# Patient Record
Sex: Male | Born: 1940 | Race: White | Hispanic: No | Marital: Married | Smoking: Current every day smoker
Health system: Southern US, Community
[De-identification: ages and names within clinical notes are randomized; demographics above are authoritative.]

## PROBLEM LIST (undated history)

## (undated) DIAGNOSIS — I2510 Atherosclerotic heart disease of native coronary artery without angina pectoris: Secondary | ICD-10-CM

## (undated) DIAGNOSIS — G4733 Obstructive sleep apnea (adult) (pediatric): Secondary | ICD-10-CM

## (undated) DIAGNOSIS — R7303 Prediabetes: Secondary | ICD-10-CM

## (undated) DIAGNOSIS — I4729 Other ventricular tachycardia: Secondary | ICD-10-CM

## (undated) DIAGNOSIS — H269 Unspecified cataract: Secondary | ICD-10-CM

## (undated) DIAGNOSIS — E785 Hyperlipidemia, unspecified: Secondary | ICD-10-CM

## (undated) DIAGNOSIS — I10 Essential (primary) hypertension: Secondary | ICD-10-CM

## (undated) DIAGNOSIS — I255 Ischemic cardiomyopathy: Secondary | ICD-10-CM

## (undated) DIAGNOSIS — I4821 Permanent atrial fibrillation: Secondary | ICD-10-CM

## (undated) DIAGNOSIS — M1990 Unspecified osteoarthritis, unspecified site: Secondary | ICD-10-CM

## (undated) DIAGNOSIS — I714 Abdominal aortic aneurysm, without rupture, unspecified: Secondary | ICD-10-CM

## (undated) DIAGNOSIS — K449 Diaphragmatic hernia without obstruction or gangrene: Secondary | ICD-10-CM

## (undated) DIAGNOSIS — M109 Gout, unspecified: Secondary | ICD-10-CM

## (undated) DIAGNOSIS — K219 Gastro-esophageal reflux disease without esophagitis: Secondary | ICD-10-CM

## (undated) DIAGNOSIS — I739 Peripheral vascular disease, unspecified: Secondary | ICD-10-CM

## (undated) DIAGNOSIS — I472 Ventricular tachycardia, unspecified: Secondary | ICD-10-CM

## (undated) DIAGNOSIS — E039 Hypothyroidism, unspecified: Secondary | ICD-10-CM

## (undated) DIAGNOSIS — N183 Chronic kidney disease, stage 3 unspecified: Secondary | ICD-10-CM

## (undated) DIAGNOSIS — Z952 Presence of prosthetic heart valve: Secondary | ICD-10-CM

## (undated) DIAGNOSIS — J449 Chronic obstructive pulmonary disease, unspecified: Secondary | ICD-10-CM

## (undated) DIAGNOSIS — H9190 Unspecified hearing loss, unspecified ear: Secondary | ICD-10-CM

## (undated) DIAGNOSIS — N281 Cyst of kidney, acquired: Secondary | ICD-10-CM

## (undated) HISTORY — DX: Abdominal aortic aneurysm, without rupture, unspecified: I71.40

## (undated) HISTORY — DX: Abdominal aortic aneurysm, without rupture: I71.4

## (undated) HISTORY — DX: Hyperlipidemia, unspecified: E78.5

## (undated) HISTORY — DX: Ischemic cardiomyopathy: I25.5

## (undated) HISTORY — PX: CARDIOVERSION: SHX1299

## (undated) HISTORY — DX: Essential (primary) hypertension: I10

## (undated) HISTORY — DX: Gastro-esophageal reflux disease without esophagitis: K21.9

## (undated) HISTORY — DX: Diaphragmatic hernia without obstruction or gangrene: K44.9

## (undated) HISTORY — DX: Obstructive sleep apnea (adult) (pediatric): G47.33

## (undated) HISTORY — PX: OTHER SURGICAL HISTORY: SHX169

## (undated) HISTORY — PX: AORTIC VALVE REPLACEMENT: SHX41

## (undated) HISTORY — DX: Chronic obstructive pulmonary disease, unspecified: J44.9

## (undated) HISTORY — DX: Unspecified cataract: H26.9

## (undated) HISTORY — DX: Prediabetes: R73.03

## (undated) HISTORY — DX: Gout, unspecified: M10.9

## (undated) HISTORY — DX: Cyst of kidney, acquired: N28.1

## (undated) HISTORY — DX: Peripheral vascular disease, unspecified: I73.9

## (undated) HISTORY — PX: FEMORAL BYPASS: SHX50

---

## 1998-10-18 HISTORY — PX: CORONARY ARTERY BYPASS GRAFT: SHX141

## 1998-11-10 ENCOUNTER — Ambulatory Visit (HOSPITAL_COMMUNITY): Admission: RE | Admit: 1998-11-10 | Discharge: 1998-11-10 | Payer: Self-pay | Admitting: Family Medicine

## 1998-11-10 ENCOUNTER — Encounter: Payer: Self-pay | Admitting: Internal Medicine

## 1999-02-05 ENCOUNTER — Ambulatory Visit (HOSPITAL_COMMUNITY): Admission: RE | Admit: 1999-02-05 | Discharge: 1999-02-05 | Payer: Self-pay | Admitting: Otolaryngology

## 1999-02-05 ENCOUNTER — Encounter: Payer: Self-pay | Admitting: Otolaryngology

## 1999-06-02 ENCOUNTER — Ambulatory Visit (HOSPITAL_COMMUNITY): Admission: RE | Admit: 1999-06-02 | Discharge: 1999-06-02 | Payer: Self-pay | Admitting: Internal Medicine

## 1999-06-02 ENCOUNTER — Encounter: Payer: Self-pay | Admitting: Internal Medicine

## 1999-06-05 ENCOUNTER — Ambulatory Visit (HOSPITAL_COMMUNITY): Admission: RE | Admit: 1999-06-05 | Discharge: 1999-06-05 | Payer: Self-pay | Admitting: Cardiology

## 1999-06-10 ENCOUNTER — Encounter: Payer: Self-pay | Admitting: Cardiothoracic Surgery

## 1999-06-10 ENCOUNTER — Inpatient Hospital Stay (HOSPITAL_COMMUNITY): Admission: RE | Admit: 1999-06-10 | Discharge: 1999-06-17 | Payer: Self-pay | Admitting: Cardiothoracic Surgery

## 1999-06-11 ENCOUNTER — Encounter: Payer: Self-pay | Admitting: Cardiothoracic Surgery

## 1999-06-12 ENCOUNTER — Encounter: Payer: Self-pay | Admitting: Cardiothoracic Surgery

## 1999-06-15 ENCOUNTER — Encounter: Payer: Self-pay | Admitting: Cardiothoracic Surgery

## 1999-06-23 ENCOUNTER — Encounter: Payer: Self-pay | Admitting: Internal Medicine

## 1999-06-23 ENCOUNTER — Ambulatory Visit (HOSPITAL_COMMUNITY): Admission: RE | Admit: 1999-06-23 | Discharge: 1999-06-23 | Payer: Self-pay | Admitting: Internal Medicine

## 1999-07-14 ENCOUNTER — Encounter (HOSPITAL_COMMUNITY): Admission: RE | Admit: 1999-07-14 | Discharge: 1999-10-12 | Payer: Self-pay | Admitting: Cardiology

## 1999-08-11 ENCOUNTER — Ambulatory Visit (HOSPITAL_COMMUNITY): Admission: RE | Admit: 1999-08-11 | Discharge: 1999-08-11 | Payer: Self-pay | Admitting: Internal Medicine

## 1999-08-11 ENCOUNTER — Encounter: Payer: Self-pay | Admitting: Internal Medicine

## 1999-10-26 ENCOUNTER — Encounter (HOSPITAL_COMMUNITY): Admission: RE | Admit: 1999-10-26 | Discharge: 2000-01-24 | Payer: Self-pay | Admitting: Cardiology

## 2000-01-25 ENCOUNTER — Encounter (HOSPITAL_COMMUNITY): Admission: RE | Admit: 2000-01-25 | Discharge: 2000-04-24 | Payer: Self-pay | Admitting: Cardiology

## 2000-03-21 ENCOUNTER — Emergency Department (HOSPITAL_COMMUNITY): Admission: EM | Admit: 2000-03-21 | Discharge: 2000-03-21 | Payer: Self-pay | Admitting: Emergency Medicine

## 2000-03-21 ENCOUNTER — Encounter: Payer: Self-pay | Admitting: Emergency Medicine

## 2000-05-23 ENCOUNTER — Encounter: Payer: Self-pay | Admitting: Vascular Surgery

## 2000-05-25 ENCOUNTER — Ambulatory Visit: Admission: RE | Admit: 2000-05-25 | Discharge: 2000-05-25 | Payer: Self-pay | Admitting: Vascular Surgery

## 2000-05-27 ENCOUNTER — Inpatient Hospital Stay (HOSPITAL_COMMUNITY): Admission: RE | Admit: 2000-05-27 | Discharge: 2000-05-28 | Payer: Self-pay | Admitting: Vascular Surgery

## 2000-07-18 ENCOUNTER — Ambulatory Visit (HOSPITAL_COMMUNITY): Admission: RE | Admit: 2000-07-18 | Discharge: 2000-07-18 | Payer: Self-pay | Admitting: Vascular Surgery

## 2000-07-21 ENCOUNTER — Encounter: Payer: Self-pay | Admitting: Vascular Surgery

## 2000-07-21 ENCOUNTER — Inpatient Hospital Stay: Admission: RE | Admit: 2000-07-21 | Discharge: 2000-07-24 | Payer: Self-pay | Admitting: Vascular Surgery

## 2001-06-05 ENCOUNTER — Encounter: Payer: Self-pay | Admitting: Internal Medicine

## 2001-06-05 ENCOUNTER — Ambulatory Visit (HOSPITAL_COMMUNITY): Admission: RE | Admit: 2001-06-05 | Discharge: 2001-06-05 | Payer: Self-pay | Admitting: Internal Medicine

## 2001-11-24 ENCOUNTER — Encounter: Payer: Self-pay | Admitting: Internal Medicine

## 2001-11-24 ENCOUNTER — Ambulatory Visit (HOSPITAL_COMMUNITY): Admission: RE | Admit: 2001-11-24 | Discharge: 2001-11-24 | Payer: Self-pay | Admitting: Internal Medicine

## 2001-11-24 ENCOUNTER — Emergency Department (HOSPITAL_COMMUNITY): Admission: EM | Admit: 2001-11-24 | Discharge: 2001-11-24 | Payer: Self-pay | Admitting: Emergency Medicine

## 2002-02-16 ENCOUNTER — Encounter: Admission: RE | Admit: 2002-02-16 | Discharge: 2002-02-16 | Payer: Self-pay | Admitting: Cardiology

## 2002-02-16 ENCOUNTER — Encounter: Payer: Self-pay | Admitting: Cardiology

## 2002-02-19 ENCOUNTER — Ambulatory Visit (HOSPITAL_COMMUNITY): Admission: RE | Admit: 2002-02-19 | Discharge: 2002-02-20 | Payer: Self-pay | Admitting: Cardiology

## 2002-08-16 ENCOUNTER — Encounter: Payer: Self-pay | Admitting: Gastroenterology

## 2002-08-16 ENCOUNTER — Ambulatory Visit (HOSPITAL_COMMUNITY): Admission: RE | Admit: 2002-08-16 | Discharge: 2002-08-16 | Payer: Self-pay | Admitting: Gastroenterology

## 2002-08-17 ENCOUNTER — Ambulatory Visit (HOSPITAL_COMMUNITY): Admission: RE | Admit: 2002-08-17 | Discharge: 2002-08-17 | Payer: Self-pay | Admitting: Gastroenterology

## 2002-08-17 ENCOUNTER — Encounter: Payer: Self-pay | Admitting: Gastroenterology

## 2003-01-22 ENCOUNTER — Encounter: Payer: Self-pay | Admitting: Cardiology

## 2003-01-22 ENCOUNTER — Inpatient Hospital Stay (HOSPITAL_COMMUNITY): Admission: AD | Admit: 2003-01-22 | Discharge: 2003-01-30 | Payer: Self-pay | Admitting: Cardiology

## 2004-02-17 ENCOUNTER — Ambulatory Visit (HOSPITAL_COMMUNITY): Admission: RE | Admit: 2004-02-17 | Discharge: 2004-02-17 | Payer: Self-pay | Admitting: Internal Medicine

## 2004-05-20 ENCOUNTER — Encounter: Admission: RE | Admit: 2004-05-20 | Discharge: 2004-05-20 | Payer: Self-pay | Admitting: Cardiology

## 2004-05-27 ENCOUNTER — Inpatient Hospital Stay (HOSPITAL_COMMUNITY): Admission: AD | Admit: 2004-05-27 | Discharge: 2004-05-29 | Payer: Self-pay | Admitting: Cardiology

## 2005-08-03 ENCOUNTER — Inpatient Hospital Stay (HOSPITAL_COMMUNITY): Admission: EM | Admit: 2005-08-03 | Discharge: 2005-08-16 | Payer: Self-pay | Admitting: Emergency Medicine

## 2005-08-04 ENCOUNTER — Encounter: Payer: Self-pay | Admitting: Gastroenterology

## 2005-08-05 ENCOUNTER — Ambulatory Visit: Payer: Self-pay | Admitting: Gastroenterology

## 2006-09-26 ENCOUNTER — Emergency Department (HOSPITAL_COMMUNITY): Admission: EM | Admit: 2006-09-26 | Discharge: 2006-09-26 | Payer: Self-pay | Admitting: Emergency Medicine

## 2006-10-28 ENCOUNTER — Ambulatory Visit: Admission: RE | Admit: 2006-10-28 | Discharge: 2006-10-28 | Payer: Self-pay | Admitting: Internal Medicine

## 2007-04-18 ENCOUNTER — Observation Stay (HOSPITAL_COMMUNITY): Admission: EM | Admit: 2007-04-18 | Discharge: 2007-04-19 | Payer: Self-pay | Admitting: Emergency Medicine

## 2007-05-23 ENCOUNTER — Ambulatory Visit: Payer: Self-pay | Admitting: Vascular Surgery

## 2007-07-03 ENCOUNTER — Ambulatory Visit (HOSPITAL_COMMUNITY): Admission: RE | Admit: 2007-07-03 | Discharge: 2007-07-03 | Payer: Self-pay | Admitting: Internal Medicine

## 2007-07-26 ENCOUNTER — Ambulatory Visit: Payer: Self-pay | Admitting: Gastroenterology

## 2007-08-09 ENCOUNTER — Ambulatory Visit (HOSPITAL_COMMUNITY): Admission: RE | Admit: 2007-08-09 | Discharge: 2007-08-09 | Payer: Self-pay | Admitting: Gastroenterology

## 2007-08-09 ENCOUNTER — Encounter: Payer: Self-pay | Admitting: Gastroenterology

## 2007-08-21 ENCOUNTER — Ambulatory Visit: Payer: Self-pay | Admitting: Gastroenterology

## 2007-12-26 DIAGNOSIS — K222 Esophageal obstruction: Secondary | ICD-10-CM | POA: Insufficient documentation

## 2007-12-26 DIAGNOSIS — D126 Benign neoplasm of colon, unspecified: Secondary | ICD-10-CM

## 2007-12-26 DIAGNOSIS — I2510 Atherosclerotic heart disease of native coronary artery without angina pectoris: Secondary | ICD-10-CM

## 2007-12-26 DIAGNOSIS — E782 Mixed hyperlipidemia: Secondary | ICD-10-CM | POA: Insufficient documentation

## 2007-12-26 DIAGNOSIS — I10 Essential (primary) hypertension: Secondary | ICD-10-CM

## 2008-05-28 ENCOUNTER — Ambulatory Visit: Payer: Self-pay | Admitting: Vascular Surgery

## 2008-11-22 ENCOUNTER — Encounter: Admission: RE | Admit: 2008-11-22 | Discharge: 2008-11-22 | Payer: Self-pay | Admitting: Cardiology

## 2008-11-25 ENCOUNTER — Inpatient Hospital Stay (HOSPITAL_COMMUNITY): Admission: AD | Admit: 2008-11-25 | Discharge: 2008-11-26 | Payer: Self-pay | Admitting: Cardiology

## 2008-11-25 HISTORY — PX: CARDIAC CATHETERIZATION: SHX172

## 2011-02-02 LAB — BASIC METABOLIC PANEL
BUN: 23 mg/dL (ref 6–23)
CO2: 26 mEq/L (ref 19–32)
Calcium: 8.7 mg/dL (ref 8.4–10.5)
Chloride: 106 mEq/L (ref 96–112)
Creatinine, Ser: 1.5 mg/dL (ref 0.4–1.5)
Potassium: 4.3 mEq/L (ref 3.5–5.1)
Sodium: 138 mEq/L (ref 135–145)

## 2011-02-02 LAB — CBC
HCT: 37.1 % — ABNORMAL LOW (ref 39.0–52.0)
MCHC: 34.1 g/dL (ref 30.0–36.0)
MCV: 90.5 fL (ref 78.0–100.0)
RBC: 4.1 MIL/uL — ABNORMAL LOW (ref 4.22–5.81)

## 2011-02-02 LAB — PROTIME-INR: Prothrombin Time: 14.5 seconds (ref 11.6–15.2)

## 2011-03-02 NOTE — H&P (Signed)
NAME:  Robert Stanton, Robert Stanton NO.:  [id number]   MEDICAL RECORD NO.:  [id number]          PATIENT TYPE:  INP   LOCATION:  6702                         FACILITY:  [facility]   PHYSICIAN:  Hettie Holstein, D.O.    DATE OF BIRTH:  15-Sep-1941   DATE OF ADMISSION:  04/18/2007  DATE OF DISCHARGE:                              HISTORY & PHYSICAL   PRIMARY CARE PHYSICIAN:  Dr. Milinda Cave.   CARDIOLOGIST:  Dr. Caprice Kluver.   GASTROENTEROLOGIST:  Dr. Arlyce Dice.   CHIEF COMPLAINT:  Bright red blood per rectum.   HISTORY OF PRESENTING ILLNESS:  Robert Stanton is a 70 year old  male with a history of chronic anticoagulation due to a St. Jude's  prosthetic valve.  He has had a previous history of gastrointestinal  bleeding back in 2006, where he was discovered to have gastric ulcers.  His bleeding subsided.  His gastroenterologist stopped his antiplatelet  therapy.  He had been in his usual state of health.  He reports today  while at work he had a small amount of blood around 4 p.m., bright red  in discoloration noticed on his stool.  He was not having profuse  diarrhea and did not fill the commode with blood.  He had a reoccurrence  at 7:30 p.m.; again, this did not fill that commode; it was just bright  red blood on the toilet paper and stool.  He is on chronic Coumadin  therapy.  His last INR was 2.1 on the 14th.  Otherwise, he denies  nausea, vomiting or any abdominal pain.  Previous episode of bright red  blood per rectum was black and tarry, at which time he was discovered to  have gastric ulcers.  In the emergency department, he is hemodynamically  stable.  His INR is 3.1 and his hemoglobin  is 12.5.   PAST MEDICAL HISTORY:  As noted above, quite extensive, status post  aortic valve replacement, St. Jude, also a history of coronary artery  disease.  He has had a stent placed in his SVG to his RCA in May 2003.  Cardiac catheterization in 2005 by Dr. Caprice Kluver reveals loss of SVG.  He had a widely patent graft to his LAD and his circumflex appeared  without disease and there were some left-to-right collaterals the distal  right coronary artery; this was in August 3005.  Ejection fraction that  time was 54%.  He has a history of chronic claudication, followed by Dr.  Hart Rochester, history of hiatal hernia, history of abdominal aortic aneurysm,  COPD, gout, hyperlipidemia, chronic tobacco abuse.   MEDICATIONS:  He takes only:  1. Cartia XT 180 mg daily.  2. Buprenex only on a p.r.n. basis.  3. Klonopin on a p.r.n. basis only.  4. Coumadin 7.5 mg daily.  5. Crestor 20 mg daily.  6. A medication called Triglide.   ALLERGIES:  NO KNOWN DRUG ALLERGIES.   SOCIAL HISTORY:  He lives at home with his wife and 2 grandchildren.  He  denies alcohol.  He smokes about 1-1/2 packs of cigarettes per day.  He  walks his dog for  exercise.  He continues to work.   FAMILY HISTORY:  Noncontributory.   REVIEW OF SYSTEMS:  He had been his usual state of health without  reports of any GI symptomatology until recently.  Further review of  systems unremarkable.   PHYSICAL EXAMINATION:  VITAL SIGNS:  Blood pressure 159/78, heart rate  60, O2 saturation 98%, temperature is 98.0.  GENERAL:  The patient is alert, awake, in no acute distress.  HEENT:  Reveals his head to be normocephalic, atraumatic.  Extraocular  muscles are intact.  NECK:  Supple and nontender.  No palpable thyromegaly or mass.  CARDIOVASCULAR:  Exam reveals normal S1 and S2 with appreciable loud  prosthetic sounds.  ABDOMEN:  Soft without rebound or guarding.  LOWER EXTREMITIES:  Reveal with chronic stasis appearance, no edema.   ASSESSMENT:  1. Gastrointestinal bleed, suspect lower.  Currently, this has      remained without recurrence while in the emergency department.  2. Chronic Coumadin therapy.  3. Prosthetic aortic valve/St. Jude.  4. Coronary artery disease.  5. History of gastrointestinal bleed due to gastric  ulcers.  6. Tobacco dependence.  7. Chronic obstructive pulmonary disease.  8. Hypertension.  9. Claudication.  10.Hypercholesterolemia.   PLAN:  At this time, [name] will be admitted for observation.  Will follow his clinical course on telemetry, follows his hemoglobin and  hematocrits.  For now, we will hold his Coumadin and initiate heparin if  his INR is allowed to drift below 2.0.  Otherwise, he is therapeutic on  the regimen.  We will follow for reoccurrence and stability of his  hemoglobin and hematocrit; if these appear to reveal worsening or  ongoing bleeding, Gastroenterology will need to be involved      Hettie Holstein, D.O.  Electronically Signed     ESS/MEDQ  D:  04/18/2007  T:  04/18/2007  Job:  [id number]   cc:   Jeoffrey Massed, MD  Thereasa Solo. Little, M.D.  Barbette Hair. Arlyce Dice, MD,FACG

## 2011-03-02 NOTE — Discharge Summary (Signed)
NAME:  Robert Stanton, Robert Stanton NO.:  [id number]   MEDICAL RECORD NO.:  [id number]          PATIENT TYPE:  INP   LOCATION:  3729                         FACILITY:  [facility]   PHYSICIAN:  Thereasa Solo. Little, M.D. DATE OF BIRTH:  08/26/1941   DATE OF ADMISSION:  11/25/2008  DATE OF DISCHARGE:  11/26/2008                               DISCHARGE SUMMARY   DISCHARGE DIAGNOSES:  1. Chest pain consistent with angina, catheterization this admission,      plan is for continued medical therapy.  2. Coronary artery disease with coronary artery bypass grafting in      2000, saphenous vein grafts by catheterization in 2005 were      occluded with a patent left internal mammary artery at that time,      there is no change at catheterization this admission.  3. History of St. Jude aortic valve.  4. Chronic Coumadin therapy.  5. Chronic obstructive pulmonary disease.  6. Treated hypertension.  7. Treated hyperlipidemia.  8. Hard of hearing.   HOSPITAL COURSE:  The patient is a 70 year old male followed by Dr.  Milinda Cave and Dr. Clarene Duke.  He has had previous bypass surgery and St. Jude  aortic valve replacement.  In 2005, he had a catheterization showing  occlusion of the vein grafts with a patent LIMA to the LAD with  collaterals to the right and mild disease in the native circumflex.  He  was treated medically.  He was seen in the office on November 21, 2008.  He had been having increasing chest discomfort and shortness of breath  with exertion.  Symptoms were worrisome for unstable angina.  He was  taken off Coumadin and put on Lovenox as an outpatient and set up for  diagnostic catheterization.  This was done on November 25, 2008.  Catheterization revealed patent LIMA to the LAD, occluded SVG to the  RCA, occluded SVG to the OM with 40-50% native circ unchanged from the  previous catheterization, and a 40-50% left main.  Dr. Clarene Duke did not  feel that his anatomy explained these symptoms.   Myoview was done on  November 26, 2008, to evaluate for ischemia, this showed no evidence of  ischemia.  Plan is for continued medical therapy.  We have added low-  dose nitrate.  He will be discharged later on November 26, 2008.  He will  need Lovenox to Coumadin crossover as an outpatient.   LABORATORY DATA:  White count 6.4, hemoglobin 12.6, hematocrit 37.1, and  platelets 209.  INR is 1.1 at discharge.  Sodium 138, potassium 4.3, BUN  23, and creatinine 1.5.  Chest x-ray on the 5th shows no acute process.  Myoview shows no ischemia with evidence of prior inferior wall infarct  and an EF of 45%.   DISCHARGE MEDICATIONS:  1. Cartia XT 180 mg a day.  2. Pepcid AC 10 mg a day.  3. Lisinopril 20 mg a day.  4. Fenofibrate 134 mg a day.  5. Crestor 40 mg a day.  6. Clonazepam 0.5 mg nightly.  7. Coumadin as directed, his home dose is 5 mg 2  times a week and 7.5      mg other days, usually his protime is followed by Dr. Milinda Cave.   Other medications will include:  1. Lovenox 100 mg subcu b.i.d. until his INR is therapeutic.  2. Citalopram 40 mg a day.  3. Loratadine 10 mg a day.  4. Imdur 30 mg a day.  5. Nitroglycerin sublingual p.r.n.   DISPOSITION:  The patient is discharged in stable condition and will  follow up with Dr. Clarene Duke and Dr. Milinda Cave.  He will have a protime in  our office on Thursday and once his INR is therapeutic, he will be  turned back over to Dr. Samul Dada office.      Abelino Derrick, P.A.    ______________________________  Thereasa Solo. Little, M.D.    Lenard Lance  D:  11/26/2008  T:  11/27/2008  Job:  [id number]   cc:   Jeoffrey Massed, MD

## 2011-03-02 NOTE — Cardiovascular Report (Signed)
NAME:  Robert Stanton, Robert Stanton NO.:  [id number]   MEDICAL RECORD NO.:  [id number]          PATIENT TYPE:  INP   LOCATION:  3729                         FACILITY:  [facility]   PHYSICIAN:  Thereasa Solo. Little, M.D. DATE OF BIRTH:  1941-02-27   DATE OF PROCEDURE:  11/25/2008  DATE OF DISCHARGE:                            CARDIAC CATHETERIZATION   This 70 year old male has a history of aortic valve replacement and  bypass surgery in 2002.  He had a cath in 2003 and again in 2005.  In  2005, he had lost both of the saphenous vein grafts, but still had a  widely patent internal mammary artery to the LAD.  He presented as a  walk-in from Dr. [name] office because of recurrent chest pain that  was nitroglycerin responsive.  His EKG was unremarkable.  He was placed  on medical therapy and comes in for outpatient cardiac catheterization.   He has a mechanical aortic valve (St. Jude).  His Coumadin was held.  He  has been on Lovenox.  His dose of Lovenox was about 10 hours ago.   After obtaining informed consent, the patient was prepped and draped in  the usual sterile fashion exposing the right groin.  Following local  anesthetic with 1% Xylocaine, the Seldinger technique was employed and a  5-French introducer sheath was placed in the right femoral artery.  The  patient had a fem-fem bypass graft.  The sheath actually extended up the  femoral artery and into the fem-fem graft.  I was able with a Glidewire  to pull the sheath back, cannulate, and go up the native iliac.  There  was a stent in the iliac artery.  We were able to negotiate a catheter  through the stent and complete the procedure.  Long J-wire was used for  catheter exchange.  No ventriculogram was performed because of his  mechanical aortic valve.   COMPLICATIONS:  None.   TOTAL CONTRAST:  125 mL.   EQUIPMENT:  A 5-French Judkins configuration catheters and allografts  including the internal mammary artery  subselectively engaged with a  right coronary catheter.   RESULTS:  1. Central aortic pressure was 130/56.  2. The stent in the iliac was well visualized and the fem-fem graft      was visualized with a hand injection through the sheath, it      appeared to be widely patent.   Coronary arteriography:  1. Grafts.  The internal mammary artery to the LAD was widely patent.      The LAD was well visualized, had only minimal irregularities.      There was some collateral visualization of both the small OM and      the right coronary artery.  2. Saphenous vein graft to the RCA, 100% occluded at its ostium.  3. Saphenous vein graft to OM, 100% occluded at its ostium.  4. Native right coronary artery, 100% occluded proximally.  5. Left main.  There was terminal 40-50% narrowing of the left main      noted in the LAO cranial view only.  When compared to the  2005      cardiac cath, this has not significantly changed.  6. LAD.  The LAD system gives rise to a first diagonal branch that is      large and free of significant disease.  The LAD itself is 100%      occluded right after the diagonal, but is well visualized via the      internal mammary artery to the LAD.  There was a large left-to-      right collateral bed noted from the left circulation.  7. Circumflex.  There appeared to be some ostial narrowing of the      circumflex and it was mildly hyperlucent (40-50%).  This too is      unchanged from the 2005 cath.  OM #1 had proximal 30% and 40% areas      of narrowing and the distal OM bifurcated once free of disease.  OM      #2 was also a bifurcating vessel and free of disease.  There were      large left-to-right collaterals.   CONCLUSION:  Loss of 2/3 bypass grafts with patent internal mammary  artery to the LAD.  There is large collateral filling of the RCA via  both the native and the internal mammary artery to the LAD.   The only areas that could explain his pain would either be  related to  the left main or the ostial circ, but this does not appear to have  changed angiographically since 2005.   I plan to restart the Lovenox in about 3-4 hours after the sheath is  removed.  We will also give him a dose of 7-1/2 mg of Coumadin tonight  (because of his mechanical valve).   I have scheduled him for Persantine Cardiolite in the morning.  If it is  positive for either inferior or lateral ischemia, I would consider re-  evaluation by CVTS for repeat bypass surgery since the left main could  in fact cause ischemia in the distribution of the diagonal and the  circumflex and the right coronary artery collaterals are dependent on  these vessels.   We would need a 2D echocardiogram if surgery is entertained to evaluate  for LV function and the appearance of his mechanical valve.           ______________________________  Thereasa Solo. Little, M.D.     ABL/MEDQ  D:  11/25/2008  T:  11/26/2008  Job:  [id number]   cc:   Lucky Cowboy, M.D.

## 2011-03-02 NOTE — Assessment & Plan Note (Signed)
[facility]                         GASTROENTEROLOGY OFFICE NOTE   Kamel, Haven CHRYSTOPHER STANGL                   MRN:          [id number]  DATE:07/26/2007                            DOB:          1941-05-03    REASON FOR CONSULTATION:  Rectal bleeding.   Robert Stanton is a 69 year old white male referred through the courtesy  of Dr. Oneta Rack for evaluation.  He has noted bright red blood  surrounding his stools and on the toilet tissue.  He denies abdominal or  rectal pain.  In July 2008 he was hospitalized for an acute GI bleed.  He is on Coumadin because of a prosthetic heart valve.  No workup was  done at that time.  His last colonoscopy was in 2003.  This was an  incomplete exam due to severe spasm in the left colon.  He has  diverticulosis.  He is on no gastric irritants, including nonsteroidals.  He denies change in bowel habits.   PAST MEDICAL HISTORY:  Pertinent for:  1. St. Jude aortic valve.  2. He has coronary artery disease.  3. He is status post CABG with stent placement.  4. He has chronic claudication.  5. He has fatty liver.  6. Hypertension.  7. COPD.  8. He is status post MI.  9. He also has had vascular surgery with femoral bypass.  10.He has sleep apnea.   MEDICATIONS:  Include Cartia XT, Coumadin, Crestor, Triglide,  lisinopril.   HE HAS NO ALLERGIES.   He smokes a pack a day.  He does not drink.  He is married and retired.   REVIEW OF SYSTEMS:  Positive for some loss of hearing.   EXAMINATION:  Pulse 68.  Blood pressure 142/60.  Weight 228.  HEENT: EOMI.  PERRLA.  Sclerae are anicteric.  Conjunctivae are pink.  NECK:  Supple without thyromegaly, adenopathy or carotid bruits.  CHEST:  Clear to auscultation and percussion without adventitious  sounds.  CARDIAC:  Regular rhythm; normal S1 S2.  There are no murmurs, gallops  or rubs.  ABDOMEN:  Bowel sounds are normoactive.  Abdomen is soft, nontender and  nondistended.   There are no abdominal masses, tenderness, splenic  enlargement or hepatomegaly.  EXTREMITIES:  Full range of motion.  No cyanosis, clubbing or edema.  RECTAL:  Stool is brown, but Hemoccult positive.   IMPRESSION:  1. Persistent limited rectal bleeding.  Bleeding sources including      polyps, AVMs, hemorrhoids, and neoplasm ought to be ruled out.  2. On Coumadin therapy for prosthetic aortic valve.  3. Coronary artery disease.  4. Chronic obstructive pulmonary disease.  5. Hypertension.  6. Peripheral vascular disease.   RECOMMENDATIONS:  Colonoscopy.  If hemorrhoids only are seen, I will  consider band ligation.  He will be placed on a Lovenox window while  Coumadin is being held, and receive antibiotic prophylaxis for the  procedure.     Barbette Hair. Arlyce Dice, MD,FACG  Electronically Signed    RDK/MedQ  DD: 07/26/2007  DT: 07/26/2007  Job #: [id number]   cc:   Lucky Cowboy, M.D.

## 2011-06-15 ENCOUNTER — Ambulatory Visit (HOSPITAL_COMMUNITY)
Admission: RE | Admit: 2011-06-15 | Discharge: 2011-06-15 | Disposition: A | Discharge status: Home / Self Care | Payer: Medicare HMO | Source: Ambulatory Visit | Attending: Cardiology | Admitting: Cardiology

## 2011-06-15 DIAGNOSIS — Z951 Presence of aortocoronary bypass graft: Secondary | ICD-10-CM | POA: Insufficient documentation

## 2011-06-15 DIAGNOSIS — Z954 Presence of other heart-valve replacement: Secondary | ICD-10-CM | POA: Insufficient documentation

## 2011-06-15 DIAGNOSIS — I4891 Unspecified atrial fibrillation: Secondary | ICD-10-CM | POA: Insufficient documentation

## 2011-06-15 LAB — PROTIME-INR: Prothrombin Time: 31.7 seconds — ABNORMAL HIGH (ref 11.6–15.2)

## 2011-06-20 NOTE — Cardiovascular Report (Signed)
  NAME:  Robert Stanton, Robert Stanton NO.:  [id number]  MEDICAL RECORD NO.:  [id number]  LOCATION:  [facility]                         FACILITY:  [facility]  PHYSICIAN:  Thereasa Solo. Little, M.D. DATE OF BIRTH:  20-Oct-1940  DATE OF PROCEDURE:  06/15/2011 DATE OF DISCHARGE:  06/15/2011                           CARDIAC CATHETERIZATION   INDICATIONS:  This 70 year old male had both aortic valve replacements with St. Jude mechanical valve and coronary bypass surgery in 2002.  His most recent functional study showed an ejection fraction of approximately 45%, and an echocardiogram from May 13, 2011, shows left atrial dimension to be 4.8 cm.  He has been in atrial fibrillation for an unknown length of time.  He has a small drop in his exercise tolerance, but has no awareness of the arrhythmia itself.  His INR has been therapeutic for the last 4 weeks with today's INR being 3.0.  He has a history of obstructive sleep apnea.  PROCEDURE IN DETAIL:  After obtaining informed consent, the patient was given 100 mg __________; and after appropriate level of the anesthesia was obtained, he underwent cardioversion using anterior-posterior pads. A single shock of 120-watt seconds converted him from atrial fibrillation with a rate of 62 into a sinus rhythm with a rate of 68.  He will be discharged to home later this afternoon.  His medications will stay the same.  He has a followup appointment in our office later this week.          ______________________________ Thereasa Solo. Little, M.D.     ABL/MEDQ  D:  06/15/2011  T:  06/15/2011  Job:  [id number]  cc:   Lucky Cowboy, M.D.  Electronically Signed by Julieanne Manson M.D. on 06/20/2011 02:31:01 PM

## 2011-08-03 LAB — CBC
HCT: 37.6 — ABNORMAL LOW
Hemoglobin: 12.5 — ABNORMAL LOW
MCHC: 33.2
MCV: 87.8
Platelets: 270
RBC: 4.31
RDW: 15.4 — ABNORMAL HIGH

## 2011-08-03 LAB — I-STAT 8, (EC8 V) (CONVERTED LAB)
BUN: 25 — ABNORMAL HIGH
Glucose, Bld: 105 — ABNORMAL HIGH
HCT: 40
Hemoglobin: 13.6
Operator id: 151321
Potassium: 3.8
pCO2, Ven: 47.6
pH, Ven: 7.351 — ABNORMAL HIGH

## 2011-08-03 LAB — PROTIME-INR
INR: 2.3 — ABNORMAL HIGH
INR: 3.1 — ABNORMAL HIGH
Prothrombin Time: 26.5 — ABNORMAL HIGH
Prothrombin Time: 33.7 — ABNORMAL HIGH

## 2011-08-03 LAB — HEMOGLOBIN AND HEMATOCRIT, BLOOD
HCT: 36.3 — ABNORMAL LOW
Hemoglobin: 12.1 — ABNORMAL LOW
Hemoglobin: 12.2 — ABNORMAL LOW

## 2011-08-03 LAB — APTT: aPTT: 50 — ABNORMAL HIGH

## 2011-08-03 LAB — POCT I-STAT CREATININE: Operator id: 151321

## 2011-08-03 LAB — BASIC METABOLIC PANEL
Creatinine, Ser: 1.25
GFR calc Af Amer: >60
Sodium: 141

## 2012-03-03 ENCOUNTER — Other Ambulatory Visit: Payer: Self-pay | Admitting: Internal Medicine

## 2012-03-03 DIAGNOSIS — R197 Diarrhea, unspecified: Secondary | ICD-10-CM

## 2012-03-03 DIAGNOSIS — R52 Pain, unspecified: Secondary | ICD-10-CM

## 2012-03-08 ENCOUNTER — Ambulatory Visit
Admission: RE | Admit: 2012-03-08 | Discharge: 2012-03-08 | Disposition: A | Discharge status: Home / Self Care | Payer: Self-pay | Source: Ambulatory Visit | Attending: Internal Medicine | Admitting: Internal Medicine

## 2012-03-08 DIAGNOSIS — R197 Diarrhea, unspecified: Secondary | ICD-10-CM

## 2012-03-08 DIAGNOSIS — R52 Pain, unspecified: Secondary | ICD-10-CM

## 2012-09-12 ENCOUNTER — Telehealth: Payer: Self-pay | Admitting: Gastroenterology

## 2012-09-12 NOTE — Telephone Encounter (Signed)
Pts son states that pt had labs done at Dr. [name] office today and he had a low Hgb. States they were told to be seen by Dr. Arlyce Dice. Pt scheduled to see Dr. Arlyce Dice 09/21/12 @3 :15pm. Son aware of appt date and time. Called Dr. [name] office for labs and notes.

## 2012-09-21 ENCOUNTER — Ambulatory Visit (INDEPENDENT_AMBULATORY_CARE_PROVIDER_SITE_OTHER): Payer: Medicare Other | Admitting: Gastroenterology

## 2012-09-21 ENCOUNTER — Encounter: Payer: Self-pay | Admitting: Gastroenterology

## 2012-09-21 VITALS — BP 108/58 | HR 51 | Ht 67.5 in | Wt 227.2 lb

## 2012-09-21 DIAGNOSIS — D649 Anemia, unspecified: Secondary | ICD-10-CM

## 2012-09-21 NOTE — Patient Instructions (Addendum)
You will need to go to the basement today for your hemoccult kit Please return within 2 weeks  You have been scheduled for an endoscopy with propofol. Please follow written instructions given to you at your visit today. If you use inhalers (even only as needed) or a CPAP machine, please bring them with you on the day of your procedure.

## 2012-09-21 NOTE — Progress Notes (Signed)
History of Present Illness: 71 year old white male with history of prosthetic heart valve, on Coumadin, COPD, coronary artery disease and bleeding ulcers referred for evaluation of drop in hemoglobin. In October hemoglobin was 12.2. On routine check  one month later hemoglobin was 11.0. He has no GI complaints including abdominal pain, melena or hematochezia. He is on no gastric irritants including nonsteroidals. Upper endoscopy in 2006 was normal. In 2008 patient underwent colonoscopy where a sigmoid polyp was removed. Specimen was lost. Internal hemorrhoids were banded. CT scan in May, 2013 demonstrated a moderate sized hiatal hernia. Renal cysts were also seen.    Past Medical History  Diagnosis Date  . Hypertension   . History of artificial heart valve   . Kidney cysts   . Cataracts, bilateral   . Hyperlipidemia   . COPD (chronic obstructive pulmonary disease)    Past Surgical History  Procedure Date  . Stents in kidneys   . Triple bypass   . Shunts in femoral arteries    family history includes Cancer in his brother; Heart attack in his father and mother; Kidney cancer in his sister; Kidney disease in his sister; and Lung cancer in his sister. Current Outpatient Prescriptions  Medication Sig Dispense Refill  . allopurinol (ZYLOPRIM) 300 MG tablet Take 300 mg by mouth daily.       [name] azithromycin (ZITHROMAX) 250 MG tablet Take 250 mg by mouth daily.       . Cholecalciferol (VITAMIN D3) 10000 UNITS capsule Take 10,000 Units by mouth daily.      . citalopram (CELEXA) 40 MG tablet Take 40 mg by mouth daily.       . clonazePAM (KLONOPIN) 1 MG tablet Take 1 mg by mouth at bedtime as needed.       . CRESTOR 40 MG tablet Take 40 mg by mouth daily.       [name] diltiazem (CARDIZEM CD) 180 MG 24 hr capsule Take 180 mg by mouth daily.       [name] diltiazem (DILACOR XR) 180 MG 24 hr capsule Take 180 mg by mouth daily.      . fenofibrate micronized (LOFIBRA) 134 MG capsule Take 134 mg by mouth daily  before breakfast.       . ipratropium-albuterol (DUONEB) 0.5-2.5 (3) MG/3ML SOLN 3 mLs every 6 (six) hours as needed.       . isosorbide mononitrate (IMDUR) 30 MG 24 hr tablet       . lisinopril (PRINIVIL,ZESTRIL) 20 MG tablet Take 20 mg by mouth daily.       . Multiple Vitamins-Minerals (MULTIVITAMIN PO) Take by mouth.      [name] PACERONE 200 MG tablet       . warfarin (COUMADIN) 5 MG tablet Take 5 mg by mouth daily.        Allergies as of 09/21/2012 - Review Complete 09/21/2012  Allergen Reaction Noted  . Simvastatin  09/21/2012    reports that he has been smoking.  He has never used smokeless tobacco. He reports that he does not drink alcohol or use illicit drugs.     Review of Systems: Pertinent positive and negative review of systems were noted in the above HPI section. All other review of systems were otherwise negative.  Vital signs were reviewed in today's medical record Physical Exam: General: Well developed , well nourished, no acute distress Head: Normocephalic and atraumatic Eyes:  sclerae anicteric, EOMI Ears: Normal auditory acuity Mouth: No deformity or lesions Neck: Supple, no masses or  thyromegaly Lungs: Clear throughout to auscultation Heart: Regular rate and rhythm; no murmurs, rubs or bruits Abdomen: Soft, non tender and non distended. No masses, noted.  Liver is palpable 2 fingerbreadths below the right costal margin and percusses to 13 cm. Normal Bowel sounds Rectal:deferred Musculoskeletal: Symmetrical with no gross deformities  Skin: No lesions on visible extremities Pulses:  Normal pulses noted Extremities: No clubbing, cyanosis,  or deformities noted. There is trace pedal edema Neurological: Alert oriented x 4, grossly nonfocal Cervical Nodes:  No significant cervical adenopathy Inguinal Nodes: No significant inguinal adenopathy Psychological:  Alert and cooperative. Normal mood and affect

## 2012-09-21 NOTE — Assessment & Plan Note (Signed)
The patient has had a recent drop in hemoglobin without overt bleeding. He is on anticoagulation therapy (Coumadin). A large hiatal hernia was noted by CT scan. He has a remote history of bleeding peptic ulcers. If he is bleeding, Sheria Lang erosions are a possibility as well is active peptic disease. Occult bleeding from the colon is also a consideration.  Recommendations #1 upper endoscopy #2 Hemoccults #3 to consider colonoscopy if upper endoscopy is unrevealing and Hemoccults are positive

## 2012-10-06 ENCOUNTER — Other Ambulatory Visit (INDEPENDENT_AMBULATORY_CARE_PROVIDER_SITE_OTHER): Payer: Medicare Other

## 2012-10-06 DIAGNOSIS — D649 Anemia, unspecified: Secondary | ICD-10-CM

## 2012-10-06 LAB — FECAL OCCULT BLOOD, IMMUNOCHEMICAL: Fecal Occult Bld: POSITIVE

## 2012-11-09 ENCOUNTER — Encounter: Payer: Self-pay | Admitting: Gastroenterology

## 2012-11-09 ENCOUNTER — Ambulatory Visit (AMBULATORY_SURGERY_CENTER): Payer: Medicare Other | Admitting: Gastroenterology

## 2012-11-09 VITALS — BP 147/74 | HR 58 | Temp 97.9°F | Resp 24 | Ht 67.0 in | Wt 227.0 lb

## 2012-11-09 DIAGNOSIS — D649 Anemia, unspecified: Secondary | ICD-10-CM

## 2012-11-09 DIAGNOSIS — K2960 Other gastritis without bleeding: Secondary | ICD-10-CM

## 2012-11-09 DIAGNOSIS — K222 Esophageal obstruction: Secondary | ICD-10-CM

## 2012-11-09 DIAGNOSIS — K2990 Gastroduodenitis, unspecified, without bleeding: Secondary | ICD-10-CM

## 2012-11-09 DIAGNOSIS — K2970 Gastritis, unspecified, without bleeding: Secondary | ICD-10-CM

## 2012-11-09 MED ORDER — PANTOPRAZOLE SODIUM 40 MG PO TBEC: 40.0000 mg | DELAYED_RELEASE_TABLET | Freq: Every day | ORAL | Status: DC

## 2012-11-09 MED ORDER — SODIUM CHLORIDE 0.9 % IV SOLN: 500.0000 mL | INTRAVENOUS | Status: DC

## 2012-11-09 NOTE — Progress Notes (Signed)
VSS, A&O x3, Pleased with Anesthesia care. Report to [employer]. DRM

## 2012-11-09 NOTE — Progress Notes (Addendum)
Patient did not have preoperative order for IV antibiotic SSI prophylaxis. (G8918)  Patient did not experience any of the following events: a burn prior to discharge; a fall within the facility; wrong site/side/patient/procedure/implant event; or a hospital transfer or hospital admission upon discharge from the facility. (G8907)  

## 2012-11-09 NOTE — Patient Instructions (Addendum)
YOU HAD AN ENDOSCOPIC PROCEDURE TODAY AT THE [facility]: Refer to the procedure report that was given to you for any specific questions about what was found during the examination.  If the procedure report does not answer your questions, please call your gastroenterologist to clarify.  If you requested that your care partner not be given the details of your procedure findings, then the procedure report has been included in a sealed envelope for you to review at your convenience later.  YOU SHOULD EXPECT: Some feelings of bloating in the abdomen. Passage of more gas than usual.  Walking can help get rid of the air that was put into your GI tract during the procedure and reduce the bloating. If you had a lower endoscopy (such as a colonoscopy or flexible sigmoidoscopy) you may notice spotting of blood in your stool or on the toilet paper. If you underwent a bowel prep for your procedure, then you may not have a normal bowel movement for a few days.  DIET: Your first meal following the procedure should be a light meal and then it is ok to progress to your normal diet.  A half-sandwich or bowl of soup is an example of a good first meal.  Heavy or fried foods are harder to digest and may make you feel nauseous or bloated.  Likewise meals heavy in dairy and vegetables can cause extra gas to form and this can also increase the bloating.  Drink plenty of fluids but you should avoid alcoholic beverages for 24 hours.  ACTIVITY: Your care partner should take you home directly after the procedure.  You should plan to take it easy, moving slowly for the rest of the day.  You can resume normal activity the day after the procedure however you should NOT DRIVE or use heavy machinery for 24 hours (because of the sedation medicines used during the test).    SYMPTOMS TO REPORT IMMEDIATELY: A gastroenterologist can be reached at any hour.  During normal business hours, 8:30 AM to 5:00 PM Monday through Friday,  call [phone].  After hours and on weekends, please call the GI answering service at [phone] who will take a message and have the physician on call contact you.   'Following upper endoscopy (EGD)  Vomiting of blood or coffee ground material  New chest pain or pain under the shoulder blades  Painful or persistently difficult swallowing  New shortness of breath  Fever of 100F or higher  Black, tarry-looking stools  FOLLOW UP: If any biopsies were taken you will be contacted by phone or by letter within the next 1-3 weeks.  Call your gastroenterologist if you have not heard about the biopsies in 3 weeks.  Our staff will call the home number listed on your records the next business day following your procedure to check on you and address any questions or concerns that you may have at that time regarding the information given to you following your procedure. This is a courtesy call and so if there is no answer at the home number and we have not heard from you through the emergency physician on call, we will assume that you have returned to your regular daily activities without incident.  SIGNATURES/CONFIDENTIALITY: You and/or your care partner have signed paperwork which will be entered into your electronic medical record.  These signatures attest to the fact that that the information above on your After Visit Summary has been reviewed and is understood.  Full responsibility  of the confidentiality of this discharge information lies with you and/or your care-partner.   TAKE YOUR NEW PRESCRIPTION FOR PROTONIX EVERY MORNING 1/2 HR BEFORE BREAKFAST.  Take your hemoccult test in 1 month  FOLLOW THE DIET INSTRUCTIONS IN THE PACKET.  You need bloodwork in one month, and the 3rd floor staff will call you with the date.  It will probably be before your return visit in one month.  Thank-you for choosing [place] for your healthcare needs.

## 2012-11-09 NOTE — Op Note (Addendum)
[facility] [address] [place], [address]   ENDOSCOPY PROCEDURE REPORT  PATIENT: Robert, Stanton  MR#: [id number] BIRTHDATE: 09-11-1941 , 71  yrs. old GENDER: Male ENDOSCOPIST: Louis Meckel, MD REFERRED BY:  Lucky Cowboy, M.D. PROCEDURE DATE:  11/09/2012 PROCEDURE:  EGD w/ biopsy ASA CLASS:     Class III INDICATIONS:  Anemia. MEDICATIONS: MAC sedation, administered by CRNA, propofol (Diprivan) 100mg  IV, and Simethicone 0.6cc PO TOPICAL ANESTHETIC: Cetacaine Spray  DESCRIPTION OF PROCEDURE: After the risks benefits and alternatives of the procedure were thoroughly explained, informed consent was obtained.  The [facility] [id number]-H180 [id number] endoscope was introduced through the mouth and advanced to the third portion of the duodenum. Without limitations.  The instrument was slowly withdrawn as the mucosa was fully examined.      There was a stricture at the GE junction.  The 9.8 mm gastroscope easily traversed the stricture.  Superficial erosions were seen in the stricture.  A large sliding hiatal hernia was noted measuring 6-7 cm.  There were multiple erosions within the hiatal hernia.  In addition, there are multiple superficial erosions with minimal amounts of hemorrhagic mucosa throughout the body and antrum. Biopsies were taken.  The remainder of the exam including the duodenum was normal.  Retroflexed views revealed no abnormalities. The scope was then withdrawn from the patient and the procedure completed.  COMPLICATIONS: There were no complications. ENDOSCOPIC IMPRESSION: 1. esophageal stricture 2.  esophageal erosions 3.  erosive gastritis   RECOMMENDATIONS: 1.  Protonix 40 mg qd 2.   Hemoccults in one month 3.  CBC one month 4.  OV 1 month  REPEAT EXAM:  eSigned:  Louis Meckel, MD 11/09/2012 3:54 PM   CC:  PATIENT NAME:  Robert, Stanton MR#: [id number]

## 2012-11-09 NOTE — Progress Notes (Signed)
Called to room to assist during endoscopic procedure.  Patient ID and intended procedure confirmed with present staff. Received instructions for my participation in the procedure from the performing physician.  

## 2012-11-10 ENCOUNTER — Other Ambulatory Visit: Payer: Self-pay | Admitting: Gastroenterology

## 2012-11-10 ENCOUNTER — Telehealth: Payer: Self-pay | Admitting: *Deleted

## 2012-11-10 DIAGNOSIS — K625 Hemorrhage of anus and rectum: Secondary | ICD-10-CM

## 2012-11-10 NOTE — Telephone Encounter (Signed)
Pt's Protonix rx found on the printer this a.m.  I called the pt and LMOM that I was mailing RX to him  RX mailed

## 2012-11-10 NOTE — Telephone Encounter (Signed)
  Follow up Call-  Call back number 11/09/2012  Post procedure Call Back phone  # [phone]  Permission to leave phone message Yes    [facility]

## 2012-11-16 ENCOUNTER — Encounter: Payer: Self-pay | Admitting: Gastroenterology

## 2012-11-28 ENCOUNTER — Other Ambulatory Visit (INDEPENDENT_AMBULATORY_CARE_PROVIDER_SITE_OTHER): Payer: Medicare Other

## 2012-11-28 DIAGNOSIS — K625 Hemorrhage of anus and rectum: Secondary | ICD-10-CM

## 2012-11-28 LAB — HEMOCCULT SLIDES (X 3 CARDS)
OCCULT 1: NEGATIVE
OCCULT 4: NEGATIVE
OCCULT 5: NEGATIVE

## 2012-11-29 NOTE — Progress Notes (Signed)
Quick Note:  Please inform the patient that Hemoccults were negative. He needs a CBC. ______

## 2012-12-02 ENCOUNTER — Other Ambulatory Visit: Payer: Self-pay

## 2012-12-11 ENCOUNTER — Ambulatory Visit (INDEPENDENT_AMBULATORY_CARE_PROVIDER_SITE_OTHER): Payer: Medicare Other | Admitting: Gastroenterology

## 2012-12-11 ENCOUNTER — Other Ambulatory Visit (INDEPENDENT_AMBULATORY_CARE_PROVIDER_SITE_OTHER): Payer: Medicare Other

## 2012-12-11 ENCOUNTER — Encounter: Payer: Self-pay | Admitting: Gastroenterology

## 2012-12-11 VITALS — BP 110/70 | HR 68 | Ht 67.0 in | Wt 219.0 lb

## 2012-12-11 DIAGNOSIS — R634 Abnormal weight loss: Secondary | ICD-10-CM

## 2012-12-11 DIAGNOSIS — K625 Hemorrhage of anus and rectum: Secondary | ICD-10-CM

## 2012-12-11 DIAGNOSIS — D649 Anemia, unspecified: Secondary | ICD-10-CM

## 2012-12-11 LAB — CBC WITH DIFFERENTIAL/PLATELET
Basophils Absolute: 0 10*3/uL (ref 0.0–0.1)
Basophils Relative: 0.5 % (ref 0.0–3.0)
Eosinophils Absolute: 0.1 10*3/uL (ref 0.0–0.7)
Lymphocytes Relative: 32.3 % (ref 12.0–46.0)
MCHC: 33.1 g/dL (ref 30.0–36.0)
Neutrophils Relative %: 57.3 % (ref 43.0–77.0)
RBC: 4.24 Mil/uL (ref 4.22–5.81)

## 2012-12-11 NOTE — Patient Instructions (Addendum)
We will contact you with lab results  Call back if weight loss continues

## 2012-12-11 NOTE — Progress Notes (Signed)
History of Present Illness:  Robert Stanton has returned following upper endoscopy for workup of Hemoccult-positive stool and slight drop in hemoglobin. Cameron erosions were seen. He's been taking protonix and feels well. Followup Hemoccults were negative. A CBC was drawn today.  He's lost 8 pounds since his last visit in December. Appetite is good. He has a eliminated sodas and fast food.    Review of Systems: Pertinent positive and negative review of systems were noted in the above HPI section. All other review of systems were otherwise negative.    Current Medications, Allergies, Past Medical History, Past Surgical History, Family History and Social History were reviewed in [employer] electronic medical record  Vital signs were reviewed in today's medical record. Physical Exam: General: Well developed , well nourished, no acute distress Skin: anicteric

## 2012-12-11 NOTE — Assessment & Plan Note (Signed)
Weight loss may be diet-related. I instructed the patient to contact me if he continues to lose weight.

## 2012-12-11 NOTE — Assessment & Plan Note (Signed)
Anemia may have been related to [facility] erosions. Recently he was Hemoccult negative. Provided that CBC is stable we'll not pursue GI workup any further.

## 2012-12-13 NOTE — Progress Notes (Signed)
Quick Note:  Please inform the patient that Hg is stable and to continue current plan of action. No further GI w/u ______

## 2013-02-23 ENCOUNTER — Other Ambulatory Visit (HOSPITAL_COMMUNITY): Payer: Self-pay | Admitting: Internal Medicine

## 2013-02-23 ENCOUNTER — Ambulatory Visit (HOSPITAL_COMMUNITY)
Admission: RE | Admit: 2013-02-23 | Discharge: 2013-02-23 | Disposition: A | Discharge status: Home / Self Care | Payer: Medicare Other | Source: Ambulatory Visit | Attending: Internal Medicine | Admitting: Internal Medicine

## 2013-02-23 DIAGNOSIS — J449 Chronic obstructive pulmonary disease, unspecified: Secondary | ICD-10-CM | POA: Insufficient documentation

## 2013-02-23 DIAGNOSIS — R062 Wheezing: Secondary | ICD-10-CM | POA: Insufficient documentation

## 2013-02-23 DIAGNOSIS — J4489 Other specified chronic obstructive pulmonary disease: Secondary | ICD-10-CM | POA: Insufficient documentation

## 2013-02-23 DIAGNOSIS — J209 Acute bronchitis, unspecified: Secondary | ICD-10-CM

## 2013-02-23 DIAGNOSIS — R0602 Shortness of breath: Secondary | ICD-10-CM | POA: Insufficient documentation

## 2013-02-23 DIAGNOSIS — F17200 Nicotine dependence, unspecified, uncomplicated: Secondary | ICD-10-CM | POA: Insufficient documentation

## 2013-02-23 DIAGNOSIS — R091 Pleurisy: Secondary | ICD-10-CM | POA: Insufficient documentation

## 2013-02-27 ENCOUNTER — Other Ambulatory Visit: Payer: Self-pay | Admitting: Physician Assistant

## 2013-02-27 ENCOUNTER — Other Ambulatory Visit: Payer: Self-pay | Admitting: Internal Medicine

## 2013-02-27 DIAGNOSIS — J929 Pleural plaque without asbestos: Secondary | ICD-10-CM

## 2013-03-02 ENCOUNTER — Ambulatory Visit
Admission: RE | Admit: 2013-03-02 | Discharge: 2013-03-02 | Disposition: A | Discharge status: Home / Self Care | Payer: Medicare Other | Source: Ambulatory Visit | Attending: Internal Medicine | Admitting: Internal Medicine

## 2013-03-02 DIAGNOSIS — J929 Pleural plaque without asbestos: Secondary | ICD-10-CM

## 2013-03-27 ENCOUNTER — Telehealth: Payer: Self-pay | Admitting: Gastroenterology

## 2013-03-27 NOTE — Telephone Encounter (Signed)
Pt scheduled to see Willette Cluster NP 03/29/13@1 :30pmDarel Hong to notify pt of appt date and time.

## 2013-03-28 ENCOUNTER — Ambulatory Visit (INDEPENDENT_AMBULATORY_CARE_PROVIDER_SITE_OTHER): Payer: Medicare Other | Admitting: Physician Assistant

## 2013-03-28 ENCOUNTER — Encounter: Payer: Self-pay | Admitting: Physician Assistant

## 2013-03-28 ENCOUNTER — Other Ambulatory Visit (INDEPENDENT_AMBULATORY_CARE_PROVIDER_SITE_OTHER): Payer: Medicare Other

## 2013-03-28 ENCOUNTER — Ambulatory Visit (INDEPENDENT_AMBULATORY_CARE_PROVIDER_SITE_OTHER): Payer: Medicare Other | Admitting: Nurse Practitioner

## 2013-03-28 ENCOUNTER — Encounter: Payer: Self-pay | Admitting: Nurse Practitioner

## 2013-03-28 VITALS — BP 128/68 | HR 66 | Ht 69.0 in | Wt 232.0 lb

## 2013-03-28 VITALS — BP 136/68 | HR 55 | Ht 69.0 in | Wt 230.0 lb

## 2013-03-28 DIAGNOSIS — D649 Anemia, unspecified: Secondary | ICD-10-CM

## 2013-03-28 DIAGNOSIS — I739 Peripheral vascular disease, unspecified: Secondary | ICD-10-CM

## 2013-03-28 LAB — CBC WITH DIFFERENTIAL/PLATELET
Eosinophils Absolute: 0.1 10*3/uL (ref 0.0–0.7)
Eosinophils Relative: 2.2 % (ref 0.0–5.0)
MCV: 89.6 fl (ref 78.0–100.0)
Monocytes Absolute: 0.8 10*3/uL (ref 0.1–1.0)
Neutrophils Relative %: 62 % (ref 43.0–77.0)
Platelets: 224 10*3/uL (ref 150.0–400.0)
WBC: 6.8 10*3/uL (ref 4.5–10.5)

## 2013-03-28 NOTE — Progress Notes (Signed)
Date:  03/28/2013   ID:  Robert Stanton, DOB 13-Mar-1941, MRN [id number]  PCP:  [name], MD  Primary Cardiologist:  [name]     History of Present Illness: Robert Stanton is a 72 y.o. male with a history of continued tobacco abuse coronary artery bypass grafting in 2000 with a LIMA to the LAD.  All vein grafts were noted to be occluded. Also has a St. Jude AVR.  His last 2-D echocardiogram showed an ejection fraction of 40-45%. History also includes obstructive sleep apnea, COPD, recurrent atrial fibrillation/flutter, right to left fem-fem bypass grafting in the late 90s.  His last set of lower extremity arterial Dopplers were February 2013 revealed right ABI of 0.75 and left 1.1 with patent fem-fem bypass graft. 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 Dr. [name] started him on amiodarone with plan to try at cardioversion again, however, patient declined. He has remained on amiodarone. He he's had recent problems with bradycardia so his amiodarone was decreased to 200 mg daily Cardizem was decreased 120 mg.  Patient presents today after being seen in Dr. [name] office.  While there he was noted to have a pulsatile mass in his right groin. This erythema moderately painful. The patient also reports claudication particularly in the right leg after 100-200 feet of ambulation.  Then takes approximately 10-15 minutes for the pain to subside. The right leg is worse than the left. + LEE  The patient currently denies nausea, vomiting, fever, chest pain, shortness of breath, orthopnea, dizziness, PND, cough, congestion, abdominal pain, hematochezia, melena.     Wt Readings from Last 3 Encounters:  03/28/13 232 lb (105.235 kg)  03/28/13 230 lb (104.327 kg)  12/11/12 219 lb (99.338 kg)     Past  Medical History  Diagnosis Date  . Hypertension   . History of artificial heart valve   . Kidney cysts   . Cataracts, bilateral   . Hyperlipidemia   . COPD (chronic obstructive pulmonary disease)     Current Outpatient Prescriptions  Medication Sig Dispense Refill  . bumetanide (BUMEX) 2 MG tablet Take 2 mg by mouth daily.      . cetirizine (ZYRTEC) 10 MG tablet Take 10 mg by mouth daily.      . Cholecalciferol (VITAMIN D3) 10000 UNITS capsule Take 10,000 Units by mouth daily.      . citalopram (CELEXA) 40 MG tablet Take 40 mg by mouth daily.       . clonazePAM (KLONOPIN) 1 MG tablet Take 1 mg by mouth at bedtime as needed.       . Cyanocobalamin (VITAMIN B 12 PO) Take 1,500 mg by mouth.      . diltiazem (TIAZAC) 120 MG 24 hr capsule       . fenofibrate micronized (LOFIBRA) 134 MG capsule Take 134 mg by mouth daily before breakfast.       . ipratropium-albuterol (DUONEB) 0.5-2.5 (3) MG/3ML SOLN 3 mLs every 6 (six) hours as needed.       . isosorbide mononitrate (IMDUR) 30 MG 24 hr tablet       . levothyroxine (SYNTHROID, LEVOTHROID) 50 MCG tablet Take 50 mcg by mouth daily before breakfast.      . losartan (COZAAR) 100 MG tablet Take 100 mg by mouth daily.      . Multiple Vitamins-Minerals (MULTIVITAMIN  PO) Take by mouth.      [name] PACERONE 200 MG tablet       . pantoprazole (PROTONIX) 40 MG tablet Take 1 tablet (40 mg total) by mouth daily.  90 tablet  3  . pravastatin (PRAVACHOL) 40 MG tablet Take 40 mg by mouth daily.      [name] warfarin (COUMADIN) 5 MG tablet Take 5 mg by mouth daily.       [name] allopurinol (ZYLOPRIM) 300 MG tablet Take 300 mg by mouth daily.        No current facility-administered medications for this visit.    Allergies:    Allergies  Allergen Reactions  . Simvastatin     Pt said he had a "bleed out" after taking it    Social History:  The patient  reports that he has been smoking Cigarettes.  He has been smoking about 1.00 pack per day. He has never used  smokeless tobacco. He reports that he does not drink alcohol or use illicit drugs.   ROS:  Please see the history of present illness.  All other systems reviewed and negative.   PHYSICAL EXAM: VS:  BP 128/68  Pulse 66  Ht 5\' 9"  (1.753 m)  Wt 232 lb (105.235 kg)  BMI 34.24 kg/m2 obese, well developed, in no acute distress HEENT: Pupils are equal round react to light accommodation extraocular movements are intact.  Neck: no JVDNo cervical lymphadenopathy. Cardiac: Regular rate and rhythm without murmurs rubs or gallops. Lungs:  Bilateral rhonchi. Abd: soft, nontender, positive bowel sounds all quadrants, no hepatosplenomegaly Ext: Trace lower extremity edema.  2+ radial 1+ left dorsalis pedis and posterior tibialis pulses. No palpable right PT or DP pulses. 1+ right popliteal.  Approximate 3 cm pulsatile mass/bounding femoral pulse in the right groin with audible bruit.  2+ left femoral pulse. Skin: warm and dry Neuro:  Grossly normal      ASSESSMENT AND PLAN:  Problem List Items Addressed This Visit   CLAUDICATION - Primary     We'll repeat your lower extremity arterial Dopplers secondary to worsening claudication symptoms in the right leg as well as pulsatile mass in the right groin with audible bruit.

## 2013-03-28 NOTE — Patient Instructions (Addendum)
Please go to the basement level to have your labs drawn.  We will call you with the results. 

## 2013-03-28 NOTE — Patient Instructions (Addendum)
Call our office if the pain in your right groin becomes more severe or he develop numbness and/or cold right foot. Your physician has requested that you have a lower extremity arterial duplex. This test is an ultrasound of the arteries in the legs . It looks at arterial blood flow in the legs. Allow one hour for Lower Arterial scans. There are no restrictions or special instructions. Your physician recommends that you schedule a follow-up appointment after doppler

## 2013-03-28 NOTE — Assessment & Plan Note (Signed)
We'll repeat your lower extremity arterial Dopplers secondary to worsening claudication symptoms in the right leg as well as pulsatile mass in the right groin with audible bruit.

## 2013-03-28 NOTE — Assessment & Plan Note (Signed)
Blood pressure controlled at this time °

## 2013-03-28 NOTE — Progress Notes (Signed)
  History of Present Illness:  Patient is a 72 year old male with multiple medical problems including, but not limited to CAD / CABG, AVR on chronic coumadin, OSA, COPD, atrial fib / flutter, and PVD. He is on multiple medications.  Patient had a colonoscopy in 2008 for evaluation of hematochezia on Coumadin. Findings included diverticulosis, hemorrhoids and what was thought to be a polyp but pathology report actually identified this is fecal material .  Patient was evaluated last December by Dr. Arlyce Dice for a drop in hemoglobin. Hemoglobin had declined from 12.2 to 11 without overt GI bleeding . A CT scan showed a large hiatal hernia. Colonoscopy was not repeated but he did undergo upper endoscopy with findings of an esophageal stricture, cameron erosions and erosive esophagitis.  Last hemoglobin in our system dated 12/11/12 was 12.1.    Patient has been referred for recurrent anemia. Labs from PCP reviewed. Hgb 10.7 on 03/21/13 which is down from 12.8 according to PCP notes. His INR was slightly above target range at 2.99 around the same time and coumadin was held for a couple of days.  Interestingly, repeat hgb at same lab on 03/27/13 was 12.0. It should also be noted that patient's creatinine increased from 1.9 in February to 2.4.  He is taking a daily PPI. No overt bleeding. No abdominal pain. No nausea.   Current Medications, Allergies, Past Medical History, Past Surgical History, Family History and Social History were reviewed in [employer] record.  Physical Exam: General: pleasant obese white male in no acute distress Head: Normocephalic and atraumatic Eyes:  sclerae anicteric, conjunctiva pink  Ears: Normal auditory acuity Lungs: Clear throughout to auscultation Heart: Regular rate and rhythm Abdomen: Soft, non distended, non-tender. No masses, no hepatomegaly. Normal bowel sounds Rectal: Yellowish Hemoccult-negative stool in vault Musculoskeletal: Symmetrical with no  gross deformities  Extremities: 1 + bilateral lower extremity edema  Neurological: Alert oriented x 4, grossly nonfocal Psychological:  Alert and cooperative. Normal mood and affect  Assessment and Recommendations: 1. Anemia, drop in hgb on recent labs from 12.8 to 10.7. Repeat hgb 6 days later on 03/27/13 was 12.0. Not sure why such variation but patient has no overt GI bleeding and is heme negative on exam today. Will repeat CBC today to compare with other values. No evidence for GI bleeding at present. Unless hgb trending down or has overt GI bleeding, no indications for GI workup at present. Will call patient with results.   2. History of erosive disease. Patient is on chronic PPI.  3. Diverticulosis. Up to date on colonoscopy (2008)  4. Multiple medical problems. On chronic coumadin.

## 2013-03-29 ENCOUNTER — Encounter: Payer: Self-pay | Admitting: Nurse Practitioner

## 2013-03-29 ENCOUNTER — Ambulatory Visit: Payer: Medicare Other | Admitting: Nurse Practitioner

## 2013-04-02 NOTE — Progress Notes (Signed)
Reviewed and agree with management. Robert D. Kaplan, M.D., FACG  

## 2013-04-03 ENCOUNTER — Ambulatory Visit: Payer: Medicare Other | Admitting: Internal Medicine

## 2013-04-05 ENCOUNTER — Ambulatory Visit (HOSPITAL_COMMUNITY)
Admission: RE | Admit: 2013-04-05 | Discharge: 2013-04-05 | Disposition: A | Discharge status: Home / Self Care | Payer: Medicare Other | Source: Ambulatory Visit | Attending: Cardiovascular Disease | Admitting: Cardiovascular Disease

## 2013-04-05 DIAGNOSIS — I70219 Atherosclerosis of native arteries of extremities with intermittent claudication, unspecified extremity: Secondary | ICD-10-CM

## 2013-04-05 DIAGNOSIS — I739 Peripheral vascular disease, unspecified: Secondary | ICD-10-CM

## 2013-04-05 NOTE — Progress Notes (Signed)
Arterial Duplex Lower Ext. Completed. Corianne Buccellato, RDMS, RVT  

## 2013-04-06 ENCOUNTER — Encounter: Payer: Self-pay | Admitting: Internal Medicine

## 2013-04-06 ENCOUNTER — Ambulatory Visit (INDEPENDENT_AMBULATORY_CARE_PROVIDER_SITE_OTHER): Payer: Medicare Other | Admitting: Internal Medicine

## 2013-04-06 ENCOUNTER — Ambulatory Visit: Payer: Medicare Other | Admitting: Internal Medicine

## 2013-04-06 VITALS — BP 124/78 | HR 53 | Ht 69.0 in | Wt 234.2 lb

## 2013-04-06 DIAGNOSIS — E7800 Pure hypercholesterolemia, unspecified: Secondary | ICD-10-CM

## 2013-04-06 DIAGNOSIS — I10 Essential (primary) hypertension: Secondary | ICD-10-CM

## 2013-04-06 DIAGNOSIS — Z79899 Other long term (current) drug therapy: Secondary | ICD-10-CM

## 2013-04-06 DIAGNOSIS — I739 Peripheral vascular disease, unspecified: Secondary | ICD-10-CM

## 2013-04-06 DIAGNOSIS — I2510 Atherosclerotic heart disease of native coronary artery without angina pectoris: Secondary | ICD-10-CM

## 2013-04-06 DIAGNOSIS — I252 Old myocardial infarction: Secondary | ICD-10-CM

## 2013-04-06 DIAGNOSIS — Z951 Presence of aortocoronary bypass graft: Secondary | ICD-10-CM

## 2013-04-06 NOTE — Progress Notes (Signed)
OFFICE NOTE  Chief Complaint:  Weight gain, routine follow-up  Primary Care Physician: Robert Corwin, MD  HPI:  Robert Stanton is a 72 year old gentleman with a history of CABG in 2000 with a LIMA to the LAD. All vein grafts were noted to be occluded, and he had a St. Jude AVR which is notably functioning. EF is about 40-45% in 2012. He also has sleep apnea, COPD, and recurrent atrial fibrillation/flutter. He had cardioversion in 2012 but is back in atrial fibrillation, and I started him on amiodarone with the plan of cardioversion. He at his last visit actually declined cardioversion, however, we have kept him on amiodarone because it has seemed to benefit him as far as reducing multiple PVCs which were previously noted. Today he continues to have sinus bradycardia despite decreasing his Cardizem at the last visit from 180 mg to 120 mg daily. The heart rate remains around 48 with a significant intraventricular conduction delay suggesting underlying conduction disease. He reports a recent gout attack, and was given the lower. He's also had significant weight gain and lower extremity fluid retention. This could be due to worsening heart failure. He continues to be bradycardic, with interventricular conduction delay. I've been decreasing his amiodarone intake is about time that we discontinue it.  PMHx:  Past Medical History  Diagnosis Date  . Hypertension   . History of artificial heart valve   . Kidney cysts   . Cataracts, bilateral   . Hyperlipidemia   . COPD (chronic obstructive pulmonary disease)     Past Surgical History  Procedure Laterality Date  . Stents in kidneys    . Triple bypass    . Shunts in femoral arteries      FAMHx:  Family History  Problem Relation Age of Onset  . Heart attack Father   . Heart attack Mother   . Lung cancer Sister     x 2  . Kidney cancer Sister   . Kidney disease Sister   . Cancer Brother     ?    SOCHx:   reports that he  has been smoking Cigarettes.  He has been smoking about 1.00 pack per day. He has never used smokeless tobacco. He reports that he does not drink alcohol or use illicit drugs.  ALLERGIES:  Allergies  Allergen Reactions  . Simvastatin     Pt said he had a "bleed out" after taking it    ROS: A comprehensive review of systems was negative except for: Respiratory: positive for dyspnea on exertion Cardiovascular: positive for fatigue and lower extremity edema  HOME MEDS: Current Outpatient Prescriptions  Medication Sig Dispense Refill  . bumetanide (BUMEX) 2 MG tablet Take 2 mg by mouth daily.      . cetirizine (ZYRTEC) 10 MG tablet Take 10 mg by mouth daily.      . citalopram (CELEXA) 40 MG tablet Take 40 mg by mouth daily.       . clonazePAM (KLONOPIN) 1 MG tablet Take 1 mg by mouth at bedtime as needed.       . Cyanocobalamin (VITAMIN B 12 PO) Take 1,500 mg by mouth.      . diltiazem (TIAZAC) 120 MG 24 hr capsule Take 120 mg by mouth daily.       . fenofibrate micronized (LOFIBRA) 134 MG capsule Take 134 mg by mouth daily before breakfast.       . ipratropium-albuterol (DUONEB) 0.5-2.5 (3) MG/3ML SOLN 3 mLs every 6 (six) hours as needed.       [name]  isosorbide mononitrate (IMDUR) 30 MG 24 hr tablet       . levothyroxine (SYNTHROID, LEVOTHROID) 50 MCG tablet Take 50 mcg by mouth daily before breakfast.      . losartan (COZAAR) 100 MG tablet Take 100 mg by mouth daily.      [name] PACERONE 200 MG tablet Take 200 mg by mouth daily.       . pantoprazole (PROTONIX) 40 MG tablet Take 1 tablet (40 mg total) by mouth daily.  90 tablet  3  . pravastatin (PRAVACHOL) 40 MG tablet Take 40 mg by mouth daily.      [name] warfarin (COUMADIN) 5 MG tablet Take 5 mg by mouth daily.       [name] allopurinol (ZYLOPRIM) 300 MG tablet Take 300 mg by mouth daily.       . Cholecalciferol (VITAMIN D3) 10000 UNITS capsule Take 10,000 Units by mouth daily.       No current facility-administered medications for this visit.     LABS/IMAGING: No results found for this or any previous visit (from the past 48 hour(s)). No results found.  VITALS: BP 124/78  Pulse 53  Ht 5\' 9"  (1.753 m)  Wt 234 lb 3.2 oz (106.232 kg)  BMI 34.57 kg/m2  EXAM: General appearance: alert and no distress Neck: no adenopathy, no carotid bruit, no JVD, supple, symmetrical, trachea midline and thyroid not enlarged, symmetric, no tenderness/mass/nodules Lungs: clear to auscultation bilaterally Heart: irregularly irregular rhythm and marked bradycardia Abdomen: morbidly obese Extremities: edema 2+ bilateral Pulses: 2+ and symmetric Skin: Skin color, texture, turgor normal. No rashes or lesions Neurologic: Grossly normal  EKG: Atrial fibrillation at 53  ASSESSMENT: 1. Atrial fibrillation with slow ventricular response 2. Weight gain and lower shimmied the 3. Ischemic cardiomyopathy EF 40-45% 4. Aortic stenosis status post St. Jude aortic valve replacement 5. 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 Large bilateral femoropopliteal grafts, without any sign of aneurysm, and preserved ABIs bilaterally 8. 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   Mr. Robert Stanton in his having weight gain and lower shimmied swelling. This is likely what caused his gout flare. He's also had worsening renal function, with creatinine now up to 2.3.  His worsening renal function could explain why the diuretics ineffective.  He could also have worsening cardiomyopathy. However recommend a recheck echocardiogram. In addition will increase his Bumex to 3 mg daily. I've also recommended discontinuing his amiodarone. We'll recheck a CMP in about a week to see if there's been any change in renal function and potassium. Plan to see him back in one month. He is asked to follow daily weights closely.  Robert Nose, MD, [facility] Attending Cardiologist [facility]  Robert Stanton 04/06/2013, 6:08 PM

## 2013-04-06 NOTE — Patient Instructions (Addendum)
Your physician has requested that you have an echocardiogram. Echocardiography is a painless test that uses sound waves to create images of your heart. It provides your doctor with information about the size and shape of your heart and how well your heart's chambers and valves are working. This procedure takes approximately one hour. There are no restrictions for this procedure.  Increase Bumex to 3mg   Daily (3 tablets of the 1mg  tablets).  Stop Pacerone (Amiodarone).  Complete metabolic panel in 1 week.  Your physician recommends that you schedule a follow-up appointment in: one month

## 2013-04-09 ENCOUNTER — Encounter: Payer: Self-pay | Admitting: Internal Medicine

## 2013-04-09 ENCOUNTER — Encounter: Payer: Self-pay | Admitting: Gastroenterology

## 2013-04-13 ENCOUNTER — Telehealth: Payer: Self-pay | Admitting: *Deleted

## 2013-04-13 DIAGNOSIS — I255 Ischemic cardiomyopathy: Secondary | ICD-10-CM

## 2013-04-13 NOTE — Telephone Encounter (Signed)
Order placed for echocardiogram ordered by Dr. [name]

## 2013-04-14 LAB — COMPLETE METABOLIC PANEL WITH GFR
ALT: 23 U/L (ref 0–53)
Albumin: 4.2 g/dL (ref 3.5–5.2)
CO2: 31 mEq/L (ref 19–32)
Calcium: 8.7 mg/dL (ref 8.4–10.5)
Chloride: 104 mEq/L (ref 96–112)
GFR, Est African American: 27 mL/min — ABNORMAL LOW
GFR, Est Non African American: 23 mL/min — ABNORMAL LOW
Glucose, Bld: 103 mg/dL — ABNORMAL HIGH (ref 70–99)
Potassium: 4.7 mEq/L (ref 3.5–5.3)
Sodium: 141 mEq/L (ref 135–145)
Total Protein: 6.9 g/dL (ref 6.0–8.3)

## 2013-04-16 ENCOUNTER — Telehealth: Payer: Self-pay | Admitting: *Deleted

## 2013-04-16 ENCOUNTER — Telehealth: Payer: Self-pay | Admitting: Internal Medicine

## 2013-04-16 NOTE — Telephone Encounter (Signed)
Message copied by Chauncey Reading on Mon Apr 16, 2013  8:09 AM ------      Message from: Chrystie Nose      Created: Sat Apr 14, 2013  7:55 AM       Renal function looks worse than it has been in the past - I don't see recent numbers to compare to. I will send on to Dr. Oneta Rack to investigate. Please notify the patient his kidney function looks worse - he may need to see a kidney doctor.            Dr. [name] ------

## 2013-04-16 NOTE — Telephone Encounter (Signed)
Returning your cal. °

## 2013-04-16 NOTE — Telephone Encounter (Signed)
Paper chart requested.  No ROI in Epic for son.  Will check paper chart.

## 2013-04-16 NOTE — Telephone Encounter (Signed)
Returned call and spoke w/ Fayrene Fearing, pt's son.  Informed no ROI on file to talk w/ him.  Asked if Robert Stanton is available and stated she is in the bed.  Asked if he would have pt or Mrs. Mcnab call back today when message received.  Agreed to inform them.

## 2013-04-16 NOTE — Telephone Encounter (Signed)
Call to pt and pt gave permission to talk to son, Fayrene Fearing. Results given per MD. Results also released in MyChart for pt to view w/ comment from Dr. [name]. Son verbalized understanding. Stated the urologist has referred pt to a nephrologist and they are awaiting an appt. Informed Dr. [name] will be notified.

## 2013-04-16 NOTE — Telephone Encounter (Signed)
Mr.Seelman Son returned your call

## 2013-04-16 NOTE — Telephone Encounter (Signed)
Call to pt.  Left message to call back before 4pm.

## 2013-04-17 ENCOUNTER — Ambulatory Visit (HOSPITAL_COMMUNITY)
Admission: RE | Admit: 2013-04-17 | Discharge: 2013-04-17 | Disposition: A | Discharge status: Home / Self Care | Payer: Medicare Other | Source: Ambulatory Visit | Attending: Cardiology | Admitting: Cardiology

## 2013-04-17 DIAGNOSIS — I4891 Unspecified atrial fibrillation: Secondary | ICD-10-CM

## 2013-04-17 DIAGNOSIS — I2589 Other forms of chronic ischemic heart disease: Secondary | ICD-10-CM | POA: Insufficient documentation

## 2013-04-17 DIAGNOSIS — I255 Ischemic cardiomyopathy: Secondary | ICD-10-CM

## 2013-04-17 DIAGNOSIS — I739 Peripheral vascular disease, unspecified: Secondary | ICD-10-CM | POA: Insufficient documentation

## 2013-04-17 NOTE — Progress Notes (Signed)
2D Echo Performed 04/17/2013    Tranquilino Fischler, RCS  

## 2013-04-24 ENCOUNTER — Other Ambulatory Visit: Payer: Self-pay | Admitting: Internal Medicine

## 2013-04-24 DIAGNOSIS — N289 Disorder of kidney and ureter, unspecified: Secondary | ICD-10-CM

## 2013-04-26 ENCOUNTER — Ambulatory Visit (HOSPITAL_COMMUNITY)
Admission: RE | Admit: 2013-04-26 | Discharge: 2013-04-26 | Disposition: A | Discharge status: Home / Self Care | Payer: Medicare Other | Source: Ambulatory Visit | Attending: Cardiovascular Disease | Admitting: Cardiovascular Disease

## 2013-04-26 ENCOUNTER — Encounter: Payer: Self-pay | Admitting: *Deleted

## 2013-04-26 DIAGNOSIS — N289 Disorder of kidney and ureter, unspecified: Secondary | ICD-10-CM

## 2013-04-26 DIAGNOSIS — R944 Abnormal results of kidney function studies: Secondary | ICD-10-CM | POA: Insufficient documentation

## 2013-04-26 NOTE — Progress Notes (Signed)
Renal Artery Duplex Completed. °Robert Stanton ° °

## 2013-05-01 ENCOUNTER — Ambulatory Visit (INDEPENDENT_AMBULATORY_CARE_PROVIDER_SITE_OTHER): Payer: Medicare Other | Admitting: Internal Medicine

## 2013-05-01 ENCOUNTER — Encounter: Payer: Self-pay | Admitting: Internal Medicine

## 2013-05-01 VITALS — BP 128/64 | HR 52 | Ht 68.0 in | Wt 229.7 lb

## 2013-05-01 DIAGNOSIS — I252 Old myocardial infarction: Secondary | ICD-10-CM

## 2013-05-01 DIAGNOSIS — I739 Peripheral vascular disease, unspecified: Secondary | ICD-10-CM

## 2013-05-01 DIAGNOSIS — E7800 Pure hypercholesterolemia, unspecified: Secondary | ICD-10-CM

## 2013-05-01 DIAGNOSIS — I2510 Atherosclerotic heart disease of native coronary artery without angina pectoris: Secondary | ICD-10-CM

## 2013-05-01 DIAGNOSIS — I359 Nonrheumatic aortic valve disorder, unspecified: Secondary | ICD-10-CM

## 2013-05-01 DIAGNOSIS — I10 Essential (primary) hypertension: Secondary | ICD-10-CM

## 2013-05-01 DIAGNOSIS — I701 Atherosclerosis of renal artery: Secondary | ICD-10-CM

## 2013-05-01 DIAGNOSIS — G4730 Sleep apnea, unspecified: Secondary | ICD-10-CM

## 2013-05-01 DIAGNOSIS — Z951 Presence of aortocoronary bypass graft: Secondary | ICD-10-CM

## 2013-05-01 NOTE — Patient Instructions (Addendum)
Your physician wants you to follow-up in:  6 months. You will receive a reminder letter in the mail two months in advance. If you don't receive a letter, please call our office to schedule the follow-up appointment.   

## 2013-05-01 NOTE — Progress Notes (Signed)
OFFICE NOTE  Chief Complaint:  Weight gain, routine follow-up  Primary Care Physician: [name], MD  HPI:  Robert Stanton is a 72 year old gentleman with a history of CABG in 2000 with a LIMA to the LAD. All vein grafts were noted to be occluded, and he had a St. Jude AVR which is notably functioning. EF is about 40-45% in 2012. He also has sleep apnea, COPD, and recurrent atrial fibrillation/flutter. He had cardioversion in 2012 but is back in atrial fibrillation, and I started him on amiodarone with the plan of cardioversion. He at his last visit actually declined cardioversion, however, we have kept him on amiodarone because it has seemed to benefit him as far as reducing multiple PVCs which were previously noted. Today he continues to have sinus bradycardia despite decreasing his Cardizem at the last visit from 180 mg to 120 mg daily. The heart rate remains around 48 with a significant intraventricular conduction delay suggesting underlying conduction disease. He reports a recent gout attack, and was given the lower. He's also had significant weight gain and lower extremity fluid retention. This could be due to worsening heart failure. He continues to be bradycardic, with interventricular conduction delay. I've been decreasing his amiodarone intake is about time that we discontinue it.  At his last office visit, I discontinued his amiodarone. We also increased his Bumex slightly and obtain an echocardiogram. The echocardiogram showed a stable EF of 40-45% with some mildly elevated filling pressures. Overall, there was no significant change from the prior study. 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 The renal cortex and medulla appeared bilaterally without hydronephrosis.  He does report some improvement in his swelling with the increased dose of Bumex. However, his shortness of breath is fairly stable without  improvement.  PMHx:  Past Medical History  Diagnosis Date  . Hypertension   . History of artificial heart valve   . Kidney cysts   . Cataracts, bilateral   . Hyperlipidemia   . COPD (chronic obstructive pulmonary disease)   . OSA (obstructive sleep apnea)   . Atrial fibrillation and flutter   . CKD (chronic kidney disease)   . Hiatal hernia   . Ischemic cardiomyopathy   . History of echocardiogram 04/17/2013    EF 40-45%; LV hypertrophy; LV mild-mod dilated; AV regurg.; LA severely diated, RA mildly dilated  . History of Doppler ultrasound 04/05/2013    LEAs; small distal abd. aoritc aneuysm is stable; fem-pop graft to R leg has 50-69% blockage  . History of nuclear stress test 05/14/2011    Dipyridamole; moderate perfusion defect in Basal Infrioer & Mid Inferior region - consistent w/infarct or scar; global LV systolic function mildly reduced; EKG negative for ischemia; low risk scan    Past Surgical History  Procedure Laterality Date  . Stents in kidneys    . Coronary artery bypass graft  2000    3 vessel; LIMA to LAD; RCA, OM  . Shunts in femoral arteries    . Cardioversion  8/282012  . Aortic valve replacement      St. Jude  . Femoral bypass    . Cardiac catheterization  11/25/2008    loss of 2/3 (RCA, OM) bypass grafts with patent internal mammary artery to LAD; large collateral filling of RCA ; osteal narrowing of circumflex of ~50%    FAMHx:  Family History  Problem Relation Age of Onset  . Heart attack Father   . Heart attack  Mother   . Lung cancer Sister     x 2  . Kidney cancer Sister   . Kidney disease Sister   . Cancer Brother     ?  . Stroke Brother     SOCHx:   reports that he has been smoking Cigarettes.  He has a 20 pack-year smoking history. He has never used smokeless tobacco. He reports that he does not drink alcohol or use illicit drugs.  ALLERGIES:  Allergies  Allergen Reactions  . Nsaids   . Simvastatin     Pt said he had a "bleed out" after  taking it    ROS: A comprehensive review of systems was negative except for: Respiratory: positive for dyspnea on exertion Cardiovascular: positive for fatigue and lower extremity edema  HOME MEDS: Current Outpatient Prescriptions  Medication Sig Dispense Refill  . allopurinol (ZYLOPRIM) 300 MG tablet Take 300 mg by mouth daily.       . bumetanide (BUMEX) 2 MG tablet Take 2 mg by mouth daily.      . cetirizine (ZYRTEC) 10 MG tablet Take 10 mg by mouth daily.      . Cholecalciferol (VITAMIN D3) 10000 UNITS capsule Take 10,000 Units by mouth daily.      . citalopram (CELEXA) 40 MG tablet Take 40 mg by mouth daily.       . clonazePAM (KLONOPIN) 1 MG tablet Take 1 mg by mouth at bedtime as needed.       . Cyanocobalamin (VITAMIN B 12 PO) Take 1,500 mg by mouth.      . diltiazem (TIAZAC) 120 MG 24 hr capsule Take 120 mg by mouth daily.       . fenofibrate micronized (LOFIBRA) 134 MG capsule Take 134 mg by mouth daily before breakfast.       . ipratropium-albuterol (DUONEB) 0.5-2.5 (3) MG/3ML SOLN 3 mLs every 6 (six) hours as needed.       . isosorbide mononitrate (IMDUR) 30 MG 24 hr tablet Take 30 mg by mouth daily.       [name] levothyroxine (SYNTHROID, LEVOTHROID) 50 MCG tablet Take 50 mcg by mouth daily before breakfast.      . losartan (COZAAR) 100 MG tablet Take 100 mg by mouth daily.      . nitroGLYCERIN (NITROSTAT) 0.4 MG SL tablet Place 0.4 mg under the tongue every 5 (five) minutes as needed for chest pain.      [name] PACERONE 200 MG tablet Take 200 mg by mouth daily.       . pantoprazole (PROTONIX) 40 MG tablet Take 1 tablet (40 mg total) by mouth daily.  90 tablet  3  . pravastatin (PRAVACHOL) 40 MG tablet Take 40 mg by mouth daily.      [name] warfarin (COUMADIN) 5 MG tablet Take 5 mg by mouth daily.        No current facility-administered medications for this visit.    LABS/IMAGING: No results found for this or any previous visit (from the past 48 hour(s)). No results  found.  VITALS: BP 128/64  Pulse 52  Ht 5\' 8"  (1.727 m)  Wt 229 lb 11.2 oz (104.191 kg)  BMI 34.93 kg/m2  EXAM: deferred  EKG: deferred  ASSESSMENT: 1. Atrial fibrillation with slow ventricular response 2. Weight gain and lower shimmied the 3. Ischemic cardiomyopathy EF 40-45% 4. Aortic stenosis status post St. Jude aortic valve replacement 5. 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 Large bilateral femoropopliteal grafts, without any sign of aneurysm, and preserved ABIs bilaterally 8. 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   [name]' weight is down to 229 from 234 with some increase in his Bumex. He is now adjusting the Bumex based on his swelling and weight. Alternating between 2 and 3 mg daily. Unfortunately his creatinine has not improved significantly. His creatinine remains about 2.7. His echocardiogram does not show any significant difference in systolic function, but signs of higher filling pressure. I suspect this is due to worsening renal failure and fluid accumulation. Although his renal parenchyma looks normal, I suspect he has intrinsic renal failure. I agree with evaluation by nephrology to try to preserve renal function and delay dialysis. Of note, his renal Dopplers did show higher velocities in the right renal artery, but not the left. There is no evidence for uncontrolled hypertension and I suspect that the renal artery finding is not contributing to his renal failure due to renal ischemia.  I would not recommend intervention based on this.  We will see him back in 6 months.  Chrystie Nose, MD, [facility] Attending Cardiologist [facility]  HILTY,Kenneth C 05/01/2013, 10:06 AM

## 2013-05-02 ENCOUNTER — Other Ambulatory Visit (HOSPITAL_COMMUNITY): Payer: Self-pay | Admitting: Internal Medicine

## 2013-05-02 ENCOUNTER — Other Ambulatory Visit: Payer: Self-pay | Admitting: Internal Medicine

## 2013-05-02 DIAGNOSIS — I10 Essential (primary) hypertension: Secondary | ICD-10-CM

## 2013-05-09 ENCOUNTER — Ambulatory Visit (HOSPITAL_COMMUNITY)
Admission: RE | Admit: 2013-05-09 | Discharge: 2013-05-09 | Disposition: A | Discharge status: Home / Self Care | Payer: Medicare Other | Source: Ambulatory Visit | Attending: Internal Medicine | Admitting: Internal Medicine

## 2013-05-09 ENCOUNTER — Inpatient Hospital Stay: Admission: RE | Admit: 2013-05-09 | Payer: Medicare Other | Source: Ambulatory Visit

## 2013-05-09 DIAGNOSIS — I10 Essential (primary) hypertension: Secondary | ICD-10-CM | POA: Insufficient documentation

## 2013-05-09 DIAGNOSIS — Q618 Other cystic kidney diseases: Secondary | ICD-10-CM | POA: Insufficient documentation

## 2013-05-09 DIAGNOSIS — K449 Diaphragmatic hernia without obstruction or gangrene: Secondary | ICD-10-CM | POA: Insufficient documentation

## 2013-05-09 DIAGNOSIS — I77811 Abdominal aortic ectasia: Secondary | ICD-10-CM | POA: Insufficient documentation

## 2013-05-23 ENCOUNTER — Other Ambulatory Visit: Payer: Self-pay

## 2013-06-28 ENCOUNTER — Other Ambulatory Visit: Payer: Self-pay | Admitting: Internal Medicine

## 2013-06-28 NOTE — Telephone Encounter (Signed)
Rx was sent to pharmacy electronically. 

## 2013-08-23 ENCOUNTER — Other Ambulatory Visit: Payer: Self-pay

## 2013-09-08 ENCOUNTER — Encounter: Payer: Self-pay | Admitting: Internal Medicine

## 2013-09-08 DIAGNOSIS — M109 Gout, unspecified: Secondary | ICD-10-CM | POA: Insufficient documentation

## 2013-09-08 DIAGNOSIS — I714 Abdominal aortic aneurysm, without rupture: Secondary | ICD-10-CM | POA: Insufficient documentation

## 2013-09-08 DIAGNOSIS — G4733 Obstructive sleep apnea (adult) (pediatric): Secondary | ICD-10-CM | POA: Insufficient documentation

## 2013-09-08 DIAGNOSIS — K219 Gastro-esophageal reflux disease without esophagitis: Secondary | ICD-10-CM | POA: Insufficient documentation

## 2013-09-08 DIAGNOSIS — N281 Cyst of kidney, acquired: Secondary | ICD-10-CM | POA: Insufficient documentation

## 2013-09-08 DIAGNOSIS — E539 Vitamin B deficiency, unspecified: Secondary | ICD-10-CM | POA: Insufficient documentation

## 2013-09-08 DIAGNOSIS — E785 Hyperlipidemia, unspecified: Secondary | ICD-10-CM | POA: Insufficient documentation

## 2013-09-10 ENCOUNTER — Ambulatory Visit: Payer: Medicare Other | Admitting: Physician Assistant

## 2013-09-10 ENCOUNTER — Encounter: Payer: Self-pay | Admitting: Physician Assistant

## 2013-09-10 VITALS — HR 56 | Temp 97.9°F | Resp 16 | Wt 229.0 lb

## 2013-09-10 DIAGNOSIS — I4891 Unspecified atrial fibrillation: Secondary | ICD-10-CM

## 2013-09-10 LAB — PROTIME-INR: Prothrombin Time: 19.1 seconds — ABNORMAL HIGH (ref 11.6–15.2)

## 2013-09-10 MED ORDER — CLONAZEPAM 1 MG PO TABS: 1.0000 mg | ORAL_TABLET | Freq: Every evening | ORAL | Status: DC | PRN

## 2013-09-10 MED ORDER — PREGABALIN 75 MG PO CAPS: 75.0000 mg | ORAL_CAPSULE | Freq: Three times a day (TID) | ORAL | Status: DC

## 2013-09-10 NOTE — Patient Instructions (Addendum)
Try lyrica 1-2 pills at night, if that helps we will give you a rx at your next office visit. If it does not get better please call. Also please follow up with your cardio and vascular doctor.  Neuropathic Pain We often think that pain has a physical cause. If we get rid of the cause, the pain should go away. Nerves themselves can also cause pain. It is called neuropathic pain, which means nerve abnormality. It may be difficult for the patients who have it and for the treating caregivers. Pain is usually described as acute (short-lived) or chronic (long-lasting). Acute pain is related to the physical sensations caused by an injury. It can last from a few seconds to many weeks, but it usually goes away when normal healing occurs. Chronic pain lasts beyond the typical healing time. With neuropathic pain, the nerve fibers themselves may be damaged or injured. They then send incorrect signals to other pain centers. The pain you feel is real, but the cause is not easy to find.  CAUSES  Chronic pain can result from diseases, such as diabetes and shingles (an infection related to chickenpox), or from trauma, surgery, or amputation. It can also happen without any known injury or disease. The nerves are sending pain messages, even though there is no identifiable cause for such messages.   Other common causes of neuropathy include diabetes, phantom limb pain, or Regional Pain Syndrome (RPS).  As with all forms of chronic back pain, if neuropathy is not correctly treated, there can be a number of associated problems that lead to a downward cycle for the patient. These include depression, sleeplessness, feelings of fear and anxiety, limited social interaction and inability to do normal daily activities or work.  The most dramatic and mysterious example of neuropathic pain is called "phantom limb syndrome." This occurs when an arm or a leg has been removed because of illness or injury. The brain still gets pain  messages from the nerves that originally carried impulses from the missing limb. These nerves now seem to misfire and cause troubling pain.  Neuropathic pain often seems to have no cause. It responds poorly to standard pain treatment. Neuropathic pain can occur after:  Shingles (herpes zoster virus infection).  A lasting burning sensation of the skin, caused usually by injury to a peripheral nerve.  Peripheral neuropathy which is widespread nerve damage, often caused by diabetes or alcoholism.  Phantom limb pain following an amputation.  Facial nerve problems (trigeminal neuralgia).  Multiple sclerosis.  Reflex sympathetic dystrophy.  Pain which comes with cancer and cancer chemotherapy.  Entrapment neuropathy such as when pressure is put on a nerve such as in carpal tunnel syndrome.  Back, leg, and hip problems (sciatica).  Spine or back surgery.  HIV Infection or AIDS where nerves are infected by viruses. Your caregiver can explain items in the above list which may apply to you. SYMPTOMS  Characteristics of neuropathic pain are:  Severe, sharp, electric shock-like, shooting, lightening-like, knife-like.  Pins and needles sensation.  Deep burning, deep cold, or deep ache.  Persistent numbness, tingling, or weakness.  Pain resulting from light touch or other stimulus that would not usually cause pain.  Increased sensitivity to something that would normally cause pain, such as a pinprick. Pain may persist for months or years following the healing of damaged tissues. When this happens, pain signals no longer sound an alarm about current injuries or injuries about to happen. Instead, the alarm system itself is not working correctly.  Neuropathic pain may get worse instead of better over time. For some people, it can lead to serious disability. It is important to be aware that severe injury in a limb can occur without a proper, protective pain response.Burns, cuts, and other  injuries may go unnoticed. Without proper treatment, these injuries can become infected or lead to further disability. Take any injury seriously, and consult your caregiver for treatment. DIAGNOSIS  When you have a pain with no known cause, your caregiver will probably ask some specific questions:   Do you have any other conditions, such as diabetes, shingles, multiple sclerosis, or HIV infection?  How would you describe your pain? (Neuropathic pain is often described as shooting, stabbing, burning, or searing.)  Is your pain worse at any time of the day? (Neuropathic pain is usually worse at night.)  Does the pain seem to follow a certain physical pathway?  Does the pain come from an area that has missing or injured nerves? (An example would be phantom limb pain.)  Is the pain triggered by minor things such as rubbing against the sheets at night? These questions often help define the type of pain involved. Once your caregiver knows what is happening, treatment can begin. Anticonvulsant, antidepressant drugs, and various pain relievers seem to work in some cases. If another condition, such as diabetes is involved, better management of that disorder may relieve the neuropathic pain.  TREATMENT  Neuropathic pain is frequently long-lasting and tends not to respond to treatment with narcotic type pain medication. It may respond well to other drugs such as antiseizure and antidepressant medications. Usually, neuropathic problems do not completely go away, but partial improvement is often possible with proper treatment. Your caregivers have large numbers of medications available to treat you. Do not be discouraged if you do not get immediate relief. Sometimes different medications or a combination of medications will be tried before you receive the results you are hoping for. See your caregiver if you have pain that seems to be coming from nowhere and does not go away. Help is available.  SEEK IMMEDIATE  MEDICAL CARE IF:   There is a sudden change in the quality of your pain, especially if the change is on only one side of the body.  You notice changes of the skin, such as redness, black or purple discoloration, swelling, or an ulcer.  You cannot move the affected limbs. Document Released: 07/01/2004 Document Revised: 12/27/2011 Document Reviewed: 07/01/2004 [facility] Patient Information 2014 [place], [place].

## 2013-09-10 NOTE — Progress Notes (Signed)
Coumadin follow up  Patient is on Coumadin for afib . Patient's last INR is 2.87. Coumadin 5 mg 4 days a week and 1/2 (2.5mg ) 3 days a week.  Patient denies SOB, CP, dizziness, nose bleeds, easy bleeding, and blood in stool/urine.  Patient states that he has not been swelling like he normally does and kidney function has been good.  Past Medical History  Diagnosis Date  . History of artificial heart valve   . Cataracts, bilateral   . OSA (obstructive sleep apnea)   . Atrial fibrillation and flutter   . Ischemic cardiomyopathy   . History of echocardiogram 04/17/2013    EF 40-45%; LV hypertrophy; LV mild-mod dilated; AV regurg.; LA severely diated, RA mildly dilated  . History of Doppler ultrasound 04/05/2013    LEAs; small distal abd. aoritc aneuysm is stable; fem-pop graft to R leg has 50-69% blockage  . History of nuclear stress test 05/14/2011    Dipyridamole; moderate perfusion defect in Basal Infrioer & Mid Inferior region - consistent w/infarct or scar; global LV systolic function mildly reduced; EKG negative for ischemia; low risk scan  . COPD (chronic obstructive pulmonary disease)   . Hyperlipidemia   . Hypertension   . Kidney cysts   . CKD (chronic kidney disease)   . Hiatal hernia   . GERD (gastroesophageal reflux disease)   . Prediabetes   . Arteriosclerotic heart disease (ASHD)   . Gout   . OSA (obstructive sleep apnea)   . AAA (abdominal aortic aneurysm)   . Vitamin B deficiency     Current Outpatient Prescriptions on File Prior to Visit  Medication Sig Dispense Refill  . bumetanide (BUMEX) 2 MG tablet Take 2 mg by mouth daily.      . cetirizine (ZYRTEC) 10 MG tablet Take 10 mg by mouth daily.      . Cholecalciferol (VITAMIN D3) 10000 UNITS capsule Take 10,000 Units by mouth daily.      . citalopram (CELEXA) 40 MG tablet Take 40 mg by mouth daily.       . clonazePAM (KLONOPIN) 1 MG tablet Take 1 mg by mouth at bedtime as needed.       . Cyanocobalamin (VITAMIN B 12  PO) Take 1,500 mg by mouth.      . diltiazem (TIAZAC) 120 MG 24 hr capsule TAKE 1 TABLET BY MOUTH EVERY DAY  30 capsule  11  . fenofibrate micronized (LOFIBRA) 134 MG capsule Take 134 mg by mouth daily before breakfast.       . ipratropium-albuterol (DUONEB) 0.5-2.5 (3) MG/3ML SOLN 3 mLs every 6 (six) hours as needed.       . isosorbide mononitrate (IMDUR) 30 MG 24 hr tablet Take 30 mg by mouth daily.       [name] levothyroxine (SYNTHROID, LEVOTHROID) 50 MCG tablet Take 50 mcg by mouth daily before breakfast.      . losartan (COZAAR) 100 MG tablet Take 100 mg by mouth daily.      . nitroGLYCERIN (NITROSTAT) 0.4 MG SL tablet Place 0.4 mg under the tongue every 5 (five) minutes as needed for chest pain.      . pravastatin (PRAVACHOL) 40 MG tablet Take 40 mg by mouth daily.      [name] warfarin (COUMADIN) 5 MG tablet Take 5 mg by mouth daily.        No current facility-administered medications on file prior to visit.   Allergies  Allergen Reactions  . Chantix [Varenicline] Nausea And Vomiting  .  Nsaids   . Simvastatin     Pt said he had a "bleed out" after taking it  . Ziac [Bisoprolol-Hydrochlorothiazide]     fatigue    ROS Constitutional: Denies fever, chills, headaches, fatigue. Cardio: Denies chest pain, palpitations, irregular heartbeat, syncope, dyspnea, diaphoresis, orthopnea, PND, claudication, edema Respiratory: denies cough, dyspnea, DOE, pleurisy, hoarseness, laryngitis, wheezing.  Gastrointestinal: Denies dysphagia, heartburn, reflux, pain, cramps, nausea, diarrhea, constipation, hematemesis, melena, hematochezia Genitourinary: Denies dysuria, frequency, hematuria, flank pain Musculoskeletal: Denies arthralgia, myalgia, stiffness, Jt. Swelling, pain, limp, strain/sprain. Skin: Denies rash, ecchymosis, petechial. Neuro: Weakness, tremor, incoordination, spasms, paresthesia, pain Heme/Lymph: Excessive bleeding, bruising, enlarged lymph nodes  Physical: Filed Vitals:   09/10/13 1447   Pulse: 56  Temp: 97.9 F (36.6 C)  Resp: 16   Filed Weights   09/10/13 1447  Weight: 229 lb (103.874 kg)    General Appearance: Well nourished, in no apparent distress. ENT/Mouth: Nares clear with no erythema, swelling, mucus on turbinates. No ulcers, cracking, on lips. No erythema, swelling, or exudate on post pharynx.  Neck: Supple, thyroid normal.  Respiratory: CTAB   Cardio: Irregular, irregular rhythm, with mechanical click, systolic murmur without rubs or gallops. 1+ pulses on left foot at TP and DP. Cold with decreased hair.  Abdomen: Soft, with bowl sounds. Non tender, no guarding, rebound, hernias, masses, or organomegaly.  Skin: Warm, dry without rashes, lesions, ecchymosis.  Neuro: Unremarkable  Assessment and plan: Chronic anticoagulation- check INR and will adjust medication according to labs.  Discussed if patient falls to immediately contact office or go to ER. Discussed foods that can increase or decrease Coumadin levels. Patient understands to call the office before starting a new medication. Follow up in one month.   Right foot pain- Follow up with cardio for claudicaiton Lyrica samples given for possible neuropathy

## 2013-09-24 ENCOUNTER — Encounter: Payer: Self-pay | Admitting: Internal Medicine

## 2013-10-02 ENCOUNTER — Encounter: Payer: Self-pay | Admitting: Internal Medicine

## 2013-10-02 ENCOUNTER — Ambulatory Visit (INDEPENDENT_AMBULATORY_CARE_PROVIDER_SITE_OTHER): Payer: Medicare Other | Admitting: Internal Medicine

## 2013-10-02 VITALS — BP 138/72 | HR 59 | Ht 68.0 in | Wt 229.2 lb

## 2013-10-02 DIAGNOSIS — L97909 Non-pressure chronic ulcer of unspecified part of unspecified lower leg with unspecified severity: Secondary | ICD-10-CM

## 2013-10-02 DIAGNOSIS — E7800 Pure hypercholesterolemia, unspecified: Secondary | ICD-10-CM

## 2013-10-02 DIAGNOSIS — I10 Essential (primary) hypertension: Secondary | ICD-10-CM

## 2013-10-02 DIAGNOSIS — I359 Nonrheumatic aortic valve disorder, unspecified: Secondary | ICD-10-CM

## 2013-10-02 DIAGNOSIS — I739 Peripheral vascular disease, unspecified: Secondary | ICD-10-CM

## 2013-10-02 DIAGNOSIS — I2510 Atherosclerotic heart disease of native coronary artery without angina pectoris: Secondary | ICD-10-CM

## 2013-10-02 DIAGNOSIS — I999 Unspecified disorder of circulatory system: Secondary | ICD-10-CM

## 2013-10-02 DIAGNOSIS — I714 Abdominal aortic aneurysm, without rupture: Secondary | ICD-10-CM

## 2013-10-02 DIAGNOSIS — Z951 Presence of aortocoronary bypass graft: Secondary | ICD-10-CM

## 2013-10-02 NOTE — Patient Instructions (Signed)
Your physician has requested that you have a lower extremity arterial duplex. This test is an ultrasound of the arteries in the legs. It looks at arterial blood flow in the legs. Allow one hour for Lower Arterial scans. There are no restrictions or special instructions  Your physician wants you to follow-up in: 6 months. You will receive a reminder letter in the mail two months in advance. If you don't receive a letter, please call our office to schedule the follow-up appointment.   

## 2013-10-03 ENCOUNTER — Encounter: Payer: Self-pay | Admitting: Internal Medicine

## 2013-10-03 DIAGNOSIS — L97909 Non-pressure chronic ulcer of unspecified part of unspecified lower leg with unspecified severity: Secondary | ICD-10-CM | POA: Insufficient documentation

## 2013-10-03 NOTE — Progress Notes (Signed)
OFFICE NOTE  Chief Complaint:  Weight gain, routine follow-up  Primary Care Physician: [name], MD  HPI:  Robert Stanton is a 72 year old gentleman with a history of CABG in 2000 with a LIMA to the LAD. All vein grafts were noted to be occluded, and he had a St. Jude AVR which is notably functioning. EF is about 40-45% in 2012. He also has sleep apnea, COPD, and recurrent atrial fibrillation/flutter. He had cardioversion in 2012 but is back in atrial fibrillation, and I started him on amiodarone with the plan of cardioversion. He at his last visit actually declined cardioversion, however, we have kept him on amiodarone because it has seemed to benefit him as far as reducing multiple PVCs which were previously noted. Today he continues to have sinus bradycardia despite decreasing his Cardizem at the last visit from 180 mg to 120 mg daily. The heart rate remains around 48 with a significant intraventricular conduction delay suggesting underlying conduction disease. He reports a recent gout attack, and was given the lower. He's also had significant weight gain and lower extremity fluid retention. This could be due to worsening heart failure. He continues to be bradycardic, with interventricular conduction delay. I've been decreasing his amiodarone intake is about time that we discontinue it.  At his last office visit, I discontinued his amiodarone. We also increased his Bumex slightly and obtain an echocardiogram. The echocardiogram showed a stable EF of 40-45% with some mildly elevated filling pressures. Overall, there was no significant change from the prior study. 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 The renal cortex and medulla appeared bilaterally without hydronephrosis.  He does report some improvement in his swelling with the increased dose of Bumex. However, his shortness of breath is fairly stable without  improvement.  He has noted a sore that developed between the right fourth and fifth digits on the right foot. This appears to be an arterial ulcer. He did have arterial Dopplers performed in February of 2013. This showed a reduced ABI of 0.75 on the right and 1.1 on the left. The right SFA is occluded and the left SFA is occluded. There is a patent right to left fem-fem bypass graft. Right runoff was noted to be poor.  PMHx:  Past Medical History  Diagnosis Date  . History of artificial heart valve   . Cataracts, bilateral   . OSA (obstructive sleep apnea)   . Atrial fibrillation and flutter   . Ischemic cardiomyopathy   . History of echocardiogram 04/17/2013    EF 40-45%; LV hypertrophy; LV mild-mod dilated; AV regurg.; LA severely diated, RA mildly dilated  . History of Doppler ultrasound 04/05/2013    LEAs; small distal abd. aoritc aneuysm is stable; fem-pop graft to R leg has 50-69% blockage  . History of nuclear stress test 05/14/2011    Dipyridamole; moderate perfusion defect in Basal Infrioer & Mid Inferior region - consistent w/infarct or scar; global LV systolic function mildly reduced; EKG negative for ischemia; low risk scan  . COPD (chronic obstructive pulmonary disease)   . Hyperlipidemia   . Hypertension   . Kidney cysts   . CKD (chronic kidney disease)   . Hiatal hernia   . GERD (gastroesophageal reflux disease)   . Prediabetes   . Arteriosclerotic heart disease (ASHD)   . Gout   . OSA (obstructive sleep apnea)   . AAA (abdominal aortic aneurysm)   . Vitamin B deficiency  Past Surgical History  Procedure Laterality Date  . Stents in kidneys    . Coronary artery bypass graft  2000    3 vessel; LIMA to LAD; RCA, OM  . Shunts in femoral arteries    . Cardioversion  8/282012  . Aortic valve replacement      St. Jude  . Femoral bypass    . Cardiac catheterization  11/25/2008    loss of 2/3 (RCA, OM) bypass grafts with patent internal mammary artery to LAD; large  collateral filling of RCA ; osteal narrowing of circumflex of ~50%    FAMHx:  Family History  Problem Relation Age of Onset  . Heart attack Father   . Heart attack Mother   . Lung cancer Sister     x 2  . Kidney cancer Sister   . Kidney disease Sister   . Cancer Brother     ?  . Stroke Brother     SOCHx:   reports that he has been smoking Cigarettes.  He has a 20 pack-year smoking history. He has never used smokeless tobacco. He reports that he does not drink alcohol or use illicit drugs.  ALLERGIES:  Allergies  Allergen Reactions  . Chantix [Varenicline] Nausea And Vomiting  . Lyrica [Pregabalin]     Swelling, couldn't breathe  . Nsaids   . Simvastatin     Pt said he had a "bleed out" after taking it  . Ziac [Bisoprolol-Hydrochlorothiazide]     fatigue    ROS: Integument/breast: ulcer  HOME MEDS: Current Outpatient Prescriptions  Medication Sig Dispense Refill  . albuterol (PROVENTIL) (2.5 MG/3ML) 0.083% nebulizer solution Take 2.5 mg by nebulization every 6 (six) hours as needed for wheezing or shortness of breath.      . bumetanide (BUMEX) 2 MG tablet Take 2 mg by mouth daily.      . cetirizine (ZYRTEC) 10 MG tablet Take 10 mg by mouth daily.      . Cholecalciferol (VITAMIN D3) 10000 UNITS capsule Take 10,000 Units by mouth daily.      . citalopram (CELEXA) 40 MG tablet Take 40 mg by mouth daily.       . clonazePAM (KLONOPIN) 1 MG tablet Take 1 mg by mouth 3 (three) times daily as needed.      . Cyanocobalamin (VITAMIN B 12 PO) Take 1,500 mg by mouth.      . diltiazem (TIAZAC) 120 MG 24 hr capsule TAKE 1 TABLET BY MOUTH EVERY DAY  30 capsule  11  . febuxostat (ULORIC) 40 MG tablet Take 40 mg by mouth daily.      . fenofibrate micronized (LOFIBRA) 134 MG capsule Take 134 mg by mouth daily before breakfast.       . Ferrous Sulfate Dried (FERROUS SULFATE CR PO) Take by mouth daily.      [name] ipratropium-albuterol (DUONEB) 0.5-2.5 (3) MG/3ML SOLN 3 mLs every 6 (six)  hours as needed.       . isosorbide mononitrate (IMDUR) 30 MG 24 hr tablet Take 30 mg by mouth daily.       [name] levothyroxine (SYNTHROID, LEVOTHROID) 50 MCG tablet Take 50 mcg by mouth daily before breakfast.      . nitroGLYCERIN (NITROSTAT) 0.4 MG SL tablet Place 0.4 mg under the tongue every 5 (five) minutes as needed for chest pain.      . pravastatin (PRAVACHOL) 40 MG tablet Take 40 mg by mouth daily.      [name] tiotropium (SPIRIVA) 18 MCG inhalation  capsule Place 18 mcg into inhaler and inhale daily.      [name] warfarin (COUMADIN) 5 MG tablet Take 5 mg by mouth daily.        No current facility-administered medications for this visit.    LABS/IMAGING: No results found for this or any previous visit (from the past 48 hour(s)). No results found.  VITALS: BP 138/72  Pulse 59  Ht 5\' 8"  (1.727 m)  Wt 229 lb 3.2 oz (103.964 kg)  BMI 34.86 kg/m2  EXAM: Atrial fibrillation with slow ventricular response at 59  EKG: deferred  ASSESSMENT: 1. Atrial fibrillation with slow ventricular response 2. Weight gain and lower extremity edema 3. Ischemic cardiomyopathy EF 40-45% 4. Aortic stenosis status post St. Jude aortic valve replacement 5. 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 Large bilateral femoropopliteal grafts, without any sign of aneurysm, and preserved ABIs bilaterally 8. 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   Robert Stanton now has had the development of an ulcer between the right fourth and fifth toes. This is concerning for worsening lower extremity circulation. I would like to repeat his peripheral arterial Dopplers to see if there are possibly any interventional options. In addition I'll refer him to Dr. Allyson Sabal for evaluation.  Chrystie Nose, MD, [facility] Attending Cardiologist [facility]  Jalaysha Skilton C 10/03/2013, 3:58  PM

## 2013-10-04 ENCOUNTER — Encounter: Payer: Self-pay | Admitting: Internal Medicine

## 2013-10-05 ENCOUNTER — Ambulatory Visit (HOSPITAL_COMMUNITY)
Admission: RE | Admit: 2013-10-05 | Discharge: 2013-10-05 | Disposition: A | Discharge status: Home / Self Care | Payer: Medicare Other | Source: Ambulatory Visit | Attending: Cardiovascular Disease | Admitting: Cardiovascular Disease

## 2013-10-05 DIAGNOSIS — I739 Peripheral vascular disease, unspecified: Secondary | ICD-10-CM | POA: Insufficient documentation

## 2013-10-05 DIAGNOSIS — L97909 Non-pressure chronic ulcer of unspecified part of unspecified lower leg with unspecified severity: Secondary | ICD-10-CM

## 2013-10-05 NOTE — Progress Notes (Signed)
Lower Extremity Arterial Duplex Completed. °Brianna L Mazza,RVT °

## 2013-10-08 ENCOUNTER — Ambulatory Visit (INDEPENDENT_AMBULATORY_CARE_PROVIDER_SITE_OTHER): Payer: Medicare Other | Admitting: Emergency Medicine

## 2013-10-08 ENCOUNTER — Encounter: Payer: Self-pay | Admitting: Emergency Medicine

## 2013-10-08 VITALS — BP 114/70 | HR 58 | Temp 98.2°F | Resp 18 | Ht 68.0 in | Wt 230.0 lb

## 2013-10-08 DIAGNOSIS — R799 Abnormal finding of blood chemistry, unspecified: Secondary | ICD-10-CM

## 2013-10-08 DIAGNOSIS — J309 Allergic rhinitis, unspecified: Secondary | ICD-10-CM

## 2013-10-08 DIAGNOSIS — Z7901 Long term (current) use of anticoagulants: Secondary | ICD-10-CM

## 2013-10-08 DIAGNOSIS — R7989 Other specified abnormal findings of blood chemistry: Secondary | ICD-10-CM

## 2013-10-08 DIAGNOSIS — I2510 Atherosclerotic heart disease of native coronary artery without angina pectoris: Secondary | ICD-10-CM

## 2013-10-08 DIAGNOSIS — R05 Cough: Secondary | ICD-10-CM

## 2013-10-08 LAB — BASIC METABOLIC PANEL WITH GFR
BUN: 27 mg/dL — ABNORMAL HIGH (ref 6–23)
CO2: 28 mEq/L (ref 19–32)
GFR, Est African American: 43 mL/min — ABNORMAL LOW
GFR, Est Non African American: 37 mL/min — ABNORMAL LOW
Glucose, Bld: 95 mg/dL (ref 70–99)
Potassium: 4.3 mEq/L (ref 3.5–5.3)

## 2013-10-08 LAB — CBC WITH DIFFERENTIAL/PLATELET
Basophils Absolute: 0 10*3/uL (ref 0.0–0.1)
Basophils Relative: 0 % (ref 0–1)
Hemoglobin: 12.9 g/dL — ABNORMAL LOW (ref 13.0–17.0)
MCHC: 33.1 g/dL (ref 30.0–36.0)
MCV: 92.6 fL (ref 78.0–100.0)
Monocytes Relative: 11 % (ref 3–12)
Neutro Abs: 4.1 10*3/uL (ref 1.7–7.7)
Neutrophils Relative %: 72 % (ref 43–77)
Platelets: 209 10*3/uL (ref 150–400)
RDW: 14.4 % (ref 11.5–15.5)

## 2013-10-08 MED ORDER — WARFARIN SODIUM 5 MG PO TABS: 5.0000 mg | ORAL_TABLET | Freq: Every day | ORAL | Status: DC

## 2013-10-08 MED ORDER — BENZONATATE 100 MG PO CAPS: 100.0000 mg | ORAL_CAPSULE | Freq: Three times a day (TID) | ORAL | Status: DC | PRN

## 2013-10-08 MED ORDER — AZITHROMYCIN 250 MG PO TABS: ORAL_TABLET | ORAL | Status: AC

## 2013-10-08 NOTE — Patient Instructions (Addendum)
Stasis Ulcer  A stasis ulcer is a sore on the skin. It occurs in the legs when your blood flow is damaged.  HOME CARE  Do not stand or sit in one position for a long time. Do not sit with your legs crossed. Raise (elevate) your legs.  Wear elastic stockings (compression stockings). Do not wear tight clothing around the legs or waist area. Use and apply bandages (dressings) as told.  Walk as much as possible. If you take long car or plane rides, take a break to walk around every 2 hours.  Only take medicine as told by your doctor.  Raise the end of your bed with 2-inch blocks only if your doctor says it is okay.  If you cut the skin on your leg, lie down and raise your leg. Gently clean the area with a clean cloth. Then, put pressure on the cut until the bleeding stops. Put a bandage on.  Keep all doctor visits. GET HELP RIGHT AWAY IF:  The ulcer area starts to break down.  You have pain, redness, or tenderness in or around the ulcer.  You have yellowish-white fluid (pus) or hard puffiness (swelling) in or around the ulcer.  Your pain gets worse.  You get a fever.  You have chest pain or shortness of breath. MAKE SURE YOU:  Understand these instructions.  Will watch your condition.  Will get help right away if you are not doing well or get worse. Document Released: 11/11/2004 Document Revised: 01/29/2013 Document Reviewed: 02/02/2011 [facility] Patient Information 2014 [place], [place]. Allergic Rhinitis Allergic rhinitis is when the mucous membranes in the nose respond to allergens. Allergens are particles in the air that cause your body to have an allergic reaction. This causes you to release allergic antibodies. Through a chain of events, these eventually cause you to release histamine into the blood stream (hence the use of antihistamines). Although meant to be protective to the body, it is this release that causes your discomfort, such as frequent sneezing, congestion and an  itchy runny nose.  CAUSES  The pollen allergens may come from grasses, trees, and weeds. This is seasonal allergic rhinitis, or "hay fever." Other allergens cause year-round allergic rhinitis (perennial allergic rhinitis) such as house dust mite allergen, pet dander and mold spores.  SYMPTOMS  Nasal stuffiness (congestion). Runny, itchy nose with sneezing and tearing of the eyes. There is often an itching of the mouth, eyes and ears. It cannot be cured, but it can be controlled with medications. DIAGNOSIS  If you are unable to determine the offending allergen, skin or blood testing may find it. TREATMENT  Avoid the allergen. Medications and allergy shots (immunotherapy) can help. Hay fever may often be treated with antihistamines in pill or nasal spray forms. Antihistamines block the effects of histamine. There are over-the-counter medicines that may help with nasal congestion and swelling around the eyes. Check with your caregiver before taking or giving this medicine. If the treatment above does not work, there are many new medications your caregiver can prescribe. Stronger medications may be used if initial measures are ineffective. Desensitizing injections can be used if medications and avoidance fails. Desensitization is when a patient is given ongoing shots until the body becomes less sensitive to the allergen. Make sure you follow up with your caregiver if problems continue. SEEK MEDICAL CARE IF:  You develop fever (more than 100.5 F (38.1 C). You develop a cough that does not stop easily (persistent). You have shortness of breath.  You start wheezing. Symptoms interfere with normal daily activities. Document Released: 06/29/2001 Document Revised: 12/27/2011 Document Reviewed: 01/08/2009 [facility] Patient Information 2014 [place], [place].

## 2013-10-09 ENCOUNTER — Other Ambulatory Visit: Payer: Medicare Other

## 2013-10-09 DIAGNOSIS — Z79899 Other long term (current) drug therapy: Secondary | ICD-10-CM

## 2013-10-09 NOTE — Progress Notes (Signed)
Subjective:    Patient ID: Robert Stanton, male    DOB: 01/25/1941, 72 y.o.   MRN: [id number]  HPI Comments: 72 yo male presents  for ASHD prophylaxis with Coumadin. He denies bruising/ bleeding or changes with dosing. He has been seeing CV wit small ulcer between right 4-5th toes. He notes improvement with soaks and elevation. He has f/u scheduled for re-eval.   He has been trying to increase h2o with elevated BMP and has been slowly improving with labs.   He has noticed more allergy drainage and raspy voice increase over last week or so. He denies production with cough or nasal of any color.    Current Outpatient Prescriptions on File Prior to Visit  Medication Sig Dispense Refill  . albuterol (PROVENTIL) (2.5 MG/3ML) 0.083% nebulizer solution Take 2.5 mg by nebulization every 6 (six) hours as needed for wheezing or shortness of breath.      . bumetanide (BUMEX) 2 MG tablet Take 2 mg by mouth daily.      . cetirizine (ZYRTEC) 10 MG tablet Take 10 mg by mouth daily.      . Cholecalciferol (VITAMIN D3) 10000 UNITS capsule Take 10,000 Units by mouth daily.      . citalopram (CELEXA) 40 MG tablet Take 40 mg by mouth daily.       . clonazePAM (KLONOPIN) 1 MG tablet Take 1 mg by mouth 3 (three) times daily as needed.      . Cyanocobalamin (VITAMIN B 12 PO) Take 1,500 mg by mouth.      . diltiazem (TIAZAC) 120 MG 24 hr capsule TAKE 1 TABLET BY MOUTH EVERY DAY  30 capsule  11  . febuxostat (ULORIC) 40 MG tablet Take 40 mg by mouth daily.      . fenofibrate micronized (LOFIBRA) 134 MG capsule Take 134 mg by mouth daily before breakfast.       . Ferrous Sulfate Dried (FERROUS SULFATE CR PO) Take by mouth daily.      [name] ipratropium-albuterol (DUONEB) 0.5-2.5 (3) MG/3ML SOLN 3 mLs every 6 (six) hours as needed.       . isosorbide mononitrate (IMDUR) 30 MG 24 hr tablet Take 30 mg by mouth daily.       [name] levothyroxine (SYNTHROID, LEVOTHROID) 50 MCG tablet Take 50 mcg by mouth daily before  breakfast.      . nitroGLYCERIN (NITROSTAT) 0.4 MG SL tablet Place 0.4 mg under the tongue every 5 (five) minutes as needed for chest pain.      . pravastatin (PRAVACHOL) 40 MG tablet Take 40 mg by mouth daily.      [name] tiotropium (SPIRIVA) 18 MCG inhalation capsule Place 18 mcg into inhaler and inhale daily.       No current facility-administered medications on file prior to visit.   ALLERGIES Chantix; Lyrica; Nsaids; Simvastatin; and Ziac  Past Medical History  Diagnosis Date  . History of artificial heart valve   . Cataracts, bilateral   . OSA (obstructive sleep apnea)   . Atrial fibrillation and flutter   . Ischemic cardiomyopathy   . History of echocardiogram 04/17/2013    EF 40-45%; LV hypertrophy; LV mild-mod dilated; AV regurg.; LA severely diated, RA mildly dilated  . History of Doppler ultrasound 04/05/2013    LEAs; small distal abd. aoritc aneuysm is stable; fem-pop graft to R leg has 50-69% blockage  . History of nuclear stress test 05/14/2011    Dipyridamole; moderate perfusion defect in Basal Infrioer & Mid  Inferior region - consistent w/infarct or scar; global LV systolic function mildly reduced; EKG negative for ischemia; low risk scan  . COPD (chronic obstructive pulmonary disease)   . Hyperlipidemia   . Hypertension   . Kidney cysts   . CKD (chronic kidney disease)   . Hiatal hernia   . GERD (gastroesophageal reflux disease)   . Prediabetes   . Arteriosclerotic heart disease (ASHD)   . Gout   . OSA (obstructive sleep apnea)   . AAA (abdominal aortic aneurysm)   . Vitamin B deficiency      Review of Systems  HENT: Positive for postnasal drip.   Respiratory: Positive for cough.   All other systems reviewed and are negative.   BP 114/70  Pulse 58  Temp(Src) 98.2 F (36.8 C) (Temporal)  Resp 18  Ht 5\' 8"  (1.727 m)  Wt 230 lb (104.327 kg)  BMI 34.98 kg/m2     Objective:   Physical Exam  Nursing note and vitals reviewed. Constitutional: He is oriented  to person, place, and time. He appears well-developed and well-nourished.  HENT:  Head: Normocephalic and atraumatic.  Right Ear: External ear normal.  Left Ear: External ear normal.  Nose: Nose normal.  Mouth/Throat: Oropharynx is clear and moist. No oropharyngeal exudate.  TMs Cloudy, raspy voice  Eyes: Conjunctivae and EOM are normal.  Neck: Normal range of motion. Neck supple. No JVD present. No thyromegaly present.  Cardiovascular: Normal rate, regular rhythm, normal heart sounds and intact distal pulses.   Pulmonary/Chest: Effort normal and breath sounds normal.  Abdominal: Soft. Bowel sounds are normal. He exhibits no distension and no mass. There is no tenderness. There is no rebound and no guarding.  Musculoskeletal: Normal range of motion. He exhibits no edema and no tenderness.  Lymphadenopathy:    He has no cervical adenopathy.  Neurological: He is alert and oriented to person, place, and time. He has normal reflexes. No cranial nerve deficit. Coordination normal.  Skin: Skin is warm and dry.  Psychiatric: He has a normal mood and affect. His behavior is normal. Judgment and thought content normal.          Assessment & Plan:  1. ASHD prophylaxis- Check labs, Call office with any unusual bleeding/ bruising/ weakness.  2. Right foot Ulcer- continue treatment and f/u AD with increase with activity and elevation. 3. Abn BMP- recheck increase h2o 4. Cough/ Allergic rhinitis- Allegra OTC, increase H2o, allergy hygiene explained. Tessalon perles AD and NEbs AD, with Tobacco hx Zpak if SX increase AD. Advised needs d/c tobacco

## 2013-10-10 LAB — PROTIME-INR
INR: 1.74 — ABNORMAL HIGH (ref <1.50)
Prothrombin Time: 20 seconds — ABNORMAL HIGH (ref 11.6–15.2)

## 2013-11-02 ENCOUNTER — Encounter: Payer: Self-pay | Admitting: Cardiovascular Disease

## 2013-11-02 ENCOUNTER — Ambulatory Visit (INDEPENDENT_AMBULATORY_CARE_PROVIDER_SITE_OTHER): Payer: Medicare Other | Admitting: Cardiovascular Disease

## 2013-11-02 VITALS — BP 159/72 | HR 54 | Ht 68.0 in | Wt 232.0 lb

## 2013-11-02 DIAGNOSIS — R5381 Other malaise: Secondary | ICD-10-CM

## 2013-11-02 DIAGNOSIS — D689 Coagulation defect, unspecified: Secondary | ICD-10-CM

## 2013-11-02 DIAGNOSIS — Z79899 Other long term (current) drug therapy: Secondary | ICD-10-CM

## 2013-11-02 DIAGNOSIS — R5383 Other fatigue: Secondary | ICD-10-CM

## 2013-11-02 DIAGNOSIS — I739 Peripheral vascular disease, unspecified: Secondary | ICD-10-CM | POA: Diagnosis not present

## 2013-11-02 NOTE — Assessment & Plan Note (Signed)
Patient has a history of right to left fem-fem bypass grafting and bilateral femoropopliteal bypass grafts. He also has what sounds like 8 abdominal aortic aneurysm. Recent lower extremity arterial Doppler studies performed 10/05/13 revealed a right ABI of Lasix for a left of 1.1. It does appear that the fem-fem crossover graft is patent as are the femoropopliteal bypass graft who has a high-frequency signal in his right external iliac artery jeopardizing both lower extremities. He does have claudication. His other problems include chronic atrial fibrillation and St. Jude aVR on Coumadin and the coagulation as well as chronic renal insufficiency with a creatinine in the 1.2 range. He Lovenox bridging for his peripheral arteriogram as well as admission the day before for hydration.Robert Stanton

## 2013-11-02 NOTE — Progress Notes (Signed)
11/02/2013 Robert Stanton   1941/03/07  [id number]  Primary Physician Robert DAVID, MD Primary Cardiologist: Robert Harp MD [place], [place]   HPI:  Robert Stanton is a 73 year old gentleman with a history of CABG in 2000 with a LIMA to the LAD. All vein grafts were noted to be occluded, and he had a St. Jude AVR which is notably functioning. EF is about 40-45% in 2012. He also has sleep apnea, COPD, and recurrent atrial fibrillation/flutter. He had cardioversion in 2012 but is back in atrial fibrillation, and I started him on amiodarone with the plan of cardioversion. He at his last visit actually declined cardioversion, however, we have kept him on amiodarone because it has seemed to benefit him as far as reducing multiple PVCs which were previously noted. Today he continues to have sinus bradycardia despite decreasing his Cardizem at the last visit from 180 mg to 120 mg daily. The heart rate remains around 48 with a significant intraventricular conduction delay suggesting underlying conduction disease. He reports a recent gout attack, and was given the lower. He's also had significant weight gain and lower extremity fluid retention. This could be due to worsening heart failure. He continues to be bradycardic, with interventricular conduction delay. I've been decreasing his amiodarone intake is about time that we discontinue it. He saw Dr. Darene Stanton. 10/03/13 referred him to me for peripheral vascular evaluation. He does have a small abdominal aortic aneurysm as well as severe peripheral vascular occlusive disease. He has occluded left iliac system status post right-to-left fem-fem crossover graft as well as bilateral femoropopliteal bypass grafts. He has a right ABI 0.64 with what appears to be a significant lesion in his right external iliac artery jeopardizing both lower extremities. He does have lifestyle limiting claudication. A small ulcer on his right foot which has  subsequently healed.    Current Outpatient Prescriptions  Medication Sig Dispense Refill  . albuterol (PROVENTIL) (2.5 MG/3ML) 0.083% nebulizer solution Take 2.5 mg by nebulization every 6 (six) hours as needed for wheezing or shortness of breath.      . benzonatate (TESSALON PERLES) 100 MG capsule Take 1 capsule (100 mg total) by mouth 3 (three) times daily as needed for cough.  30 capsule  1  . bumetanide (BUMEX) 2 MG tablet Take 2 mg by mouth daily.      . cetirizine (ZYRTEC) 10 MG tablet Take 10 mg by mouth daily.      . Cholecalciferol (VITAMIN D3) 10000 UNITS capsule Take 10,000 Units by mouth daily.      . citalopram (CELEXA) 40 MG tablet Take 40 mg by mouth daily.       . clonazePAM (KLONOPIN) 1 MG tablet Take 1 mg by mouth 3 (three) times daily as needed.      . Cyanocobalamin (VITAMIN B 12 PO) Take 1,500 mg by mouth.      . diltiazem (TIAZAC) 120 MG 24 hr capsule TAKE 1 TABLET BY MOUTH EVERY DAY  30 capsule  11  . febuxostat (ULORIC) 40 MG tablet Take 40 mg by mouth daily.      . fenofibrate micronized (LOFIBRA) 134 MG capsule Take 134 mg by mouth daily before breakfast.       . Ferrous Sulfate Dried (FERROUS SULFATE CR PO) Take by mouth daily.      [name] ipratropium-albuterol (DUONEB) 0.5-2.5 (3) MG/3ML SOLN 3 mLs every 6 (six) hours as needed.       . isosorbide mononitrate (IMDUR) 30  MG 24 hr tablet Take 30 mg by mouth daily.       [name] levothyroxine (SYNTHROID, LEVOTHROID) 50 MCG tablet Take 50 mcg by mouth daily before breakfast.      . nitroGLYCERIN (NITROSTAT) 0.4 MG SL tablet Place 0.4 mg under the tongue every 5 (five) minutes as needed for chest pain.      . pravastatin (PRAVACHOL) 40 MG tablet Take 40 mg by mouth daily.      [name] tiotropium (SPIRIVA) 18 MCG inhalation capsule Place 18 mcg into inhaler and inhale daily.      [name] warfarin (COUMADIN) 5 MG tablet Take 1 tablet (5 mg total) by mouth daily.  90 tablet  2   No current facility-administered medications for this visit.     Allergies  Allergen Reactions  . Chantix [Varenicline] Nausea And Vomiting  . Lyrica [Pregabalin]     Swelling, couldn't breathe  . Nsaids   . Simvastatin     Pt said he had a "bleed out" after taking it  . Ziac [Bisoprolol-Hydrochlorothiazide]     fatigue    History   Social History  . Marital Status: Married    Spouse Name: N/A    Number of Children: 3  . Years of Education: N/A   Occupational History  . Not on file.   Social History Main Topics  . Smoking status: Current Every Day Smoker -- 0.50 packs/day for 40 years    Types: Cigarettes  . Smokeless tobacco: Never Used  . Alcohol Use: No  . Drug Use: No  . Sexual Activity: Not on file   Other Topics Concern  . Not on file   Social History Narrative  . No narrative on file     Review of Systems: General: negative for chills, fever, night sweats or weight changes.  Cardiovascular: negative for chest pain, dyspnea on exertion, edema, orthopnea, palpitations, paroxysmal nocturnal dyspnea or shortness of breath Dermatological: negative for rash Respiratory: negative for cough or wheezing Urologic: negative for hematuria Abdominal: negative for nausea, vomiting, diarrhea, bright red blood per rectum, melena, or hematemesis Neurologic: negative for visual changes, syncope, or dizziness All other systems reviewed and are otherwise negative except as noted above.    Blood pressure 159/72, pulse 54, height 5\' 8"  (1.727 m), weight 232 lb (105.235 kg).  General appearance: alert and no distress Neck: no adenopathy, no carotid bruit, no JVD, supple, symmetrical, trachea midline and thyroid not enlarged, symmetric, no tenderness/mass/nodules Lungs: clear to auscultation bilaterally Heart: irregularly irregular rhythm Extremities: extremities normal, atraumatic, no cyanosis or edema  EKG not performed today  ASSESSMENT AND PLAN:   PAD (peripheral artery disease) Patient has a history of right to left fem-fem  bypass grafting and bilateral femoropopliteal bypass grafts. He also has what sounds like 8 abdominal aortic aneurysm. Recent lower extremity arterial Doppler studies performed 10/05/13 revealed a right ABI of Lasix for a left of 1.1. It does appear that the fem-fem crossover graft is patent as are the femoropopliteal bypass graft who has a high-frequency signal in his right external iliac artery jeopardizing both lower extremities. He does have claudication. His other problems include chronic atrial fibrillation and St. Jude aVR on Coumadin and the coagulation as well as chronic renal insufficiency with a creatinine in the 1.2 range. He Lovenox bridging for his peripheral arteriogram as well as admission the day before for hydration.Robert Harp MD FACP,FACC,FAHA, [facility] 11/02/2013 4:10 PM

## 2013-11-02 NOTE — Patient Instructions (Signed)
You will receive a phone call from [facility] the day before the procedure.  When they call you, please report to the hospital so that you can begin receiving IV fluids to prepare your kidneys for the upcoming catheterization.  If you have not received a phone call from the hospital by 2pm, please call our office and let us know.       Dr. Gwenlyn Found has ordered a peripheral angiogram to be done at [facility].  This procedure is going to look at the bloodflow in your lower extremities.  If Dr. Gwenlyn Found is able to open up the arteries, you will have to spend one night in the hospital.  If he is not able to open the arteries, you will be able to go home that same day.    After the procedure, you will not be allowed to drive for 3 days or push, pull, or lift anything greater than 10 lbs for one week.    You will be required to have bloodwork prior to your procedure.  Our scheduler will advise you on when this item needs to be done.      You will need lovenox bridging prior to the procedure.  Dr Louanne Belton will advise.

## 2013-11-05 ENCOUNTER — Telehealth: Payer: Self-pay | Admitting: *Deleted

## 2013-11-05 NOTE — Telephone Encounter (Signed)
I spoke with Wells Guiles from Dr Idell Pickles office about lovenox bridging for Mr Spang prior to his lower ext angio.  Wells Guiles communicated that she wanted [place] to bridge the patient and her office will follow up with INRs after the procedure.  I will advise patient of this and our pharmacist, Erasmo Downer.    I spoke with patient and he understands that we will bridge him before the procedure and after the procedure, but he will follow up with Dr Idell Pickles office after the procedure for the INR checks.

## 2013-11-05 NOTE — Telephone Encounter (Signed)
Robert Stanton at [facility] called in regards to patient and up coming procedure of angiogram.  Robert Stanton advised patient will need to be bridged with Lovanox Rx but will provide patient with instructions and Rx.  Patient will be advised to follow up here in office for PT/INR recheck after procedure.

## 2013-11-06 NOTE — Telephone Encounter (Signed)
Spoke with wife, pt current dose of warfarin 5mg  daily.  Wife will be giving enxoaparin injections.  Advised will mail her a schedule of when to give, if she has questions she can call me for more information.  Butch Penny voiced understanding.

## 2013-11-07 ENCOUNTER — Other Ambulatory Visit: Payer: Self-pay | Admitting: Pharmacist Clinician (PhC)/ Clinical Pharmacy Specialist

## 2013-11-07 MED ORDER — ENOXAPARIN SODIUM 100 MG/ML ~~LOC~~ SOLN: 100.0000 mg | Freq: Two times a day (BID) | SUBCUTANEOUS | Status: DC

## 2013-11-07 NOTE — Telephone Encounter (Signed)
Lovenox rx for upcoming cath 12/02/13.  Directions mailed to pt home.

## 2013-11-14 ENCOUNTER — Telehealth: Payer: Self-pay | Admitting: Pharmacist Clinician (PhC)/ Clinical Pharmacy Specialist

## 2013-11-14 NOTE — Telephone Encounter (Signed)
Pt called, states having angiogram on Feb 16 by Dr. Gwenlyn Found.  Wants to know if he needs to take prophylactic antibiotic beforehand

## 2013-11-15 ENCOUNTER — Encounter: Payer: Self-pay | Admitting: Internal Medicine

## 2013-11-15 ENCOUNTER — Ambulatory Visit (INDEPENDENT_AMBULATORY_CARE_PROVIDER_SITE_OTHER): Payer: Medicare Other | Admitting: Internal Medicine

## 2013-11-15 VITALS — BP 124/68 | HR 80 | Temp 99.0°F | Resp 16 | Wt 232.6 lb

## 2013-11-15 DIAGNOSIS — Z7901 Long term (current) use of anticoagulants: Secondary | ICD-10-CM

## 2013-11-15 DIAGNOSIS — E785 Hyperlipidemia, unspecified: Secondary | ICD-10-CM

## 2013-11-15 DIAGNOSIS — E559 Vitamin D deficiency, unspecified: Secondary | ICD-10-CM

## 2013-11-15 DIAGNOSIS — I10 Essential (primary) hypertension: Secondary | ICD-10-CM

## 2013-11-15 DIAGNOSIS — Z79899 Other long term (current) drug therapy: Secondary | ICD-10-CM

## 2013-11-15 DIAGNOSIS — R7303 Prediabetes: Secondary | ICD-10-CM

## 2013-11-15 DIAGNOSIS — R7309 Other abnormal glucose: Secondary | ICD-10-CM

## 2013-11-15 DIAGNOSIS — E782 Mixed hyperlipidemia: Secondary | ICD-10-CM

## 2013-11-15 LAB — CBC WITH DIFFERENTIAL/PLATELET
BASOS PCT: 0 % (ref 0–1)
Basophils Absolute: 0 10*3/uL (ref 0.0–0.1)
Eosinophils Absolute: 0.2 10*3/uL (ref 0.0–0.7)
Eosinophils Relative: 3 % (ref 0–5)
HEMATOCRIT: 37.2 % — AB (ref 39.0–52.0)
HEMOGLOBIN: 12.2 g/dL — AB (ref 13.0–17.0)
LYMPHS ABS: 1.7 10*3/uL (ref 0.7–4.0)
LYMPHS PCT: 25 % (ref 12–46)
MCH: 30.5 pg (ref 26.0–34.0)
MCHC: 32.8 g/dL (ref 30.0–36.0)
MCV: 93 fL (ref 78.0–100.0)
MONOS PCT: 12 % (ref 3–12)
Monocytes Absolute: 0.8 10*3/uL (ref 0.1–1.0)
NEUTROS ABS: 3.9 10*3/uL (ref 1.7–7.7)
NEUTROS PCT: 60 % (ref 43–77)
Platelets: 192 10*3/uL (ref 150–400)
RBC: 4 MIL/uL — AB (ref 4.22–5.81)
RDW: 14.8 % (ref 11.5–15.5)
WBC: 6.5 10*3/uL (ref 4.0–10.5)

## 2013-11-15 LAB — BASIC METABOLIC PANEL WITH GFR
BUN: 23 mg/dL (ref 6–23)
CHLORIDE: 104 meq/L (ref 96–112)
CO2: 30 mEq/L (ref 19–32)
Calcium: 8.7 mg/dL (ref 8.4–10.5)
Creat: 1.51 mg/dL — ABNORMAL HIGH (ref 0.50–1.35)
GFR, EST AFRICAN AMERICAN: 53 mL/min — AB
GFR, Est Non African American: 46 mL/min — ABNORMAL LOW
GLUCOSE: 118 mg/dL — AB (ref 70–99)
POTASSIUM: 3.7 meq/L (ref 3.5–5.3)
SODIUM: 139 meq/L (ref 135–145)

## 2013-11-15 LAB — HEPATIC FUNCTION PANEL
ALK PHOS: 57 U/L (ref 39–117)
ALT: 13 U/L (ref 0–53)
AST: 17 U/L (ref 0–37)
Albumin: 3.9 g/dL (ref 3.5–5.2)
BILIRUBIN DIRECT: 0.1 mg/dL (ref 0.0–0.3)
BILIRUBIN INDIRECT: 0.4 mg/dL (ref 0.2–1.2)
Total Bilirubin: 0.5 mg/dL (ref 0.2–1.2)
Total Protein: 6.7 g/dL (ref 6.0–8.3)

## 2013-11-15 LAB — PROTIME-INR
INR: 2.1 — AB (ref <1.50)
PROTHROMBIN TIME: 23.1 s — AB (ref 11.6–15.2)

## 2013-11-15 LAB — LIPID PANEL
CHOL/HDL RATIO: 4.6 ratio
CHOLESTEROL: 162 mg/dL (ref 0–200)
HDL: 35 mg/dL — ABNORMAL LOW (ref >39)
LDL Cholesterol: 93 mg/dL (ref 0–99)
TRIGLYCERIDES: 171 mg/dL — AB (ref <150)
VLDL: 34 mg/dL (ref 0–40)

## 2013-11-15 LAB — HEMOGLOBIN A1C
HEMOGLOBIN A1C: 6 % — AB (ref <5.7)
Mean Plasma Glucose: 126 mg/dL — ABNORMAL HIGH (ref <117)

## 2013-11-15 LAB — MAGNESIUM: Magnesium: 1.5 mg/dL (ref 1.5–2.5)

## 2013-11-15 LAB — TSH: TSH: 2.818 u[IU]/mL (ref 0.350–4.500)

## 2013-11-15 MED ORDER — FEBUXOSTAT 40 MG PO TABS: 40.0000 mg | ORAL_TABLET | Freq: Every day | ORAL | Status: DC

## 2013-11-15 NOTE — Telephone Encounter (Signed)
I spoke with Kerin Ransom [facility] to verify that we do not give abx prior to a cath.    Robert Stanton for patient.

## 2013-11-15 NOTE — Patient Instructions (Signed)
Warfarin: What You Need to Know Warfarin is an anticoagulant. Anticoagulants help prevent the formation of blood clots. They also help stop the growth of blood clots. Warfarin is sometimes referred to as a "blood thinner."  Normally, when body tissues are cut or damaged, the blood clots in order to prevent blood loss. Sometimes clots form inside your blood vessels and obstruct the flow of blood through your circulatory system (thrombosis). These clots may travel through your bloodstream and become lodged in smaller blood vessels in your brain, which can cause a stroke, or your lungs (pulmonary embolism). WHO SHOULD USE WARFARIN? Warfarin is prescribed for people at risk of developing harmful blood clots:  People with surgically implanted mechanical heart valves, irregular heart rhythms called atrial fibrillation, and certain clotting disorders.  People who have developed harmful blood clotting in the past, including those who have had a stroke or a pulmonary embolism, or thrombosis in their legs (deep vein thrombosis [DVT]).  People with an existing blood clot such as a pulmonary embolism. WARFARIN DOSING Warfarin tablets come in different strengths. Each tablet strength is a different color, with the amount of warfarin (in milligrams) clearly printed on the tablet. If the color of your tablet is different than usual when you receive a new prescription, report it immediately to your pharmacist or health care provider. WARFARIN MONITORING The goal of warfarin therapy is to lessen the clotting tendency of blood but not to prevent clotting completely. Your health care provider will monitor the anticoagulation effect of warfarin closely and adjust your dose as needed. For your safety, blood tests called prothrombin time (PT) or international normalized ratio (INR) are used to measure the effects of warfarin. Both of these tests can be done with a finger stick or a blood draw. The longer it takes the blood  to clot, the higher the PT or INR. Your health care provider will inform you of your "target" PT or INR range. If, at any time, your PT or INR is above the target range, there is a risk of bleeding. If your PT or INR is below the target range, there is a risk of clotting. Whether you are started on warfarin while you are in the hospital, or in your health care provider's office, you will need to have your PT or INR checked within one week of starting the medicine. Initially, some people are asked to have their PT or INR checked as much as twice a week. Once you are on a stable maintenance dose, the PT or INR is checked less often, usually once every 2 to 4 weeks. The warfarin dose may be adjusted if the PT or INR is not within the target range. It is important to keep all laboratory and health care provider follow-up appointments.  WHAT ARE THE SIDE EFFECTS OF WARFARIN?  Too much warfarin can cause bleeding (hemorrhage) from any part of the body. This may include bleeding from the gums, blood in the urine, bloody or dark stools, a nosebleed that is not easily stopped, coughing up blood, or vomiting blood.  Too little warfarin can increase the risk of blood clots.  Too little or too much warfarin can also increase the risk of a stroke.  Warfarin use may cause a skin rash or irritation, an unusual fever, continual nausea or stomach upset, or severe pain in your joints or back. SPECIAL PRECAUTIONS WHILE TAKING WARFARIN Warfarin should be taken exactly as directed:  Take your medicine at the same time every day.   If you forget to take your dose, you can take it if it is within 6 hours of when it was due.  Do not change the dose of warfarin on your own to make up for missed or extra doses.  If you miss more than 2 doses in a row, you should contact your health care provider for advice. Avoid situations that cause bleeding. You may have a tendency to bleed more easily than usual while taking warfarin.  The following actions can limit bleeding:  Using a softer toothbrush.  Flossing with waxed floss rather than unwaxed floss.  Shaving with an electric razor rather than a blade.  Limiting the use of sharp objects.  Avoiding potentially harmful activities such as contact sports. Warfarin and Pregnancy or Breastfeeding  Warfarin is not advised during the first trimester of pregnancy due to an increased risk of birth defects. In certain situations, a woman may take warfarin after her first trimester of pregnancy. A woman who becomes pregnant or plans to become pregnant while taking warfarin should notify her health care provider immediately.  Although warfarin does not pass into breast milk, a woman who wishes to breastfeed while taking warfarin should also consult with her health care provider. Alcohol, Smoking, and Illicit Drug Use  Alcohol affects how warfarin works in the body. It is best to avoid alcoholic drinks or consume very small amounts while taking warfarin. In general, alcohol intake should be limited to 1 oz (30 mL) of liquor, 6 oz (180 mL) of wine, or 12 oz (360 mL) of beer each day. Notify your health care provider if you change your alcohol intake.  Smoking affects how warfarin works. It is best to avoid smoking while taking warfarin. Notify your health care provider if you change your smoking habits.  It is best to avoid all illicit drugs while taking warfarin since there are few studies that show how warfarin interacts with these drugs. Other Medicines and Dietary Supplements Many prescription and over-the-counter medicines can interfere with warfarin. Be sure all of your health care providers know you are taking warfarin. Notify your health care provider who prescribed warfarin for you before starting or stopping any new medicines, including over-the-counter vitamins, dietary supplements, and pain medicines. Your warfarin dose may need to be adjusted. Some common  over-the-counter medicines that may increase the risk of bleeding while taking warfarin include:   Acetaminophen.  Aspirin.  Nonsteroidal anti-inflammatory medicines such as ibuprofen or naproxen.  Vitamin E. Dietary Considerations  Foods that have moderate or high amounts of vitamin K can interfere with warfarin. Avoid major changes in your diet or notify your health care provider before changing your diet. Eat a consistent amount of foods that have moderate or high amounts of vitamin K.Eating less foods containing vitamin K can increase the risk of bleeding. Eating more foods containing vitamin K can increase the risk of blood clots. Additional questions about dietary considerations can be discussed with a dietitian. The serving size for foods containing moderate or high amounts of vitamin K are  cup cooked (120 mL or noted gram weight) or 1 cup raw (240 mL or noted gram weight), unless otherwise noted. These foods include: Proteins  Beef liver, 3.5 oz (100 g).  Pork liver, 3.5 oz (100 g). Legumes  Soybean oil.  Soybeans.  Garbanzo beans.  Green peas.  Black-eyed peas. Leafy green vegetables  Kale.  Spinach.  Nettle greens.  Swiss chard.  Watercress.  Endive.  Parsley, 1 tbsp (4 g).    Turnip greens.  Collard greens.  Seaweed, limit 2 sheets.  Beet greens.  Dandelion greens.  Mustard greens.  Green Lead and Romaine lettuce. Cruciferous vegetables  Broccoli.  Cabbage (green or Chinese).  Brussels sprouts.  Cauliflower.  Asparagus. Miscellaneous  Onions, green onions, or spring onions.  Green tea made with  oz (14 g) or more of dried tea.  Herbal teas containing coumarin.  Spinach noodles.  Okra.  Prunes.  Pickles. CALL YOUR CLINIC OR HEALTH CARE PROVIDER IF YOU:  Plan to have any surgery or procedure.  Feel sick, especially if you have diarrhea or vomiting.  Experience or anticipate any major changes in your diet.  Start or  stop a prescription or over-the-counter medicine.  Become, plan to become, or think you may be pregnant.  Are having heavier than usual menstrual periods.  Have had a fall, accident, or any symptoms of bleeding or unusual bruising.  An unusual fever. CALL 911 IN THE [place] OR GO TO THE EMERGENCY DEPARTMENT IF YOU:   Think you may be having an allergic reaction to warfarin. The signs of an allergic reaction could include itching, rash, hives, swelling, chest tightness, or trouble breathing.  See signs of blood in your urine. The signs could include reddish, pinkish, or tea-colored urine.  See signs of blood in your stools. The signs could include bright red or black stools.  Vomit or cough up blood. In these instances, the blood could have either a bright red or a "coffee-grounds" appearance.  Have bleeding that will not stop after applying pressure for 30 minutes such as cuts, nosebleeds, other injuries.  Have severe pain in your joints or back.  Have a new and severe headache.  Have sudden weakness or numbness of your face, arm, or leg, especially on one side of your body.  Have sudden confusion or trouble understanding.  Have sudden trouble seeing in one or both eyes.  Have sudden trouble walking, dizziness, loss of balance, or coordination.  Have aphasia. Document Released: 10/04/2005 Document Revised: 06/28/2012 Document Reviewed: 03/30/2013 [employer] Patient Information 2014 [employer]   Hypertension As your heart beats, it forces blood through your arteries. This force is your blood pressure. If the pressure is too high, it is called hypertension (HTN) or high blood pressure. HTN is dangerous because you may have it and not know it. High blood pressure may mean that your heart has to work harder to pump blood. Your arteries may be narrow or stiff. The extra work puts you at risk for heart disease, stroke, and other problems.  Blood pressure consists of two numbers, a  higher number over a lower, 110/72, for example. It is stated as "110 over 72." The ideal is below 120 for the top number (systolic) and under 80 for the bottom (diastolic). Write down your blood pressure today. You should pay close attention to your blood pressure if you have certain conditions such as:  Heart failure.  Prior heart attack.  Diabetes  Chronic kidney disease.  Prior stroke.  Multiple risk factors for heart disease. To see if you have HTN, your blood pressure should be measured while you are seated with your arm held at the level of the heart. It should be measured at least twice. A one-time elevated blood pressure reading (especially in the Emergency Department) does not mean that you need treatment. There may be conditions in which the blood pressure is different between your right and left arms. It is important to see your   caregiver soon for a recheck. Most people have essential hypertension which means that there is not a specific cause. This type of high blood pressure may be lowered by changing lifestyle factors such as:  Stress.  Smoking.  Lack of exercise.  Excessive weight.  Drug/tobacco/alcohol use.  Eating less salt. Most people do not have symptoms from high blood pressure until it has caused damage to the body. Effective treatment can often prevent, delay or reduce that damage. TREATMENT  When a cause has been identified, treatment for high blood pressure is directed at the cause. There are a large number of medications to treat HTN. These fall into several categories, and your caregiver will help you select the medicines that are best for you. Medications may have side effects. You should review side effects with your caregiver. If your blood pressure stays high after you have made lifestyle changes or started on medicines,   Your medication(s) may need to be changed.  Other problems may need to be addressed.  Be certain you understand your  prescriptions, and know how and when to take your medicine.  Be sure to follow up with your caregiver within the time frame advised (usually within two weeks) to have your blood pressure rechecked and to review your medications.  If you are taking more than one medicine to lower your blood pressure, make sure you know how and at what times they should be taken. Taking two medicines at the same time can result in blood pressure that is too low. SEEK IMMEDIATE MEDICAL CARE IF:  You develop a severe headache, blurred or changing vision, or confusion.  You have unusual weakness or numbness, or a faint feeling.  You have severe chest or abdominal pain, vomiting, or breathing problems. MAKE SURE YOU:   Understand these instructions.  Will watch your condition.  Will get help right away if you are not doing well or get worse.   Diabetes and Exercise Exercising regularly is important. It is not just about losing weight. It has many health benefits, such as:  Improving your overall fitness, flexibility, and endurance.  Increasing your bone density.  Helping with weight control.  Decreasing your body fat.  Increasing your muscle strength.  Reducing stress and tension.  Improving your overall health. People with diabetes who exercise gain additional benefits because exercise:  Reduces appetite.  Improves the body's use of blood sugar (glucose).  Helps lower or control blood glucose.  Decreases blood pressure.  Helps control blood lipids (such as cholesterol and triglycerides).  Improves the body's use of the hormone insulin by:  Increasing the body's insulin sensitivity.  Reducing the body's insulin needs.  Decreases the risk for heart disease because exercising:  Lowers cholesterol and triglycerides levels.  Increases the levels of good cholesterol (such as high-density lipoproteins [HDL]) in the body.  Lowers blood glucose levels. YOUR ACTIVITY PLAN  Choose an  activity that you enjoy and set realistic goals. Your health care provider or diabetes educator can help you make an activity plan that works for you. You can break activities into 2 or 3 sessions throughout the day. Doing so is as good as one long session. Exercise ideas include:  Taking the dog for a walk.  Taking the stairs instead of the elevator.  Dancing to your favorite song.  Doing your favorite exercise with a friend. RECOMMENDATIONS FOR EXERCISING WITH TYPE 1 OR TYPE 2 DIABETES   Check your blood glucose before exercising. If blood glucose levels are   greater than 240 mg/dL, check for urine ketones. Do not exercise if ketones are present.  Avoid injecting insulin into areas of the body that are going to be exercised. For example, avoid injecting insulin into:  The arms when playing tennis.  The legs when jogging.  Keep a record of:  Food intake before and after you exercise.  Expected peak times of insulin action.  Blood glucose levels before and after you exercise.  The type and amount of exercise you have done.  Review your records with your health care provider. Your health care provider will help you to develop guidelines for adjusting food intake and insulin amounts before and after exercising.  If you take insulin or oral hypoglycemic agents, watch for signs and symptoms of hypoglycemia. They include:  Dizziness.  Shaking.  Sweating.  Chills.  Confusion.  Drink plenty of water while you exercise to prevent dehydration or heat stroke. Body water is lost during exercise and must be replaced.  Talk to your health care provider before starting an exercise program to make sure it is safe for you. Remember, almost any type of activity is better than none.    Cholesterol Cholesterol is a white, waxy, fat-like protein needed by your body in small amounts. The liver makes all the cholesterol you need. It is carried from the liver by the blood through the blood  vessels. Deposits (plaque) may build up on blood vessel walls. This makes the arteries narrower and stiffer. Plaque increases the risk for heart attack and stroke. You cannot feel your cholesterol level even if it is very high. The only way to know is by a blood test to check your lipid (fats) levels. Once you know your cholesterol levels, you should keep a record of the test results. Work with your caregiver to to keep your levels in the desired range. WHAT THE RESULTS MEAN:  Total cholesterol is a rough measure of all the cholesterol in your blood.  LDL is the so-called bad cholesterol. This is the type that deposits cholesterol in the walls of the arteries. You want this level to be low.  HDL is the good cholesterol because it cleans the arteries and carries the LDL away. You want this level to be high.  Triglycerides are fat that the body can either burn for energy or store. High levels are closely linked to heart disease. DESIRED LEVELS:  Total cholesterol below 200.  LDL below 100 for people at risk, below 70 for very high risk.  HDL above 50 is good, above 60 is best.  Triglycerides below 150. HOW TO LOWER YOUR CHOLESTEROL:  Diet.  Choose fish or white meat chicken and turkey, roasted or baked. Limit fatty cuts of red meat, fried foods, and processed meats, such as sausage and lunch meat.  Eat lots of fresh fruits and vegetables. Choose whole grains, beans, pasta, potatoes and cereals.  Use only small amounts of olive, corn or canola oils. Avoid butter, mayonnaise, shortening or palm kernel oils. Avoid foods with trans-fats.  Use skim/nonfat milk and low-fat/nonfat yogurt and cheeses. Avoid whole milk, cream, ice cream, egg yolks and cheeses. Healthy desserts include angel food cake, ginger snaps, animal crackers, hard candy, popsicles, and low-fat/nonfat frozen yogurt. Avoid pastries, cakes, pies and cookies.  Exercise.  A regular program helps decrease LDL and raises  HDL.  Helps with weight control.  Do things that increase your activity level like gardening, walking, or taking the stairs.  Medication.  May be prescribed   by your caregiver to help lowering cholesterol and the risk for heart disease.  You may need medicine even if your levels are normal if you have several risk factors. HOME CARE INSTRUCTIONS   Follow your diet and exercise programs as suggested by your caregiver.  Take medications as directed.  Have blood work done when your caregiver feels it is necessary. MAKE SURE YOU:   Understand these instructions.  Will watch your condition.  Will get help right away if you are not doing well or get worse.      Vitamin D Deficiency Vitamin D is an important vitamin that your body needs. Having too little of it in your body is called a deficiency. A very bad deficiency can make your bones soft and can cause a condition called rickets.  Vitamin D is important to your body for different reasons, such as:   It helps your body absorb 2 minerals called calcium and phosphorus.  It helps make your bones healthy.  It may prevent some diseases, such as diabetes and multiple sclerosis.  It helps your muscles and heart. You can get vitamin D in several ways. It is a natural part of some foods. The vitamin is also added to some dairy products and cereals. Some people take vitamin D supplements. Also, your body makes vitamin D when you are in the sun. It changes the sun's rays into a form of the vitamin that your body can use. CAUSES   Not eating enough foods that contain vitamin D.  Not getting enough sunlight.  Having certain digestive system diseases that make it hard to absorb vitamin D. These diseases include Crohn's disease, chronic pancreatitis, and cystic fibrosis.  Having a surgery in which part of the stomach or small intestine is removed.  Being obese. Fat cells pull vitamin D out of your blood. That means that obese people  may not have enough vitamin D left in their blood and in other body tissues.  Having chronic kidney or liver disease. RISK FACTORS Risk factors are things that make you more likely to develop a vitamin D deficiency. They include:  Being older.  Not being able to get outside very much.  Living in a nursing home.  Having had broken bones.  Having weak or thin bones (osteoporosis).  Having a disease or condition that changes how your body absorbs vitamin D.  Having dark skin.  Some medicines such as seizure medicines or steroids.  Being overweight or obese. SYMPTOMS Mild cases of vitamin D deficiency may not have any symptoms. If you have a very bad case, symptoms may include:  Bone pain.  Muscle pain.  Falling often.  Broken bones caused by a minor injury, due to osteoporosis. DIAGNOSIS A blood test is the best way to tell if you have a vitamin D deficiency. TREATMENT Vitamin D deficiency can be treated in different ways. Treatment for vitamin D deficiency depends on what is causing it. Options include:  Taking vitamin D supplements.  Taking a calcium supplement. Your caregiver will suggest what dose is best for you. HOME CARE INSTRUCTIONS  Take any supplements that your caregiver prescribes. Follow the directions carefully. Take only the suggested amount.  Have your blood tested 2 months after you start taking supplements.  Eat foods that contain vitamin D. Healthy choices include:  Fortified dairy products, cereals, or juices. Fortified means vitamin D has been added to the food. Check the label on the package to be sure.  Fatty fish like   salmon or trout.  Eggs.  Oysters.  Do not use a tanning bed.  Keep your weight at a healthy level. Lose weight if you need to.  Keep all follow-up appointments. Your caregiver will need to perform blood tests to make sure your vitamin D deficiency is going away. SEEK MEDICAL CARE IF:  You have any questions about your  treatment.  You continue to have symptoms of vitamin D deficiency.  You have nausea or vomiting.  You are constipated.  You feel confused.  You have severe abdominal or back pain. MAKE SURE YOU:  Understand these instructions.  Will watch your condition.  Will get help right away if you are not doing well or get worse.   

## 2013-11-15 NOTE — Progress Notes (Signed)
Patient ID: Robert Stanton, male   DOB: Apr 07, 1941, 73 y.o.   MRN: [id number]   This very nice 73 y.o. MWM presents for 3 month follow up with Hypertension, ASHD/CABG/AoVR,COPD, Hyperlipidemia, Pre-Diabetes and Vitamin D Deficiency.    HTN predates since many years. BP has been controlled at home. Today's BP: 124/68 mmHg. In 2000 he underwent CABG and [place] and has been followed in this office on coumadin since. Patient denies any cardiac type chest pain, palpitations, dyspnea/orthopnea/PND, dizziness, or dependent edema. He is scheduled for lower Extremity Angiograms by Dr Alvester Chou to evaluate Rt sided claudication in the near future and has been instructed in Lovenox bridge.   Hyperlipidemia is controlled with diet & meds. Last Cholesterol was 144, Triglycerides were 115, HDL 39and LDL 82 in Oct 2014 - all at goal. Patient denies myalgias or other med SE's.    Also, the patient has history of PreDiabetes with A1c 6.3% in 10/2008 andwith last A1c of 5.8% in Oct 2014. Patient denies any symptoms of reactive hypoglycemia, diabetic polys, paresthesias or visual blurring. 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   Further, Patient has history of Vitamin D Deficiency of 20 in 2009 with last vitamin D of   . Patient supplements vitamin D without any suspected side-effects.    Medication List         albuterol (2.5 MG/3ML) 0.083% nebulizer solution  Commonly known as:  PROVENTIL  Take 2.5 mg by nebulization every 6 (six) hours as needed for wheezing or shortness of breath.     benzonatate 100 MG capsule  Commonly known as:  TESSALON PERLES  Take 1 capsule (100 mg total) by mouth 3 (three) times daily as needed for cough.     bumetanide 2 MG tablet  Commonly known as:  BUMEX  Take 2 mg by mouth daily.     cetirizine 10 MG tablet  Commonly known as:  ZYRTEC  Take  10 mg by mouth daily.     citalopram 40 MG tablet  Commonly known as:  CELEXA  Take 40 mg by mouth daily.     clonazePAM 1 MG tablet  Commonly known as:  KLONOPIN  Take 1 mg by mouth 3 (three) times daily as needed.     diltiazem 120 MG 24 hr capsule  Commonly known as:  TIAZAC  TAKE 1 TABLET BY MOUTH EVERY DAY     enoxaparin 100 MG/ML injection  Commonly known as:  LOVENOX  Inject 1 mL (100 mg total) into the skin every 12 (twelve) hours.     febuxostat 40 MG tablet  Commonly known as:  ULORIC  Take 1 tablet (40 mg total) by mouth daily. For Gout     fenofibrate micronized 134 MG capsule  Commonly known as:  LOFIBRA  Take 134 mg by mouth daily before breakfast.     FERROUS SULFATE CR PO  Take by mouth daily.     ipratropium-albuterol 0.5-2.5 (3) MG/3ML Soln  Commonly known as:  DUONEB  3 mLs every 6 (six) hours as needed.     isosorbide mononitrate 30 MG 24 hr tablet  Commonly known as:  IMDUR  Take 30 mg by mouth daily.     levothyroxine 50 MCG tablet  Commonly known as:  SYNTHROID, LEVOTHROID  Take 50 mcg by mouth daily before breakfast.     nitroGLYCERIN  0.4 MG SL tablet  Commonly known as:  NITROSTAT  Place 0.4 mg under the tongue every 5 (five) minutes as needed for chest pain.     pravastatin 40 MG tablet  Commonly known as:  PRAVACHOL  Take 40 mg by mouth daily.     tiotropium 18 MCG inhalation capsule  Commonly known as:  SPIRIVA  Place 18 mcg into inhaler and inhale daily.     VITAMIN B 12 PO  Take 1,500 mg by mouth.     VITAMIN D PO  Take 5,000 Units by mouth 2 (two) times daily.     warfarin 5 MG tablet  Commonly known as:  COUMADIN  Take 1 tablet (5 mg total) by mouth daily.         Allergies  Allergen Reactions  . Chantix [Varenicline] Nausea And Vomiting  . Lyrica [Pregabalin]     Swelling, couldn't breathe  . Nsaids   . Simvastatin     Pt said he had a "bleed out" after taking it  . Ziac [Bisoprolol-Hydrochlorothiazide]      fatigue    PMHx:   Past Medical History  Diagnosis Date  . History of artificial heart valve   . Cataracts, bilateral   . OSA (obstructive sleep apnea)   . Atrial fibrillation and flutter   . Ischemic cardiomyopathy   . History of echocardiogram 04/17/2013    EF 40-45%; LV hypertrophy; LV mild-mod dilated; AV regurg.; LA severely diated, RA mildly dilated  . History of Doppler ultrasound 04/05/2013    LEAs; small distal abd. aoritc aneuysm is stable; fem-pop graft to R leg has 50-69% blockage  . History of nuclear stress test 05/14/2011    Dipyridamole; moderate perfusion defect in Basal Infrioer & Mid Inferior region - consistent w/infarct or scar; global LV systolic function mildly reduced; EKG negative for ischemia; low risk scan  . COPD (chronic obstructive pulmonary disease)   . Hyperlipidemia   . Hypertension   . Kidney cysts   . CKD (chronic kidney disease)   . Hiatal hernia   . GERD (gastroesophageal reflux disease)   . Prediabetes   . Arteriosclerotic heart disease (ASHD)   . Gout   . OSA (obstructive sleep apnea)   . AAA (abdominal aortic aneurysm)   . Vitamin B deficiency   . Peripheral arterial disease     FHx:    Reviewed / unchanged  SHx:    Reviewed / unchanged  Systems Review: Constitutional: Denies fever, chills, wt changes, headaches, insomnia, fatigue, night sweats, change in appetite. Eyes: Denies redness, blurred vision, diplopia, discharge, itchy, watery eyes.  ENT: Denies discharge, congestion, post nasal drip, epistaxis, sore throat, earache, hearing loss, dental pain, tinnitus, vertigo, sinus pain, snoring.  CV: Denies chest pain, palpitations, irregular heartbeat, syncope, dyspnea, diaphoresis, orthopnea, PND, claudication, edema. Respiratory: denies cough, dyspnea, DOE, pleurisy, hoarseness, laryngitis, wheezing.  Gastrointestinal: Denies dysphagia, odynophagia, heartburn, reflux, water brash, abdominal pain or cramps, nausea, vomiting, bloating,  diarrhea, constipation, hematemesis, melena, hematochezia,  or hemorrhoids. Genitourinary: Denies dysuria, frequency, urgency, nocturia, hesitancy, discharge, hematuria, flank pain. Musculoskeletal: Denies arthralgias, myalgias, stiffness, jt. swelling, pain, limp, strain/sprain.  Skin: Denies pruritus, rash, hives, warts, acne, eczema, change in skin lesion(s). Neuro: No weakness, tremor, incoordination, spasms, paresthesia, or pain. Psychiatric: Denies confusion, memory loss, or sensory loss. Endo: Denies change in weight, skin, hair change.  Heme/Lymph: No excessive bleeding, bruising, orenlarged lymph nodes.  BP: 124/68  Pulse: 80  Temp: 99 F (37.2 C)  Resp: 16  Estimated body mass index is 35.38 kg/(m^2) as calculated from the following:   Height as of 11/02/13: 5\' 8"  (1.727 m).   Weight as of this encounter: 232 lb 9.6 oz (105.507 kg).  On Exam: Appears well nourished - in no distress. Eyes: PERRLA, EOMs, conjunctiva no swelling or erythema. Sinuses: No frontal/maxillary tenderness ENT/Mouth: EAC's clear, TM's nl w/o erythema, bulging. Nares clear w/o erythema, swelling, exudates. Oropharynx clear without erythema or exudates. Oral hygiene is good. Tongue normal, non obstructing. Hearing intact.  Neck: Supple. Thyroid nl. Car 2+/2+ without bruits, nodes or JVD. Chest: Respirations nl with BS clear & equal w/o rales, rhonchi, wheezing or stridor.  Cor: Prosthetic heart sounds with irregular rate and rhythm without sig. murmurs, gallops, clicks, or rubs. Pedal pulses not palpitated with trace pedal/ankle edema. Abdomen: Soft & bowel sounds normal. Non-tender w/o guarding, rebound, hernias, masses, or organomegaly.  Lymphatics: Unremarkable.  Musculoskeletal: Full ROM all peripheral extremities, joint stability, 5/5 strength, and normal gait.  Skin: Warm, dry without exposed rashes, lesions, ecchymosis apparent.  Neuro: Cranial nerves intact, reflexes equal bilaterally.  Sensory-motor testing grossly intact. Tendon reflexes grossly intact.  Pysch: Alert & oriented x 3. Insight and judgement nl & appropriate. No ideations.  Assessment and Plan:  1. Hypertension/ASHD/CABG/AoVR-St Jude/ASPVD - Continue monitor blood pressure at home. Continue diet/meds same.  2. Hyperlipidemia - Continue diet/meds, exercise,& lifestyle modifications. Continue monitor periodic cholesterol/liver & renal functions   3. Pre-diabetes/Insulin Resistance - Continue diet, exercise, lifestyle modifications. Monitor appropriate labs.  3. Diabetes - continue recommend prudent low glycemic diet, weight control, regular exercise, diabetic monitoring and periodic eye exams.  4. Vitamin D Deficiency - Continue supplementation.  Recommended regular exercise, BP monitoring, weight control, and discussed med and SE's. Recommended labs to assess and monitor clinical status. Further disposition pending results of labs.

## 2013-11-16 LAB — INSULIN, FASTING: Insulin fasting, serum: 27 u[IU]/mL (ref 3–28)

## 2013-11-16 LAB — VITAMIN D 25 HYDROXY (VIT D DEFICIENCY, FRACTURES): Vit D, 25-Hydroxy: 57 ng/mL (ref 30–89)

## 2013-11-19 NOTE — Telephone Encounter (Signed)
Phone busy.

## 2013-11-21 ENCOUNTER — Encounter (HOSPITAL_COMMUNITY): Payer: Self-pay | Admitting: Pharmacy Technician

## 2013-11-21 NOTE — Telephone Encounter (Signed)
Patient's wife aware that an abx is not needed.

## 2013-12-02 ENCOUNTER — Inpatient Hospital Stay (HOSPITAL_COMMUNITY)
Admission: RE | Admit: 2013-12-02 | Discharge: 2013-12-04 | DRG: 300 | Disposition: A | Discharge status: Home / Self Care | Payer: Medicare Other | Source: Ambulatory Visit | Attending: Cardiovascular Disease | Admitting: Cardiovascular Disease

## 2013-12-02 ENCOUNTER — Encounter (HOSPITAL_COMMUNITY): Payer: Self-pay | Admitting: *Deleted

## 2013-12-02 DIAGNOSIS — G4733 Obstructive sleep apnea (adult) (pediatric): Secondary | ICD-10-CM | POA: Diagnosis present

## 2013-12-02 DIAGNOSIS — J449 Chronic obstructive pulmonary disease, unspecified: Secondary | ICD-10-CM | POA: Diagnosis present

## 2013-12-02 DIAGNOSIS — I4589 Other specified conduction disorders: Secondary | ICD-10-CM | POA: Diagnosis present

## 2013-12-02 DIAGNOSIS — Z801 Family history of malignant neoplasm of trachea, bronchus and lung: Secondary | ICD-10-CM

## 2013-12-02 DIAGNOSIS — I129 Hypertensive chronic kidney disease with stage 1 through stage 4 chronic kidney disease, or unspecified chronic kidney disease: Secondary | ICD-10-CM | POA: Diagnosis present

## 2013-12-02 DIAGNOSIS — K449 Diaphragmatic hernia without obstruction or gangrene: Secondary | ICD-10-CM | POA: Diagnosis present

## 2013-12-02 DIAGNOSIS — Z8051 Family history of malignant neoplasm of kidney: Secondary | ICD-10-CM

## 2013-12-02 DIAGNOSIS — Z8249 Family history of ischemic heart disease and other diseases of the circulatory system: Secondary | ICD-10-CM

## 2013-12-02 DIAGNOSIS — L97909 Non-pressure chronic ulcer of unspecified part of unspecified lower leg with unspecified severity: Secondary | ICD-10-CM | POA: Diagnosis present

## 2013-12-02 DIAGNOSIS — N183 Chronic kidney disease, stage 3 unspecified: Secondary | ICD-10-CM | POA: Diagnosis present

## 2013-12-02 DIAGNOSIS — I4821 Permanent atrial fibrillation: Secondary | ICD-10-CM | POA: Diagnosis present

## 2013-12-02 DIAGNOSIS — I739 Peripheral vascular disease, unspecified: Secondary | ICD-10-CM | POA: Diagnosis present

## 2013-12-02 DIAGNOSIS — I708 Atherosclerosis of other arteries: Secondary | ICD-10-CM | POA: Diagnosis present

## 2013-12-02 DIAGNOSIS — F17200 Nicotine dependence, unspecified, uncomplicated: Secondary | ICD-10-CM | POA: Diagnosis present

## 2013-12-02 DIAGNOSIS — I714 Abdominal aortic aneurysm, without rupture, unspecified: Secondary | ICD-10-CM | POA: Diagnosis present

## 2013-12-02 DIAGNOSIS — J4489 Other specified chronic obstructive pulmonary disease: Secondary | ICD-10-CM | POA: Diagnosis present

## 2013-12-02 DIAGNOSIS — I359 Nonrheumatic aortic valve disorder, unspecified: Secondary | ICD-10-CM

## 2013-12-02 DIAGNOSIS — E1122 Type 2 diabetes mellitus with diabetic chronic kidney disease: Secondary | ICD-10-CM | POA: Diagnosis present

## 2013-12-02 DIAGNOSIS — I4949 Other premature depolarization: Secondary | ICD-10-CM | POA: Diagnosis present

## 2013-12-02 DIAGNOSIS — I4891 Unspecified atrial fibrillation: Secondary | ICD-10-CM | POA: Diagnosis present

## 2013-12-02 DIAGNOSIS — M109 Gout, unspecified: Secondary | ICD-10-CM | POA: Diagnosis present

## 2013-12-02 DIAGNOSIS — Z823 Family history of stroke: Secondary | ICD-10-CM

## 2013-12-02 DIAGNOSIS — Z954 Presence of other heart-valve replacement: Secondary | ICD-10-CM

## 2013-12-02 DIAGNOSIS — Z889 Allergy status to unspecified drugs, medicaments and biological substances status: Secondary | ICD-10-CM

## 2013-12-02 DIAGNOSIS — I70219 Atherosclerosis of native arteries of extremities with intermittent claudication, unspecified extremity: Principal | ICD-10-CM | POA: Diagnosis present

## 2013-12-02 DIAGNOSIS — I10 Essential (primary) hypertension: Secondary | ICD-10-CM | POA: Diagnosis present

## 2013-12-02 DIAGNOSIS — I4892 Unspecified atrial flutter: Secondary | ICD-10-CM | POA: Diagnosis present

## 2013-12-02 DIAGNOSIS — N189 Chronic kidney disease, unspecified: Secondary | ICD-10-CM

## 2013-12-02 DIAGNOSIS — I2510 Atherosclerotic heart disease of native coronary artery without angina pectoris: Secondary | ICD-10-CM | POA: Diagnosis present

## 2013-12-02 DIAGNOSIS — I2589 Other forms of chronic ischemic heart disease: Secondary | ICD-10-CM | POA: Diagnosis present

## 2013-12-02 DIAGNOSIS — E785 Hyperlipidemia, unspecified: Secondary | ICD-10-CM | POA: Diagnosis present

## 2013-12-02 DIAGNOSIS — N184 Chronic kidney disease, stage 4 (severe): Secondary | ICD-10-CM

## 2013-12-02 DIAGNOSIS — K219 Gastro-esophageal reflux disease without esophagitis: Secondary | ICD-10-CM | POA: Diagnosis present

## 2013-12-02 DIAGNOSIS — I498 Other specified cardiac arrhythmias: Secondary | ICD-10-CM | POA: Diagnosis present

## 2013-12-02 DIAGNOSIS — I25810 Atherosclerosis of coronary artery bypass graft(s) without angina pectoris: Secondary | ICD-10-CM | POA: Diagnosis present

## 2013-12-02 DIAGNOSIS — I252 Old myocardial infarction: Secondary | ICD-10-CM

## 2013-12-02 HISTORY — DX: Permanent atrial fibrillation: I48.21

## 2013-12-02 HISTORY — DX: Atherosclerotic heart disease of native coronary artery without angina pectoris: I25.10

## 2013-12-02 MED ORDER — ENOXAPARIN SODIUM 100 MG/ML ~~LOC~~ SOLN
100.0000 mg | Freq: Two times a day (BID) | SUBCUTANEOUS | Status: DC
  Filled 2013-12-02 (×3): qty 1

## 2013-12-02 MED ORDER — SODIUM CHLORIDE 0.9 % IJ SOLN: 3.0000 mL | Freq: Two times a day (BID) | INTRAMUSCULAR | Status: DC

## 2013-12-02 MED ORDER — BENZONATATE 100 MG PO CAPS
100.0000 mg | ORAL_CAPSULE | Freq: Three times a day (TID) | ORAL | Status: DC | PRN
  Filled 2013-12-02: qty 1

## 2013-12-02 MED ORDER — CITALOPRAM HYDROBROMIDE 40 MG PO TABS
40.0000 mg | ORAL_TABLET | Freq: Every day | ORAL | Status: DC
  Filled 2013-12-02 (×2): qty 1

## 2013-12-02 MED ORDER — ALBUTEROL SULFATE (2.5 MG/3ML) 0.083% IN NEBU: 2.5000 mg | INHALATION_SOLUTION | Freq: Four times a day (QID) | RESPIRATORY_TRACT | Status: DC | PRN

## 2013-12-02 MED ORDER — ASPIRIN 81 MG PO CHEW
81.0000 mg | CHEWABLE_TABLET | ORAL | Status: AC
  Administered 2013-12-03: 81 mg via ORAL
  Filled 2013-12-02: qty 1

## 2013-12-02 MED ORDER — SODIUM CHLORIDE 0.9 % IV SOLN: 250.0000 mL | INTRAVENOUS | Status: DC | PRN

## 2013-12-02 MED ORDER — ISOSORBIDE MONONITRATE ER 30 MG PO TB24
30.0000 mg | ORAL_TABLET | Freq: Every day | ORAL | Status: DC
  Filled 2013-12-02 (×2): qty 1

## 2013-12-02 MED ORDER — CLONAZEPAM 1 MG PO TABS: 1.0000 mg | ORAL_TABLET | Freq: Three times a day (TID) | ORAL | Status: DC | PRN

## 2013-12-02 MED ORDER — SODIUM CHLORIDE 0.9 % IV SOLN
1.0000 mL/kg/h | INTRAVENOUS | Status: DC
  Administered 2013-12-02 – 2013-12-03 (×2): 1 mL/kg/h via INTRAVENOUS

## 2013-12-02 MED ORDER — TIOTROPIUM BROMIDE MONOHYDRATE 18 MCG IN CAPS
18.0000 ug | ORAL_CAPSULE | Freq: Every day | RESPIRATORY_TRACT | Status: DC
  Filled 2013-12-02: qty 5

## 2013-12-02 MED ORDER — LEVOTHYROXINE SODIUM 50 MCG PO TABS
50.0000 ug | ORAL_TABLET | Freq: Every day | ORAL | Status: DC
  Administered 2013-12-03: 50 ug via ORAL
  Filled 2013-12-02 (×2): qty 1

## 2013-12-02 MED ORDER — FEBUXOSTAT 40 MG PO TABS
40.0000 mg | ORAL_TABLET | Freq: Every day | ORAL | Status: DC
  Filled 2013-12-02 (×2): qty 1

## 2013-12-02 MED ORDER — IPRATROPIUM-ALBUTEROL 0.5-2.5 (3) MG/3ML IN SOLN: 3.0000 mL | Freq: Four times a day (QID) | RESPIRATORY_TRACT | Status: DC | PRN

## 2013-12-02 MED ORDER — SODIUM CHLORIDE 0.9 % IJ SOLN: 3.0000 mL | INTRAMUSCULAR | Status: DC | PRN

## 2013-12-02 NOTE — H&P (Signed)
Primary Care Physician: Alesia Richards, MD Primary Cardiologist:  Dr Darlina Rumpf Robert Stanton is a 73 y.o. male with a h/o ischemic CM (EF 40-45%), CABG in 2000 with a LIMA to the LAD.  Subsequently he has been found to have all vein grafts occluded.  He has had a St. Jude AVR replacement.  He also has sleep apnea, COPD, and persistent  (probably permanent) atrial fibrillation/flutter.  He is nevertheless on chronic amiodarone therapy per Dr Gwenlyn Found.  Dr Kennon Holter last note suggests that he is considering stopping amiodarone due to bradycardia.   In addition, he has difficulties with PVD.  He continues to smoke and is not ready to quit.  He has a small abdominal aortic aneurysm as well as severe peripheral vascular occlusive disease. He has occluded left iliac system status post right-to-left fem-fem crossover graft as well as bilateral femoropopliteal bypass grafts. He has a right ABI 0.64 with what appears to be a significant lesion in his right external iliac artery jeopardizing both lower extremities. He does have lifestyle limiting claudication and a small ulcer on his right foot which has subsequently healed.  He reports that frequently he is limited to the bed because of his severe leg pain.  Today, he denies symptoms of palpitations, chest pain, shortness of breath, orthopnea, PND, lower extremity edema, dizziness, presyncope, syncope, or neurologic sequela. The patient is tolerating medications without difficulties and is otherwise without complaint today.   Past Medical History  Diagnosis Date  . History of artificial heart valve   . Cataracts, bilateral   . OSA (obstructive sleep apnea)   . Permanent atrial fibrillation   . Ischemic cardiomyopathy   . History of echocardiogram 04/17/2013    EF 40-45%; LV hypertrophy; LV mild-mod dilated; AV regurg.; LA severely diated, RA mildly dilated  . History of Doppler ultrasound 04/05/2013    LEAs; small distal abd. aoritc aneuysm is stable;  fem-pop graft to R leg has 50-69% blockage  . History of nuclear stress test 05/14/2011    Dipyridamole; moderate perfusion defect in Basal Infrioer & Mid Inferior region - consistent w/infarct or scar; global LV systolic function mildly reduced; EKG negative for ischemia; low risk scan  . COPD (chronic obstructive pulmonary disease)   . Hyperlipidemia   . Hypertension   . Kidney cysts   . CKD (chronic kidney disease)   . Hiatal hernia   . GERD (gastroesophageal reflux disease)   . Prediabetes   . Arteriosclerotic heart disease (ASHD)   . Gout   . AAA (abdominal aortic aneurysm)   . Vitamin B deficiency   . Peripheral arterial disease   . CAD (coronary artery disease)    Past Surgical History  Procedure Laterality Date  . Stents in kidneys    . Coronary artery bypass graft  2000    3 vessel; LIMA to LAD; RCA, OM  . Shunts in femoral arteries    . Cardioversion  8/282012  . Aortic valve replacement      St. Jude  . Femoral bypass    . Cardiac catheterization  11/25/2008    loss of 2/3 (RCA, OM) bypass grafts with patent internal mammary artery to LAD; large collateral filling of RCA ; osteal narrowing of circumflex of ~50%   Medicines are reviewed  Allergies  Allergen Reactions  . Chantix [Varenicline] Nausea And Vomiting  . Lyrica [Pregabalin]     Swelling, couldn't breathe  . Nsaids   . Simvastatin     Pt said  he had a "bleed out" after taking it  . Ziac [Bisoprolol-Hydrochlorothiazide]     fatigue    History   Social History  . Marital Status: Married    Spouse Name: N/A    Number of Children: 3  . Years of Education: N/A   Occupational History  . Not on file.   Social History Main Topics  . Smoking status: Current Every Day Smoker -- 0.50 packs/day for 40 years    Types: Cigarettes  . Smokeless tobacco: Never Used  . Alcohol Use: No  . Drug Use: No  . Sexual Activity: Not on file   Other Topics Concern  . Not on file   Social History Narrative    Lives in [place] with disabled wife, three sons, and grandson   Retired    Family History  Problem Relation Age of Onset  . Heart attack Father   . Heart attack Mother   . Lung cancer Sister     x 2  . Kidney cancer Sister   . Kidney disease Sister   . Cancer Brother     ?  . Stroke Brother     ROS- All systems are reviewed and negative except as per the HPI above  Physical Exam: Filed Vitals:   12/02/13 1256 12/02/13 1258  BP:  151/69  Pulse:  108  Temp:  98.1 F (36.7 C)  TempSrc:  Oral  Resp:  16  Height: 5\' 9"  (1.753 m)   Weight: 228 lb (103.42 kg)   SpO2:  99%    GEN- The patient is chronically ill appearing, alert and oriented x 3 today.   Head- normocephalic, atraumatic Eyes-  Sclera clear, conjunctiva pink Ears- hearing intact Oropharynx- clear Neck- supple,  No bruit Lungs- Clear to ausculation bilaterally with a prolonged expiratory phase, normal work of breathing Heart- irregular rate and rhythm, no murmurs, rubs or gallops, PMI not laterally displaced  GI- soft, NT, ND, + BS Extremities- no clubbing, cyanosis, or edema,  2+ patellar pulses, diminished DP/PT pulses bilaterally MS- no significant deformity or atrophy Skin- no rash or lesion Psych- euthymic mood, full affect Neuro- strength and sensation are intact  EKG 10/02/14- afib, V rate 59, IVCD Dr Kennon Holter note is reviewed  Assessment and Plan:  1. PVD Pt admitted for IV hydration prior to angiogram tomorrow Risks, benefits, and alternatives to this procedure were discussed with the patient who wishes to proceed. Will hydrate overnight and repeat bmet in am  2. CRI As above  3. Permanent afib Would consider stopping amiodarone Coumadin is on hold and he is receiving a lovenox bridge  4. CAD No ischemic symptoms Continue current medicine regimen  5. HTn Stable No change required today  6. Ischemic CM (EF 45%) We will be cautious with IVF  7. Tobacco Cessation strongly  advised

## 2013-12-03 ENCOUNTER — Encounter (HOSPITAL_COMMUNITY)
Admission: RE | Disposition: A | Discharge status: Home / Self Care | Payer: Self-pay | Source: Ambulatory Visit | Attending: Cardiovascular Disease

## 2013-12-03 DIAGNOSIS — I714 Abdominal aortic aneurysm, without rupture, unspecified: Secondary | ICD-10-CM

## 2013-12-03 DIAGNOSIS — I739 Peripheral vascular disease, unspecified: Secondary | ICD-10-CM

## 2013-12-03 HISTORY — PX: LOWER EXTREMITY ANGIOGRAM: SHX5508

## 2013-12-03 LAB — BASIC METABOLIC PANEL
BUN: 20 mg/dL (ref 6–23)
CHLORIDE: 103 meq/L (ref 96–112)
CO2: 23 mEq/L (ref 19–32)
CREATININE: 1.56 mg/dL — AB (ref 0.50–1.35)
Calcium: 8.9 mg/dL (ref 8.4–10.5)
GFR calc Af Amer: 50 mL/min — ABNORMAL LOW (ref >90)
GFR calc non Af Amer: 43 mL/min — ABNORMAL LOW (ref >90)
Glucose, Bld: 103 mg/dL — ABNORMAL HIGH (ref 70–99)
Potassium: 4.2 mEq/L (ref 3.7–5.3)
Sodium: 140 mEq/L (ref 137–147)

## 2013-12-03 LAB — CBC
HEMATOCRIT: 34.1 % — AB (ref 39.0–52.0)
Hemoglobin: 11.4 g/dL — ABNORMAL LOW (ref 13.0–17.0)
MCH: 31.5 pg (ref 26.0–34.0)
MCHC: 33.4 g/dL (ref 30.0–36.0)
MCV: 94.2 fL (ref 78.0–100.0)
PLATELETS: 172 10*3/uL (ref 150–400)
RBC: 3.62 MIL/uL — ABNORMAL LOW (ref 4.22–5.81)
RDW: 15 % (ref 11.5–15.5)
WBC: 7.8 10*3/uL (ref 4.0–10.5)

## 2013-12-03 LAB — PROTIME-INR
INR: 1.23 (ref 0.00–1.49)
Prothrombin Time: 15.2 seconds (ref 11.6–15.2)

## 2013-12-03 SURGERY — ANGIOGRAM, LOWER EXTREMITY
Anesthesia: LOCAL

## 2013-12-03 MED ORDER — LIDOCAINE HCL (PF) 1 % IJ SOLN
INTRAMUSCULAR | Status: AC
  Filled 2013-12-03: qty 30

## 2013-12-03 MED ORDER — SODIUM CHLORIDE 0.9 % IV SOLN
INTRAVENOUS | Status: AC
  Administered 2013-12-03: 11:00:00 via INTRAVENOUS

## 2013-12-03 MED ORDER — NITROGLYCERIN 0.2 MG/ML ON CALL CATH LAB
INTRAVENOUS | Status: AC
  Filled 2013-12-03: qty 1

## 2013-12-03 MED ORDER — WARFARIN - PHARMACIST DOSING INPATIENT
Freq: Every day | Status: DC
  Administered 2013-12-03: 19:00:00

## 2013-12-03 MED ORDER — WARFARIN SODIUM 5 MG PO TABS
5.0000 mg | ORAL_TABLET | Freq: Every day | ORAL | Status: DC
  Administered 2013-12-03: 16:00:00 5 mg via ORAL
  Filled 2013-12-03 (×2): qty 1

## 2013-12-03 MED ORDER — DILTIAZEM HCL ER COATED BEADS 120 MG PO CP24
120.0000 mg | ORAL_CAPSULE | ORAL | Status: DC
  Administered 2013-12-03: 120 mg via ORAL
  Filled 2013-12-03: qty 1

## 2013-12-03 MED ORDER — HEPARIN (PORCINE) IN NACL 2-0.9 UNIT/ML-% IJ SOLN
INTRAMUSCULAR | Status: AC
  Filled 2013-12-03: qty 1000

## 2013-12-03 MED ORDER — DILTIAZEM HCL ER BEADS 120 MG PO CP24: 120.0000 mg | ORAL_CAPSULE | ORAL | Status: DC

## 2013-12-03 MED ORDER — WARFARIN SODIUM 5 MG PO TABS: 5.0000 mg | ORAL_TABLET | Freq: Every day | ORAL | Status: DC

## 2013-12-03 MED ORDER — NITROGLYCERIN 0.4 MG SL SUBL: 0.4000 mg | SUBLINGUAL_TABLET | SUBLINGUAL | Status: DC | PRN

## 2013-12-03 MED ORDER — HYDRALAZINE HCL 20 MG/ML IJ SOLN: 10.0000 mg | INTRAMUSCULAR | Status: DC

## 2013-12-03 MED ORDER — MORPHINE SULFATE 2 MG/ML IJ SOLN: 1.0000 mg | INTRAMUSCULAR | Status: DC | PRN

## 2013-12-03 MED ORDER — FERROUS SULFATE 325 (65 FE) MG PO TABS
325.0000 mg | ORAL_TABLET | Freq: Every day | ORAL | Status: DC
  Administered 2013-12-04: 325 mg via ORAL
  Filled 2013-12-03 (×2): qty 1

## 2013-12-03 MED ORDER — HYDRALAZINE HCL 20 MG/ML IJ SOLN: 10.0000 mg | INTRAMUSCULAR | Status: DC | PRN

## 2013-12-03 MED ORDER — ACETAMINOPHEN 325 MG PO TABS
650.0000 mg | ORAL_TABLET | Freq: Four times a day (QID) | ORAL | Status: DC | PRN
  Administered 2013-12-03: 650 mg via ORAL
  Filled 2013-12-03: qty 2

## 2013-12-03 MED ORDER — LABETALOL HCL 5 MG/ML IV SOLN
5.0000 mg | INTRAVENOUS | Status: DC | PRN
  Administered 2013-12-03 – 2013-12-04 (×2): 5 mg via INTRAVENOUS

## 2013-12-03 MED ORDER — ASPIRIN EC 325 MG PO TBEC
325.0000 mg | DELAYED_RELEASE_TABLET | Freq: Every day | ORAL | Status: DC
  Administered 2013-12-04: 325 mg via ORAL
  Filled 2013-12-03: qty 1

## 2013-12-03 NOTE — Interval H&P Note (Signed)
History and Physical Interval Note:  12/03/2013 7:51 AM  [name]  has presented today for surgery, with the diagnosis of Claudication  The various methods of treatment have been discussed with the patient and family. After consideration of risks, benefits and other options for treatment, the patient has consented to  Procedure(s): LOWER EXTREMITY ANGIOGRAM (N/A) as a surgical intervention .  The patient's history has been reviewed, patient examined, no change in status, stable for surgery.  I have reviewed the patient's chart and labs.  Questions were answered to the patient's satisfaction.     Lorretta Harp

## 2013-12-03 NOTE — CV Procedure (Signed)
Robert Stanton is a 73 y.o. Robert    [id number] LOCATION:  FACILITY: [facility]  PHYSICIAN: Quay Burow, M.D. 11/24/1940   DATE OF PROCEDURE:  12/03/2013  DATE OF DISCHARGE:     PV Angiogram/Intervention    History obtained from chart review.Robert Stanton is a 73 year old gentleman with a history of CABG in 2000 with a LIMA to the LAD. All vein grafts were noted to be occluded, and he had a St. Jude AVR which is notably functioning. EF is about 40-45% in 2012. He also has sleep apnea, COPD, and recurrent atrial fibrillation/flutter. He had cardioversion in 2012 but is back in atrial fibrillation, and I started him on amiodarone with the plan of cardioversion. He at his last visit actually declined cardioversion, however, we have kept him on amiodarone because it has seemed to benefit him as far as reducing multiple PVCs which were previously noted. Today he continues to have sinus bradycardia despite decreasing his Cardizem at the last visit from 180 mg to 120 mg daily. The heart rate remains around 48 with a significant intraventricular conduction delay suggesting underlying conduction disease. He reports a recent gout attack, and was given the lower. He's also had significant weight gain and lower extremity fluid retention. This could be due to worsening heart failure. He continues to be bradycardic, with interventricular conduction delay. I've been decreasing his amiodarone intake is about time that we discontinue it.  He saw Dr. Darene Lamer. 10/03/13 referred him to me for peripheral vascular evaluation. He does have a small abdominal aortic aneurysm as well as severe peripheral vascular occlusive disease. He has occluded left iliac system status post right-to-left fem-fem crossover graft as well as bilateral femoropopliteal bypass grafts. He has a right ABI 0.64 with what appears to be a significant lesion in his right external iliac artery jeopardizing both lower extremities. He does have  lifestyle limiting claudication. A small ulcer on his right foot which has subsequently healed.    PROCEDURE DESCRIPTION:   The patient was brought to the second floor [facility] cath lab in the postabsorptive state. He was not premedicated with . His right groinwas prepped and shaved in usual sterile fashion. Xylocaine 1% was used  for local anesthesia. A 5 French sheath was inserted into the right common femoral artery using standard Seldinger technique. A 5 French pigtail catheter was placed in the distal abdominal aorta. Bilateral iliac angiography and bifemoral runoff was performed. There was some difficulty entering the native iliac which was above the fem-fem anastomoses. This required a KMP catheter and Glidewire. A pullback gradient was performed across the entire iliac system with a 5 French endhole catheter after administration of 200 mcg of intra-arterial nitroglycerin through the SideArm sheath. Visipaque was used for the entirety of the case. Retrograde aortic pressure was monitored during the case. A total of 163 cc of contrast was administered to the patient.   HEMODYNAMICS:    AO SYSTOLIC/AO DIASTOLIC: 409/81   Angiographic Data:   1: Distal abdominal aorta-there was a small distal abdominal aortic aneurysm noted.  2: Left lower extremity-the left external iliac artery was subtotally occluded with a 95% segmental calcified stenosis. The anastomosis of the fem-fem crossover graft was widely patent. The SFA was occluded. The femoropopliteal bypass graft with a patent proximal and distally and there was three-vessel runoff.  3: Right lower extremity-the stent in the right common iliac artery was widely patent. There was a small decrease in pressure throughout the entire iliac system  as I pullback the anvil catheter but no specific lesion was identified. He was a 50% hypodense lesion in the right common femoral artery just above the anastomosis of the fem-fem crossover graft.  The SFA was occluded. The femoropopliteal was patent down to the distal anastomosis. There was an 80% stenosis there are corresponding to duplex evidence of stenosis. There is three-vessel runoff  IMPRESSION:patent fem-fem crossover graft and bilateral femoropopliteal bypass graft with a high-grade lesion just above the distal anastomosis on the right corresponding to the duplex evidence of this. I cannot demonstrate a specific lesion in the external iliac artery which the duplex suggested. The sheath will be removed and pressure will be gently held on the groin to achieve hemostasis. The patient left the Cath Lab in stable condition. He'll be hydrated overnight and discharged home in the morning on Coumadin. Given his recent procedure I do not feel that he should have Lovenox bridging at discharge. I will see him back in the office in 2-3 weeks to discuss potential percutaneous transposition of his right distal femoropopliteal bypass graft.   Lorretta Harp MD, [facility] 12/03/2013 8:58 AM

## 2013-12-03 NOTE — Progress Notes (Signed)
Placed patient on CPAP for the night via auto-mode with minimum pressure set at 4cm and maximum pressures set at 20cm. Oxygen set at 2lpm with Sp02 level 93% at this time.

## 2013-12-03 NOTE — Progress Notes (Signed)
ANTICOAGULATION CONSULT NOTE - Initial Consult  Pharmacy Consult for Coumadin Indication: St. Jude AVR   Allergies  Allergen Reactions  . Chantix [Varenicline] Nausea And Vomiting  . Lyrica [Pregabalin]     Swelling, couldn't breathe  . Nsaids   . Simvastatin     Pt said he had a "bleed out" after taking it  . Ziac [Bisoprolol-Hydrochlorothiazide]     fatigue    Patient Measurements: Height: 5\' 9"  (175.3 cm) Weight: 226 lb 3.1 oz (102.6 kg) IBW/kg (Calculated) : 70.7  Vital Signs: Temp: 100 F (37.8 C) (02/16 1004) Temp src: Oral (02/16 1004) BP: 172/72 mmHg (02/16 1030) Pulse Rate: 72 (02/16 1030)  Labs:  Recent Labs  12/03/13 0425  HGB 11.4*  HCT 34.1*  PLT 172  LABPROT 15.2  INR 1.23  CREATININE 1.56*    Estimated Creatinine Clearance: 52 ml/min (by C-G formula based on Cr of 1.56).   Medical History: Past Medical History  Diagnosis Date  . History of artificial heart valve   . Cataracts, bilateral   . OSA (obstructive sleep apnea)   . Permanent atrial fibrillation   . Ischemic cardiomyopathy   . History of echocardiogram 04/17/2013    EF 40-45%; LV hypertrophy; LV mild-mod dilated; AV regurg.; LA severely diated, RA mildly dilated  . History of Doppler ultrasound 04/05/2013    LEAs; small distal abd. aoritc aneuysm is stable; fem-pop graft to R leg has 50-69% blockage  . History of nuclear stress test 05/14/2011    Dipyridamole; moderate perfusion defect in Basal Infrioer & Mid Inferior region - consistent w/infarct or scar; global LV systolic function mildly reduced; EKG negative for ischemia; low risk scan  . COPD (chronic obstructive pulmonary disease)   . Hyperlipidemia   . Hypertension   . Kidney cysts   . CKD (chronic kidney disease)   . Hiatal hernia   . GERD (gastroesophageal reflux disease)   . Prediabetes   . Arteriosclerotic heart disease (ASHD)   . Gout   . AAA (abdominal aortic aneurysm)   . Vitamin B deficiency   . Peripheral  arterial disease   . CAD (coronary artery disease)     Assessment: 73 year old male to resume home Coumadin for AVR following procedure today. INR = 1.23 Home dose = 5 mg daily  Goal of Therapy:  INR 2-3 Monitor platelets by anticoagulation protocol: Yes   Plan:  1) Coumadin 5 mg po daily at 1800 pm 2) Daily INR  Thank you. Anette Guarneri, PharmD [phone]  12/03/2013,11:14 AM

## 2013-12-04 ENCOUNTER — Inpatient Hospital Stay (HOSPITAL_COMMUNITY): Payer: Medicare Other

## 2013-12-04 ENCOUNTER — Other Ambulatory Visit: Payer: Self-pay | Admitting: Cardiology

## 2013-12-04 ENCOUNTER — Encounter (HOSPITAL_COMMUNITY): Payer: Self-pay | Admitting: Cardiology

## 2013-12-04 DIAGNOSIS — J90 Pleural effusion, not elsewhere classified: Secondary | ICD-10-CM

## 2013-12-04 DIAGNOSIS — E1122 Type 2 diabetes mellitus with diabetic chronic kidney disease: Secondary | ICD-10-CM | POA: Diagnosis present

## 2013-12-04 DIAGNOSIS — R0602 Shortness of breath: Secondary | ICD-10-CM

## 2013-12-04 DIAGNOSIS — I4821 Permanent atrial fibrillation: Secondary | ICD-10-CM | POA: Diagnosis present

## 2013-12-04 DIAGNOSIS — I129 Hypertensive chronic kidney disease with stage 1 through stage 4 chronic kidney disease, or unspecified chronic kidney disease: Secondary | ICD-10-CM

## 2013-12-04 DIAGNOSIS — E876 Hypokalemia: Secondary | ICD-10-CM

## 2013-12-04 DIAGNOSIS — N184 Chronic kidney disease, stage 4 (severe): Secondary | ICD-10-CM

## 2013-12-04 DIAGNOSIS — N183 Chronic kidney disease, stage 3 unspecified: Secondary | ICD-10-CM

## 2013-12-04 LAB — BASIC METABOLIC PANEL
BUN: 21 mg/dL (ref 6–23)
CO2: 23 mEq/L (ref 19–32)
CREATININE: 1.56 mg/dL — AB (ref 0.50–1.35)
Calcium: 8.7 mg/dL (ref 8.4–10.5)
Chloride: 105 mEq/L (ref 96–112)
GFR, EST AFRICAN AMERICAN: 50 mL/min — AB (ref >90)
GFR, EST NON AFRICAN AMERICAN: 43 mL/min — AB (ref >90)
GLUCOSE: 125 mg/dL — AB (ref 70–99)
Potassium: 4.1 mEq/L (ref 3.7–5.3)
Sodium: 142 mEq/L (ref 137–147)

## 2013-12-04 LAB — CBC
HCT: 34.2 % — ABNORMAL LOW (ref 39.0–52.0)
Hemoglobin: 11.4 g/dL — ABNORMAL LOW (ref 13.0–17.0)
MCH: 31.2 pg (ref 26.0–34.0)
MCHC: 33.3 g/dL (ref 30.0–36.0)
MCV: 93.7 fL (ref 78.0–100.0)
Platelets: 158 10*3/uL (ref 150–400)
RBC: 3.65 MIL/uL — ABNORMAL LOW (ref 4.22–5.81)
RDW: 15.2 % (ref 11.5–15.5)
WBC: 8.6 10*3/uL (ref 4.0–10.5)

## 2013-12-04 LAB — PROTIME-INR
INR: 1.22 (ref 0.00–1.49)
PROTHROMBIN TIME: 15.1 s (ref 11.6–15.2)

## 2013-12-04 MED ORDER — POTASSIUM CHLORIDE CRYS ER 20 MEQ PO TBCR: 20.0000 meq | EXTENDED_RELEASE_TABLET | Freq: Once | ORAL | Status: DC

## 2013-12-04 MED ORDER — ALBUTEROL SULFATE (2.5 MG/3ML) 0.083% IN NEBU
2.5000 mg | INHALATION_SOLUTION | Freq: Once | RESPIRATORY_TRACT | Status: AC
  Administered 2013-12-04: 10:00:00 2.5 mg via RESPIRATORY_TRACT
  Filled 2013-12-04: qty 3

## 2013-12-04 MED ORDER — FUROSEMIDE 40 MG PO TABS
40.0000 mg | ORAL_TABLET | Freq: Every day | ORAL | Status: DC
  Filled 2013-12-04: qty 1

## 2013-12-04 MED ORDER — ASPIRIN 325 MG PO TBEC: 325.0000 mg | DELAYED_RELEASE_TABLET | Freq: Every day | ORAL | Status: DC

## 2013-12-04 NOTE — Progress Notes (Signed)
Coumadin per pharmacy  73 year old male to resume home Coumadin for AVR following procedure 12/03/13 INR = 1.22 stable Home dose = 5 mg daily  INR goal 2-3  Hem/Onc: Hgb 11.4 and Plt 158 K on 02/17  Plan:  1) Continue coumadin 5 mg po daily at 1800 pm.  If go home today, rec to recheck INR on Thursday at outpatient to reassess dosing 2) Daily INR

## 2013-12-04 NOTE — Progress Notes (Addendum)
Subjective: Complains of SOB he has had for 2 weeks.   Objective: Vital signs in last 24 hours: Temp:  [98 F (36.7 C)-100 F (37.8 C)] 98.6 F (37 C) (02/17 0611) Pulse Rate:  [50-84] 73 (02/17 0808) Resp:  [16-20] 20 (02/17 0808) BP: (125-235)/(42-85) 173/65 mmHg (02/17 0808) SpO2:  [92 %-96 %] 94 % (02/17 0808) Weight:  [227 lb 4.7 oz (103.1 kg)] 227 lb 4.7 oz (103.1 kg) (02/17 0008) Weight change: -11.3 oz (-0.32 kg) Last BM Date: 12/01/13 Intake/Output from previous day: -790 02/16 0701 - 02/17 0700 In: 760 [P.O.:300; I.V.:460] Out: 1350 [Urine:1350] Intake/Output this shift:    PE: General:Pleasant affect, NAD Skin:Warm and dry, brisk capillary refill HEENT:normocephalic, sclera clear, mucus membranes moist Heart:slow and irreg irreg without murmur, gallup, rub or click Lungs:diminished without rales, rhonchi, + wheezes LOV:FIEP, non tender, + BS, do not palpate liver spleen or masses Ext:no lower ext edema, rt groin cath site without hematoma. Neuro:alert and oriented, MAE, follows commands, + facial symmetry   Lab Results:  Recent Labs  12/03/13 0425 12/04/13 0538  WBC 7.8 8.6  HGB 11.4* 11.4*  HCT 34.1* 34.2*  PLT 172 158   BMET  Recent Labs  12/03/13 0425 12/04/13 0538  NA 140 142  K 4.2 4.1  CL 103 105  CO2 23 23  GLUCOSE 103* 125*  BUN 20 21  CREATININE 1.56* 1.56*  CALCIUM 8.9 8.7   No results found for this basename: TROPONINI, CK, MB,  in the last 72 hours  Lab Results  Component Value Date   CHOL 162 11/15/2013   HDL 35* 11/15/2013   LDLCALC 93 11/15/2013   TRIG 171* 11/15/2013   CHOLHDL 4.6 11/15/2013   Lab Results  Component Value Date   HGBA1C 6.0* 11/15/2013     Lab Results  Component Value Date   TSH 2.818 11/15/2013       Studies/Results: PV angio: patent fem-fem crossover graft and bilateral femoropopliteal bypass graft with a high-grade lesion just above the distal anastomosis on the right corresponding  to the duplex evidence of this. I cannot demonstrate a specific lesion in the external iliac artery which the duplex suggested. The sheath will be removed and pressure will be gently held on the groin to achieve hemostasis. The patient left the Cath Lab in stable condition. He'll be hydrated overnight and discharged home in the morning on Coumadin. Given his recent procedure I do not feel that he should have Lovenox bridging at discharge. I will see him back in the office in 2-3 weeks to discuss potential percutaneous transposition of his right distal femoropopliteal bypass graft.     Medications: I have reviewed the patient's current medications. Scheduled Meds: . aspirin EC  325 mg Oral Daily  . diltiazem  120 mg Oral QODAY  . ferrous sulfate  325 mg Oral Q breakfast  . warfarin  5 mg Oral q1800  . Warfarin - Pharmacist Dosing Inpatient   Does not apply q1800   Continuous Infusions:  PRN Meds:.acetaminophen, labetalol, morphine injection, nitroGLYCERIN  Assessment/Plan: Principal Problem:   CLAUDICATION Active Problems:   HYPERTENSION   CORONARY ARTERY DISEASE   AORTIC VALVE DISORDERS, hx Mechanical AVR St Jude   OSA (obstructive sleep apnea)   Arterial leg ulcer, rt foot   Peripheral arterial disease   Permanent atrial fibrillation  PLAN: discharge home,  Coumadin was resumed last pm.  INR 1.22 today. Recheck INR on Thursday in  office.  Continue cpap. Follow up with Dr. Gwenlyn Found in 2-3 weeks. Complains of SOB  Will add neb this am uses at home.  LOS: 2 days   Time spent with pt. :15 minutes. [facility] R  Nurse Practitioner Certified Pager [phone] or after 5pm and on weekends call [phone] 12/04/2013, 8:39 AM   Patient seen, examined. Available data reviewed. Agree with findings, assessment, and plan as outlined by Cecilie Kicks, NP. Exam reveals stable right groin site without hematoma or ecchymoses. Lungs are clear with prolonged expiratory phase and irregular heart rhythm.  Note he is more dyspneic over the past few weeks. No obvious signs of CHF, but he does have mild LV dysfunction by echo 2014. Pt on bumex at home.  Dr Gwenlyn Found in 2 weeks. Will check a CXR this am.  Sherren Mocha, M.D. 12/04/2013 9:07 AM

## 2013-12-04 NOTE — Discharge Summary (Signed)
Physician Discharge Summary       Patient ID: Robert Stanton MRN: [id number] DOB/AGE: Jan 02, 1941 73 y.o.  Admit date: 12/02/2013 Discharge date: 12/04/2013  Discharge Diagnoses:  Principal Problem:   CLAUDICATION Active Problems:   HYPERTENSION   CORONARY ARTERY DISEASE   AORTIC VALVE DISORDERS, hx Mechanical AVR St Jude   OSA (obstructive sleep apnea)   Arterial leg ulcer, rt foot   Peripheral arterial disease   Permanent atrial fibrillation   CKD (chronic kidney disease) stage 3, GFR 30-59 ml/min   Discharged Condition: good  Procedures: PV angiogram by Dr. Serena Croissant Course: 73 year old gentleman with a history of CABG in 2000 with a LIMA to the LAD. All vein grafts were noted to be occluded, and he had a St. Jude AVR which is notably functioning. EF is about 40-45% in 2012. He also has sleep apnea, COPD, and recurrent atrial fibrillation/flutter. He had cardioversion in 2012 but is back in atrial fibrillation, and I started him on amiodarone with the plan of cardioversion. He at his last visit actually declined cardioversion, however, we have kept him on amiodarone because it has seemed to benefit him as far as reducing multiple PVCs which were previously noted. He continues to have sinus bradycardia despite decreasing his Cardizem at the last visit from 180 mg to 120 mg daily. The heart rate remains around 48 with a significant intraventricular conduction delay suggesting underlying conduction disease. He reports a recent gout attack, and was given the lower. He's also had significant weight gain and lower extremity fluid retention. This could be due to worsening heart failure. He continues to be bradycardic, with interventricular conduction delay. Dr. [name] been decreasing his amiodarone and then stopped.   He saw Dr. [name] who 10/03/13 referred him to Dr. Gwenlyn Found for peripheral vascular evaluation. He does have a small abdominal aortic aneurysm as well as severe  peripheral vascular occlusive disease. He has occluded left iliac system status post right-to-left fem-fem crossover graft as well as bilateral femoropopliteal bypass grafts. He has a right ABI 0.64 with what appears to be a significant lesion in his right external iliac artery jeopardizing both lower extremities. He does have lifestyle limiting claudication. A small ulcer on his right foot which has subsequently healed.  Dr. Gwenlyn Found scheduled for elective PV angiogram.    He underwent procedure without complication.   PV angio:  patent fem-fem crossover graft and bilateral femoropopliteal bypass graft with a high-grade lesion just above the distal anastomosis on the right corresponding to the duplex evidence of this. I cannot demonstrate a specific lesion in the external iliac artery which the duplex suggested. The sheath will be removed and pressure will be gently held on the groin to achieve hemostasis. The patient left the Cath Lab in stable condition. He was hydrated overnight and discharged home today after eval by Dr. Burt Knack on Coumadin. Given his recent procedure we did not feel that he should have Lovenox bridging at discharge. Dr. Gwenlyn Found will see him back in the office in 2-3 weeks to discuss potential percutaneous transposition of his right distal femoropopliteal bypass graft.   Pt did complain of SOB over last 2 weeks, we ordered albuterol neb here which he uses at home.  Will check CXR as well.  Check Bmet as outpt.  Discharged back on bumex.   XRAY:  IMPRESSION: 1. Small to moderate size right pleural effusion with some loculation. If clinically feasible, a chest CT with contrast would be helpful to exclude  an underlying pleural-based mass. 2. Interval mild changes of congestive heart failure superimposed on COPD. 3. Stable cardiomegaly. 4. Stable hiatal hernia.  He did have mild HF though placing back on Bumex should take care of that.  ( he had rec'd fluids pre procedure and diuretic  held in the hospital)  But he also has loculated area of fluid though they question underlying pleural mass.  I discussed with Dr. Burt Knack, will plan for outpt. CT scan of chest as recommended.  I called pt and discussed by phone with the pt as well.    Consults: None  Significant Diagnostic Studies:  BMET    Component Value Date/Time   NA 142 12/04/2013 0538   K 4.1 12/04/2013 0538   CL 105 12/04/2013 0538   CO2 23 12/04/2013 0538   GLUCOSE 125* 12/04/2013 0538   BUN 21 12/04/2013 0538   CREATININE 1.56* 12/04/2013 0538   CREATININE 1.51* 11/15/2013 1207   CALCIUM 8.7 12/04/2013 0538   GFRNONAA 43* 12/04/2013 0538   GFRAA 50* 12/04/2013 0538    CBC    Component Value Date/Time   WBC 8.6 12/04/2013 0538   RBC 3.65* 12/04/2013 0538   HGB 11.4* 12/04/2013 0538   HCT 34.2* 12/04/2013 0538   PLT 158 12/04/2013 0538   MCV 93.7 12/04/2013 0538   MCH 31.2 12/04/2013 0538   MCHC 33.3 12/04/2013 0538   RDW 15.2 12/04/2013 0538   LYMPHSABS 1.7 11/15/2013 1207   MONOABS 0.8 11/15/2013 1207   EOSABS 0.2 11/15/2013 1207   BASOSABS 0.0 11/15/2013 1207       Discharge Exam: Blood pressure 173/65, pulse 73, temperature 98.6 F (37 C), temperature source Axillary, resp. rate 20, height 5\' 9"  (1.753 m), weight 227 lb 4.7 oz (103.1 kg), SpO2 94.00%.   Disposition: 01-Home or Self Care       Future Appointments Provider Department Dept Phone   12/18/2013 9:30 AM Vicie Mutters, PA-C [facility] [facility] [phone]   01/21/2014 9:30 AM Ardis Hughs, PA-C [place] [phone]   02/21/2014 9:45 AM Unk Pinto, MD [facility] [phone]   04/25/2014 3:45 PM Unk Pinto, MD [facility] [phone]       Medication List    STOP taking these medications       enoxaparin 100 MG/ML injection  Commonly known as:  LOVENOX      TAKE these medications        albuterol (2.5 MG/3ML) 0.083% nebulizer solution  Commonly known as:  PROVENTIL  Take 2.5 mg by nebulization every 6 (six) hours as needed for wheezing or shortness of breath.     aspirin 325 MG EC tablet  Take 1 tablet (325 mg total) by mouth daily.     benzonatate 100 MG capsule  Commonly known as:  TESSALON  Take 100 mg by mouth 3 (three) times daily as needed for cough.     bumetanide 2 MG tablet  Commonly known as:  BUMEX  Take 2 mg by mouth daily.     cetirizine 10 MG tablet  Commonly known as:  ZYRTEC  Take 10 mg by mouth daily.     citalopram 40 MG tablet  Commonly known as:  CELEXA  Take 40 mg by mouth daily.     clonazePAM 1 MG tablet  Commonly known as:  KLONOPIN  Take 1 mg by mouth 3 (three) times daily as needed for anxiety.  diltiazem 120 MG 24 hr capsule  Commonly known as:  TIAZAC  Take 120 mg by mouth every other day.     febuxostat 40 MG tablet  Commonly known as:  ULORIC  Take 40 mg by mouth daily. For Gout     fenofibrate micronized 134 MG capsule  Commonly known as:  LOFIBRA  Take 134 mg by mouth daily before breakfast.     FERROUS SULFATE CR PO  Take 1 tablet by mouth daily.     ipratropium-albuterol 0.5-2.5 (3) MG/3ML Soln  Commonly known as:  DUONEB  3 mLs every 6 (six) hours as needed.     isosorbide mononitrate 30 MG 24 hr tablet  Commonly known as:  IMDUR  Take 30 mg by mouth daily.     levothyroxine 50 MCG tablet  Commonly known as:  SYNTHROID, LEVOTHROID  Take 50 mcg by mouth daily before breakfast.     nitroGLYCERIN 0.4 MG SL tablet  Commonly known as:  NITROSTAT  Place 0.4 mg under the tongue every 5 (five) minutes as needed for chest pain.     pravastatin 40 MG tablet  Commonly known as:  PRAVACHOL  Take 40 mg by mouth daily.     tiotropium 18 MCG inhalation capsule  Commonly known as:  SPIRIVA  Place 18 mcg into inhaler and inhale daily.     VITAMIN B 12 PO  Take 1,500 mg by mouth daily.     VITAMIN D PO  Take  5,000 Units by mouth daily.     warfarin 5 MG tablet  Commonly known as:  COUMADIN  Take 5 mg by mouth daily.       Follow-up Information   Follow up with Lorretta Harp, MD. (the office will call with date and time)    Specialty:  Cardiology   Contact information:   [address] [place] [address] [phone]        Discharge Instructions: Call [place] at [phone] if any bleeding, swelling or drainage at cath site.  May shower, no tub baths for 48 hours for groin sticks.  No lifting for 3 days over 8 pounds, no driving for 3 days.  Heart Healthy diet.   Call if no improvement in SOB.    Have lab work done on Thursday.  Bmet on first floor of Dr. Lysbeth Penner office  Signed: Isaiah Serge Nurse Practitioner-Certified [place]: [facility] 12/04/2013, 9:38 AM  Time spent on discharge : >30 minutes.    Agree as outlined. See my progress note this same date. thx  Sherren Mocha 12/05/2013 10:55 AM

## 2013-12-04 NOTE — Discharge Instructions (Signed)
Call [facility] at [phone] if any bleeding, swelling or drainage at cath site.  May shower, no tub baths for 48 hours for groin sticks.  No lifting for 3 days over 8 pounds, no driving for 3 days.  Heart Healthy diet.   Call if no improvement in SOB.    Have lab work done on Thursday.  Bmet on first floor of Dr. Lysbeth Penner office.

## 2013-12-05 ENCOUNTER — Telehealth: Payer: Self-pay | Admitting: Cardiovascular Disease

## 2013-12-05 ENCOUNTER — Other Ambulatory Visit: Payer: Self-pay | Admitting: Emergency Medicine

## 2013-12-05 DIAGNOSIS — Z7901 Long term (current) use of anticoagulants: Secondary | ICD-10-CM

## 2013-12-05 MED ORDER — BUMETANIDE 2 MG PO TABS: 2.0000 mg | ORAL_TABLET | Freq: Two times a day (BID) | ORAL | Status: DC

## 2013-12-05 NOTE — Telephone Encounter (Signed)
Robert Stanton, PharmD notified and advised order PT/INR w/ labs to be drawn on tomorrow.  She will f/u with pt once results received.  Lab ordered.  Returned call.  Left message to call back before 4pm and ask for triage nurse.  PT NEEDS TO BE NOTIFIED THAT LAB ORDERED AND HE CAN HAVE DRAWN IN ADDITION TO OTHER LABS HE IS SUPPOSED TO HAVE DRAWN TOMORROW AT THE [facility] IN OUR [facility].

## 2013-12-05 NOTE — Telephone Encounter (Signed)
Per answering service today:Following up on discharge from the hospital,he has questions about his pro-time.Please call him.

## 2013-12-06 ENCOUNTER — Other Ambulatory Visit: Payer: Self-pay | Admitting: Cardiology

## 2013-12-06 ENCOUNTER — Ambulatory Visit: Payer: Medicare Other | Admitting: Pharmacist Clinician (PhC)/ Clinical Pharmacy Specialist

## 2013-12-06 LAB — BASIC METABOLIC PANEL WITH GFR
BUN: 23 mg/dL (ref 6–23)
CALCIUM: 9.1 mg/dL (ref 8.4–10.5)
CO2: 30 mEq/L (ref 19–32)
Chloride: 102 mEq/L (ref 96–112)
Creat: 1.53 mg/dL — ABNORMAL HIGH (ref 0.50–1.35)
GFR, EST AFRICAN AMERICAN: 52 mL/min — AB
GFR, Est Non African American: 45 mL/min — ABNORMAL LOW
GLUCOSE: 95 mg/dL (ref 70–99)
POTASSIUM: 4.5 meq/L (ref 3.5–5.3)
SODIUM: 140 meq/L (ref 135–145)

## 2013-12-06 LAB — PROTIME-INR
INR: 1.18 (ref <1.50)
Prothrombin Time: 14.9 seconds (ref 11.6–15.2)

## 2013-12-07 ENCOUNTER — Telehealth: Payer: Self-pay | Admitting: Pharmacist Clinician (PhC)/ Clinical Pharmacy Specialist

## 2013-12-07 NOTE — Telephone Encounter (Signed)
Spoke with patient, normally followed by Dr. Melford Aase, we did a lovenox bridge, however pt stated that when he left hospital he was to just resume 1 tablet daily, no added doses.  INR yesterday was 1.18.  Advised pt (AVR) to take 2 tablets today then 1.5 tablets x 4 days then resume 5mg  daily, and he already has appt with Dr. Melford Aase for next week.  Pt voiced understanding.

## 2013-12-07 NOTE — Telephone Encounter (Signed)
Spoke with patient, INR normally followed by Dr. Melford Aase

## 2013-12-07 NOTE — Telephone Encounter (Signed)
Calling for the results of his INR check Please Call    Thanks

## 2013-12-13 ENCOUNTER — Ambulatory Visit: Payer: Self-pay | Admitting: Emergency Medicine

## 2013-12-18 ENCOUNTER — Ambulatory Visit (INDEPENDENT_AMBULATORY_CARE_PROVIDER_SITE_OTHER): Payer: Medicare Other | Admitting: Physician Assistant

## 2013-12-18 ENCOUNTER — Encounter: Payer: Self-pay | Admitting: Physician Assistant

## 2013-12-18 VITALS — BP 128/64 | HR 64 | Temp 98.1°F | Resp 16 | Ht 68.0 in | Wt 229.0 lb

## 2013-12-18 DIAGNOSIS — I10 Essential (primary) hypertension: Secondary | ICD-10-CM

## 2013-12-18 DIAGNOSIS — Z7901 Long term (current) use of anticoagulants: Secondary | ICD-10-CM

## 2013-12-18 DIAGNOSIS — I2510 Atherosclerotic heart disease of native coronary artery without angina pectoris: Secondary | ICD-10-CM

## 2013-12-18 DIAGNOSIS — M7989 Other specified soft tissue disorders: Secondary | ICD-10-CM

## 2013-12-18 DIAGNOSIS — I4891 Unspecified atrial fibrillation: Secondary | ICD-10-CM

## 2013-12-18 LAB — CBC WITH DIFFERENTIAL/PLATELET
BASOS PCT: 1 % (ref 0–1)
Basophils Absolute: 0.1 10*3/uL (ref 0.0–0.1)
EOS ABS: 0.2 10*3/uL (ref 0.0–0.7)
EOS PCT: 2 % (ref 0–5)
HCT: 37.3 % — ABNORMAL LOW (ref 39.0–52.0)
HEMOGLOBIN: 12.4 g/dL — AB (ref 13.0–17.0)
LYMPHS ABS: 1.8 10*3/uL (ref 0.7–4.0)
Lymphocytes Relative: 23 % (ref 12–46)
MCH: 30.3 pg (ref 26.0–34.0)
MCHC: 33.2 g/dL (ref 30.0–36.0)
MCV: 91.2 fL (ref 78.0–100.0)
MONO ABS: 0.9 10*3/uL (ref 0.1–1.0)
MONOS PCT: 11 % (ref 3–12)
Neutro Abs: 5 10*3/uL (ref 1.7–7.7)
Neutrophils Relative %: 63 % (ref 43–77)
Platelets: 315 10*3/uL (ref 150–400)
RBC: 4.09 MIL/uL — AB (ref 4.22–5.81)
RDW: 14.6 % (ref 11.5–15.5)
WBC: 8 10*3/uL (ref 4.0–10.5)

## 2013-12-18 LAB — BASIC METABOLIC PANEL WITH GFR
BUN: 28 mg/dL — AB (ref 6–23)
CALCIUM: 9.1 mg/dL (ref 8.4–10.5)
CO2: 31 meq/L (ref 19–32)
CREATININE: 1.9 mg/dL — AB (ref 0.50–1.35)
Chloride: 101 mEq/L (ref 96–112)
GFR, EST AFRICAN AMERICAN: 40 mL/min — AB
GFR, Est Non African American: 35 mL/min — ABNORMAL LOW
GLUCOSE: 97 mg/dL (ref 70–99)
Potassium: 4.9 mEq/L (ref 3.5–5.3)
Sodium: 139 mEq/L (ref 135–145)

## 2013-12-18 LAB — URIC ACID: URIC ACID, SERUM: 7.8 mg/dL (ref 4.0–7.8)

## 2013-12-18 LAB — HEPATIC FUNCTION PANEL
ALBUMIN: 4.2 g/dL (ref 3.5–5.2)
ALT: 15 U/L (ref 0–53)
AST: 19 U/L (ref 0–37)
Alkaline Phosphatase: 67 U/L (ref 39–117)
Bilirubin, Direct: 0.1 mg/dL (ref 0.0–0.3)
Indirect Bilirubin: 0.4 mg/dL (ref 0.2–1.2)
TOTAL PROTEIN: 7 g/dL (ref 6.0–8.3)
Total Bilirubin: 0.5 mg/dL (ref 0.2–1.2)

## 2013-12-18 LAB — PROTIME-INR
INR: 3.76 — ABNORMAL HIGH (ref <1.50)
PROTHROMBIN TIME: 36 s — AB (ref 11.6–15.2)

## 2013-12-18 NOTE — Progress Notes (Signed)
HPI 73 y.o. male with compliacted cardiovascular and lung history that continues to smoke presents for a coumadin check and follow up from the hospital. He was in the hospital from 02/15-02/17. He had SOB and CXR showed loculated right sided effusion with possible mass, he is suppose to be scheduled for CT of chest as well as CTA of carotids out patient. He also underwent angio PVD and found to have severe disease, he states he is to follow up with Dr. Gwenlyn Found for possible surgery.  Complains of left leg swelling and achilles tendon pain for several weeks.  Coumadin was found to be low in the hospital so on discharge his coumadin was increased to 7.5mg  daily. He normally takes 5mg  daily, complains of increased bruising but no gross blood. With his valve our goal is 2.5-3.5.   Lab Results  Component Value Date   INR 1.18 12/05/2013   INR 1.22 12/04/2013   INR 1.23 12/03/2013    Past Medical History  Diagnosis Date  . History of artificial heart valve   . Cataracts, bilateral   . OSA (obstructive sleep apnea)   . Permanent atrial fibrillation   . Ischemic cardiomyopathy   . History of echocardiogram 04/17/2013    EF 40-45%; LV hypertrophy; LV mild-mod dilated; AV regurg.; LA severely diated, RA mildly dilated  . History of Doppler ultrasound 04/05/2013    LEAs; small distal abd. aoritc aneuysm is stable; fem-pop graft to R leg has 50-69% blockage  . History of nuclear stress test 05/14/2011    Dipyridamole; moderate perfusion defect in Basal Infrioer & Mid Inferior region - consistent w/infarct or scar; global LV systolic function mildly reduced; EKG negative for ischemia; low risk scan  . COPD (chronic obstructive pulmonary disease)   . Hyperlipidemia   . Hypertension   . Kidney cysts   . CKD (chronic kidney disease)   . Hiatal hernia   . GERD (gastroesophageal reflux disease)   . Prediabetes   . Arteriosclerotic heart disease (ASHD)   . Gout   . AAA (abdominal aortic aneurysm)   . Vitamin  B deficiency   . Peripheral arterial disease   . CAD (coronary artery disease)   . CKD (chronic kidney disease) stage 3, GFR 30-59 ml/min 12/04/2013     Allergies  Allergen Reactions  . Chantix [Varenicline] Nausea And Vomiting  . Lyrica [Pregabalin]     Swelling, couldn't breathe  . Nsaids   . Simvastatin     Pt said he had a "bleed out" after taking it  . Ziac [Bisoprolol-Hydrochlorothiazide]     fatigue      Current Outpatient Prescriptions on File Prior to Visit  Medication Sig Dispense Refill  . albuterol (PROVENTIL) (2.5 MG/3ML) 0.083% nebulizer solution Take 2.5 mg by nebulization every 6 (six) hours as needed for wheezing or shortness of breath.      [name] aspirin EC 325 MG EC tablet Take 1 tablet (325 mg total) by mouth daily.  30 tablet  0  . benzonatate (TESSALON) 100 MG capsule Take 100 mg by mouth 3 (three) times daily as needed for cough.      . bumetanide (BUMEX) 2 MG tablet Take 1 tablet (2 mg total) by mouth 2 (two) times daily.  180 tablet  1  . cetirizine (ZYRTEC) 10 MG tablet Take 10 mg by mouth daily.      . Cholecalciferol (VITAMIN D PO) Take 5,000 Units by mouth daily.       . citalopram (CELEXA) 40  MG tablet Take 40 mg by mouth daily.       . clonazePAM (KLONOPIN) 1 MG tablet Take 1 mg by mouth 3 (three) times daily as needed for anxiety.       . Cyanocobalamin (VITAMIN B 12 PO) Take 1,500 mg by mouth daily.       [name] diltiazem (TIAZAC) 120 MG 24 hr capsule Take 120 mg by mouth every other day.      . febuxostat (ULORIC) 40 MG tablet Take 40 mg by mouth daily. For Gout      . fenofibrate micronized (LOFIBRA) 134 MG capsule Take 134 mg by mouth daily before breakfast.       . Ferrous Sulfate Dried (FERROUS SULFATE CR PO) Take 1 tablet by mouth daily.       [name] ipratropium-albuterol (DUONEB) 0.5-2.5 (3) MG/3ML SOLN 3 mLs every 6 (six) hours as needed.       . isosorbide mononitrate (IMDUR) 30 MG 24 hr tablet Take 30 mg by mouth daily.       [name] levothyroxine (SYNTHROID,  LEVOTHROID) 50 MCG tablet Take 50 mcg by mouth daily before breakfast.      . nitroGLYCERIN (NITROSTAT) 0.4 MG SL tablet Place 0.4 mg under the tongue every 5 (five) minutes as needed for chest pain.      . pravastatin (PRAVACHOL) 40 MG tablet Take 40 mg by mouth daily.      [name] tiotropium (SPIRIVA) 18 MCG inhalation capsule Place 18 mcg into inhaler and inhale daily.      [name] warfarin (COUMADIN) 5 MG tablet Take 5 mg by mouth daily.       No current facility-administered medications on file prior to visit.    ROS: all negative expect above.   Physical: Filed Vitals:   12/18/13 0943  BP: 128/64  Pulse: 64  Temp: 98.1 F (36.7 C)  Resp: 16   Wt Readings from Last 3 Encounters:  12/18/13 229 lb (103.874 kg)  12/04/13 227 lb 4.7 oz (103.1 kg)  12/04/13 227 lb 4.7 oz (103.1 kg)   General Appearance: In no apparent distress. Eyes: PERRLA, EOMs. Sinuses: No Frontal/maxillary tenderness ENT/Mouth: Ext aud canals clear, normal light reflex with TMs without erythema, bulging. Post pharynx without erythema, swelling, exudate.  Respiratory: decreased breath sounds bilaterally, no wheezing/rhonchi.  Cardio: Irreg, irreg with distinct mechanical murmur and holosystolic murmur. Peripheral pulses decreased bilaterally but palpable, decreased hair, no ulcer. Left leg with 2-3 + swelling, achilles tendon tenderness, no erythema or warmth.  Abdomen: Soft, with bowl sounds. Nontender, no guarding, rebound. Lymphatics: Non tender without lymphadenopathy.  Musculoskeletal: Full ROM all peripheral extremities. Skin: Warm, dry without rashes, lesions, ecchymosis.  Neuro: Cranial nerves intact, reflexes equal bilaterally. Normal muscle tone, no cerebellar symptom Pysch: Awake and oriented X 3, normal affect, Poor Insight and Judgment   Assessment and Plan: 1) afib- check coumadin, goal 2.5-3.5, discussed risk of stroke 2) right lower extremity swelling/tender- check CBC, uric acid,and make sure coumadin  at goal, elevate.  3) CHF- weight up 2-3 lbs., continue bumex 2mg  and follow up with heart doctor 4) CRF- check BMP, especially since on Bumex 2 mg 5) COPD with possible lung mass- discussed smoking cessation and how his smoking is a primary cause of all of his comorbidites and that they will continue to deteriorate.   Go to the ER if any CP, SOB, acute leg pain.

## 2013-12-18 NOTE — Patient Instructions (Signed)
Your PT/INR is the test we use to check your coumadin level.  Your goal for your INR is between 2-3.  If you number is below 2 than your blood is thick and you need more coumadin. You are at risk for stroke or clotting during this time period.  If you number is above 3 than your blood is too thin and you need less coumadin or can eat more greens at this time. You are at risk for stomach or head bleeds and need to be careful.   Edema Edema is an abnormal build-up of fluids in tissues. Because this is partly dependent on gravity (water flows to the lowest place), it is more common in the legs and thighs (lower extremities). It is also common in the looser tissues, like around the eyes. Painless swelling of the feet and ankles is common and increases as a person ages. It may affect both legs and may include the calves or even thighs. When squeezed, the fluid may move out of the affected area and may leave a dent for a few moments. CAUSES   Prolonged standing or sitting in one place for extended periods of time. Movement helps pump tissue fluid into the veins, and absence of movement prevents this, resulting in edema.  Varicose veins. The valves in the veins do not work as well as they should. This causes fluid to leak into the tissues.  Fluid and salt overload.  Injury, burn, or surgery to the leg, ankle, or foot, may damage veins and allow fluid to leak out.  Sunburn damages vessels. Leaky vessels allow fluid to go out into the sunburned tissues.  Allergies (from insect bites or stings, medications or chemicals) cause swelling by allowing vessels to become leaky.  Protein in the blood helps keep fluid in your vessels. Low protein, as in malnutrition, allows fluid to leak out.  Hormonal changes, including pregnancy and menstruation, cause fluid retention. This fluid may leak out of vessels and cause edema.  Medications that cause fluid retention. Examples are sex hormones, blood pressure  medications, steroid treatment, or anti-depressants.  Some illnesses cause edema, especially heart failure, kidney disease, or liver disease.  Surgery that cuts veins or lymph nodes, such as surgery done for the heart or for breast cancer, may result in edema. DIAGNOSIS  Your caregiver is usually easily able to determine what is causing your swelling (edema) by simply asking what is wrong (getting a history) and examining you (doing a physical). Sometimes x-rays, EKG (electrocardiogram or heart tracing), and blood work may be done to evaluate for underlying medical illness. TREATMENT  General treatment includes:  Leg elevation (or elevation of the affected body part).  Restriction of fluid intake.  Prevention of fluid overload.  Compression of the affected body part. Compression with elastic bandages or support stockings squeezes the tissues, preventing fluid from entering and forcing it back into the blood vessels.  Diuretics (also called water pills or fluid pills) pull fluid out of your body in the form of increased urination. These are effective in reducing the swelling, but can have side effects and must be used only under your caregiver's supervision. Diuretics are appropriate only for some types of edema. The specific treatment can be directed at any underlying causes discovered. Heart, liver, or kidney disease should be treated appropriately. HOME CARE INSTRUCTIONS   Elevate the legs (or affected body part) above the level of the heart, while lying down.  Avoid sitting or standing still for prolonged periods  of time.  Avoid putting anything directly under the knees when lying down, and do not wear constricting clothing or garters on the upper legs.  Exercising the legs causes the fluid to work back into the veins and lymphatic channels. This may help the swelling go down.  The pressure applied by elastic bandages or support stockings can help reduce ankle swelling.  A low-salt  diet may help reduce fluid retention and decrease the ankle swelling.  Take any medications exactly as prescribed. SEEK MEDICAL CARE IF:  Your edema is not responding to recommended treatments. SEEK IMMEDIATE MEDICAL CARE IF:   You develop shortness of breath or chest pain.  You cannot breathe when you lay down; or if, while lying down, you have to get up and go to the window to get your breath.  You are having increasing swelling without relief from treatment.  You develop a fever over 102 F (38.9 C).  You develop pain or redness in the areas that are swollen.  Tell your caregiver right away if you have gained 03 lb/1.4 kg in 1 day or 05 lb/2.3 kg in a week. MAKE SURE YOU:   Understand these instructions.  Will watch your condition.  Will get help right away if you are not doing well or get worse. Document Released: 10/04/2005 Document Revised: 04/04/2012 Document Reviewed: 05/22/2008 [facility] Patient Information 2014 [place].

## 2013-12-19 ENCOUNTER — Other Ambulatory Visit: Payer: Medicare Other

## 2013-12-19 ENCOUNTER — Telehealth: Payer: Self-pay | Admitting: Cardiology

## 2013-12-19 DIAGNOSIS — J90 Pleural effusion, not elsewhere classified: Secondary | ICD-10-CM

## 2013-12-19 DIAGNOSIS — R0602 Shortness of breath: Secondary | ICD-10-CM

## 2013-12-19 NOTE — Telephone Encounter (Signed)
Called and spoke to Robert Stanton-per Dr Alberteen Spindle to have CT without contrast.  Per Wells Guiles place a new order -CT CHEST without contrast. RN asked Wells Guiles if she would cancel previous order. She states yes.  Order done ,and prior auth(KIM)  Is made aware.

## 2013-12-19 NOTE — Telephone Encounter (Signed)
I spoke with Dr Gwenlyn Found.  Okay to proceed with a CT without contrast due to renal insufficiency.

## 2013-12-19 NOTE — Telephone Encounter (Signed)
Wells Guiles states the Radiologist does not need contrast because the patient creatinine level has increased. Per Wells Guiles , Radiologist states they should be able to see the mass. TEST IS ORDERED FOR TOMORROW  Wells Guiles states a new order has to be placed in [place]. RN will contact the ordering provider Cecilie Kicks NP ),and call [facility]

## 2013-12-19 NOTE — Telephone Encounter (Signed)
Forward to Dr Benay Pike  Spoke to Mickel Baas- she states she is not on the schedule today - refer to Dr Gwenlyn Found Order written - @2 /17/2015

## 2013-12-20 ENCOUNTER — Ambulatory Visit
Admission: RE | Admit: 2013-12-20 | Discharge: 2013-12-20 | Disposition: A | Discharge status: Home / Self Care | Payer: Medicare Other | Source: Ambulatory Visit | Attending: Cardiology | Admitting: Cardiology

## 2013-12-20 DIAGNOSIS — J90 Pleural effusion, not elsewhere classified: Secondary | ICD-10-CM

## 2013-12-20 DIAGNOSIS — R0602 Shortness of breath: Secondary | ICD-10-CM

## 2013-12-21 NOTE — Progress Notes (Signed)
Pt call and informed of his lab results

## 2013-12-26 ENCOUNTER — Ambulatory Visit (INDEPENDENT_AMBULATORY_CARE_PROVIDER_SITE_OTHER): Payer: Medicare Other | Admitting: Cardiology

## 2013-12-26 ENCOUNTER — Encounter: Payer: Self-pay | Admitting: Cardiology

## 2013-12-26 VITALS — BP 120/72 | Ht 69.0 in | Wt 227.0 lb

## 2013-12-26 DIAGNOSIS — J4489 Other specified chronic obstructive pulmonary disease: Secondary | ICD-10-CM

## 2013-12-26 DIAGNOSIS — I10 Essential (primary) hypertension: Secondary | ICD-10-CM

## 2013-12-26 DIAGNOSIS — I4891 Unspecified atrial fibrillation: Secondary | ICD-10-CM

## 2013-12-26 DIAGNOSIS — I4821 Permanent atrial fibrillation: Secondary | ICD-10-CM

## 2013-12-26 DIAGNOSIS — G4733 Obstructive sleep apnea (adult) (pediatric): Secondary | ICD-10-CM

## 2013-12-26 DIAGNOSIS — I739 Peripheral vascular disease, unspecified: Secondary | ICD-10-CM

## 2013-12-26 DIAGNOSIS — N183 Chronic kidney disease, stage 3 unspecified: Secondary | ICD-10-CM

## 2013-12-26 DIAGNOSIS — J449 Chronic obstructive pulmonary disease, unspecified: Secondary | ICD-10-CM

## 2013-12-26 DIAGNOSIS — I359 Nonrheumatic aortic valve disorder, unspecified: Secondary | ICD-10-CM

## 2013-12-26 DIAGNOSIS — I2510 Atherosclerotic heart disease of native coronary artery without angina pectoris: Secondary | ICD-10-CM

## 2013-12-26 DIAGNOSIS — E785 Hyperlipidemia, unspecified: Secondary | ICD-10-CM

## 2013-12-26 NOTE — Assessment & Plan Note (Signed)
CT 12/20/13 ok

## 2013-12-26 NOTE — Patient Instructions (Signed)
Office will call you about PV angiogram

## 2013-12-26 NOTE — Assessment & Plan Note (Signed)
CVR 

## 2013-12-26 NOTE — Assessment & Plan Note (Signed)
S/P PVA  12/02/13. Here to discuss options

## 2013-12-26 NOTE — Assessment & Plan Note (Signed)
Rt calf

## 2013-12-26 NOTE — Assessment & Plan Note (Signed)
No angina 

## 2013-12-26 NOTE — Progress Notes (Signed)
12/26/2013 Robert Stanton   Oct 22, 1940  [id number]  Primary Physicia MCKEOWN,WILLIAM DAVID, MD Primary Cardiologist: Morton Amy  HPI:  Robert Stanton is a 73 year old gentleman with a history of CABG in 2000 with a LIMA to the LAD. All vein grafts were noted to be occluded at cath in 2010. He had a St. Jude AVR in '00 which is functioning. His EF is about 40-45% in 2012. He also has sleep apnea, COPD, and recurrent atrial fibrillation/flutter. He had cardioversion in 2012 but is now in chronic atrial fibrillation and asymptomatic            He was referred to Dr Gwenlyn Found for peripheral vascular evaluation for RLE claudication. He has an occluded left iliac system status post right-to-left fem-fem crossover graft as well as bilateral femoropopliteal bypass grafts. He has a right ABI 0.64. He had a PVA 12/03/13 by Dr Gwenlyn Found and this revealed a distal Rt Fem-Fem graft stenosis. He is in the office today for follow up. He continues to have claudication.   Current Outpatient Prescriptions  Medication Sig Dispense Refill  . albuterol (PROVENTIL) (2.5 MG/3ML) 0.083% nebulizer solution Take 2.5 mg by nebulization every 6 (six) hours as needed for wheezing or shortness of breath.      [name] aspirin EC 325 MG EC tablet Take 1 tablet (325 mg total) by mouth daily.  30 tablet  0  . benzonatate (TESSALON) 100 MG capsule Take 100 mg by mouth 3 (three) times daily as needed for cough.      . bumetanide (BUMEX) 2 MG tablet Take 1 tablet (2 mg total) by mouth 2 (two) times daily.  180 tablet  1  . cetirizine (ZYRTEC) 10 MG tablet Take 10 mg by mouth daily.      . Cholecalciferol (VITAMIN D PO) Take 5,000 Units by mouth daily.       . citalopram (CELEXA) 40 MG tablet Take 40 mg by mouth daily.       . clonazePAM (KLONOPIN) 1 MG tablet Take 1 mg by mouth 3 (three) times daily as needed for anxiety.       . Cyanocobalamin (VITAMIN B 12 PO) Take 1,500 mg by mouth daily.       [name] diltiazem (TIAZAC) 120 MG 24 hr  capsule Take 120 mg by mouth every other day.      . febuxostat (ULORIC) 40 MG tablet Take 40 mg by mouth daily. For Gout      . fenofibrate micronized (LOFIBRA) 134 MG capsule Take 134 mg by mouth daily before breakfast.       . Ferrous Sulfate Dried (FERROUS SULFATE CR PO) Take 1 tablet by mouth daily.       [name] ipratropium-albuterol (DUONEB) 0.5-2.5 (3) MG/3ML SOLN 3 mLs every 6 (six) hours as needed.       . isosorbide mononitrate (IMDUR) 30 MG 24 hr tablet Take 30 mg by mouth daily.       [name] levothyroxine (SYNTHROID, LEVOTHROID) 50 MCG tablet Take 50 mcg by mouth daily before breakfast.      . nitroGLYCERIN (NITROSTAT) 0.4 MG SL tablet Place 0.4 mg under the tongue every 5 (five) minutes as needed for chest pain.      . pravastatin (PRAVACHOL) 40 MG tablet Take 40 mg by mouth daily.      [name] tiotropium (SPIRIVA) 18 MCG inhalation capsule Place 18 mcg into inhaler and inhale daily.      [name] warfarin (COUMADIN) 5 MG tablet Take 5  mg by mouth daily.       No current facility-administered medications for this visit.    Allergies  Allergen Reactions  . Chantix [Varenicline] Nausea And Vomiting  . Lyrica [Pregabalin]     Swelling, couldn't breathe  . Nsaids   . Simvastatin     Pt said he had a "bleed out" after taking it  . Ziac [Bisoprolol-Hydrochlorothiazide]     fatigue    History   Social History  . Marital Status: Married    Spouse Name: N/A    Number of Children: 3  . Years of Education: N/A   Occupational History  . Not on file.   Social History Main Topics  . Smoking status: Current Every Day Smoker -- 0.50 packs/day for 40 years    Types: Cigarettes  . Smokeless tobacco: Never Used  . Alcohol Use: No  . Drug Use: No  . Sexual Activity: Not on file   Other Topics Concern  . Not on file   Social History Narrative   Lives in [place] with disabled wife, three sons, and grandson   Retired   [employer]- remarkable for CAD   Review of Systems: General: negative for  chills, fever, night sweats or weight changes.  Cardiovascular: negative for chest pain, dyspnea on exertion, edema, orthopnea, palpitations, paroxysmal nocturnal dyspnea or shortness of breath Dermatological: negative for rash Respiratory: negative for cough or wheezing Urologic: negative for hematuria Abdominal: negative for nausea, vomiting, diarrhea, bright red blood per rectum, melena, or hematemesis Neurologic: negative for visual changes, syncope, or dizziness All other systems reviewed and are otherwise negative except as noted above.    Blood pressure 120/72, height 5\' 9"  (1.753 m), weight 227 lb (102.967 kg).  General appearance: alert, cooperative, no distress and morbidly obese Neck: no carotid bruit and no JVD Lungs: clear to auscultation bilaterally Heart: regular rate and rhythm and positive valve sounds Abdomen: obese Extremities: no edema or significant ulcers Pulses: 2+ and symmetric diminnished Skin: cool and dry Neurologic: Grossly normal  EKG AF IVCD  ASSESSMENT AND PLAN:   CLAUDICATION Rt calf  PAD (peripheral artery disease),s/p aorto bifem BG, now with need for percutaneous transposition of R distal fem pop S/P PVA  12/02/13. Here to discuss options  Permanent atrial fibrillation CVR  CKD (chronic kidney disease) stage 3, GFR 30-59 ml/min Last SCr 1.9  COPD  CT done 12/20/13 ok CT 12/20/13 ok  HYPERTENSION .  AORTIC VALVE DISORDERS, hx Mechanical AVR St Jude 2000  OSA (obstructive sleep apnea) .  Hyperlipidemia .  CAD- CABG '00, all SVGs occluded 2010, low risk Nuc 2012 No angina    PLAN  Pt seen by Dr Gwenlyn Found and myself. He will need pre authorization for elective admission for PVA and PTA. He will need hydration for renal insufficiency and Lovenox to Coumadin crossover pre procedure.   [facility] 12/26/2013 12:14 PM

## 2013-12-26 NOTE — Assessment & Plan Note (Signed)
2000

## 2013-12-26 NOTE — Assessment & Plan Note (Signed)
Last SCr 1.9 

## 2013-12-28 ENCOUNTER — Telehealth: Payer: Self-pay | Admitting: *Deleted

## 2013-12-28 NOTE — Telephone Encounter (Signed)
When does this need to be done. Robert Stanton said try to do it on a Thursday and not a Monday due to the admitting not being able to get in touch with the patients on Sunday.

## 2013-12-28 NOTE — Telephone Encounter (Signed)
Now that Robert Stanton is back, it shouldn't be a problem to do it any day.

## 2013-12-28 NOTE — Telephone Encounter (Signed)
  Per Dr Gwenlyn Found:  Schedule for a lower extremity angiogram   Hold Bumex for 2 days prior to hospitalization   Admit day before procedure for hydration and will need lovenox bridging.     Patient notified and agreeable

## 2013-12-31 ENCOUNTER — Encounter (HOSPITAL_COMMUNITY): Payer: Self-pay | Admitting: Pharmacy Technician

## 2013-12-31 ENCOUNTER — Encounter: Payer: Self-pay | Admitting: Pharmacist Clinician (PhC)/ Clinical Pharmacy Specialist

## 2013-12-31 ENCOUNTER — Encounter: Payer: Self-pay | Admitting: Cardiovascular Disease

## 2013-12-31 ENCOUNTER — Telehealth: Payer: Self-pay | Admitting: Pharmacist Clinician (PhC)/ Clinical Pharmacy Specialist

## 2013-12-31 MED ORDER — ENOXAPARIN SODIUM 100 MG/ML ~~LOC~~ SOLN: 100.0000 mg | Freq: Two times a day (BID) | SUBCUTANEOUS | Status: DC

## 2013-12-31 NOTE — Telephone Encounter (Signed)
Reviewed lovenox procedure with patient - he did this in January and is familiar with the routine.  Will mail letter to pt with dosing information.  Pt states still has 10 syringes from past.  Will call rx to [facility] in [place] for remaining 6 syringes.

## 2013-12-31 NOTE — Telephone Encounter (Signed)
This has been scheduled

## 2014-01-02 ENCOUNTER — Ambulatory Visit: Payer: Self-pay

## 2014-01-06 ENCOUNTER — Encounter (HOSPITAL_COMMUNITY): Payer: Self-pay | Admitting: Physician Assistant

## 2014-01-06 ENCOUNTER — Observation Stay (HOSPITAL_COMMUNITY): Payer: Medicare Other

## 2014-01-06 ENCOUNTER — Inpatient Hospital Stay (HOSPITAL_COMMUNITY)
Admission: RE | Admit: 2014-01-06 | Discharge: 2014-01-11 | DRG: 254 | Disposition: A | Discharge status: Home / Self Care | Payer: Medicare Other | Source: Ambulatory Visit | Attending: Cardiovascular Disease | Admitting: Cardiovascular Disease

## 2014-01-06 DIAGNOSIS — Z954 Presence of other heart-valve replacement: Secondary | ICD-10-CM

## 2014-01-06 DIAGNOSIS — N183 Chronic kidney disease, stage 3 unspecified: Secondary | ICD-10-CM | POA: Diagnosis present

## 2014-01-06 DIAGNOSIS — Z79899 Other long term (current) drug therapy: Secondary | ICD-10-CM

## 2014-01-06 DIAGNOSIS — Z801 Family history of malignant neoplasm of trachea, bronchus and lung: Secondary | ICD-10-CM

## 2014-01-06 DIAGNOSIS — I739 Peripheral vascular disease, unspecified: Secondary | ICD-10-CM

## 2014-01-06 DIAGNOSIS — Z888 Allergy status to other drugs, medicaments and biological substances status: Secondary | ICD-10-CM

## 2014-01-06 DIAGNOSIS — I714 Abdominal aortic aneurysm, without rupture, unspecified: Secondary | ICD-10-CM | POA: Diagnosis present

## 2014-01-06 DIAGNOSIS — Z7901 Long term (current) use of anticoagulants: Secondary | ICD-10-CM

## 2014-01-06 DIAGNOSIS — F17200 Nicotine dependence, unspecified, uncomplicated: Secondary | ICD-10-CM | POA: Diagnosis present

## 2014-01-06 DIAGNOSIS — K219 Gastro-esophageal reflux disease without esophagitis: Secondary | ICD-10-CM | POA: Diagnosis present

## 2014-01-06 DIAGNOSIS — M109 Gout, unspecified: Secondary | ICD-10-CM | POA: Diagnosis present

## 2014-01-06 DIAGNOSIS — Z886 Allergy status to analgesic agent status: Secondary | ICD-10-CM

## 2014-01-06 DIAGNOSIS — Z841 Family history of disorders of kidney and ureter: Secondary | ICD-10-CM

## 2014-01-06 DIAGNOSIS — Z951 Presence of aortocoronary bypass graft: Secondary | ICD-10-CM

## 2014-01-06 DIAGNOSIS — Z8249 Family history of ischemic heart disease and other diseases of the circulatory system: Secondary | ICD-10-CM

## 2014-01-06 DIAGNOSIS — I4821 Permanent atrial fibrillation: Secondary | ICD-10-CM

## 2014-01-06 DIAGNOSIS — I2510 Atherosclerotic heart disease of native coronary artery without angina pectoris: Secondary | ICD-10-CM | POA: Diagnosis present

## 2014-01-06 DIAGNOSIS — J449 Chronic obstructive pulmonary disease, unspecified: Secondary | ICD-10-CM | POA: Diagnosis present

## 2014-01-06 DIAGNOSIS — Z823 Family history of stroke: Secondary | ICD-10-CM

## 2014-01-06 DIAGNOSIS — Z7982 Long term (current) use of aspirin: Secondary | ICD-10-CM

## 2014-01-06 DIAGNOSIS — I70219 Atherosclerosis of native arteries of extremities with intermittent claudication, unspecified extremity: Secondary | ICD-10-CM | POA: Diagnosis present

## 2014-01-06 DIAGNOSIS — I129 Hypertensive chronic kidney disease with stage 1 through stage 4 chronic kidney disease, or unspecified chronic kidney disease: Secondary | ICD-10-CM | POA: Diagnosis present

## 2014-01-06 DIAGNOSIS — I4891 Unspecified atrial fibrillation: Secondary | ICD-10-CM | POA: Diagnosis present

## 2014-01-06 DIAGNOSIS — I10 Essential (primary) hypertension: Secondary | ICD-10-CM | POA: Diagnosis present

## 2014-01-06 DIAGNOSIS — N184 Chronic kidney disease, stage 4 (severe): Secondary | ICD-10-CM

## 2014-01-06 DIAGNOSIS — E785 Hyperlipidemia, unspecified: Secondary | ICD-10-CM | POA: Diagnosis present

## 2014-01-06 DIAGNOSIS — Z8051 Family history of malignant neoplasm of kidney: Secondary | ICD-10-CM

## 2014-01-06 DIAGNOSIS — J4489 Other specified chronic obstructive pulmonary disease: Secondary | ICD-10-CM | POA: Diagnosis present

## 2014-01-06 DIAGNOSIS — I359 Nonrheumatic aortic valve disorder, unspecified: Secondary | ICD-10-CM

## 2014-01-06 DIAGNOSIS — I70409 Unspecified atherosclerosis of autologous vein bypass graft(s) of the extremities, unspecified extremity: Principal | ICD-10-CM | POA: Diagnosis present

## 2014-01-06 DIAGNOSIS — E1122 Type 2 diabetes mellitus with diabetic chronic kidney disease: Secondary | ICD-10-CM | POA: Diagnosis present

## 2014-01-06 DIAGNOSIS — G4733 Obstructive sleep apnea (adult) (pediatric): Secondary | ICD-10-CM | POA: Diagnosis present

## 2014-01-06 DIAGNOSIS — I2589 Other forms of chronic ischemic heart disease: Secondary | ICD-10-CM | POA: Diagnosis present

## 2014-01-06 HISTORY — DX: Chronic kidney disease, stage 3 (moderate): N18.3

## 2014-01-06 HISTORY — DX: Presence of prosthetic heart valve: Z95.2

## 2014-01-06 HISTORY — DX: Chronic kidney disease, stage 3 unspecified: N18.30

## 2014-01-06 LAB — BASIC METABOLIC PANEL
BUN: 21 mg/dL (ref 6–23)
CO2: 28 mEq/L (ref 19–32)
CREATININE: 1.71 mg/dL — AB (ref 0.50–1.35)
Calcium: 9.2 mg/dL (ref 8.4–10.5)
Chloride: 99 mEq/L (ref 96–112)
GFR calc Af Amer: 45 mL/min — ABNORMAL LOW (ref >90)
GFR, EST NON AFRICAN AMERICAN: 38 mL/min — AB (ref >90)
Glucose, Bld: 135 mg/dL — ABNORMAL HIGH (ref 70–99)
Potassium: 4.7 mEq/L (ref 3.7–5.3)
Sodium: 139 mEq/L (ref 137–147)

## 2014-01-06 LAB — PROTIME-INR
INR: 1.19 (ref 0.00–1.49)
Prothrombin Time: 14.8 seconds (ref 11.6–15.2)

## 2014-01-06 LAB — CBC
HCT: 36.4 % — ABNORMAL LOW (ref 39.0–52.0)
Hemoglobin: 12.4 g/dL — ABNORMAL LOW (ref 13.0–17.0)
MCH: 31.4 pg (ref 26.0–34.0)
MCHC: 34.1 g/dL (ref 30.0–36.0)
MCV: 92.2 fL (ref 78.0–100.0)
Platelets: 218 10*3/uL (ref 150–400)
RBC: 3.95 MIL/uL — ABNORMAL LOW (ref 4.22–5.81)
RDW: 14.5 % (ref 11.5–15.5)
WBC: 6.6 10*3/uL (ref 4.0–10.5)

## 2014-01-06 MED ORDER — ACETAMINOPHEN 325 MG PO TABS
650.0000 mg | ORAL_TABLET | ORAL | Status: DC | PRN
  Filled 2014-01-06: qty 2

## 2014-01-06 MED ORDER — SODIUM CHLORIDE 0.9 % IV SOLN: 250.0000 mL | INTRAVENOUS | Status: DC | PRN

## 2014-01-06 MED ORDER — ALBUTEROL SULFATE (2.5 MG/3ML) 0.083% IN NEBU: 2.5000 mg | INHALATION_SOLUTION | Freq: Four times a day (QID) | RESPIRATORY_TRACT | Status: DC | PRN

## 2014-01-06 MED ORDER — HEPARIN (PORCINE) IN NACL 100-0.45 UNIT/ML-% IJ SOLN
1200.0000 [IU]/h | INTRAMUSCULAR | Status: DC
  Administered 2014-01-06: 1350 [IU]/h via INTRAVENOUS
  Filled 2014-01-06 (×2): qty 250

## 2014-01-06 MED ORDER — FENOFIBRATE 160 MG PO TABS
160.0000 mg | ORAL_TABLET | Freq: Every day | ORAL | Status: DC
  Administered 2014-01-07 – 2014-01-11 (×5): 160 mg via ORAL
  Filled 2014-01-06 (×5): qty 1

## 2014-01-06 MED ORDER — DILTIAZEM HCL ER BEADS 120 MG PO CP24
120.0000 mg | ORAL_CAPSULE | Freq: Every day | ORAL | Status: DC
  Administered 2014-01-07 – 2014-01-11 (×5): 120 mg via ORAL
  Filled 2014-01-06 (×5): qty 1

## 2014-01-06 MED ORDER — LEVOTHYROXINE SODIUM 50 MCG PO TABS
50.0000 ug | ORAL_TABLET | Freq: Every day | ORAL | Status: DC
  Administered 2014-01-07 – 2014-01-11 (×5): 50 ug via ORAL
  Filled 2014-01-06 (×6): qty 1

## 2014-01-06 MED ORDER — VITAMIN D3 25 MCG (1000 UNIT) PO TABS
5000.0000 [IU] | ORAL_TABLET | Freq: Every day | ORAL | Status: DC
  Filled 2014-01-06: qty 5

## 2014-01-06 MED ORDER — VITAMIN B-12 1000 MCG PO TABS
1500.0000 ug | ORAL_TABLET | Freq: Every day | ORAL | Status: DC
  Administered 2014-01-07 – 2014-01-11 (×5): 1500 ug via ORAL
  Filled 2014-01-06 (×6): qty 1

## 2014-01-06 MED ORDER — PRAVASTATIN SODIUM 40 MG PO TABS
40.0000 mg | ORAL_TABLET | Freq: Every day | ORAL | Status: DC
  Administered 2014-01-07 – 2014-01-10 (×4): 40 mg via ORAL
  Filled 2014-01-06 (×5): qty 1

## 2014-01-06 MED ORDER — SODIUM CHLORIDE 0.9 % IV SOLN
INTRAVENOUS | Status: DC
  Administered 2014-01-06: 17:00:00 via INTRAVENOUS

## 2014-01-06 MED ORDER — FEBUXOSTAT 40 MG PO TABS
40.0000 mg | ORAL_TABLET | Freq: Every day | ORAL | Status: DC
  Administered 2014-01-07 – 2014-01-11 (×5): 40 mg via ORAL
  Filled 2014-01-06 (×5): qty 1

## 2014-01-06 MED ORDER — IPRATROPIUM-ALBUTEROL 0.5-2.5 (3) MG/3ML IN SOLN: 3.0000 mL | Freq: Four times a day (QID) | RESPIRATORY_TRACT | Status: DC | PRN

## 2014-01-06 MED ORDER — ASPIRIN EC 325 MG PO TBEC
325.0000 mg | DELAYED_RELEASE_TABLET | Freq: Every day | ORAL | Status: DC
  Filled 2014-01-06 (×2): qty 1

## 2014-01-06 MED ORDER — CLONAZEPAM 1 MG PO TABS: 1.0000 mg | ORAL_TABLET | Freq: Three times a day (TID) | ORAL | Status: DC | PRN

## 2014-01-06 MED ORDER — SODIUM CHLORIDE 0.9 % IJ SOLN: 3.0000 mL | Freq: Two times a day (BID) | INTRAMUSCULAR | Status: DC

## 2014-01-06 MED ORDER — CITALOPRAM HYDROBROMIDE 40 MG PO TABS
40.0000 mg | ORAL_TABLET | Freq: Every day | ORAL | Status: DC
  Administered 2014-01-07 – 2014-01-11 (×5): 40 mg via ORAL
  Filled 2014-01-06 (×5): qty 1

## 2014-01-06 MED ORDER — SODIUM CHLORIDE 0.9 % IJ SOLN: 3.0000 mL | INTRAMUSCULAR | Status: DC | PRN

## 2014-01-06 MED ORDER — BENZONATATE 100 MG PO CAPS
100.0000 mg | ORAL_CAPSULE | Freq: Three times a day (TID) | ORAL | Status: DC | PRN
  Filled 2014-01-06: qty 1

## 2014-01-06 MED ORDER — ISOSORBIDE MONONITRATE ER 30 MG PO TB24
30.0000 mg | ORAL_TABLET | Freq: Every day | ORAL | Status: DC
  Administered 2014-01-07 – 2014-01-11 (×5): 30 mg via ORAL
  Filled 2014-01-06 (×5): qty 1

## 2014-01-06 MED ORDER — FERROUS SULFATE 325 (65 FE) MG PO TABS
325.0000 mg | ORAL_TABLET | Freq: Every day | ORAL | Status: DC
  Administered 2014-01-07 – 2014-01-11 (×5): 325 mg via ORAL
  Filled 2014-01-06 (×6): qty 1

## 2014-01-06 MED ORDER — NON FORMULARY: 40.0000 mg | Freq: Every day | Status: DC

## 2014-01-06 MED ORDER — TIOTROPIUM BROMIDE MONOHYDRATE 18 MCG IN CAPS
18.0000 ug | ORAL_CAPSULE | Freq: Every day | RESPIRATORY_TRACT | Status: DC
  Administered 2014-01-08 – 2014-01-11 (×4): 18 ug via RESPIRATORY_TRACT
  Filled 2014-01-06 (×2): qty 5

## 2014-01-06 MED ORDER — LORATADINE 10 MG PO TABS
10.0000 mg | ORAL_TABLET | Freq: Every day | ORAL | Status: DC
  Administered 2014-01-07 – 2014-01-11 (×5): 10 mg via ORAL
  Filled 2014-01-06 (×5): qty 1

## 2014-01-06 MED ORDER — NITROGLYCERIN 0.4 MG SL SUBL: 0.4000 mg | SUBLINGUAL_TABLET | SUBLINGUAL | Status: DC | PRN

## 2014-01-06 MED ORDER — ASPIRIN EC 325 MG PO TBEC: 325.0000 mg | DELAYED_RELEASE_TABLET | Freq: Every day | ORAL | Status: DC

## 2014-01-06 NOTE — H&P (Signed)
History and Physical  Patient ID: Robert Stanton MRN: [id number], DOB: Mar 30, 1941 Date of Encounter: 01/06/2014, 2:19 PM Primary Physician: Alesia Richards, MD Primary Cardiologist: [name] / [place] = Robert Stanton  Chief Complaint: leg pain Reason for Admission: direct admit for PV angio and intervention tomorrow  HPI: [name] is a 73 year old gentleman with a history of CKD stage III, PAD, CAD (s/p CABG in 2000, all VG occluded by cath 2010, low risk nuc 2012), s/p mechanical St. Jude AVR in 2000, LV dysfunction EF 40-45% in 2014, OSA, COPD, permanent AF and h/o flutter (s/p DCCV in 2012 but now chronic AF). He was recently referred to Dr Robert Stanton for peripheral vascular evaluation for RLE claudication. He has R leg pain/calf tightening upon ambulation that has been limiting his lifestyle. No current rest pain but relays a h/o of problems with foot ulcers. He has an occluded left iliac system status post right-to-left fem-fem crossover graft as well as bilateral femoropopliteal bypass grafts. He has a right ABI 0.64. He had a PVA 12/03/13 by Dr Robert Stanton and this revealed a distal Rt Fem-Fem graft stenosis. He was seen back in the office and arrangements were made for Sunday direct admission with hydration, Lovenox-Coumadin crossover pre-procedure, and hydration. He was setup with Lovenox as outpatient prior to admission. He is scheduled to undergo PV angio and PTA tomorrow. Otherwise denies any other symptoms.  Past Medical History  Diagnosis Date  . Mechanical heart valve present     a. Hx mechanical St Jude AVR 2000.  . Cataracts, bilateral   . OSA (obstructive sleep apnea)   . Permanent atrial fibrillation   . Ischemic cardiomyopathy   . History of echocardiogram 04/17/2013    EF 40-45%; LV hypertrophy; LV mild-mod dilated; AV regurg.; LA severely diated, RA mildly dilated  . History of Doppler ultrasound 04/05/2013    LEAs; small distal abd. aoritc aneuysm is stable; fem-pop graft to R leg has  50-69% blockage  . History of nuclear stress test 05/14/2011    Dipyridamole; moderate perfusion defect in Basal Infrioer & Mid Inferior region - consistent w/infarct or scar; global LV systolic function mildly reduced; EKG negative for ischemia; low risk scan  . COPD (chronic obstructive pulmonary disease)   . Hyperlipidemia   . Hypertension   . Kidney cysts   . CKD (chronic kidney disease), stage III   . Hiatal hernia   . GERD (gastroesophageal reflux disease)   . Prediabetes   . Gout   . AAA (abdominal aortic aneurysm)   . Vitamin B deficiency   . Peripheral arterial disease     a. s/p aorto bifem BG.  . CAD (coronary artery disease)     a. CABG '00. b. all SVGs occluded 2010. c. low risk Nuc 2012      Most Recent Cardiac Studies: 2D Echo 04/17/13 - Left ventricle: The cavity size was mildly to moderately dilated. There was hypertrophy, with an appearance of severe eccentric hypertrophy. Systolic function was mildly to moderately reduced. The estimated ejection fraction was in the range of 40% to 45%. Mild diffuse hypokinesis. The study is not technically sufficient to allow evaluation of LV diastolic function. - Regional wall motion abnormality: Moderate hypokinesis of the mid inferior and mid inferolateral myocardium. - Ventricular septum: Septal motion showed "bounce". - Aortic valve: Mildly calcified annulus. Moderately calcified leaflets. A bileaflet prosthesis was present. Mild to moderate regurgitation. Valve area: 1.13cm^2(VTI). Valve area: 1.12cm^2 (Vmax). - Mitral valve: Calcified annulus. Valve area by pressure  half-time: 2.06cm^2. Valve area by continuity equation (using LVOT flow): 1.81cm^2. - Left atrium: The atrium was severely dilated. - Right atrium: The atrium was mildly dilated. - Pulmonary arteries: PA peak pressure: 46mm Hg (S).     Surgical History:  Past Surgical History  Procedure Laterality Date  . Stents in kidneys    . Coronary artery bypass  graft  2000    3 vessel; LIMA to LAD; RCA, OM  . Shunts in femoral arteries    . Cardioversion  8/282012  . Aortic valve replacement      St. Jude  . Femoral bypass    . Cardiac catheterization  11/25/2008    loss of 2/3 (RCA, OM) bypass grafts with patent internal mammary artery to LAD; large collateral filling of RCA ; osteal narrowing of circumflex of ~50%     Home Meds: Prior to Admission medications   Medication Sig Start Date End Date Taking? Authorizing Provider  albuterol (PROVENTIL) (2.5 MG/3ML) 0.083% nebulizer solution Take 2.5 mg by nebulization every 6 (six) hours as needed for wheezing or shortness of breath.   Yes Historical Provider, MD  aspirin EC 325 MG EC tablet Take 1 tablet (325 mg total) by mouth daily. 12/04/13  Yes Cecilie Kicks, NP  benzonatate (TESSALON) 100 MG capsule Take 100 mg by mouth 3 (three) times daily as needed for cough. 10/08/13 10/08/14 Yes Melissa R Smith, PA-C  bumetanide (BUMEX) 2 MG tablet Take 1 tablet (2 mg total) by mouth 2 (two) times daily. 12/05/13  Yes Melissa R Smith, PA-C  cetirizine (ZYRTEC) 10 MG tablet Take 10 mg by mouth daily.   Yes Historical Provider, MD  Cholecalciferol (VITAMIN D PO) Take 5,000 Units by mouth daily.    Yes Historical Provider, MD  citalopram (CELEXA) 40 MG tablet Take 40 mg by mouth daily.  09/17/12  Yes Historical Provider, MD  clonazePAM (KLONOPIN) 1 MG tablet Take 1 mg by mouth 3 (three) times daily as needed for anxiety.  09/10/13  Yes Vicie Mutters, PA-C  Cyanocobalamin (VITAMIN B 12 PO) Take 1,500 mg by mouth daily.    Yes Historical Provider, MD  diltiazem (TIAZAC) 120 MG 24 hr capsule Take 120 mg by mouth every other day.   Yes Historical Provider, MD  enoxaparin (LOVENOX) 100 MG/ML injection Inject 1 mL (100 mg total) into the skin every 12 (twelve) hours. 12/31/13  Yes Lorretta Harp, MD  febuxostat (ULORIC) 40 MG tablet Take 40 mg by mouth daily. For Gout 11/15/13  Yes Unk Pinto, MD  fenofibrate  micronized (LOFIBRA) 134 MG capsule Take 134 mg by mouth daily before breakfast.  09/17/12  Yes Historical Provider, MD  Ferrous Sulfate Dried (FERROUS SULFATE CR PO) Take 1 tablet by mouth daily.    Yes Historical Provider, MD  ipratropium-albuterol (DUONEB) 0.5-2.5 (3) MG/3ML SOLN Inhale 3 mLs into the lungs every 6 (six) hours as needed.  06/21/12  Yes Historical Provider, MD  isosorbide mononitrate (IMDUR) 30 MG 24 hr tablet Take 30 mg by mouth daily.  09/17/12  Yes Historical Provider, MD  levothyroxine (SYNTHROID, LEVOTHROID) 50 MCG tablet Take 50 mcg by mouth daily before breakfast.   Yes Historical Provider, MD  nitroGLYCERIN (NITROSTAT) 0.4 MG SL tablet Place 0.4 mg under the tongue every 5 (five) minutes as needed for chest pain.   Yes Historical Provider, MD  pravastatin (PRAVACHOL) 40 MG tablet Take 40 mg by mouth daily.   Yes Historical Provider, MD  tiotropium (SPIRIVA) 18 MCG inhalation  capsule Place 18 mcg into inhaler and inhale daily.   Yes Historical Provider, MD  warfarin (COUMADIN) 5 MG tablet Take 5-7.5 mg by mouth daily. 7.5 mg Monday Wednesday Friday and 5 mg Tuesday Thursday Saturday and Sunday 10/08/13  Yes Ardis Hughs, PA-C  Lovenox injections as outpatient - stopped Coumadin Tuesday & started Lovenox 100mg  BID on Thursday (pharmacy will be fixing home med)  Allergies:  Allergies  Allergen Reactions  . Chantix [Varenicline] Nausea And Vomiting  . Lyrica [Pregabalin]     Swelling, couldn't breathe  . Nsaids Other (See Comments)    Unknown   . Simvastatin     Pt said he had a "bleed out" after taking it  . Ziac [Bisoprolol-Hydrochlorothiazide]     fatigue    History   Social History  . Marital Status: Married    Spouse Name: N/A    Number of Children: 3  . Years of Education: N/A   Occupational History  . Not on file.   Social History Main Topics  . Smoking status: Current Every Day Smoker -- 0.50 packs/day for 40 years    Types: Cigarettes  . Smokeless  tobacco: Never Used  . Alcohol Use: No  . Drug Use: No  . Sexual Activity: Not on file   Other Topics Concern  . Not on file   Social History Narrative   Lives in [place] with disabled wife, three sons, and grandson   Retired     Family History  Problem Relation Age of Onset  . Heart attack Father   . Heart attack Mother   . Lung cancer Sister     x 2  . Kidney cancer Sister   . Kidney disease Sister   . Cancer Brother     ?  . Stroke Brother     Review of Systems: All other systems reviewed and are otherwise negative except as noted above.  Labs: this admission   Lab Results  Component Value Date   WBC 8.0 12/18/2013   HGB 12.4* 12/18/2013   HCT 37.3* 12/18/2013   MCV 91.2 12/18/2013   PLT 315 12/18/2013   Lab Results  Component Value Date   CHOL 162 11/15/2013   HDL 35* 11/15/2013   LDLCALC 93 11/15/2013   TRIG 171* 11/15/2013   Radiology/Studies:  Ct Chest Wo Contrast 12/20/2013   CLINICAL DATA:  Right pleural effusion.  Possible mass.  EXAM: CT CHEST WITHOUT CONTRAST  TECHNIQUE: Multidetector CT imaging of the chest was performed following the standard protocol without IV contrast.  COMPARISON:  DG CHEST 2 VIEW dated 12/04/2013; CT CHEST W/O CM dated 03/02/2013; MR MRA ABD W/O CM dated 05/09/2013  FINDINGS: Coronary atherosclerosis. Aortic valve prosthesis. Small hiatal hernia.  Stable right pleural calcifications. No definite left pleural calcification.  Prior CABG.  The blunting of the right posterior costophrenic angle shown on the prior chest radiograph is no longer present. However, there is fluid density in the posterior portion of the right major fissure compatible with a small residual amount of pleural fluid. This pleural fluid is slightly increased compared to 03/02/13 but dramatically reduced from 12/04/2013.  Thoracic spondylosis is present. Bilateral renal cysts noted. Stable cardiomegaly.  IMPRESSION: 1. Pleural fluid and edema shown on the 12/04/2013 exam has  almost completely resolved. There is only a trace amount of residual pleural fluid in the right posterior major fissure. 2. Chronic pleural calcification on the right, probably from remote hemothorax or empyema, less likely due  to remote asbestos exposure. 3. Bilateral renal cysts. 4. Small hiatal hernia. 5. Stable cardiomegaly.   Electronically Signed   By: Sherryl Barters M.D.   On: 12/20/2013 17:07     EKG: atrial fib 58bpm nonspecific T wave changes NSIVCD  Physical Exam: Blood pressure 124/59, pulse 69, temperature 97.8 F (36.6 C), temperature source Oral, resp. rate 18, SpO2 96.00%. General: Well developed, well nourished WM in no acute distress. EXTREMELY HARD OF HEARING - HEARS BEST ON R EAR SIDE Head: Normocephalic, atraumatic, sclera non-icteric, no xanthomas, nares are without discharge.  Neck: Negative for carotid bruits. JVD not elevated. Lungs: Clear bilaterally to auscultation without wheezes, rales, or rhonchi. Breathing is unlabored. Heart: Controlled rate, slightly irregular with S1 S2. No murmurs, rubs, or gallops appreciated. Abdomen: Soft, non-tender, non-distended with normoactive bowel sounds. No hepatomegaly. No rebound/guarding. No obvious abdominal masses. Msk:  Strength and tone appear normal for age. Extremities: No clubbing or cyanosis. No edema.  Distal pedal pulses somewhat diminished bilaterally. Neuro: Alert and oriented X 3. No focal deficit. No facial asymmetry. Moves all extremities spontaneously. Psych:  Responds to questions appropriately with a normal affect.    ASSESSMENT AND PLAN:   1. PAD with lifestyle limiting claudication 2. CKD stage III 3. S/p Mechanical AVR 4. CAD s/p remote CABG, no recent angina 5. OSA  Admit, plan PV angio tomorrow - appears to be scheduled for 10am. Check basic labs including Cr, PT/INR. Will transition to heparin per pharmacy for ease of turning off tomorrow for angio. Start gentle hydration this evening. Hold Bumex.  CPAP qhs. Baseline CXR since >1 mo since this was done.  Pt agreeable to plan. Of note pt says he has been taking Dilt 120mg  daily rather than every other day as listed on med rec. HR, BP stable. Will continue home meds otherwise (took his usual ones at home already today).   If he stays through tomorrow night (which he probably will) please upgrade status to inpt.  Signed, Melina Copa PA-C 01/06/2014, 2:19 PM  Patient seen and examined with Melina Copa, PA-C. We discussed all aspects of the encounter. I agree with the assessment and plan as stated above.   Will start IV hydration and heparin for AVR. Plan PV angio tomorrow with Dr. Gwenlyn Stanton. Orders written.  Tanav Orsak,MD 3:18 PM

## 2014-01-06 NOTE — Progress Notes (Signed)
Pt ghas home cpap and places slef on/off.

## 2014-01-06 NOTE — Progress Notes (Signed)
ANTICOAGULATION CONSULT NOTE - Initial Consult  Pharmacy Consult for Heparin Indication: St. Jude AVR, AFib  Allergies  Allergen Reactions  . Chantix [Varenicline] Nausea And Vomiting  . Lyrica [Pregabalin]     Swelling, couldn't breathe  . Nsaids Other (See Comments)    Unknown   . Simvastatin     Pt said he had a "bleed out" after taking it  . Ziac [Bisoprolol-Hydrochlorothiazide]     fatigue    Patient Measurements: Height: 5\' 9"  (175.3 cm) IBW/kg (Calculated) : 70.7 Heparin Dosing Weight:   Vital Signs: Temp: 97.8 F (36.6 C) (03/22 1300) Temp src: Oral (03/22 1300) BP: 124/59 mmHg (03/22 1300) Pulse Rate: 69 (03/22 1300)  Labs: No results found for this basename: HGB, HCT, PLT, APTT, LABPROT, INR, HEPARINUNFRC, CREATININE, CKTOTAL, CKMB, TROPONINI,  in the last 72 hours  The CrCl is unknown because both a height and weight (above a minimum accepted value) are required for this calculation.   Medical History: Past Medical History  Diagnosis Date  . Mechanical heart valve present     a. Hx mechanical St Jude AVR 2000.  . Cataracts, bilateral   . OSA (obstructive sleep apnea)   . Permanent atrial fibrillation   . Ischemic cardiomyopathy   . History of echocardiogram 04/17/2013    EF 40-45%; LV hypertrophy; LV mild-mod dilated; AV regurg.; LA severely diated, RA mildly dilated  . History of Doppler ultrasound 04/05/2013    LEAs; small distal abd. aoritc aneuysm is stable; fem-pop graft to R leg has 50-69% blockage  . History of nuclear stress test 05/14/2011    Dipyridamole; moderate perfusion defect in Basal Infrioer & Mid Inferior region - consistent w/infarct or scar; global LV systolic function mildly reduced; EKG negative for ischemia; low risk scan  . COPD (chronic obstructive pulmonary disease)   . Hyperlipidemia   . Hypertension   . Kidney cysts   . CKD (chronic kidney disease), stage III   . Hiatal hernia   . GERD (gastroesophageal reflux disease)   .  Prediabetes   . Gout   . AAA (abdominal aortic aneurysm)   . Vitamin B deficiency   . Peripheral arterial disease     a. s/p aorto bifem BG.  . CAD (coronary artery disease)     a. CABG '00. b. all SVGs occluded 2010. c. low risk Nuc 2012     Medications:  Scheduled:  . [START ON 01/07/2014] aspirin EC  325 mg Oral Daily  . [START ON 01/07/2014] cholecalciferol  5,000 Units Oral Daily  . [START ON 01/07/2014] citalopram  40 mg Oral Daily  . [START ON 01/07/2014] vitamin B-12  1,500 mcg Oral Daily  . [START ON 01/07/2014] diltiazem  120 mg Oral Daily  . [START ON 01/07/2014] febuxostat  40 mg Oral Daily  . [START ON 01/07/2014] fenofibrate  160 mg Oral Daily  . [START ON 01/07/2014] ferrous sulfate  325 mg Oral Q breakfast  . [START ON 01/07/2014] isosorbide mononitrate  30 mg Oral Daily  . [START ON 01/07/2014] levothyroxine  50 mcg Oral QAC breakfast  . [START ON 01/07/2014] loratadine  10 mg Oral Daily  . [START ON 01/07/2014] pravastatin  40 mg Oral q1800  . sodium chloride  3 mL Intravenous Q12H  . [START ON 01/07/2014] tiotropium  18 mcg Inhalation Daily    Assessment: 73yo with PAD admitted for PV angio and intervention in AM.  Pt is on Coumadin at home, his last dose being 3/17 and has  since been bridged with Lovenox 100mg  SQ q12.  His last dose of Lovenox was at 9AM.    Goal of Therapy:  Heparin level 0.3-0.7 units/ml Monitor platelets by anticoagulation protocol: Yes   Plan:  1-  F/U admission labs 2-  Heparin will not be started until ~6PM  Gracy Bruins, PharmD Clinical Pharmacist [facility]

## 2014-01-07 ENCOUNTER — Encounter (HOSPITAL_COMMUNITY)
Admission: RE | Disposition: A | Discharge status: Home / Self Care | Payer: Self-pay | Source: Ambulatory Visit | Attending: Cardiovascular Disease

## 2014-01-07 DIAGNOSIS — I70219 Atherosclerosis of native arteries of extremities with intermittent claudication, unspecified extremity: Secondary | ICD-10-CM

## 2014-01-07 HISTORY — PX: LOWER EXTREMITY ANGIOGRAM: SHX5508

## 2014-01-07 LAB — BASIC METABOLIC PANEL
BUN: 23 mg/dL (ref 6–23)
CALCIUM: 8.8 mg/dL (ref 8.4–10.5)
CO2: 25 mEq/L (ref 19–32)
Chloride: 103 mEq/L (ref 96–112)
Creatinine, Ser: 1.8 mg/dL — ABNORMAL HIGH (ref 0.50–1.35)
GFR, EST AFRICAN AMERICAN: 42 mL/min — AB (ref >90)
GFR, EST NON AFRICAN AMERICAN: 36 mL/min — AB (ref >90)
GLUCOSE: 111 mg/dL — AB (ref 70–99)
POTASSIUM: 4.5 meq/L (ref 3.7–5.3)
Sodium: 139 mEq/L (ref 137–147)

## 2014-01-07 LAB — CBC
HEMATOCRIT: 33.6 % — AB (ref 39.0–52.0)
HEMOGLOBIN: 11.1 g/dL — AB (ref 13.0–17.0)
MCH: 30.5 pg (ref 26.0–34.0)
MCHC: 33 g/dL (ref 30.0–36.0)
MCV: 92.3 fL (ref 78.0–100.0)
Platelets: 190 10*3/uL (ref 150–400)
RBC: 3.64 MIL/uL — ABNORMAL LOW (ref 4.22–5.81)
RDW: 14.7 % (ref 11.5–15.5)
WBC: 7.1 10*3/uL (ref 4.0–10.5)

## 2014-01-07 LAB — PROTIME-INR
INR: 1.24 (ref 0.00–1.49)
Prothrombin Time: 15.3 seconds — ABNORMAL HIGH (ref 11.6–15.2)

## 2014-01-07 LAB — POCT ACTIVATED CLOTTING TIME
ACTIVATED CLOTTING TIME: 210 s
Activated Clotting Time: 193 seconds
Activated Clotting Time: 171 seconds

## 2014-01-07 LAB — HEPARIN LEVEL (UNFRACTIONATED): HEPARIN UNFRACTIONATED: 0.81 [IU]/mL — AB (ref 0.30–0.70)

## 2014-01-07 SURGERY — ANGIOGRAM, LOWER EXTREMITY
Anesthesia: LOCAL

## 2014-01-07 MED ORDER — HEPARIN (PORCINE) IN NACL 100-0.45 UNIT/ML-% IJ SOLN
1800.0000 [IU]/h | INTRAMUSCULAR | Status: DC
  Administered 2014-01-07: 19:00:00 1150 [IU]/h via INTRAVENOUS
  Administered 2014-01-08: 15:00:00 1550 [IU]/h via INTRAVENOUS
  Administered 2014-01-08: 1400 [IU]/h via INTRAVENOUS
  Administered 2014-01-09 – 2014-01-10 (×3): 1550 [IU]/h via INTRAVENOUS
  Administered 2014-01-11: 06:00:00 1800 [IU]/h via INTRAVENOUS
  Administered 2014-01-11: 02:00:00 1550 [IU]/h via INTRAVENOUS
  Filled 2014-01-07 (×7): qty 250

## 2014-01-07 MED ORDER — HEPARIN SODIUM (PORCINE) 1000 UNIT/ML IJ SOLN
INTRAMUSCULAR | Status: AC
  Filled 2014-01-07: qty 1

## 2014-01-07 MED ORDER — MORPHINE SULFATE 2 MG/ML IJ SOLN: 2.0000 mg | INTRAMUSCULAR | Status: DC | PRN

## 2014-01-07 MED ORDER — LIDOCAINE HCL (PF) 1 % IJ SOLN
INTRAMUSCULAR | Status: AC
  Filled 2014-01-07: qty 30

## 2014-01-07 MED ORDER — HEPARIN (PORCINE) IN NACL 2-0.9 UNIT/ML-% IJ SOLN
INTRAMUSCULAR | Status: AC
  Filled 2014-01-07: qty 1000

## 2014-01-07 MED ORDER — WARFARIN SODIUM 7.5 MG PO TABS
7.5000 mg | ORAL_TABLET | Freq: Once | ORAL | Status: AC
  Administered 2014-01-07: 18:00:00 7.5 mg via ORAL
  Filled 2014-01-07: qty 1

## 2014-01-07 MED ORDER — WARFARIN - PHARMACIST DOSING INPATIENT
Freq: Every day | Status: DC
  Administered 2014-01-07 – 2014-01-10 (×3)

## 2014-01-07 MED ORDER — HEPARIN (PORCINE) IN NACL 2-0.9 UNIT/ML-% IJ SOLN
INTRAMUSCULAR | Status: AC
  Filled 2014-01-07: qty 500

## 2014-01-07 MED ORDER — SODIUM CHLORIDE 0.9 % IV SOLN
INTRAVENOUS | Status: AC
  Administered 2014-01-07: 75 mL/h via INTRAVENOUS

## 2014-01-07 MED ORDER — ASPIRIN EC 325 MG PO TBEC
325.0000 mg | DELAYED_RELEASE_TABLET | Freq: Every day | ORAL | Status: DC
  Administered 2014-01-07 – 2014-01-11 (×5): 325 mg via ORAL
  Filled 2014-01-07 (×4): qty 1

## 2014-01-07 NOTE — Progress Notes (Signed)
ANTICOAGULATION CONSULT NOTE - Follow Up Consult  Pharmacy Consult for Heparin  Indication: St Jude AVR, Afib  Allergies  Allergen Reactions  . Chantix [Varenicline] Nausea And Vomiting  . Lyrica [Pregabalin]     Swelling, couldn't breathe  . Nsaids Other (See Comments)    Unknown   . Simvastatin     Pt said he had a "bleed out" after taking it  . Ziac [Bisoprolol-Hydrochlorothiazide]     fatigue    Patient Measurements: Height: 5\' 9"  (175.3 cm) Weight: 229 lb 11.5 oz (104.2 kg) IBW/kg (Calculated) : 70.7  Vital Signs: Temp: 98.8 F (37.1 C) (03/22 2100) Temp src: Oral (03/22 2100) BP: 139/62 mmHg (03/22 2100) Pulse Rate: 52 (03/22 2100)  Labs:  Recent Labs  01/06/14 1530 01/07/14 0354  HGB 12.4* 11.1*  HCT 36.4* 33.6*  PLT 218 190  LABPROT 14.8 15.3*  INR 1.19 1.24  HEPARINUNFRC  --  0.81*  CREATININE 1.71*  --     Estimated Creatinine Clearance: 47.1 ml/min (by C-G formula based on Cr of 1.71).   Medications:  Heparin 1350 units/hr  Assessment: 73 y/o M on heparin while awaiting PV angio with intervention this AM. HL is 0.81. Other labs as above.   Goal of Therapy:  Heparin level 0.3-0.7 units/ml Monitor platelets by anticoagulation protocol: Yes   Plan:  -Decrease heparin to 1200 units/hr -PV Angio scheduled for 1000, no scheduled HL for now -F/U after procedure for [facility] plans  Narda Bonds 01/07/2014,4:32 AM

## 2014-01-07 NOTE — Progress Notes (Signed)
Patient Profile: 73 y/o male with h/o CAD, s/p CABG, stage III CKD and PAD with decreased ABIs and lifestyle limiting claudication, who has been admitted for pre cath hydration, prior to undergoing a PV Angiogram.   Subjective: No complaints. Denies resting leg pain.   Objective: Vital signs in last 24 hours: Temp:  [97.6 F (36.4 C)-98.8 F (37.1 C)] 97.6 F (36.4 C) (03/23 0500) Pulse Rate:  [52-69] 67 (03/23 0500) Resp:  [16-18] 16 (03/23 0500) BP: (124-139)/(59-62) 127/62 mmHg (03/23 0500) SpO2:  [96 %-100 %] 98 % (03/23 0500) Weight:  [229 lb 11.5 oz (104.2 kg)-232 lb 12.9 oz (105.6 kg)] 232 lb 12.9 oz (105.6 kg) (03/23 0500) Last BM Date: 01/05/14  Intake/Output from previous day: 03/22 0701 - 03/23 0700 In: 81.3 [I.V.:81.3] Out: -  Intake/Output this shift:    Medications Current Facility-Administered Medications  Medication Dose Route Frequency Provider Last Rate Last Dose  . 0.9 %  sodium chloride infusion  250 mL Intravenous PRN Dayna N Dunn, PA-C      . 0.9 %  sodium chloride infusion   Intravenous Continuous Dayna N Dunn, PA-C 75 mL/hr at 01/06/14 1800    . acetaminophen (TYLENOL) tablet 650 mg  650 mg Oral Q4H PRN Dayna N Dunn, PA-C      . albuterol (PROVENTIL) (2.5 MG/3ML) 0.083% nebulizer solution 2.5 mg  2.5 mg Nebulization Q6H PRN Dayna N Dunn, PA-C      . aspirin EC tablet 325 mg  325 mg Oral Daily Dayna N Dunn, PA-C      . benzonatate (TESSALON) capsule 100 mg  100 mg Oral TID PRN Dayna N Dunn, PA-C      . cholecalciferol (VITAMIN D) tablet 5,000 Units  5,000 Units Oral Daily Dayna N Dunn, PA-C      . citalopram (CELEXA) tablet 40 mg  40 mg Oral Daily Dayna N Dunn, PA-C      . clonazePAM (KLONOPIN) tablet 1 mg  1 mg Oral TID PRN Dayna N Dunn, PA-C      . cyanocobalamin tablet 1,500 mcg  1,500 mcg Oral Daily Dayna N Dunn, PA-C      . diltiazem (TIAZAC) 24 hr capsule 120 mg  120 mg Oral Daily Dayna N Dunn, PA-C      . febuxostat (ULORIC) tablet 40 mg  40 mg  Oral Daily Dayna N Dunn, PA-C      . fenofibrate tablet 160 mg  160 mg Oral Daily Dayna N Dunn, PA-C      . ferrous sulfate tablet 325 mg  325 mg Oral Q breakfast Dayna N Dunn, PA-C   325 mg at 01/07/14 0644  . heparin ADULT infusion 100 units/mL (25000 units/250 mL)  1,200 Units/hr Intravenous Continuous Narda Bonds, RPH 12 mL/hr at 01/07/14 0440 1,200 Units/hr at 01/07/14 0440  . ipratropium-albuterol (DUONEB) 0.5-2.5 (3) MG/3ML nebulizer solution 3 mL  3 mL Inhalation Q6H PRN Dayna N Dunn, PA-C      . isosorbide mononitrate (IMDUR) 24 hr tablet 30 mg  30 mg Oral Daily Dayna N Dunn, PA-C      . levothyroxine (SYNTHROID, LEVOTHROID) tablet 50 mcg  50 mcg Oral QAC breakfast Dayna N Dunn, PA-C   50 mcg at 01/07/14 [phone]  . loratadine (CLARITIN) tablet 10 mg  10 mg Oral Daily Dayna N Dunn, PA-C      . nitroGLYCERIN (NITROSTAT) SL tablet 0.4 mg  0.4 mg Sublingual Q5 min PRN Dayna N Dunn, PA-C      .  pravastatin (PRAVACHOL) tablet 40 mg  40 mg Oral q1800 Lorretta Harp, MD      . sodium chloride 0.9 % injection 3 mL  3 mL Intravenous Q12H Dayna N Dunn, PA-C      . sodium chloride 0.9 % injection 3 mL  3 mL Intravenous PRN Dayna N Dunn, PA-C      . tiotropium (SPIRIVA) inhalation capsule 18 mcg  18 mcg Inhalation Daily Dayna N Dunn, PA-C        PE: General appearance: alert, cooperative and no distress Lungs: clear to auscultation bilaterally Heart: regular rate and rhythm, S1, S2 normal, no murmur, click, rub or gallop Extremities: no LEE Pulses: diminished bilateral DPs Skin: warm and dry Neurologic: Grossly normal  Lab Results:   Recent Labs  01/06/14 1530 01/07/14 0354  WBC 6.6 7.1  HGB 12.4* 11.1*  HCT 36.4* 33.6*  PLT 218 190   BMET  Recent Labs  01/06/14 1530 01/07/14 0354  NA 139 139  K 4.7 4.5  CL 99 103  CO2 28 25  GLUCOSE 135* 111*  BUN 21 23  CREATININE 1.71* 1.80*  CALCIUM 9.2 8.8   PT/INR  Recent Labs  01/06/14 1530 01/07/14 0354  LABPROT 14.8 15.3*   INR 1.19 1.24    Assessment/Plan  Active Problems:   PAD (peripheral artery disease),s/p aorto bifem BG, now with need for percutaneous transposition of R distal fem pop  Plan:  1. PAD w/ Claudication: Plan for PV angio +/-  Intervention with Dr. Gwenlyn Found today. INR is normal.   2. CKD: Pt was started on continuous IVFs last PM. Scr however has increased from 1.71 to 1.80, overnight. He will need further hydration post cath and will need to be monitored overnight. Will need to monitor fluid status as EF is 40-45%.   LOS: 1 day    [name] 01/07/2014 9:16 AM  Agree with note by Ellen Henri PAC. The patient underwent peripheral angiography on 12/04/03 revealing a high grade lesion in the distal aspect of his right femoropopliteal bypass graft. He is symptomatic. He is on Coumadin because of a St. Jude aVR. He has moderate renal insufficiency. The patient was brought in prior to the procedure for hydration. Serum creatinine is 1.8 with a creatinine clearance of 55 cc per minute. The plan will be to access the fem-fem crossover graft antegrade and then recanalized the distal right femoropopliteal bypass graft.  Lorretta Harp, M.D., [place], [facility], [place] [address] [place], [place]  [id number]  [phone] 01/07/2014 9:58 AM

## 2014-01-07 NOTE — H&P (View-Only) (Signed)
Patient Profile: 73 y/o male with h/o CAD, s/p CABG, stage III CKD and PAD with decreased ABIs and lifestyle limiting claudication, who has been admitted for pre cath hydration, prior to undergoing a PV Angiogram.   Subjective: No complaints. Denies resting leg pain.   Objective: Vital signs in last 24 hours: Temp:  [97.6 F (36.4 C)-98.8 F (37.1 C)] 97.6 F (36.4 C) (03/23 0500) Pulse Rate:  [52-69] 67 (03/23 0500) Resp:  [16-18] 16 (03/23 0500) BP: (124-139)/(59-62) 127/62 mmHg (03/23 0500) SpO2:  [96 %-100 %] 98 % (03/23 0500) Weight:  [229 lb 11.5 oz (104.2 kg)-232 lb 12.9 oz (105.6 kg)] 232 lb 12.9 oz (105.6 kg) (03/23 0500) Last BM Date: 01/05/14  Intake/Output from previous day: 03/22 0701 - 03/23 0700 In: 81.3 [I.V.:81.3] Out: -  Intake/Output this shift:    Medications Current Facility-Administered Medications  Medication Dose Route Frequency Provider Last Rate Last Dose  . 0.9 %  sodium chloride infusion  250 mL Intravenous PRN Dayna N Dunn, PA-C      . 0.9 %  sodium chloride infusion   Intravenous Continuous Dayna N Dunn, PA-C 75 mL/hr at 01/06/14 1800    . acetaminophen (TYLENOL) tablet 650 mg  650 mg Oral Q4H PRN Dayna N Dunn, PA-C      . albuterol (PROVENTIL) (2.5 MG/3ML) 0.083% nebulizer solution 2.5 mg  2.5 mg Nebulization Q6H PRN Dayna N Dunn, PA-C      . aspirin EC tablet 325 mg  325 mg Oral Daily Dayna N Dunn, PA-C      . benzonatate (TESSALON) capsule 100 mg  100 mg Oral TID PRN Dayna N Dunn, PA-C      . cholecalciferol (VITAMIN D) tablet 5,000 Units  5,000 Units Oral Daily Dayna N Dunn, PA-C      . citalopram (CELEXA) tablet 40 mg  40 mg Oral Daily Dayna N Dunn, PA-C      . clonazePAM (KLONOPIN) tablet 1 mg  1 mg Oral TID PRN Dayna N Dunn, PA-C      . cyanocobalamin tablet 1,500 mcg  1,500 mcg Oral Daily Dayna N Dunn, PA-C      . diltiazem (TIAZAC) 24 hr capsule 120 mg  120 mg Oral Daily Dayna N Dunn, PA-C      . febuxostat (ULORIC) tablet 40 mg  40 mg  Oral Daily Dayna N Dunn, PA-C      . fenofibrate tablet 160 mg  160 mg Oral Daily Dayna N Dunn, PA-C      . ferrous sulfate tablet 325 mg  325 mg Oral Q breakfast Dayna N Dunn, PA-C   325 mg at 01/07/14 0644  . heparin ADULT infusion 100 units/mL (25000 units/250 mL)  1,200 Units/hr Intravenous Continuous Narda Bonds, RPH 12 mL/hr at 01/07/14 0440 1,200 Units/hr at 01/07/14 0440  . ipratropium-albuterol (DUONEB) 0.5-2.5 (3) MG/3ML nebulizer solution 3 mL  3 mL Inhalation Q6H PRN Dayna N Dunn, PA-C      . isosorbide mononitrate (IMDUR) 24 hr tablet 30 mg  30 mg Oral Daily Dayna N Dunn, PA-C      . levothyroxine (SYNTHROID, LEVOTHROID) tablet 50 mcg  50 mcg Oral QAC breakfast Dayna N Dunn, PA-C   50 mcg at 01/07/14 [phone]  . loratadine (CLARITIN) tablet 10 mg  10 mg Oral Daily Dayna N Dunn, PA-C      . nitroGLYCERIN (NITROSTAT) SL tablet 0.4 mg  0.4 mg Sublingual Q5 min PRN Dayna N Dunn, PA-C      .  pravastatin (PRAVACHOL) tablet 40 mg  40 mg Oral q1800 Lorretta Harp, MD      . sodium chloride 0.9 % injection 3 mL  3 mL Intravenous Q12H Dayna N Dunn, PA-C      . sodium chloride 0.9 % injection 3 mL  3 mL Intravenous PRN Dayna N Dunn, PA-C      . tiotropium (SPIRIVA) inhalation capsule 18 mcg  18 mcg Inhalation Daily Dayna N Dunn, PA-C        PE: General appearance: alert, cooperative and no distress Lungs: clear to auscultation bilaterally Heart: regular rate and rhythm, S1, S2 normal, no murmur, click, rub or gallop Extremities: no LEE Pulses: diminished bilateral DPs Skin: warm and dry Neurologic: Grossly normal  Lab Results:   Recent Labs  01/06/14 1530 01/07/14 0354  WBC 6.6 7.1  HGB 12.4* 11.1*  HCT 36.4* 33.6*  PLT 218 190   BMET  Recent Labs  01/06/14 1530 01/07/14 0354  NA 139 139  K 4.7 4.5  CL 99 103  CO2 28 25  GLUCOSE 135* 111*  BUN 21 23  CREATININE 1.71* 1.80*  CALCIUM 9.2 8.8   PT/INR  Recent Labs  01/06/14 1530 01/07/14 0354  LABPROT 14.8 15.3*   INR 1.19 1.24    Assessment/Plan  Active Problems:   PAD (peripheral artery disease),s/p aorto bifem BG, now with need for percutaneous transposition of R distal fem pop  Plan:  1. PAD w/ Claudication: Plan for PV angio +/-  Intervention with Dr. Gwenlyn Found today. INR is normal.   2. CKD: Pt was started on continuous IVFs last PM. Scr however has increased from 1.71 to 1.80, overnight. He will need further hydration post cath and will need to be monitored overnight. Will need to monitor fluid status as EF is 40-45%.   LOS: 1 day    [name] 01/07/2014 9:16 AM  Agree with note by Ellen Henri PAC. The patient underwent peripheral angiography on 12/04/03 revealing a high grade lesion in the distal aspect of his right femoropopliteal bypass graft. He is symptomatic. He is on Coumadin because of a St. Jude aVR. He has moderate renal insufficiency. The patient was brought in prior to the procedure for hydration. Serum creatinine is 1.8 with a creatinine clearance of 55 cc per minute. The plan will be to access the fem-fem crossover graft antegrade and then recanalized the distal right femoropopliteal bypass graft.  Lorretta Harp, M.D., [place], [facility], [place] [address] [place], [place]  [id number]  [phone] 01/07/2014 9:58 AM

## 2014-01-07 NOTE — CV Procedure (Signed)
Robert Stanton is a 73 y.o. male    [id number] LOCATION:  FACILITY: [facility]  PHYSICIAN: Quay Burow, M.D. 1941/04/09   DATE OF PROCEDURE:  01/07/2014  DATE OF DISCHARGE:     PV Angiogram/Intervention    History obtained from chart review.Robert Stanton is a 73 year old gentleman with a history of CABG in 2000 with a LIMA to the LAD. All vein grafts were noted to be occluded at cath in 2010. He had a St. Jude AVR in '00 which is functioning. His EF is about 40-45% in 2012. He also has sleep apnea, COPD, and recurrent atrial fibrillation/flutter. He had cardioversion in 2012 but is now in chronic atrial fibrillation and asymptomatic  He was referred to Dr Gwenlyn Found for peripheral vascular evaluation for RLE claudication. He has an occluded left iliac system status post right-to-left fem-fem crossover graft as well as bilateral femoropopliteal bypass grafts. He has a right ABI 0.64. He had a PVA 12/03/13 by Dr Gwenlyn Found and this revealed a distal Rt Fem-Fem graft stenosis. He is in the office today for follow up. He continues to have claudication. He presents today for antegrade access of the fem-fem crossover graft, PTA of the distal aspect of the right femoropopliteal bypass graft for lifestyle limiting claudication.    PROCEDURE DESCRIPTION:   The patient was brought to the second floor [facility] cath lab in the postabsorptive state. He was premedicated with Valium 5 mg by mouth. His right groinwas prepped and shaved in usual sterile fashion. Xylocaine 1% was used  for local anesthesia. A 6 French sheath was inserted into the right side of the fem-fem crossover graft using standard Seldinger technique. Visipaque dye was used for the entirety of the case. Retrograde aortic pressure was monitored during the case.  HEMODYNAMICS:    AO SYSTOLIC/AO DIASTOLIC: 546/27   Angiographic Data:   I was able to cross the lesion in the distal aspect of the right femoropopliteal bypass graft  with a 0.14/300 cm long [place] core wire. After a therapeutic ACT was documented the lesion was predilated with a 5 mm x 4 cm chocolate balloon at nominal pressures. This resulted in a suboptimal and graphic result. The balloon was clearly smaller than the size of the vessel. I then upsized to a 7 mm x 4 cm 0.35 balloon and inflated at 12 atmospheres resulting reduction of a 90% lesion to less than 30% residual without dissection and with excellent flow. The patient tolerated the procedure well. The long 40 cm abraded sheath was exchanged for a short 6 [place] sheath over a 0.35 Rosen wire. The patient left the lab in stable condition. A total of 40 cc Visipaque dye was administered to the patient. He received 8000 units  of heparin with an ACT of 210.  IMPRESSION:1 successful PTCA of high-grade distal right femoropopliteal bypass graft stenosis just above the distal anastomosis using a 7 mm balloon with reduction of a 90% stenosis to less than 30%. Because the patient has a mechanical aortic valve I'm going to restart the heparin in 6 hours without bolus and will restart Coumadin tonight as well. With the patient's INR is therapeutic he'll be discharged home I will obtain followup lower extremity arterial Doppler studies in her [place] office.    Lorretta Harp MD, [facility] 01/07/2014 11:06 AM

## 2014-01-07 NOTE — Progress Notes (Signed)
ANTICOAGULATION CONSULT NOTE - Follow Up Consult  Pharmacy Consult for Heparin  Indication: St Jude AVR, Afib  Allergies  Allergen Reactions  . Chantix [Varenicline] Nausea And Vomiting  . Lyrica [Pregabalin]     Swelling, couldn't breathe  . Nsaids Other (See Comments)    Unknown   . Simvastatin     Pt said he had a "bleed out" after taking it  . Ziac [Bisoprolol-Hydrochlorothiazide]     fatigue    Patient Measurements: Height: 5\' 9"  (175.3 cm) Weight: 232 lb 12.9 oz (105.6 kg) IBW/kg (Calculated) : 70.7  Vital Signs: Temp: 98.4 F (36.9 C) (03/23 1330) Temp src: Oral (03/23 1330) BP: 159/64 mmHg (03/23 1330) Pulse Rate: 58 (03/23 1330)  Labs:  Recent Labs  01/06/14 1530 01/07/14 0354  HGB 12.4* 11.1*  HCT 36.4* 33.6*  PLT 218 190  LABPROT 14.8 15.3*  INR 1.19 1.24  HEPARINUNFRC  --  0.81*  CREATININE 1.71* 1.80*    Estimated Creatinine Clearance: 45.1 ml/min (by C-G formula based on Cr of 1.8).   Assessment: 73 y/o M s/p successful PTCA today. Since pt. Has mechanical aortic valve, pharmacy is consulted to resume heparin with no bolus 6 hrs after sheath removal (~ 1300), and coumadin. Heparin level was supratherapeutic on 1350 units/hr. INR 1.24. hgb 11.1, plt 190.   PTA Coumadin dose: 7.5 mg Monday Wednesday Friday and 5 mg Tuesday Thursday Saturday and Sunday, last dose was on 3/17  Goal of Therapy:  Heparin level 0.3-0.7 units/ml Monitor platelets by anticoagulation protocol: Yes   Plan:  - Resume Heparin infusion 1150 units/hr at 1900 - Coumadin 7.5mg  po x1  - Daily Heparin level, cbc, and PT/INR  Maryanna Shape, PharmD, BCPS  Clinical Pharmacist  Pager: [phone]   01/07/2014,2:12 PM

## 2014-01-07 NOTE — Interval H&P Note (Signed)
History and Physical Interval Note:  01/07/2014 9:59 AM  Robert Stanton  has presented today for surgery, with the diagnosis of claudication  The various methods of treatment have been discussed with the patient and family. After consideration of risks, benefits and other options for treatment, the patient has consented to  Procedure(s): LOWER EXTREMITY ANGIOGRAM (N/A) as a surgical intervention .  The patient's history has been reviewed, patient examined, no change in status, stable for surgery.  I have reviewed the patient's chart and labs.  Questions were answered to the patient's satisfaction.     Lorretta Harp

## 2014-01-08 DIAGNOSIS — Z7901 Long term (current) use of anticoagulants: Secondary | ICD-10-CM

## 2014-01-08 LAB — BASIC METABOLIC PANEL
BUN: 24 mg/dL — ABNORMAL HIGH (ref 6–23)
CO2: 24 mEq/L (ref 19–32)
Calcium: 8.8 mg/dL (ref 8.4–10.5)
Chloride: 103 mEq/L (ref 96–112)
Creatinine, Ser: 1.71 mg/dL — ABNORMAL HIGH (ref 0.50–1.35)
GFR calc Af Amer: 45 mL/min — ABNORMAL LOW (ref >90)
GFR calc non Af Amer: 38 mL/min — ABNORMAL LOW (ref >90)
Glucose, Bld: 118 mg/dL — ABNORMAL HIGH (ref 70–99)
Potassium: 4.3 mEq/L (ref 3.7–5.3)
Sodium: 140 mEq/L (ref 137–147)

## 2014-01-08 LAB — CBC
HCT: 32.6 % — ABNORMAL LOW (ref 39.0–52.0)
HEMOGLOBIN: 10.9 g/dL — AB (ref 13.0–17.0)
MCH: 31.1 pg (ref 26.0–34.0)
MCHC: 33.4 g/dL (ref 30.0–36.0)
MCV: 92.9 fL (ref 78.0–100.0)
Platelets: 198 10*3/uL (ref 150–400)
RBC: 3.51 MIL/uL — ABNORMAL LOW (ref 4.22–5.81)
RDW: 15.1 % (ref 11.5–15.5)
WBC: 5.9 10*3/uL (ref 4.0–10.5)

## 2014-01-08 LAB — POCT ACTIVATED CLOTTING TIME: Activated Clotting Time: 182 seconds

## 2014-01-08 LAB — HEPARIN LEVEL (UNFRACTIONATED)
Heparin Unfractionated: 0.23 IU/mL — ABNORMAL LOW (ref 0.30–0.70)
Heparin Unfractionated: 0.23 IU/mL — ABNORMAL LOW (ref 0.30–0.70)
Heparin Unfractionated: 0.36 IU/mL (ref 0.30–0.70)

## 2014-01-08 LAB — PROTIME-INR
INR: 1.14 (ref 0.00–1.49)
PROTHROMBIN TIME: 14.4 s (ref 11.6–15.2)

## 2014-01-08 MED ORDER — WARFARIN SODIUM 10 MG PO TABS
10.0000 mg | ORAL_TABLET | Freq: Once | ORAL | Status: AC
  Administered 2014-01-08: 18:00:00 10 mg via ORAL
  Filled 2014-01-08: qty 1

## 2014-01-08 MED ORDER — SODIUM CHLORIDE 0.9 % IV SOLN
INTRAVENOUS | Status: DC
  Administered 2014-01-09: 21:00:00 via INTRAVENOUS

## 2014-01-08 NOTE — Progress Notes (Signed)
UR completed. Patient changed to inpatient- requiring IV heparin gtt  

## 2014-01-08 NOTE — Progress Notes (Signed)
[place] for Heparin/Coumadin Indication: St Jude AVR, Afib  Allergies  Allergen Reactions  . Chantix [Varenicline] Nausea And Vomiting  . Lyrica [Pregabalin]     Swelling, couldn't breathe  . Nsaids Other (See Comments)    Unknown   . Simvastatin     Pt said he had a "bleed out" after taking it  . Ziac [Bisoprolol-Hydrochlorothiazide]     fatigue    Patient Measurements: Height: 5\' 9"  (175.3 cm) Weight: 233 lb 11 oz (106 kg) IBW/kg (Calculated) : 70.7  Vital Signs: Temp: 97.7 F (36.5 C) (03/24 1300) Temp src: Oral (03/24 1300) BP: 126/61 mmHg (03/24 1300) Pulse Rate: 59 (03/24 1300)  Labs:  Recent Labs  01/06/14 1530 01/07/14 0354 01/08/14 0355 01/08/14 1345  HGB 12.4* 11.1* 10.9*  --   HCT 36.4* 33.6* 32.6*  --   PLT 218 190 198  --   LABPROT 14.8 15.3* 14.4  --   INR 1.19 1.24 1.14  --   HEPARINUNFRC  --  0.81* 0.23* 0.23*  CREATININE 1.71* 1.80* 1.71*  --     Estimated Creatinine Clearance: 47.5 ml/min (by C-G formula based on Cr of 1.71).   Assessment: 73 yo male s/p fem-pop bypass graft PTCA,  h/o Afib/AVR, on IV heparin bridge to coumadin. INR 1.14 remains subtherapeutic. hepairn level 0.23, no improvement since rate increase this morning, per RN, no issue or interruption with infusion.   Goal of Therapy:  INR 2.5-3.5 Heparin level 0.3-0.7 units/ml Monitor platelets by anticoagulation protocol: Yes   Plan:  Increase Heparin 1550 units/hr Check heparin level in 8 hours. Coumadin 10 mg tonight as ordered.  Maryanna Shape, PharmD, BCPS  Clinical Pharmacist  Pager: [phone]  01/08/2014,2:52 PM

## 2014-01-08 NOTE — Progress Notes (Signed)
Pt using home CPAP. Does not need any assistance from RT.

## 2014-01-08 NOTE — Progress Notes (Signed)
Patient placed himself on home CPAP unit for the night. Sp02 level 96%

## 2014-01-08 NOTE — Progress Notes (Signed)
[place] for Heparin/Coumadin Indication: St Jude AVR, Afib  Allergies  Allergen Reactions  . Chantix [Varenicline] Nausea And Vomiting  . Lyrica [Pregabalin]     Swelling, couldn't breathe  . Nsaids Other (See Comments)    Unknown   . Simvastatin     Pt said he had a "bleed out" after taking it  . Ziac [Bisoprolol-Hydrochlorothiazide]     fatigue    Patient Measurements: Height: 5\' 9"  (175.3 cm) Weight: 233 lb 11 oz (106 kg) IBW/kg (Calculated) : 70.7  Vital Signs: Temp: 97.2 F (36.2 C) (03/24 0412) Temp src: Oral (03/24 0412) BP: 136/47 mmHg (03/24 0412) Pulse Rate: 55 (03/24 0412)  Labs:  Recent Labs  01/06/14 1530 01/07/14 0354 01/08/14 0355  HGB 12.4* 11.1* 10.9*  HCT 36.4* 33.6* 32.6*  PLT 218 190 198  LABPROT 14.8 15.3* 14.4  INR 1.19 1.24 1.14  HEPARINUNFRC  --  0.81* 0.23*  CREATININE 1.71* 1.80* 1.71*    Estimated Creatinine Clearance: 47.5 ml/min (by C-G formula based on Cr of 1.71).   Assessment: 73 yo male s/p fem-pop bypass graft PTCA,  h/o Afib/AVR, for anticoagulation  Goal of Therapy:  INR 2.5-3.5 Heparin level 0.3-0.7 units/ml Monitor platelets by anticoagulation protocol: Yes   Plan:  Increase Heparin 1400 units/hr Check heparin level in 8 hours. Coumadin 10 mg tonight  Phillis Knack, PharmD, BCPS  01/08/2014,5:34 AM

## 2014-01-08 NOTE — Progress Notes (Signed)
[place] for Heparin Indication: St Jude AVR, Afib  Allergies  Allergen Reactions  . Chantix [Varenicline] Nausea And Vomiting  . Lyrica [Pregabalin]     Swelling, couldn't breathe  . Nsaids Other (See Comments)    Unknown   . Simvastatin     Pt said he had a "bleed out" after taking it  . Ziac [Bisoprolol-Hydrochlorothiazide]     fatigue    Patient Measurements: Height: 5\' 9"  (175.3 cm) Weight: 233 lb 11 oz (106 kg) IBW/kg (Calculated) : 70.7  Vital Signs: Temp: 97.4 F (36.3 C) (03/24 1937) Temp src: Oral (03/24 1937) BP: 168/51 mmHg (03/24 1937) Pulse Rate: 58 (03/24 1937)  Labs:  Recent Labs  01/06/14 1530  01/07/14 0354 01/08/14 0355 01/08/14 1345 01/08/14 2249  HGB 12.4*  --  11.1* 10.9*  --   --   HCT 36.4*  --  33.6* 32.6*  --   --   PLT 218  --  190 198  --   --   LABPROT 14.8  --  15.3* 14.4  --   --   INR 1.19  --  1.24 1.14  --   --   HEPARINUNFRC  --   < > 0.81* 0.23* 0.23* 0.36  CREATININE 1.71*  --  1.80* 1.71*  --   --   < > = values in this interval not displayed.  Estimated Creatinine Clearance: 47.5 ml/min (by C-G formula based on Cr of 1.71).   Assessment: 73 yo male s/p fem-pop bypass graft PTCA,  h/o Afib/AVR, for heparin  Goal of Therapy:  Heparin level 0.3-0.7 units/ml Monitor platelets by anticoagulation protocol: Yes   Plan:  Continue Heparin at current rate  Phillis Knack, PharmD, BCPS  01/08/2014,11:19 PM

## 2014-01-08 NOTE — Progress Notes (Addendum)
Subjective:  No chest pain. Leg feels much better s/p PCI  Objective:   Vital Signs in the last 24 hours: Temp:  [97.2 F (36.2 C)-98.4 F (36.9 C)] 97.7 F (36.5 C) (03/24 0803) Pulse Rate:  [52-58] 52 (03/24 0803) Resp:  [15-18] 16 (03/24 0803) BP: (135-159)/(43-80) 135/43 mmHg (03/24 0803) SpO2:  [94 %-97 %] 95 % (03/24 0803) Weight:  [233 lb 11 oz (106 kg)] 233 lb 11 oz (106 kg) (03/24 0026)  Intake/Output from previous day: 03/23 0701 - 03/24 0700 In: 875 [P.O.:480; I.V.:395] Out: 1000 [Urine:1000]  Medications: . aspirin EC  325 mg Oral Daily  . citalopram  40 mg Oral Daily  . vitamin B-12  1,500 mcg Oral Daily  . diltiazem  120 mg Oral Daily  . febuxostat  40 mg Oral Daily  . fenofibrate  160 mg Oral Daily  . ferrous sulfate  325 mg Oral Q breakfast  . isosorbide mononitrate  30 mg Oral Daily  . levothyroxine  50 mcg Oral QAC breakfast  . loratadine  10 mg Oral Daily  . pravastatin  40 mg Oral q1800  . tiotropium  18 mcg Inhalation Daily  . warfarin  10 mg Oral ONCE-1800  . Warfarin - Pharmacist Dosing Inpatient   Does not apply q1800    . heparin 1,400 Units/hr (01/08/14 0551)    Physical Exam:   General appearance: alert, cooperative and no distress Neck: no adenopathy, no JVD, supple, symmetrical, trachea midline and thyroid not enlarged, symmetric, no tenderness/mass/nodules Lungs: clear to auscultation bilaterally Heart: RRR 2/6 SEM crisp valve clicks Abdomen: soft, non-tender; bowel sounds normal; no masses,  no organomegaly Extremities: extremities normal, atraumatic, no cyanosis or edema Skin: Skin color, texture, turgor normal. No rashes or lesions RFA cath site stable, r leg normal temperature, no edema   Rate: 70  Rhythm:  Atrial fibrillation  Lab Results:    Recent Labs  01/07/14 0354 01/08/14 0355  NA 139 140  K 4.5 4.3  CL 103 103  CO2 25 24  GLUCOSE 111* 118*  BUN 23 24*  CREATININE 1.80* 1.71*   No results found for this  basename: TROPONINI, CK, MB,  in the last 72 hours Hepatic Function Panel No results found for this basename: PROT, ALBUMIN, AST, ALT, ALKPHOS, BILITOT, BILIDIR, IBILI,  in the last 72 hours  Recent Labs  01/08/14 0355  INR 1.14   BNP (last 3 results) No results found for this basename: PROBNP,  in the last 8760 hours  Lipid Panel     Component Value Date/Time   CHOL 162 11/15/2013 1207   TRIG 171* 11/15/2013 1207   HDL 35* 11/15/2013 1207   CHOLHDL 4.6 11/15/2013 1207   VLDL 34 11/15/2013 1207   [place] 93 11/15/2013 1207      Imaging:  X-ray Chest Pa And Lateral  01/07/2014   CLINICAL DATA:  Baseline for PV  angio  EXAM: CHEST  2 VIEW  COMPARISON:  12/04/2013  FINDINGS: Prior CABG. Mild cardiac enlargement. Resolution of bilateral pleural effusions and vascular congestion. No edema or effusion is seen on the current study. Aortic valve replacement.  IMPRESSION: Resolution of heart failure and bilateral effusions. Lungs are clear   Electronically Signed   By: Franchot Gallo M.D.   On: 01/07/2014 07:51      Assessment/Plan:   Active Problems:   PAD (peripheral artery disease),s/p aorto bifem BG, now with need for percutaneous transposition of R distal fem pop  Doing  well; s/p peripheral intervention to right fem pop bypass graft. On heparin, coumadin restarted last night with St Jude AVR. INR 1.14 today. Cr 1.71.    Troy Sine, MD, [facility] 01/08/2014, 10:18 AM

## 2014-01-09 LAB — PROTIME-INR
INR: 1.13 (ref 0.00–1.49)
Prothrombin Time: 14.3 seconds (ref 11.6–15.2)

## 2014-01-09 LAB — CBC
HEMATOCRIT: 32.8 % — AB (ref 39.0–52.0)
HEMOGLOBIN: 10.9 g/dL — AB (ref 13.0–17.0)
MCH: 30.8 pg (ref 26.0–34.0)
MCHC: 33.2 g/dL (ref 30.0–36.0)
MCV: 92.7 fL (ref 78.0–100.0)
Platelets: 196 10*3/uL (ref 150–400)
RBC: 3.54 MIL/uL — ABNORMAL LOW (ref 4.22–5.81)
RDW: 14.9 % (ref 11.5–15.5)
WBC: 6.6 10*3/uL (ref 4.0–10.5)

## 2014-01-09 LAB — HEPARIN LEVEL (UNFRACTIONATED): HEPARIN UNFRACTIONATED: 0.39 [IU]/mL (ref 0.30–0.70)

## 2014-01-09 MED ORDER — WARFARIN SODIUM 2.5 MG PO TABS
12.5000 mg | ORAL_TABLET | Freq: Once | ORAL | Status: AC
  Administered 2014-01-09: 19:00:00 12.5 mg via ORAL
  Filled 2014-01-09: qty 1

## 2014-01-09 NOTE — Progress Notes (Signed)
[place] for Heparin/Coumadin Indication: St Jude AVR, Afib  Allergies  Allergen Reactions  . Chantix [Varenicline] Nausea And Vomiting  . Lyrica [Pregabalin]     Swelling, couldn't breathe  . Nsaids Other (See Comments)    Unknown   . Simvastatin     Pt said he had a "bleed out" after taking it  . Ziac [Bisoprolol-Hydrochlorothiazide]     fatigue    Patient Measurements: Height: 5\' 9"  (175.3 cm) Weight: 235 lb 3.7 oz (106.7 kg) IBW/kg (Calculated) : 70.7  Vital Signs: Temp: 97.7 F (36.5 C) (03/25 0800) Temp src: Oral (03/25 0800) BP: 145/62 mmHg (03/25 0800) Pulse Rate: 53 (03/25 0800)  Labs:  Recent Labs  01/06/14 1530  01/07/14 0354 01/08/14 0355 01/08/14 1345 01/08/14 2249 01/09/14 0313  HGB 12.4*  --  11.1* 10.9*  --   --  10.9*  HCT 36.4*  --  33.6* 32.6*  --   --  32.8*  PLT 218  --  190 198  --   --  196  LABPROT 14.8  --  15.3* 14.4  --   --  14.3  INR 1.19  --  1.24 1.14  --   --  1.13  HEPARINUNFRC  --   < > 0.81* 0.23* 0.23* 0.36 0.39  CREATININE 1.71*  --  1.80* 1.71*  --   --   --   < > = values in this interval not displayed.  Estimated Creatinine Clearance: 47.7 ml/min (by C-G formula based on Cr of 1.71).   Assessment: 73 yo male s/p fem-pop bypass graft PTCA,  h/o Afib/AVR, on IV heparin bridge to coumadin. INR 1.13 remains subtherapeutic, hasn't improved despite dose increase. hepairn level 0.39, at goal. CBC stable, no bleeding noted per chart.  PTA Coumadin dose: 7.5 mg Monday Wednesday Friday and 5 mg Tuesday Thursday Saturday and Sunday, last dose was on 3/17  Goal of Therapy:  INR 2.5-3.5 Heparin level 0.3-0.7 units/ml Monitor platelets by anticoagulation protocol: Yes   Plan:  Continue Heparin 1550 units/hr Coumadin 12.5 mg po x 1. F/u daily heparin level, CBC and INR  Maryanna Shape, PharmD, BCPS  Clinical Pharmacist  Pager: [phone]  01/09/2014,8:28 AM

## 2014-01-09 NOTE — Progress Notes (Signed)
     SUBJECTIVE: No complaints this am.   BP 145/62  Pulse 53  Temp(Src) 97.7 F (36.5 C) (Oral)  Resp 18  Ht 5\' 9"  (1.753 m)  Wt 235 lb 3.7 oz (106.7 kg)  BMI 34.72 kg/m2  SpO2 98%  Intake/Output Summary (Last 24 hours) at 01/09/14 0847 Last data filed at 01/09/14 6301  Gross per 24 hour  Intake 1681.21 ml  Output   2100 ml  Net -418.79 ml    PHYSICAL EXAM General: Well developed, well nourished, in no acute distress. Alert and oriented x 3.  Psych:  Good affect, responds appropriately Neck: No JVD. No masses noted.  Lungs: Clear bilaterally with no wheezes or rhonci noted.  Heart: RRR with no murmurs noted. Abdomen: Bowel sounds are present. Soft, non-tender.  Extremities: No lower extremity edema.   LABS: Basic Metabolic Panel:  Recent Labs  01/07/14 0354 01/08/14 0355  NA 139 140  K 4.5 4.3  CL 103 103  CO2 25 24  GLUCOSE 111* 118*  BUN 23 24*  CREATININE 1.80* 1.71*  CALCIUM 8.8 8.8   CBC:  Recent Labs  01/08/14 0355 01/09/14 0313  WBC 5.9 6.6  HGB 10.9* 10.9*  HCT 32.6* 32.8*  MCV 92.9 92.7  PLT 198 196   Current Meds: . aspirin EC  325 mg Oral Daily  . citalopram  40 mg Oral Daily  . vitamin B-12  1,500 mcg Oral Daily  . diltiazem  120 mg Oral Daily  . febuxostat  40 mg Oral Daily  . fenofibrate  160 mg Oral Daily  . ferrous sulfate  325 mg Oral Q breakfast  . isosorbide mononitrate  30 mg Oral Daily  . levothyroxine  50 mcg Oral QAC breakfast  . loratadine  10 mg Oral Daily  . pravastatin  40 mg Oral q1800  . tiotropium  18 mcg Inhalation Daily  . warfarin  12.5 mg Oral ONCE-1800  . Warfarin - Pharmacist Dosing Inpatient   Does not apply q1800     ASSESSMENT AND PLAN: 73 y/o male with h/o CAD, s/p CABG, stage III CKD and PAD with decreased ABIs and lifestyle limiting claudication, who was admitted 01/06/14 for pre cath hydration, prior to undergoing a PV Angiogram. PV procedure 01/07/14 per Dr. Gwenlyn Found with balloon angioplasty of  high-grade distal right femoropopliteal bypass graft stenosis just above the distal anastomosis using a 7 mm balloon with reduction of a 90% stenosis to less than 30%. He has done well.   1. PAD: stable. On ASA and coumadin  2. St. Jude mechanical AVR: He is on heparin and has been restarted on coumadin. Per Dr. Gwenlyn Found, plans to d/c home when INR is over 2.0. His INR this am is 1.1. He will be here for the next few days.     Robert Stanton  3/25/20158:47 AM

## 2014-01-10 ENCOUNTER — Encounter (HOSPITAL_COMMUNITY): Payer: Self-pay | Admitting: Physician Assistant

## 2014-01-10 LAB — BASIC METABOLIC PANEL
BUN: 23 mg/dL (ref 6–23)
CO2: 25 mEq/L (ref 19–32)
Calcium: 8.9 mg/dL (ref 8.4–10.5)
Chloride: 101 mEq/L (ref 96–112)
Creatinine, Ser: 1.81 mg/dL — ABNORMAL HIGH (ref 0.50–1.35)
GFR, EST AFRICAN AMERICAN: 42 mL/min — AB (ref >90)
GFR, EST NON AFRICAN AMERICAN: 36 mL/min — AB (ref >90)
Glucose, Bld: 114 mg/dL — ABNORMAL HIGH (ref 70–99)
POTASSIUM: 4.7 meq/L (ref 3.7–5.3)
SODIUM: 137 meq/L (ref 137–147)

## 2014-01-10 LAB — CBC
HCT: 32.7 % — ABNORMAL LOW (ref 39.0–52.0)
HEMOGLOBIN: 11 g/dL — AB (ref 13.0–17.0)
MCH: 31.1 pg (ref 26.0–34.0)
MCHC: 33.6 g/dL (ref 30.0–36.0)
MCV: 92.4 fL (ref 78.0–100.0)
Platelets: 192 10*3/uL (ref 150–400)
RBC: 3.54 MIL/uL — AB (ref 4.22–5.81)
RDW: 15.1 % (ref 11.5–15.5)
WBC: 8.1 10*3/uL (ref 4.0–10.5)

## 2014-01-10 LAB — HEPARIN LEVEL (UNFRACTIONATED): Heparin Unfractionated: 0.36 IU/mL (ref 0.30–0.70)

## 2014-01-10 LAB — PROTIME-INR
INR: 1.59 — AB (ref 0.00–1.49)
PROTHROMBIN TIME: 18.5 s — AB (ref 11.6–15.2)

## 2014-01-10 MED ORDER — WARFARIN SODIUM 7.5 MG PO TABS
7.5000 mg | ORAL_TABLET | Freq: Once | ORAL | Status: AC
  Administered 2014-01-10: 7.5 mg via ORAL
  Filled 2014-01-10: qty 1

## 2014-01-10 NOTE — Progress Notes (Signed)
Home cpap off upon arrival to room, no c/o, RT to monitor.

## 2014-01-10 NOTE — Discharge Instructions (Addendum)
Information on my medicine - Coumadin   (Warfarin)  This medication education was reviewed with me or my healthcare representative as part of my discharge preparation.  The pharmacist that spoke with me during my hospital stay was:   Why was Coumadin prescribed for you? Coumadin was prescribed for you because you have a blood clot or a medical condition that can cause an increased risk of forming blood clots. Blood clots can cause serious health problems by blocking the flow of blood to the heart, lung, or brain. Coumadin can prevent harmful blood clots from forming. As a reminder your indication for Coumadin is:   Blood Clot Prevention After Heart Valve Surgery  What test will check on my response to Coumadin? While on Coumadin (warfarin) you will need to have an INR test regularly to ensure that your dose is keeping you in the desired range. The INR (international normalized ratio) number is calculated from the result of the laboratory test called prothrombin time (PT).  If an INR APPOINTMENT HAS NOT ALREADY BEEN MADE FOR YOU please schedule an appointment to have this lab work done by your health care provider within 7 days. Your INR goal is usually a number between:  2.5 to 3.5 or your provider may give you a lower range like 2-3.  Ask your health care provider during an office visit what your goal INR is.  What  do you need to  know  About  COUMADIN? Take Coumadin (warfarin) exactly as prescribed by your healthcare provider about the same time each day.  DO NOT stop taking without talking to the doctor who prescribed the medication.  Stopping without other blood clot prevention medication to take the place of Coumadin may increase your risk of developing a new clot or stroke.  Get refills before you run out.  What do you do if you miss a dose? If you miss a dose, take it as soon as you remember on the same day then continue your regularly scheduled regimen the next day.  Do not take two doses  of Coumadin at the same time.  Important Safety Information A possible side effect of Coumadin (Warfarin) is an increased risk of bleeding. You should call your healthcare provider right away if you experience any of the following:   Bleeding from an injury or your nose that does not stop.   Unusual colored urine (red or dark brown) or unusual colored stools (red or black).   Unusual bruising for unknown reasons.   A serious fall or if you hit your head (even if there is no bleeding).  Some foods or medicines interact with Coumadin (warfarin) and might alter your response to warfarin. To help avoid this:   Eat a balanced diet, maintaining a consistent amount of Vitamin K.   Notify your provider about major diet changes you plan to make.   Avoid alcohol or limit your intake to 1 drink for women and 2 drinks for men per day. (1 drink is 5 oz. wine, 12 oz. beer, or 1.5 oz. liquor.)  Make sure that ANY health care provider who prescribes medication for you knows that you are taking Coumadin (warfarin).  Also make sure the healthcare provider who is monitoring your Coumadin knows when you have started a new medication including herbals and non-prescription products.  Coumadin (Warfarin)  Major Drug Interactions  Increased Warfarin Effect Decreased Warfarin Effect  Alcohol (large quantities) Antibiotics (esp. Septra/Bactrim, Flagyl, Cipro) Amiodarone (Cordarone) Aspirin (ASA) Cimetidine (Tagamet) Megestrol (  Propafenone (Rythmol SR) °Propranolol (Inderal) °Isoniazid (INH) °Posaconazole (Noxafil) Barbiturates (Phenobarbital) °Carbamazepine (Tegretol) °Chlordiazepoxide (Librium) °Cholestyramine (Questran) °Griseofulvin °Oral Contraceptives °Rifampin °Sucralfate (Carafate) °Vitamin K  ° °Coumadin® (Warfarin) Major Herbal Interactions  °Increased Warfarin Effect Decreased Warfarin Effect  °Garlic °Ginseng °Ginkgo biloba Coenzyme  Q10 °Green tea °St. John’s wort   ° °Coumadin® (Warfarin) FOOD Interactions  °Eat a consistent number of servings per week of foods HIGH in Vitamin K °(1 serving = ½ cup)  °Collards (cooked, or boiled & drained) °Kale (cooked, or boiled & drained) °Mustard greens (cooked, or boiled & drained) °Parsley *serving size only = ¼ cup °Spinach (cooked, or boiled & drained) °Swiss chard (cooked, or boiled & drained) °Turnip greens (cooked, or boiled & drained)  °Eat a consistent number of servings per week of foods MEDIUM-HIGH in Vitamin K °(1 serving = 1 cup)  °Asparagus (cooked, or boiled & drained) °Broccoli (cooked, boiled & drained, or raw & chopped) °Brussel sprouts (cooked, or boiled & drained) *serving size only = ½ cup °Lettuce, raw (green leaf, endive, romaine) °Spinach, raw °Turnip greens, raw & chopped  ° °These websites have more information on Coumadin (warfarin):  www.coumadin.com; °www.ahrq.gov/consumer/coumadin.htm; ° °. °

## 2014-01-10 NOTE — Care Management Note (Signed)
    Page 1 of 1   01/10/2014     10:25:21 AM   CARE MANAGEMENT NOTE 01/10/2014  Patient:  Robert Stanton, Robert Stanton   Account Number:  [id number]  Date Initiated:  01/10/2014  Documentation initiated by:  [facility]  Subjective/Objective Assessment:   73 y/o male with h/o CAD, s/p CABG, stage III CKD and PAD with decreased ABIs and lifestyle limiting claudication, who has been admitted for pre cath hydration, prior to undergoing a PV Angiogram. //Home with spouse.     Action/Plan:   PTCA of high-grade distal right femoropopliteal bypass graft stenosis just above the distal anastomosis using a 7 mm balloon with reduction of a 90% stenosis to less than 30%.//Access for Home Health needs   Anticipated DC Date:  01/11/2014   Anticipated DC Plan:  HOME/SELF CARE         Choice offered to / List presented to:             Status of service:  In process, will continue to follow Medicare Important Message given?   (If response is "NO", the following Medicare IM given date fields will be blank) Date Medicare IM given:   Date Additional Medicare IM given:    Discharge Disposition:    Per UR Regulation:    If discussed at Long Length of Stay Meetings, dates discussed:   01/10/2014    Comments:  01/10/14 Fuller Mandril, RN, BSN, NCM St. Jude mechanical AVR: He is on heparin and has been restarted on coumadin. Per Dr. Gwenlyn Found, plans to d/c home when INR is over 2.0. His INR this am is 1.59. Probable d/c home in am.

## 2014-01-10 NOTE — Progress Notes (Signed)
[place] for Heparin/Coumadin Indication: St Jude AVR, Afib  Allergies  Allergen Reactions  . Chantix [Varenicline] Nausea And Vomiting  . Lyrica [Pregabalin]     Swelling, couldn't breathe  . Nsaids Other (See Comments)    Unknown   . Simvastatin     Pt said he had a "bleed out" after taking it  . Ziac [Bisoprolol-Hydrochlorothiazide]     fatigue    Patient Measurements: Height: 5\' 9"  (175.3 cm) Weight: 236 lb 15.9 oz (107.5 kg) IBW/kg (Calculated) : 70.7  Vital Signs: Temp: 97.4 F (36.3 C) (03/26 0726) Temp src: Oral (03/26 0726) BP: 135/59 mmHg (03/26 0726) Pulse Rate: 60 (03/26 0726)  Labs:  Recent Labs  01/08/14 0355  01/08/14 2249 01/09/14 0313 01/10/14 0435  HGB 10.9*  --   --  10.9* 11.0*  HCT 32.6*  --   --  32.8* 32.7*  PLT 198  --   --  196 192  LABPROT 14.4  --   --  14.3 18.5*  INR 1.14  --   --  1.13 1.59*  HEPARINUNFRC 0.23*  < > 0.36 0.39 0.36  CREATININE 1.71*  --   --   --  1.81*  < > = values in this interval not displayed.  Estimated Creatinine Clearance: 45.2 ml/min (by C-G formula based on Cr of 1.81).   Assessment: 73 yo male s/p fem-pop bypass graft PTCA,  h/o Afib/AVR, on IV heparin bridge to coumadin. INR 1.13 > 1.59, remains subtherapeuticm but trending up nicely, received higher than home dose the past 2 days. hepairn level 0.36, at goal. CBC stable, no bleeding noted per chart. Plan for discharge after INR > 2  PTA Coumadin dose: 7.5 mg Monday Wednesday Friday and 5 mg Tuesday Thursday Saturday and Sunday, last dose was on 3/17  Goal of Therapy:  INR 2.5-3.5 Heparin level 0.3-0.7 units/ml Monitor platelets by anticoagulation protocol: Yes   Plan:  Continue Heparin 1550 units/hr Coumadin 7.5 mg po x 1. F/u daily heparin level, CBC and INR  Maryanna Shape, PharmD, BCPS  Clinical Pharmacist  Pager: [phone]  01/10/2014,12:43 PM

## 2014-01-10 NOTE — Progress Notes (Addendum)
Patient Name: Robert Stanton Date of Encounter: 01/10/2014  SUBJECTIVE:  No chest pain or SOB.   OBJECTIVE Filed Vitals:   01/09/14 2358 01/10/14 0540 01/10/14 0726 01/10/14 0846  BP: 137/60 141/60 135/59   Pulse: 49 53 60   Temp: 97.7 F (36.5 C) 97.5 F (36.4 C) 97.4 F (36.3 C)   TempSrc: Axillary Axillary Oral   Resp: 18 20 18    Height:      Weight: 236 lb 15.9 oz (107.5 kg)     SpO2: 97% 98% 98% 92%    Intake/Output Summary (Last 24 hours) at 01/10/14 0914 Last data filed at 01/10/14 5462  Gross per 24 hour  Intake   1332 ml  Output   2050 ml  Net   -718 ml   Filed Weights   01/08/14 0026 01/09/14 0005 01/09/14 2358  Weight: 233 lb 11 oz (106 kg) 235 lb 3.7 oz (106.7 kg) 236 lb 15.9 oz (107.5 kg)    PHYSICAL EXAM General: Well developed, well nourished, male in no acute distress. Head: Normocephalic, atraumatic.  Neck: Supple without bruits, JVD. Lungs:  Resp regular and unlabored, CTA. Heart: RRR, S1, S2, no S3, S4, or murmur; no rub. Abdomen: Soft, non-tender, non-distended, BS + x 4.  Extremities: No clubbing, cyanosis, edema.  Neuro: Alert and oriented X 3. Moves all extremities spontaneously. Psych: Normal affect.  LABS: CBC:  Recent Labs  01/09/14 0313 01/10/14 0435  WBC 6.6 8.1  HGB 10.9* 11.0*  HCT 32.8* 32.7*  MCV 92.7 92.4  PLT 196 192   INR:  Recent Labs  01/10/14 0435  INR 7.03*   Basic Metabolic Panel:  Recent Labs  01/08/14 0355 01/10/14 0435  NA 140 137  K 4.3 4.7  CL 103 101  CO2 24 25  GLUCOSE 118* 114*  BUN 24* 23  CREATININE 1.71* 1.81*  CALCIUM 8.8 8.9   INR: 1.59  TELE:     Current Medications:  . aspirin EC  325 mg Oral Daily  . citalopram  40 mg Oral Daily  . vitamin B-12  1,500 mcg Oral Daily  . diltiazem  120 mg Oral Daily  . febuxostat  40 mg Oral Daily  . fenofibrate  160 mg Oral Daily  . ferrous sulfate  325 mg Oral Q breakfast  . isosorbide mononitrate  30 mg Oral Daily  .  levothyroxine  50 mcg Oral QAC breakfast  . loratadine  10 mg Oral Daily  . pravastatin  40 mg Oral q1800  . tiotropium  18 mcg Inhalation Daily  . Warfarin - Pharmacist Dosing Inpatient   Does not apply q1800   . sodium chloride 10 mL/hr at 01/10/14 0600  . heparin 1,550 Units/hr (01/10/14 0600)    ASSESSMENT AND PLAN: 73 y/o male with h/o CAD, s/p CABG, stage III CKD and PAD with decreased ABIs and lifestyle limiting claudication, who was admitted 01/06/14 for pre cath hydration, prior to undergoing a PV Angiogram. PV procedure 01/07/14 per Dr. Gwenlyn Found with balloon angioplasty of high-grade distal right femoropopliteal bypass graft stenosis just above the distal anastomosis using a 7 mm balloon with reduction of a 90% stenosis to less than 30%. He has done well.   1. PAD: stable. On ASA and coumadin   2. St. Jude mechanical AVR: He is on heparin and has been restarted on coumadin. Per Dr. Gwenlyn Found, plans to d/c home when INR is over 2.0. His INR this am is 1.59. Probable d/c home  in am.   3. PAF: He is being anticoagulated. Rate controlled.

## 2014-01-10 NOTE — Progress Notes (Signed)
Pt called staff claimed bleeding on rt groin site. Assisted back to bed ,moderate amount of blood noted , with small hematoma noted. 10 min. Pressure hold done, hematoma resolved. DSD applied. Kept pt monitored.

## 2014-01-10 NOTE — Progress Notes (Signed)
Patient placed himself on home CPAP unit for the night. Sp02 level at this time is 96%,will continue to monitor patient

## 2014-01-11 ENCOUNTER — Encounter (HOSPITAL_COMMUNITY): Payer: Self-pay | Admitting: Nurse Practitioner

## 2014-01-11 ENCOUNTER — Other Ambulatory Visit: Payer: Self-pay | Admitting: Nurse Practitioner

## 2014-01-11 ENCOUNTER — Telehealth (HOSPITAL_COMMUNITY): Payer: Self-pay | Admitting: *Deleted

## 2014-01-11 DIAGNOSIS — I739 Peripheral vascular disease, unspecified: Secondary | ICD-10-CM

## 2014-01-11 LAB — CBC
HCT: 32.9 % — ABNORMAL LOW (ref 39.0–52.0)
HEMOGLOBIN: 11 g/dL — AB (ref 13.0–17.0)
MCH: 30.9 pg (ref 26.0–34.0)
MCHC: 33.4 g/dL (ref 30.0–36.0)
MCV: 92.4 fL (ref 78.0–100.0)
Platelets: 194 10*3/uL (ref 150–400)
RBC: 3.56 MIL/uL — ABNORMAL LOW (ref 4.22–5.81)
RDW: 15.2 % (ref 11.5–15.5)
WBC: 6.6 10*3/uL (ref 4.0–10.5)

## 2014-01-11 LAB — PROTIME-INR
INR: 2.03 — AB (ref 0.00–1.49)
PROTHROMBIN TIME: 22.3 s — AB (ref 11.6–15.2)

## 2014-01-11 LAB — HEPARIN LEVEL (UNFRACTIONATED): Heparin Unfractionated: 0.17 IU/mL — ABNORMAL LOW (ref 0.30–0.70)

## 2014-01-11 NOTE — Progress Notes (Signed)
     SUBJECTIVE: No chest pain or SOB. He did have a small hematoma yesterday in right groin. No bleeding or hematoma this am  BP 133/58  Pulse 49  Temp(Src) 97.7 F (36.5 C) (Axillary)  Resp 17  Ht 5\' 9"  (1.753 m)  Wt 237 lb 14 oz (107.9 kg)  BMI 35.11 kg/m2  SpO2 97%  Intake/Output Summary (Last 24 hours) at 01/11/14 4315 Last data filed at 01/11/14 0700  Gross per 24 hour  Intake   1360 ml  Output   1600 ml  Net   -240 ml    PHYSICAL EXAM General: Well developed, well nourished, in no acute distress. Alert and oriented x 3.  Psych:  Good affect, responds appropriately Neck: No JVD. No masses noted.  Lungs: Clear bilaterally with no wheezes or rhonci noted.  Heart: RRR with no murmurs noted. Abdomen: Bowel sounds are present. Soft, non-tender.  Extremities: No lower extremity edema. Right groin stable.   LABS: Basic Metabolic Panel:  Recent Labs  01/10/14 0435  NA 137  K 4.7  CL 101  CO2 25  GLUCOSE 114*  BUN 23  CREATININE 1.81*  CALCIUM 8.9   CBC:  Recent Labs  01/10/14 0435 01/11/14 0406  WBC 8.1 6.6  HGB 11.0* 11.0*  HCT 32.7* 32.9*  MCV 92.4 92.4  PLT 192 194   Current Meds: . aspirin EC  325 mg Oral Daily  . citalopram  40 mg Oral Daily  . vitamin B-12  1,500 mcg Oral Daily  . diltiazem  120 mg Oral Daily  . febuxostat  40 mg Oral Daily  . fenofibrate  160 mg Oral Daily  . ferrous sulfate  325 mg Oral Q breakfast  . isosorbide mononitrate  30 mg Oral Daily  . levothyroxine  50 mcg Oral QAC breakfast  . loratadine  10 mg Oral Daily  . pravastatin  40 mg Oral q1800  . tiotropium  18 mcg Inhalation Daily  . Warfarin - Pharmacist Dosing Inpatient   Does not apply q1800     ASSESSMENT AND PLAN: 73 y/o male with h/o CAD, s/p CABG, stage III CKD and PAD with decreased ABIs and lifestyle limiting claudication, who was admitted 01/06/14 for pre cath hydration, prior to undergoing a PV Angiogram. PV procedure 01/07/14 per Dr. Gwenlyn Found with balloon  angioplasty of high-grade distal right femoropopliteal bypass graft stenosis just above the distal anastomosis using a 7 mm balloon with reduction of a 90% stenosis to less than 30%. He has done well.   1. PAD: stable. On ASA and coumadin   2. St. Jude mechanical AVR: INR is therapeutic this am.    3. PAF: He is being anticoagulated. Rate controlled.   Discharge home. Follow up 2-3 weeks Dr. Gwenlyn Found. INR check next week.    MCALHANY,CHRISTOPHER  3/27/20157:27 AM

## 2014-01-11 NOTE — Progress Notes (Signed)
ANTICOAGULATION CONSULT NOTE - Follow Up Consult  Pharmacy Consult for heparin Indication: Afib/AVR  Labs:  Recent Labs  01/09/14 0313 01/10/14 0435 01/11/14 0406  HGB 10.9* 11.0* 11.0*  HCT 32.8* 32.7* 32.9*  PLT 196 192 194  LABPROT 14.3 18.5* 22.3*  INR 1.13 1.59* 2.03*  HEPARINUNFRC 0.39 0.36 0.17*  CREATININE  --  1.81*  --     Assessment: 73yo male now subtherapeutic on heparin after a few levels at low end of goal; no gtt issues/interruptions per RN.  Goal of Therapy:  Heparin level 0.3-0.7 units/ml   Plan:  Will increase heparin gtt by 2-3 units/kg/hr to 1800 units/hr and check level in [place], PharmD, BCPS  01/11/2014,5:49 AM

## 2014-01-11 NOTE — Discharge Summary (Signed)
See full note this am. cdm 

## 2014-01-11 NOTE — Discharge Summary (Signed)
Discharge Summary   Patient ID: Robert Stanton,  MRN: [id number], DOB/AGE: 1941-09-28 73 y.o.  Admit date: 01/06/2014 Discharge date: 01/11/2014  Primary Care Provider: MCKEOWN,WILLIAM DAVID Primary Cardiologist: Adora Fridge, MD   Discharge Diagnoses Principal Problem:   PAD (peripheral artery disease),s/p aorto bifem BG, now with need for percutaneous transposition of R distal fem pop Active Problems:   HYPERTENSION   AORTIC VALVE DISORDERS, hx Mechanical AVR St Jude   Permanent atrial fibrillation   CKD (chronic kidney disease) stage 3, GFR 30-59 ml/min   Chronic anticoagulation  Allergies Allergies  Allergen Reactions  . Chantix [Varenicline] Nausea And Vomiting  . Lyrica [Pregabalin]     Swelling, couldn't breathe  . Nsaids Other (See Comments)    Unknown   . Simvastatin     Pt said he had a "bleed out" after taking it  . Ziac [Bisoprolol-Hydrochlorothiazide]     fatigue   Procedures  Peripheral Angiography with Percutaneous Transluminal Angioplasty 3.23.2015  HEMODYNAMICS:     AO SYSTOLIC/AO DIASTOLIC: 308/65    IMPRESSION:1 successful PTCA of high-grade distal right femoropopliteal bypass graft stenosis just above the distal anastomosis using a 7 mm balloon with reduction of a 90% stenosis to less than 30%. Because the patient has a mechanical aortic valve I'm going to restart the heparin in 6 hours without bolus and will restart Coumadin tonight as well. With the patient's INR is therapeutic he'll be discharged home I will obtain followup lower extremity arterial Doppler studies in her [facility] office. _____________   History of Present Illness  73 year old male with a prior history of coronary artery disease status post coronary artery bypass grafting, mechanical aortic valve replacement on chronic Coumadin, peripheral arterial disease status post right to left femoral to femoral crossover grafting as well as bilateral femoral-popliteal grafting. He recently  underwent arterial brachial indices testing showing an ABI of 0.64 on the right. Peripheral angiography was performed on 12/03/2013 revealing distal right femoral to femoral graft stenosis. Patient was seen back in the office arrangements were made for percutaneous intervention upon this graft stenosis. He was placed on Lovenox and Coumadin therapy was held.  Hospital Course  Patient presented to [facility] on March 22, for IV hydration in preparation for peripheral angiography. On March 23, patient was taken to the peripheral angiography laboratory and underwent successful percutaneous transluminal angioplasty of the distal stenosis in the femoral to femoral bypass graft with reduction in stenosis from 90% to less than 30%. Patient tolerated procedure well and post procedure was placed back on heparin and Coumadin therapy. Creatinine remained stable post angiography and he was kept in the hospital on heparin bridging until INR is therapeutic. Patient has been ambulating without difficulty. INR this morning is 2.03. He will be discharged home today in good condition. We have arranged for Coumadin clinic and office follow week and we will also contact patient to arrange for followup right lower extremity arterial duplex.  Discharge Vitals Blood pressure 133/58, pulse 49, temperature 97.7 F (36.5 C), temperature source Axillary, resp. rate 17, height 5\' 9"  (1.753 m), weight 237 lb 14 oz (107.9 kg), SpO2 97.00%.  Filed Weights   01/09/14 0005 01/09/14 2358 01/11/14 0023  Weight: 235 lb 3.7 oz (106.7 kg) 236 lb 15.9 oz (107.5 kg) 237 lb 14 oz (107.9 kg)   Labs  CBC  Recent Labs  01/10/14 0435 01/11/14 0406  WBC 8.1 6.6  HGB 11.0* 11.0*  HCT 32.7* 32.9*  MCV 92.4 92.4  PLT 192  409   Basic Metabolic Panel  Recent Labs  01/10/14 0435  NA 137  K 4.7  CL 101  CO2 25  GLUCOSE 114*  BUN 23  CREATININE 1.81*  CALCIUM 8.9   Lab Results  Component Value Date   INR 2.03* 01/11/2014   INR  1.59* 01/10/2014   INR 1.13 01/09/2014    Disposition  Pt is being discharged home today in good condition.  Follow-up Plans & Appointments  Follow-up Information   Follow up with Erlene Quan, PA-C On 01/16/2014. (11:20 AM)    Specialty:  Cardiology   Contact information:   [address] [place] [address] [phone]       Follow up with Tommy Medal, [place] On 01/16/2014. (12:10 PM - coumadin clinic)    Contact information:   [address] [place] [address] [phone]      Discharge Medications    Medication List    STOP taking these medications       enoxaparin 100 MG/ML injection  Commonly known as:  LOVENOX      TAKE these medications       albuterol (2.5 MG/3ML) 0.083% nebulizer solution  Commonly known as:  PROVENTIL  Take 2.5 mg by nebulization every 6 (six) hours as needed for wheezing or shortness of breath.     aspirin EC 325 MG tablet  Take 325 mg by mouth daily.     bumetanide 2 MG tablet  Commonly known as:  BUMEX  Take 2 mg by mouth 2 (two) times daily.     cetirizine 10 MG tablet  Commonly known as:  ZYRTEC  Take 10 mg by mouth daily.     citalopram 40 MG tablet  Commonly known as:  CELEXA  Take 40 mg by mouth daily.     clonazePAM 1 MG tablet  Commonly known as:  KLONOPIN  Take 1 mg by mouth 3 (three) times daily as needed for anxiety.     diltiazem 120 MG 24 hr capsule  Commonly known as:  TIAZAC  Take 120 mg by mouth daily.     febuxostat 40 MG tablet  Commonly known as:  ULORIC  Take 40 mg by mouth daily. For Gout     fenofibrate micronized 134 MG capsule  Commonly known as:  LOFIBRA  Take 134 mg by mouth daily before breakfast.     ferrous sulfate 325 (65 FE) MG tablet  Take 325 mg by mouth daily.     ipratropium-albuterol 0.5-2.5 (3) MG/3ML Soln  Commonly known as:  DUONEB  Inhale 3 mLs into the lungs every 6 (six) hours as needed.     isosorbide mononitrate 30 MG 24 hr tablet   Commonly known as:  IMDUR  Take 30 mg by mouth daily.     levothyroxine 50 MCG tablet  Commonly known as:  SYNTHROID, LEVOTHROID  Take 50 mcg by mouth daily before breakfast.     nitroGLYCERIN 0.4 MG/SPRAY spray  Commonly known as:  NITROLINGUAL  Place 1 spray under the tongue every 5 (five) minutes x 3 doses as needed for chest pain.     pravastatin 40 MG tablet  Commonly known as:  PRAVACHOL  Take 40 mg by mouth every morning.     tiotropium 18 MCG inhalation capsule  Commonly known as:  SPIRIVA  Place 18 mcg into inhaler and inhale daily as needed (for shortness of breath).     VITAMIN B 12 PO  Take 1,500  mcg by mouth daily.     VITAMIN D PO  Take 5,000 Units by mouth daily.     warfarin 5 MG tablet  Commonly known as:  COUMADIN  Take 5-7.5 mg by mouth daily. 7.5 mg Monday Wednesday Friday and 5 mg Tuesday Thursday Saturday and Sunday       Outstanding Labs/Studies  Follow-up INR on 4/1. Follow-up right lower extremity arterial duplex - to be arranged.  Duration of Discharge Encounter   Greater than 30 minutes including physician time.  Signed, Murray Hodgkins NP 01/11/2014, 8:59 AM

## 2014-01-16 ENCOUNTER — Ambulatory Visit (INDEPENDENT_AMBULATORY_CARE_PROVIDER_SITE_OTHER): Payer: Medicare Other | Admitting: Pharmacist Clinician (PhC)/ Clinical Pharmacy Specialist

## 2014-01-16 ENCOUNTER — Encounter: Payer: Self-pay | Admitting: Cardiology

## 2014-01-16 ENCOUNTER — Ambulatory Visit (INDEPENDENT_AMBULATORY_CARE_PROVIDER_SITE_OTHER): Payer: Medicare Other | Admitting: Cardiology

## 2014-01-16 VITALS — BP 140/60 | HR 60 | Ht 69.0 in | Wt 229.0 lb

## 2014-01-16 DIAGNOSIS — I739 Peripheral vascular disease, unspecified: Secondary | ICD-10-CM

## 2014-01-16 DIAGNOSIS — Z7901 Long term (current) use of anticoagulants: Secondary | ICD-10-CM

## 2014-01-16 DIAGNOSIS — I359 Nonrheumatic aortic valve disorder, unspecified: Secondary | ICD-10-CM

## 2014-01-16 DIAGNOSIS — I4891 Unspecified atrial fibrillation: Secondary | ICD-10-CM

## 2014-01-16 DIAGNOSIS — Z954 Presence of other heart-valve replacement: Secondary | ICD-10-CM

## 2014-01-16 DIAGNOSIS — N183 Chronic kidney disease, stage 3 unspecified: Secondary | ICD-10-CM

## 2014-01-16 DIAGNOSIS — I2510 Atherosclerotic heart disease of native coronary artery without angina pectoris: Secondary | ICD-10-CM

## 2014-01-16 DIAGNOSIS — I4821 Permanent atrial fibrillation: Secondary | ICD-10-CM

## 2014-01-16 DIAGNOSIS — Z952 Presence of prosthetic heart valve: Secondary | ICD-10-CM

## 2014-01-16 LAB — POCT INR: INR: 3.5

## 2014-01-16 NOTE — Patient Instructions (Signed)
Your physician recommends that you schedule a follow-up appointment in: 3 months with Dr Berry 

## 2014-01-16 NOTE — Progress Notes (Signed)
01/16/2014 Robert Stanton   1941-03-06  [id number]  Primary Physicia MCKEOWN,WILLIAM DAVID, MD Primary Cardiologist: Dr Gwenlyn Found  HPI:  73 year old male with a prior history of coronary artery disease status post coronary artery bypass grafting, mechanical aortic valve replacement on chronic Coumadin, peripheral arterial disease status post right to left femoral to femoral crossover grafting as well as bilateral femoral-popliteal grafting. He recently underwent arterial brachial indices testing showing an ABI of 0.64 on the right. Peripheral angiography was performed on 12/03/2013 revealing distal right femoral to femoral graft stenosis.   Patient presented to [facility] on March 22, for IV hydration in preparation for peripheral angiography. On March 23, patient was taken to the peripheral angiography laboratory and underwent successful percutaneous transluminal angioplasty of the distal stenosis in the femoral to femoral bypass graft with reduction in stenosis from 90% to less than 30%. Patient tolerated procedure well and post procedure was placed back on heparin and Coumadin therapy. Creatinine remained stable post angiography and he was kept in the hospital on heparin bridging until INR is therapeutic. He is seen in the office today in follow up. He is doing well. Dopplers are scheduled for Monday. He will see Dr Gwenlyn Found if follow up.     Current Outpatient Prescriptions  Medication Sig Dispense Refill  . albuterol (PROVENTIL) (2.5 MG/3ML) 0.083% nebulizer solution Take 2.5 mg by nebulization every 6 (six) hours as needed for wheezing or shortness of breath.      [name] aspirin EC 325 MG tablet Take 325 mg by mouth daily.      . bumetanide (BUMEX) 2 MG tablet Take 2 mg by mouth 2 (two) times daily.      . cetirizine (ZYRTEC) 10 MG tablet Take 10 mg by mouth daily.      . Cholecalciferol (VITAMIN D PO) Take 5,000 Units by mouth daily.       . citalopram (CELEXA) 40 MG tablet Take 40 mg by mouth  daily.       . clonazePAM (KLONOPIN) 1 MG tablet Take 1 mg by mouth 3 (three) times daily as needed for anxiety.       . Cyanocobalamin (VITAMIN B 12 PO) Take 1,500 mcg by mouth daily.       [name] diltiazem (TIAZAC) 120 MG 24 hr capsule Take 120 mg by mouth daily.       . febuxostat (ULORIC) 40 MG tablet Take 40 mg by mouth daily. For Gout      . fenofibrate micronized (LOFIBRA) 134 MG capsule Take 134 mg by mouth daily before breakfast.       . ferrous sulfate 325 (65 FE) MG tablet Take 325 mg by mouth daily.      [name] ipratropium-albuterol (DUONEB) 0.5-2.5 (3) MG/3ML SOLN Inhale 3 mLs into the lungs every 6 (six) hours as needed.       . isosorbide mononitrate (IMDUR) 30 MG 24 hr tablet Take 30 mg by mouth daily.       [name] levothyroxine (SYNTHROID, LEVOTHROID) 50 MCG tablet Take 50 mcg by mouth daily before breakfast.      . nitroGLYCERIN (NITROLINGUAL) 0.4 MG/SPRAY spray Place 1 spray under the tongue every 5 (five) minutes x 3 doses as needed for chest pain.      . pravastatin (PRAVACHOL) 40 MG tablet Take 40 mg by mouth every morning.       . tiotropium (SPIRIVA) 18 MCG inhalation capsule Place 18 mcg into inhaler and inhale daily as needed (for shortness  of breath).       . warfarin (COUMADIN) 5 MG tablet Take 5-7.5 mg by mouth daily. 7.5 mg Monday Wednesday Friday and 5 mg Tuesday Thursday Saturday and Sunday       No current facility-administered medications for this visit.    Allergies  Allergen Reactions  . Chantix [Varenicline] Nausea And Vomiting  . Lyrica [Pregabalin]     Swelling, couldn't breathe  . Nsaids Other (See Comments)    Unknown   . Simvastatin     Pt said he had a "bleed out" after taking it  . Ziac [Bisoprolol-Hydrochlorothiazide]     fatigue    History   Social History  . Marital Status: Married    Spouse Name: N/A    Number of Children: 3  . Years of Education: N/A   Occupational History  . Not on file.   Social History Main Topics  . Smoking status:  Current Every Day Smoker -- 0.50 packs/day for 40 years    Types: Cigarettes  . Smokeless tobacco: Never Used  . Alcohol Use: No  . Drug Use: No  . Sexual Activity: Not on file   Other Topics Concern  . Not on file   Social History Narrative   Lives in [place] with disabled wife, three sons, and grandson   Retired     Review of Systems: General: negative for chills, fever, night sweats or weight changes.  Cardiovascular: negative for chest pain, dyspnea on exertion, edema, orthopnea, palpitations, paroxysmal nocturnal dyspnea or shortness of breath Dermatological: negative for rash Respiratory: negative for cough or wheezing Urologic: negative for hematuria Abdominal: negative for nausea, vomiting, diarrhea, bright red blood per rectum, melena, or hematemesis Neurologic: negative for visual changes, syncope, or dizziness All other systems reviewed and are otherwise negative except as noted above.    Blood pressure 140/60, pulse 60, height 5\' 9"  (1.753 m), weight 229 lb (103.874 kg).  General appearance: alert, cooperative, no distress and moderately obese Lungs: decreased breath sounds Heart: regular rate and rhythm Extremities: Rt fa site without hematoma    ASSESSMENT AND PLAN:   PAD,s/p aorto bifem BG, s/p Rt Fem PTA 3/15 S/P Rt PTA  Permanent atrial fibrillation .  CKD (chronic kidney disease) stage 3, GFR 30-59 ml/min .  CAD- CABG '00, all SVGs occluded 2010, low risk Nuc 2012 .  AORTIC VALVE DISORDERS, hx Mechanical AVR [employer] .  Chronic anticoagulation .    PLAN  Dopplers Monday, check INR today- f/u with primary care after this.   [facility] KPA-C 01/16/2014 5:51 PM

## 2014-01-16 NOTE — Assessment & Plan Note (Signed)
S/P Rt PTA

## 2014-01-21 ENCOUNTER — Ambulatory Visit (HOSPITAL_COMMUNITY)
Admission: RE | Admit: 2014-01-21 | Discharge: 2014-01-21 | Disposition: A | Discharge status: Home / Self Care | Payer: Medicare Other | Source: Ambulatory Visit | Attending: Cardiology | Admitting: Cardiology

## 2014-01-21 ENCOUNTER — Ambulatory Visit: Payer: Self-pay | Admitting: Emergency Medicine

## 2014-01-21 DIAGNOSIS — I739 Peripheral vascular disease, unspecified: Secondary | ICD-10-CM | POA: Insufficient documentation

## 2014-01-21 NOTE — Progress Notes (Signed)
Right Lower Arterial Duplex Competed. Oda Cogan, BS, RDMS, RVT

## 2014-01-22 ENCOUNTER — Ambulatory Visit (INDEPENDENT_AMBULATORY_CARE_PROVIDER_SITE_OTHER): Payer: Medicare Other | Admitting: Emergency Medicine

## 2014-01-22 ENCOUNTER — Encounter: Payer: Self-pay | Admitting: Emergency Medicine

## 2014-01-22 VITALS — BP 142/76 | HR 58 | Temp 98.4°F | Resp 18 | Ht 69.0 in | Wt 230.0 lb

## 2014-01-22 DIAGNOSIS — Z7901 Long term (current) use of anticoagulants: Secondary | ICD-10-CM

## 2014-01-22 DIAGNOSIS — R6889 Other general symptoms and signs: Secondary | ICD-10-CM

## 2014-01-22 DIAGNOSIS — I10 Essential (primary) hypertension: Secondary | ICD-10-CM

## 2014-01-22 LAB — CBC WITH DIFFERENTIAL/PLATELET
BASOS PCT: 1 % (ref 0–1)
Basophils Absolute: 0.1 10*3/uL (ref 0.0–0.1)
EOS ABS: 0.3 10*3/uL (ref 0.0–0.7)
Eosinophils Relative: 3 % (ref 0–5)
HCT: 36.1 % — ABNORMAL LOW (ref 39.0–52.0)
Hemoglobin: 12.8 g/dL — ABNORMAL LOW (ref 13.0–17.0)
LYMPHS ABS: 2.1 10*3/uL (ref 0.7–4.0)
Lymphocytes Relative: 25 % (ref 12–46)
MCH: 30.3 pg (ref 26.0–34.0)
MCHC: 35.5 g/dL (ref 30.0–36.0)
MCV: 85.5 fL (ref 78.0–100.0)
MONOS PCT: 9 % (ref 3–12)
Monocytes Absolute: 0.8 10*3/uL (ref 0.1–1.0)
NEUTROS ABS: 5.2 10*3/uL (ref 1.7–7.7)
NEUTROS PCT: 62 % (ref 43–77)
PLATELETS: 296 10*3/uL (ref 150–400)
RBC: 4.22 MIL/uL (ref 4.22–5.81)
RDW: 14.9 % (ref 11.5–15.5)
WBC: 8.4 10*3/uL (ref 4.0–10.5)

## 2014-01-22 LAB — BASIC METABOLIC PANEL WITH GFR
BUN: 30 mg/dL — ABNORMAL HIGH (ref 6–23)
CALCIUM: 9.4 mg/dL (ref 8.4–10.5)
CO2: 32 mEq/L (ref 19–32)
Chloride: 96 mEq/L (ref 96–112)
Creat: 2.08 mg/dL — ABNORMAL HIGH (ref 0.50–1.35)
GFR, EST AFRICAN AMERICAN: 36 mL/min — AB
GFR, Est Non African American: 31 mL/min — ABNORMAL LOW
Glucose, Bld: 137 mg/dL — ABNORMAL HIGH (ref 70–99)
Potassium: 5 mEq/L (ref 3.5–5.3)
Sodium: 134 mEq/L — ABNORMAL LOW (ref 135–145)

## 2014-01-22 NOTE — Patient Instructions (Signed)
Smoking Cessation, Tips for Success If you are ready to quit smoking, congratulations! You have chosen to help yourself be healthier. Cigarettes bring nicotine, tar, carbon monoxide, and other irritants into your body. Your lungs, heart, and blood vessels will be able to work better without these poisons. There are many different ways to quit smoking. Nicotine gum, nicotine patches, a nicotine inhaler, or nicotine nasal spray can help with physical craving. Hypnosis, support groups, and medicines help break the habit of smoking. WHAT THINGS CAN I DO TO MAKE QUITTING EASIER?  Here are some tips to help you quit for good:  Pick a date when you will quit smoking completely. Tell all of your friends and family about your plan to quit on that date.  Do not try to slowly cut down on the number of cigarettes you are smoking. Pick a quit date and quit smoking completely starting on that day.  Throw away all cigarettes.   Clean and remove all ashtrays from your home, work, and car.   On a card, write down your reasons for quitting. Carry the card with you and read it when you get the urge to smoke.   Cleanse your body of nicotine. Drink enough water and fluids to keep your urine clear or pale yellow. Do this after quitting to flush the nicotine from your body.   Learn to predict your moods. Do not let a bad situation be your excuse to have a cigarette. Some situations in your life might tempt you into wanting a cigarette.   Never have "just one" cigarette. It leads to wanting another and another. Remind yourself of your decision to quit.   Change habits associated with smoking. If you smoked while driving or when feeling stressed, try other activities to replace smoking. Stand up when drinking your coffee. Brush your teeth after eating. Sit in a different chair when you read the paper. Avoid alcohol while trying to quit, and try to drink fewer caffeinated beverages. Alcohol and caffeine may urge  you to smoke.   Avoid foods and drinks that can trigger a desire to smoke, such as sugary or spicy foods and alcohol.   Ask people who smoke not to smoke around you.   Have something planned to do right after eating or having a cup of coffee. For example, plan to take a walk or exercise.   Try a relaxation exercise to calm you down and decrease your stress. Remember, you may be tense and nervous for the first 2 weeks after you quit, but this will pass.   Find new activities to keep your hands busy. Play with a pen, coin, or rubber band. Doodle or draw things on paper.   Brush your teeth right after eating. This will help cut down on the craving for the taste of tobacco after meals. You can also try mouthwash.   Use oral substitutes in place of cigarettes. Try using lemon drops, carrots, cinnamon sticks, or chewing gum. Keep them handy so they are available when you have the urge to smoke.   When you have the urge to smoke, try deep breathing.   Designate your home as a nonsmoking area.   If you are a heavy smoker, ask your health care provider about a prescription for nicotine chewing gum. It can ease your withdrawal from nicotine.   Reward yourself. Set aside the cigarette money you save and buy yourself something nice.   Look for support from others. Join a support group or   smoking cessation program. Ask someone at home or at work to help you with your plan to quit smoking.   Always ask yourself, "Do I need this cigarette or is this just a reflex?" Tell yourself, "Today, I choose not to smoke," or "I do not want to smoke." You are reminding yourself of your decision to quit.  Do not replace cigarette smoking with electronic cigarettes (commonly called e-cigarettes). The safety of e-cigarettes is unknown, and some may contain harmful chemicals.  If you relapse, do not give up! Plan ahead and think about what you will do the next time you get the urge to smoke.  HOW WILL  I FEEL WHEN I QUIT SMOKING? You may have symptoms of withdrawal because your body is used to nicotine (the addictive substance in cigarettes). You may crave cigarettes, be irritable, feel very hungry, cough often, get headaches, or have difficulty concentrating. The withdrawal symptoms are only temporary. They are strongest when you first quit but will go away within 10 14 days. When withdrawal symptoms occur, stay in control. Think about your reasons for quitting. Remind yourself that these are signs that your body is healing and getting used to being without cigarettes. Remember that withdrawal symptoms are easier to treat than the major diseases that smoking can cause.  Even after the withdrawal is over, expect periodic urges to smoke. However, these cravings are generally short lived and will go away whether you smoke or not. Do not smoke!  WHAT RESOURCES ARE AVAILABLE TO HELP ME QUIT SMOKING? Your health care provider can direct you to community resources or hospitals for support, which may include:  Group support.  Education.  Hypnosis.  Therapy. Document Released: 07/02/2004 Document Revised: 07/25/2013 Document Reviewed: 03/22/2013 [employer] Patient Information 2014 [employer].  

## 2014-01-22 NOTE — Progress Notes (Signed)
Subjective:    Patient ID: Robert Stanton, male    DOB: 12-Jun-1941, 73 y.o.   MRN: [id number]  HPI Comments: 73 yo male with recent hospitalization for stent repair of right leg. He released 01/11/14 from hospital with PT 2.03, he notes he has had improved edema and pulse in right LE since surgery. He is still trying to decrease tobacco. He is not exercising yet due to recent surgery. He notes he is still fatigued. He needs coumadin rechecked and denies any bruising/ bleeding. He is also concerned about mildly elevated BMP and wants recheck. He is trying to increase h2o. He denies any other urine related symptoms.   Hyperlipidemia      Medication List       This list is accurate as of: 01/22/14 10:40 AM.  Always use your most recent med list.               albuterol (2.5 MG/3ML) 0.083% nebulizer solution  Commonly known as:  PROVENTIL  Take 2.5 mg by nebulization every 6 (six) hours as needed for wheezing or shortness of breath.     bumetanide 2 MG tablet  Commonly known as:  BUMEX  Take 2 mg by mouth 2 (two) times daily.     cetirizine 10 MG tablet  Commonly known as:  ZYRTEC  Take 10 mg by mouth daily.     citalopram 40 MG tablet  Commonly known as:  CELEXA  Take 40 mg by mouth daily.     clonazePAM 1 MG tablet  Commonly known as:  KLONOPIN  Take 1 mg by mouth 3 (three) times daily as needed for anxiety.     diltiazem 120 MG 24 hr capsule  Commonly known as:  TIAZAC  Take 120 mg by mouth daily.     febuxostat 40 MG tablet  Commonly known as:  ULORIC  Take 40 mg by mouth daily. For Gout     fenofibrate micronized 134 MG capsule  Commonly known as:  LOFIBRA  Take 134 mg by mouth daily before breakfast.     ferrous sulfate 325 (65 FE) MG tablet  Take 325 mg by mouth daily.     ipratropium-albuterol 0.5-2.5 (3) MG/3ML Soln  Commonly known as:  DUONEB  Inhale 3 mLs into the lungs every 6 (six) hours as needed.     isosorbide mononitrate 30 MG 24 hr tablet   Commonly known as:  IMDUR  Take 30 mg by mouth daily.     levothyroxine 50 MCG tablet  Commonly known as:  SYNTHROID, LEVOTHROID  Take 50 mcg by mouth daily before breakfast.     nitroGLYCERIN 0.4 MG/SPRAY spray  Commonly known as:  NITROLINGUAL  Place 1 spray under the tongue every 5 (five) minutes x 3 doses as needed for chest pain.     pravastatin 40 MG tablet  Commonly known as:  PRAVACHOL  Take 40 mg by mouth every morning.     tiotropium 18 MCG inhalation capsule  Commonly known as:  SPIRIVA  Place 18 mcg into inhaler and inhale daily as needed (for shortness of breath).     VITAMIN B 12 PO  Take 1,500 mcg by mouth daily.     VITAMIN D PO  Take 5,000 Units by mouth daily.     warfarin 5 MG tablet  Commonly known as:  COUMADIN  Take 5-7.5 mg by mouth daily. 7.5 mg Monday Wednesday Friday and 5 mg Tuesday Thursday Saturday and Sunday  Review of Systems  Constitutional: Positive for fatigue.  All other systems reviewed and are negative.   BP 142/76  Pulse 58  Temp(Src) 98.4 F (36.9 C) (Temporal)  Resp 18  Ht 5\' 9"  (1.753 m)  Wt 230 lb (104.327 kg)  BMI 33.95 kg/m2  SpO2 98%     Objective:   Physical Exam  Nursing note and vitals reviewed. Constitutional: He is oriented to person, place, and time. He appears well-developed and well-nourished.  HENT:  Head: Normocephalic and atraumatic.  Right Ear: External ear normal.  Left Ear: External ear normal.  Nose: Nose normal.  Mouth/Throat: Oropharynx is clear and moist. No oropharyngeal exudate.  Eyes: Conjunctivae are normal.  Neck: Normal range of motion.  Cardiovascular: Normal rate, regular rhythm, normal heart sounds and intact distal pulses.   Left LE 1 + edema  Pulmonary/Chest: Effort normal and breath sounds normal.  Abdominal: Soft. Bowel sounds are normal.  Musculoskeletal: Normal range of motion.  Lymphadenopathy:    He has no cervical adenopathy.  Neurological: He is alert and  oriented to person, place, and time.  Skin: Skin is warm and dry.  Psychiatric: He has a normal mood and affect. His behavior is normal. Judgment and thought content normal.          Assessment & Plan:  1. HTN- Check BP call if >130/80, increase cardio, slowly increase activity and monitor Left LE edema, elevate legs TID x 10 minutes each   2. Coumadin prophylaxis with recent hospitalization for stent repair right leg- Recheck level to ensure stability  3. Abnormal BMP- recheck   4. Tobacco dep- Advised cessation and patient aware of increased risks with usage

## 2014-01-23 LAB — PROTIME-INR
INR: 3.58 — AB (ref <1.50)
PROTHROMBIN TIME: 34.7 s — AB (ref 11.6–15.2)

## 2014-01-28 ENCOUNTER — Telehealth: Payer: Self-pay | Admitting: *Deleted

## 2014-01-28 DIAGNOSIS — I739 Peripheral vascular disease, unspecified: Secondary | ICD-10-CM

## 2014-01-28 NOTE — Telephone Encounter (Signed)
Message copied by Chauncy Lean on Mon Jan 28, 2014  2:23 PM ------      Message from: Lorretta Harp      Created: Sat Jan 26, 2014  9:36 AM       ABI signif improved on right after PTA RFPBG. Repeat in 6 months ------

## 2014-01-28 NOTE — Telephone Encounter (Signed)
Order placed for repeat lower extremity arterial doppler in 6 months  

## 2014-02-04 ENCOUNTER — Ambulatory Visit (INDEPENDENT_AMBULATORY_CARE_PROVIDER_SITE_OTHER): Payer: Medicare Other

## 2014-02-04 DIAGNOSIS — Z79899 Other long term (current) drug therapy: Secondary | ICD-10-CM

## 2014-02-04 DIAGNOSIS — R6889 Other general symptoms and signs: Secondary | ICD-10-CM

## 2014-02-04 LAB — BASIC METABOLIC PANEL WITH GFR
BUN: 21 mg/dL (ref 6–23)
CHLORIDE: 101 meq/L (ref 96–112)
CO2: 28 mEq/L (ref 19–32)
Calcium: 8.5 mg/dL (ref 8.4–10.5)
Creat: 1.66 mg/dL — ABNORMAL HIGH (ref 0.50–1.35)
GFR, EST NON AFRICAN AMERICAN: 41 mL/min — AB
GFR, Est African American: 47 mL/min — ABNORMAL LOW
Glucose, Bld: 168 mg/dL — ABNORMAL HIGH (ref 70–99)
Potassium: 4.5 mEq/L (ref 3.5–5.3)
Sodium: 136 mEq/L (ref 135–145)

## 2014-02-04 LAB — PROTIME-INR
INR: 2.52 — ABNORMAL HIGH (ref <1.50)
PROTHROMBIN TIME: 26.5 s — AB (ref 11.6–15.2)

## 2014-02-04 NOTE — Progress Notes (Signed)
Patient ID: Robert Stanton, male   DOB: 07/07/1941, 73 y.o.   MRN: [id number] Patient here today to recheck BMET and PT/INR, patient verifies that he is currently taking Coumadin 5 mg daily.

## 2014-02-21 ENCOUNTER — Encounter: Payer: Self-pay | Admitting: Internal Medicine

## 2014-02-21 ENCOUNTER — Ambulatory Visit (INDEPENDENT_AMBULATORY_CARE_PROVIDER_SITE_OTHER): Payer: Medicare Other | Admitting: Internal Medicine

## 2014-02-21 VITALS — BP 124/76 | HR 68 | Temp 97.2°F | Resp 16 | Ht 68.0 in | Wt 229.2 lb

## 2014-02-21 DIAGNOSIS — I10 Essential (primary) hypertension: Secondary | ICD-10-CM

## 2014-02-21 DIAGNOSIS — K222 Esophageal obstruction: Secondary | ICD-10-CM

## 2014-02-21 DIAGNOSIS — G4730 Sleep apnea, unspecified: Secondary | ICD-10-CM

## 2014-02-21 DIAGNOSIS — Z79899 Other long term (current) drug therapy: Secondary | ICD-10-CM

## 2014-02-21 DIAGNOSIS — I739 Peripheral vascular disease, unspecified: Secondary | ICD-10-CM

## 2014-02-21 DIAGNOSIS — E7800 Pure hypercholesterolemia, unspecified: Secondary | ICD-10-CM

## 2014-02-21 DIAGNOSIS — R7303 Prediabetes: Secondary | ICD-10-CM

## 2014-02-21 DIAGNOSIS — Z7901 Long term (current) use of anticoagulants: Secondary | ICD-10-CM

## 2014-02-21 DIAGNOSIS — R7309 Other abnormal glucose: Secondary | ICD-10-CM

## 2014-02-21 DIAGNOSIS — E559 Vitamin D deficiency, unspecified: Secondary | ICD-10-CM

## 2014-02-21 DIAGNOSIS — D126 Benign neoplasm of colon, unspecified: Secondary | ICD-10-CM

## 2014-02-21 LAB — CBC WITH DIFFERENTIAL/PLATELET
Basophils Absolute: 0.1 10*3/uL (ref 0.0–0.1)
Basophils Relative: 1 % (ref 0–1)
Eosinophils Absolute: 0.1 10*3/uL (ref 0.0–0.7)
Eosinophils Relative: 2 % (ref 0–5)
HCT: 36.7 % — ABNORMAL LOW (ref 39.0–52.0)
Hemoglobin: 12.5 g/dL — ABNORMAL LOW (ref 13.0–17.0)
Lymphocytes Relative: 34 % (ref 12–46)
Lymphs Abs: 2.1 10*3/uL (ref 0.7–4.0)
MCH: 30.6 pg (ref 26.0–34.0)
MCHC: 34.1 g/dL (ref 30.0–36.0)
MCV: 89.7 fL (ref 78.0–100.0)
Monocytes Absolute: 0.6 10*3/uL (ref 0.1–1.0)
Monocytes Relative: 9 % (ref 3–12)
Neutro Abs: 3.4 10*3/uL (ref 1.7–7.7)
Neutrophils Relative %: 54 % (ref 43–77)
Platelets: 242 10*3/uL (ref 150–400)
RBC: 4.09 MIL/uL — ABNORMAL LOW (ref 4.22–5.81)
RDW: 14.8 % (ref 11.5–15.5)
WBC: 6.3 10*3/uL (ref 4.0–10.5)

## 2014-02-21 LAB — PROTIME-INR
INR: 2.13 — ABNORMAL HIGH (ref <1.50)
Prothrombin Time: 23.3 seconds — ABNORMAL HIGH (ref 11.6–15.2)

## 2014-02-21 LAB — HEMOGLOBIN A1C
Hgb A1c MFr Bld: 6.6 % — ABNORMAL HIGH (ref <5.7)
Mean Plasma Glucose: 143 mg/dL — ABNORMAL HIGH (ref <117)

## 2014-02-21 NOTE — Progress Notes (Signed)
Patient ID: Robert Stanton, male   DOB: 11/10/1940, 73 y.o.   MRN: [id number]    This very nice 73 y.o. MWM presents for 3 month follow up with Hypertension, Hyperlipidemia, Pre-Diabetes and Vitamin D Deficiency.    HTN predates many years. BP has been controlled at home. In 2000, he underwent a CABG and prosthetic AoVR. Today's BP: 124/76 mmHg (124/76-left arm and 138/80-right arm). Patient denies any cardiac type chest pain, palpitations, dyspnea/orthopnea/PND, dizziness, claudication, or dependent edema.   Hyperlipidemia is controlled with diet & meds. Last Lipids in Jan 2015 were at goal as below.Patient denies myalgias or other med SE's.  Lab Results  Component Value Date   CHOL 162 11/15/2013   HDL 35* 11/15/2013   LDLCALC 93 11/15/2013   TRIG 171* 11/15/2013   CHOLHDL 4.6 11/15/2013    Also, the patient has history of PreDiabetes since 2010 with last A1c of 5.8% in Oct 2014. Patient denies any symptoms of reactive hypoglycemia, diabetic polys, paresthesias or visual blurring.   Further, Patient has history of Vitamin D Deficiency with last vitamin D of 53 in Oct 2014. Patient supplements vitamin D without any suspected side-effects.    Medication List     albuterol (2.5 MG/3ML) 0.083% nebulizer solution  Commonly known as:  PROVENTIL  Take 2.5 mg by nebulization every 6 (six) hours as needed for wheezing or shortness of breath.     aspirin 81 MG tablet  Take 81 mg by mouth daily.     bumetanide 2 MG tablet  Commonly known as:  BUMEX  Take 2 mg by mouth 2 (two) times daily.     citalopram 40 MG tablet  Commonly known as:  CELEXA  Take 40 mg by mouth daily.     clonazePAM 1 MG tablet  Commonly known as:  KLONOPIN  Take 1 mg by mouth 3 (three) times daily as needed for anxiety.     diltiazem 120 MG 24 hr capsule  Commonly known as:  TIAZAC  Take 120 mg by mouth daily.     febuxostat 40 MG tablet  Commonly known as:  ULORIC  Take 40 mg by mouth daily. For Gout     fenofibrate micronized 134 MG capsule  Commonly known as:  LOFIBRA  Take 134 mg by mouth daily before breakfast.     ferrous sulfate 325 (65 FE) MG tablet  Take 325 mg by mouth daily.     ipratropium-albuterol 0.5-2.5 (3) MG/3ML Soln  Commonly known as:  DUONEB  Inhale 3 mLs into the lungs every 6 (six) hours as needed.     isosorbide mononitrate 30 MG 24 hr tablet  Commonly known as:  IMDUR  Take 30 mg by mouth daily.     levothyroxine 50 MCG tablet  Commonly known as:  SYNTHROID, LEVOTHROID  Take 50 mcg by mouth daily before breakfast.     loratadine 10 MG tablet  Commonly known as:  CLARITIN  Take 10 mg by mouth daily.     nitroGLYCERIN 0.4 MG/SPRAY spray  Commonly known as:  NITROLINGUAL  Place 1 spray under the tongue every 5 (five) minutes x 3 doses as needed for chest pain.     pravastatin 40 MG tablet  Commonly known as:  PRAVACHOL  Take 40 mg by mouth every morning.     tiotropium 18 MCG inhalation capsule  Commonly known as:  SPIRIVA  Place 18 mcg into inhaler and inhale daily as needed (for shortness of breath).  VITAMIN B 12 PO  Take 1,500 mcg by mouth daily.     VITAMIN D PO  Take 5,000 Units by mouth daily.     warfarin 5 MG tablet  Commonly known as:  COUMADIN  Take 5 mg by mouth daily. Take as directed       Allergies  Allergen Reactions  . Chantix [Varenicline] Nausea And Vomiting  . Lyrica [Pregabalin]     Swelling, couldn't breathe  . Nsaids Other (See Comments)    Unknown   . Simvastatin     Pt said he had a "bleed out" after taking it  . Ziac [Bisoprolol-Hydrochlorothiazide]     fatigue   PMHx:   Past Medical History  Diagnosis Date  . Mechanical heart valve present     a. Hx mechanical [employer] AVR 2000.  . Cataracts, bilateral   . OSA (obstructive sleep apnea)   . Permanent atrial fibrillation   . Ischemic cardiomyopathy   . History of echocardiogram 04/17/2013    EF 40-45%; LV hypertrophy; LV mild-mod dilated; AV regurg.; LA  severely diated, RA mildly dilated  . History of Doppler ultrasound 04/05/2013    LEAs; small distal abd. aoritc aneuysm is stable; fem-pop graft to R leg has 50-69% blockage  . History of nuclear stress test 05/14/2011    Dipyridamole; moderate perfusion defect in Basal Infrioer & Mid Inferior region - consistent w/infarct or scar; global LV systolic function mildly reduced; EKG negative for ischemia; low risk scan  . COPD (chronic obstructive pulmonary disease)   . Hyperlipidemia   . Hypertension   . Kidney cysts   . CKD (chronic kidney disease), stage III   . Hiatal hernia   . GERD (gastroesophageal reflux disease)   . Prediabetes   . Gout   . AAA (abdominal aortic aneurysm)   . Vitamin B deficiency   . Peripheral arterial disease     a. s/p aorto bifem BG;  b. 11/2013 PVA distal R Fem-fem stenosis;  c. 12/2013 PTA of dist R femoral bypass graft.  [name] CAD (coronary artery disease)     a. CABG '00. b. all SVGs occluded 2010. c. low risk Nuc 2012   . Chronic anticoagulation    FHx:    Reviewed / unchanged  SHx:    Reviewed / unchanged   Systems Review: Constitutional: Denies fever, chills, wt changes, headaches, insomnia, fatigue, night sweats, change in appetite. Eyes: Denies redness, blurred vision, diplopia, discharge, itchy, watery eyes.  ENT: Denies discharge, congestion, post nasal drip, epistaxis, sore throat, earache, hearing loss, dental pain, tinnitus, vertigo, sinus pain, snoring.  CV: Denies chest pain, palpitations, irregular heartbeat, syncope, dyspnea, diaphoresis, orthopnea, PND, claudication or edema. Respiratory: denies cough, dyspnea, DOE, pleurisy, hoarseness, laryngitis, wheezing.  Gastrointestinal: Denies dysphagia, odynophagia, heartburn, reflux, water brash, abdominal pain or cramps, nausea, vomiting, bloating, diarrhea, constipation, hematemesis, melena, hematochezia  or hemorrhoids. Genitourinary: Denies dysuria, frequency, urgency, nocturia, hesitancy,  discharge, hematuria or flank pain. Musculoskeletal: Denies arthralgias, myalgias, stiffness, jt. swelling, pain, limping or strain/sprain.  Skin: Denies pruritus, rash, hives, warts, acne, eczema or change in skin lesion(s). Neuro: No weakness, tremor, incoordination, spasms, paresthesia or pain. Psychiatric: Denies confusion, memory loss or sensory loss. Endo: Denies change in weight, skin or hair change.  Heme/Lymph: No excessive bleeding, bruising or enlarged lymph nodes.   Exam:  BP 124/76  Pulse 68  Temp 97.2 F   Resp 16  Ht 5\' 8"    Wt 229 lb 3.2 oz   BMI  34.86 kg/m2  Appears over nourished - in no distress. Strong tobacco stench. Eyes: PERRLA, EOMs, conjunctiva no swelling or erythema. Sinuses: No frontal/maxillary tenderness ENT/Mouth: EAC's clear, TM's nl w/o erythema, bulging. Nares clear w/o erythema, swelling, exudates. Oropharynx clear without erythema or exudates. Oral hygiene is good. Tongue normal, non obstructing. Hearing intact.  Neck: Supple. Thyroid nl. Car 2+/2+ without bruits, nodes or JVD. Chest: Respirations nl with BS distant, clear & equal w/scattered  Rales,but no  rhonchi, wheezing or stridor.  Cor: Prosthetic heart sounds normal w/ regular rate and rhythm without sig. murmurs, gallops, clicks, or rubs. Peripheral pulses normal and equal  without edema.  Abdomen: Soft & bowel sounds normal. Non-tender w/o guarding, rebound, hernias, masses, or organomegaly.  Lymphatics: Unremarkable.  Musculoskeletal: Full ROM all peripheral extremities, joint stability, 5/5 strength, and normal gait.  Skin: Warm, dry without exposed rashes, lesions or ecchymosis apparent.  Neuro: Cranial nerves intact, reflexes equal bilaterally. Sensory-motor testing grossly intact. Tendon reflexes grossly intact.  Pysch: Alert & oriented x 3. Insight and judgement nl & appropriate. No ideations.  Assessment and Plan:  1. Hypertension - Continue monitor blood pressure at home.  Continue diet/meds same.  2. Hyperlipidemia - Continue diet/meds, exercise,& lifestyle modifications. Continue monitor periodic cholesterol/liver & renal functions   3. Pre-diabetes - Continue diet, exercise, lifestyle modifications. Monitor appropriate labs.  4. Vitamin D Deficiency - Continue supplementation.  5. ASCAD S/P CABG and Prosth AoVR & Ch Afib - continue coumadin prophylaxis.  6. COPD - discouraged smoking  Recommended regular exercise, BP monitoring, weight control, and discussed med and SE's. Recommended labs to assess and monitor clinical status. Further disposition pending results of labs.

## 2014-02-21 NOTE — Patient Instructions (Signed)

## 2014-02-22 LAB — BASIC METABOLIC PANEL WITH GFR
BUN: 24 mg/dL — ABNORMAL HIGH (ref 6–23)
CO2: 26 meq/L (ref 19–32)
CREATININE: 1.77 mg/dL — AB (ref 0.50–1.35)
Calcium: 8.6 mg/dL (ref 8.4–10.5)
Chloride: 104 mEq/L (ref 96–112)
GFR, EST NON AFRICAN AMERICAN: 38 mL/min — AB
GFR, Est African American: 44 mL/min — ABNORMAL LOW
Glucose, Bld: 124 mg/dL — ABNORMAL HIGH (ref 70–99)
Potassium: 4.2 mEq/L (ref 3.5–5.3)
Sodium: 138 mEq/L (ref 135–145)

## 2014-02-22 LAB — HEPATIC FUNCTION PANEL
ALBUMIN: 3.9 g/dL (ref 3.5–5.2)
ALT: 13 U/L (ref 0–53)
AST: 19 U/L (ref 0–37)
Alkaline Phosphatase: 61 U/L (ref 39–117)
Bilirubin, Direct: 0.1 mg/dL (ref 0.0–0.3)
Indirect Bilirubin: 0.3 mg/dL (ref 0.2–1.2)
Total Bilirubin: 0.4 mg/dL (ref 0.2–1.2)
Total Protein: 6.8 g/dL (ref 6.0–8.3)

## 2014-02-22 LAB — LIPID PANEL
Cholesterol: 217 mg/dL — ABNORMAL HIGH (ref 0–200)
HDL: 30 mg/dL — AB (ref >39)
LDL Cholesterol: 142 mg/dL — ABNORMAL HIGH (ref 0–99)
Total CHOL/HDL Ratio: 7.2 Ratio
Triglycerides: 223 mg/dL — ABNORMAL HIGH (ref <150)
VLDL: 45 mg/dL — ABNORMAL HIGH (ref 0–40)

## 2014-02-22 LAB — INSULIN, FASTING: INSULIN FASTING, SERUM: 41 u[IU]/mL — AB (ref 3–28)

## 2014-02-22 LAB — TSH: TSH: 2.496 u[IU]/mL (ref 0.350–4.500)

## 2014-02-22 LAB — MAGNESIUM: MAGNESIUM: 1.8 mg/dL (ref 1.5–2.5)

## 2014-02-22 LAB — VITAMIN D 25 HYDROXY (VIT D DEFICIENCY, FRACTURES): VIT D 25 HYDROXY: 48 ng/mL (ref 30–89)

## 2014-03-25 ENCOUNTER — Ambulatory Visit (INDEPENDENT_AMBULATORY_CARE_PROVIDER_SITE_OTHER): Payer: Medicare Other | Admitting: Physician Assistant

## 2014-03-25 ENCOUNTER — Encounter: Payer: Self-pay | Admitting: Physician Assistant

## 2014-03-25 VITALS — BP 120/62 | HR 60 | Temp 97.9°F | Resp 16 | Ht 68.0 in | Wt 231.0 lb

## 2014-03-25 DIAGNOSIS — I4821 Permanent atrial fibrillation: Secondary | ICD-10-CM

## 2014-03-25 DIAGNOSIS — Z9181 History of falling: Secondary | ICD-10-CM

## 2014-03-25 DIAGNOSIS — Z0000 Encounter for general adult medical examination without abnormal findings: Secondary | ICD-10-CM

## 2014-03-25 DIAGNOSIS — I4891 Unspecified atrial fibrillation: Secondary | ICD-10-CM

## 2014-03-25 DIAGNOSIS — Z1331 Encounter for screening for depression: Secondary | ICD-10-CM

## 2014-03-25 DIAGNOSIS — Z23 Encounter for immunization: Secondary | ICD-10-CM

## 2014-03-25 DIAGNOSIS — N183 Chronic kidney disease, stage 3 unspecified: Secondary | ICD-10-CM

## 2014-03-25 LAB — CBC WITH DIFFERENTIAL/PLATELET
BASOS ABS: 0 10*3/uL (ref 0.0–0.1)
Basophils Relative: 0 % (ref 0–1)
EOS ABS: 0.2 10*3/uL (ref 0.0–0.7)
Eosinophils Relative: 3 % (ref 0–5)
HCT: 36.1 % — ABNORMAL LOW (ref 39.0–52.0)
Hemoglobin: 12 g/dL — ABNORMAL LOW (ref 13.0–17.0)
Lymphocytes Relative: 20 % (ref 12–46)
Lymphs Abs: 1.6 10*3/uL (ref 0.7–4.0)
MCH: 29.9 pg (ref 26.0–34.0)
MCHC: 33.2 g/dL (ref 30.0–36.0)
MCV: 89.8 fL (ref 78.0–100.0)
Monocytes Absolute: 0.8 10*3/uL (ref 0.1–1.0)
Monocytes Relative: 10 % (ref 3–12)
NEUTROS PCT: 67 % (ref 43–77)
Neutro Abs: 5.4 10*3/uL (ref 1.7–7.7)
PLATELETS: 246 10*3/uL (ref 150–400)
RBC: 4.02 MIL/uL — ABNORMAL LOW (ref 4.22–5.81)
RDW: 14.8 % (ref 11.5–15.5)
WBC: 8 10*3/uL (ref 4.0–10.5)

## 2014-03-25 LAB — BASIC METABOLIC PANEL WITH GFR
BUN: 24 mg/dL — ABNORMAL HIGH (ref 6–23)
CALCIUM: 9.1 mg/dL (ref 8.4–10.5)
CO2: 29 mEq/L (ref 19–32)
Chloride: 99 mEq/L (ref 96–112)
Creat: 1.83 mg/dL — ABNORMAL HIGH (ref 0.50–1.35)
GFR, EST AFRICAN AMERICAN: 42 mL/min — AB
GFR, EST NON AFRICAN AMERICAN: 36 mL/min — AB
Glucose, Bld: 161 mg/dL — ABNORMAL HIGH (ref 70–99)
Potassium: 4.3 mEq/L (ref 3.5–5.3)
Sodium: 135 mEq/L (ref 135–145)

## 2014-03-25 MED ORDER — BENZONATATE 100 MG PO CAPS: 100.0000 mg | ORAL_CAPSULE | Freq: Four times a day (QID) | ORAL | Status: DC | PRN

## 2014-03-25 NOTE — Patient Instructions (Addendum)
Chronic Obstructive Pulmonary Disease Chronic obstructive pulmonary disease (COPD) is a common lung condition in which airflow from the lungs is limited. COPD is a general term that can be used to describe many different lung problems that limit airflow, including both chronic bronchitis and emphysema. If you have COPD, your lung function will probably never return to normal, but there are measures you can take to improve lung function and make yourself feel better.  CAUSES   Smoking (common).   Exposure to secondhand smoke.   Genetic problems.  Chronic inflammatory lung diseases or recurrent infections. SYMPTOMS   Shortness of breath, especially with physical activity.   Deep, persistent (chronic) cough with a large amount of thick mucus.   Wheezing.   Rapid breaths (tachypnea).   Gray or bluish discoloration (cyanosis) of the skin, especially in fingers, toes, or lips.   Fatigue.   Weight loss.   Frequent infections or episodes when breathing symptoms become much worse (exacerbations).   Chest tightness. DIAGNOSIS  Your healthcare provider will take a medical history and perform a physical examination to make the initial diagnosis. Additional tests for COPD may include:   Lung (pulmonary) function tests.  Chest X-ray.  CT scan.  Blood tests. TREATMENT  Treatment available to help you feel better when you have COPD include:   Inhaler and nebulizer medicines. These help manage the symptoms of COPD and make your breathing more comfortable  Supplemental oxygen. Supplemental oxygen is only helpful if you have a low oxygen level in your blood.   Exercise and physical activity. These are beneficial for nearly all people with COPD. Some people may also benefit from a pulmonary rehabilitation program. HOME CARE INSTRUCTIONS   Take all medicines (inhaled or pills) as directed by your health care provider.  Only take over-the-counter or prescription medicines  for pain, fever, or discomfort as directed by your health care provider.   Avoid over-the-counter medicines or cough syrups that dry up your airway (such as antihistamines) and slow down the elimination of secretions unless instructed otherwise by your healthcare provider.   If you are a smoker, the most important thing that you can do is stop smoking. Continuing to smoke will cause further lung damage and breathing trouble. Ask your health care provider for help with quitting smoking. He or she can direct you to community resources or hospitals that provide support.  Avoid exposure to irritants such as smoke, chemicals, and fumes that aggravate your breathing.  Use oxygen therapy and pulmonary rehabilitation if directed by your health care provider. If you require home oxygen therapy, ask your healthcare provider whether you should purchase a pulse oximeter to measure your oxygen level at home.   Avoid contact with individuals who have a contagious illness.  Avoid extreme temperature and humidity changes.  Eat healthy foods. Eating smaller, more frequent meals and resting before meals may help you maintain your strength.  Stay active, but balance activity with periods of rest. Exercise and physical activity will help you maintain your ability to do things you want to do.  Preventing infection and hospitalization is very important when you have COPD. Make sure to receive all the vaccines your health care provider recommends, especially the pneumococcal and influenza vaccines. Ask your healthcare provider whether you need a pneumonia vaccine.  Learn and use relaxation techniques to manage stress.  Learn and use controlled breathing techniques as directed by your health care provider. Controlled breathing techniques include:   Pursed lip breathing. Start by breathing   in (inhaling) through your nose for 1 second. Then, purse your lips as if you were going to whistle and breathe out (exhale)  through the pursed lips for 2 seconds.   Diaphragmatic breathing. Start by putting one hand on your abdomen just above your waist. Inhale slowly through your nose. The hand on your abdomen should move out. Then purse your lips and exhale slowly. You should be able to feel the hand on your abdomen moving in as you exhale.   Learn and use controlled coughing to clear mucus from your lungs. Controlled coughing is a series of short, progressive coughs. The steps of controlled coughing are:  1. Lean your head slightly forward.  2. Breathe in deeply using diaphragmatic breathing.  3. Try to hold your breath for 3 seconds.  4. Keep your mouth slightly open while coughing twice.  5. Spit any mucus out into a tissue.  6. Rest and repeat the steps once or twice as needed. SEEK MEDICAL CARE IF:   You are coughing up more mucus than usual.   There is a change in the color or thickness of your mucus.   Your breathing is more labored than usual.   Your breathing is faster than usual.  SEEK IMMEDIATE MEDICAL CARE IF:   You have shortness of breath while you are resting.   You have shortness of breath that prevents you from:  Being able to talk.   Performing your usual physical activities.   You have chest pain lasting longer than 5 minutes.   Your skin color is more cyanotic than usual.  You measure low oxygen saturations for longer than 5 minutes with a pulse oximeter. MAKE SURE YOU:   Understand these instructions.  Will watch your condition.  Will get help right away if you are not doing well or get worse. Document Released: 07/14/2005 Document Revised: 07/25/2013 Document Reviewed: 05/31/2013 [facility] Patient Information 2014 [place], [place].  Preventative Care for Adults, Male       REGULAR HEALTH EXAMS:  A routine yearly physical is a good way to check in with your primary care provider about your health and preventive screening. It is also an opportunity to  share updates about your health and any concerns you have, and receive a thorough all-over exam.   Most health insurance companies pay for at least some preventative services.  Check with your health plan for specific coverages.  WHAT PREVENTATIVE SERVICES DO MEN NEED?  Adult men should have their weight and blood pressure checked regularly.   Men age 24 and older should have their cholesterol levels checked regularly.  Beginning at age 54 and continuing to age 37, men should be screened for colorectal cancer.  Certain people should may need continued testing until age 47.  Other cancer screening may include exams for testicular and prostate cancer.  Updating vaccinations is part of preventative care.  Vaccinations help protect against diseases such as the flu.  Lab tests are generally done as part of preventative care to screen for anemia and blood disorders, to screen for problems with the kidneys and liver, to screen for bladder problems, to check blood sugar, and to check your cholesterol level.  Preventative services generally include counseling about diet, exercise, avoiding tobacco, drugs, excessive alcohol consumption, and sexually transmitted infections.    GENERAL RECOMMENDATIONS FOR GOOD HEALTH:  Healthy diet:  Eat a variety of foods, including fruit, vegetables, animal or vegetable protein, such as meat, fish, chicken, and eggs, or beans, lentils,  tofu, and grains, such as rice.  Drink plenty of water daily.  Decrease saturated fat in the diet, avoid lots of red meat, processed foods, sweets, fast foods, and fried foods.  Exercise:  Aerobic exercise helps maintain good heart health. At least 30-40 minutes of moderate-intensity exercise is recommended. For example, a brisk walk that increases your heart rate and breathing. This should be done on most days of the week.   Find a type of exercise or a variety of exercises that you enjoy so that it becomes a part of your  daily life.  Examples are running, walking, swimming, water aerobics, and biking.  For motivation and support, explore group exercise such as aerobic class, spin class, Zumba, Yoga,or  martial arts, etc.    Set exercise goals for yourself, such as a certain weight goal, walk or run in a race such as a 5k walk/run.  Speak to your primary care provider about exercise goals.  Disease prevention:  If you smoke or chew tobacco, find out from your caregiver how to quit. It can literally save your life, no matter how long you have been a tobacco user. If you do not use tobacco, never begin.   Maintain a healthy diet and normal weight. Increased weight leads to problems with blood pressure and diabetes.   The Body Mass Index or BMI is a way of measuring how much of your body is fat. Having a BMI above 27 increases the risk of heart disease, diabetes, hypertension, stroke and other problems related to obesity. Your caregiver can help determine your BMI and based on it develop an exercise and dietary program to help you achieve or maintain this important measurement at a healthful level.  High blood pressure causes heart and blood vessel problems.  Persistent high blood pressure should be treated with medicine if weight loss and exercise do not work.   Fat and cholesterol leaves deposits in your arteries that can block them. This causes heart disease and vessel disease elsewhere in your body.  If your cholesterol is found to be high, or if you have heart disease or certain other medical conditions, then you may need to have your cholesterol monitored frequently and be treated with medication.   Ask if you should have a stress test if your history suggests this. A stress test is a test done on a treadmill that looks for heart disease. This test can find disease prior to there being a problem.  Avoid drinking alcohol in excess (more than two drinks per day).  Avoid use of street drugs. Do not share needles  with anyone. Ask for professional help if you need assistance or instructions on stopping the use of alcohol, cigarettes, and/or drugs.  Brush your teeth twice a day with fluoride toothpaste, and floss once a day. Good oral hygiene prevents tooth decay and gum disease. The problems can be painful, unattractive, and can cause other health problems. Visit your dentist for a routine oral and dental check up and preventive care every 6-12 months.   Look at your skin regularly.  Use a mirror to look at your back. Notify your caregivers of changes in moles, especially if there are changes in shapes, colors, a size larger than a pencil eraser, an irregular border, or development of new moles.  Safety:  Use seatbelts 100% of the time, whether driving or as a passenger.  Use safety devices such as hearing protection if you work in environments with loud noise or significant  background noise.  Use safety glasses when doing any work that could send debris in to the eyes.  Use a helmet if you ride a bike or motorcycle.  Use appropriate safety gear for contact sports.  Talk to your caregiver about gun safety.  Use sunscreen with a SPF (or skin protection factor) of 15 or greater.  Lighter skinned people are at a greater risk of skin cancer. Don't forget to also wear sunglasses in order to protect your eyes from too much damaging sunlight. Damaging sunlight can accelerate cataract formation.   Practice safe sex. Use condoms. Condoms are used for birth control and to help reduce the spread of sexually transmitted infections (or STIs).  Some of the STIs are gonorrhea (the clap), chlamydia, syphilis, trichomonas, herpes, HPV (human papilloma virus) and HIV (human immunodeficiency virus) which causes AIDS. The herpes, HIV and HPV are viral illnesses that have no cure. These can result in disability, cancer and death.   Keep carbon monoxide and smoke detectors in your home functioning at all times. Change the batteries  every 6 months or use a model that plugs into the wall.   Vaccinations:  Stay up to date with your tetanus shots and other required immunizations. You should have a booster for tetanus every 10 years. Be sure to get your flu shot every year, since 5%-20% of the [place] population comes down with the flu. The flu vaccine changes each year, so being vaccinated once is not enough. Get your shot in the fall, before the flu season peaks.   Other vaccines to consider:  Pneumococcal vaccine to protect against certain types of pneumonia.  This is normally recommended for adults age 64 or older.  However, adults younger than 71 years old with certain underlying conditions such as diabetes, heart or lung disease should also receive the vaccine.  Shingles vaccine to protect against Varicella Zoster if you are older than age 61, or younger than 71 years old with certain underlying illness.  Hepatitis A vaccine to protect against a form of infection of the liver by a virus acquired from food.  Hepatitis B vaccine to protect against a form of infection of the liver by a virus acquired from blood or body fluids, particularly if you work in health care.  If you plan to travel internationally, check with your local health department for specific vaccination recommendations.  Cancer Screening:  Most routine colon cancer screening begins at the age of 73. On a yearly basis, doctors may provide special easy to use take-home tests to check for hidden blood in the stool. Sigmoidoscopy or colonoscopy can detect the earliest forms of colon cancer and is life saving. These tests use a small camera at the end of a tube to directly examine the colon. Speak to your caregiver about this at age 12, when routine screening begins (and is repeated every 5 years unless early forms of pre-cancerous polyps or small growths are found).   At the age of 49 men usually start screening for prostate cancer every year. Screening may begin  at a younger age for those with higher risk. Those at higher risk include African-Americans or having a family history of prostate cancer. There are two types of tests for prostate cancer:   Prostate-specific antigen (PSA) testing. Recent studies raise questions about prostate cancer using PSA and you should discuss this with your caregiver.   Digital rectal exam (in which your doctor's lubricated and gloved finger feels for enlargement of the prostate  through the anus).   Screening for testicular cancer.  Do a monthly exam of your testicles. Gently roll each testicle between your thumb and fingers, feeling for any abnormal lumps. The best time to do this is after a hot shower or bath when the tissues are looser. Notify your caregivers of any lumps, tenderness or changes in size or shape immediately.

## 2014-03-25 NOTE — Progress Notes (Signed)
MEDICARE ANNUAL WELLNESS VISIT AND FOLLOW UP Assessment:   COPD exacerbation- discussed smoking cessation and how his smoking is a primary cause of all of his comorbidites and that they will continue to deteriorate. Afib with valve- check coumadin, goal 2.5-3.5, discussed risk of stroke   CRF- check BMP, especially since on Bumex 2 mg Prevnar 13 today   Plan:   During the course of the visit the patient was educated and counseled about appropriate screening and preventive services including:    Pneumococcal vaccine   Influenza vaccine  Hepatitis B vaccine  Td vaccine  Screening electrocardiogram  Colorectal cancer screening  Diabetes screening  Glaucoma screening  Nutrition counseling   Smoking cessation counseling  Advanced directives: given information  Screening recommendations, referrals: Vaccinations: Tdap vaccine not indicated Influenza vaccine not indicated Pneumococcal vaccine not indicated Shingles vaccine not indicated Hep B vaccine not indicated  Nutrition assessed and recommended  Colonoscopy due 2018 Recommended yearly ophthalmology/optometry visit for glaucoma screening and checkup Recommended yearly dental visit for hygiene and checkup Advanced directives - requested  Conditions/risks identified: BMI: Discussed weight loss, diet, and increase physical activity.  Increase physical activity: AHA recommends 150 minutes of physical activity a week.  Medications reviewed Diabetes is not at goal, ACE/ARB therapy: Yes. Urinary Incontinence is not an issue: discussed non pharmacology and pharmacology options.  Fall risk: HIGH- discussed PT, home fall assessment, medications.    Subjective:  Robert Stanton is a 73 y.o. male who presents for Medicare Annual Wellness Visit and coumadin follow up.  Date of last medicare wellness visit was is unknown.  His blood pressure has been controlled at home, today their BP is BP: 120/62 mmHg He does not  workout. He denies chest pain, shortness of breath, dizziness.  Patient is on Coumadin for AFIB and Valve.  Patient's last INR is  Lab Results  Component Value Date   INR 2.13* 02/21/2014   INR 2.52* 02/04/2014   INR 3.58* 01/22/2014   Patient denies  CP, dizziness, nose bleeds, easy bleeding, and blood in stool/urine. His coumadin dose was not changed last visit He states that his breathing got worse Thursday with increased thick white mucus and coughing. Weight is stable, no PND, orthopnea, fever, chills.  Wt Readings from Last 3 Encounters:  03/25/14 231 lb (104.781 kg)  02/21/14 229 lb 3.2 oz (103.964 kg)  01/22/14 230 lb (104.327 kg)     Names of Other Physician/Practitioners you currently use: 1. [facility] here for primary care 2. [facility], eye doctor, last visit 1 year ago Patient Care Team: Unk Pinto, MD as PCP - General (Internal Medicine) Pixie Casino, MD as Consulting Physician (Cardiology) Zack Seal, MD as Referring Physician (Urology) Inda Castle, MD as Consulting Physician (Gastroenterology) Milana Na, MD (General Surgery)  Medication Review: Current Outpatient Prescriptions on File Prior to Visit  Medication Sig Dispense Refill  . albuterol (PROVENTIL) (2.5 MG/3ML) 0.083% nebulizer solution Take 2.5 mg by nebulization every 6 (six) hours as needed for wheezing or shortness of breath.      [name] aspirin 81 MG tablet Take 81 mg by mouth daily.      . bumetanide (BUMEX) 2 MG tablet Take 2 mg by mouth 2 (two) times daily.      . Cholecalciferol (VITAMIN D PO) Take 5,000 Units by mouth daily.       . citalopram (CELEXA) 40 MG tablet Take 40 mg by mouth daily.       [name]  clonazePAM (KLONOPIN) 1 MG tablet Take 1 mg by mouth 3 (three) times daily as needed for anxiety.       . Cyanocobalamin (VITAMIN B 12 PO) Take 1,500 mcg by mouth daily.       [name] diltiazem (TIAZAC) 120 MG 24 hr capsule Take 120 mg by  mouth daily.       . febuxostat (ULORIC) 40 MG tablet Take 40 mg by mouth daily. For Gout      . fenofibrate micronized (LOFIBRA) 134 MG capsule Take 134 mg by mouth daily before breakfast.       . ferrous sulfate 325 (65 FE) MG tablet Take 325 mg by mouth daily.      [name] ipratropium-albuterol (DUONEB) 0.5-2.5 (3) MG/3ML SOLN Inhale 3 mLs into the lungs every 6 (six) hours as needed.       . isosorbide mononitrate (IMDUR) 30 MG 24 hr tablet Take 30 mg by mouth daily.       [name] levothyroxine (SYNTHROID, LEVOTHROID) 50 MCG tablet Take 50 mcg by mouth daily before breakfast.      . loratadine (CLARITIN) 10 MG tablet Take 10 mg by mouth daily.      . nitroGLYCERIN (NITROLINGUAL) 0.4 MG/SPRAY spray Place 1 spray under the tongue every 5 (five) minutes x 3 doses as needed for chest pain.      . pravastatin (PRAVACHOL) 40 MG tablet Take 40 mg by mouth every morning.       . tiotropium (SPIRIVA) 18 MCG inhalation capsule Place 18 mcg into inhaler and inhale daily as needed (for shortness of breath).       . warfarin (COUMADIN) 5 MG tablet Take 5 mg by mouth daily. Take as directed       No current facility-administered medications on file prior to visit.    Current Problems (verified) Patient Active Problem List   Diagnosis Date Noted  . Encounter for long-term (current) use of other medications 02/21/2014  . Vitamin D Deficiency 02/21/2014  . Long term (current) use of anticoagulants 01/16/2014  . Chronic anticoagulation   . Permanent atrial fibrillation 12/04/2013  . CKD (chronic kidney disease) stage 3, GFR 30-59 ml/min 12/04/2013  . Peripheral arterial disease 12/02/2013  . Arterial leg ulcer, rt foot 10/03/2013  . COPD  CT done 12/20/13 ok   . Kidney cysts   . Hiatal hernia   . GERD (gastroesophageal reflux disease)   . Prediabetes   . Gout   . OSA (obstructive sleep apnea)   . AAA (abdominal aortic aneurysm)   . Vitamin B deficiency   . Renal artery stenosis 05/01/2013  . PAD,s/p aorto  bifem BG, s/p Rt Fem PTA 3/15 04/06/2013  . Anemia 09/21/2012  . COLONIC POLYPS 12/26/2007  . Hyperlipidemia 12/26/2007  . HYPERTENSION 12/26/2007  . CAD- CABG '00, all SVGs occluded 2010, low risk Nuc 2012 12/26/2007  . CLAUDICATION 12/26/2007  . ESOPHAGEAL STRICTURE 12/26/2007  . SLEEP APNEA 12/26/2007    Screening Tests Health Maintenance  Topic Date Due  . Tetanus/tdap  12/25/1961  . Influenza Vaccine  05/18/2014  . Colonoscopy  08/08/2017  . Pneumococcal Polysaccharide Vaccine Age 38 And Over  Completed  . Zostavax  Completed    Immunization History  Administered Date(s) Administered  . DT 01/25/2012  . Influenza Split 06/28/2013  . Pneumococcal Polysaccharide-23 08/11/2012  . Zoster 10/30/2010    Preventative care: Last colonoscopy: 2008 due 2018  Prior vaccinations: TD or Tdap: 2013  Influenza: 2014 Pneumococcal: 2013 Shingles/Zostavax: 2012  Prevnar: NEEDS  History reviewed: allergies, current medications, past family history, past medical history, past social history, past surgical history and problem list   Risk Factors: Tobacco History  Substance Use Topics  . Smoking status: Current Every Day Smoker -- 0.50 packs/day for 40 years    Types: Cigarettes  . Smokeless tobacco: Never Used  . Alcohol Use: No   He does smoke.   Are there smokers in your home (other than you)?  Yes  Alcohol Current alcohol use: none  Caffeine Current caffeine use: coffee 3 /day  Exercise Current exercise: none  Nutrition/Diet Current diet: in general, an "unhealthy" diet  Cardiac risk factors: advanced age (older than 26 for men, 59 for women), diabetes mellitus, dyslipidemia, family history of premature cardiovascular disease, hypertension, male gender, obesity (BMI >= 30 kg/m2), sedentary lifestyle and smoking/ tobacco exposure.  Depression Screen (Note: if answer to either of the following is "Yes", a more complete depression screening is indicated)   Q1:  Over the past two weeks, have you felt down, depressed or hopeless? No  Q2: Over the past two weeks, have you felt little interest or pleasure in doing things? No  Have you lost interest or pleasure in daily life? No  Do you often feel hopeless? No  Do you cry easily over simple problems? No  Activities of Daily Living In your present state of health, do you have any difficulty performing the following activities?:  Driving? No Managing money?  No Feeding yourself? No Getting from bed to chair? No Climbing a flight of stairs? Yes Preparing food and eating?: No Bathing or showering? No Getting dressed: No Getting to the toilet? No Using the toilet:No Moving around from place to place: No In the past year have you fallen or had a near fall?:Yes   Are you sexually active?  No  Do you have more than one partner?  No  Vision Difficulties: Yes  Hearing Difficulties: Yes Do you often ask people to speak up or repeat themselves? Yes Do you experience ringing or noises in your ears? No Do you have difficulty understanding soft or whispered voices? Yes  Cognition  Do you feel that you have a problem with memory?Yes  Do you often misplace items? No  Do you feel safe at home?  Yes  Advanced directives Does patient have a [place]? Yes Does patient have a Living Will? Yes   Objective:   Blood pressure 120/62, pulse 60, temperature 97.9 F (36.6 C), resp. rate 16, height 5\' 8"  (1.727 m), weight 231 lb (104.781 kg). Body mass index is 35.13 kg/(m^2).  General appearance: alert, no distress, WD/WN, male Cognitive Testing  Alert? Yes  Normal Appearance?Yes  Oriented to person? Yes  Place? Yes   Time? Yes  Recall of three objects?  Yes  Can perform simple calculations? Yes  Displays appropriate judgment?Yes  Can read the correct time from a watch face?Yes  HEENT: normocephalic, sclerae anicteric, TMs pearly, nares patent, no discharge or erythema, pharynx  normal Oral cavity: MMM, no lesions Neck: supple, no lymphadenopathy, no thyromegaly, no masses Heart: Irreg irreg, with systolic click Lungs: decreased breath sounds with mild diffuse wheezing, without rhonchi, or rales Abdomen: +bs, soft, non tender, non distended, no masses, no hepatomegaly, no splenomegaly Musculoskeletal: nontender, no swelling, no obvious deformity Extremities: 1+ edema, no cyanosis, no clubbing Pulses: 1+ symmetric, upper and lower extremities Neurological: alert, oriented x 3, CN2-12 intact, strength normal upper extremities and lower extremities,  sensation decreased throughout, DTRs 2+ throughout, no cerebellar signs, gait normal Psychiatric: normal affect, behavior normal, pleasant   Medicare Attestation I have personally reviewed: The patient's medical and social history Their use of alcohol, tobacco or illicit drugs Their current medications and supplements The patient's functional ability including ADLs,fall risks, home safety risks, cognitive, and hearing and visual impairment Diet and physical activities Evidence for depression or mood disorders  The patient's weight, height, BMI, and visual acuity have been recorded in the chart.  I have made referrals, counseling, and provided education to the patient based on review of the above and I have provided the patient with a written personalized care plan for preventive services.     Vicie Mutters, PA-C   03/25/2014

## 2014-03-26 ENCOUNTER — Encounter: Payer: Self-pay | Admitting: Internal Medicine

## 2014-03-26 LAB — PROTIME-INR
INR: 3.31 — ABNORMAL HIGH (ref <1.50)
PROTHROMBIN TIME: 32.7 s — AB (ref 11.6–15.2)

## 2014-04-25 ENCOUNTER — Ambulatory Visit (INDEPENDENT_AMBULATORY_CARE_PROVIDER_SITE_OTHER): Payer: Medicare Other | Admitting: Internal Medicine

## 2014-04-25 ENCOUNTER — Encounter: Payer: Self-pay | Admitting: Internal Medicine

## 2014-04-25 VITALS — BP 126/62 | HR 64 | Temp 98.6°F | Resp 18 | Ht 68.0 in | Wt 232.6 lb

## 2014-04-25 DIAGNOSIS — R7303 Prediabetes: Secondary | ICD-10-CM

## 2014-04-25 DIAGNOSIS — Z1331 Encounter for screening for depression: Secondary | ICD-10-CM

## 2014-04-25 DIAGNOSIS — Z0000 Encounter for general adult medical examination without abnormal findings: Secondary | ICD-10-CM

## 2014-04-25 DIAGNOSIS — E559 Vitamin D deficiency, unspecified: Secondary | ICD-10-CM

## 2014-04-25 DIAGNOSIS — Z79899 Other long term (current) drug therapy: Secondary | ICD-10-CM

## 2014-04-25 DIAGNOSIS — E782 Mixed hyperlipidemia: Secondary | ICD-10-CM

## 2014-04-25 DIAGNOSIS — I10 Essential (primary) hypertension: Secondary | ICD-10-CM

## 2014-04-25 DIAGNOSIS — Z1212 Encounter for screening for malignant neoplasm of rectum: Secondary | ICD-10-CM

## 2014-04-25 DIAGNOSIS — E538 Deficiency of other specified B group vitamins: Secondary | ICD-10-CM

## 2014-04-25 DIAGNOSIS — Z125 Encounter for screening for malignant neoplasm of prostate: Secondary | ICD-10-CM

## 2014-04-25 DIAGNOSIS — Z789 Other specified health status: Secondary | ICD-10-CM

## 2014-04-25 DIAGNOSIS — Z7901 Long term (current) use of anticoagulants: Secondary | ICD-10-CM

## 2014-04-25 LAB — CBC WITH DIFFERENTIAL/PLATELET
Basophils Absolute: 0.1 10*3/uL (ref 0.0–0.1)
Basophils Relative: 1 % (ref 0–1)
Eosinophils Absolute: 0.3 10*3/uL (ref 0.0–0.7)
Eosinophils Relative: 4 % (ref 0–5)
HEMATOCRIT: 36.4 % — AB (ref 39.0–52.0)
Hemoglobin: 12.5 g/dL — ABNORMAL LOW (ref 13.0–17.0)
LYMPHS PCT: 29 % (ref 12–46)
Lymphs Abs: 1.9 10*3/uL (ref 0.7–4.0)
MCH: 30 pg (ref 26.0–34.0)
MCHC: 34.3 g/dL (ref 30.0–36.0)
MCV: 87.3 fL (ref 78.0–100.0)
MONOS PCT: 9 % (ref 3–12)
Monocytes Absolute: 0.6 10*3/uL (ref 0.1–1.0)
NEUTROS ABS: 3.8 10*3/uL (ref 1.7–7.7)
Neutrophils Relative %: 57 % (ref 43–77)
Platelets: 244 10*3/uL (ref 150–400)
RBC: 4.17 MIL/uL — ABNORMAL LOW (ref 4.22–5.81)
RDW: 14.7 % (ref 11.5–15.5)
WBC: 6.7 10*3/uL (ref 4.0–10.5)

## 2014-04-25 NOTE — Progress Notes (Signed)
Patient ID: Robert Stanton, male   DOB: October 11, 1941, 73 y.o.   MRN: [id number]  Annual Screening Comprehensive Examination  This very nice 73 y.o.MWM presents for complete physical.  Patient has been followed for HTN, COPD,   Prediabetes, Hyperlipidemia, and Vitamin D Deficiency.   HTN predates many years. Patient's BP has been controlled at home.Today's BP: 126/62 mmHg. Patient denies any cardiac symptoms as chest pain, palpitations, shortness of breath, dizziness or ankle swelling.   Patient's hyperlipidemia is not controlled with diet. Last cholesterol was not at goal with patient vowing intolerance to statins and having por dietary compliance. Lab Results  Component Value Date   CHOL 217* 02/21/2014   HDL 30* 02/21/2014   LDLCALC 142* 02/21/2014   TRIG 223* 02/21/2014   CHOLHDL 7.2 02/21/2014     Patient has prediabetes since 10/2008 with A1c 6.3%  and last A1c was  6.6% in May 2015.   Patient denies reactive hypoglycemic symptoms, visual blurring, diabetic polys or paresthesias.    Finally, patient has history of Vitamin D Deficiency of 22 in 2009 and last vitamin D was 48 in may 2015   Medication Sig  . albuterol (PROVENTIL) (2.5 MG/3ML) 0.083% nebulizer solution Take 2.5 mg by nebulization every 6 (six) hours as needed for wheezing or shortness of breath.  [name] aspirin 81 MG tablet Take 81 mg by mouth daily.  . benzonatate (TESSALON PERLES) 100 MG capsule Take 1 capsule (100 mg total) by mouth every 6 (six) hours as needed for cough.  . bumetanide (BUMEX) 2 MG tablet Take 2 mg by mouth 2 (two) times daily.  . Cholecalciferol (VITAMIN D PO) Take 5,000 Units by mouth daily.   . citalopram ( 40 MG tablet Take 40 mg  daily.   . clonazePAM (KLONOPIN) 1 MG  Take 1 mg  3 times daily as needed.   [name] VITAMIN B 12  Take 1,500 mcg by mouth daily.   [name] diltiazem (TIAZAC) 120 MG 24 hr  Take 120 mg daily.   . febuxostat (ULORIC) 40 MG tablet Take 40 mg  daily. For Gout  . fenofibrate  134 MG capsule Take  134 mg  daily before breakfast.   . ferrous sulfate 325 (65 FE) MG t Take 325 mg  daily.  . DUONEB) 0.5-2.5 (3) MG/3ML  Inhale into the lungs every 6 hours   . IMDUR 30 MG 24 hr tablet Take 30 mg  daily.   [name] levothyroxine  50 MCG tablet Take 50 mcg  daily before breakfast.  . loratadine  10 MG tablet Take 10 mg by  daily.  [name] NITROLINGUAL 0.4 MG/SPRAY  Place 1 spray under the tongue every 5 (five) minutes x 3 doses as needed fo  . pravastatin40 MG tablet Take 40 mg by mouth every morning.   . tiotropium (SPIRIVA)  capsule Place 18 mcg into inhaler and inhale daily as needed (for shortness of breath).   . warfarin  5 MG tablet Take 5 mg by mouth daily. Take as directed    Allergies  Allergen Reactions  . Chantix [Varenicline] Nausea And Vomiting  . Lyrica [Pregabalin]     Swelling, couldn't breathe  . Nsaids Other (See Comments)    Unknown   . Simvastatin     Pt said he had a "bleed out" after taking it  . Ziac [Bisoprolol-Hydrochlorothiazide]     fatigue   Past Medical History  Diagnosis Date  . Mechanical heart valve present     a.  Hx mechanical St Jude AVR 2000.  . Cataracts, bilateral   . OSA (obstructive sleep apnea)   . Permanent atrial fibrillation   . Ischemic cardiomyopathy   . History of echocardiogram 04/17/2013    EF 40-45%; LV hypertrophy; LV mild-mod dilated; AV regurg.; LA severely diated, RA mildly dilated  . History of Doppler ultrasound 04/05/2013    LEAs; small distal abd. aoritc aneuysm is stable; fem-pop graft to R leg has 50-69% blockage  . History of nuclear stress test 05/14/2011    Dipyridamole; moderate perfusion defect in Basal Infrioer & Mid Inferior region - consistent w/infarct or scar; global LV systolic function mildly reduced; EKG negative for ischemia; low risk scan  . COPD (chronic obstructive pulmonary disease)   . Hyperlipidemia   . Hypertension   . Kidney cysts   . CKD (chronic kidney disease), stage III   . Hiatal hernia   . GERD  (gastroesophageal reflux disease)   . Prediabetes   . Gout   . AAA (abdominal aortic aneurysm)   . Vitamin B deficiency   . Peripheral arterial disease     a. s/p aorto bifem BG;  b. 11/2013 PVA distal R Fem-fem stenosis;  c. 12/2013 PTA of dist R femoral bypass graft.  [name] CAD (coronary artery disease)     a. CABG '00. b. all SVGs occluded 2010. c. low risk Nuc 2012   . Chronic anticoagulation    Past Surgical History  Procedure Laterality Date  . Stents in kidneys    . Coronary artery bypass graft  2000    3 vessel; LIMA to LAD; RCA, OM  . Shunts in femoral arteries    . Cardioversion  8/282012  . Aortic valve replacement      St. Jude  . Femoral bypass    . Cardiac catheterization  11/25/2008    loss of 2/3 (RCA, OM) bypass grafts with patent internal mammary artery to LAD; large collateral filling of RCA ; osteal narrowing of circumflex of ~50%   Family History  Problem Relation Age of Onset  . Heart attack Father   . Heart attack Mother   . Lung cancer Sister     x 2  . Kidney cancer Sister   . Kidney disease Sister   . Cancer Brother     ?  . Stroke Brother    History   Social History  . Marital Status: Married    Spouse Name: N/A    Number of Children: 3  . Years of Education: N/A   Occupational History  . Not on file.   Social History Main Topics  . Smoking status: Current Every Day Smoker -- 1.00 packs/day for 40 years    Types: Cigarettes  . Smokeless tobacco: Never Used  . Alcohol Use: No  . Drug Use: No  . Sexual Activity: Not on file   Other Topics Concern  . Not on file   Social History Narrative   Lives in [place] with disabled wife, three sons, and grandson   Retired    ROS Constitutional: Denies fever, chills, weight loss/gain, headaches, insomnia, fatigue, night sweats or change in appetite. Eyes: Denies redness, blurred vision, diplopia, discharge, itchy or watery eyes.  ENT: Denies discharge, congestion, post nasal drip,  epistaxis, sore throat, earache, hearing loss, dental pain, Tinnitus, Vertigo, Sinus pain or snoring.  Cardio: Denies chest pain, palpitations, irregular heartbeat, syncope, dyspnea, diaphoresis, orthopnea, PND, claudication or edema Respiratory: denies cough, dyspnea, DOE, pleurisy, hoarseness, laryngitis or  wheezing.  Gastrointestinal: Denies dysphagia, heartburn, reflux, water brash, pain, cramps, nausea, vomiting, bloating, diarrhea, constipation, hematemesis, melena, hematochezia, jaundice or hemorrhoids Genitourinary: Denies dysuria, frequency, urgency, nocturia, hesitancy, discharge, hematuria or flank pain Musculoskeletal: Denies arthralgia, myalgia, stiffness, Jt. Swelling, pain, limp or strain/sprain. Skin: Denies puritis, rash, hives, warts, acne, eczema or change in skin lesion Neuro: No weakness, tremor, incoordination, spasms, paresthesia or pain Psychiatric: Denies confusion, memory loss or sensory loss Endocrine: Denies change in weight, skin, hair change, nocturia, and paresthesia, diabetic polys, visual blurring or hyper / hypo glycemic episodes.  Heme/Lymph: No excessive bleeding, bruising or enlarged lymph nodes.  Physical Exam  BP 126/62  P 64  T 98.6 F   R 18  Ht 5\' 8"    Wt 232 lb 9.6 oz   BMI 35.38 kg/m2  General Appearance: Over nourished &  in no apparent distress. Strong stench of tobacco. Eyes: PERRLA, EOMs, conjunctiva no swelling or erythema, normal fundi and vessels. Sinuses: No frontal/maxillary tenderness ENT/Mouth: EACs patent / TMs  nl. Nares clear without erythema, swelling, mucoid exudates. Oral hygiene is good. No erythema, swelling, or exudate. Tongue normal, non-obstructing. Tonsils not swollen or erythematous. Hearing normal.  Neck: Supple, thyroid normal. No bruits, nodes or JVD. Respiratory: Respiratory effort normal.  BS equal and clear bilateral without rales, rhonci, wheezing or stridor. Cardio: Prosthetic heart sounds  Sl irregular rate and  slow rhythm and no murmurs, rubs or gallops. Peripheral pulses are normal and equal bilaterally without edema. No aortic or femoral bruits. Chest: symmetric with normal excursions and percussion.  Abdomen: Flat, soft, with bowel sounds. Nontender, no guarding, rebound, hernias, masses, or organomegaly.  Lymphatics: Non tender without lymphadenopathy.  Genitourinary: No hernias.Testes nl. DRE - prostate nl for age - smooth & firm w/o nodules. Musculoskeletal: Full ROM all peripheral extremities, joint stability, 5/5 strength, and normal gait. Skin: Warm and dry without rashes, lesions, cyanosis, clubbing or  ecchymosis.  Neuro: Cranial nerves intact, reflexes equal bilaterally. Normal muscle tone, no cerebellar symptoms. Sensation intact.  Pysch: Awake and oriented X 3with normal affect, insight and judgment appropriate.   Assessment and Plan  1. Annual Screening Examination 2. Hypertension  3. Hyperlipidemia 4. Pre Diabetes 5. Vitamin D Deficiency 6. Stage 3 CKD   -Continue prudent diet as discussed, weight control, BP monitoring, regular exercise, and medications as discussed.    -Discussed med effects and SE's. Routine screening labs and tests as requested with regular follow-up as recommended.  Long discussion encouraging tobacco cessation.

## 2014-04-25 NOTE — Patient Instructions (Addendum)
Recommend the book "The END of DIETING" by Dr Baker Janus   and the book "The END of DIABETES " by Dr Excell Seltzer  At [email or web address] - get book & Audio CD's      Being diabetic has a  300% increased risk for heart attack, stroke, cancer, and alzheimer- type vascular dementia. It is very important that you work harder with diet by avoiding all foods that are white except chicken & fish. Avoid white rice (brown & wild rice is OK), white potatoes (sweetpotatoes in moderation is OK), White bread or wheat bread or anything made out of white flour like bagels, donuts, rolls, buns, biscuits, cakes, pastries, cookies, pizza crust, and pasta (made from white flour & egg whites) - vegetarian pasta or spinach or wheat pasta is OK. Multigrain breads like Arnold's or [employer], or multigrain sandwich thins or flatbreads.  Diet, exercise and weight loss can reverse and cure diabetes in the early stages.  Diet, exercise and weight loss is very important in the control and prevention of complications of diabetes which affects every system in your body, ie. Brain - dementia/stroke, eyes - glaucoma/blindness, heart - heart attack/heart failure, kidneys - dialysis, stomach - gastric paralysis, intestines - malabsorption, nerves - severe painful neuritis, circulation - gangrene & loss of a leg(s), and finally cancer and Alzheimers.    I recommend avoid fried & greasy foods,  sweets/candy, white rice (brown or wild rice or Quinoa is OK), white potatoes (sweet potatoes are OK) - anything made from white flour - bagels, doughnuts, rolls, buns, biscuits,white and wheat breads, pizza crust and traditional pasta made of white flour & egg white(vegetarian pasta or spinach or wheat pasta is OK).  Multi-grain bread is OK - like multi-grain flat bread or sandwich thins. Avoid alcohol in excess. Exercise is also important.    Eat all the vegetables you want - avoid meat, especially red meat and dairy - especially cheese.  Cheese  is the most concentrated form of trans-fats which is the worst thing to clog up our arteries. Veggie cheese is OK which can be found in the fresh produce section at [employer] or [employer] or [employer] Maintenance A healthy lifestyle and preventative care can promote health and wellness.  Maintain regular health, dental, and eye exams.  Eat a healthy diet. Foods like vegetables, fruits, whole grains, low-fat dairy products, and lean protein foods contain the nutrients you need and are low in calories. Decrease your intake of foods high in solid fats, added sugars, and salt. Get information about a proper diet from your health care provider, if necessary.  Regular physical exercise is one of the most important things you can do for your health. Most adults should get at least 150 minutes of moderate-intensity exercise (any activity that increases your heart rate and causes you to sweat) each week. In addition, most adults need muscle-strengthening exercises on 2 or more days a week.   Maintain a healthy weight. The body mass index (BMI) is a screening tool to identify possible weight problems. It provides an estimate of body fat based on height and weight. Your health care provider can find your BMI and can help you achieve or maintain a healthy weight. For males 20 years and older:  A BMI below 18.5 is considered underweight.  A BMI of 18.5 to 24.9 is normal.  A BMI of 25 to 29.9 is considered overweight.  A BMI of 30 and above is considered obese.  Maintain normal blood lipids and cholesterol by exercising and minimizing your intake of saturated fat. Eat a balanced diet with plenty of fruits and vegetables. Blood tests for lipids and cholesterol should begin at age 39 and be repeated every 5 years. If your lipid or cholesterol levels are high, you are over age 23, or you are at high risk for heart disease, you may need your cholesterol levels checked more frequently.Ongoing high  lipid and cholesterol levels should be treated with medicines if diet and exercise are not working.  If you smoke, find out from your health care provider how to quit. If you do not use tobacco, do not start.  Lung cancer screening is recommended for adults aged 63-80 years who are at high risk for developing lung cancer because of a history of smoking. A yearly low-dose CT scan of the lungs is recommended for people who have at least a 30-pack-year history of smoking and are current smokers or have quit within the past 15 years. A pack year of smoking is smoking an average of 1 pack of cigarettes a day for 1 year (for example, a 30-pack-year history of smoking could mean smoking 1 pack a day for 30 years or 2 packs a day for 15 years). Yearly screening should continue until the smoker has stopped smoking for at least 15 years. Yearly screening should be stopped for people who develop a health problem that would prevent them from having lung cancer treatment.  If you choose to drink alcohol, do not have more than 2 drinks per day. One drink is considered to be 12 oz (360 mL) of beer, 5 oz (150 mL) of wine, or 1.5 oz (45 mL) of liquor.  Avoid the use of street drugs. Do not share needles with anyone. Ask for help if you need support or instructions about stopping the use of drugs.  High blood pressure causes heart disease and increases the risk of stroke. Blood pressure should be checked at least every 1-2 years. Ongoing high blood pressure should be treated with medicines if weight loss and exercise are not effective.  If you are 8-15 years old, ask your health care provider if you should take aspirin to prevent heart disease.  Diabetes screening involves taking a blood sample to check your fasting blood sugar level. This should be done once every 3 years after age 44 if you are at a normal weight and without risk factors for diabetes. Testing should be considered at a younger age or be carried out  more frequently if you are overweight and have at least 1 risk factor for diabetes.  Colorectal cancer can be detected and often prevented. Most routine colorectal cancer screening begins at the age of 16 and continues through age 3. However, your health care provider may recommend screening at an earlier age if you have risk factors for colon cancer. On a yearly basis, your health care provider may provide home test kits to check for hidden blood in the stool. A small camera at the end of a tube may be used to directly examine the colon (sigmoidoscopy or colonoscopy) to detect the earliest forms of colorectal cancer. Talk to your health care provider about this at age 74 when routine screening begins. A direct exam of the colon should be repeated every 5-10 years through age 48, unless early forms of precancerous polyps or small growths are found.  People who are at an increased risk for hepatitis B should be screened for  this virus. You are considered at high risk for hepatitis B if:  You were born in a country where hepatitis B occurs often. Talk with your health care provider about which countries are considered high risk.  Your parents were born in a high-risk country and you have not received a shot to protect against hepatitis B (hepatitis B vaccine).  You have HIV or AIDS.  You use needles to inject street drugs.  You live with, or have sex with, someone who has hepatitis B.  You are a man who has sex with other men (MSM).  You get hemodialysis treatment.  You take certain medicines for conditions like cancer, organ transplantation, and autoimmune conditions.  Hepatitis C blood testing is recommended for all people born from 1945 through 1965 and any individual with known risk factors for hepatitis C.  Healthy men should no longer receive prostate-specific antigen (PSA) blood tests as part of routine cancer screening. Talk to your health care provider about prostate cancer  screening.  Testicular cancer screening is not recommended for adolescents or adult males who have no symptoms. Screening includes self-exam, a health care provider exam, and other screening tests. Consult with your health care provider about any symptoms you have or any concerns you have about testicular cancer.  Practice safe sex. Use condoms and avoid high-risk sexual practices to reduce the spread of sexually transmitted infections (STIs).  You should be screened for STIs, including gonorrhea and chlamydia if:  You are sexually active and are younger than 24 years.  You are older than 24 years, and your health care provider tells you that you are at risk for this type of infection.  Your sexual activity has changed since you were last screened, and you are at an increased risk for chlamydia or gonorrhea. Ask your health care provider if you are at risk.  If you are at risk of being infected with HIV, it is recommended that you take a prescription medicine daily to prevent HIV infection. This is called pre-exposure prophylaxis (PrEP). You are considered at risk if:  You are a man who has sex with other men (MSM).  You are a heterosexual man who is sexually active with multiple partners.  You take drugs by injection.  You are sexually active with a partner who has HIV.  Talk with your health care provider about whether you are at high risk of being infected with HIV. If you choose to begin PrEP, you should first be tested for HIV. You should then be tested every 3 months for as long as you are taking PrEP.  Use sunscreen. Apply sunscreen liberally and repeatedly throughout the day. You should seek shade when your shadow is shorter than you. Protect yourself by wearing long sleeves, pants, a wide-brimmed hat, and sunglasses year round whenever you are outdoors.  Tell your health care provider of new moles or changes in moles, especially if there is a change in shape or color. Also, tell  your health care provider if a mole is larger than the size of a pencil eraser.  A one-time screening for abdominal aortic aneurysm (AAA) and surgical repair of large AAAs by ultrasound is recommended for men aged 65-75 years who are current or former smokers.  Stay current with your vaccines (immunizations). Document Released: 04/01/2008 Document Revised: 10/09/2013 Document Reviewed: 03/01/2011 Exit[employer] Patient Information 2015 [employer]. This information is not intended to replace advice given to you by your health care provider. Make sure you discuss   any questions you have with your health care provider.  

## 2014-04-26 ENCOUNTER — Other Ambulatory Visit: Payer: Self-pay | Admitting: Internal Medicine

## 2014-04-26 LAB — BASIC METABOLIC PANEL WITH GFR
BUN: 15 mg/dL (ref 6–23)
CHLORIDE: 100 meq/L (ref 96–112)
CO2: 28 meq/L (ref 19–32)
CREATININE: 1.61 mg/dL — AB (ref 0.50–1.35)
Calcium: 9 mg/dL (ref 8.4–10.5)
GFR, EST NON AFRICAN AMERICAN: 42 mL/min — AB
GFR, Est African American: 49 mL/min — ABNORMAL LOW
GLUCOSE: 84 mg/dL (ref 70–99)
POTASSIUM: 4 meq/L (ref 3.5–5.3)
Sodium: 136 mEq/L (ref 135–145)

## 2014-04-26 LAB — HEPATIC FUNCTION PANEL
ALBUMIN: 4.1 g/dL (ref 3.5–5.2)
ALK PHOS: 69 U/L (ref 39–117)
ALT: 12 U/L (ref 0–53)
AST: 19 U/L (ref 0–37)
BILIRUBIN INDIRECT: 0.5 mg/dL (ref 0.2–1.2)
Bilirubin, Direct: 0.1 mg/dL (ref 0.0–0.3)
TOTAL PROTEIN: 7 g/dL (ref 6.0–8.3)
Total Bilirubin: 0.6 mg/dL (ref 0.2–1.2)

## 2014-04-26 LAB — URINALYSIS, MICROSCOPIC ONLY
BACTERIA UA: NONE SEEN
Casts: NONE SEEN
Crystals: NONE SEEN
Squamous Epithelial / LPF: NONE SEEN

## 2014-04-26 LAB — MICROALBUMIN / CREATININE URINE RATIO
CREATININE, URINE: 54.6 mg/dL
MICROALB UR: 8.86 mg/dL — AB (ref 0.00–1.89)
MICROALB/CREAT RATIO: 162.3 mg/g — AB (ref 0.0–30.0)

## 2014-04-26 LAB — URIC ACID: Uric Acid, Serum: 9.5 mg/dL — ABNORMAL HIGH (ref 4.0–7.8)

## 2014-04-26 LAB — TSH: TSH: 0.757 u[IU]/mL (ref 0.350–4.500)

## 2014-04-26 LAB — VITAMIN B12: VITAMIN B 12: 737 pg/mL (ref 211–911)

## 2014-04-26 LAB — MAGNESIUM: Magnesium: 1.7 mg/dL (ref 1.5–2.5)

## 2014-04-26 LAB — PROTIME-INR
INR: 2.65 — ABNORMAL HIGH (ref <1.50)
Prothrombin Time: 28.3 seconds — ABNORMAL HIGH (ref 11.6–15.2)

## 2014-04-26 LAB — INSULIN, FASTING: INSULIN FASTING, SERUM: 39 u[IU]/mL — AB (ref 3–28)

## 2014-04-26 MED ORDER — ALLOPURINOL 300 MG PO TABS: 300.0000 mg | ORAL_TABLET | Freq: Every day | ORAL | Status: DC

## 2014-04-27 ENCOUNTER — Other Ambulatory Visit: Payer: Self-pay | Admitting: Physician Assistant

## 2014-05-02 ENCOUNTER — Ambulatory Visit (INDEPENDENT_AMBULATORY_CARE_PROVIDER_SITE_OTHER): Payer: Medicare Other | Admitting: *Deleted

## 2014-05-02 DIAGNOSIS — Z7901 Long term (current) use of anticoagulants: Secondary | ICD-10-CM

## 2014-05-02 NOTE — Progress Notes (Signed)
Patient ID: Robert Stanton, male   DOB: 1941/06/15, 73 y.o.   MRN: [id number] Patient presents today for recheck of PT/INR.  Patient states he is currently taking 5 mg Coumadin times 5 days and 2.5 mg times two days.

## 2014-05-03 LAB — PROTIME-INR
INR: 2.08 — ABNORMAL HIGH (ref <1.50)
Prothrombin Time: 23.4 seconds — ABNORMAL HIGH (ref 11.6–15.2)

## 2014-05-08 ENCOUNTER — Other Ambulatory Visit: Payer: Self-pay | Admitting: Internal Medicine

## 2014-05-16 ENCOUNTER — Other Ambulatory Visit: Payer: Self-pay | Admitting: Emergency Medicine

## 2014-05-27 ENCOUNTER — Encounter: Payer: Self-pay | Admitting: Physician Assistant

## 2014-05-27 ENCOUNTER — Ambulatory Visit (INDEPENDENT_AMBULATORY_CARE_PROVIDER_SITE_OTHER): Payer: Medicare Other | Admitting: Physician Assistant

## 2014-05-27 VITALS — BP 132/70 | HR 64 | Temp 98.2°F | Resp 16 | Wt 235.0 lb

## 2014-05-27 DIAGNOSIS — Z7901 Long term (current) use of anticoagulants: Secondary | ICD-10-CM

## 2014-05-27 DIAGNOSIS — Z79899 Other long term (current) drug therapy: Secondary | ICD-10-CM

## 2014-05-27 LAB — CBC WITH DIFFERENTIAL/PLATELET
Basophils Absolute: 0.1 10*3/uL (ref 0.0–0.1)
Basophils Relative: 1 % (ref 0–1)
EOS ABS: 0.2 10*3/uL (ref 0.0–0.7)
EOS PCT: 3 % (ref 0–5)
HCT: 36.8 % — ABNORMAL LOW (ref 39.0–52.0)
Hemoglobin: 12.1 g/dL — ABNORMAL LOW (ref 13.0–17.0)
LYMPHS ABS: 1.9 10*3/uL (ref 0.7–4.0)
Lymphocytes Relative: 29 % (ref 12–46)
MCH: 29.7 pg (ref 26.0–34.0)
MCHC: 32.9 g/dL (ref 30.0–36.0)
MCV: 90.4 fL (ref 78.0–100.0)
Monocytes Absolute: 0.7 10*3/uL (ref 0.1–1.0)
Monocytes Relative: 11 % (ref 3–12)
Neutro Abs: 3.6 10*3/uL (ref 1.7–7.7)
Neutrophils Relative %: 56 % (ref 43–77)
PLATELETS: 249 10*3/uL (ref 150–400)
RBC: 4.07 MIL/uL — AB (ref 4.22–5.81)
RDW: 14.8 % (ref 11.5–15.5)
WBC: 6.5 10*3/uL (ref 4.0–10.5)

## 2014-05-27 NOTE — Progress Notes (Signed)
Coumadin follow up  Patient is on Coumadin for Primary Diagnosis: Mechanical heart valve/PAF Patient's last INR is  Lab Results  Component Value Date   INR 2.08* 05/02/2014   INR 2.65* 04/25/2014   INR 3.31* 03/25/2014    Patient denies SOB, CP, dizziness, nose bleeds, easy bleeding, and blood in stool/urine. His coumadin dose was not changed last visit, but goal is 2.5-3.5. 5mg  5 days a week and 2 days a week 2.5mg . Has not missed any doses.  He complains of his weight being increase, edema, and increasing DOE, denies orthopnea, PND. He has been taking 1 bumex daily.  Wt Readings from Last 3 Encounters:  05/27/14 235 lb (106.595 kg)  04/25/14 232 lb 9.6 oz (105.507 kg)  03/25/14 231 lb (104.781 kg)      Current Outpatient Prescriptions on File Prior to Visit  Medication Sig Dispense Refill  . albuterol (PROVENTIL) (2.5 MG/3ML) 0.083% nebulizer solution Take 2.5 mg by nebulization every 6 (six) hours as needed for wheezing or shortness of breath.      . allopurinol (ZYLOPRIM) 300 MG tablet Take 1 tablet (300 mg total) by mouth daily. To prevent gout  90 tablet  99  . aspirin 81 MG tablet Take 81 mg by mouth daily.      . benzonatate (TESSALON PERLES) 100 MG capsule Take 1 capsule (100 mg total) by mouth every 6 (six) hours as needed for cough.  42 capsule  5  . bumetanide (BUMEX) 2 MG tablet Take 2 mg by mouth 2 (two) times daily.      . Cholecalciferol (VITAMIN D PO) Take 5,000 Units by mouth daily.       . citalopram (CELEXA) 40 MG tablet TAKE 1 TABLET BY MOUTH EVERY DAY  90 tablet  1  . clonazePAM (KLONOPIN) 1 MG tablet TAKE 1 TABLET BY MOUTH AT BEDTIME AS NEEDED  90 tablet  0  . Cyanocobalamin (VITAMIN B 12 PO) Take 1,500 mcg by mouth daily.       [name] diltiazem (TIAZAC) 120 MG 24 hr capsule Take 120 mg by mouth daily.       . fenofibrate micronized (LOFIBRA) 134 MG capsule Take 134 mg by mouth daily before breakfast.       . ferrous sulfate 325 (65 FE) MG tablet Take 325 mg by mouth  daily.      [name] ipratropium-albuterol (DUONEB) 0.5-2.5 (3) MG/3ML SOLN Inhale 3 mLs into the lungs every 6 (six) hours as needed.       . isosorbide mononitrate (IMDUR) 30 MG 24 hr tablet Take 30 mg by mouth daily.       [name] levothyroxine (SYNTHROID, LEVOTHROID) 50 MCG tablet TAKE ONE TABLET BY MOUTH ONCE DAILY FOR THYROID  90 tablet  2  . loratadine (CLARITIN) 10 MG tablet Take 10 mg by mouth daily.      . nitroGLYCERIN (NITROLINGUAL) 0.4 MG/SPRAY spray Place 1 spray under the tongue every 5 (five) minutes x 3 doses as needed for chest pain.      . pravastatin (PRAVACHOL) 40 MG tablet Take 40 mg by mouth every morning.       . tiotropium (SPIRIVA) 18 MCG inhalation capsule Place 18 mcg into inhaler and inhale daily as needed (for shortness of breath).       . warfarin (COUMADIN) 5 MG tablet Take 5 mg by mouth daily. Take as directed       No current facility-administered medications on file prior to visit.   Past  Medical History  Diagnosis Date  . Mechanical heart valve present     a. Hx mechanical [employer] Jude AVR 2000.  . Cataracts, bilateral   . OSA (obstructive sleep apnea)   . Permanent atrial fibrillation   . Ischemic cardiomyopathy   . History of echocardiogram 04/17/2013    EF 40-45%; LV hypertrophy; LV mild-mod dilated; AV regurg.; LA severely diated, RA mildly dilated  . History of Doppler ultrasound 04/05/2013    LEAs; small distal abd. aoritc aneuysm is stable; fem-pop graft to R leg has 50-69% blockage  . History of nuclear stress test 05/14/2011    Dipyridamole; moderate perfusion defect in Basal Infrioer & Mid Inferior region - consistent w/infarct or scar; global LV systolic function mildly reduced; EKG negative for ischemia; low risk scan  . COPD (chronic obstructive pulmonary disease)   . Hyperlipidemia   . Hypertension   . Kidney cysts   . CKD (chronic kidney disease), stage III   . Hiatal hernia   . GERD (gastroesophageal reflux disease)   . Prediabetes   . Gout   . AAA  (abdominal aortic aneurysm)   . Vitamin B deficiency   . Peripheral arterial disease     a. s/p aorto bifem BG;  b. 11/2013 PVA distal R Fem-fem stenosis;  c. 12/2013 PTA of dist R femoral bypass graft.  [name] CAD (coronary artery disease)     a. CABG '00. b. all SVGs occluded 2010. c. low risk Nuc 2012   . Chronic anticoagulation    Allergies  Allergen Reactions  . Chantix [Varenicline] Nausea And Vomiting  . Lyrica [Pregabalin]     Swelling, couldn't breathe  . Nsaids Other (See Comments)    Unknown   . Simvastatin     Pt said he had a "bleed out" after taking it  . Ziac [Bisoprolol-Hydrochlorothiazide]     fatigue    ROS Constitutional: Denies fever, chills, headaches, fatigue. Cardio: + edema Denies chest pain, palpitations, irregular heartbeat, syncope, dyspnea, diaphoresis, orthopnea, PND, claudication Respiratory: + DOE denies cough, dyspnea, pleurisy, hoarseness, laryngitis, wheezing.  Gastrointestinal: Denies dysphagia, heartburn, reflux, pain, cramps, nausea, diarrhea, constipation, hematemesis, melena, hematochezia Genitourinary: Denies dysuria, frequency, hematuria, flank pain Musculoskeletal: Denies arthralgia, myalgia, stiffness, Jt. Swelling, pain, limp, strain/sprain. Skin: Denies rash, ecchymosis, petechial. Neuro: Denies Weakness, tremor, incoordination, spasms, paresthesia, pain Heme/Lymph: Denies Excessive bleeding, bruising, enlarged lymph nodes  Physical: Blood pressure 132/70, pulse 64, temperature 98.2 F (36.8 C), resp. rate 16, weight 235 lb (106.595 kg). Filed Weights   05/27/14 1038  Weight: 235 lb (106.595 kg)    General Appearance: Well nourished, in no apparent distress. ENT/Mouth: Nares clear with no erythema, swelling, mucus on turbinates. No ulcers, cracking, on lips. No erythema, swelling, or exudate on post pharynx.  Neck: Supple, thyroid normal.  Respiratory: CTAB, 98% RA, no rales/rhonchi  Cardio: Irregular, irregular rhythm, with mechanical  click, without rubs or gallops. 2-3+ edema Abdomen: Soft, with bowl sounds. Non tender, no guarding, rebound, hernias, masses, or organomegaly.  Skin: Warm, dry without rashes, lesions, ecchymosis.  Neuro: Unremarkable  Assessment and plan: Chronic anticoagulation- check INR and will adjust medication according to labs.  Discussed if patient falls to immediately contact office or go to ER. Discussed foods that can increase or decrease Coumadin levels. Patient understands to call the office before starting a new medication. Follow up in one month.   CHF- Continue to take the bumex daily. If any increasing shortness of breath, swelling, or chest pressure go to  ER immediately. Decrease your sodium intake to less than 2000 mg daily, decrease your fluid intake to less than 2 L daily, elevate legs, weight yourself daily and please remember to always increase your potassium intake with any increase of your fluid pill. Follow up in 1 month

## 2014-05-27 NOTE — Patient Instructions (Signed)
Do the following things EVERYDAY: 1) Weigh yourself in the morning before breakfast. Write it down and keep it in a log. 2) Take your medicines as prescribed 3) Eat low salt foods-Limit salt (sodium) to 2000 mg per day.  4) Stay as active as you can everyday 5) Limit all fluids for the day to less than 2 liters  Heart Failure Heart failure means your heart has trouble pumping blood. This makes it hard for your body to work well. Heart failure is usually a long-term (chronic) condition. You must take good care of yourself and follow your doctor's treatment plan. HOME CARE  Take your heart medicine as told by your doctor.  Do not stop taking medicine unless your doctor tells you to.  Do not skip any dose of medicine.  Refill your medicines before they run out.  Take other medicines only as told by your doctor or pharmacist.  Stay active if told by your doctor. The elderly and people with severe heart failure should talk with a doctor about physical activity.  Eat heart-healthy foods. Choose foods that are without trans fat and are low in saturated fat, cholesterol, and salt (sodium). This includes fresh or frozen fruits and vegetables, fish, lean meats, fat-free or low-fat dairy foods, whole grains, and high-fiber foods. Lentils and dried peas and beans (legumes) are also good choices.  Limit salt if told by your doctor.  Cook in a healthy way. Roast, grill, broil, bake, poach, steam, or stir-fry foods.  Limit fluids as told by your doctor.  Weigh yourself every morning. Do this after you pee (urinate) and before you eat breakfast. Write down your weight to give to your doctor.  Take your blood pressure and write it down if your doctor tells you to.  Ask your doctor how to check your pulse. Check your pulse as told.  Lose weight if told by your doctor.  Stop smoking or chewing tobacco. Do not use gum or patches that help you quit without your doctor's approval.  Schedule and go  to doctor visits as told.  Nonpregnant women should have no more than 1 drink a day. Men should have no more than 2 drinks a day. Talk to your doctor about drinking alcohol.  Stop illegal drug use.  Stay current with shots (immunizations).  Manage your health conditions as told by your doctor.  Learn to manage your stress.  Rest when you are tired.  If it is really hot outside:  Avoid intense activities.  Use air conditioning or fans, or get in a cooler place.  Avoid caffeine and alcohol.  Wear loose-fitting, lightweight, and light-colored clothing.  If it is really cold outside:  Avoid intense activities.  Layer your clothing.  Wear mittens or gloves, a hat, and a scarf when going outside.  Avoid alcohol.  Learn about heart failure and get support as needed.  Get help to maintain or improve your quality of life and your ability to care for yourself as needed. GET HELP IF:   You gain 03 lb/1.4 kg or more in 1 day or 05 lb/2.3 kg in a week.  You are more short of breath than usual.  You cannot do your normal activities.  You tire easily.  You cough more than normal, especially with activity.  You have any or more puffiness (swelling) in areas such as your hands, feet, ankles, or belly (abdomen).  You cannot sleep because it is hard to breathe.  You feel like your heart   is beating fast (palpitations).  You get dizzy or light-headed when you stand up. GET HELP RIGHT AWAY IF:   You have trouble breathing.  There is a change in mental status, such as becoming less alert or not being able to focus.  You have chest pain or discomfort.  You faint. MAKE SURE YOU:   Understand these instructions.  Will watch your condition.  Will get help right away if you are not doing well or get worse. Document Released: 07/13/2008 Document Revised: 02/18/2014 Document Reviewed: 11/20/2012 [employer] Patient Information 2015 [employer]. This information is not  intended to replace advice given to you by your health care provider. Make sure you discuss any questions you have with your health care provider.  

## 2014-05-28 LAB — BASIC METABOLIC PANEL WITH GFR
BUN: 24 mg/dL — ABNORMAL HIGH (ref 6–23)
CALCIUM: 9 mg/dL (ref 8.4–10.5)
CO2: 31 meq/L (ref 19–32)
CREATININE: 1.85 mg/dL — AB (ref 0.50–1.35)
Chloride: 102 mEq/L (ref 96–112)
GFR, EST AFRICAN AMERICAN: 41 mL/min — AB
GFR, Est Non African American: 36 mL/min — ABNORMAL LOW
Glucose, Bld: 94 mg/dL (ref 70–99)
Potassium: 4.4 mEq/L (ref 3.5–5.3)
SODIUM: 139 meq/L (ref 135–145)

## 2014-05-28 LAB — PROTIME-INR
INR: 2.5 — ABNORMAL HIGH (ref <1.50)
Prothrombin Time: 27 seconds — ABNORMAL HIGH (ref 11.6–15.2)

## 2014-05-28 LAB — MAGNESIUM: MAGNESIUM: 1.9 mg/dL (ref 1.5–2.5)

## 2014-06-02 ENCOUNTER — Other Ambulatory Visit: Payer: Self-pay | Admitting: Emergency Medicine

## 2014-06-17 ENCOUNTER — Other Ambulatory Visit: Payer: Self-pay | Admitting: Internal Medicine

## 2014-07-01 ENCOUNTER — Ambulatory Visit (INDEPENDENT_AMBULATORY_CARE_PROVIDER_SITE_OTHER): Payer: Medicare Other | Admitting: Internal Medicine

## 2014-07-01 ENCOUNTER — Encounter: Payer: Self-pay | Admitting: Internal Medicine

## 2014-07-01 VITALS — BP 122/66 | HR 60 | Temp 98.1°F | Resp 16 | Ht 68.0 in | Wt 230.8 lb

## 2014-07-01 DIAGNOSIS — Z79899 Other long term (current) drug therapy: Secondary | ICD-10-CM

## 2014-07-01 DIAGNOSIS — E782 Mixed hyperlipidemia: Secondary | ICD-10-CM

## 2014-07-01 DIAGNOSIS — Z7901 Long term (current) use of anticoagulants: Secondary | ICD-10-CM

## 2014-07-01 DIAGNOSIS — E1129 Type 2 diabetes mellitus with other diabetic kidney complication: Secondary | ICD-10-CM

## 2014-07-01 DIAGNOSIS — E539 Vitamin B deficiency, unspecified: Secondary | ICD-10-CM

## 2014-07-01 DIAGNOSIS — E559 Vitamin D deficiency, unspecified: Secondary | ICD-10-CM

## 2014-07-01 DIAGNOSIS — I10 Essential (primary) hypertension: Secondary | ICD-10-CM

## 2014-07-01 LAB — CBC WITH DIFFERENTIAL/PLATELET
BASOS PCT: 1 % (ref 0–1)
Basophils Absolute: 0.1 10*3/uL (ref 0.0–0.1)
EOS ABS: 0.3 10*3/uL (ref 0.0–0.7)
EOS PCT: 4 % (ref 0–5)
HCT: 36.8 % — ABNORMAL LOW (ref 39.0–52.0)
Hemoglobin: 12.4 g/dL — ABNORMAL LOW (ref 13.0–17.0)
Lymphocytes Relative: 29 % (ref 12–46)
Lymphs Abs: 1.9 10*3/uL (ref 0.7–4.0)
MCH: 29.7 pg (ref 26.0–34.0)
MCHC: 33.7 g/dL (ref 30.0–36.0)
MCV: 88.2 fL (ref 78.0–100.0)
MONOS PCT: 10 % (ref 3–12)
Monocytes Absolute: 0.7 10*3/uL (ref 0.1–1.0)
Neutro Abs: 3.6 10*3/uL (ref 1.7–7.7)
Neutrophils Relative %: 56 % (ref 43–77)
PLATELETS: 247 10*3/uL (ref 150–400)
RBC: 4.17 MIL/uL — AB (ref 4.22–5.81)
RDW: 15 % (ref 11.5–15.5)
WBC: 6.5 10*3/uL (ref 4.0–10.5)

## 2014-07-01 LAB — PROTIME-INR
INR: 2.87 — ABNORMAL HIGH (ref <1.50)
Prothrombin Time: 30.1 seconds — ABNORMAL HIGH (ref 11.6–15.2)

## 2014-07-01 NOTE — Progress Notes (Signed)
Patient ID: Robert Stanton, male   DOB: 11/21/1940, 73 y.o.   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    Patient is treated for HTN & BP has been controlled at home. Today's BP: 122/66 mmHg. Patient has ASCAD and is s/p CABG & Mech AoVR and he has been on coumadin for A.fib w/o SE's or complications.  Patient denies any recent cardiac type chest pain, palpitations, dyspnea/orthopnea/PND, dizziness, claudication, or dependent edema.   Hyperlipidemia is not controlled with diet & meds. Patient denies myalgias or other med SE's. Last Lipids were not at goal -  Cholesterol, Total 217*; HDL Cholesterol by NMR 30*; LDL (calc) 142*; Triglycerides 223 on 02/21/2014.  Also, the patient has history of T2_NIDDM & Stage 3 CKD (GFR 36 ml/min) and patient denies any symptoms of reactive hypoglycemia, diabetic polys, paresthesias or visual blurring.  He has attempted management with diet in lieu of starting more meds. Last A1c was  6.6% on 02/21/2014.    Further, Patient has history of Vitamin D Deficiency and patient supplements vitamin D without any suspected side-effects. Last vitamin D was  48 on 02/21/2014.   Medication List   albuterol (2.5 MG/3ML) 0.083% nebulizer solution  Commonly known as:  PROVENTIL  Take 2.5 mg by nebulization every 6 (six) hours as needed for wheezing or shortness of breath.     allopurinol 300 MG tablet  Commonly known as:  ZYLOPRIM  Take 1 tablet (300 mg total) by mouth daily. To prevent gout     aspirin 81 MG tablet  Take 81 mg by mouth daily.     benzonatate 100 MG capsule  Commonly known as:  TESSALON PERLES  Take 1 capsule (100 mg total) by mouth every 6 (six) hours as needed for cough.     bumetanide 2 MG tablet  Commonly known as:  BUMEX  Take 2 mg by mouth 2 (two) times daily.     citalopram 40 MG tablet  Commonly known as:   CELEXA  TAKE 1 TABLET BY MOUTH EVERY DAY     clonazePAM 1 MG tablet  Commonly known as:  KLONOPIN  TAKE 1 TABLET BY MOUTH AT BEDTIME AS NEEDED     diltiazem 120 MG 24 hr capsule  Commonly known as:  TIAZAC  Take 120 mg by mouth daily.     fenofibrate micronized 134 MG capsule  Commonly known as:  LOFIBRA  TAKE 1 CAPSULE EVERY DAY     ferrous sulfate 325 (65 FE) MG tablet  Take 325 mg by mouth daily.     ipratropium-albuterol 0.5-2.5 (3) MG/3ML Soln  Commonly known as:  DUONEB  Inhale 3 mLs into the lungs every 6 (six) hours as needed.     isosorbide mononitrate 30 MG 24 hr tablet  Commonly known as:  IMDUR  TAKE 1 TABLET BY MOUTH EVERY DAY     levothyroxine 50 MCG tablet  Commonly known as:  SYNTHROID, LEVOTHROID  TAKE ONE TABLET BY MOUTH ONCE DAILY FOR THYROID     loratadine 10 MG tablet  Commonly known as:  CLARITIN  Take 10 mg by mouth daily.     nitroGLYCERIN 0.4 MG/SPRAY spray  Commonly known as:  NITROLINGUAL  Place 1 spray under the tongue every 5 (five) minutes x 3 doses as needed for chest pain.     pravastatin 40 MG tablet  Commonly known as:  PRAVACHOL  Take 40 mg by mouth every morning.     tiotropium 18 MCG inhalation capsule  Commonly known as:  SPIRIVA  Place 18 mcg into inhaler and inhale daily as needed (for shortness of breath).     VITAMIN B 12 PO  Take 1,500 mcg by mouth daily.     VITAMIN D PO  Take 5,000 Units by mouth daily.     warfarin 5 MG tablet  Commonly known as:  COUMADIN  Take 5 mg by mouth daily. Take as directed     Allergies  Allergen Reactions  . Chantix [Varenicline] Nausea And Vomiting  . Lyrica [Pregabalin]     Swelling, couldn't breathe  . Nsaids Other (See Comments)    Unknown   . Simvastatin     Pt said he had a "bleed out" after taking it  . Ziac [Bisoprolol-Hydrochlorothiazide]     fatigue   PMHx:   Past Medical History  Diagnosis Date  . Mechanical heart valve present     a. Hx mechanical St Jude AVR  2000.  . Cataracts, bilateral   . OSA (obstructive sleep apnea)   . Permanent atrial fibrillation   . Ischemic cardiomyopathy   . History of echocardiogram 04/17/2013    EF 40-45%; LV hypertrophy; LV mild-mod dilated; AV regurg.; LA severely diated, RA mildly dilated  . History of Doppler ultrasound 04/05/2013    LEAs; small distal abd. aoritc aneuysm is stable; fem-pop graft to R leg has 50-69% blockage  . History of nuclear stress test 05/14/2011    Dipyridamole; moderate perfusion defect in Basal Infrioer & Mid Inferior region - consistent w/infarct or scar; global LV systolic function mildly reduced; EKG negative for ischemia; low risk scan  . COPD (chronic obstructive pulmonary disease)   . Hyperlipidemia   . Hypertension   . Kidney cysts   . CKD (chronic kidney disease), stage III   . Hiatal hernia   . GERD (gastroesophageal reflux disease)   . Prediabetes   . Gout   . AAA (abdominal aortic aneurysm)   . Vitamin B deficiency   . Peripheral arterial disease     a. s/p aorto bifem BG;  b. 11/2013 PVA distal R Fem-fem stenosis;  c. 12/2013 PTA of dist R femoral bypass graft.  [name] CAD (coronary artery disease)     a. CABG '00. b. all SVGs occluded 2010. c. low risk Nuc 2012   . Chronic anticoagulation    FHx:    Reviewed / unchanged SHx:    Reviewed / unchanged  Systems Review:  Constitutional: Denies fever, chills, wt changes, headaches, insomnia, fatigue, night sweats, change in appetite. Eyes: Denies redness, blurred vision, diplopia, discharge, itchy, watery eyes.  ENT: Denies discharge, congestion, post nasal drip, epistaxis, sore throat, earache, hearing loss, dental pain, tinnitus, vertigo, sinus pain, snoring.  CV: Denies chest pain, palpitations, irregular heartbeat, syncope, dyspnea, diaphoresis, orthopnea, PND, claudication or edema. Respiratory: denies cough, dyspnea, DOE, pleurisy, hoarseness, laryngitis, wheezing.  Gastrointestinal: Denies dysphagia, odynophagia,  heartburn, reflux, water brash, abdominal pain or cramps, nausea, vomiting, bloating, diarrhea, constipation, hematemesis, melena, hematochezia  or hemorrhoids. Genitourinary: Denies dysuria, frequency, urgency, nocturia, hesitancy, discharge, hematuria or flank pain. Musculoskeletal: Denies arthralgias, myalgias, stiffness, jt. swelling, pain, limping or strain/sprain.  Skin: Denies pruritus, rash, hives, warts, acne, eczema or change in skin lesion(s). Neuro: No weakness, tremor, incoordination, spasms, paresthesia or pain. Psychiatric: Denies confusion, memory loss or sensory loss. Endo: Denies change in weight, skin  or hair change.  Heme/Lymph: No excessive bleeding, bruising or enlarged lymph nodes.  Exam:  BP 122/66  Pulse 60  Temp(Src) 98.1 F (36.7 C) (Temporal)  Resp 16  Ht 5\' 8"  (1.727 m)  Wt 230 lb 12.8 oz (104.69 kg)  BMI 35.10 kg/m2  Appears well nourished and in no distress. Eyes: PERRLA, EOMs, conjunctiva no swelling or erythema. Sinuses: No frontal/maxillary tenderness ENT/Mouth: EAC's clear, TM's nl w/o erythema, bulging. Nares clear w/o erythema, swelling, exudates. Oropharynx clear without erythema or exudates. Oral hygiene is good. Tongue normal, non obstructing. Hearing intact.  Neck: Supple. Thyroid nl. Car 2+/2+ without bruits, nodes or JVD. Chest: Respirations nl with BS clear & equal w/o rales, rhonchi, wheezing or stridor.  Cor: Heart sounds normal w/ regular rate and rhythm without sig. murmurs, gallops, clicks, or rubs. Peripheral pulses normal and equal  without edema.  Abdomen: Soft & bowel sounds normal. Non-tender w/o guarding, rebound, hernias, masses, or organomegaly.  Lymphatics: Unremarkable.  Musculoskeletal: Full ROM all peripheral extremities, joint stability, 5/5 strength, and normal gait.  Skin: Warm, dry without exposed rashes, lesions or ecchymosis apparent.  Neuro: Cranial nerves intact, reflexes equal bilaterally. Sensory-motor testing  grossly intact. Tendon reflexes grossly intact.  Pysch: Alert & oriented x 3.  Insight and judgement nl & appropriate. No ideations.  Assessment and Plan:  1. Hypertension - Continue monitor blood pressure at home. Continue diet/meds same.  2. Hyperlipidemia - Continue diet/meds, exercise,& lifestyle modifications. Continue monitor periodic cholesterol/liver & renal functions   3.  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 Monitor appropriate labs.  4. Vitamin D Deficiency - Continue supplementation.  5. ASCAD, s/p AoVR, ASPVD, chAfib -   6. OSA/CPAP -   Recommended regular exercise, BP monitoring, weight control, and discussed med and SE's. Recommended labs to assess and monitor clinical status. Further disposition pending results of labs.

## 2014-07-01 NOTE — Patient Instructions (Signed)

## 2014-07-02 LAB — HEPATIC FUNCTION PANEL
ALBUMIN: 4 g/dL (ref 3.5–5.2)
ALT: 15 U/L (ref 0–53)
AST: 21 U/L (ref 0–37)
Alkaline Phosphatase: 88 U/L (ref 39–117)
BILIRUBIN DIRECT: 0.1 mg/dL (ref 0.0–0.3)
BILIRUBIN TOTAL: 0.5 mg/dL (ref 0.2–1.2)
Indirect Bilirubin: 0.4 mg/dL (ref 0.2–1.2)
Total Protein: 6.9 g/dL (ref 6.0–8.3)

## 2014-07-02 LAB — MAGNESIUM: Magnesium: 1.8 mg/dL (ref 1.5–2.5)

## 2014-07-02 LAB — BASIC METABOLIC PANEL WITH GFR
BUN: 24 mg/dL — AB (ref 6–23)
CALCIUM: 8.9 mg/dL (ref 8.4–10.5)
CO2: 27 mEq/L (ref 19–32)
CREATININE: 1.7 mg/dL — AB (ref 0.50–1.35)
Chloride: 98 mEq/L (ref 96–112)
GFR, Est African American: 46 mL/min — ABNORMAL LOW
GFR, Est Non African American: 40 mL/min — ABNORMAL LOW
GLUCOSE: 85 mg/dL (ref 70–99)
Potassium: 3.9 mEq/L (ref 3.5–5.3)
Sodium: 136 mEq/L (ref 135–145)

## 2014-07-02 LAB — INSULIN, FASTING: INSULIN FASTING, SERUM: 16.8 u[IU]/mL (ref 2.0–19.6)

## 2014-07-02 LAB — LIPID PANEL
Cholesterol: 197 mg/dL (ref 0–200)
HDL: 34 mg/dL — ABNORMAL LOW (ref >39)
LDL Cholesterol: 125 mg/dL — ABNORMAL HIGH (ref 0–99)
Total CHOL/HDL Ratio: 5.8 Ratio
Triglycerides: 188 mg/dL — ABNORMAL HIGH (ref <150)
VLDL: 38 mg/dL (ref 0–40)

## 2014-07-02 LAB — TSH: TSH: 0.682 u[IU]/mL (ref 0.350–4.500)

## 2014-07-02 LAB — VITAMIN B12: VITAMIN B 12: 656 pg/mL (ref 211–911)

## 2014-07-02 LAB — HEMOGLOBIN A1C
HEMOGLOBIN A1C: 6.5 % — AB (ref <5.7)
Mean Plasma Glucose: 140 mg/dL — ABNORMAL HIGH (ref <117)

## 2014-07-02 LAB — VITAMIN D 25 HYDROXY (VIT D DEFICIENCY, FRACTURES): Vit D, 25-Hydroxy: 70 ng/mL (ref 30–89)

## 2014-07-12 ENCOUNTER — Telehealth (HOSPITAL_COMMUNITY): Payer: Self-pay | Admitting: *Deleted

## 2014-07-16 ENCOUNTER — Encounter (HOSPITAL_COMMUNITY): Payer: Self-pay | Admitting: *Deleted

## 2014-07-17 ENCOUNTER — Encounter (HOSPITAL_COMMUNITY): Payer: Self-pay | Admitting: *Deleted

## 2014-07-29 ENCOUNTER — Other Ambulatory Visit: Payer: Self-pay | Admitting: Emergency Medicine

## 2014-08-02 ENCOUNTER — Encounter: Payer: Self-pay | Admitting: Physician Assistant

## 2014-08-02 ENCOUNTER — Ambulatory Visit (INDEPENDENT_AMBULATORY_CARE_PROVIDER_SITE_OTHER): Payer: Medicare Other | Admitting: Physician Assistant

## 2014-08-02 VITALS — BP 128/80 | HR 64 | Temp 97.7°F | Resp 16 | Ht 68.0 in | Wt 232.0 lb

## 2014-08-02 DIAGNOSIS — G5792 Unspecified mononeuropathy of left lower limb: Secondary | ICD-10-CM

## 2014-08-02 DIAGNOSIS — Z23 Encounter for immunization: Secondary | ICD-10-CM

## 2014-08-02 DIAGNOSIS — I482 Chronic atrial fibrillation: Secondary | ICD-10-CM

## 2014-08-02 DIAGNOSIS — Z7901 Long term (current) use of anticoagulants: Secondary | ICD-10-CM

## 2014-08-02 DIAGNOSIS — I4821 Permanent atrial fibrillation: Secondary | ICD-10-CM

## 2014-08-02 LAB — PROTIME-INR
INR: 2.71 — ABNORMAL HIGH (ref <1.50)
Prothrombin Time: 28.8 seconds — ABNORMAL HIGH (ref 11.6–15.2)

## 2014-08-02 MED ORDER — PRAVASTATIN SODIUM 40 MG PO TABS: 40.0000 mg | ORAL_TABLET | ORAL | Status: DC

## 2014-08-02 NOTE — Progress Notes (Signed)
Coumadin follow up  Patient is on Coumadin for Primary Diagnosis: Permanent atrial fibrillation [I48.2] with mechanical valve Patient's last INR is  Lab Results  Component Value Date   INR 2.87* 07/01/2014   INR 2.50* 05/27/2014   INR 2.08* 05/02/2014    Patient denies SOB, CP, dizziness, nose bleeds, easy bleeding, and blood in stool/urine. His coumadin dose was not changed last visit. Denies missed doses/ABX.  Recently saw Dr. Jennette Banker, nephrology, and started him on Spirolactone, has only taken one dose.  He has been having right hip/thigh pain, burning sensation, better at night, worse with walking. Denies weakness in his legs.    Current Outpatient Prescriptions on File Prior to Visit  Medication Sig Dispense Refill  . albuterol (PROVENTIL) (2.5 MG/3ML) 0.083% nebulizer solution Take 2.5 mg by nebulization every 6 (six) hours as needed for wheezing or shortness of breath.      . allopurinol (ZYLOPRIM) 300 MG tablet Take 1 tablet (300 mg total) by mouth daily. To prevent gout  90 tablet  99  . aspirin 81 MG tablet Take 81 mg by mouth daily.      . bumetanide (BUMEX) 2 MG tablet Take 2 mg by mouth 2 (two) times daily.      . Cholecalciferol (VITAMIN D PO) Take 5,000 Units by mouth daily.       . citalopram (CELEXA) 40 MG tablet TAKE 1 TABLET BY MOUTH EVERY DAY  90 tablet  1  . clonazePAM (KLONOPIN) 1 MG tablet TAKE 1 TABLET BY MOUTH AT BEDTIME AS NEEDED  90 tablet  0  . Cyanocobalamin (VITAMIN B 12 PO) Take 1,500 mcg by mouth daily.       [name] diltiazem (TIAZAC) 120 MG 24 hr capsule Take 120 mg by mouth daily.       . fenofibrate micronized (LOFIBRA) 134 MG capsule TAKE 1 CAPSULE EVERY DAY  90 capsule  99  . ferrous sulfate 325 (65 FE) MG tablet Take 325 mg by mouth daily.      [name] ipratropium-albuterol (DUONEB) 0.5-2.5 (3) MG/3ML SOLN Inhale 3 mLs into the lungs every 6 (six) hours as needed.       . isosorbide mononitrate (IMDUR) 30 MG 24 hr tablet TAKE 1 TABLET BY MOUTH EVERY DAY  90  tablet  99  . levothyroxine (SYNTHROID, LEVOTHROID) 50 MCG tablet TAKE ONE TABLET BY MOUTH ONCE DAILY FOR THYROID  90 tablet  2  . loratadine (CLARITIN) 10 MG tablet Take 10 mg by mouth daily.      . nitroGLYCERIN (NITROLINGUAL) 0.4 MG/SPRAY spray Place 1 spray under the tongue every 5 (five) minutes x 3 doses as needed for chest pain.      . pravastatin (PRAVACHOL) 40 MG tablet Take 40 mg by mouth every morning.       . tiotropium (SPIRIVA) 18 MCG inhalation capsule Place 18 mcg into inhaler and inhale daily as needed (for shortness of breath).       . warfarin (COUMADIN) 5 MG tablet Take 5 mg by mouth daily. Take as directed       No current facility-administered medications on file prior to visit.   Past Medical History  Diagnosis Date  . Mechanical heart valve present     a. Hx mechanical St Jude AVR 2000.  . Cataracts, bilateral   . OSA (obstructive sleep apnea)   . Permanent atrial fibrillation   . Ischemic cardiomyopathy   . History of echocardiogram 04/17/2013    EF 40-45%; LV hypertrophy;  LV mild-mod dilated; AV regurg.; LA severely diated, RA mildly dilated  . History of Doppler ultrasound 04/05/2013    LEAs; small distal abd. aoritc aneuysm is stable; fem-pop graft to R leg has 50-69% blockage  . History of nuclear stress test 05/14/2011    Dipyridamole; moderate perfusion defect in Basal Infrioer & Mid Inferior region - consistent w/infarct or scar; global LV systolic function mildly reduced; EKG negative for ischemia; low risk scan  . COPD (chronic obstructive pulmonary disease)   . Hyperlipidemia   . Hypertension   . Kidney cysts   . CKD (chronic kidney disease), stage III   . Hiatal hernia   . GERD (gastroesophageal reflux disease)   . Prediabetes   . Gout   . AAA (abdominal aortic aneurysm)   . Vitamin B deficiency   . Peripheral arterial disease     a. s/p aorto bifem BG;  b. 11/2013 PVA distal R Fem-fem stenosis;  c. 12/2013 PTA of dist R femoral bypass graft.  [name] CAD  (coronary artery disease)     a. CABG '00. b. all SVGs occluded 2010. c. low risk Nuc 2012   . Chronic anticoagulation    Allergies  Allergen Reactions  . Chantix [Varenicline] Nausea And Vomiting  . Lyrica [Pregabalin]     Swelling, couldn't breathe  . Nsaids Other (See Comments)    Unknown   . Simvastatin     Pt said he had a "bleed out" after taking it  . Ziac [Bisoprolol-Hydrochlorothiazide]     fatigue    ROS Constitutional: Denies fever, chills, headaches, fatigue. Cardio: Denies chest pain, palpitations, irregular heartbeat, syncope, dyspnea, diaphoresis, orthopnea, PND, claudication, edema Respiratory: denies cough, dyspnea, DOE, pleurisy, hoarseness, laryngitis, wheezing.  Gastrointestinal: Denies dysphagia, heartburn, reflux, pain, cramps, nausea, diarrhea, constipation, hematemesis, melena, hematochezia Genitourinary: Denies dysuria, frequency, hematuria, flank pain Musculoskeletal: Denies arthralgia, myalgia, stiffness, Jt. Swelling, pain, limp, strain/sprain. Skin: Denies rash, ecchymosis, petechial. Neuro: Denies Weakness, tremor, incoordination, spasms, paresthesia, pain Heme/Lymph: Denies Excessive bleeding, bruising, enlarged lymph nodes  Physical: Blood pressure 128/80, pulse 64, temperature 97.7 F (36.5 C), resp. rate 16, height 5\' 8"  (1.727 m), weight 232 lb (105.235 kg). Filed Weights   08/02/14 1014  Weight: 232 lb (105.235 kg)    General Appearance: Well nourished, in no apparent distress. ENT/Mouth: Nares clear with no erythema, swelling, mucus on turbinates. No ulcers, cracking, on lips. No erythema, swelling, or exudate on post pharynx.  Neck: Supple, thyroid normal.  Respiratory: CTAB   Cardio: Irregular, irregular rhythm, mechanical click, rubs or gallops. No edema  Abdomen: Soft, with bowl sounds. Non tender, no guarding, rebound, hernias, masses, or organomegaly.  Skin: Warm, dry without rashes, lesions, ecchymosis visible  Neuro: normal  sensory exam, no SI pain, discomfort at lateral left hip.     Assessment and plan: Chronic anticoagulation- check INR and will adjust medication according to labs.  Goal 2.5-3.5 Discussed if patient falls to immediately contact office or go to ER. Discussed foods that can increase or decrease Coumadin levels. Patient understands to call the office before starting a new medication. Follow up in one month.   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

## 2014-08-02 NOTE — Patient Instructions (Signed)

## 2014-08-16 ENCOUNTER — Ambulatory Visit (HOSPITAL_COMMUNITY)
Admission: RE | Admit: 2014-08-16 | Discharge: 2014-08-16 | Disposition: A | Discharge status: Home / Self Care | Payer: Medicare Other | Source: Ambulatory Visit | Attending: Cardiovascular Disease | Admitting: Cardiovascular Disease

## 2014-08-16 DIAGNOSIS — I771 Stricture of artery: Secondary | ICD-10-CM | POA: Diagnosis not present

## 2014-08-16 DIAGNOSIS — I739 Peripheral vascular disease, unspecified: Secondary | ICD-10-CM | POA: Diagnosis not present

## 2014-08-16 DIAGNOSIS — I6523 Occlusion and stenosis of bilateral carotid arteries: Secondary | ICD-10-CM | POA: Diagnosis not present

## 2014-08-16 DIAGNOSIS — I77811 Abdominal aortic ectasia: Secondary | ICD-10-CM | POA: Diagnosis not present

## 2014-08-16 NOTE — Progress Notes (Signed)
Lower Extremity Arterial Duplex Completed. °Brianna L Mazza,RVT °

## 2014-09-02 ENCOUNTER — Encounter: Payer: Self-pay | Admitting: Internal Medicine

## 2014-09-02 ENCOUNTER — Ambulatory Visit (INDEPENDENT_AMBULATORY_CARE_PROVIDER_SITE_OTHER): Payer: Medicare Other | Admitting: Physician Assistant

## 2014-09-02 VITALS — BP 130/80 | HR 68 | Temp 98.1°F | Resp 16 | Ht 68.0 in | Wt 227.0 lb

## 2014-09-02 DIAGNOSIS — Z952 Presence of prosthetic heart valve: Secondary | ICD-10-CM

## 2014-09-02 DIAGNOSIS — Z7901 Long term (current) use of anticoagulants: Secondary | ICD-10-CM

## 2014-09-02 DIAGNOSIS — I10 Essential (primary) hypertension: Secondary | ICD-10-CM

## 2014-09-02 NOTE — Progress Notes (Addendum)
Patient ID: Robert Stanton, male   DOB: 10-23-1940, 73 y.o.   MRN: [id number]  Coumadin follow up  Patient is on Coumadin for Permanent Atrial Fibrillation.  Patient's last INR is  Lab Results  Component Value Date   INR 2.71* 08/02/2014   INR 2.87* 07/01/2014   INR 2.50* 05/27/2014    Patient denies SOB, CP, dizziness, nose bleeds, easy bleeding, and blood in stool/urine. His coumadin dose was not changed last visit. He has not taken ABX, has not missed any doses and denies a fall.    Current Coumadin Dosing: Taking 2.5mg - 1/2 tablet on Tuesdays and Thursdays.  Taking 5mg - 1 tablet daily for 5 days of week.  Total 30mg  per week.   Medication Sig  . albuterol (PROVENTIL) (2.5 MG/3ML) 0.083% nebulizer solution Take 2.5 mg by nebulization every 6 (six) hours as needed for wheezing or shortness of breath.  . allopurinol (ZYLOPRIM) 300 MG tablet Take 1 tablet (300 mg total) by mouth daily. To prevent gout  . aspirin 81 MG tablet Take 81 mg by mouth daily.  . bumetanide (BUMEX) 2 MG tablet Take 2 mg by mouth 2 (two) times daily.  . Cholecalciferol (VITAMIN D PO) Take 5,000 Units by mouth daily.   . citalopram (CELEXA) 40 MG tablet TAKE 1 TABLET BY MOUTH EVERY DAY  . clonazePAM (KLONOPIN) 1 MG tablet TAKE 1 TABLET BY MOUTH AT BEDTIME AS NEEDED  . Cyanocobalamin (VITAMIN B 12 PO) Take 1,500 mcg by mouth daily.   [name] diltiazem (TIAZAC) 120 MG 24 hr capsule Take 120 mg by mouth daily.   . fenofibrate micronized (LOFIBRA) 134 MG capsule TAKE 1 CAPSULE EVERY DAY  . ferrous sulfate 325 (65 FE) MG tablet Take 325 mg by mouth daily.  [name] ipratropium-albuterol (DUONEB) 0.5-2.5 (3) MG/3ML SOLN Inhale 3 mLs into the lungs every 6 (six) hours as needed.   . isosorbide mononitrate (IMDUR) 30 MG 24 hr tablet TAKE 1 TABLET BY MOUTH EVERY DAY  . levothyroxine (SYNTHROID, LEVOTHROID) 50 MCG tablet TAKE ONE TABLET BY MOUTH ONCE DAILY FOR THYROID  . loratadine (CLARITIN) 10 MG tablet Take 10 mg by mouth daily.   . nitroGLYCERIN (NITROLINGUAL) 0.4 MG/SPRAY spray Place 1 spray under the tongue every 5 (five) minutes x 3 doses as needed for chest pain.  . pravastatin (PRAVACHOL) 40 MG tablet Take 1 tablet (40 mg total) by mouth every morning.  . tiotropium (SPIRIVA) 18 MCG inhalation capsule Place 18 mcg into inhaler and inhale daily as needed (for shortness of breath).   . warfarin (COUMADIN) 5 MG tablet Take 5 mg by mouth daily. Take as directed   Past Medical History  Diagnosis Date  . Mechanical heart valve present     a. Hx mechanical St Jude AVR 2000.  . Cataracts, bilateral   . OSA (obstructive sleep apnea)   . Permanent atrial fibrillation   . Ischemic cardiomyopathy   . History of echocardiogram 04/17/2013    EF 40-45%; LV hypertrophy; LV mild-mod dilated; AV regurg.; LA severely diated, RA mildly dilated  . History of Doppler ultrasound 04/05/2013    LEAs; small distal abd. aoritc aneuysm is stable; fem-pop graft to R leg has 50-69% blockage  . History of nuclear stress test 05/14/2011    Dipyridamole; moderate perfusion defect in Basal Infrioer & Mid Inferior region - consistent w/infarct or scar; global LV systolic function mildly reduced; EKG negative for ischemia; low risk scan  . COPD (chronic obstructive pulmonary disease)   .  Hyperlipidemia   . Hypertension   . Kidney cysts   . CKD (chronic kidney disease), stage III   . Hiatal hernia   . GERD (gastroesophageal reflux disease)   . Prediabetes   . Gout   . AAA (abdominal aortic aneurysm)   . Vitamin B deficiency   . Peripheral arterial disease     a. s/p aorto bifem BG;  b. 11/2013 PVA distal R Fem-fem stenosis;  c. 12/2013 PTA of dist R femoral bypass graft.  [name] CAD (coronary artery disease)     a. CABG '00. b. all SVGs occluded 2010. c. low risk Nuc 2012   . Chronic anticoagulation    Allergies  Allergen Reactions  . Chantix [Varenicline] Nausea And Vomiting  . Lyrica [Pregabalin]     Swelling, couldn't breathe  . Nsaids  Other (See Comments)    Unknown   . Simvastatin     Pt said he had a "bleed out" after taking it  . Ziac [Bisoprolol-Hydrochlorothiazide]     fatigue   ROS Constitutional: Denies fever, chills, headaches, fatigue. Cardio: Denies chest pain, palpitations, irregular heartbeat, syncope, dyspnea, diaphoresis, orthopnea, PND, claudication, edema Respiratory: denies cough, dyspnea, DOE, pleurisy, hoarseness, laryngitis, wheezing.  Gastrointestinal: Denies dysphagia, heartburn, reflux, pain, cramps, nausea, diarrhea, constipation, hematemesis, melena, hematochezia Genitourinary: Denies dysuria, frequency, hematuria, flank pain Musculoskeletal: Denies arthralgia, myalgia, stiffness, Jt. Swelling, pain, limp, strain/sprain. Skin: Denies rash, ecchymosis, petechial. Neuro: Denies Weakness, tremor, incoordination, spasms, paresthesia, pain Heme/Lymph: Denies Excessive bleeding, bruising, enlarged lymph nodes  Physical: BP 130/80 mmHg  Pulse 68  Temp(Src) 98.1 F (36.7 C)  Resp 16  Ht 5\' 8"  (1.727 m)  Wt 227 lb (102.967 kg)  BMI 34.52 kg/m2 Wt Readings from Last 3 Encounters:  09/02/14 227 lb (102.967 kg)  08/02/14 232 lb (105.235 kg)  07/01/14 230 lb 12.8 oz (104.69 kg)  Vitals Reviewed. General Appearance: Well nourished, in no apparent distress. Obese. ENT/Mouth: Nares clear with no erythema, swelling, mucus on turbinates. No ulcers, cracking, on lips. No erythema, swelling, or exudate on post pharynx.  Neck: Supple, thyroid normal.  Respiratory: CTAB   Cardio: Irregularly irregular with normal rate, Mechanical Valve sounds with no murmurs, rubs or gallops. No edema  Abdomen: Soft, with bowl sounds. Non tender, no guarding, rebound, hernias, masses, or organomegaly.  Skin: Warm, dry without rashes, lesions, ecchymosis.  Neuro: Unremarkable  Assessment and plan:  1) St Jude Mechanical Aortic Valve  2) HTN Continue medications as prescribed.    3) Chronic anticoagulation- check  INR and will adjust medication according to labs.  Discussed if patient falls to immediately contact office or go to ER. Discussed foods that can increase or decrease Coumadin levels. Patient understands to call the office before starting a new medication. Follow up in one month- appt on 10/02/14 with Vicie Mutters, PA-C  Discussed medication effects and SE's.  Pt agreed to treatment plan.  Couillard, Stephani Police, PA-C 12:38 PM [facility]

## 2014-09-03 LAB — PROTIME-INR
INR: 3.27 — ABNORMAL HIGH (ref <1.50)
PROTHROMBIN TIME: 33.3 s — AB (ref 11.6–15.2)

## 2014-09-16 ENCOUNTER — Other Ambulatory Visit: Payer: Self-pay | Admitting: *Deleted

## 2014-09-16 MED ORDER — WARFARIN SODIUM 5 MG PO TABS: 5.0000 mg | ORAL_TABLET | Freq: Every day | ORAL | Status: DC

## 2014-09-26 ENCOUNTER — Encounter (HOSPITAL_COMMUNITY): Payer: Self-pay | Admitting: Cardiovascular Disease

## 2014-09-30 ENCOUNTER — Other Ambulatory Visit: Payer: Self-pay

## 2014-09-30 MED ORDER — DILTIAZEM HCL ER COATED BEADS 120 MG PO CP24: 120.0000 mg | ORAL_CAPSULE | Freq: Every day | ORAL | Status: DC

## 2014-10-02 ENCOUNTER — Ambulatory Visit: Payer: Self-pay | Admitting: Physician Assistant

## 2014-10-02 ENCOUNTER — Ambulatory Visit (INDEPENDENT_AMBULATORY_CARE_PROVIDER_SITE_OTHER): Payer: Medicare Other | Admitting: Physician Assistant

## 2014-10-02 ENCOUNTER — Other Ambulatory Visit: Payer: Self-pay | Admitting: Physician Assistant

## 2014-10-02 ENCOUNTER — Encounter: Payer: Self-pay | Admitting: Physician Assistant

## 2014-10-02 VITALS — BP 140/100 | HR 58 | Temp 98.4°F | Resp 18 | Ht 68.0 in | Wt 230.0 lb

## 2014-10-02 DIAGNOSIS — I10 Essential (primary) hypertension: Secondary | ICD-10-CM

## 2014-10-02 DIAGNOSIS — I482 Chronic atrial fibrillation: Secondary | ICD-10-CM

## 2014-10-02 DIAGNOSIS — Z7901 Long term (current) use of anticoagulants: Secondary | ICD-10-CM

## 2014-10-02 DIAGNOSIS — E539 Vitamin B deficiency, unspecified: Secondary | ICD-10-CM

## 2014-10-02 DIAGNOSIS — E782 Mixed hyperlipidemia: Secondary | ICD-10-CM

## 2014-10-02 DIAGNOSIS — E1122 Type 2 diabetes mellitus with diabetic chronic kidney disease: Secondary | ICD-10-CM

## 2014-10-02 DIAGNOSIS — E039 Hypothyroidism, unspecified: Secondary | ICD-10-CM

## 2014-10-02 DIAGNOSIS — I4821 Permanent atrial fibrillation: Secondary | ICD-10-CM

## 2014-10-02 DIAGNOSIS — E559 Vitamin D deficiency, unspecified: Secondary | ICD-10-CM

## 2014-10-02 DIAGNOSIS — Z79899 Other long term (current) drug therapy: Secondary | ICD-10-CM

## 2014-10-02 DIAGNOSIS — F411 Generalized anxiety disorder: Secondary | ICD-10-CM

## 2014-10-02 LAB — PROTIME-INR
INR: 1.66 — AB (ref <1.50)
Prothrombin Time: 19.6 seconds — ABNORMAL HIGH (ref 11.6–15.2)

## 2014-10-02 MED ORDER — LORAZEPAM 1 MG PO TABS: 1.0000 mg | ORAL_TABLET | Freq: Once | ORAL | Status: DC

## 2014-10-02 NOTE — Patient Instructions (Addendum)
-Continue medications as prescribed. I will call you with lab results.  Please follow up in one month with Dr. Melford Aase.   Depression Depression refers to feeling sad, low, down in the dumps, blue, gloomy, or empty. In general, there are two kinds of depression: 1. Normal sadness or normal grief. This kind of depression is one that we all feel from time to time after upsetting life experiences, such as the loss of a job or the ending of a relationship. This kind of depression is considered normal, is short lived, and resolves within a few days to 2 weeks. Depression experienced after the loss of a loved one (bereavement) often lasts longer than 2 weeks but normally gets better with time. 2. Clinical depression. This kind of depression lasts longer than normal sadness or normal grief or interferes with your ability to function at home, at work, and in school. It also interferes with your personal relationships. It affects almost every aspect of your life. Clinical depression is an illness. Symptoms of depression can also be caused by conditions other than those mentioned above, such as:  Physical illness. Some physical illnesses, including underactive thyroid gland (hypothyroidism), severe anemia, specific types of cancer, diabetes, uncontrolled seizures, heart and lung problems, strokes, and chronic pain are commonly associated with symptoms of depression.  Side effects of some prescription medicine. In some people, certain types of medicine can cause symptoms of depression.  Substance abuse. Abuse of alcohol and illicit drugs can cause symptoms of depression. SYMPTOMS Symptoms of normal sadness and normal grief include the following:  Feeling sad or crying for short periods of time.  Not caring about anything (apathy).  Difficulty sleeping or sleeping too much.  No longer able to enjoy the things you used to enjoy.  Desire to be by oneself all the time (social isolation).  Lack of  energy or motivation.  Difficulty concentrating or remembering.  Change in appetite or weight.  Restlessness or agitation. Symptoms of clinical depression include the same symptoms of normal sadness or normal grief and also the following symptoms:  Feeling sad or crying all the time.  Feelings of guilt or worthlessness.  Feelings of hopelessness or helplessness.  Thoughts of suicide or the desire to harm yourself (suicidal ideation).  Loss of touch with reality (psychotic symptoms). Seeing or hearing things that are not real (hallucinations) or having false beliefs about your life or the people around you (delusions and paranoia). DIAGNOSIS  The diagnosis of clinical depression is usually based on how bad the symptoms are and how long they have lasted. Your health care provider will also ask you questions about your medical history and substance use to find out if physical illness, use of prescription medicine, or substance abuse is causing your depression. Your health care provider may also order blood tests. TREATMENT  Often, normal sadness and normal grief do not require treatment. However, sometimes antidepressant medicine is given for bereavement to ease the depressive symptoms until they resolve. The treatment for clinical depression depends on how bad the symptoms are but often includes antidepressant medicine, counseling with a mental health professional, or both. Your health care provider will help to determine what treatment is best for you. Depression caused by physical illness usually goes away with appropriate medical treatment of the illness. If prescription medicine is causing depression, talk with your health care provider about stopping the medicine, decreasing the dose, or changing to another medicine. Depression caused by the abuse of alcohol or illicit drugs goes  away when you stop using these substances. Some adults need professional help in order to stop drinking or  using drugs. SEEK IMMEDIATE MEDICAL CARE IF:  You have thoughts about hurting yourself or others.  You lose touch with reality (have psychotic symptoms).  You are taking medicine for depression and have a serious side effect. FOR MORE INFORMATION  [employer]ness: www.nami.[employer]: https://carter.com/ Document Released: 10/01/2000 Document Revised: 02/18/2014 Document Reviewed: 01/03/2012 [facility] Patient Information 2015 [place], [place]. This information is not intended to replace advice given to you by your health care provider. Make sure you discuss any questions you have with your health care provider.    Generalized Anxiety Disorder Generalized anxiety disorder (GAD) is a mental disorder. It interferes with life functions, including relationships, work, and school. GAD is different from normal anxiety, which everyone experiences at some point in their lives in response to specific life events and activities. Normal anxiety actually helps [place] prepare for and get through these life events and activities. Normal anxiety goes away after the event or activity is over.  GAD causes anxiety that is not necessarily related to specific events or activities. It also causes excess anxiety in proportion to specific events or activities. The anxiety associated with GAD is also difficult to control. GAD can vary from mild to severe. People with severe GAD can have intense waves of anxiety with physical symptoms (panic attacks).  SYMPTOMS The anxiety and worry associated with GAD are difficult to control. This anxiety and worry are related to many life events and activities and also occur more days than not for 6 months or longer. People with GAD also have three or more of the following symptoms (one or more in children): 3. Restlessness.  4. Fatigue. 5. Difficulty concentrating.  6. Irritability. 7. Muscle tension. 8. Difficulty sleeping or  unsatisfying sleep. DIAGNOSIS GAD is diagnosed through an assessment by your health care provider. Your health care provider will ask you questions aboutyour mood,physical symptoms, and events in your life. Your health care provider may ask you about your medical history and use of alcohol or drugs, including prescription medicines. Your health care provider may also do a physical exam and blood tests. Certain medical conditions and the use of certain substances can cause symptoms similar to those associated with GAD. Your health care provider may refer you to a mental health specialist for further evaluation. TREATMENT The following therapies are usually used to treat GAD:   Medication. Antidepressant medication usually is prescribed for long-term daily control. Antianxiety medicines may be added in severe cases, especially when panic attacks occur.   Talk therapy (psychotherapy). Certain types of talk therapy can be helpful in treating GAD by providing support, education, and guidance. A form of talk therapy called cognitive behavioral therapy can teach you healthy ways to think about and react to daily life events and activities.  Stress managementtechniques. These include yoga, meditation, and exercise and can be very helpful when they are practiced regularly. A mental health specialist can help determine which treatment is best for you. Some people see improvement with one therapy. However, other people require a combination of therapies. Document Released: 01/29/2013 Document Revised: 02/18/2014 Document Reviewed: 01/29/2013 [facility] Patient Information 2015 [place], [place]. This information is not intended to replace advice given to you by your health care provider. Make sure you discuss any questions you have with your health care provider.

## 2014-10-02 NOTE — Progress Notes (Addendum)
Assessment and Plan:  1. Essential hypertension Continue medications, monitor blood pressure at home.  Reminder to go to the ER if any CP, SOB, nausea, dizziness, severe HA, changes vision/speech, left arm numbness and tingling and jaw pain. -CBC w/Diff -BMP with GFR -Hepatic Function Panel  2. Permanent atrial fibrillation Continue medications as prescribed.  Check PT/INR.  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  Check A1C and Insulin levels.  4. Hyperlipidemia Continue Lofibra and Pravastatin as prescribed.  Please follow recommended diet and exercise.  Check Cholesterol. -Lipid Panel  5. Long term current use of anticoagulant therapy Chronic anticoagulation- check INR and will adjust medication according to labs.  Discussed if patient falls to immediately contact office or go to ER. Discussed foods that can increase or decrease Coumadin levels. Patient understands to call the office before starting a new medication. -PT/INR  6. Encounter for long-term (current) use of medications Will monitor kidney and liver function.  Please keep follow up appts with Kidney doctor. -CBC w/Diff -BMP with GFR -Hepatic Function Panel  7. Vitamin D deficiency Continue 5,000 IU daily.  Check vitamin D level. -Vitamin D hydroxy  8. Vitamin B deficiency Continue B12 as prescribed.  9. Generalized Anxiety Disorder -Continue medications as prescribed.   -Patient was able to drive home without complications. -Patient would like to discuss at next visit in one month with Dr. Melford Aase.  Continue diet and meds as discussed. Further disposition pending results of labs.  Discussed medication effects and SE's.  Pt agreed to treatment plan. Please follow up in one month with Dr. Melford Aase.   HPI A Caucasian 73 y.o. male  presents for 3 month follow up with hypertension, hyperlipidemia, diabetes and vitamin D.  Patient is also here for  regular coumadin follow up due to Atrial Fibrillation.    His blood pressure has been controlled at home, today their BP is BP: (!) 140/100 mmHg. He does not workout, but does yard work outside. He denies chest pain, shortness of breath, dizziness.   He is on cholesterol medication (Lofibra and Pravastatin) and denies myalgias. His cholesterol is not at goal. The cholesterol last visit was:   Lab Results  Component Value Date   CHOL 197 07/01/2014   HDL 34* 07/01/2014   LDLCALC 125* 07/01/2014   TRIG 188* 07/01/2014   CHOLHDL 5.8 07/01/2014   He has been working on diet, but not exercise for Diabetes, and denies increased appetite, polydipsia and polyuria. Patient has had diabetes for several years.  Last A1C in the office was:  Lab Results  Component Value Date   HGBA1C 6.5* 07/01/2014  Currently manages diabetes with?  Diet and exercise      Number of meals per day?  2 meals  B- cheese toast, oatmeal , egg   D- vegetable, piece of chicken   Snacks? Pack of crackers  Beverages? Coffee (Patient states he drinks coffee all day 8-10 cups per day), water, no soda Up to date Eye Exam? 6 months ago.  Dr. Marcelline Mates.   Does pt check their feet every day? No numbness or tingling, no ulcer or sores  Currently Smoking- Usually smokes 1/2 pack daily, but has increased smoking to 3/4 pack daily.  Patient is on Vitamin D supplement. 5,000 IU daily.  Does take it, but ran out recently Lab Results  Component Value Date   VD25OH 79 07/01/2014     Anxiety- Patient states he has stopped  taking Celexa due to headaches and other side effects.  Patient does states that he gets stress and anxiety from wife and how her health status is.  Patient would not go into details with me and would rather talk to Dr. Melford Aase.  Patient stated he wants me to talk with Dr. Melford Aase before next appointment next month so that Dr. Melford Aase can know what happened at this visit.  Patient broke down and started to cry  when talking about his anxiety and his wife.  Patient was so anxious and emotional, that he stayed in the office until he calmed down and was OK to drive.  Patient is on Coumadin for Permanent Atrial Fibrillation.  Current Coumadin Dose- Please take 1 (5mg ) tablet Monday, Wednesday and Friday. Please take 1/2 (5mg ) tablet for the other 4 days of the week. For a total of 25mg  per week. Patient's last INR is  Lab Results  Component Value Date   INR 3.27* 09/02/2014   INR 2.71* 08/02/2014   INR 2.87* 07/01/2014    Patient denies SOB, CP, dizziness, nose bleeds, easy bleeding, and blood in stool/urine. His coumadin dose was changed last visit. He has not taken ABX, has not missed any doses and denies a fall.    Current Medications:  Current Outpatient Prescriptions on File Prior to Visit  Medication Sig Dispense Refill  . albuterol (PROVENTIL) (2.5 MG/3ML) 0.083% nebulizer solution Take 2.5 mg by nebulization every 6 (six) hours as needed for wheezing or shortness of breath.    . allopurinol (ZYLOPRIM) 300 MG tablet Take 1 tablet (300 mg total) by mouth daily. To prevent gout 90 tablet 99  . aspirin 81 MG tablet Take 81 mg by mouth daily.    . bumetanide (BUMEX) 2 MG tablet Take 2 mg by mouth 2 (two) times daily.    . Cholecalciferol (VITAMIN D PO) Take 5,000 Units by mouth daily.     . clonazePAM (KLONOPIN) 1 MG tablet TAKE 1 TABLET BY MOUTH AT BEDTIME AS NEEDED 90 tablet 0  . Cyanocobalamin (VITAMIN B 12 PO) Take 1,500 mcg by mouth daily.     [name] diltiazem (CARDIZEM CD) 120 MG 24 hr capsule Take 1 capsule (120 mg total) by mouth daily. 90 capsule 4  . diltiazem (TIAZAC) 120 MG 24 hr capsule Take 120 mg by mouth daily.     . fenofibrate micronized (LOFIBRA) 134 MG capsule TAKE 1 CAPSULE EVERY DAY 90 capsule 99  . ferrous sulfate 325 (65 FE) MG tablet Take 325 mg by mouth daily.    [name] ipratropium-albuterol (DUONEB) 0.5-2.5 (3) MG/3ML SOLN Inhale 3 mLs into the lungs every 6 (six) hours as  needed.     . isosorbide mononitrate (IMDUR) 30 MG 24 hr tablet TAKE 1 TABLET BY MOUTH EVERY DAY 90 tablet 99  . levothyroxine (SYNTHROID, LEVOTHROID) 50 MCG tablet TAKE ONE TABLET BY MOUTH ONCE DAILY FOR THYROID 90 tablet 2  . loratadine (CLARITIN) 10 MG tablet Take 10 mg by mouth daily.    . nitroGLYCERIN (NITROLINGUAL) 0.4 MG/SPRAY spray Place 1 spray under the tongue every 5 (five) minutes x 3 doses as needed for chest pain.    . pravastatin (PRAVACHOL) 40 MG tablet Take 1 tablet (40 mg total) by mouth every morning. 30 tablet 5  . spironolactone (ALDACTONE) 25 MG tablet   3  . tiotropium (SPIRIVA) 18 MCG inhalation capsule Place 18 mcg into inhaler and inhale daily as needed (for shortness of breath).     [name]  warfarin (COUMADIN) 5 MG tablet Take 1 tablet (5 mg total) by mouth daily. Take as directed 90 tablet 3   No current facility-administered medications on file prior to visit.   Medical History:  Past Medical History  Diagnosis Date  . Mechanical heart valve present     a. Hx mechanical [employer] Jude AVR 2000.  . Cataracts, bilateral   . OSA (obstructive sleep apnea)   . Permanent atrial fibrillation   . Ischemic cardiomyopathy   . History of echocardiogram 04/17/2013    EF 40-45%; LV hypertrophy; LV mild-mod dilated; AV regurg.; LA severely diated, RA mildly dilated  . History of Doppler ultrasound 04/05/2013    LEAs; small distal abd. aoritc aneuysm is stable; fem-pop graft to R leg has 50-69% blockage  . History of nuclear stress test 05/14/2011    Dipyridamole; moderate perfusion defect in Basal Infrioer & Mid Inferior region - consistent w/infarct or scar; global LV systolic function mildly reduced; EKG negative for ischemia; low risk scan  . COPD (chronic obstructive pulmonary disease)   . Hyperlipidemia   . Hypertension   . Kidney cysts   . CKD (chronic kidney disease), stage III   . Hiatal hernia   . GERD (gastroesophageal reflux disease)   . Prediabetes   . Gout   . AAA  (abdominal aortic aneurysm)   . Vitamin B deficiency   . Peripheral arterial disease     a. s/p aorto bifem BG;  b. 11/2013 PVA distal R Fem-fem stenosis;  c. 12/2013 PTA of dist R femoral bypass graft.  [name] CAD (coronary artery disease)     a. CABG '00. b. all SVGs occluded 2010. c. low risk Nuc 2012   . Chronic anticoagulation    Allergies:  Allergies  Allergen Reactions  . Chantix [Varenicline] Nausea And Vomiting  . Lyrica [Pregabalin]     Swelling, couldn't breathe  . Nsaids Other (See Comments)    Unknown   . Simvastatin     Pt said he had a "bleed out" after taking it  . Ziac [Bisoprolol-Hydrochlorothiazide]     fatigue    ROS- Review of Systems  Constitutional: Positive for chills. Negative for weight loss, malaise/fatigue and diaphoresis.  HENT: Negative.   Eyes: Negative.   Respiratory: Negative.   Cardiovascular: Negative.   Skin: Negative.  Negative for rash.  Neurological: Negative.   Psychiatric/Behavioral: Positive for depression. The patient is nervous/anxious.    Family history- Review and unchanged Social history- Review and unchanged  Physical Exam: BP 140/100 mmHg  Pulse 58  Temp(Src) 98.4 F (36.9 C) (Temporal)  Resp 18  Ht 5\' 8"  (1.727 m)  Wt 230 lb (104.327 kg)  BMI 34.98 kg/m2  SpO2 98% Wt Readings from Last 3 Encounters:  10/02/14 230 lb (104.327 kg)  09/02/14 227 lb (102.967 kg)  08/02/14 232 lb (105.235 kg)  Vitals Reviewed. General Appearance: Well nourished, in no apparent distress.  Obese. Eyes: PERRLA, EOMs, conjunctiva no swelling or erythema.  No scleral icterus. Sinuses: No Frontal/maxillary tenderness ENT/Mouth: Ext aud canals clear, TMs without erythema, edema or bulging. No erythema, swelling, or exudate on posterior pharynx.  Tonsils not swollen or erythematous. Hard of hearing and forgot hearing aids for visit today. Neck: Supple, thyroid normal.  Respiratory: Respiratory effort normal, CTAB.  No w/r/r or stridor.  Cardio:  Irregularly irregular with normal rate, Mechanical Valve sounds with no M/R/Gs. Brisk peripheral pulses with +1 pitting edema.  Abdomen: Soft, + normal BS.  Non tender, no  guarding, rebound, hernias, masses. Lymphatics: Non tender without lymphadenopathy.  Musculoskeletal: Full ROM, 5/5 strength, normal gait.  Skin: Warm, dry intact without rashes, lesions, ecchymosis.  Neuro: Cranial nerves intact. No cerebellar symptoms. Sensation intact.  Psych: Awake and oriented X 3, normal affect, Insight and Judgment appropriate.   Diabetic Foot Exam- normal DP and PT pulses, no trophic changes or ulcerative lesions and normal sensory exam.   Robert Stanton, Stephani Police, PA-C 4:48 PM [facility]

## 2014-10-03 DIAGNOSIS — F411 Generalized anxiety disorder: Secondary | ICD-10-CM | POA: Insufficient documentation

## 2014-10-03 LAB — CBC WITH DIFFERENTIAL/PLATELET
Basophils Absolute: 0.1 10*3/uL (ref 0.0–0.1)
Basophils Relative: 1 % (ref 0–1)
Eosinophils Absolute: 0.2 10*3/uL (ref 0.0–0.7)
Eosinophils Relative: 3 % (ref 0–5)
HEMATOCRIT: 37.7 % — AB (ref 39.0–52.0)
Hemoglobin: 12.5 g/dL — ABNORMAL LOW (ref 13.0–17.0)
Lymphocytes Relative: 34 % (ref 12–46)
Lymphs Abs: 2.5 10*3/uL (ref 0.7–4.0)
MCH: 29.8 pg (ref 26.0–34.0)
MCHC: 33.2 g/dL (ref 30.0–36.0)
MCV: 90 fL (ref 78.0–100.0)
MPV: 10.2 fL (ref 9.4–12.4)
Monocytes Absolute: 0.8 10*3/uL (ref 0.1–1.0)
Monocytes Relative: 11 % (ref 3–12)
NEUTROS PCT: 51 % (ref 43–77)
Neutro Abs: 3.7 10*3/uL (ref 1.7–7.7)
Platelets: 273 10*3/uL (ref 150–400)
RBC: 4.19 MIL/uL — AB (ref 4.22–5.81)
RDW: 15.4 % (ref 11.5–15.5)
WBC: 7.3 10*3/uL (ref 4.0–10.5)

## 2014-10-03 LAB — HEPATIC FUNCTION PANEL
ALK PHOS: 75 U/L (ref 39–117)
ALT: 15 U/L (ref 0–53)
AST: 21 U/L (ref 0–37)
Albumin: 4.1 g/dL (ref 3.5–5.2)
Bilirubin, Direct: 0.1 mg/dL (ref 0.0–0.3)
Indirect Bilirubin: 0.5 mg/dL (ref 0.2–1.2)
TOTAL PROTEIN: 7.5 g/dL (ref 6.0–8.3)
Total Bilirubin: 0.6 mg/dL (ref 0.2–1.2)

## 2014-10-03 LAB — VITAMIN B12: VITAMIN B 12: 1040 pg/mL — AB (ref 211–911)

## 2014-10-03 LAB — LIPID PANEL
CHOLESTEROL: 203 mg/dL — AB (ref 0–200)
HDL: 35 mg/dL — AB (ref >39)
LDL CALC: 128 mg/dL — AB (ref 0–99)
TRIGLYCERIDES: 201 mg/dL — AB (ref <150)
Total CHOL/HDL Ratio: 5.8 Ratio
VLDL: 40 mg/dL (ref 0–40)

## 2014-10-03 LAB — BASIC METABOLIC PANEL WITH GFR
BUN: 26 mg/dL — ABNORMAL HIGH (ref 6–23)
CHLORIDE: 98 meq/L (ref 96–112)
CO2: 29 mEq/L (ref 19–32)
Calcium: 9.2 mg/dL (ref 8.4–10.5)
Creat: 1.93 mg/dL — ABNORMAL HIGH (ref 0.50–1.35)
GFR, EST AFRICAN AMERICAN: 39 mL/min — AB
GFR, Est Non African American: 34 mL/min — ABNORMAL LOW
Glucose, Bld: 84 mg/dL (ref 70–99)
Potassium: 4.1 mEq/L (ref 3.5–5.3)
Sodium: 136 mEq/L (ref 135–145)

## 2014-10-03 LAB — HEMOGLOBIN A1C
Hgb A1c MFr Bld: 6.3 % — ABNORMAL HIGH (ref <5.7)
Mean Plasma Glucose: 134 mg/dL — ABNORMAL HIGH (ref <117)

## 2014-10-03 LAB — INSULIN, FASTING: Insulin fasting, serum: 16.2 u[IU]/mL (ref 2.0–19.6)

## 2014-10-03 LAB — VITAMIN D 25 HYDROXY (VIT D DEFICIENCY, FRACTURES): Vit D, 25-Hydroxy: 42 ng/mL (ref 30–100)

## 2014-10-07 ENCOUNTER — Encounter: Payer: Self-pay | Admitting: Physician Assistant

## 2014-10-07 DIAGNOSIS — E039 Hypothyroidism, unspecified: Secondary | ICD-10-CM | POA: Insufficient documentation

## 2014-10-07 LAB — TSH: TSH: 1.262 u[IU]/mL (ref 0.350–4.500)

## 2014-10-31 ENCOUNTER — Telehealth: Payer: Self-pay

## 2014-10-31 NOTE — Telephone Encounter (Signed)
Return call to Maudie Mercury at [facility] [phone] ext [phone], she states that she did not get any diagnosis for recent referral. Explained to Maudie Mercury that patient has changed insurance companies and that he has to re-establish with [facility] per new insurance. His previous Urologist was Dr Delfino Lovett Puchinsky in [place], [place], per Dr Puchinsky's office, his history include, history of Kidney stones, Hematuria, enlarged prostate, obstructive symptoms and renal colic.

## 2014-11-01 NOTE — Addendum Note (Signed)
Addended by: Charolette Forward on: 11/01/2014 10:08 PM   Modules accepted: Orders, Medications

## 2014-11-04 ENCOUNTER — Encounter: Payer: Self-pay | Admitting: Physician Assistant

## 2014-11-04 ENCOUNTER — Ambulatory Visit (INDEPENDENT_AMBULATORY_CARE_PROVIDER_SITE_OTHER): Payer: PPO | Admitting: Physician Assistant

## 2014-11-04 VITALS — BP 138/80 | HR 68 | Temp 97.7°F | Resp 16 | Ht 68.0 in | Wt 231.0 lb

## 2014-11-04 DIAGNOSIS — N183 Chronic kidney disease, stage 3 unspecified: Secondary | ICD-10-CM

## 2014-11-04 DIAGNOSIS — I4821 Permanent atrial fibrillation: Secondary | ICD-10-CM

## 2014-11-04 DIAGNOSIS — I482 Chronic atrial fibrillation: Secondary | ICD-10-CM

## 2014-11-04 DIAGNOSIS — Z7901 Long term (current) use of anticoagulants: Secondary | ICD-10-CM

## 2014-11-04 LAB — CBC WITH DIFFERENTIAL/PLATELET
BASOS PCT: 1 % (ref 0–1)
Basophils Absolute: 0.1 10*3/uL (ref 0.0–0.1)
EOS ABS: 0.2 10*3/uL (ref 0.0–0.7)
Eosinophils Relative: 3 % (ref 0–5)
HCT: 36.1 % — ABNORMAL LOW (ref 39.0–52.0)
Hemoglobin: 12.1 g/dL — ABNORMAL LOW (ref 13.0–17.0)
LYMPHS ABS: 2.1 10*3/uL (ref 0.7–4.0)
Lymphocytes Relative: 28 % (ref 12–46)
MCH: 30.5 pg (ref 26.0–34.0)
MCHC: 33.5 g/dL (ref 30.0–36.0)
MCV: 90.9 fL (ref 78.0–100.0)
MPV: 10.1 fL (ref 8.6–12.4)
Monocytes Absolute: 0.8 10*3/uL (ref 0.1–1.0)
Monocytes Relative: 11 % (ref 3–12)
Neutro Abs: 4.2 10*3/uL (ref 1.7–7.7)
Neutrophils Relative %: 57 % (ref 43–77)
Platelets: 243 10*3/uL (ref 150–400)
RBC: 3.97 MIL/uL — AB (ref 4.22–5.81)
RDW: 15.6 % — ABNORMAL HIGH (ref 11.5–15.5)
WBC: 7.4 10*3/uL (ref 4.0–10.5)

## 2014-11-04 LAB — BASIC METABOLIC PANEL WITH GFR
BUN: 30 mg/dL — ABNORMAL HIGH (ref 6–23)
CALCIUM: 9.2 mg/dL (ref 8.4–10.5)
CO2: 26 mEq/L (ref 19–32)
Chloride: 99 mEq/L (ref 96–112)
Creat: 1.88 mg/dL — ABNORMAL HIGH (ref 0.50–1.35)
GFR, EST NON AFRICAN AMERICAN: 35 mL/min — AB
GFR, Est African American: 41 mL/min — ABNORMAL LOW
Glucose, Bld: 66 mg/dL — ABNORMAL LOW (ref 70–99)
POTASSIUM: 4.1 meq/L (ref 3.5–5.3)
Sodium: 134 mEq/L — ABNORMAL LOW (ref 135–145)

## 2014-11-04 MED ORDER — NITROGLYCERIN 0.4 MG SL SUBL: 0.4000 mg | SUBLINGUAL_TABLET | SUBLINGUAL | Status: DC | PRN

## 2014-11-04 NOTE — Patient Instructions (Addendum)
[facility] [phone] Call this number to ask about cost of spiriva  Spiriva is ONCE A DAY PREVENTATIVE medication   Chronic Obstructive Pulmonary Disease Chronic obstructive pulmonary disease (COPD) is a common lung condition in which airflow from the lungs is limited. COPD is a general term that can be used to describe many different lung problems that limit airflow, including both chronic bronchitis and emphysema. If you have COPD, your lung function will probably never return to normal, but there are measures you can take to improve lung function and make yourself feel better.  CAUSES   Smoking (common).   Exposure to secondhand smoke.   Genetic problems.  Chronic inflammatory lung diseases or recurrent infections. SYMPTOMS   Shortness of breath, especially with physical activity.   Deep, persistent (chronic) cough with a large amount of thick mucus.   Wheezing.   Rapid breaths (tachypnea).   Gray or bluish discoloration (cyanosis) of the skin, especially in fingers, toes, or lips.   Fatigue.   Weight loss.   Frequent infections or episodes when breathing symptoms become much worse (exacerbations).   Chest tightness. DIAGNOSIS  Your health care provider will take a medical history and perform a physical examination to make the initial diagnosis. Additional tests for COPD may include:   Lung (pulmonary) function tests.  Chest X-ray.  CT scan.  Blood tests. TREATMENT  Treatment available to help you feel better when you have COPD includes:   Inhaler and nebulizer medicines. These help manage the symptoms of COPD and make your breathing more comfortable.  Supplemental oxygen. Supplemental oxygen is only helpful if you have a low oxygen level in your blood.   Exercise and physical activity. These are beneficial for nearly all people with COPD. Some people may also benefit from a pulmonary rehabilitation program. HOME CARE INSTRUCTIONS    Take all medicines (inhaled or pills) as directed by your health care provider.  Avoid over-the-counter medicines or cough syrups that dry up your airway (such as antihistamines) and slow down the elimination of secretions unless instructed otherwise by your health care provider.   If you are a smoker, the most important thing that you can do is stop smoking. Continuing to smoke will cause further lung damage and breathing trouble. Ask your health care provider for help with quitting smoking. He or she can direct you to community resources or hospitals that provide support.  Avoid exposure to irritants such as smoke, chemicals, and fumes that aggravate your breathing.  Use oxygen therapy and pulmonary rehabilitation if directed by your health care provider. If you require home oxygen therapy, ask your health care provider whether you should purchase a pulse oximeter to measure your oxygen level at home.   Avoid contact with individuals who have a contagious illness.  Avoid extreme temperature and humidity changes.  Eat healthy foods. Eating smaller, more frequent meals and resting before meals may help you maintain your strength.  Stay active, but balance activity with periods of rest. Exercise and physical activity will help you maintain your ability to do things you want to do.  Preventing infection and hospitalization is very important when you have COPD. Make sure to receive all the vaccines your health care provider recommends, especially the pneumococcal and influenza vaccines. Ask your health care provider whether you need a pneumonia vaccine.  Learn and use relaxation techniques to manage stress.  Learn and use controlled breathing techniques as directed by your health care provider. Controlled breathing techniques include:  Pursed lip breathing. Start by breathing in (inhaling) through your nose for 1 second. Then, purse your lips as if you were going to whistle and breathe  out (exhale) through the pursed lips for 2 seconds.   Diaphragmatic breathing. Start by putting one hand on your abdomen just above your waist. Inhale slowly through your nose. The hand on your abdomen should move out. Then purse your lips and exhale slowly. You should be able to feel the hand on your abdomen moving in as you exhale.   Learn and use controlled coughing to clear mucus from your lungs. Controlled coughing is a series of short, progressive coughs. The steps of controlled coughing are:  1. Lean your head slightly forward.  2. Breathe in deeply using diaphragmatic breathing.  3. Try to hold your breath for 3 seconds.  4. Keep your mouth slightly open while coughing twice.  5. Spit any mucus out into a tissue.  6. Rest and repeat the steps once or twice as needed. SEEK MEDICAL CARE IF:   You are coughing up more mucus than usual.   There is a change in the color or thickness of your mucus.   Your breathing is more labored than usual.   Your breathing is faster than usual.  SEEK IMMEDIATE MEDICAL CARE IF:   You have shortness of breath while you are resting.   You have shortness of breath that prevents you from:  Being able to talk.   Performing your usual physical activities.   You have chest pain lasting longer than 5 minutes.   Your skin color is more cyanotic than usual.  You measure low oxygen saturations for longer than 5 minutes with a pulse oximeter. MAKE SURE YOU:   Understand these instructions.  Will watch your condition.  Will get help right away if you are not doing well or get worse. Document Released: 07/14/2005 Document Revised: 02/18/2014 Document Reviewed: 05/31/2013 [facility] Patient Information 2015 [place], [place]. This information is not intended to replace advice given to you by your health care provider. Make sure you discuss any questions you have with your health care provider.

## 2014-11-04 NOTE — Progress Notes (Signed)
Assessment and plan: Chronic anticoagulation- check INR and will adjust medication according to labs.  Discussed if patient falls to immediately contact office or go to ER. Discussed foods that can increase or decrease Coumadin levels. Patient understands to call the office before starting a new medication. Follow up in one month.  Stable Angina- Will fill NTG pill and if unable to tolerate will try to get spray COPD- suggest taking the Spiriva daily- will try to do patient assistance but also given number to [facility].   Coumadin follow up  Patient is on Coumadin for Primary Diagnosis: Permanent atrial fibrillation [I48.2] Patient's last INR is  Lab Results  Component Value Date   INR 1.66* 10/02/2014   INR 3.27* 09/02/2014   INR 2.71* 08/02/2014    Patient denies, dizziness, nose bleeds, easy bleeding, and blood in stool/urine. Has stable angina, uses NTG spray occ for this, has not tried the pills will try them.  His coumadin dose was not changed last visit, he is on 1 tablet daily of the coumadin. He has not taken ABX, has not missed any doses and denies a fall.   He complains that he can not sleep at night time. He will start yawning but as soon as he lays down he can not sleep. He will finally fall asleep at 4 or 5 and then sleep until 1 AM. Declines medications to help him.  States he is house bound due to the weather, he can not go out in the cold due to his breathing. He has been coughing up white/gray chunks since christmas. Only uses spirvia as needed   Current Outpatient Prescriptions on File Prior to Visit  Medication Sig Dispense Refill  . albuterol (PROVENTIL) (2.5 MG/3ML) 0.083% nebulizer solution Take 2.5 mg by nebulization every 6 (six) hours as needed for wheezing or shortness of breath.    . allopurinol (ZYLOPRIM) 300 MG tablet Take 1 tablet (300 mg total) by mouth daily. To prevent gout 90 tablet 99  . aspirin 81 MG tablet Take 81 mg by mouth daily.    . bumetanide  (BUMEX) 2 MG tablet Take 2 mg by mouth 2 (two) times daily.    . Cholecalciferol (VITAMIN D PO) Take 5,000 Units by mouth daily.     . clonazePAM (KLONOPIN) 1 MG tablet TAKE 1 TABLET BY MOUTH AT BEDTIME AS NEEDED 90 tablet 0  . Cyanocobalamin (VITAMIN B 12 PO) Take 1,500 mcg by mouth daily.     [name] diltiazem (CARDIZEM CD) 120 MG 24 hr capsule Take 1 capsule (120 mg total) by mouth daily. 90 capsule 4  . diltiazem (TIAZAC) 120 MG 24 hr capsule Take 120 mg by mouth daily.     . fenofibrate micronized (LOFIBRA) 134 MG capsule TAKE 1 CAPSULE EVERY DAY 90 capsule 99  . ferrous sulfate 325 (65 FE) MG tablet Take 325 mg by mouth daily.    [name] ipratropium-albuterol (DUONEB) 0.5-2.5 (3) MG/3ML SOLN Inhale 3 mLs into the lungs every 6 (six) hours as needed.     . isosorbide mononitrate (IMDUR) 30 MG 24 hr tablet TAKE 1 TABLET BY MOUTH EVERY DAY 90 tablet 99  . levothyroxine (SYNTHROID, LEVOTHROID) 50 MCG tablet TAKE ONE TABLET BY MOUTH ONCE DAILY FOR THYROID 90 tablet 2  . loratadine (CLARITIN) 10 MG tablet Take 10 mg by mouth daily.    . nitroGLYCERIN (NITROLINGUAL) 0.4 MG/SPRAY spray Place 1 spray under the tongue every 5 (five) minutes x 3 doses as needed for chest  pain.    . pravastatin (PRAVACHOL) 40 MG tablet Take 1 tablet (40 mg total) by mouth every morning. 30 tablet 5  . spironolactone (ALDACTONE) 25 MG tablet   3  . tiotropium (SPIRIVA) 18 MCG inhalation capsule Place 18 mcg into inhaler and inhale daily as needed (for shortness of breath).     . warfarin (COUMADIN) 5 MG tablet Take 1 tablet (5 mg total) by mouth daily. Take as directed 90 tablet 3   No current facility-administered medications on file prior to visit.   Past Medical History  Diagnosis Date  . Mechanical heart valve present     a. Hx mechanical St Jude AVR 2000.  . Cataracts, bilateral   . OSA (obstructive sleep apnea)   . Permanent atrial fibrillation   . Ischemic cardiomyopathy   . History of echocardiogram 04/17/2013    EF  40-45%; LV hypertrophy; LV mild-mod dilated; AV regurg.; LA severely diated, RA mildly dilated  . History of Doppler ultrasound 04/05/2013    LEAs; small distal abd. aoritc aneuysm is stable; fem-pop graft to R leg has 50-69% blockage  . History of nuclear stress test 05/14/2011    Dipyridamole; moderate perfusion defect in Basal Infrioer & Mid Inferior region - consistent w/infarct or scar; global LV systolic function mildly reduced; EKG negative for ischemia; low risk scan  . COPD (chronic obstructive pulmonary disease)   . Hyperlipidemia   . Hypertension   . Kidney cysts   . CKD (chronic kidney disease), stage III   . Hiatal hernia   . GERD (gastroesophageal reflux disease)   . Prediabetes   . Gout   . AAA (abdominal aortic aneurysm)   . Vitamin B deficiency   . Peripheral arterial disease     a. s/p aorto bifem BG;  b. 11/2013 PVA distal R Fem-fem stenosis;  c. 12/2013 PTA of dist R femoral bypass graft.  [name] CAD (coronary artery disease)     a. CABG '00. b. all SVGs occluded 2010. c. low risk Nuc 2012   . Chronic anticoagulation    Allergies  Allergen Reactions  . Chantix [Varenicline] Nausea And Vomiting  . Lyrica [Pregabalin]     Swelling, couldn't breathe  . Nsaids Other (See Comments)    Unknown   . Simvastatin     Pt said he had a "bleed out" after taking it  . Ziac [Bisoprolol-Hydrochlorothiazide]     fatigue    ROS Constitutional: Denies fever, chills, headaches, fatigue. Cardio: + CP Denies palpitations, irregular heartbeat, syncope, dyspnea, diaphoresis, orthopnea, PND, claudication, edema Respiratory: + DOE  denies cough, dyspnea, pleurisy, hoarseness, laryngitis, wheezing.  Gastrointestinal: Denies dysphagia, heartburn, reflux, pain, cramps, nausea, diarrhea, constipation, hematemesis, melena, hematochezia Genitourinary: Denies dysuria, frequency, hematuria, flank pain Musculoskeletal: Denies arthralgia, myalgia, stiffness, Jt. Swelling, pain, limp,  strain/sprain. Skin: Denies rash, ecchymosis, petechial. Neuro: Denies Weakness, tremor, incoordination, spasms, paresthesia, pain Heme/Lymph: Denies Excessive bleeding, bruising, enlarged lymph nodes  Physical: Blood pressure 138/80, pulse 68, temperature 97.7 F (36.5 C), resp. rate 16, height 5\' 8"  (1.727 m), weight 231 lb (104.781 kg). Filed Weights   11/04/14 1114  Weight: 231 lb (104.781 kg)    General Appearance: Well nourished, in no apparent distress. ENT/Mouth: Nares clear with no erythema, swelling, mucus on turbinates. No ulcers, cracking, on lips. No erythema, swelling, or exudate on post pharynx.  Neck: Supple, thyroid normal.  Respiratory: CTAB   Cardio: Irregular, irregular rhythm, no murmurs, rubs or gallops. No edema  Abdomen: Soft, with bowl  sounds. Non tender, no guarding, rebound, hernias, masses, or organomegaly.  Skin: Warm, dry without rashes, lesions, ecchymosis.  Neuro: Unremarkable

## 2014-11-05 LAB — PROTIME-INR
INR: 3.77 — AB (ref <1.50)
Prothrombin Time: 37.2 seconds — ABNORMAL HIGH (ref 11.6–15.2)

## 2014-11-27 ENCOUNTER — Ambulatory Visit (INDEPENDENT_AMBULATORY_CARE_PROVIDER_SITE_OTHER): Payer: PPO | Admitting: *Deleted

## 2014-11-27 DIAGNOSIS — Z7901 Long term (current) use of anticoagulants: Secondary | ICD-10-CM

## 2014-11-27 LAB — PROTIME-INR
INR: 1.81 — ABNORMAL HIGH (ref <1.50)
Prothrombin Time: 21 seconds — ABNORMAL HIGH (ref 11.6–15.2)

## 2014-11-27 NOTE — Progress Notes (Signed)
Patient ID: Robert Stanton, male   DOB: 1941/03/10, 74 y.o.   MRN: [id number] Patient presents for recheck PT/INR. Currently taking Coumadin 2.5 mg M,W,F and 5 mg times 4 days.

## 2014-12-05 ENCOUNTER — Telehealth: Payer: Self-pay | Admitting: *Deleted

## 2014-12-05 ENCOUNTER — Ambulatory Visit (INDEPENDENT_AMBULATORY_CARE_PROVIDER_SITE_OTHER): Payer: PPO | Admitting: Internal Medicine

## 2014-12-05 VITALS — BP 138/66 | HR 72 | Temp 98.1°F | Resp 16 | Ht 68.0 in | Wt 228.0 lb

## 2014-12-05 DIAGNOSIS — I482 Chronic atrial fibrillation: Secondary | ICD-10-CM

## 2014-12-05 DIAGNOSIS — E1122 Type 2 diabetes mellitus with diabetic chronic kidney disease: Secondary | ICD-10-CM

## 2014-12-05 DIAGNOSIS — I10 Essential (primary) hypertension: Secondary | ICD-10-CM

## 2014-12-05 DIAGNOSIS — Z7901 Long term (current) use of anticoagulants: Secondary | ICD-10-CM

## 2014-12-05 DIAGNOSIS — I4821 Permanent atrial fibrillation: Secondary | ICD-10-CM

## 2014-12-05 DIAGNOSIS — E782 Mixed hyperlipidemia: Secondary | ICD-10-CM

## 2014-12-05 NOTE — Telephone Encounter (Signed)
Referral submitted and approved for patient to see Dr.Grapey at [facility] and Dr.Patel at [facility]. Left message for patient's son, Roselyn Reef, to return my call.

## 2014-12-05 NOTE — Telephone Encounter (Signed)
WAITING ON RECORDS & REFERRAL TO DR Risa Grill FOR NP ENLARGED PROSTATE, BLOOD IN URINE & POSSIBLE KIDNEY STONES. NPI = [id number] FAX [phone] APPT NEXT [facility]

## 2014-12-06 LAB — PROTIME-INR
INR: 2.48 — ABNORMAL HIGH (ref <1.50)
PROTHROMBIN TIME: 26.8 s — AB (ref 11.6–15.2)

## 2014-12-07 ENCOUNTER — Encounter: Payer: Self-pay | Admitting: Internal Medicine

## 2014-12-07 ENCOUNTER — Encounter: Payer: Self-pay | Admitting: *Deleted

## 2014-12-07 MED ORDER — FENOFIBRATE 145 MG PO TABS: ORAL_TABLET | ORAL | Status: DC

## 2014-12-07 MED ORDER — DILTIAZEM HCL ER 120 MG PO CP12: ORAL_CAPSULE | ORAL | Status: DC

## 2014-12-07 NOTE — Progress Notes (Signed)
Patient ID: Robert Stanton, male   DOB: 03-18-1941, 74 y.o.   MRN: [id number]  Coumadin follow up  Patient is on Coumadin for Primary Diagnosis: Permanent atrial fibrillation [I48.2] Patient's last INR is  Lab Results  Component Value Date   INR 2.48* 12/05/2014   INR 1.81* 11/27/2014   INR 3.77* 11/04/2014    Patient denies SOB, CP, dizziness, nose bleeds, easy bleeding, and blood in stool/urine. His coumadin dose was not changed last visit. He has not taken ABX, has not missed any doses and denies a fall.     Medication Sig  . albuterol  (2.5 MG/3ML) 0.083% neb soln Take 2.5 mg by nebulization every 6 (six) hours as needed   . allopurinol  300 MG tablet Take 1 tab daily. To prevent gout  . aspirin 81 MG tablet Take 81 mg  daily.  . bumetanide  2 MG tablet Take 2 mg  2  times daily.  [name] VITAMIN D Take 5,000 Units by mouth daily.   . clonazePAM  1 MG tablet TAKE 1 TAB AT BEDTIME AS NEEDED  . VITAMIN B 12 Take 1,500 mcg  daily.   [name] diltiazem (CARDIZEM CD) 120 MG  cap Take 1 cap daily.  . fenofibrate micronized  134 MG cap TAKE 1 CAPSULE EVERY DAY  . ferrous sulfate 325 (65 FE) MG tablet Take 325 mg  daily.  [name] ipratro-albut (DUONEB) 0.5-2.5 (3) MG/3ML SOLN Inhale 3 mLs  every 6  hrs as needed.   . IMDUR 30 MG 24 hr tab TAKE 1 TAB EVERY DAY  . levothyroxine  50 MCG tablet TAKE ONE TAB ONCE DAILY   . loratadine (CLARITIN) 10 MG tablet Take 10 mg by mouth daily.  . nitroGLYCERIN  0.4 MG SL tablet Place 1 tab SL every 5 min as needed   . pravastatin40 MG tablet Take 1 tablet (40 mg total) by mouth every morning.  [name] spironolactone  25 MG tablet   . tiotropium 18 MCG inhalation cap Place 18 mcg into inhaler and inhale daily as needed  . warfarin  5 MG tablet Take 1 tablet (5 mg total) by mouth daily. Take as directed   Past Medical History  Diagnosis Date  . Mechanical heart valve present     a. Hx mechanical St Jude AVR 2000.  . Cataracts, bilateral   . OSA (obstructive sleep apnea)    . Permanent atrial fibrillation   . Ischemic cardiomyopathy   . History of echocardiogram 04/17/2013    EF 40-45%; LV hypertrophy; LV mild-mod dilated; AV regurg.; LA severely diated, RA mildly dilated  . History of Doppler ultrasound 04/05/2013    LEAs; small distal abd. aoritc aneuysm is stable; fem-pop graft to R leg has 50-69% blockage  . History of nuclear stress test 05/14/2011    Dipyridamole; moderate perfusion defect in Basal Infrioer & Mid Inferior region - consistent w/infarct or scar; global LV systolic function mildly reduced; EKG negative for ischemia; low risk scan  . COPD (chronic obstructive pulmonary disease)   . Hyperlipidemia   . Hypertension   . Kidney cysts   . CKD (chronic kidney disease), stage III   . Hiatal hernia   . GERD (gastroesophageal reflux disease)   . Prediabetes   . Gout   . AAA (abdominal aortic aneurysm)   . Vitamin B deficiency   . Peripheral arterial disease     a. s/p aorto bifem BG;  b. 11/2013 PVA distal R Fem-fem stenosis;  c.  12/2013 PTA of dist R femoral bypass graft.  [name] CAD (coronary artery disease)     a. CABG '00. b. all SVGs occluded 2010. c. low risk Nuc 2012   . Chronic anticoagulation    Allergies  Allergen Reactions  . Chantix [Varenicline] Nausea And Vomiting  . Lyrica [Pregabalin]     Swelling, couldn't breathe  . Nsaids Other (See Comments)    Unknown   . Simvastatin     Pt said he had a "bleed out" after taking it  . Ziac [Bisoprolol-Hydrochlorothiazide]     fatigue    ROS Constitutional: Denies fever, chills, headaches, fatigue. Cardio: Denies chest pain, palpitations, irregular heartbeat, syncope, dyspnea, diaphoresis, orthopnea, PND, claudication, edema Respiratory: denies cough, dyspnea, DOE, pleurisy, hoarseness, laryngitis, wheezing.  Gastrointestinal: Denies dysphagia, heartburn, reflux, pain, cramps, nausea, diarrhea, constipation, hematemesis, melena, hematochezia Genitourinary: Denies dysuria, frequency,  hematuria, flank pain Musculoskeletal: Denies arthralgia, myalgia, stiffness, Jt. Swelling, pain, limp, strain/sprain. Skin: Denies rash, ecchymosis, petechial. Neuro: Denies Weakness, tremor, incoordination, spasms, paresthesia, pain Heme/Lymph: Denies Excessive bleeding, bruising, enlarged lymph nodes  Physical:  BP 138/66   P 72  T 98.1 F   R 16  Ht 5\' 8"    Wt 228 lb     BMI 34.68    General Appearance: Well nourished, in no apparent distress. ENT/Mouth: Nares clear with no erythema, swelling, mucus on turbinates. No ulcers, cracking, on lips. No erythema, swelling, or exudate on post pharynx.  Neck: Supple, thyroid normal.  Respiratory: CTAB   Cardio: Decreased HS.  Irregular, irregular rhythm, no murmurs, rubs or gallops. No edema  Abdomen: Soft, with bowl sounds. Non tender, no guarding, rebound, hernias, masses, or organomegaly.  Skin: Warm, dry without rashes, lesions, ecchymosis.  Neuro: Unremarkable  Assessment and plan:  1. Permanent atrial fibrillation  - diltiazem (CARDIZEM SR) 120 MG 12 hr capsule; Take 1 cap 2 x day for BP & heart  Dispense: 180 capsule; Refill: 99  2. Essential hypertension   3. Type 2 diabetes mellitus with chronic kidney disease   4. Long term current use of anticoagulant therapy  - Protime-INR  5. Mixed hyperlipidemia  - fenofibrate (TRICOR) 145 MG tablet; Take 1 tablet daily for Blood Fats  Dispense: 90 tablet; Refill: 99  Chronic anticoagulation- check INR and will adjust medication according to labs.  Discussed if patient falls to immediately contact office or go to ER. Discussed foods that can increase or decrease Coumadin levels. Patient understands to call the office before starting a new medication. Follow up in one month.

## 2014-12-18 ENCOUNTER — Other Ambulatory Visit: Payer: Self-pay | Admitting: *Deleted

## 2014-12-18 ENCOUNTER — Encounter: Payer: Self-pay | Admitting: Internal Medicine

## 2014-12-18 MED ORDER — DILTIAZEM HCL ER 60 MG PO CP12: 60.0000 mg | ORAL_CAPSULE | Freq: Two times a day (BID) | ORAL | Status: DC

## 2014-12-19 ENCOUNTER — Other Ambulatory Visit: Payer: Self-pay | Admitting: Physician Assistant

## 2014-12-19 MED ORDER — VERAPAMIL HCL 80 MG PO TABS: 80.0000 mg | ORAL_TABLET | Freq: Two times a day (BID) | ORAL | Status: DC

## 2014-12-24 ENCOUNTER — Telehealth: Payer: Self-pay | Admitting: Internal Medicine

## 2014-12-24 NOTE — Telephone Encounter (Signed)
Patient not sure which medicine to take.  Should patient DC taking the Dltiazem or ER? shoud he also take Verapamil?   Thank you, Katrina Judeth Horn [facility] [phone] Fax [phone]

## 2015-01-05 ENCOUNTER — Encounter: Payer: Self-pay | Admitting: *Deleted

## 2015-01-06 ENCOUNTER — Ambulatory Visit: Payer: Self-pay | Admitting: Physician Assistant

## 2015-01-08 ENCOUNTER — Ambulatory Visit (INDEPENDENT_AMBULATORY_CARE_PROVIDER_SITE_OTHER): Payer: PPO | Admitting: Physician Assistant

## 2015-01-08 VITALS — BP 122/78 | HR 56 | Temp 97.9°F | Resp 16 | Ht 68.0 in | Wt 228.0 lb

## 2015-01-08 DIAGNOSIS — I482 Chronic atrial fibrillation: Secondary | ICD-10-CM

## 2015-01-08 DIAGNOSIS — I4821 Permanent atrial fibrillation: Secondary | ICD-10-CM

## 2015-01-08 DIAGNOSIS — Z7901 Long term (current) use of anticoagulants: Secondary | ICD-10-CM

## 2015-01-08 MED ORDER — RANITIDINE HCL 300 MG PO TABS: 300.0000 mg | ORAL_TABLET | Freq: Every day | ORAL | Status: DC

## 2015-01-08 NOTE — Progress Notes (Signed)
Coumadin follow up  Patient is on Coumadin for Primary Diagnosis: Permanent atrial fibrillation [I48.2] and history of mechanical heart valve.  Patient's last INR is  Lab Results  Component Value Date   INR 2.48* 12/05/2014   INR 1.81* 11/27/2014   INR 3.77* 11/04/2014    Patient denies SOB, CP, dizziness, nose bleeds, easy bleeding, and blood in stool/urine. He has had a nonproductive cough for 1 month, no orthpnea, weight is down, no PND, no edema, he states the cough is worse after eating, and he does get full quickly and has cut back on his food.  His coumadin dose was not changed last visit, he is on 5 mg 5 days a week and 2.5mg  2 days a week. He has not taken ABX, has not missed any doses and denies a fall.     Current Outpatient Prescriptions on File Prior to Visit  Medication Sig Dispense Refill  . albuterol (PROVENTIL) (2.5 MG/3ML) 0.083% nebulizer solution Take 2.5 mg by nebulization every 6 (six) hours as needed for wheezing or shortness of breath.    . allopurinol (ZYLOPRIM) 300 MG tablet Take 1 tablet (300 mg total) by mouth daily. To prevent gout 90 tablet 99  . aspirin 81 MG tablet Take 81 mg by mouth daily.    . bumetanide (BUMEX) 2 MG tablet Take 2 mg by mouth 2 (two) times daily.    . Cholecalciferol (VITAMIN D PO) Take 5,000 Units by mouth daily.     . citalopram (CELEXA) 40 MG tablet Take 40 mg by mouth daily.  1  . Cyanocobalamin (VITAMIN B 12 PO) Take 1,500 mcg by mouth daily.     . fenofibrate (TRICOR) 145 MG tablet Take 1 tablet daily for Blood Fats 90 tablet 99  . ferrous sulfate 325 (65 FE) MG tablet Take 325 mg by mouth daily.    [name] ipratropium-albuterol (DUONEB) 0.5-2.5 (3) MG/3ML SOLN Inhale 3 mLs into the lungs every 6 (six) hours as needed.     . isosorbide mononitrate (IMDUR) 30 MG 24 hr tablet TAKE 1 TABLET BY MOUTH EVERY DAY 90 tablet 99  . levothyroxine (SYNTHROID, LEVOTHROID) 50 MCG tablet TAKE ONE TABLET BY MOUTH ONCE DAILY FOR THYROID 90 tablet 2  .  loratadine (CLARITIN) 10 MG tablet Take 10 mg by mouth daily.    . nitroGLYCERIN (NITROSTAT) 0.4 MG SL tablet Place 1 tablet (0.4 mg total) under the tongue every 5 (five) minutes as needed for chest pain. 25 tablet 5  . pravastatin (PRAVACHOL) 40 MG tablet Take 1 tablet (40 mg total) by mouth every morning. 30 tablet 5  . spironolactone (ALDACTONE) 25 MG tablet   3  . tiotropium (SPIRIVA) 18 MCG inhalation capsule Place 18 mcg into inhaler and inhale daily as needed (for shortness of breath).     . verapamil (CALAN) 80 MG tablet Take 1 tablet (80 mg total) by mouth 2 (two) times daily. 180 tablet 1  . warfarin (COUMADIN) 5 MG tablet Take 1 tablet (5 mg total) by mouth daily. Take as directed 90 tablet 3   No current facility-administered medications on file prior to visit.   Past Medical History  Diagnosis Date  . Mechanical heart valve present     a. Hx mechanical St Jude AVR 2000.  . Cataracts, bilateral   . OSA (obstructive sleep apnea)   . Permanent atrial fibrillation   . Ischemic cardiomyopathy   . History of echocardiogram 04/17/2013    EF 40-45%; LV hypertrophy; LV  mild-mod dilated; AV regurg.; LA severely diated, RA mildly dilated  . History of Doppler ultrasound 04/05/2013    LEAs; small distal abd. aoritc aneuysm is stable; fem-pop graft to R leg has 50-69% blockage  . History of nuclear stress test 05/14/2011    Dipyridamole; moderate perfusion defect in Basal Infrioer & Mid Inferior region - consistent w/infarct or scar; global LV systolic function mildly reduced; EKG negative for ischemia; low risk scan  . COPD (chronic obstructive pulmonary disease)   . Hyperlipidemia   . Hypertension   . Kidney cysts   . CKD (chronic kidney disease), stage III   . Hiatal hernia   . GERD (gastroesophageal reflux disease)   . Prediabetes   . Gout   . AAA (abdominal aortic aneurysm)   . Vitamin B deficiency   . Peripheral arterial disease     a. s/p aorto bifem BG;  b. 11/2013 PVA distal  R Fem-fem stenosis;  c. 12/2013 PTA of dist R femoral bypass graft.  [name] CAD (coronary artery disease)     a. CABG '00. b. all SVGs occluded 2010. c. low risk Nuc 2012   . Chronic anticoagulation    Allergies  Allergen Reactions  . Chantix [Varenicline] Nausea And Vomiting  . Lyrica [Pregabalin]     Swelling, couldn't breathe  . Nsaids Other (See Comments)    Unknown   . Simvastatin     Pt said he had a "bleed out" after taking it  . Ziac [Bisoprolol-Hydrochlorothiazide]     fatigue   ROS  Physical: Blood pressure 122/78, pulse 56, temperature 97.9 F (36.6 C), resp. rate 16, height 5\' 8"  (1.727 m), weight 228 lb (103.42 kg). Filed Weights   01/08/15 1519  Weight: 228 lb (103.42 kg)   General Appearance: In no apparent distress. ENT/Mouth: Nares clear with no erythema, swelling, mucus on turbinates. No ulcers, cracking, on lips. No erythema, swelling, or exudate on post pharynx.  Neck: Supple, thyroid normal.  Respiratory: Decreased breath sounds but no wheezing/rhochi/rales.    Cardio: Irregular, irregular rhythm, with systolic mechanical click without rubs or gallops. No edema  Abdomen: Soft, with bowl sounds. nontender, no guarding, rebound, hernias, masses, or organomegaly.  Skin: Warm, dry without rashes, lesions, ecchymosis.  Neuro: Unremarkable  Assessment and plan: Chronic anticoagulation- check INR and will adjust medication according to labs.  Discussed if patient falls to immediately contact office or go to ER. Discussed foods that can increase or decrease Coumadin levels. Patient understands to call the office before starting a new medication. Follow up in one month.   Cough-  O2 99% RA, lungs CTAB, worse after food, likely GERD related, no blood in stool/dark black stool- will start on zantac 300mg  QHS- if this does not help may be gastroparesis.   States he has not felt so well in a long time.

## 2015-01-08 NOTE — Patient Instructions (Signed)
Silent reflux/GERD can cause a cough WITHOUT heart burn because the esophagus that goes to the stomach and trachea that goes to the lungs are very close and when you lay down the acid can irritate your throat and lungs. This can cause hoarseness, cough, and wheezing. Please stop any alcohol or anti-inflammatories like aleve/advil/ibuprofen and start an over the counter Take the zantac at night for at least 1-2 weeks.    If you ever have a cough that does not go away after trying these things please make a follow up visit for further evaluation or we can refer you to a specialist. Or if you ever have shortness of breath or chest pain go to the ER.    Gastroparesis  Gastroparesis is also called slowed stomach emptying (delayed gastric emptying). It is a condition in which the stomach takes too long to empty its contents. It often happens in people with diabetes.  CAUSES  Gastroparesis happens when nerves to the stomach are damaged or stop working. When the nerves are damaged, the muscles of the stomach and intestines do not work normally. The movement of food is slowed or stopped. High blood glucose (sugar) causes changes in nerves and can damage the blood vessels that carry oxygen and nutrients to the nerves. RISK FACTORS  Diabetes.  Post-viral syndromes.  Eating disorders (anorexia, bulimia).  Surgery on the stomach or vagus nerve.  Gastroesophageal reflux disease (rarely).  Smooth muscle disorders (amyloidosis, scleroderma).  Metabolic disorders, including hypothyroidism.  Parkinson disease. SYMPTOMS   Heartburn.  Feeling sick to your stomach (nausea).  Vomiting of undigested food.  An early feeling of fullness when eating.  Weight loss.  Abdominal bloating.  Erratic blood glucose levels.  Lack of appetite.  Gastroesophageal reflux.  Spasms of the stomach wall. Complications can include:  Bacterial overgrowth in stomach. Food stays in the stomach and can ferment and  cause bacteria to grow.  Weight loss due to difficulty digesting and absorbing nutrients.  Vomiting.  Obstruction in the stomach. Undigested food can harden and cause nausea and vomiting.  Blood glucose fluctuations caused by inconsistent food absorption. DIAGNOSIS  The diagnosis of gastroparesis is confirmed through one or more of the following tests:  Barium X-rays and scans. These tests look at how long it takes for food to move through the stomach.  Gastric manometry. This test measures electrical and muscular activity in the stomach. A thin tube is passed down the throat into the stomach. The tube contains a wire that takes measurements of the stomach's electrical and muscular activity as it digests liquids and solid food.  Endoscopy. This procedure is done with a long, thin tube called an endoscope. It is passed through the mouth and gently down the esophagus into the stomach. This tube helps the caregiver look at the lining of the stomach to check for any abnormalities.  Ultrasonography. This can rule out gallbladder disease or pancreatitis. This test will outline and define the shape of the gallbladder and pancreas. TREATMENT   Treatments may include:  Exercise.  Medicines to control nausea and vomiting.  Medicines to stimulate stomach muscles.  Changes in what and when you eat.  Having smaller meals more often.  Eating low-fiber forms of high-fiber foods, such as eating cooked vegetables instead of raw vegetables.  Eating low-fat foods.  Consuming liquids, which are easier to digest.  In severe cases, feeding tubes and intravenous (IV) feeding may be needed. It is important to note that in most cases, treatment does  not cure gastroparesis. It is usually a lasting (chronic) condition. Treatment helps you manage the underlying condition so that you can be as healthy and comfortable as possible. Other treatments  A gastric neurostimulator has been developed to assist  people with gastroparesis. The battery-operated device is surgically implanted. It emits mild electrical pulses to help improve stomach emptying and to control nausea and vomiting.  The use of botulinum toxin has been shown to improve stomach emptying by decreasing the prolonged contractions of the muscle between the stomach and the small intestine (pyloric sphincter). The benefits are temporary. SEEK MEDICAL CARE IF:   You have diabetes and you are having problems keeping your blood glucose in goal range.  You are having nausea, vomiting, bloating, or early feelings of fullness with eating.  Your symptoms do not change with a change in diet. Document Released: 10/04/2005 Document Revised: 01/29/2013 Document Reviewed: 03/13/2009 [facility] Patient Information 2015 [place], [place]. This information is not intended to replace advice given to you by your health care provider. Make sure you discuss any questions you have with your health care provider.

## 2015-01-09 LAB — PROTIME-INR
INR: 2.37 — ABNORMAL HIGH (ref <1.50)
Prothrombin Time: 25.9 seconds — ABNORMAL HIGH (ref 11.6–15.2)

## 2015-01-14 ENCOUNTER — Encounter: Payer: Self-pay | Admitting: Physician Assistant

## 2015-01-26 ENCOUNTER — Other Ambulatory Visit: Payer: Self-pay | Admitting: Emergency Medicine

## 2015-02-10 ENCOUNTER — Encounter: Payer: Self-pay | Admitting: Internal Medicine

## 2015-02-10 ENCOUNTER — Ambulatory Visit (INDEPENDENT_AMBULATORY_CARE_PROVIDER_SITE_OTHER): Payer: PPO | Admitting: Internal Medicine

## 2015-02-10 VITALS — BP 134/82 | HR 60 | Temp 97.7°F | Resp 16 | Ht 68.0 in | Wt 225.2 lb

## 2015-02-10 DIAGNOSIS — Z79899 Other long term (current) drug therapy: Secondary | ICD-10-CM

## 2015-02-10 DIAGNOSIS — Z9119 Patient's noncompliance with other medical treatment and regimen: Secondary | ICD-10-CM | POA: Insufficient documentation

## 2015-02-10 DIAGNOSIS — M109 Gout, unspecified: Secondary | ICD-10-CM

## 2015-02-10 DIAGNOSIS — Z7901 Long term (current) use of anticoagulants: Secondary | ICD-10-CM

## 2015-02-10 DIAGNOSIS — I10 Essential (primary) hypertension: Secondary | ICD-10-CM

## 2015-02-10 DIAGNOSIS — E559 Vitamin D deficiency, unspecified: Secondary | ICD-10-CM

## 2015-02-10 DIAGNOSIS — Z91199 Patient's noncompliance with other medical treatment and regimen due to unspecified reason: Secondary | ICD-10-CM | POA: Insufficient documentation

## 2015-02-10 DIAGNOSIS — E1122 Type 2 diabetes mellitus with diabetic chronic kidney disease: Secondary | ICD-10-CM

## 2015-02-10 DIAGNOSIS — I4821 Permanent atrial fibrillation: Secondary | ICD-10-CM

## 2015-02-10 DIAGNOSIS — E782 Mixed hyperlipidemia: Secondary | ICD-10-CM

## 2015-02-10 NOTE — Progress Notes (Signed)
Patient ID: Robert Stanton, male   DOB: [birth year], 74 y.o.   MRN: [id number]   This very nice 74 y.o. MWM  presents for 3 month follow up with Hypertension, Hyperlipidemia, Pre-Diabetes and Vitamin D Deficiency.    Patient is treated for HTN for years & BP has been controlled at home. Patient ASCAD and ch Afib and had CABG & AoVR (st Jude) in 05/1999 and has been on coumadin since. Today's BP: 134/82 mmHg. Patient has had no complaints of any cardiac type chest pain, palpitations, dyspnea/orthopnea/PND, dizziness, claudication, or dependent edema. 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 Patient denies myalgias or other med SE's. Last Lipids were not at goal - Total Chol 203; HDL 35; elevated LDL  128; Triglycerides 201 on 10/02/2014.   Also, the patient has history of Morbid Obesity (BMI 34+) due to poor dietary choices and consequent PreDiabetes and has had no symptoms of reactive hypoglycemia, diabetic polys, paresthesias or visual blurring.  Last A1c was  6.3% on 10/02/2014.    Further, the patient also has history of Vitamin D Deficiency of 22 in 2009 and supplements vitamin D without any suspected side-effects. Last vitamin D was 42 on 10/02/2014.  Medication Sig  . albuterol (PROVENTIL) (2.5 MG/3ML)  neb soln Take 2.5 mg by nebulization every 6 (six) hours as needed for wheezing or shortness of breath.  . allopurinol (ZYLOPRIM) 300 MG tablet Take 1 tablet (300 mg total) by mouth daily. To prevent gout  . aspirin 81 MG tablet Take 81 mg by mouth daily.  . bumetanide (BUMEX) 2 MG tablet Take 2 mg by mouth 2 (two) times daily.  [name] VITAMIN D Take 5,000 Units by mouth daily.   . citalopram 40 MG tablet Take 40 mg by mouth daily.  [name] VITAMIN B 12  Take 1,500 mcg by mouth daily.   . fenofibrate  145 MG tablet Take 1 tablet daily for Blood Fats  . ferrous sulfate 325 (65 FE) MG tablet Take 325 mg by mouth daily.  [name]  ipratropium-albuterol (DUONEB) 0.5-2.5 /3ML Inhale 3 mLs into the lungs every 6 (six) hours as needed.   . isosorbide mononitrate  30 MG 24 hr tab TAKE 1 TABLET BY MOUTH EVERY DAY  . levothyroxine  50 MCG tablet TAKE ONE TABLET BY MOUTH ONCE DAILY FOR THYROID  . loratadine  10 MG tablet Take 10 mg by mouth daily.  [name] NITROSTAT 0.4 MG SL tablet Place 1 tablet (0.4 mg total) under the tongue every 5 (five) minutes as needed for chest pain.  . pravastatin  40 MG tablet Take 1 tablet (40 mg total) by mouth every morning.  . ranitidine  300 MG tablet Take 1 tablet (300 mg total) by mouth at bedtime.  [name] spironolactone 25 MG tablet   . tiotropium 18 MCG inhalation capsule Place 18 mcg into inhaler and inhale daily as needed (for shortness of breath).   . verapamil 80 MG tablet Take 1 tablet (80 mg total) by mouth 2 (two) times daily.  [name] warfarin  5 MG tablet Take 1 tablet (5 mg total) by mouth daily. Take as directed   Allergies  Allergen Reactions  . Chantix [Varenicline] Nausea And Vomiting  . Lyrica [Pregabalin]     Swelling, couldn't breathe  . Nsaids Other (See Comments)    Unknown   . Simvastatin     Pt said he had a "  bleed out" after taking it  . Ziac [Bisoprolol-Hydrochlorothiazide]     fatigue   PMHx:   Past Medical History  Diagnosis Date  . Mechanical heart valve present     a. Hx mechanical St Jude AVR 2000.  . Cataracts, bilateral   . OSA (obstructive sleep apnea)   . Permanent atrial fibrillation   . Ischemic cardiomyopathy   . History of echocardiogram 04/17/2013    EF 40-45%; LV hypertrophy; LV mild-mod dilated; AV regurg.; LA severely diated, RA mildly dilated  . History of Doppler ultrasound 04/05/2013    LEAs; small distal abd. aoritc aneuysm is stable; fem-pop graft to R leg has 50-69% blockage  . History of nuclear stress test 05/14/2011    Dipyridamole; moderate perfusion defect in Basal Infrioer & Mid Inferior region - consistent w/infarct or scar; global LV systolic  function mildly reduced; EKG negative for ischemia; low risk scan  . COPD (chronic obstructive pulmonary disease)   . Hyperlipidemia   . Hypertension   . Kidney cysts   . CKD (chronic kidney disease), stage III   . Hiatal hernia   . GERD (gastroesophageal reflux disease)   . Prediabetes   . Gout   . AAA (abdominal aortic aneurysm)   . Vitamin B deficiency   . Peripheral arterial disease     a. s/p aorto bifem BG;  b. 11/2013 PVA distal R Fem-fem stenosis;  c. 12/2013 PTA of dist R femoral bypass graft.  [name] CAD (coronary artery disease)     a. CABG '00. b. all SVGs occluded 2010. c. low risk Nuc 2012   . Chronic anticoagulation    Immunization History  Administered Date(s) Administered  . DT 01/25/2012  . Influenza Split 06/28/2013  . Influenza, High Dose Seasonal PF 08/02/2014  . Pneumococcal Conjugate-13 03/25/2014  . Pneumococcal Polysaccharide-23 08/11/2012  . Zoster 10/30/2010   Past Surgical History  Procedure Laterality Date  . Stents in kidneys    . Coronary artery bypass graft  2000    3 vessel; LIMA to LAD; RCA, OM  . Shunts in femoral arteries    . Cardioversion  8/282012  . Aortic valve replacement      St. Jude  . Femoral bypass    . Cardiac catheterization  11/25/2008    loss of 2/3 (RCA, OM) bypass grafts with patent internal mammary artery to LAD; large collateral filling of RCA ; osteal narrowing of circumflex of ~50%  . Lower extremity angiogram N/A 12/03/2013    Procedure: LOWER EXTREMITY ANGIOGRAM;  Surgeon: Lorretta Harp, MD;  Location: [facility] C[facility] LAB;  Service: Cardiovascular;  Laterality: N/A;  . Lower extremity angiogram N/A 01/07/2014    Procedure: LOWER EXTREMITY ANGIOGRAM;  Surgeon: Lorretta Harp, MD;  Location: [facility] CATH LAB;  Service: Cardiovascular;  Laterality: N/A;   FHx:    Reviewed / unchanged  SHx:    Reviewed / unchanged  Systems Review:  Constitutional: Denies fever, chills, wt changes, headaches, insomnia, fatigue, night sweats, change  in appetite. Eyes: Denies redness, blurred vision, diplopia, discharge, itchy, watery eyes.  ENT: Denies discharge, congestion, post nasal drip, epistaxis, sore throat, earache, hearing loss, dental pain, tinnitus, vertigo, sinus pain, snoring.  CV: Denies chest pain, palpitations, irregular heartbeat, syncope, dyspnea, diaphoresis, orthopnea, PND, claudication or edema. Respiratory: denies cough, dyspnea, DOE, pleurisy, hoarseness, laryngitis, wheezing.  Gastrointestinal: Denies dysphagia, odynophagia, heartburn, reflux, water brash, abdominal pain or cramps, nausea, vomiting, bloating, diarrhea, constipation, hematemesis, melena, hematochezia  or hemorrhoids. Genitourinary: Denies dysuria,  frequency, urgency, nocturia, hesitancy, discharge, hematuria or flank pain. Musculoskeletal: Denies arthralgias, myalgias, stiffness, jt. swelling, pain, limping or strain/sprain.  Skin: Denies pruritus, rash, hives, warts, acne, eczema or change in skin lesion(s). Neuro: No weakness, tremor, incoordination, spasms, paresthesia or pain. Psychiatric: Denies confusion, memory loss or sensory loss. Endo: Denies change in weight, skin or hair change.  Heme/Lymph: No excessive bleeding, bruising or enlarged lymph nodes.  Physical Exam  BP 134/82   Pulse 60  Temp 97.7 F   Resp 16  Ht 5\' 8"    Wt 225 lb 3.2 oz     BMI 34.25  Appears Over  nourished and in no distress. Pungent stale Tobacco fetor & BO.  Eyes: PERRLA, EOMs, conjunctiva no swelling or erythema. Sinuses: No frontal/maxillary tenderness ENT/Mouth: EAC's clear, TM's nl w/o erythema, bulging. Nares clear w/o erythema, swelling, exudates. Oropharynx clear without erythema or exudates. Oral hygiene is good. Tongue normal, non obstructing. Hearing intact.  Neck: Supple. Thyroid nl. Car 2+/2+ without bruits, nodes or JVD. Chest: Respirations nl with BS clear & equal w/o rales, rhonchi, wheezing or stridor.  Cor: Heart sounds normal w/ irregular  rate and rhythm with prosthetic HS and soft flow Murmer. Peripheral pulses normal and equal  With 1 (+)  edema.  Abdomen: Soft & bowel sounds normal. Non-tender w/o guarding, rebound, hernias, masses, or organomegaly.  Lymphatics: Unremarkable.  Musculoskeletal: Full ROM all peripheral extremities, joint stability, 5/5 strength, and normal gait.  Skin: Warm, dry without exposed rashes, lesions or ecchymosis apparent.  Neuro: Cranial nerves intact, reflexes equal bilaterally. Sensory-motor testing grossly intact. Tendon reflexes grossly intact.  Pysch: Alert & oriented x 3.  Insight and judgement nl & appropriate. No ideations.  Assessment and Plan:  1. Essential hypertension  - BASIC METABOLIC PANEL WITH GFR - TSH  2. Hyperlipidemia  - Lipid panel  3. Type 2 diabetes mellitus with chronic kidney disease  - Hemoglobin A1c - Insulin, random  4. Vitamin D deficiency  - Vit D  25 hydroxy   5. Permanent atrial fibrillation  - Protime-INR  6. Gout  - Uric acid  7. Long term current use of anticoagulant therapy  - Protime-INR  8. Encounter for long-term (current) use of medications  - BASIC METABOLIC PANEL WITH GFR - Hepatic function panel - Magnesium   Recommended regular exercise, BP monitoring, weight control, and discussed med and SE's. Recommended labs to assess and monitor clinical status. Further disposition pending results of labs. Over 30 minutes of exam, counseling, chart review was performed

## 2015-02-10 NOTE — Patient Instructions (Addendum)
+++++++++++++++++++++++++++++++++++++++++++++++++++++++++++++++++    Recommend Low dose or baby Aspirin 81 mg daily   To reduce risk of Colon Cancer 20 %, Skin Cancer 26 % ,   Melanoma 46% and Pancreatic cancer 60%   +++++++++++++++++++++++++++++++++++++++  Vitamin D goal is between 70-100.   Please make sure that you are taking your Vitamin D as directed.   It is very important as a natural antiinflammatory   helping hair, skin, and nails, as well as   reducing stroke and heart attack risk.   It helps your bones and helps with mood.  It also decreases numerous cancer risks   so please take it as directed.   Low Vit D Is associated with a 200-300% higher risk for CANCER   and 200-300% higher risk for HEART ATTACK & STROKE.    .................................................................................................[name]  It is also associated with higher death rate at younger ages,   autoimmune diseases like Rheumatoid arthritis, Lupus, Multiple Sclerosis.     Also many other serious conditions, like depression, Alzheimer's  Dementia, infertility, muscle aches, fatigue, fibromyalgia - just to name a few.  +++++++++++++++++++++++++++++++++++++++++++++  Recommend the book "The END of DIETING" by Dr Excell Seltzer   & the book "The END of DIABETES " by Dr Excell Seltzer  At [email or web address] - get book & Audio CD's     Being diabetic has a  300% increased risk for heart attack, stroke, cancer, and alzheimer- type vascular dementia. It is very important that you work harder with diet by avoiding all foods that are white. Avoid white rice (brown & wild rice is OK), white potatoes (sweetpotatoes in moderation is OK), White bread or wheat bread or anything made out of white flour like bagels, donuts, rolls, buns, biscuits, cakes, pastries, cookies, pizza crust, and pasta (made from white flour & egg whites) - vegetarian pasta or spinach or wheat pasta is OK. Multigrain breads like  Arnold's or [employer], or multigrain sandwich thins or flatbreads.  Diet, exercise and weight loss can reverse and cure diabetes in the early stages.  Diet, exercise and weight loss is very important in the control and prevention of complications of diabetes which affects every system in your body, ie. Brain - dementia/stroke, eyes - glaucoma/blindness, heart - heart attack/heart failure, kidneys - dialysis, stomach - gastric paralysis, intestines - malabsorption, nerves - severe painful neuritis, circulation - gangrene & loss of a leg(s), and finally cancer and Alzheimers.    I recommend avoid fried & greasy foods,  sweets/candy, white rice (brown or wild rice or Quinoa is OK), white potatoes (sweet potatoes are OK) - anything made from white flour - bagels, doughnuts, rolls, buns, biscuits,white and wheat breads, pizza crust and traditional pasta made of white flour & egg white(vegetarian pasta or spinach or wheat pasta is OK).  Multi-grain bread is OK - like multi-grain flat bread or sandwich thins. Avoid alcohol in excess. Exercise is also important.    Eat all the vegetables you want - avoid meat, especially red meat and dairy - especially cheese.  Cheese is the most concentrated form of trans-fats which is the worst thing to clog up our arteries. Veggie cheese is OK which can be found in the fresh produce section at [facility] or [employer] or [employer]  ++++++++++++++++++++++++++++++++++++++++++

## 2015-02-11 LAB — HEPATIC FUNCTION PANEL
ALT: 8 U/L (ref 0–53)
AST: 17 U/L (ref 0–37)
Albumin: 4.1 g/dL (ref 3.5–5.2)
Alkaline Phosphatase: 58 U/L (ref 39–117)
BILIRUBIN INDIRECT: 0.4 mg/dL (ref 0.2–1.2)
BILIRUBIN TOTAL: 0.5 mg/dL (ref 0.2–1.2)
Bilirubin, Direct: 0.1 mg/dL (ref 0.0–0.3)
TOTAL PROTEIN: 7.2 g/dL (ref 6.0–8.3)

## 2015-02-11 LAB — LIPID PANEL
CHOLESTEROL: 177 mg/dL (ref 0–200)
HDL: 35 mg/dL — AB (ref >=40)
LDL Cholesterol: 111 mg/dL — ABNORMAL HIGH (ref 0–99)
TRIGLYCERIDES: 155 mg/dL — AB (ref <150)
Total CHOL/HDL Ratio: 5.1 Ratio
VLDL: 31 mg/dL (ref 0–40)

## 2015-02-11 LAB — BASIC METABOLIC PANEL WITH GFR
BUN: 20 mg/dL (ref 6–23)
CO2: 28 meq/L (ref 19–32)
Calcium: 9.1 mg/dL (ref 8.4–10.5)
Chloride: 97 mEq/L (ref 96–112)
Creat: 1.91 mg/dL — ABNORMAL HIGH (ref 0.50–1.35)
GFR, Est African American: 40 mL/min — ABNORMAL LOW
GFR, Est Non African American: 34 mL/min — ABNORMAL LOW
Glucose, Bld: 112 mg/dL — ABNORMAL HIGH (ref 70–99)
POTASSIUM: 4.5 meq/L (ref 3.5–5.3)
Sodium: 136 mEq/L (ref 135–145)

## 2015-02-11 LAB — VITAMIN D 25 HYDROXY (VIT D DEFICIENCY, FRACTURES): Vit D, 25-Hydroxy: 68 ng/mL (ref 30–100)

## 2015-02-11 LAB — MAGNESIUM: MAGNESIUM: 1.7 mg/dL (ref 1.5–2.5)

## 2015-02-11 LAB — INSULIN, RANDOM: Insulin: 47.4 u[IU]/mL — ABNORMAL HIGH (ref 2.0–19.6)

## 2015-02-11 LAB — HEMOGLOBIN A1C
Hgb A1c MFr Bld: 6.2 % — ABNORMAL HIGH (ref <5.7)
Mean Plasma Glucose: 131 mg/dL — ABNORMAL HIGH (ref <117)

## 2015-02-11 LAB — PROTIME-INR
INR: 2.9 — AB (ref <1.50)
PROTHROMBIN TIME: 30.3 s — AB (ref 11.6–15.2)

## 2015-02-11 LAB — TSH: TSH: 2.611 u[IU]/mL (ref 0.350–4.500)

## 2015-02-11 LAB — URIC ACID: Uric Acid, Serum: 6.4 mg/dL (ref 4.0–7.8)

## 2015-02-25 ENCOUNTER — Other Ambulatory Visit: Payer: Self-pay | Admitting: Internal Medicine

## 2015-03-12 ENCOUNTER — Ambulatory Visit (INDEPENDENT_AMBULATORY_CARE_PROVIDER_SITE_OTHER): Payer: PPO | Admitting: Internal Medicine

## 2015-03-12 ENCOUNTER — Encounter: Payer: Self-pay | Admitting: Internal Medicine

## 2015-03-12 VITALS — BP 120/64 | HR 68 | Temp 97.7°F | Resp 16 | Ht 68.0 in | Wt 226.0 lb

## 2015-03-12 DIAGNOSIS — I4821 Permanent atrial fibrillation: Secondary | ICD-10-CM

## 2015-03-12 DIAGNOSIS — I482 Chronic atrial fibrillation: Secondary | ICD-10-CM

## 2015-03-12 DIAGNOSIS — Z91199 Patient's noncompliance with other medical treatment and regimen due to unspecified reason: Secondary | ICD-10-CM

## 2015-03-12 DIAGNOSIS — Z79899 Other long term (current) drug therapy: Secondary | ICD-10-CM

## 2015-03-12 DIAGNOSIS — I739 Peripheral vascular disease, unspecified: Secondary | ICD-10-CM

## 2015-03-12 DIAGNOSIS — B351 Tinea unguium: Secondary | ICD-10-CM

## 2015-03-12 DIAGNOSIS — Z9119 Patient's noncompliance with other medical treatment and regimen: Secondary | ICD-10-CM

## 2015-03-12 NOTE — Progress Notes (Signed)
Patient ID: Robert Stanton, male   DOB: January 12, 1941, 74 y.o.   MRN: [id number]  Assessment and Plan:   1. Permanent atrial fibrillation -cont coumadin - Protime-INR  2. Peripheral arterial disease -cont coumadin -stop smoking -walk - Protime-INR  3. Encounter for long-term (current) use of medications -cont coumadin - Protime-INR  4. Poor compliance -quit smoking  5. Onychomycosis - Ambulatory referral to Podiatry     HPI 74 y.o.male presents for 1 month follow up of coumadin and long term anticoagulant usage. Patient reports that they have been doing well. He has not missed any doses.  He has no bleeding.  He reports no nose bleeds, hematuria, or melena or hematochezia.  male is taking their medication.  They are not having difficulty with their medications.  They report no adverse reactions.  He does report eating a lot of veggies.   He reports that his toes on his right foot are bothering him.  He does report very thick nails and feels like there are some blisters on the skin.    Past Medical History  Diagnosis Date  . Mechanical heart valve present     a. Hx mechanical St Jude AVR 2000.  . Cataracts, bilateral   . OSA (obstructive sleep apnea)   . Permanent atrial fibrillation   . Ischemic cardiomyopathy   . History of echocardiogram 04/17/2013    EF 40-45%; LV hypertrophy; LV mild-mod dilated; AV regurg.; LA severely diated, RA mildly dilated  . History of Doppler ultrasound 04/05/2013    LEAs; small distal abd. aoritc aneuysm is stable; fem-pop graft to R leg has 50-69% blockage  . History of nuclear stress test 05/14/2011    Dipyridamole; moderate perfusion defect in Basal Infrioer & Mid Inferior region - consistent w/infarct or scar; global LV systolic function mildly reduced; EKG negative for ischemia; low risk scan  . COPD (chronic obstructive pulmonary disease)   . Hyperlipidemia   . Hypertension   . Kidney cysts   . CKD (chronic kidney disease), stage III    . Hiatal hernia   . GERD (gastroesophageal reflux disease)   . Prediabetes   . Gout   . AAA (abdominal aortic aneurysm)   . Vitamin B deficiency   . Peripheral arterial disease     a. s/p aorto bifem BG;  b. 11/2013 PVA distal R Fem-fem stenosis;  c. 12/2013 PTA of dist R femoral bypass graft.  [name] CAD (coronary artery disease)     a. CABG '00. b. all SVGs occluded 2010. c. low risk Nuc 2012   . Chronic anticoagulation      Allergies  Allergen Reactions  . Chantix [Varenicline] Nausea And Vomiting  . Lyrica [Pregabalin]     Swelling, couldn't breathe  . Nsaids Other (See Comments)    Unknown   . Simvastatin     Pt said he had a "bleed out" after taking it  . Ziac [Bisoprolol-Hydrochlorothiazide]     fatigue      Current Outpatient Prescriptions on File Prior to Visit  Medication Sig Dispense Refill  . albuterol (PROVENTIL) (2.5 MG/3ML) 0.083% nebulizer solution Take 2.5 mg by nebulization every 6 (six) hours as needed for wheezing or shortness of breath.    . allopurinol (ZYLOPRIM) 300 MG tablet Take 1 tablet (300 mg total) by mouth daily. To prevent gout 90 tablet 99  . aspirin 81 MG tablet Take 81 mg by mouth daily.    . bumetanide (BUMEX) 2 MG tablet Take 2 mg by  mouth 2 (two) times daily.    . Cholecalciferol (VITAMIN D PO) Take 5,000 Units by mouth daily.     . citalopram (CELEXA) 40 MG tablet TAKE 1 TABLET BY MOUTH EVERY DAY 90 tablet 1  . Cyanocobalamin (VITAMIN B 12 PO) Take 1,500 mcg by mouth daily.     . fenofibrate (TRICOR) 145 MG tablet Take 1 tablet daily for Blood Fats 90 tablet 99  . ferrous sulfate 325 (65 FE) MG tablet Take 325 mg by mouth daily.    [name] ipratropium-albuterol (DUONEB) 0.5-2.5 (3) MG/3ML SOLN Inhale 3 mLs into the lungs every 6 (six) hours as needed.     . isosorbide mononitrate (IMDUR) 30 MG 24 hr tablet TAKE 1 TABLET BY MOUTH EVERY DAY 90 tablet 99  . levothyroxine (SYNTHROID, LEVOTHROID) 50 MCG tablet TAKE 1 TABLET BY MOUTH EVERY DAY 90 tablet 0   . loratadine (CLARITIN) 10 MG tablet Take 10 mg by mouth daily.    . nitroGLYCERIN (NITROSTAT) 0.4 MG SL tablet Place 1 tablet (0.4 mg total) under the tongue every 5 (five) minutes as needed for chest pain. 25 tablet 5  . pravastatin (PRAVACHOL) 40 MG tablet Take 1 tablet (40 mg total) by mouth every morning. 30 tablet 5  . ranitidine (ZANTAC) 300 MG tablet Take 1 tablet (300 mg total) by mouth at bedtime. 30 tablet 1  . spironolactone (ALDACTONE) 25 MG tablet   3  . tiotropium (SPIRIVA) 18 MCG inhalation capsule Place 18 mcg into inhaler and inhale daily as needed (for shortness of breath).     . verapamil (CALAN) 80 MG tablet Take 1 tablet (80 mg total) by mouth 2 (two) times daily. 180 tablet 1  . warfarin (COUMADIN) 5 MG tablet Take 1 tablet (5 mg total) by mouth daily. Take as directed 90 tablet 3   No current facility-administered medications on file prior to visit.    ROS: all negative except above.   Physical Exam: Filed Weights   03/12/15 1039  Weight: 226 lb (102.513 kg)   BP 120/64 mmHg  Pulse 68  Temp(Src) 97.7 F (36.5 C)  Resp 16  Ht 5\' 8"  (1.727 m)  Wt 226 lb (102.513 kg)  BMI 34.37 kg/m2 General Appearance: Well developed well nourished, non-toxic appearing in no apparent distress. Eyes: PERRLA, EOMs, conjunctiva w/ no swelling or erythema or discharge Sinuses: No Frontal/maxillary tenderness ENT/Mouth: Ear canals clear without swelling or erythema.  TM's normal bilaterally with no retractions, bulging, or loss of landmarks.   Neck: Supple, thyroid normal, no notable JVD  Respiratory: Respiratory effort normal, Clear breath sounds anteriorly and posteriorly bilaterally without rales, rhonchi, wheezing or stridor. No retractions or accessory muscle usage. Cardio:ireg ireg, with soft click heard best on left sternal border no GR.   Abdomen: Soft, + BS.  Non tender, no guarding, rebound, hernias, masses.  Musculoskeletal: Full ROM, 5/5 strength, normal gait.  Skin:  Warm, dry without rashes, fungal nail infection of bilateral toes.  There is some mild TTP to the 4th and 5th toes on the right foot with no evidence of ulcers  Neuro: Awake and oriented X 3, Cranial nerves intact. Normal muscle tone, no cerebellar symptoms. Sensation intact.  Psych: normal affect, Insight and Judgment appropriate.     FORCUCCI, Temari Schooler, PA-C 10:54 AM [facility]

## 2015-03-13 ENCOUNTER — Other Ambulatory Visit: Payer: Self-pay | Admitting: Internal Medicine

## 2015-03-13 LAB — PROTIME-INR
INR: 2.39 — ABNORMAL HIGH (ref <1.50)
PROTHROMBIN TIME: 26.1 s — AB (ref 11.6–15.2)

## 2015-03-20 ENCOUNTER — Ambulatory Visit (INDEPENDENT_AMBULATORY_CARE_PROVIDER_SITE_OTHER): Payer: PPO | Admitting: Podiatry

## 2015-03-20 ENCOUNTER — Encounter: Payer: Self-pay | Admitting: Podiatry

## 2015-03-20 ENCOUNTER — Ambulatory Visit (INDEPENDENT_AMBULATORY_CARE_PROVIDER_SITE_OTHER): Payer: PPO

## 2015-03-20 DIAGNOSIS — M779 Enthesopathy, unspecified: Secondary | ICD-10-CM

## 2015-03-20 DIAGNOSIS — M898X9 Other specified disorders of bone, unspecified site: Secondary | ICD-10-CM | POA: Diagnosis not present

## 2015-03-20 DIAGNOSIS — B351 Tinea unguium: Secondary | ICD-10-CM

## 2015-03-20 DIAGNOSIS — Q828 Other specified congenital malformations of skin: Secondary | ICD-10-CM | POA: Diagnosis not present

## 2015-03-20 DIAGNOSIS — M79673 Pain in unspecified foot: Secondary | ICD-10-CM

## 2015-03-20 NOTE — Progress Notes (Signed)
   Subjective:    Patient ID: Robert Stanton, male    DOB: [birth year], 74 y.o.   MRN: [id number]  HPI Comments: "I have a problem with this toe"  Patient c/o tender 5th toe interdigital right for several months. The area is callused. Has seen 2 docs-one said was a blood flow issue the other said it was a piece of nail growing out of skin. No treatment.  Toe Pain  Associated symptoms include numbness.      Review of Systems  Constitutional: Positive for chills.  Eyes: Positive for visual disturbance.  Respiratory: Positive for cough, shortness of breath and wheezing.   Cardiovascular: Positive for palpitations and leg swelling.  Endocrine: Positive for cold intolerance and heat intolerance.  Genitourinary: Positive for hematuria.  Musculoskeletal: Positive for back pain and arthralgias.  Allergic/Immunologic: Positive for environmental allergies.  Neurological: Positive for dizziness, tremors, seizures, light-headedness, numbness and headaches.  Hematological: Bruises/bleeds easily.  All other systems reviewed and are negative.      Objective:   Physical Exam: I have reviewed his past medical history medications allergies surgery social history and review of systems. Pulses are strongly palpable. Neurologic sensorium is intact per Semmes-Weinstein monofilament. Deep tendon reflexes are intact bilateral. Muscle strength +5 over 5 dorsiflexion plantar flexors and inverters everters. Orthopedic evaluation demonstrates all joints distal to the ankle for range of motion without crepitation. He has some loss of joint motion at the PIPJ fifth digit right foot. Cutaneous evaluation demonstrates supple well-hydrated cutis no erythema or edema cellulitis drainage or odor. Reactive hyperkeratoses to the medial aspect of the PIPJ fifth digit right foot. This appears to be nothing more than a callus with an underlying bursitis. His nails are thick yellow dystrophic painful and clinically  mycotic.        Assessment & Plan:  Assessment: Capsulitis fifth digit right foot. Reactive hyperkeratosis medial aspect PIPJ fifth digit right foot. Pain in limb secondary to onychomycosis bilateral.  Plan: Discussed etiology pathology conservative versus surgical therapies. Injected the hip PIPJ today with dexamethasone and local and aesthetic. Also debrided the reactive hyperkeratosis in the area. We discussed appropriate shoe gear stretching exercises ice therapy padding and other conservative therapies. I debrided nails 1 through 5 bilaterally as a covered service secondary to pain and onychomycosis.

## 2015-04-05 ENCOUNTER — Other Ambulatory Visit: Payer: Self-pay | Admitting: Emergency Medicine

## 2015-04-05 ENCOUNTER — Other Ambulatory Visit: Payer: Self-pay | Admitting: Physician Assistant

## 2015-04-05 DIAGNOSIS — I10 Essential (primary) hypertension: Secondary | ICD-10-CM

## 2015-04-05 DIAGNOSIS — R609 Edema, unspecified: Secondary | ICD-10-CM

## 2015-04-14 ENCOUNTER — Ambulatory Visit (INDEPENDENT_AMBULATORY_CARE_PROVIDER_SITE_OTHER): Payer: PPO | Admitting: Physician Assistant

## 2015-04-14 ENCOUNTER — Encounter: Payer: Self-pay | Admitting: Physician Assistant

## 2015-04-14 VITALS — BP 120/78 | HR 60 | Temp 97.9°F | Resp 16 | Ht 68.0 in | Wt 229.0 lb

## 2015-04-14 DIAGNOSIS — Z7901 Long term (current) use of anticoagulants: Secondary | ICD-10-CM

## 2015-04-14 DIAGNOSIS — I4821 Permanent atrial fibrillation: Secondary | ICD-10-CM

## 2015-04-14 DIAGNOSIS — I482 Chronic atrial fibrillation: Secondary | ICD-10-CM

## 2015-04-14 MED ORDER — SPIRONOLACTONE 25 MG PO TABS: 25.0000 mg | ORAL_TABLET | Freq: Every day | ORAL | Status: DC

## 2015-04-14 NOTE — Progress Notes (Signed)
Coumadin follow up  Patient is on Coumadin for Primary Diagnosis: Permanent atrial fibrillation [I48.2] and history of mechanical heart valve.  Patient's last INR is  Lab Results  Component Value Date   INR 2.39* 03/12/2015   INR 2.90* 02/10/2015   INR 2.37* 01/08/2015    Patient denies SOB, CP, dizziness, nose bleeds, easy bleeding, and blood in stool/urine. He has had a nonproductive cough for 1 month, no orthpnea, weight is down, no PND, no edema. His coumadin dose was changed last visit, he is on 5 mg 6 days a week and 2.5mg  1 days a week. He has not taken ABX, has not missed any doses and denies a fall.   He has seen a podiatrist for right toe ulcer, being monitored by them.   Current Outpatient Prescriptions on File Prior to Visit  Medication Sig Dispense Refill  . albuterol (PROVENTIL) (2.5 MG/3ML) 0.083% nebulizer solution Take 2.5 mg by nebulization every 6 (six) hours as needed for wheezing or shortness of breath.    . allopurinol (ZYLOPRIM) 300 MG tablet Take 1 tablet (300 mg total) by mouth daily. To prevent gout 90 tablet 99  . aspirin 81 MG tablet Take 81 mg by mouth daily.    . bumetanide (BUMEX) 2 MG tablet Take 1/2 to 1 tablet 2 x day as directed for BP & swelling 180 tablet 0  . Cholecalciferol (VITAMIN D PO) Take 5,000 Units by mouth daily.     . citalopram (CELEXA) 40 MG tablet TAKE 1 TABLET BY MOUTH EVERY DAY 90 tablet 1  . Cyanocobalamin (VITAMIN B 12 PO) Take 1,500 mcg by mouth daily.     . fenofibrate (TRICOR) 145 MG tablet Take 1 tablet daily for Blood Fats 90 tablet 99  . ferrous sulfate 325 (65 FE) MG tablet Take 325 mg by mouth daily.    [name] ipratropium-albuterol (DUONEB) 0.5-2.5 (3) MG/3ML SOLN Inhale 3 mLs into the lungs every 6 (six) hours as needed.     . isosorbide mononitrate (IMDUR) 30 MG 24 hr tablet TAKE 1 TABLET BY MOUTH EVERY DAY 90 tablet 99  . levothyroxine (SYNTHROID, LEVOTHROID) 50 MCG tablet TAKE 1 TABLET BY MOUTH EVERY DAY 90 tablet 0  .  loratadine (CLARITIN) 10 MG tablet Take 10 mg by mouth daily.    . nitroGLYCERIN (NITROSTAT) 0.4 MG SL tablet Place 1 tablet (0.4 mg total) under the tongue every 5 (five) minutes as needed for chest pain. 25 tablet 5  . pravastatin (PRAVACHOL) 40 MG tablet TAKE 1 TABLET (40 MG TOTAL) BY MOUTH EVERY MORNING. 90 tablet 1  . ranitidine (ZANTAC) 300 MG tablet TAKE 1 TABLET (300 MG TOTAL) BY MOUTH AT BEDTIME. 90 tablet 1  . spironolactone (ALDACTONE) 25 MG tablet   3  . tiotropium (SPIRIVA) 18 MCG inhalation capsule Place 18 mcg into inhaler and inhale daily as needed (for shortness of breath).     . verapamil (CALAN) 80 MG tablet Take 1 tablet (80 mg total) by mouth 2 (two) times daily. 180 tablet 1  . warfarin (COUMADIN) 5 MG tablet Take 1 tablet (5 mg total) by mouth daily. Take as directed 90 tablet 3   No current facility-administered medications on file prior to visit.   Past Medical History  Diagnosis Date  . Mechanical heart valve present     a. Hx mechanical St Jude AVR 2000.  . Cataracts, bilateral   . OSA (obstructive sleep apnea)   . Permanent atrial fibrillation   . Ischemic  cardiomyopathy   . History of echocardiogram 04/17/2013    EF 40-45%; LV hypertrophy; LV mild-mod dilated; AV regurg.; LA severely diated, RA mildly dilated  . History of Doppler ultrasound 04/05/2013    LEAs; small distal abd. aoritc aneuysm is stable; fem-pop graft to R leg has 50-69% blockage  . History of nuclear stress test 05/14/2011    Dipyridamole; moderate perfusion defect in Basal Infrioer & Mid Inferior region - consistent w/infarct or scar; global LV systolic function mildly reduced; EKG negative for ischemia; low risk scan  . COPD (chronic obstructive pulmonary disease)   . Hyperlipidemia   . Hypertension   . Kidney cysts   . CKD (chronic kidney disease), stage III   . Hiatal hernia   . GERD (gastroesophageal reflux disease)   . Prediabetes   . Gout   . AAA (abdominal aortic aneurysm)   .  Vitamin B deficiency   . Peripheral arterial disease     a. s/p aorto bifem BG;  b. 11/2013 PVA distal R Fem-fem stenosis;  c. 12/2013 PTA of dist R femoral bypass graft.  [name] CAD (coronary artery disease)     a. CABG '00. b. all SVGs occluded 2010. c. low risk Nuc 2012   . Chronic anticoagulation    Allergies  Allergen Reactions  . Chantix [Varenicline] Nausea And Vomiting  . Lyrica [Pregabalin]     Swelling, couldn't breathe  . Nsaids Other (See Comments)    Unknown   . Simvastatin     Pt said he had a "bleed out" after taking it  . Ziac [Bisoprolol-Hydrochlorothiazide]     fatigue   Review of Systems  Constitutional: Negative.   HENT: Negative.   Respiratory: Positive for cough and shortness of breath (unchanged). Negative for hemoptysis, sputum production and wheezing.   Cardiovascular: Negative.   Gastrointestinal: Negative.   Genitourinary: Negative.   Musculoskeletal: Negative.   Skin: Negative.   Neurological: Negative.   Psychiatric/Behavioral: Negative.     Physical: Blood pressure 120/78, pulse 60, temperature 97.9 F (36.6 C), resp. rate 16, height 5\' 8"  (1.727 m), weight 229 lb (103.874 kg). Filed Weights   04/14/15 1145  Weight: 229 lb (103.874 kg)   General Appearance: In no apparent distress. ENT/Mouth: Nares clear with no erythema, swelling, mucus on turbinates. No ulcers, cracking, on lips. No erythema, swelling, or exudate on post pharynx.  Neck: Supple, thyroid normal.  Respiratory: Decreased breath sounds but no wheezing/rhochi/rales.    Cardio: Irregular, irregular rhythm, with systolic mechanical click without rubs or gallops. No edema  Abdomen: Soft, with bowl sounds. nontender, no guarding, rebound, hernias, masses, or organomegaly.  Skin: Warm, dry without rashes, lesions, ecchymosis.  Neuro: Unremarkable  Assessment and plan: Chronic anticoagulation- check INR and will adjust medication according to labs.  Discussed if patient falls to  immediately contact office or go to ER. Discussed foods that can increase or decrease Coumadin levels. Patient understands to call the office before starting a new medication. Follow up in one month.

## 2015-04-15 LAB — PROTIME-INR
INR: 3.32 — AB (ref <1.50)
Prothrombin Time: 33.7 seconds — ABNORMAL HIGH (ref 11.6–15.2)

## 2015-05-05 ENCOUNTER — Encounter: Payer: Self-pay | Admitting: Internal Medicine

## 2015-05-07 ENCOUNTER — Other Ambulatory Visit: Payer: Self-pay | Admitting: Internal Medicine

## 2015-05-15 ENCOUNTER — Ambulatory Visit (INDEPENDENT_AMBULATORY_CARE_PROVIDER_SITE_OTHER): Payer: PPO | Admitting: Internal Medicine

## 2015-05-15 ENCOUNTER — Encounter: Payer: Self-pay | Admitting: Internal Medicine

## 2015-05-15 VITALS — BP 130/68 | HR 64 | Temp 97.5°F | Resp 16 | Ht 67.38 in | Wt 229.6 lb

## 2015-05-15 DIAGNOSIS — Z79899 Other long term (current) drug therapy: Secondary | ICD-10-CM

## 2015-05-15 DIAGNOSIS — I4821 Permanent atrial fibrillation: Secondary | ICD-10-CM

## 2015-05-15 DIAGNOSIS — I739 Peripheral vascular disease, unspecified: Secondary | ICD-10-CM

## 2015-05-15 DIAGNOSIS — E559 Vitamin D deficiency, unspecified: Secondary | ICD-10-CM

## 2015-05-15 DIAGNOSIS — M1000 Idiopathic gout, unspecified site: Secondary | ICD-10-CM

## 2015-05-15 DIAGNOSIS — Z0001 Encounter for general adult medical examination with abnormal findings: Secondary | ICD-10-CM

## 2015-05-15 DIAGNOSIS — I10 Essential (primary) hypertension: Secondary | ICD-10-CM

## 2015-05-15 DIAGNOSIS — N183 Chronic kidney disease, stage 3 unspecified: Secondary | ICD-10-CM

## 2015-05-15 DIAGNOSIS — Z1331 Encounter for screening for depression: Secondary | ICD-10-CM

## 2015-05-15 DIAGNOSIS — Z6835 Body mass index (BMI) 35.0-35.9, adult: Secondary | ICD-10-CM

## 2015-05-15 DIAGNOSIS — J449 Chronic obstructive pulmonary disease, unspecified: Secondary | ICD-10-CM

## 2015-05-15 DIAGNOSIS — I25709 Atherosclerosis of coronary artery bypass graft(s), unspecified, with unspecified angina pectoris: Secondary | ICD-10-CM

## 2015-05-15 DIAGNOSIS — I701 Atherosclerosis of renal artery: Secondary | ICD-10-CM

## 2015-05-15 DIAGNOSIS — K219 Gastro-esophageal reflux disease without esophagitis: Secondary | ICD-10-CM

## 2015-05-15 DIAGNOSIS — G4733 Obstructive sleep apnea (adult) (pediatric): Secondary | ICD-10-CM

## 2015-05-15 DIAGNOSIS — Z125 Encounter for screening for malignant neoplasm of prostate: Secondary | ICD-10-CM

## 2015-05-15 DIAGNOSIS — E039 Hypothyroidism, unspecified: Secondary | ICD-10-CM

## 2015-05-15 DIAGNOSIS — E1122 Type 2 diabetes mellitus with diabetic chronic kidney disease: Secondary | ICD-10-CM

## 2015-05-15 DIAGNOSIS — R6889 Other general symptoms and signs: Secondary | ICD-10-CM

## 2015-05-15 DIAGNOSIS — E782 Mixed hyperlipidemia: Secondary | ICD-10-CM

## 2015-05-15 DIAGNOSIS — Z7901 Long term (current) use of anticoagulants: Secondary | ICD-10-CM

## 2015-05-15 DIAGNOSIS — E539 Vitamin B deficiency, unspecified: Secondary | ICD-10-CM

## 2015-05-15 DIAGNOSIS — F17200 Nicotine dependence, unspecified, uncomplicated: Secondary | ICD-10-CM | POA: Insufficient documentation

## 2015-05-15 DIAGNOSIS — Z1212 Encounter for screening for malignant neoplasm of rectum: Secondary | ICD-10-CM

## 2015-05-15 NOTE — Progress Notes (Signed)
Patient ID: [name], male   DOB: 07-Jul-1941, 74 y.o.   MRN: [id number]  [facility] VISIT AND CPE  Assessment:   1. Essential hypertension  - EKG 12-Lead - TSH  2. Hyperlipidemia  - Lipid panel  3. Type 2 diabetes mellitus with chronic kidney disease  - Microalbumin / creatinine urine ratio - HM DIABETES FOOT EXAM - LOW EXTREMITY NEUR EXAM DOCUM - Hemoglobin A1c - Insulin, random  4. Vitamin D deficiency  - Vit D  25 hydroxy (rtn osteoporosis monitoring)  5. Vitamin B deficiency  - Vitamin B12  6. 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 Permanent atrial fibrillation   8. Peripheral arterial disease   9. PAD,s/p aorto bifem BG, s/p Rt Fem PTA 3/15   10. CKD (chronic kidney disease) stage 3, GFR 30-59 ml/min   11. Renal artery stenosis   12. Chronic obstructive pulmonary disease    13. OSA (obstructive sleep apnea)   14. Hypothyroidism   15. Idiopathic gout  - Uric acid  16. Gastroesophageal reflux disease   17. Tobacco use disorder   18. Screening for rectal cancer  - POC Hemoccult Bld/Stl   19. Prostate cancer screening  - PSA  20. Long term current use of anticoagulant therapy  - Protime-INR  21. Encounter for long-term (current) use of medications  - Urine Microscopic - Hepatic function panel - Magnesium  22. Encounter for general adult medical examination with abnormal findings  - Urine Microscopic - Microalbumin / creatinine urine ratio - EKG 12-Lead - POC Hemoccult Bld/Stl (3-Cd Home Screen); Future - Vitamin B12 - PSA - HM DIABETES FOOT EXAM - LOW EXTREMITY NEUR EXAM DOCUM - Hepatic function panel - Magnesium - Lipid panel - TSH - Hemoglobin A1c - Insulin, random - Vit D  25 hydroxy (rtn osteoporosis monitoring) - Uric acid - Protime-INR  23. Morbid obesity  (BMI 35.57)   24. BMI  35.0-35.9,adult   25. Depression screen  Plan:   During the course of the visit the patient was educated and counseled about appropriate screening and preventive services including:    Pneumococcal vaccine   Influenza vaccine  Td vaccine  Screening electrocardiogram  Bone densitometry screening  Colorectal cancer screening  Diabetes screening  Glaucoma screening  Nutrition counseling   Advanced directives: requested  Screening recommendations, referrals: Vaccinations: Immunization History  Administered Date(s) Administered  . DT 01/25/2012  . Influenza Split 06/28/2013  . Influenza, High Dose Seasonal PF 08/02/2014  . Pneumococcal Conjugate-13 03/25/2014  . Pneumococcal Polysaccharide-23 08/11/2012  . Zoster 10/30/2010  Hep B vaccine not indicated  Nutrition assessed and recommended  Colonoscopy 08/09/2007 Recommended yearly ophthalmology/optometry visit for glaucoma screening and checkup Recommended yearly dental visit for hygiene and checkup Advanced directives - yes  Conditions/risks identified: BMI: Discussed weight loss, diet, and increase physical activity.  Increase physical activity: AHA recommends 150 minutes of physical activity a week.  Medications reviewed Diabetes is not at goal, ACE/ARB therapy: No Urinary Incontinence is not an issue: discussed non pharmacology and pharmacology options.  Fall risk: low- discussed PT, home fall assessment, medications.   Subjective:    Robert Stanton  presents for [facility] Annual Wellness Visit and complete physical.  Date of last medicare wellness visit was 03/25/2014. This very nice 74 y.o. MWM presents also for follow up and comprehensive exam for hx/o  Hypertension, Hyperlipidemia, Pre-Diabetes and Vitamin D Deficiency. Patient has moderately severe COPD  and continues to smoke and is recalcitrant to quitting smoking.  Patient is on CPAP for OSA and reports improved sense of well-being on CPAP. Patient is  followed by Dr Posey Pronto for CKD 3 with GFR 34 ml/min. Patient's GERD is controlled on Ranitidine. He also has Gout controlled with his meds.    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 BP has been controlled at home. Today's BP: 130/68 mmHg. Patient also has ASCVD with hx/o remote TIA and also ASPVD s/p AortoBifem BPG, Rt FemPop PTA, AAA, and Known Renal Art Stenosis.  Patient has had no complaints of any cardiac type chest pain, palpitations, dyspnea/orthopnea/PND, dizziness, claudication, or dependent edema.   Hyperlipidemia is not controlled with non-compliant diet & meds. Patient denies myalgias or other med SE's. Last Lipids were not at goal - Cholesterol 177; HDL 35*; LDL 111*; Triglycerides 155 on 02/10/2015.   Also, the patient has history of Morbid Obesity (BMI 35.57) and consequent T2_NIDDM and is attempting to control by diet . He has had no symptoms of reactive hypoglycemia, diabetic polys, paresthesias or visual blurring.  Last A1c was  6.2% on 02/10/2015.   Further, the patient also has history of Vitamin D Deficiency of 22 in 2009 and supplements vitamin D without any suspected side-effects. Last vitamin D was 68 on 02/10/2015.    Names of Other Physician/Practitioners you currently use: 1. [facility] here for primary care 2. Dr Renaldo Fiddler - [place], eye doctor, last visit 2015 and recently referred to Dr Talbert Forest for Cataract extractions. 3. No dentist- has full dentures  Patient Care Team: Unk Pinto, MD as PCP - General (Internal Medicine) Pixie Casino, MD as Consulting Physician (Cardiology) Inda Castle, MD as Consulting Physician (Gastroenterology) Lorretta Harp, MD as Consulting Physician (Cardiology) Elmarie Shiley, MD as Consulting Physician (Nephrology) Rana Snare, MD as Consulting Physician  (Urology)  Medication Review: Medication Sig  . Albuterol (2.5 MG/3ML) 0.083% ne soln Take 2.5 mg by nebulization every 6 (six) hours as needed   . allopurinol  300 MG tablet TAKE 1 TABLET (300 MG TOTAL) BY MOUTH DAILY. TO PREVENT GOUT  . aspirin 81 MG tablet Take 81 mg by mouth daily.  . bumetanide  2 MG tablet Take 1/2 to 1 tablet 2 x day as directed for BP & swelling  . VITAMIN D  Take 5,000 Units by mouth daily.   . citalopram 40 MG tablet TAKE 1 TABLET BY MOUTH EVERY DAY  . VITAMIN B 12  Take 1,500 mcg by mouth daily.   . fenofibrate  145 MG tablet Take 1 tablet daily for Blood Fats  . ferrous sulfate 325 (65 FE) MG tablet Take 325 mg by mouth daily.  . DUONEB 0.5-2.5 (3) MG/3ML SOLN Inhale 3 mLs into the lungs every 6 (six) hours as needed.   . isosorbide mononitrate  30 MG  TAKE 1 TABLET BY MOUTH EVERY DAY  . levothyroxine  50 MCG tablet TAKE 1 TABLET BY MOUTH EVERY DAY  . loratadine (10 MG tablet Take 10 mg by mouth daily.  [name] NITROSTAT 0.4 MG SL tablet Place 1 tab under tongue every 5  minutes as needed.  . pravastatin  40 MG tablet TAKE 1 TABLET (40 MG TOTAL) BY MOUTH EVERY MORNING.  . ranitidine (ZANTAC) 300 MG tablet  TAKE 1 TABLET (300 MG TOTAL) BY MOUTH AT BEDTIME.  [name] spironolactone  25 MG tablet Take 1 tablet (25 mg total) by mouth daily.  [name] tiotropium (SPIRIVA)  inhalation  Place 18 mcg into inhaler and inhale daily as needed  . verapamil (CALAN) 80 MG tablet Take 1 tablet (80 mg total) by mouth 2 (two) times daily.  [name] warfarin (COUMADIN) 5 MG tablet Take 1 tablet (5 mg total) by mouth daily. Take as directed   Allergies  Allergen Reactions  . Chantix [Varenicline] Nausea And Vomiting  . Lyrica [Pregabalin]     Swelling, couldn't breathe  . Nsaids Other (See Comments)    Unknown   . Simvastatin     Pt said he had a "bleed out" after taking it  . Ziac [Bisoprolol-Hydrochlorothiazide]     fatigue   Current Problems (verified) Patient Active Problem List   Diagnosis  Date Noted  . Tobacco use disorder 05/15/2015  . Morbid obesity  (BMI 35.57) 05/15/2015  . Poor compliance 02/10/2015  . Hypothyroidism 10/07/2014  . Generalized anxiety disorder 10/03/2014  . Status post mechanical aortic valve replacement 09/02/2014  . Encounter for long-term (current) use of medications 02/21/2014  . Vitamin D deficiency 02/21/2014  . Long term current use of anticoagulant therapy 01/16/2014  . Permanent atrial fibrillation 12/04/2013  . CKD (chronic kidney disease) stage 3, GFR 30-59 ml/min 12/04/2013  . Peripheral arterial disease 12/02/2013  . COPD  CT done 12/20/13 ok   . Kidney cysts   . GERD (gastroesophageal reflux disease)   . Type 2 diabetes mellitus with chronic kidney disease   . Gout   . OSA (obstructive sleep apnea)   . AAA (abdominal aortic aneurysm)   . Vitamin B deficiency   . Renal artery stenosis 05/01/2013  . PAD,s/p aorto bifem BG, s/p Rt Fem PTA 3/15 04/06/2013  . COLONIC POLYPS 12/26/2007  . Hyperlipidemia 12/26/2007  . Essential hypertension 12/26/2007  . Coronary atherosclerosis 12/26/2007  . ESOPHAGEAL STRICTURE 12/26/2007  . SLEEP APNEA 12/26/2007   Screening Tests Health Maintenance  Topic Date Due  . FOOT EXAM  12/25/1952  . OPHTHALMOLOGY EXAM  12/25/1952  . TETANUS/TDAP  12/25/1961  . URINE MICROALBUMIN  04/26/2015  . INFLUENZA VACCINE  05/19/2015  . HEMOGLOBIN A1C  08/12/2015  . COLONOSCOPY  08/08/2017  . ZOSTAVAX  Completed  . PNA vac Low Risk Adult  Completed   Immunization History  Administered Date(s) Administered  . DT 01/25/2012  . Influenza Split 06/28/2013  . Influenza, High Dose Seasonal PF 08/02/2014  . Pneumococcal Conjugate-13 03/25/2014  . Pneumococcal Polysaccharide-23 08/11/2012  . Zoster 10/30/2010   Preventative care: Last colonoscopy: 08/09/2007  Past Medical History  Diagnosis Date  . Mechanical heart valve present     a. Hx mechanical St Jude AVR 2000.  . Cataracts, bilateral   . OSA  (obstructive sleep apnea)   . Permanent atrial fibrillation   . Ischemic cardiomyopathy   . History of echocardiogram 04/17/2013    EF 40-45%; LV hypertrophy; LV mild-mod dilated; AV regurg.; LA severely diated, RA mildly dilated  . History of Doppler ultrasound 04/05/2013    LEAs; small distal abd. aoritc aneuysm is stable; fem-pop graft to R leg has 50-69% blockage  . History of nuclear stress test 05/14/2011    Dipyridamole; moderate perfusion defect in Basal Infrioer & Mid Inferior region - consistent w/infarct or scar; global LV systolic function mildly reduced; EKG negative for ischemia; low risk scan  . COPD (chronic  obstructive pulmonary disease)   . Hyperlipidemia   . Hypertension   . Kidney cysts   . CKD (chronic kidney disease), stage III   . Hiatal hernia   . GERD (gastroesophageal reflux disease)   . Prediabetes   . Gout   . AAA (abdominal aortic aneurysm)   . Vitamin B deficiency   . Peripheral arterial disease     a. s/p aorto bifem BG;  b. 11/2013 PVA distal R Fem-fem stenosis;  c. 12/2013 PTA of dist R femoral bypass graft.  [name] CAD (coronary artery disease)     a. CABG '00. b. all SVGs occluded 2010. c. low risk Nuc 2012   . Chronic anticoagulation    Past Surgical History  Procedure Laterality Date  . Stents in kidneys    . Coronary artery bypass graft  2000    3 vessel; LIMA to LAD; RCA, OM  . Shunts in femoral arteries    . Cardioversion  8/282012  . Aortic valve replacement      St. Jude  . Femoral bypass    . Cardiac catheterization  11/25/2008    loss of 2/3 (RCA, OM) bypass grafts with patent internal mammary artery to LAD; large collateral filling of RCA ; osteal narrowing of circumflex of ~50%  . Lower extremity angiogram N/A 12/03/2013    Procedure: LOWER EXTREMITY ANGIOGRAM;  Surgeon: Lorretta Harp, MD;  Location: [facility] CATH LAB;  Service: Cardiovascular;  Laterality: N/A;  . Lower extremity angiogram N/A 01/07/2014    Procedure: LOWER EXTREMITY ANGIOGRAM;   Surgeon: Lorretta Harp, MD;  Location: [facility] CATH LAB;  Service: Cardiovascular;  Laterality: N/A;   Risk Factors: Tobacco History  Substance Use Topics  . Smoking status: Current Every Day Smoker -- 0.05 packs/day for 40 years    Types: Cigarettes  . Smokeless tobacco: Never Used  . Alcohol Use: No   He does smoke.  Patient is counseled in smoking cessation and indicates that he has no intention of attempting to stop smoking.  Are there smokers in your home (other than you)?  Yes, wife has severe End Stage COPD - on oxygen and continues to smoke heavily also.   Alcohol Current alcohol use: none  Caffeine Current caffeine use: coffee 5 cups /day  Exercise Current exercise: walking  Nutrition/Diet Current diet: in general, an "unhealthy" diet  Cardiac risk factors: advanced age (older than 46 for men, 40 for women), diabetes mellitus, dyslipidemia, hypertension, male gender, obesity (BMI >= 30 kg/m2), sedentary lifestyle and smoking/ tobacco exposure.  Depression Screen (Note: if answer to either of the following is "Yes", a more complete depression screening is indicated)   Q1: Over the past two weeks, have you felt down, depressed or hopeless? No  Q2: Over the past two weeks, have you felt little interest or pleasure in doing things? No  Have you lost interest or pleasure in daily life? No  Do you often feel hopeless? No  Do you cry easily over simple problems? No  Activities of Daily Living In your present state of health, do you have any difficulty performing the following activities?:  Driving? No Managing money?  No Feeding yourself? No Getting from bed to chair? No Climbing a flight of stairs? No Preparing food and eating?: No Bathing or showering? No Getting dressed: No Getting to the toilet? No Using the toilet:No Moving around from place to place: No In the past year have you fallen or had a near fall?:No   Are  you sexually active?  No  Do you have more  than one partner?  No  Vision Difficulties: No  Hearing Difficulties: No Do you often ask people to speak up or repeat themselves? No Do you experience ringing or noises in your ears? No Do you have difficulty understanding soft or whispered voices? No  Cognition  Do you feel that you have a problem with memory?No  Do you often misplace items? No  Do you feel safe at home?  Yes  Advanced directives Does patient have a [place]? No Does patient have a Living Will? Yes  ROS: Constitutional: Denies fever, chills, weight loss/gain, headaches, insomnia, fatigue, night sweats or change in appetite. Eyes: Denies redness, blurred vision, diplopia, discharge, itchy or watery eyes.  ENT: Denies discharge, congestion, post nasal drip, epistaxis, sore throat, earache, hearing loss, dental pain, Tinnitus, Vertigo, Sinus pain or snoring.  Cardio: Denies chest pain, palpitations, irregular heartbeat, syncope, dyspnea, diaphoresis, orthopnea, PND, claudication or edema Respiratory: denies cough, dyspnea, DOE, pleurisy, hoarseness, laryngitis or wheezing.  Gastrointestinal: Denies dysphagia, heartburn, reflux, water brash, pain, cramps, nausea, vomiting, bloating, diarrhea, constipation, hematemesis, melena, hematochezia, jaundice or hemorrhoids Genitourinary: Denies dysuria, frequency, urgency, nocturia, hesitancy, discharge, hematuria or flank pain Musculoskeletal: Denies arthralgia, myalgia, stiffness, Jt. Swelling, pain, limp or strain/sprain. Denies Falls. Skin: Denies puritis, rash, hives, warts, acne, eczema or change in skin lesion Neuro: No weakness, tremor, incoordination, spasms, paresthesia or pain Psychiatric: Denies confusion, memory loss or sensory loss. Denies Depression. Endocrine: Denies change in weight, skin, hair change, nocturia, and paresthesia, diabetic polys, visual blurring or hyper / hypo glycemic episodes.  Heme/Lymph: No excessive bleeding, bruising or  enlarged lymph nodes.  Objective:     BP 130/68   Pulse 64  Temp 97.5 F  Resp 16  Ht 5' 7.37"   Wt 229 lb 9.6 oz   BMI 35.57   General Appearance:  Alert  WD/WN, male  in no apparent distress. Eyes: PERRLA, EOMs nl, conjunctiva normal, normal fundi and vessels. Sinuses: No frontal/maxillary tenderness ENT/Mouth: EACs patent / TMs  nl. Nares clear without erythema, swelling, mucoid exudates. Oral hygiene is good. No erythema, swelling, or exudate. Tongue normal, non-obstructing. Tonsils not swollen or erythematous. Hearing normal.  Neck: Supple, thyroid normal. No bruits, nodes or JVD. Respiratory: Healed median Sternotomy scar. Barrel chested. Respiratory effort normal.  BS decreased bilateral with few scattered  Rales, but no  rhonci, wheezing or stridor. Cardio: Heart sounds are normal with regular rate and rhythm and no murmurs, rubs or gallops. Peripheral pulses are normal and equal bilaterally without edema. No aortic or femoral bruits. Chest: symmetric with normal excursions and percussion.  Abdomen: Flat, soft, with nl bowel sounds. Nontender, no guarding, rebound, hernias, masses, or organomegaly.  Lymphatics: Non tender without lymphadenopathy.  Genitourinary: No hernias.Testes nl. DRE - prostate nl for age - smooth & firm w/o nodules. Musculoskeletal: Full ROM all peripheral extremities, joint stability, 5/5 strength, and normal gait. Skin: Warm and dry without rashes, lesions, cyanosis, clubbing or  ecchymosis.  Neuro: Cranial nerves intact, reflexes equal bilaterally. Normal muscle tone, no cerebellar symptoms. Sensation intact by monofilament testing to the toes bilateral.  Pysch: Alert and oriented X 3 with normal affect, insight and judgment appropriate.   Cognitive Testing  Alert? Yes  Normal Appearance? Yes  Oriented to person? Yes  Place? Yes   Time? Yes  Recall of three objects?  Yes  Can perform simple calculations? Yes  Displays appropriate judgment?  Yes  Can  read the correct time from a watch/clock? Yes  Medicare Attestation I have personally reviewed: The patient's medical and social history Their use of alcohol, tobacco or illicit drugs Their current medications and supplements The patient's functional ability including ADLs,fall risks, home safety risks, cognitive, and hearing and visual impairment Diet and physical activities Evidence for depression or mood disorders  The patient's weight, height, BMI, and visual acuity have been recorded in the chart.  I have made referrals, counseling, and provided education to the patient based on review of the above and I have provided the patient with a written personalized care plan for preventive services.  Over 40 minutes of exam, counseling, chart review was performed.  Yianna Tersigni DAVID, MD   05/15/2015

## 2015-05-15 NOTE — Patient Instructions (Signed)

## 2015-05-16 LAB — HEMOGLOBIN A1C
HEMOGLOBIN A1C: 5.7 % — AB (ref <5.7)
Mean Plasma Glucose: 117 mg/dL — ABNORMAL HIGH (ref <117)

## 2015-05-16 LAB — BASIC METABOLIC PANEL WITH GFR
BUN: 29 mg/dL — AB (ref 7–25)
CO2: 24 mmol/L (ref 20–31)
CREATININE: 2.07 mg/dL — AB (ref 0.70–1.18)
Calcium: 9.4 mg/dL (ref 8.6–10.3)
Chloride: 101 mmol/L (ref 98–110)
GFR, EST NON AFRICAN AMERICAN: 31 mL/min — AB (ref >=60)
GFR, Est African American: 36 mL/min — ABNORMAL LOW (ref >=60)
GLUCOSE: 126 mg/dL — AB (ref 65–99)
Potassium: 4.7 mmol/L (ref 3.5–5.3)
Sodium: 137 mmol/L (ref 135–146)

## 2015-05-16 LAB — INSULIN, RANDOM: INSULIN: 34.7 u[IU]/mL — AB (ref 2.0–19.6)

## 2015-05-16 LAB — PSA: PSA: 1.16 ng/mL (ref <=4.00)

## 2015-05-16 LAB — HEPATIC FUNCTION PANEL
ALBUMIN: 4.1 g/dL (ref 3.6–5.1)
ALT: 9 U/L (ref 9–46)
AST: 18 U/L (ref 10–35)
Alkaline Phosphatase: 53 U/L (ref 40–115)
Bilirubin, Direct: 0.1 mg/dL (ref <=0.2)
Indirect Bilirubin: 0.3 mg/dL (ref 0.2–1.2)
TOTAL PROTEIN: 7.4 g/dL (ref 6.1–8.1)
Total Bilirubin: 0.4 mg/dL (ref 0.2–1.2)

## 2015-05-16 LAB — TSH: TSH: 2.524 u[IU]/mL (ref 0.350–4.500)

## 2015-05-16 LAB — LIPID PANEL
CHOL/HDL RATIO: 5.6 ratio — AB (ref <=5.0)
Cholesterol: 202 mg/dL — ABNORMAL HIGH (ref 125–200)
HDL: 36 mg/dL — ABNORMAL LOW (ref >=40)
LDL Cholesterol: 116 mg/dL (ref <130)
TRIGLYCERIDES: 249 mg/dL — AB (ref <150)
VLDL: 50 mg/dL — ABNORMAL HIGH (ref <30)

## 2015-05-16 LAB — CBC WITH DIFFERENTIAL/PLATELET
BASOS PCT: 1 % (ref 0–1)
Basophils Absolute: 0.1 10*3/uL (ref 0.0–0.1)
EOS PCT: 5 % (ref 0–5)
Eosinophils Absolute: 0.3 10*3/uL (ref 0.0–0.7)
HCT: 37.8 % — ABNORMAL LOW (ref 39.0–52.0)
Hemoglobin: 12.3 g/dL — ABNORMAL LOW (ref 13.0–17.0)
Lymphocytes Relative: 26 % (ref 12–46)
Lymphs Abs: 1.7 10*3/uL (ref 0.7–4.0)
MCH: 30.9 pg (ref 26.0–34.0)
MCHC: 32.5 g/dL (ref 30.0–36.0)
MCV: 95 fL (ref 78.0–100.0)
MONO ABS: 0.6 10*3/uL (ref 0.1–1.0)
MONOS PCT: 9 % (ref 3–12)
MPV: 9.8 fL (ref 8.6–12.4)
NEUTROS ABS: 3.8 10*3/uL (ref 1.7–7.7)
Neutrophils Relative %: 59 % (ref 43–77)
Platelets: 219 10*3/uL (ref 150–400)
RBC: 3.98 MIL/uL — ABNORMAL LOW (ref 4.22–5.81)
RDW: 14.9 % (ref 11.5–15.5)
WBC: 6.4 10*3/uL (ref 4.0–10.5)

## 2015-05-16 LAB — MAGNESIUM: MAGNESIUM: 2 mg/dL (ref 1.5–2.5)

## 2015-05-16 LAB — URINALYSIS, MICROSCOPIC ONLY
Bacteria, UA: NONE SEEN [HPF]
Casts: NONE SEEN [LPF]
Crystals: NONE SEEN [HPF]
RBC / HPF: NONE SEEN RBC/HPF (ref <=2)
SQUAMOUS EPITHELIAL / LPF: NONE SEEN [HPF] (ref <=5)
WBC UA: NONE SEEN WBC/HPF (ref <=5)
YEAST: NONE SEEN [HPF]

## 2015-05-16 LAB — VITAMIN B12: VITAMIN B 12: 464 pg/mL (ref 211–911)

## 2015-05-16 LAB — MICROALBUMIN / CREATININE URINE RATIO
CREATININE, URINE: 94.1 mg/dL
MICROALB/CREAT RATIO: 80.8 mg/g — AB (ref 0.0–30.0)
Microalb, Ur: 7.6 mg/dL — ABNORMAL HIGH (ref <2.0)

## 2015-05-16 LAB — URIC ACID: Uric Acid, Serum: 7 mg/dL (ref 4.0–7.8)

## 2015-05-16 LAB — VITAMIN D 25 HYDROXY (VIT D DEFICIENCY, FRACTURES): Vit D, 25-Hydroxy: 43 ng/mL (ref 30–100)

## 2015-05-16 LAB — PROTIME-INR
INR: 2.24 — AB (ref <1.50)
PROTHROMBIN TIME: 24.8 s — AB (ref 11.6–15.2)

## 2015-06-08 ENCOUNTER — Emergency Department (HOSPITAL_COMMUNITY): Payer: PPO

## 2015-06-08 ENCOUNTER — Inpatient Hospital Stay (HOSPITAL_COMMUNITY)
Admission: EM | Admit: 2015-06-08 | Discharge: 2015-06-11 | DRG: 309 | Disposition: A | Discharge status: Home / Self Care | Payer: PPO | Attending: Internal Medicine | Admitting: Internal Medicine

## 2015-06-08 ENCOUNTER — Encounter (HOSPITAL_COMMUNITY): Payer: Self-pay | Admitting: Emergency Medicine

## 2015-06-08 DIAGNOSIS — I482 Chronic atrial fibrillation: Secondary | ICD-10-CM | POA: Diagnosis present

## 2015-06-08 DIAGNOSIS — N183 Chronic kidney disease, stage 3 (moderate): Secondary | ICD-10-CM

## 2015-06-08 DIAGNOSIS — N184 Chronic kidney disease, stage 4 (severe): Secondary | ICD-10-CM | POA: Diagnosis present

## 2015-06-08 DIAGNOSIS — I129 Hypertensive chronic kidney disease with stage 1 through stage 4 chronic kidney disease, or unspecified chronic kidney disease: Secondary | ICD-10-CM | POA: Diagnosis present

## 2015-06-08 DIAGNOSIS — E785 Hyperlipidemia, unspecified: Secondary | ICD-10-CM | POA: Diagnosis present

## 2015-06-08 DIAGNOSIS — I714 Abdominal aortic aneurysm, without rupture: Secondary | ICD-10-CM | POA: Diagnosis present

## 2015-06-08 DIAGNOSIS — J449 Chronic obstructive pulmonary disease, unspecified: Secondary | ICD-10-CM | POA: Diagnosis present

## 2015-06-08 DIAGNOSIS — I25810 Atherosclerosis of coronary artery bypass graft(s) without angina pectoris: Secondary | ICD-10-CM | POA: Diagnosis present

## 2015-06-08 DIAGNOSIS — K219 Gastro-esophageal reflux disease without esophagitis: Secondary | ICD-10-CM | POA: Diagnosis present

## 2015-06-08 DIAGNOSIS — E539 Vitamin B deficiency, unspecified: Secondary | ICD-10-CM | POA: Diagnosis present

## 2015-06-08 DIAGNOSIS — I4891 Unspecified atrial fibrillation: Secondary | ICD-10-CM | POA: Diagnosis not present

## 2015-06-08 DIAGNOSIS — I472 Ventricular tachycardia, unspecified: Secondary | ICD-10-CM

## 2015-06-08 DIAGNOSIS — H269 Unspecified cataract: Secondary | ICD-10-CM | POA: Diagnosis present

## 2015-06-08 DIAGNOSIS — Z888 Allergy status to other drugs, medicaments and biological substances status: Secondary | ICD-10-CM

## 2015-06-08 DIAGNOSIS — I10 Essential (primary) hypertension: Secondary | ICD-10-CM

## 2015-06-08 DIAGNOSIS — G4733 Obstructive sleep apnea (adult) (pediatric): Secondary | ICD-10-CM | POA: Diagnosis present

## 2015-06-08 DIAGNOSIS — Z79899 Other long term (current) drug therapy: Secondary | ICD-10-CM

## 2015-06-08 DIAGNOSIS — Z7982 Long term (current) use of aspirin: Secondary | ICD-10-CM

## 2015-06-08 DIAGNOSIS — R7309 Other abnormal glucose: Secondary | ICD-10-CM | POA: Diagnosis present

## 2015-06-08 DIAGNOSIS — K449 Diaphragmatic hernia without obstruction or gangrene: Secondary | ICD-10-CM | POA: Diagnosis present

## 2015-06-08 DIAGNOSIS — M109 Gout, unspecified: Secondary | ICD-10-CM | POA: Diagnosis present

## 2015-06-08 DIAGNOSIS — Z951 Presence of aortocoronary bypass graft: Secondary | ICD-10-CM | POA: Diagnosis not present

## 2015-06-08 DIAGNOSIS — F17210 Nicotine dependence, cigarettes, uncomplicated: Secondary | ICD-10-CM | POA: Diagnosis present

## 2015-06-08 DIAGNOSIS — Z952 Presence of prosthetic heart valve: Secondary | ICD-10-CM | POA: Diagnosis not present

## 2015-06-08 DIAGNOSIS — R072 Precordial pain: Secondary | ICD-10-CM | POA: Diagnosis not present

## 2015-06-08 DIAGNOSIS — Z95820 Peripheral vascular angioplasty status with implants and grafts: Secondary | ICD-10-CM

## 2015-06-08 DIAGNOSIS — Z8249 Family history of ischemic heart disease and other diseases of the circulatory system: Secondary | ICD-10-CM

## 2015-06-08 DIAGNOSIS — I209 Angina pectoris, unspecified: Secondary | ICD-10-CM | POA: Diagnosis not present

## 2015-06-08 DIAGNOSIS — Z7901 Long term (current) use of anticoagulants: Secondary | ICD-10-CM | POA: Diagnosis not present

## 2015-06-08 DIAGNOSIS — R609 Edema, unspecified: Secondary | ICD-10-CM

## 2015-06-08 DIAGNOSIS — I255 Ischemic cardiomyopathy: Secondary | ICD-10-CM | POA: Diagnosis present

## 2015-06-08 DIAGNOSIS — I248 Other forms of acute ischemic heart disease: Secondary | ICD-10-CM | POA: Diagnosis present

## 2015-06-08 DIAGNOSIS — I739 Peripheral vascular disease, unspecified: Secondary | ICD-10-CM | POA: Diagnosis present

## 2015-06-08 DIAGNOSIS — I4729 Other ventricular tachycardia: Secondary | ICD-10-CM

## 2015-06-08 HISTORY — DX: Ventricular tachycardia: I47.2

## 2015-06-08 HISTORY — DX: Other ventricular tachycardia: I47.29

## 2015-06-08 HISTORY — DX: Ventricular tachycardia, unspecified: I47.20

## 2015-06-08 LAB — CBC
HEMATOCRIT: 36.8 % — AB (ref 39.0–52.0)
HEMOGLOBIN: 12.3 g/dL — AB (ref 13.0–17.0)
MCH: 31.1 pg (ref 26.0–34.0)
MCHC: 33.4 g/dL (ref 30.0–36.0)
MCV: 92.9 fL (ref 78.0–100.0)
Platelets: 219 10*3/uL (ref 150–400)
RBC: 3.96 MIL/uL — AB (ref 4.22–5.81)
RDW: 14.1 % (ref 11.5–15.5)
WBC: 6 10*3/uL (ref 4.0–10.5)

## 2015-06-08 LAB — BASIC METABOLIC PANEL
ANION GAP: 9 (ref 5–15)
BUN: 26 mg/dL — ABNORMAL HIGH (ref 6–20)
CALCIUM: 9.2 mg/dL (ref 8.9–10.3)
CO2: 25 mmol/L (ref 22–32)
Chloride: 98 mmol/L — ABNORMAL LOW (ref 101–111)
Creatinine, Ser: 2.05 mg/dL — ABNORMAL HIGH (ref 0.61–1.24)
GFR calc non Af Amer: 31 mL/min — ABNORMAL LOW (ref >60)
GFR, EST AFRICAN AMERICAN: 36 mL/min — AB (ref >60)
GLUCOSE: 118 mg/dL — AB (ref 65–99)
POTASSIUM: 4.3 mmol/L (ref 3.5–5.1)
Sodium: 132 mmol/L — ABNORMAL LOW (ref 135–145)

## 2015-06-08 LAB — I-STAT TROPONIN, ED: Troponin i, poc: 0.02 ng/mL (ref 0.00–0.08)

## 2015-06-08 LAB — PROTIME-INR
INR: 2.02 — AB (ref 0.00–1.49)
Prothrombin Time: 22.7 seconds — ABNORMAL HIGH (ref 11.6–15.2)

## 2015-06-08 MED ORDER — AMIODARONE IV BOLUS ONLY 150 MG/100ML
150.0000 mg | Freq: Once | INTRAVENOUS | Status: AC
  Administered 2015-06-08: 150 mg via INTRAVENOUS
  Filled 2015-06-08: qty 100

## 2015-06-08 MED ORDER — AMIODARONE HCL IN DEXTROSE 360-4.14 MG/200ML-% IV SOLN
30.0000 mg/h | INTRAVENOUS | Status: DC
  Administered 2015-06-09: 30 mg/h via INTRAVENOUS
  Filled 2015-06-08 (×3): qty 200

## 2015-06-08 MED ORDER — AMIODARONE HCL IN DEXTROSE 360-4.14 MG/200ML-% IV SOLN
60.0000 mg/h | INTRAVENOUS | Status: DC
  Administered 2015-06-09: 60 mg/h via INTRAVENOUS
  Filled 2015-06-08 (×2): qty 200

## 2015-06-08 NOTE — H&P (Signed)
Admit date: 06/08/2015 Referring Physician:  Dr. Sabra Heck Primary Cardiologist: Dr. [name] Chief complaint/reason for admission:  Wide complex tachycardia  HPI: 74 year old male with a prior history of CKD stage III, PAD, CAD (s/p CABG in 2000, all VG occluded by cath 2010, low risk nuc 2012), s/p mechanical St. Jude AVR in 2000, LV dysfunction EF 40-45% in 2014, OSA, COPD, permanent AF and h/o flutter (s/p DCCV in 2012 but now chronic AF).  He also has peripheral arterial disease status post right to left femoral to femoral crossover grafting as well as bilateral femoral-popliteal grafting. He recently underwent arterial brachial indices testing showing an ABI of 0.64 on the right. Peripheral angiography was performed on 12/03/2013 revealing distal right femoral to femoral graft stenosis and underwent successful percutaneous transluminal angioplasty of the distal stenosis in the femoral to femoral bypass graft with reduction in stenosis from 90% to less than 30%.   He has done well since last OV over a year ago and his wife says he is compliant with his medications.  He apparently had been on Amio in the past but says it was stopped ? Due to CKD.  He has a history of chronic atrial fibrillation and he states it has never been fast but today his heart rate was up around 150 bpm and he could feel it going fast, associated with chest pain, for this reason he called 911. The EKG transmitted by the paramedics showed a wide complex regular tachycardia consistent with ventricular tachycardia or SVT with wide QRS. Prior EKG shows QRS with a different complex though it was slightly widened. Before the patient arrived to the hospital and without medications he converted into atrial fibrillation which is his baseline rhythm. He was loaded with amiodarone and Cardiology is now asked to evaluate for further treatment.  The patient denies any chest pain or SOB other than during his palpitations.  He denies any LE edema,  dizziness.  He does admit to intermittent palpitations but never lasting this long.     PMH:    Past Medical History  Diagnosis Date  . Mechanical heart valve present     a. Hx mechanical St Jude AVR 2000.  . Cataracts, bilateral   . OSA (obstructive sleep apnea)   . Permanent atrial fibrillation   . Ischemic cardiomyopathy   . History of echocardiogram 04/17/2013    EF 40-45%; LV hypertrophy; LV mild-mod dilated; AV regurg.; LA severely diated, RA mildly dilated  . History of Doppler ultrasound 04/05/2013    LEAs; small distal abd. aoritc aneuysm is stable; fem-pop graft to R leg has 50-69% blockage  . History of nuclear stress test 05/14/2011    Dipyridamole; moderate perfusion defect in Basal Infrioer & Mid Inferior region - consistent w/infarct or scar; global LV systolic function mildly reduced; EKG negative for ischemia; low risk scan  . COPD (chronic obstructive pulmonary disease)   . Hyperlipidemia   . Hypertension   . Kidney cysts   . CKD (chronic kidney disease), stage III   . Hiatal hernia   . GERD (gastroesophageal reflux disease)   . Prediabetes   . Gout   . AAA (abdominal aortic aneurysm)   . Vitamin B deficiency   . Peripheral arterial disease     a. s/p aorto bifem BG;  b. 11/2013 PVA distal R Fem-fem stenosis;  c. 12/2013 PTA of dist R femoral bypass graft.  [name] CAD (coronary artery disease)     a. CABG '00. b. all SVGs  occluded 2010. c. low risk Nuc 2012   . Chronic anticoagulation     PSH:    Past Surgical History  Procedure Laterality Date  . Stents in kidneys    . Coronary artery bypass graft  2000    3 vessel; LIMA to LAD; RCA, OM  . Shunts in femoral arteries    . Cardioversion  8/282012  . Aortic valve replacement      St. Jude  . Femoral bypass    . Cardiac catheterization  11/25/2008    loss of 2/3 (RCA, OM) bypass grafts with patent internal mammary artery to LAD; large collateral filling of RCA ; osteal narrowing of circumflex of ~50%  . Lower  extremity angiogram N/A 12/03/2013    Procedure: LOWER EXTREMITY ANGIOGRAM;  Surgeon: Lorretta Harp, MD;  [facility];  Service: Cardiovascular;  Laterality: N/A;  . Lower extremity angiogram N/A 01/07/2014    Procedure: LOWER EXTREMITY ANGIOGRAM;  Surgeon: Lorretta Harp, MD;  Location: [facility] CATH LAB;  Service: Cardiovascular;  Laterality: N/A;    ALLERGIES:   Chantix; Lyrica; Nsaids; Simvastatin; and Ziac  Prior to Admit Meds:   (Not in a hospital admission) Family HX:    Family History  Problem Relation Age of Onset  . Heart attack Father   . Heart attack Mother   . Lung cancer Sister     x 2  . Kidney cancer Sister   . Kidney disease Sister   . Cancer Brother     ?  . Stroke Brother    Social HX:    Social History   Social History  . Marital Status: Married    Spouse Name: N/A  . Number of Children: 3  . Years of Education: N/A   Occupational History  . Not on file.   Social History Main Topics  . Smoking status: Current Every Day Smoker -- 0.05 packs/day for 40 years    Types: Cigarettes  . Smokeless tobacco: Never Used  . Alcohol Use: No  . Drug Use: No  . Sexual Activity: Not on file   Other Topics Concern  . Not on file   Social History Narrative   Lives in [place] with disabled wife, three sons, and grandson   Retired     ROS:  All 11 ROS were addressed and are negative except what is stated in the HPI  [place]:   06/08/15 2300  BP: 143/63  Pulse: 67  Temp:   Resp: 17   General: Well developed, well nourished, in no acute distress Head: Eyes PERRLA, No xanthomas.   Normal cephalic and atramatic  Lungs:   Clear bilaterally to auscultation and percussion. Heart:   HRRR S1 S2 Pulses are 2+ & equal.            No carotid bruit. No JVD.  No abdominal bruits. No femoral bruits. Abdomen: Bowel sounds are positive, abdomen soft and non-tender without masses  Extremities:   No clubbing, cyanosis or edema.  DP  +1 Neuro: Alert and oriented X 3. Psych:  Good affect, responds appropriately   Labs:   Lab Results  Component Value Date   WBC 6.0 06/08/2015   HGB 12.3* 06/08/2015   HCT 36.8* 06/08/2015   MCV 92.9 06/08/2015   PLT 219 06/08/2015    Recent Labs Lab 06/08/15 2205  NA 132*  K 4.3  CL 98*  CO2 25  BUN 26*  CREATININE 2.05*  CALCIUM 9.2  GLUCOSE 118*   No results found for: CKTOTAL, CKMB, CKMBINDEX, TROPONINI No results found for: PTT Lab Results  Component Value Date   INR 2.02* 06/08/2015   INR 2.24* 05/15/2015   INR 3.32* 04/14/2015     Lab Results  Component Value Date   CHOL 202* 05/15/2015   CHOL 177 02/10/2015   CHOL 203* 10/02/2014   Lab Results  Component Value Date   HDL 36* 05/15/2015   HDL 35* 02/10/2015   HDL 35* 10/02/2014   Lab Results  Component Value Date   LDLCALC 116 05/15/2015   LDLCALC 111* 02/10/2015   LDLCALC 128* 10/02/2014   Lab Results  Component Value Date   TRIG 249* 05/15/2015   TRIG 155* 02/10/2015   TRIG 201* 10/02/2014   Lab Results  Component Value Date   CHOLHDL 5.6* 05/15/2015   CHOLHDL 5.1 02/10/2015   CHOLHDL 5.8 10/02/2014   No results found for: LDLDIRECT    Radiology:  Dg Chest Port 1 View  06/08/2015   CLINICAL DATA:  Tachycardia. History of hypertension, diabetes, COPD and sleep apnea. Smoking history.  EXAM: PORTABLE CHEST - 1 VIEW  COMPARISON:  01/06/2014  FINDINGS: Patient is post median sternotomy. Increase cardiomegaly from prior. Vascular congestion and mild peribronchial cuffing, may reflect pulmonary edema. No large pleural effusion. No confluent airspace disease. No pneumothorax.  IMPRESSION: Increase cardiomegaly with vascular congestion. Peribronchial cuffing, may reflect mild edema. Correlation for CHF recommended.   Electronically Signed   By: Jeb Levering M.D.   On: 06/08/2015 22:50    EKG:  #1 Wide complex tachycardia that is regular #2 atrial fibrillation with nonspecific  IVCD  ASSESSMENT/PLAN 1.  Wide complex tachycardia that spontaneously converted to atrial fibrillation with CVR.  This is most likely VT as it is very regular and QRS morphology completely different from baseline QRS.  ?etiology : ischemia vs. Scar VT.  He has U waves on EKG and QTc is prolonged.  Would stop Celexa for now.  Admit to CCU.  Continue IV Amio gtt.  He has ? Allergy/intolerance ot BB in the past so will hold on BB at present.  Continue Verapamil for now until EF assessed.  Check Mg level.  Check 2D echo in am to reassess LVF.  Cycle troponins.  Will need ischemic w/u with noninvasive nuclear stress test vs. Cath.   Would favor Nuclear stress test unless troponin becomes positive or EF reduced given underlying CKD. Will make NPO after MN if nuclear stress test pursued.   2.  ASCAD with known occlusion of all Vein grafts by cath 2010 and low risk nuc 2012.  He has not had any angina other than today in setting of WCT.  Continue ASA/Imdur/statin 3.  S/P St. Jude AVR on chronic anticoagulation - pharmacy to dose 4.  CKD stage III 5.  PAD  6.  Dyslipidemia - continue statin/fibrate 6.  Chronic atrial fibrillation on chronic anticoagulation. 7.  OSA and COPD 8.  Ischemic DCM - last EF assessment 40-45% - recheck echo  Sueanne Margarita, MD  06/08/2015  11:27 PM

## 2015-06-08 NOTE — ED Notes (Addendum)
Per EMS - pt comes from home c/o central CP and palpitations onset 2030 tonight.  Pt self-administered 325 ASA and 1 NTG and states that the pain felt better afterward.  Upon EMS arrival, pt was in "SVT or VT with a pulse and converted to A-Fib on his own about an hour and a half following the onset."  Pt c/o no pain at this time, denies new SOB, N/V.  Pt states the palpitations have come and gone for a few weeks now but usually only last a few minutes and go away - this time they didn't and he called EMS.  20g. RAC.  HX A-Fib, MI and triple bypass surgery 16 years ago.  Pt on coumadin.  HR before conversion 140-160 and after, 60-70.  98% on 2L O2 nasal cannula, BP 164/102 before conversion and 148/90 afterward. Pt A&O x 4.

## 2015-06-08 NOTE — ED Provider Notes (Signed)
74 year old male, history of prior myocardial infarction, history of coronary artery bypass grafting 3, he states all of these bypasses have gone down and he has known blockages. He also has a history of atrial fibrillation, he states it is never been fast but today his heart rate was up around 150 bpm and he could feel it going fast, associated with chest pain, for this reason he called 911. The EKG transmitted by the paramedics showed a wide complex regular tachycardia consistent with ventricular tachycardia or SVT with wide QRS. Prior EKG shows QRS with a different complex though it was slightly widened. Before the patient arrived to the hospital and without medications he converted into atrial fibrillation which is his based rhythm. The repeat EKG does have a different appearance, I suspect his initial rhythm was ventricular tachycardia. He will need to have further evaluation with labs, x-ray, cardiac monitoring and discussion with cardiologist as I anticipate the need for admission. Will load with amiodarone, watch on the cardiac monitor.  On my exam the patient has scant pitting edema bilaterally, soft abdomen, clear lung sounds, clear heart sounds, irregularly irregular rhythm.   EKG Interpretation  Date/Time:  Sunday June 08 2015 21:58:40 EDT Ventricular Rate:  77 PR Interval:    QRS Duration: 146 QT Interval:  444 QTC Calculation: 502 R Axis:   89 Text Interpretation:  Atrial fibrillation Nonspecific intraventricular conduction delay Repol abnrm suggests ischemia, anterolateral Baseline wander in lead(s) II III aVF since last tracing no significant change Confirmed by Leonore Frankson  MD, Reagann Dolce ([id number]) on 06/08/2015 10:24:13 PM      I saw and evaluated the patient, reviewed the resident's note and I agree with the findings and plan.   Final diagnoses:  Ventricular tachycardia      Noemi Chapel, MD 06/09/15 [phone]

## 2015-06-08 NOTE — ED Provider Notes (Signed)
History   Chief Complaint  Patient presents with  . Chest Pain    HPI  74 year old male past history as below notable for CAD, atrial fibrillation on chronic anticoagulation, heart valve, cardiomyopathy, CHF, COPD, chronic kidney disease, AAA, PAD who presents ED for evaluation of palpitations. Patient reports he was eating a meal when he suddenly began having palpitations. He denies any chest pain at this time. Denies short of breath, abdominal pain, nausea, vomiting, diaphoresis. Prior to today he states he was in his usual state of health. No recent illness reported. Patient reports having similar symptoms a couple weeks ago which lasted for about 10 minutes and resolve spontaneously. Patient reports taking all his medications and denies having any recent medication changes.  EMS was called and they found patient to have initial rate of 160 with wide complex. They obtain multiple rhythm strips during transport and small patient subsequently converted to atrial fibrillation with a rate of 70. During transport patient remained stable, alert and oriented.   Past medical/surgical history, social history, medications, allergies and FH have been reviewed with patient and/or in documentation. Furthermore, if pt family or friend(s) present, additional historical information was obtained from them.  Past Medical History  Diagnosis Date  . Mechanical heart valve present     a. Hx mechanical St Jude AVR 2000.  . Cataracts, bilateral   . OSA (obstructive sleep apnea)   . Permanent atrial fibrillation   . Ischemic cardiomyopathy   . History of echocardiogram 04/17/2013    EF 40-45%; LV hypertrophy; LV mild-mod dilated; AV regurg.; LA severely diated, RA mildly dilated  . History of Doppler ultrasound 04/05/2013    LEAs; small distal abd. aoritc aneuysm is stable; fem-pop graft to R leg has 50-69% blockage  . History of nuclear stress test 05/14/2011    Dipyridamole; moderate perfusion defect in Basal  Infrioer & Mid Inferior region - consistent w/infarct or scar; global LV systolic function mildly reduced; EKG negative for ischemia; low risk scan  . COPD (chronic obstructive pulmonary disease)   . Hyperlipidemia   . Hypertension   . Kidney cysts   . CKD (chronic kidney disease), stage III   . Hiatal hernia   . GERD (gastroesophageal reflux disease)   . Prediabetes   . Gout   . AAA (abdominal aortic aneurysm)   . Vitamin B deficiency   . Peripheral arterial disease     a. s/p aorto bifem BG;  b. 11/2013 PVA distal R Fem-fem stenosis;  c. 12/2013 PTA of dist R femoral bypass graft.  [name] CAD (coronary artery disease)     a. CABG '00. b. all SVGs occluded 2010. c. low risk Nuc 2012   . Chronic anticoagulation    Past Surgical History  Procedure Laterality Date  . Stents in kidneys    . Coronary artery bypass graft  2000    3 vessel; LIMA to LAD; RCA, OM  . Shunts in femoral arteries    . Cardioversion  8/282012  . Aortic valve replacement      St. Jude  . Femoral bypass    . Cardiac catheterization  11/25/2008    loss of 2/3 (RCA, OM) bypass grafts with patent internal mammary artery to LAD; large collateral filling of RCA ; osteal narrowing of circumflex of ~50%  . Lower extremity angiogram N/A 12/03/2013    Procedure: LOWER EXTREMITY ANGIOGRAM;  Surgeon: Robert Harp, MD;  Location: [facility] CATH LAB;  Service: Cardiovascular;  Laterality: N/A;  .  Lower extremity angiogram N/A 01/07/2014    Procedure: LOWER EXTREMITY ANGIOGRAM;  Surgeon: Robert Harp, MD;  Location: [facility] CATH LAB;  Service: Cardiovascular;  Laterality: N/A;   Family History  Problem Relation Age of Onset  . Heart attack Father   . Heart attack Mother   . Lung cancer Sister     x 2  . Kidney cancer Sister   . Kidney disease Sister   . Cancer Brother     ?  . Stroke Brother    Social History  Substance Use Topics  . Smoking status: Current Every Day Smoker -- 0.05 packs/day for 40 years    Types: Cigarettes   . Smokeless tobacco: Never Used  . Alcohol Use: No     Review of Systems Constitutional: - F/C, -fatigue.  HENT: - congestion, -rhinorrhea, -sore throat.   Eyes: - eye pain, -visual disturbance.  Respiratory: - cough, -SOB, -hemoptysis.   Cardiovascular: - CP, +palps.  Gastrointestinal: - N/V/D, -abd pain  Genitourinary: - flank pain, -dysuria, -frequency.  Musculoskeletal: - myalgia/arthritis, -joint swelling, -gait abnormality, -back pain, -neck pain/stiffness, -leg pain/swelling.  Skin: - rash/lesion.  Neurological: - focal weakness, -lightheadedness, -dizziness, -numbness, -HA.  All other systems reviewed and are negative.   Physical Exam  Physical Exam  ED Triage Vitals  Enc Vitals Group     BP 06/08/15 2159 140/60 mmHg     Pulse Rate 06/08/15 2159 77     Resp 06/08/15 2159 14     Temp 06/08/15 2159 98 F (36.7 C)     Temp src --      SpO2 06/08/15 2159 98 %     Weight --      Height --      Head Cir --      Peak Flow --      Pain Score --      Pain Loc --      Pain Edu? --      Excl. in [place]? --    Constitutional: Chronically ill-appearing elderly male in no apparent distress. Head: Normocephalic and atraumatic.  Eyes: Extraocular motion intact, no scleral icterus Mouth: MMM, OP clear Neck: Supple without meningismus, mass, or overt JVD Respiratory: No respiratory distress. Normal WOB. No w/r/g. CV: Normal rate, irregular rhythm, no obvious murmurs.  Pulses +2 and symmetric. Euvolemic Abdomen: Soft, NT, ND, no r/g. No pulsatile mass.  MSK: Extremities are atraumatic without deformity, ROM intact. 1+ pitting edema bilateral lower extremities Skin: Warm, dry, intact without rash Neuro: AAOx4, MAE 5/5 sym, no focal deficit noted   ED Course  Procedures   Labs Reviewed  CBC - Abnormal; Notable for the following:    RBC 3.96 (*)    Hemoglobin 12.3 (*)    HCT 36.8 (*)    All other components within normal limits  BASIC METABOLIC PANEL  PROTIME-INR   I-STAT TROPOININ, ED   I personally reviewed and interpreted all labs.  Dg Chest Port 1 View  06/08/2015   CLINICAL DATA:  Tachycardia. History of hypertension, diabetes, COPD and sleep apnea. Smoking history.  EXAM: PORTABLE CHEST - 1 VIEW  COMPARISON:  01/06/2014  FINDINGS: Patient is post median sternotomy. Increase cardiomegaly from prior. Vascular congestion and mild peribronchial cuffing, may reflect pulmonary edema. No large pleural effusion. No confluent airspace disease. No pneumothorax.  IMPRESSION: Increase cardiomegaly with vascular congestion. Peribronchial cuffing, may reflect mild edema. Correlation for CHF recommended.   Electronically Signed   By: Fonnie Birkenhead.D.  On: 06/08/2015 22:50   I personally viewed above image(s) which were used in my medical decision making. Formal interpretations by Radiology.   EKG Interpretation  Date/Time:  Sunday June 08 2015 21:58:40 EDT Ventricular Rate:  77 PR Interval:    QRS Duration: 146 QT Interval:  444 QTC Calculation: 502 R Axis:   89 Text Interpretation:  Atrial fibrillation Nonspecific intraventricular conduction delay Repol abnrm suggests ischemia, anterolateral Baseline wander in lead(s) II III aVF since last tracing no significant change Confirmed by MILLER  MD, BRIAN ([id number]) on 06/08/2015 10:24:13 PM       MDM: [name] is a 74 y.o. male with H&P as above who p/w CC:  Palpitations  On arrival, hemodynamically stable and in no apparent distress. EKG shows atrial fibrillation with rate of 70. Rhythm strips from EMS show wide complex at a rate of 160 and this is concerning for ventricular tachycardia. Patient will get screening labs and loaded with amiodarone and we will consult cardiology. Cardiology was consult and they will admit the patient.  While in ED patient remained stable. Old records reviewed (if available). Labs and imaging reviewed personally by myself and considered in medical decision making  if ordered.  Clinical Impression: 1. Ventricular tachycardia     Disposition: Admit  Condition: stable  I have discussed the results, Dx and Tx plan with the pt(& family if present). He/she/they expressed understanding and agree(s) with the plan.  Pt seen in conjunction with Dr. Noemi Chapel, MD  Kirstie Peri, [facility] Resident - PGY-3     Kirstie Peri, MD 06/08/15 [id number]  Noemi Chapel, MD 06/09/15 [phone]

## 2015-06-08 NOTE — ED Notes (Signed)
MD at bedside. 

## 2015-06-09 ENCOUNTER — Other Ambulatory Visit (HOSPITAL_COMMUNITY): Payer: PPO

## 2015-06-09 ENCOUNTER — Inpatient Hospital Stay (HOSPITAL_COMMUNITY): Payer: PPO

## 2015-06-09 DIAGNOSIS — I482 Chronic atrial fibrillation: Secondary | ICD-10-CM

## 2015-06-09 DIAGNOSIS — R072 Precordial pain: Secondary | ICD-10-CM

## 2015-06-09 DIAGNOSIS — I472 Ventricular tachycardia: Principal | ICD-10-CM

## 2015-06-09 DIAGNOSIS — I4891 Unspecified atrial fibrillation: Secondary | ICD-10-CM

## 2015-06-09 LAB — TSH: TSH: 2.49 u[IU]/mL (ref 0.350–4.500)

## 2015-06-09 LAB — MAGNESIUM: Magnesium: 2 mg/dL (ref 1.7–2.4)

## 2015-06-09 LAB — CBC WITH DIFFERENTIAL/PLATELET
BASOS PCT: 1 % (ref 0–1)
Basophils Absolute: 0 10*3/uL (ref 0.0–0.1)
EOS ABS: 0.2 10*3/uL (ref 0.0–0.7)
EOS PCT: 3 % (ref 0–5)
HCT: 36.6 % — ABNORMAL LOW (ref 39.0–52.0)
Hemoglobin: 12.5 g/dL — ABNORMAL LOW (ref 13.0–17.0)
Lymphocytes Relative: 30 % (ref 12–46)
Lymphs Abs: 2.3 10*3/uL (ref 0.7–4.0)
MCH: 31.8 pg (ref 26.0–34.0)
MCHC: 34.2 g/dL (ref 30.0–36.0)
MCV: 93.1 fL (ref 78.0–100.0)
MONO ABS: 0.6 10*3/uL (ref 0.1–1.0)
MONOS PCT: 8 % (ref 3–12)
NEUTROS PCT: 58 % (ref 43–77)
Neutro Abs: 4.4 10*3/uL (ref 1.7–7.7)
PLATELETS: 217 10*3/uL (ref 150–400)
RBC: 3.93 MIL/uL — ABNORMAL LOW (ref 4.22–5.81)
RDW: 14.2 % (ref 11.5–15.5)
WBC: 7.5 10*3/uL (ref 4.0–10.5)

## 2015-06-09 LAB — COMPREHENSIVE METABOLIC PANEL
ALT: 16 U/L — AB (ref 17–63)
AST: 25 U/L (ref 15–41)
Albumin: 3.9 g/dL (ref 3.5–5.0)
Alkaline Phosphatase: 51 U/L (ref 38–126)
Anion gap: 9 (ref 5–15)
BUN: 27 mg/dL — ABNORMAL HIGH (ref 6–20)
CHLORIDE: 99 mmol/L — AB (ref 101–111)
CO2: 25 mmol/L (ref 22–32)
CREATININE: 2.12 mg/dL — AB (ref 0.61–1.24)
Calcium: 9.3 mg/dL (ref 8.9–10.3)
GFR calc non Af Amer: 29 mL/min — ABNORMAL LOW (ref >60)
GFR, EST AFRICAN AMERICAN: 34 mL/min — AB (ref >60)
Glucose, Bld: 78 mg/dL (ref 65–99)
Potassium: 4.6 mmol/L (ref 3.5–5.1)
SODIUM: 133 mmol/L — AB (ref 135–145)
Total Bilirubin: 0.5 mg/dL (ref 0.3–1.2)
Total Protein: 6.9 g/dL (ref 6.5–8.1)

## 2015-06-09 LAB — BASIC METABOLIC PANEL
Anion gap: 8 (ref 5–15)
BUN: 26 mg/dL — ABNORMAL HIGH (ref 6–20)
CALCIUM: 9 mg/dL (ref 8.9–10.3)
CHLORIDE: 100 mmol/L — AB (ref 101–111)
CO2: 26 mmol/L (ref 22–32)
CREATININE: 2.05 mg/dL — AB (ref 0.61–1.24)
GFR calc Af Amer: 36 mL/min — ABNORMAL LOW (ref >60)
GFR calc non Af Amer: 31 mL/min — ABNORMAL LOW (ref >60)
GLUCOSE: 106 mg/dL — AB (ref 65–99)
Potassium: 4.2 mmol/L (ref 3.5–5.1)
Sodium: 134 mmol/L — ABNORMAL LOW (ref 135–145)

## 2015-06-09 LAB — LIPID PANEL
Cholesterol: 187 mg/dL (ref 0–200)
HDL: 30 mg/dL — ABNORMAL LOW (ref >40)
LDL CALC: 119 mg/dL — AB (ref 0–99)
Total CHOL/HDL Ratio: 6.2 RATIO
Triglycerides: 190 mg/dL — ABNORMAL HIGH (ref <150)
VLDL: 38 mg/dL (ref 0–40)

## 2015-06-09 LAB — MRSA PCR SCREENING: MRSA by PCR: NEGATIVE

## 2015-06-09 LAB — PROTIME-INR
INR: 2.16 — AB (ref 0.00–1.49)
INR: 2.25 — AB (ref 0.00–1.49)
PROTHROMBIN TIME: 23.9 s — AB (ref 11.6–15.2)
PROTHROMBIN TIME: 24.6 s — AB (ref 11.6–15.2)

## 2015-06-09 LAB — CBC
HEMATOCRIT: 35.6 % — AB (ref 39.0–52.0)
HEMOGLOBIN: 12 g/dL — AB (ref 13.0–17.0)
MCH: 31.5 pg (ref 26.0–34.0)
MCHC: 33.7 g/dL (ref 30.0–36.0)
MCV: 93.4 fL (ref 78.0–100.0)
Platelets: 211 10*3/uL (ref 150–400)
RBC: 3.81 MIL/uL — ABNORMAL LOW (ref 4.22–5.81)
RDW: 14.3 % (ref 11.5–15.5)
WBC: 7 10*3/uL (ref 4.0–10.5)

## 2015-06-09 LAB — TROPONIN I
TROPONIN I: 0.32 ng/mL — AB (ref <0.031)
TROPONIN I: 0.18 ng/mL — AB (ref <0.031)
Troponin I: 0.22 ng/mL — ABNORMAL HIGH (ref <0.031)

## 2015-06-09 LAB — APTT: aPTT: 37 seconds (ref 24–37)

## 2015-06-09 MED ORDER — ISOSORBIDE MONONITRATE ER 30 MG PO TB24
30.0000 mg | ORAL_TABLET | Freq: Every day | ORAL | Status: DC
  Administered 2015-06-09 – 2015-06-11 (×3): 30 mg via ORAL
  Filled 2015-06-09 (×3): qty 1

## 2015-06-09 MED ORDER — ALLOPURINOL 300 MG PO TABS
300.0000 mg | ORAL_TABLET | Freq: Every day | ORAL | Status: DC
  Administered 2015-06-09 – 2015-06-11 (×3): 300 mg via ORAL
  Filled 2015-06-09 (×3): qty 1

## 2015-06-09 MED ORDER — PRAVASTATIN SODIUM 40 MG PO TABS
40.0000 mg | ORAL_TABLET | Freq: Every day | ORAL | Status: DC
  Administered 2015-06-09 – 2015-06-10 (×2): 40 mg via ORAL
  Filled 2015-06-09 (×2): qty 1

## 2015-06-09 MED ORDER — ONDANSETRON HCL 4 MG/2ML IJ SOLN: 4.0000 mg | Freq: Four times a day (QID) | INTRAMUSCULAR | Status: DC | PRN

## 2015-06-09 MED ORDER — FENOFIBRATE 160 MG PO TABS
160.0000 mg | ORAL_TABLET | Freq: Every day | ORAL | Status: DC
  Administered 2015-06-09 – 2015-06-11 (×3): 160 mg via ORAL
  Filled 2015-06-09 (×4): qty 1

## 2015-06-09 MED ORDER — WARFARIN - PHARMACIST DOSING INPATIENT
Freq: Every day | Status: DC
  Administered 2015-06-09 – 2015-06-10 (×2)

## 2015-06-09 MED ORDER — ASPIRIN EC 81 MG PO TBEC
81.0000 mg | DELAYED_RELEASE_TABLET | Freq: Every day | ORAL | Status: DC
  Administered 2015-06-09 – 2015-06-11 (×3): 81 mg via ORAL
  Filled 2015-06-09 (×3): qty 1

## 2015-06-09 MED ORDER — FAMOTIDINE 20 MG PO TABS
40.0000 mg | ORAL_TABLET | Freq: Every day | ORAL | Status: DC
  Administered 2015-06-09 – 2015-06-11 (×3): 40 mg via ORAL
  Filled 2015-06-09: qty 1
  Filled 2015-06-09: qty 2
  Filled 2015-06-09: qty 1

## 2015-06-09 MED ORDER — WARFARIN SODIUM 5 MG PO TABS
5.0000 mg | ORAL_TABLET | Freq: Once | ORAL | Status: AC
  Administered 2015-06-09: 5 mg via ORAL
  Filled 2015-06-09: qty 1

## 2015-06-09 MED ORDER — AMIODARONE HCL 200 MG PO TABS
200.0000 mg | ORAL_TABLET | Freq: Two times a day (BID) | ORAL | Status: DC
  Administered 2015-06-09 – 2015-06-11 (×4): 200 mg via ORAL
  Filled 2015-06-09 (×5): qty 1

## 2015-06-09 MED ORDER — WARFARIN SODIUM 2.5 MG PO TABS
2.5000 mg | ORAL_TABLET | Freq: Once | ORAL | Status: AC
  Administered 2015-06-09: 2.5 mg via ORAL
  Filled 2015-06-09: qty 1

## 2015-06-09 MED ORDER — NITROGLYCERIN 0.4 MG SL SUBL: 0.4000 mg | SUBLINGUAL_TABLET | SUBLINGUAL | Status: DC | PRN

## 2015-06-09 MED ORDER — FERROUS SULFATE 325 (65 FE) MG PO TABS
325.0000 mg | ORAL_TABLET | Freq: Every day | ORAL | Status: DC
  Administered 2015-06-09 – 2015-06-11 (×3): 325 mg via ORAL
  Filled 2015-06-09 (×4): qty 1

## 2015-06-09 MED ORDER — ACETAMINOPHEN 325 MG PO TABS: 650.0000 mg | ORAL_TABLET | ORAL | Status: DC | PRN

## 2015-06-09 MED ORDER — SODIUM CHLORIDE 0.9 % IJ SOLN
3.0000 mL | Freq: Two times a day (BID) | INTRAMUSCULAR | Status: DC
  Administered 2015-06-09 – 2015-06-10 (×4): 3 mL via INTRAVENOUS

## 2015-06-09 MED ORDER — ASPIRIN 300 MG RE SUPP: 300.0000 mg | RECTAL | Status: DC

## 2015-06-09 MED ORDER — TIOTROPIUM BROMIDE MONOHYDRATE 18 MCG IN CAPS
18.0000 ug | ORAL_CAPSULE | Freq: Every day | RESPIRATORY_TRACT | Status: DC | PRN
Start: 2015-06-09 — End: 2015-06-11

## 2015-06-09 MED ORDER — LEVOTHYROXINE SODIUM 50 MCG PO TABS
50.0000 ug | ORAL_TABLET | Freq: Every day | ORAL | Status: DC
  Administered 2015-06-09 – 2015-06-11 (×3): 50 ug via ORAL
  Filled 2015-06-09 (×4): qty 1

## 2015-06-09 MED ORDER — SPIRONOLACTONE 25 MG PO TABS
25.0000 mg | ORAL_TABLET | Freq: Every day | ORAL | Status: DC
  Administered 2015-06-09 – 2015-06-11 (×3): 25 mg via ORAL
  Filled 2015-06-09 (×3): qty 1

## 2015-06-09 MED ORDER — ASPIRIN 81 MG PO CHEW
324.0000 mg | CHEWABLE_TABLET | ORAL | Status: DC
Start: 2015-06-09 — End: 2015-06-09

## 2015-06-09 NOTE — Progress Notes (Signed)
  Echocardiogram 2D Echocardiogram has been performed.  Robert Stanton 06/09/2015, 12:08 PM

## 2015-06-09 NOTE — Care Management Note (Signed)
Case Management Note  Patient Details  Name: Robert Stanton MRN: [id number] Date of Birth: Nov 19, 1940  Subjective/Objective:     Adm w wide complex tachyc               Action/Plan: lives w wife, pcp dr Unk Pinto   Expected Discharge Date:  06/11/15               Expected Discharge Plan:  Home/Self Care  In-House Referral:     Discharge planning Services     Post Acute Care Choice:    Choice offered to:     DME Arranged:    DME Agency:     HH Arranged:    [place] Agency:     Status of Service:     Medicare Important Message Given:    Date Medicare IM Given:    Medicare IM give by:    Date Additional Medicare IM Given:    Additional Medicare Important Message give by:     If discussed at [place] of Stay Meetings, dates discussed:    Additional Comments: ur review done.  Lacretia Leigh, RN 06/09/2015, 8:50 AM

## 2015-06-09 NOTE — Progress Notes (Signed)
ANTICOAGULATION CONSULT NOTE - Initial Consult  Pharmacy Consult for Warfarin  Indication: atrial fibrillation and St Jude AVR  Allergies  Allergen Reactions  . Chantix [Varenicline] Nausea And Vomiting  . Lyrica [Pregabalin]     Swelling, couldn't breathe  . Nsaids Other (See Comments)    Unknown   . Simvastatin     Pt said he had a "bleed out" after taking it  . Ziac [Bisoprolol-Hydrochlorothiazide]     fatigue    Patient Measurements: Height: 5' 8.5" (174 cm) Weight: 225 lb (102.059 kg) IBW/kg (Calculated) : 69.55  Vital Signs: Temp: 98 F (36.7 C) (08/21 2159) BP: 143/68 mmHg (08/21 2330) Pulse Rate: 65 (08/21 2330)  Labs:  Recent Labs  06/08/15 2205  HGB 12.3*  HCT 36.8*  PLT 219  LABPROT 22.7*  INR 2.02*  CREATININE 2.05*    Estimated Creatinine Clearance: 38.1 mL/min (by C-G formula based on Cr of 2.05).   Medical History: Past Medical History  Diagnosis Date  . Mechanical heart valve present     a. Hx mechanical St Jude AVR 2000.  . Cataracts, bilateral   . OSA (obstructive sleep apnea)   . Permanent atrial fibrillation   . Ischemic cardiomyopathy   . History of echocardiogram 04/17/2013    EF 40-45%; LV hypertrophy; LV mild-mod dilated; AV regurg.; LA severely diated, RA mildly dilated  . History of Doppler ultrasound 04/05/2013    LEAs; small distal abd. aoritc aneuysm is stable; fem-pop graft to R leg has 50-69% blockage  . History of nuclear stress test 05/14/2011    Dipyridamole; moderate perfusion defect in Basal Infrioer & Mid Inferior region - consistent w/infarct or scar; global LV systolic function mildly reduced; EKG negative for ischemia; low risk scan  . COPD (chronic obstructive pulmonary disease)   . Hyperlipidemia   . Hypertension   . Kidney cysts   . CKD (chronic kidney disease), stage III   . Hiatal hernia   . GERD (gastroesophageal reflux disease)   . Prediabetes   . Gout   . AAA (abdominal aortic aneurysm)   . Vitamin B  deficiency   . Peripheral arterial disease     a. s/p aorto bifem BG;  b. 11/2013 PVA distal R Fem-fem stenosis;  c. 12/2013 PTA of dist R femoral bypass graft.  [name] CAD (coronary artery disease)     a. CABG '00. b. all SVGs occluded 2010. c. low risk Nuc 2012   . Chronic anticoagulation     Assessment: 74 y/o M with wide complex tachycardia that converted to afib, pt has chronic afib and is on warfarin, also on warfarin for St Jude AVR, INR sub-therapeutic at 2.02 (goal 2.5-3.5 from some older anti-coag notes), started on amiodarone (DDI with warfarin-monitor), other labs/meds reviewed.   Goal of Therapy:  INR 2.5-3.5 Monitor platelets by anticoagulation protocol: Yes   Plan:  -Warfarin 5 mg PO x 1 now (last dose 8/20), not increasing dose now due to Amiodarone initiation -Daily PT/INR -Monitor for bleeding -F/U plans for [employer] 06/09/2015,12:20 AM

## 2015-06-09 NOTE — Progress Notes (Signed)
Patient Profile: 74 year old male with a prior history of CKD stage III, PAD, CAD (s/p CABG in 2000, all VG occluded by cath 2010, low risk nuc 2012), s/p mechanical St. Jude AVR in 2000, LV dysfunction EF 40-45% in 2014, OSA, COPD, permanent AF and h/o flutter (s/p DCCV in 2012 but now chronic AF).Also with PVD. Admitted 06/08/15 for symptomatic WCT worrisome for VT.   Subjective: No complaints. Denies any recurrent symptoms overnight. Currently CP free.   Objective: Vital signs in last 24 hours: Temp:  [97.9 F (36.6 C)-98 F (36.7 C)] 97.9 F (36.6 C) (08/22 0400) Pulse Rate:  [60-77] 62 (08/22 0700) Resp:  [11-23] 17 (08/22 0700) BP: (119-151)/(49-80) 150/64 mmHg (08/22 0700) SpO2:  [95 %-98 %] 96 % (08/22 0700) Weight:  [225 lb (102.059 kg)-227 lb 4.7 oz (103.1 kg)] 227 lb 4.7 oz (103.1 kg) (08/22 0000) Last BM Date: 06/08/15  Intake/Output from previous day: 08/21 0701 - 08/22 0700 In: 227.1 [I.V.:227.1] Out: 1150 [Urine:1150] Intake/Output this shift:    Medications Current Facility-Administered Medications  Medication Dose Route Frequency Provider Last Rate Last Dose  . acetaminophen (TYLENOL) tablet 650 mg  650 mg Oral Q4H PRN Sueanne Margarita, MD      . allopurinol (ZYLOPRIM) tablet 300 mg  300 mg Oral Daily Sueanne Margarita, MD      . amiodarone (NEXTERONE PREMIX) 360 MG/200ML (1.8 mg/mL) IV infusion  30 mg/hr Intravenous Continuous Sueanne Margarita, MD 16.7 mL/hr at 06/09/15 0600 30 mg/hr at 06/09/15 0600  . aspirin EC tablet 81 mg  81 mg Oral Daily Sueanne Margarita, MD      . famotidine (PEPCID) tablet 40 mg  40 mg Oral Daily Sueanne Margarita, MD      . fenofibrate tablet 160 mg  160 mg Oral Daily Sueanne Margarita, MD      . ferrous sulfate tablet 325 mg  325 mg Oral Q breakfast Sueanne Margarita, MD      . isosorbide mononitrate (IMDUR) 24 hr tablet 30 mg  30 mg Oral Daily Sueanne Margarita, MD      . levothyroxine (SYNTHROID, LEVOTHROID) tablet 50 mcg  50 mcg Oral QAC  breakfast Sueanne Margarita, MD      . nitroGLYCERIN (NITROSTAT) SL tablet 0.4 mg  0.4 mg Sublingual Q5 Min x 3 PRN Sueanne Margarita, MD      . ondansetron (ZOFRAN) injection 4 mg  4 mg Intravenous Q6H PRN Sueanne Margarita, MD      . pravastatin (PRAVACHOL) tablet 40 mg  40 mg Oral q1800 Sueanne Margarita, MD      . spironolactone (ALDACTONE) tablet 25 mg  25 mg Oral Daily Sueanne Margarita, MD      . tiotropium (SPIRIVA) inhalation capsule 18 mcg  18 mcg Inhalation Daily PRN Sueanne Margarita, MD      . Warfarin - Pharmacist Dosing Inpatient   Does not apply q1800 Erenest Blank, [facility]        PE: General appearance: alert, cooperative and no distress Neck: no carotid bruit and no JVD Lungs: clear to auscultation bilaterally Heart: irregularly irregular rhythm and regular rate Extremities: no LEE Pulses: 2+ and symmetric Skin: warm and dry Neurologic: Grossly normal  Lab Results:   Recent Labs  06/08/15 2205 06/09/15 0052 06/09/15 0535  WBC 6.0 7.5 7.0  HGB 12.3* 12.5* 12.0*  HCT 36.8* 36.6* 35.6*  PLT 219 217 211   BMET  Recent Labs  06/08/15 2205 06/09/15 0052 06/09/15 0535  NA 132* 133* 134*  K 4.3 4.6 4.2  CL 98* 99* 100*  CO2 25 25 26   GLUCOSE 118* 78 106*  BUN 26* 27* 26*  CREATININE 2.05* 2.12* 2.05*  CALCIUM 9.2 9.3 9.0   PT/INR  Recent Labs  06/08/15 2205 06/09/15 0052 06/09/15 0535  LABPROT 22.7* 23.9* 24.6*  INR 2.02* 2.16* 2.25*   Cholesterol  Recent Labs  06/09/15 0231  CHOL 187   Cardiac Panel (last 3 results)  Recent Labs  06/09/15 0052 06/09/15 0535  TROPONINI 0.22* 0.32*    Studies/Results: 2D echo pending   Assessment/Plan  Active Problems:   Chest pain  1. Wide complex tachycardia: spontaneously converted to atrial fibrillation with CVR.Presenting arrhthymia felt to be VT (regular and QRS morphology completely different from baseline QRS). QTc also slightly prolonged on admit at 502 mc. Celexa now on hold. K stable at 4.2. Mg WNL at  2.0. TSH also normal. Cardiac enzymes also elevated at 0.22-->0.32 (? Demand ischemia from tachycardia vs ACS). Will need additional w/u. 2D echo pending. No further arrhthymias on IV amiodarone. Will continue IV amiodarone. INR is therapeutic at 2.25.   2. Elevated Troponin: Cardiac enzymes elevated at 0.22-->0.32 (? Demand ischemia from tachycardia vs ACS). He has h/o CAD and known occlusion of all VG and had a low risk NST in 2012. Given underlying CKD with Scr ~2, would recommend repeat NST to r/o ischemia before cath.   3. Chronic Afib: rate controlled. INR therapeutic.   4. CKD: stage III  5. Dyslipidemia: continue statin/fibrate  6.  Ischemic DCM:  last EF assessment 40-45% - recheck echo   LOS: 1 day   [name] 06/09/2015 7:32 AM  The patient was seen, examined and discussed with Brittainy M. Rosita Fire, PA-C and I agree with the above.   74 year old male with h/o CAD, CABG, AVR admitted with wide complex tachycardia with ventricular rate 150 BPM that cardioverted into a-fib on amiodarone drip. ECG strips from EMS are not available. His Troponin is elevated in the settings of demand ischemia and CKD stage IV (Crea 2.03), we will continue warfarin, we will trend troponin, no cath for now, we will follow echocardiogram for new wall motion abnormalities.   We will continue amiodarone load, transition to PO 400 mg po BID once loading completed and ask EP for further directions regarding treatment.   Dorothy Spark 06/09/2015

## 2015-06-09 NOTE — Progress Notes (Signed)
eLink Physician-Brief Progress Note Patient Name: Robert Stanton DOB: 16-Jul-1941 MRN: [id number]   Date of Service  06/09/2015  HPI/Events of Note  74 year old male with a prior history of CKD stage III, PAD, CAD (s/p CABG in 2000, all VG occluded by cath 2010, low risk nuc 2012), s/p mechanical St. Jude AVR in 2000, LV dysfunction EF 40-45% in 2014, OSA, COPD, permanent AF and h/o flutter (s/p DCCV in 2012 but now chronic AF). He also has peripheral arterial disease status post right to left femoral to femoral crossover grafting as well as bilateral femoral-popliteal grafting. He recently underwent arterial brachial indices testing showing an ABI of 0.64 on the right. Peripheral angiography was performed on 12/03/2013 revealing distal right femoral to femoral graft stenosis and underwent successful percutaneous transluminal angioplasty of the distal stenosis in the femoral to femoral bypass graft with reduction in stenosis from 90% to less than 30%. Now admitted with wide complex tachycardia. Medical management includes ASA, Imdur, Pravachol, Aldactone, Warfarin, Fenofibrate and Amiodarone IV infusion. Cardiology directing patient management.   eICU Interventions  Continue present management.      Intervention Category Evaluation Type: New Patient Evaluation  Lysle Dingwall 06/09/2015, 12:45 AM

## 2015-06-09 NOTE — Consult Note (Signed)
Cardiologist:  Hilty Reason for Consult:  Wide complex tachycardia Referring Physician: Jasson Siegmann is an 74 y.o. male.  HPI:   The patient is a 74yo male with a history of CKD stage III, PAD, CAD (s/p CABG in 2000, all VG occluded by cath 2010, low risk nuc 2012), s/p mechanical St. Jude AVR in 2000, LV dysfunction EF 40-45% in 2014, OSA, COPD, permanent AF and h/o flutter (s/p DCCV in 2012 but now chronic AF). He also has peripheral arterial disease status post right to left femoral to femoral crossover grafting as well as bilateral femoral-popliteal grafting. He recently underwent arterial brachial indices testing showing an ABI of 0.64 on the right. Peripheral angiography was performed on 12/03/2013 revealing distal right femoral to femoral graft stenosis and underwent successful percutaneous transluminal angioplasty of the distal stenosis in the femoral to femoral bypass graft with reduction in stenosis from 90% to less than 30%.   He presented with an elevated HR.  The patient said it was up to 167.  He was feeling mild chest pressure at that rate but other wise denies nausea, vomiting, fever, shortness of breath, orthopnea, dizziness, PND, cough, congestion, abdominal pain, lower extremity edema.  His legs always hurt when he walks.  When he was in the ambulance, his HR returned to normal and the CP resolved.     Past Medical History  Diagnosis Date  . Mechanical heart valve present     a. Hx mechanical St Jude AVR 2000.  . Cataracts, bilateral   . OSA (obstructive sleep apnea)   . Permanent atrial fibrillation   . Ischemic cardiomyopathy   . History of echocardiogram 04/17/2013    EF 40-45%; LV hypertrophy; LV mild-mod dilated; AV regurg.; LA severely diated, RA mildly dilated  . History of Doppler ultrasound 04/05/2013    LEAs; small distal abd. aoritc aneuysm is stable; fem-pop graft to R leg has 50-69% blockage  . History of nuclear stress test 05/14/2011   Dipyridamole; moderate perfusion defect in Basal Infrioer & Mid Inferior region - consistent w/infarct or scar; global LV systolic function mildly reduced; EKG negative for ischemia; low risk scan  . COPD (chronic obstructive pulmonary disease)   . Hyperlipidemia   . Hypertension   . Kidney cysts   . CKD (chronic kidney disease), stage III   . Hiatal hernia   . GERD (gastroesophageal reflux disease)   . Prediabetes   . Gout   . AAA (abdominal aortic aneurysm)   . Vitamin B deficiency   . Peripheral arterial disease     a. s/p aorto bifem BG;  b. 11/2013 PVA distal R Fem-fem stenosis;  c. 12/2013 PTA of dist R femoral bypass graft.  [name] CAD (coronary artery disease)     a. CABG '00. b. all SVGs occluded 2010. c. low risk Nuc 2012   . Chronic anticoagulation     Past Surgical History  Procedure Laterality Date  . Stents in kidneys    . Coronary artery bypass graft  2000    3 vessel; LIMA to LAD; RCA, OM  . Shunts in femoral arteries    . Cardioversion  8/282012  . Aortic valve replacement      St. Jude  . Femoral bypass    . Cardiac catheterization  11/25/2008    loss of 2/3 (RCA, OM) bypass grafts with patent internal mammary artery to LAD; large collateral filling of RCA ; osteal narrowing of circumflex of ~50%  .  Lower extremity angiogram N/A 12/03/2013    Procedure: LOWER EXTREMITY ANGIOGRAM;  Surgeon: Lorretta Harp, MD;  Location[facility] CATH LAB;  Service: Cardiovascular;  Laterality: N/A;  . Lower extremity angiogram N/A 01/07/2014    Procedure: LOWER EXTREMITY ANGIOGRAM;  Surgeon: Lorretta Harp, MD;  Location: [facility] CATH LAB;  Service: Cardiovascular;  Laterality: N/A;    Family History  Problem Relation Age of Onset  . Heart attack Father   . Heart attack Mother   . Lung cancer Sister     x 2  . Kidney cancer Sister   . Kidney disease Sister   . Cancer Brother     ?  . Stroke Brother     Social History:  reports that he has been smoking Cigarettes.  He has a 2  pack-year smoking history. He has never used smokeless tobacco. He reports that he does not drink alcohol or use illicit drugs.  Allergies:  Allergies  Allergen Reactions  . Chantix [Varenicline] Nausea And Vomiting  . Lyrica [Pregabalin]     Swelling, couldn't breathe  . Nsaids Other (See Comments)    Unknown   . Simvastatin     Pt said he had a "bleed out" after taking it  . Ziac [Bisoprolol-Hydrochlorothiazide]     fatigue    Medications:  Scheduled Meds: . allopurinol  300 mg Oral Daily  . aspirin EC  81 mg Oral Daily  . famotidine  40 mg Oral Daily  . fenofibrate  160 mg Oral Daily  . ferrous sulfate  325 mg Oral Q breakfast  . isosorbide mononitrate  30 mg Oral Daily  . levothyroxine  50 mcg Oral QAC breakfast  . pravastatin  40 mg Oral q1800  . spironolactone  25 mg Oral Daily  . warfarin  2.5 mg Oral ONCE-1800  . Warfarin - Pharmacist Dosing Inpatient   Does not apply q1800   Continuous Infusions: . amiodarone 30 mg/hr (06/09/15 0600)   PRN Meds:.acetaminophen, nitroGLYCERIN, ondansetron (ZOFRAN) IV, tiotropium   Results for orders placed or performed during the hospital encounter of 06/08/15 (from the past 48 hour(s))  CBC     Status: Abnormal   Collection Time: 06/08/15 10:05 PM  Result Value Ref Range   WBC 6.0 4.0 - 10.5 K/uL   RBC 3.96 (L) 4.22 - 5.81 MIL/uL   Hemoglobin 12.3 (L) 13.0 - 17.0 g/dL   HCT 36.8 (L) 39.0 - 52.0 %   MCV 92.9 78.0 - 100.0 fL   MCH 31.1 26.0 - 34.0 pg   MCHC 33.4 30.0 - 36.0 g/dL   RDW 14.1 11.5 - 15.5 %   Platelets 219 150 - 400 K/uL  Basic metabolic panel     Status: Abnormal   Collection Time: 06/08/15 10:05 PM  Result Value Ref Range   Sodium 132 (L) 135 - 145 mmol/L   Potassium 4.3 3.5 - 5.1 mmol/L   Chloride 98 (L) 101 - 111 mmol/L   CO2 25 22 - 32 mmol/L   Glucose, Bld 118 (H) 65 - 99 mg/dL   BUN 26 (H) 6 - 20 mg/dL   Creatinine, Ser 2.05 (H) 0.61 - 1.24 mg/dL   Calcium 9.2 8.9 - 10.3 mg/dL   GFR calc non Af  Amer 31 (L) >60 mL/min   GFR calc Af Amer 36 (L) >60 mL/min    Comment: (NOTE) The eGFR has been calculated using the CKD EPI equation. This calculation has not been validated in all clinical situations. eGFR's  persistently <60 mL/min signify possible Chronic Kidney Disease.    Anion gap 9 5 - 15  Protime-INR     Status: Abnormal   Collection Time: 06/08/15 10:05 PM  Result Value Ref Range   Prothrombin Time 22.7 (H) 11.6 - 15.2 seconds   INR 2.02 (H) 0.00 - 1.49  I-stat troponin, ED     Status: None   Collection Time: 06/08/15 10:19 PM  Result Value Ref Range   Troponin i, poc 0.02 0.00 - 0.08 ng/mL   Comment 3            Comment: Due to the release kinetics of cTnI, a negative result within the first hours of the onset of symptoms does not rule out myocardial infarction with certainty. If myocardial infarction is still suspected, repeat the test at appropriate intervals.   MRSA PCR Screening     Status: None   Collection Time: 06/09/15 12:07 AM  Result Value Ref Range   MRSA by PCR NEGATIVE NEGATIVE    Comment:        The GeneXpert MRSA Assay (FDA approved for NASAL specimens only), is one component of a comprehensive MRSA colonization surveillance program. It is not intended to diagnose MRSA infection nor to guide or monitor treatment for MRSA infections.   Comprehensive metabolic panel     Status: Abnormal   Collection Time: 06/09/15 12:52 AM  Result Value Ref Range   Sodium 133 (L) 135 - 145 mmol/L   Potassium 4.6 3.5 - 5.1 mmol/L   Chloride 99 (L) 101 - 111 mmol/L   CO2 25 22 - 32 mmol/L   Glucose, Bld 78 65 - 99 mg/dL   BUN 27 (H) 6 - 20 mg/dL   Creatinine, Ser 2.12 (H) 0.61 - 1.24 mg/dL   Calcium 9.3 8.9 - 10.3 mg/dL   Total Protein 6.9 6.5 - 8.1 g/dL   Albumin 3.9 3.5 - 5.0 g/dL   AST 25 15 - 41 U/L   ALT 16 (L) 17 - 63 U/L   Alkaline Phosphatase 51 38 - 126 U/L   Total Bilirubin 0.5 0.3 - 1.2 mg/dL   GFR calc non Af Amer 29 (L) >60 mL/min   GFR  calc Af Amer 34 (L) >60 mL/min    Comment: (NOTE) The eGFR has been calculated using the CKD EPI equation. This calculation has not been validated in all clinical situations. eGFR's persistently <60 mL/min signify possible Chronic Kidney Disease.    Anion gap 9 5 - 15  Magnesium     Status: None   Collection Time: 06/09/15 12:52 AM  Result Value Ref Range   Magnesium 2.0 1.7 - 2.4 mg/dL  Troponin I     Status: Abnormal   Collection Time: 06/09/15 12:52 AM  Result Value Ref Range   Troponin I 0.22 (H) <0.031 ng/mL    Comment:        PERSISTENTLY INCREASED TROPONIN VALUES IN THE RANGE OF 0.04-0.49 ng/mL CAN BE SEEN IN:       -UNSTABLE ANGINA       -CONGESTIVE HEART FAILURE       -MYOCARDITIS       -CHEST TRAUMA       -ARRYHTHMIAS       -LATE PRESENTING MYOCARDIAL INFARCTION       -COPD   CLINICAL FOLLOW-UP RECOMMENDED.   CBC WITH DIFFERENTIAL     Status: Abnormal   Collection Time: 06/09/15 12:52 AM  Result Value Ref Range   WBC 7.5  4.0 - 10.5 K/uL   RBC 3.93 (L) 4.22 - 5.81 MIL/uL   Hemoglobin 12.5 (L) 13.0 - 17.0 g/dL   HCT 36.6 (L) 39.0 - 52.0 %   MCV 93.1 78.0 - 100.0 fL   MCH 31.8 26.0 - 34.0 pg   MCHC 34.2 30.0 - 36.0 g/dL   RDW 14.2 11.5 - 15.5 %   Platelets 217 150 - 400 K/uL   Neutrophils Relative % 58 43 - 77 %   Neutro Abs 4.4 1.7 - 7.7 K/uL   Lymphocytes Relative 30 12 - 46 %   Lymphs Abs 2.3 0.7 - 4.0 K/uL   Monocytes Relative 8 3 - 12 %   Monocytes Absolute 0.6 0.1 - 1.0 K/uL   Eosinophils Relative 3 0 - 5 %   Eosinophils Absolute 0.2 0.0 - 0.7 K/uL   Basophils Relative 1 0 - 1 %   Basophils Absolute 0.0 0.0 - 0.1 K/uL  Protime-INR     Status: Abnormal   Collection Time: 06/09/15 12:52 AM  Result Value Ref Range   Prothrombin Time 23.9 (H) 11.6 - 15.2 seconds   INR 2.16 (H) 0.00 - 1.49  APTT     Status: None   Collection Time: 06/09/15 12:52 AM  Result Value Ref Range   aPTT 37 24 - 37 seconds    Comment:        IF BASELINE aPTT IS  ELEVATED, SUGGEST PATIENT RISK ASSESSMENT BE USED TO DETERMINE APPROPRIATE ANTICOAGULANT THERAPY.   TSH     Status: None   Collection Time: 06/09/15 12:53 AM  Result Value Ref Range   TSH 2.490 0.350 - 4.500 uIU/mL  Lipid panel     Status: Abnormal   Collection Time: 06/09/15  2:31 AM  Result Value Ref Range   Cholesterol 187 0 - 200 mg/dL   Triglycerides 190 (H) <150 mg/dL   HDL 30 (L) >40 mg/dL   Total CHOL/HDL Ratio 6.2 RATIO   VLDL 38 0 - 40 mg/dL   LDL Cholesterol 119 (H) 0 - 99 mg/dL    Comment:        Total Cholesterol/HDL:CHD Risk Coronary Heart Disease Risk Table                     Men   Women  1/2 Average Risk   3.4   3.3  Average Risk       5.0   4.4  2 X Average Risk   9.6   7.1  3 X Average Risk  23.4   11.0        Use the calculated Patient Ratio above and the CHD Risk Table to determine the patient's CHD Risk.        ATP III CLASSIFICATION (LDL):  <100     mg/dL   Optimal  100-129  mg/dL   Near or Above                    Optimal  130-159  mg/dL   Borderline  160-189  mg/dL   High  >190     mg/dL   Very High   Troponin I     Status: Abnormal   Collection Time: 06/09/15  5:35 AM  Result Value Ref Range   Troponin I 0.32 (H) <0.031 ng/mL    Comment:        PERSISTENTLY INCREASED TROPONIN VALUES IN THE RANGE OF 0.04-0.49 ng/mL CAN BE SEEN IN:       -  UNSTABLE ANGINA       -CONGESTIVE HEART FAILURE       -MYOCARDITIS       -CHEST TRAUMA       -ARRYHTHMIAS       -LATE PRESENTING MYOCARDIAL INFARCTION       -COPD   CLINICAL FOLLOW-UP RECOMMENDED.   Basic metabolic panel     Status: Abnormal   Collection Time: 06/09/15  5:35 AM  Result Value Ref Range   Sodium 134 (L) 135 - 145 mmol/L   Potassium 4.2 3.5 - 5.1 mmol/L   Chloride 100 (L) 101 - 111 mmol/L   CO2 26 22 - 32 mmol/L   Glucose, Bld 106 (H) 65 - 99 mg/dL   BUN 26 (H) 6 - 20 mg/dL   Creatinine, Ser 2.05 (H) 0.61 - 1.24 mg/dL   Calcium 9.0 8.9 - 10.3 mg/dL   GFR calc non Af Amer 31 (L)  >60 mL/min   GFR calc Af Amer 36 (L) >60 mL/min    Comment: (NOTE) The eGFR has been calculated using the CKD EPI equation. This calculation has not been validated in all clinical situations. eGFR's persistently <60 mL/min signify possible Chronic Kidney Disease.    Anion gap 8 5 - 15  CBC     Status: Abnormal   Collection Time: 06/09/15  5:35 AM  Result Value Ref Range   WBC 7.0 4.0 - 10.5 K/uL   RBC 3.81 (L) 4.22 - 5.81 MIL/uL   Hemoglobin 12.0 (L) 13.0 - 17.0 g/dL   HCT 35.6 (L) 39.0 - 52.0 %   MCV 93.4 78.0 - 100.0 fL   MCH 31.5 26.0 - 34.0 pg   MCHC 33.7 30.0 - 36.0 g/dL   RDW 14.3 11.5 - 15.5 %   Platelets 211 150 - 400 K/uL  Protime-INR     Status: Abnormal   Collection Time: 06/09/15  5:35 AM  Result Value Ref Range   Prothrombin Time 24.6 (H) 11.6 - 15.2 seconds   INR 2.25 (H) 0.00 - 1.49    Dg Chest Port 1 View  06/08/2015   CLINICAL DATA:  Tachycardia. History of hypertension, diabetes, COPD and sleep apnea. Smoking history.  EXAM: PORTABLE CHEST - 1 VIEW  COMPARISON:  01/06/2014  FINDINGS: Patient is post median sternotomy. Increase cardiomegaly from prior. Vascular congestion and mild peribronchial cuffing, may reflect pulmonary edema. No large pleural effusion. No confluent airspace disease. No pneumothorax.  IMPRESSION: Increase cardiomegaly with vascular congestion. Peribronchial cuffing, may reflect mild edema. Correlation for CHF recommended.   Electronically Signed   By: Jeb Levering M.D.   On: 06/08/2015 22:50    Review of Systems  Constitutional: Negative for fever and diaphoresis.  HENT: Negative for congestion and sore throat.   Respiratory: Positive for shortness of breath (When ambulating). Negative for cough.   Cardiovascular: Positive for chest pain (Resolved), palpitations and claudication. Negative for orthopnea, leg swelling and PND.  Gastrointestinal: Negative for nausea, vomiting, abdominal pain, blood in stool and melena.  Neurological:  Negative for dizziness.   Blood pressure 158/58, pulse 61, temperature 98.2 F (36.8 C), temperature source Oral, resp. rate 14, height 5' 8" (1.727 m), weight 227 lb 4.7 oz (103.1 kg), SpO2 96 %. Physical Exam  Nursing note and vitals reviewed. Constitutional: He is oriented to person, place, and time. He appears well-developed and well-nourished. No distress.  HENT:  Head: Normocephalic and atraumatic.  Eyes: EOM are normal. Pupils are equal, round, and reactive to light.  Neck: Normal range of motion. Neck supple.  Cardiovascular: Normal rate, S1 normal and S2 normal.  An irregularly irregular rhythm present.  No murmur heard. Pulses:      Radial pulses are 2+ on the right side, and 2+ on the left side.       Dorsalis pedis pulses are 0 on the right side, and 2+ on the left side.  Mech valve clicks  Both feet are warm  Respiratory: Effort normal. He has wheezes.  GI: Soft. Bowel sounds are normal. He exhibits no distension. There is no tenderness.  Musculoskeletal: He exhibits no edema.  Lymphadenopathy:    He has no cervical adenopathy.  Neurological: He is alert and oriented to person, place, and time. He exhibits normal muscle tone.  Skin: Skin is warm and dry.  Psychiatric: He has a normal mood and affect.    Assessment/Plan:  1. Wide complex tachycardia:  He spontaneously converted to atrial fibrillation with CVR while in the ambulance.  Unfortunately we have no EKG strips from EMS at the moment.  I have spoken to an EMT who was in the CCU and their main office will fax them over. Presenting arrhthymia felt to be VT (regular and QRS morphology completely different from baseline QRS). QTc also slightly prolonged on admit at 502 mc. Celexa was held. K stable at 4.2. Mg t 2.0. TSH normal. Cardiac enzymes also elevated at 0.22-->0.32 [facility] demand ischemia from tachycardia vs ACS).  On IV amiodarone.  Telemetry shows controlled afib rate.    Echo pending  MD opininon to  follow    2. Elevated Troponin: Cardiac enzymes elevated at 0.22-->0.32.  Likely demand ischemia from tachycardia . He has h/o CAD and known occlusion of all VG and had a low risk NST in 2012.   He reports only SOB with exertion.  No CP.  3. Chronic Afib: rate controlled. INR therapeutic 2.25  4. CKD: stage III.  Stable  5. Dyslipidemia: Onstatin/fibrate  6. Ischemic DCM: Echo pending  HAGER, [place], [place] 06/09/2015, 10:51 AM     EP Attending  Patient seen and examined. I have reviewed his records and concur with the findings of Tarri Fuller, PA-C. The patient presented with VT. He was symptomatic but no unstable. His VT stopped spontaneously. His EF is 40%. He has not had syncope. I have discussed the treatment options with the patient and his son. He has multiple comorbidities and I do not think that he is a good candidate for ICD implant. I have recommended treatment with oral amiodarone. He should remain in the hospital until Wednesday but could be discharged if no more VT. He is concerned about the effect of amiodarone on his kidney function. I have re-assured him that amiodarone is cleared in the liver and does not cause kidney dysfunction. With his severe peripheral vascular disease, VT ablation would also be problematic.   Gregg Taylor,M.D.    Mikle Bosworth.D.

## 2015-06-09 NOTE — Progress Notes (Signed)
ANTICOAGULATION CONSULT NOTE - Follow Up Consult  Pharmacy Consult for Coumadin Indication: atrial fibrillation and St Jude AVR  Allergies  Allergen Reactions  . Chantix [Varenicline] Nausea And Vomiting  . Lyrica [Pregabalin]     Swelling, couldn't breathe  . Nsaids Other (See Comments)    Unknown   . Simvastatin     Pt said he had a "bleed out" after taking it  . Ziac [Bisoprolol-Hydrochlorothiazide]     fatigue    Patient Measurements: Height: 5\' 8"  (172.7 cm) Weight: 227 lb 4.7 oz (103.1 kg) IBW/kg (Calculated) : 68.4  Vital Signs: Temp: 98.2 F (36.8 C) (08/22 0800) Temp Source: Oral (08/22 0800) BP: 142/54 mmHg (08/22 0800) Pulse Rate: 63 (08/22 0800)  Labs:  Recent Labs  06/08/15 2205 06/09/15 0052 06/09/15 0535  HGB 12.3* 12.5* 12.0*  HCT 36.8* 36.6* 35.6*  PLT 219 217 211  APTT  --  37  --   LABPROT 22.7* 23.9* 24.6*  INR 2.02* 2.16* 2.25*  CREATININE 2.05* 2.12* 2.05*  TROPONINI  --  0.22* 0.32*    Estimated Creatinine Clearance: 37.9 mL/min (by C-G formula based on Cr of 2.05).  Assessment: 74yom on coumadin pta for afib and St Jude AVR, admitted with wide complex tachycardia (most likely VT) that converted to afib. He was started on IV amiodarone. INR on admit was below goal at 2.02 and 5mg  given. INR today remains below goal but is trending up at 2.25. Not increasing coumadin dosing due to addition of amiodarone.   Home dose: 5mg  daily except 2.5mg  on Monday and Friday  Goal of Therapy:  INR 2.5-3.5 Monitor platelets by anticoagulation protocol: Yes   Plan:  1) Coumadin 2.5mg  x 1 2) Daily INR 3) Watch drug interaction with amiodarone  Deboraha Sprang 06/09/2015,8:50 AM

## 2015-06-09 NOTE — Progress Notes (Signed)
[facility] faxed over pts EKG strip from 06/08/15. Placed in pts chart at this time.

## 2015-06-10 ENCOUNTER — Inpatient Hospital Stay (HOSPITAL_BASED_OUTPATIENT_CLINIC_OR_DEPARTMENT_OTHER): Payer: PPO

## 2015-06-10 DIAGNOSIS — R7989 Other specified abnormal findings of blood chemistry: Secondary | ICD-10-CM

## 2015-06-10 DIAGNOSIS — I255 Ischemic cardiomyopathy: Secondary | ICD-10-CM

## 2015-06-10 DIAGNOSIS — I209 Angina pectoris, unspecified: Secondary | ICD-10-CM

## 2015-06-10 LAB — PROTIME-INR
INR: 2.22 — ABNORMAL HIGH (ref 0.00–1.49)
Prothrombin Time: 24.4 seconds — ABNORMAL HIGH (ref 11.6–15.2)

## 2015-06-10 LAB — HEMOGLOBIN A1C
HEMOGLOBIN A1C: 6 % — AB (ref 4.8–5.6)
Mean Plasma Glucose: 126 mg/dL

## 2015-06-10 MED ORDER — WARFARIN SODIUM 5 MG PO TABS
5.0000 mg | ORAL_TABLET | Freq: Once | ORAL | Status: AC
  Administered 2015-06-10: 5 mg via ORAL
  Filled 2015-06-10: qty 1

## 2015-06-10 NOTE — Progress Notes (Signed)
Pt not ready for cpap at this time, RT will place on machine when pt is ready

## 2015-06-10 NOTE — Progress Notes (Signed)
ANTICOAGULATION CONSULT NOTE - Follow Up Consult  Pharmacy Consult for Coumadin Indication: atrial fibrillation and St Jude AVR  Allergies  Allergen Reactions  . Chantix [Varenicline] Nausea And Vomiting  . Lyrica [Pregabalin]     Swelling, couldn't breathe  . Nsaids Other (See Comments)    Unknown   . Simvastatin     Pt said he had a "bleed out" after taking it  . Ziac [Bisoprolol-Hydrochlorothiazide]     fatigue    Patient Measurements: Height: 5\' 8"  (172.7 cm) Weight: 227 lb 4.7 oz (103.1 kg) IBW/kg (Calculated) : 68.4  Vital Signs: Temp: 97.5 F (36.4 C) (08/23 0700) Temp Source: Oral (08/23 0700) BP: 138/62 mmHg (08/23 0603) Pulse Rate: 66 (08/23 0603)  Labs:  Recent Labs  06/08/15 2205 06/09/15 0052 06/09/15 0535 06/09/15 1232 06/10/15 0238  HGB 12.3* 12.5* 12.0*  --   --   HCT 36.8* 36.6* 35.6*  --   --   PLT 219 217 211  --   --   APTT  --  37  --   --   --   LABPROT 22.7* 23.9* 24.6*  --  24.4*  INR 2.02* 2.16* 2.25*  --  2.22*  CREATININE 2.05* 2.12* 2.05*  --   --   TROPONINI  --  0.22* 0.32* 0.18*  --     Estimated Creatinine Clearance: 37.9 mL/min (by C-G formula based on Cr of 2.05).  Assessment: 74yom on coumadin pta for afib and St Jude AVR, admitted with wide complex tachycardia (most likely VT) that converted to afib. He was started on IV amiodarone, now on PO 200 mg BID. INR on admit was below goal at 2.02 and 5mg  given.   INR today subtherapeutic at 2.2. CBC stable. Will continue home dose with no changes for now as INR likely to rise given amiodarone interaction.  Home dose: 5mg  daily except 2.5mg  on Monday and Friday  Goal of Therapy:  INR 2.5-3.5 Monitor platelets by anticoagulation protocol: Yes   Plan:  Coumadin 5mg  x 1 Daily INR Watch drug interaction with amiodarone Monitor CBC, s/sx bleeding   Heloise Ochoa, Pharm.D. PGY2 Cardiology Pharmacy Resident Pager: [phone] 06/10/2015,10:00 AM

## 2015-06-10 NOTE — Progress Notes (Signed)
Placed pt on cpap and tolerating well at this time

## 2015-06-10 NOTE — Progress Notes (Signed)
Patient Profile: 74 year old male with a prior history of CKD stage III, PAD, CAD (s/p CABG in 2000, all VG occluded by cath 2010, low risk nuc 2012), s/p mechanical St. Jude AVR in 2000, LV dysfunction EF 40-45% in 2014, OSA, COPD, permanent AF and h/o flutter (s/p DCCV in 2012 but now chronic AF).Also with PVD. Admitted 06/08/15 for symptomatic WCT worrisome for VT.   Subjective: No complaints. Denies any recurrent symptoms overnight. Currently CP free.   Objective: Vital signs in last 24 hours: Temp:  [96.9 F (36.1 C)-97.9 F (36.6 C)] 97.5 F (36.4 C) (08/23 0700) Pulse Rate:  [50-70] 66 (08/23 0603) Resp:  [12-17] 16 (08/23 0603) BP: (105-153)/(43-104) 138/62 mmHg (08/23 0603) SpO2:  [91 %-99 %] 96 % (08/23 0603) FiO2 (%):  [21 %] 21 % (08/22 2031) Last BM Date: 06/08/15  Intake/Output from previous day: 08/22 0701 - 08/23 0700 In: 407 [P.O.:240; I.V.:167] Out: 2100 [Urine:2100] Intake/Output this shift:    Medications . allopurinol  300 mg Oral Daily  . amiodarone  200 mg Oral BID  . aspirin EC  81 mg Oral Daily  . famotidine  40 mg Oral Daily  . fenofibrate  160 mg Oral Daily  . ferrous sulfate  325 mg Oral Q breakfast  . isosorbide mononitrate  30 mg Oral Daily  . levothyroxine  50 mcg Oral QAC breakfast  . pravastatin  40 mg Oral q1800  . sodium chloride  3 mL Intravenous Q12H  . spironolactone  25 mg Oral Daily  . warfarin  5 mg Oral ONCE-1800  . Warfarin - Pharmacist Dosing Inpatient   Does not apply q1800   PE: General appearance: alert, cooperative and no distress Neck: no carotid bruit and no JVD Lungs: clear to auscultation bilaterally Heart: irregularly irregular rhythm and regular rate Extremities: no LEE Pulses: 2+ and symmetric Skin: warm and dry Neurologic: Grossly normal  Lab Results:   Recent Labs  06/08/15 2205 06/09/15 0052 06/09/15 0535  WBC 6.0 7.5 7.0  HGB 12.3* 12.5* 12.0*  HCT 36.8* 36.6* 35.6*  PLT 219 217 211    BMET  Recent Labs  06/08/15 2205 06/09/15 0052 06/09/15 0535  NA 132* 133* 134*  K 4.3 4.6 4.2  CL 98* 99* 100*  CO2 25 25 26   GLUCOSE 118* 78 106*  BUN 26* 27* 26*  CREATININE 2.05* 2.12* 2.05*  CALCIUM 9.2 9.3 9.0   PT/INR  Recent Labs  06/09/15 0052 06/09/15 0535 06/10/15 0238  LABPROT 23.9* 24.6* 24.4*  INR 2.16* 2.25* 2.22*   Cholesterol  Recent Labs  06/09/15 0231  CHOL 187   Cardiac Panel (last 3 results)  Recent Labs  06/09/15 0052 06/09/15 0535 06/09/15 1232  TROPONINI 0.22* 0.32* 0.18*    Studies/Results: 2D echo pending   Assessment/Plan  Active Problems:   Chest pain  1. Wide complex tachycardia: spontaneously converted to atrial fibrillation with CVR.Presenting arrhthymia felt to be VT (regular and QRS morphology completely different from baseline QRS). QTc also slightly prolonged on admit at 502 mc. Celexa now on hold. K stable at 4.2. Mg WNL at 2.0. TSH also normal. Cardiac enzymes also elevated at 0.22-->0.32 (? Demand ischemia from tachycardia vs ACS). Will need additional w/u. 2D echo pending. No further arrhthymias on IV amiodarone. Will continue IV amiodarone. INR is therapeutic at 2.25.   2. Elevated Troponin: Cardiac enzymes elevated at 0.22-->0.32 (? Demand ischemia from tachycardia vs ACS). He has h/o CAD and known occlusion of all  VG and had a low risk NST in 2012. Given underlying CKD with Scr ~2, would recommend repeat NST to r/o ischemia before cath.   3. Chronic Afib: rate controlled. INR therapeutic.   4. CKD: stage III  5. Dyslipidemia: continue statin/fibrate  6.  Ischemic DCM:  last EF assessment 40-45% - recheck echo  74 year old male with h/o CAD, CABG, AVR admitted with wide complex tachycardia with ventricular rate 150 BPM that cardioverted into a-fib on amiodarone drip. ECG strips from EMS are not available. 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   We will continue amiodarone load, transition to PO 400 mg po BID once loading completed. The patient was seen by Dr Lovena Le yesterday.   He was symptomatic but no unstable. His VT stopped spontaneously. His EF is 40%. He has not had syncope. I have discussed the treatment options with the patient and his son. He has multiple comorbidities and I do not think that he is a good candidate for ICD implant. I have recommended treatment with oral amiodarone. He should remain in the hospital until Wednesday but could be discharged if no more VT. He is concerned about the effect of amiodarone on his kidney function. I have re-assured him that amiodarone is cleared in the liver and does not cause kidney dysfunction. With his severe peripheral vascular disease, VT ablation would also be problematic.   We will monitor for one more day as he continues to have PVCs and nsVTs. Anticipated discharge tomorrow.He can be transferred to telemetry.  Dorothy Spark 06/10/2015

## 2015-06-11 ENCOUNTER — Encounter (HOSPITAL_COMMUNITY): Payer: Self-pay | Admitting: Physician Assistant

## 2015-06-11 ENCOUNTER — Other Ambulatory Visit: Payer: Self-pay | Admitting: Internal Medicine

## 2015-06-11 DIAGNOSIS — I472 Ventricular tachycardia, unspecified: Secondary | ICD-10-CM

## 2015-06-11 DIAGNOSIS — I4729 Other ventricular tachycardia: Secondary | ICD-10-CM

## 2015-06-11 LAB — PROTIME-INR
INR: 2.15 — ABNORMAL HIGH (ref 0.00–1.49)
Prothrombin Time: 23.8 seconds — ABNORMAL HIGH (ref 11.6–15.2)

## 2015-06-11 MED ORDER — BUMETANIDE 2 MG PO TABS: ORAL_TABLET | ORAL | Status: DC

## 2015-06-11 MED ORDER — WARFARIN SODIUM 5 MG PO TABS: 5.0000 mg | ORAL_TABLET | Freq: Once | ORAL | Status: DC

## 2015-06-11 MED ORDER — ISOSORBIDE MONONITRATE ER 30 MG PO TB24: 30.0000 mg | ORAL_TABLET | Freq: Every day | ORAL | Status: DC

## 2015-06-11 MED ORDER — WARFARIN SODIUM 5 MG PO TABS: 2.5000 mg | ORAL_TABLET | Freq: Every day | ORAL | Status: DC

## 2015-06-11 MED ORDER — AMIODARONE HCL 200 MG PO TABS: 200.0000 mg | ORAL_TABLET | Freq: Two times a day (BID) | ORAL | Status: DC

## 2015-06-11 NOTE — Progress Notes (Signed)
ANTICOAGULATION CONSULT NOTE - Follow Up Consult  Pharmacy Consult for Coumadin Indication: atrial fibrillation and St Jude AVR  Allergies  Allergen Reactions  . Chantix [Varenicline] Nausea And Vomiting  . Lyrica [Pregabalin]     Swelling, couldn't breathe  . Nsaids Other (See Comments)    Unknown   . Simvastatin     Pt said he had a "bleed out" after taking it  . Ziac [Bisoprolol-Hydrochlorothiazide]     fatigue    Patient Measurements: Height: 5\' 8"  (172.7 cm) Weight: 227 lb 4.7 oz (103.1 kg) IBW/kg (Calculated) : 68.4  Vital Signs: Temp: 97.7 F (36.5 C) (08/24 0506) Temp Source: Oral (08/24 0506) BP: 142/65 mmHg (08/24 0506) Pulse Rate: 66 (08/24 0506)  Labs:  Recent Labs  06/08/15 2205 06/09/15 0052 06/09/15 0535 06/09/15 1232 06/10/15 0238 06/11/15 0449  HGB 12.3* 12.5* 12.0*  --   --   --   HCT 36.8* 36.6* 35.6*  --   --   --   PLT 219 217 211  --   --   --   APTT  --  37  --   --   --   --   LABPROT 22.7* 23.9* 24.6*  --  24.4* 23.8*  INR 2.02* 2.16* 2.25*  --  2.22* 2.15*  CREATININE 2.05* 2.12* 2.05*  --   --   --   TROPONINI  --  0.22* 0.32* 0.18*  --   --     Estimated Creatinine Clearance: 37.9 mL/min (by C-G formula based on Cr of 2.05).  Assessment: 74yom on coumadin pta for afib and St Jude AVR, admitted with wide complex tachycardia (most likely VT) that converted to afib. He was started on IV amiodarone, now on PO 200 mg BID. INR on admit was below goal at 2.02 and 5mg  given.   INR today subtherapeutic at 2.15. CBC stable. Will continue home dose with no changes for now as INR likely to rise given amiodarone interaction.  Home dose: 5mg  daily except 2.5mg  on Monday and Friday  Goal of Therapy:  INR 2.5-3.5 Monitor platelets by anticoagulation protocol: Yes   Plan:  Coumadin 5mg  x 1 Daily INR Watch drug interaction with amiodarone Monitor CBC, s/sx bleeding   Eudelia Bunch, Pharm.D. [phone] 06/11/2015 11:00 AM

## 2015-06-11 NOTE — Progress Notes (Signed)
Patient and son received discharge instructions and education, IV dc'd, Telemetry dc'd. Vitals stable for pt. Patient discharge to vehicle with family via w/c. 06/11/2015 2:15 PM Robert Stanton

## 2015-06-11 NOTE — Discharge Summary (Signed)
CARDIOLOGY DISCHARGE SUMMARY   Patient ID: Robert Stanton MRN: [id number] DOB/AGE: January 29, 1941 74 y.o.  Admit date: 06/08/2015 Discharge date: 06/12/2015  PCP: Alesia Richards, MD Primary Cardiologist: Dr [name]  Primary Discharge Diagnosis:   Ventricular tachycardia (paroxysmal)  Secondary Discharge Diagnosis:    Chest pain  Consults: EP   Procedures:   Hospital Course: Robert Stanton is a 74 y.o. male with a history of CKD stage III, PAD, CAD (s/p CABG in 2000, all VG occluded by cath 2010, low risk nuc 2012), s/p mechanical St. Jude AVR in 2000, LV dysfunction EF 40-45% in 2014, OSA, COPD, permanent AF and h/o flutter (s/p DCCV in 2012 but now chronic AF).Also with PVD.   He developed palpitations, noted his heart rate to be a size 167 on a home machine. He developed chest pain with this. He called EMS and was in a wide complex tachycardia that spontaneously converted to his usual A. fib en route to the hospital. He was admitted 06/08/15 for symptomatic WCT worrisome for VT and chest pain.  Cardiac enzymes were mildly elevated, but this was felt secondary to the tachycardia. Once the arrhythmia improved, his chest pain resolved. His cardiac status is well-known. He also has chronic kidney disease stage III-4. Since his chest pain resolved and his enzymes were only minimally elevated, is planned.   An EP consult was called and he was seen by Dr. Lovena Le. He had been started on IV amiodarone on admission but it was not clear that this would be his best medication going forward. A TSH was within normal limits and LFTs were okay. He had no significant electrolyte abnormalities, specifically his potassium was greater than 4 and his magnesium was 2.0. The arrhythmia was felt to be ventricular tachycardia. Amiodarone was felt to be his best option and he was switched to the oral formulation. On the oral formulation, his ventricular ectopy significantly decreased.  His atrial  fibrillation is chronic but he has slow ventricular response at times. Therefore, a beta blocker was not indicated. Monitoring was continued with the amiodarone loading. His Coumadin was also monitored and was therapeutic at discharge. His family physician enters this and he is to keep his appointment next week for Coumadin check.  On 06/11/2015, he was seen by Dr. Meda Coffee and all data were reviewed. A taper for his amiodarone load was set up. He was not having any additional long runs of VT. An early follow-up was arranged. No further inpatient workup was indicated and he is considered stable for discharge, to follow up as an outpatient.    Labs:   Lab Results  Component Value Date   WBC 7.0 06/09/2015   HGB 12.0* 06/09/2015   HCT 35.6* 06/09/2015   MCV 93.4 06/09/2015   PLT 211 06/09/2015     Recent Labs Lab 06/09/15 0052 06/09/15 0535  NA 133* 134*  K 4.6 4.2  CL 99* 100*  CO2 25 26  BUN 27* 26*  CREATININE 2.12* 2.05*  CALCIUM 9.3 9.0  PROT 6.9  --   BILITOT 0.5  --   ALKPHOS 51  --   ALT 16*  --   AST 25  --   GLUCOSE 78 106*    Recent Labs  06/09/15 1232  TROPONINI 0.18*   Lipid Panel     Component Value Date/Time   CHOL 187 06/09/2015 0231   TRIG 190* 06/09/2015 0231   HDL 30* 06/09/2015 0231   CHOLHDL 6.2 06/09/2015 0231  VLDL 38 06/09/2015 0231   LDLCALC 119* 06/09/2015 0231    Recent Labs  06/11/15 0449  INR 2.15*      Radiology: Dg Chest Port 1 View 06/08/2015   CLINICAL DATA:  Tachycardia. History of hypertension, diabetes, COPD and sleep apnea. Smoking history.  EXAM: PORTABLE CHEST - 1 VIEW  COMPARISON:  01/06/2014  FINDINGS: Patient is post median sternotomy. Increase cardiomegaly from prior. Vascular congestion and mild peribronchial cuffing, may reflect pulmonary edema. No large pleural effusion. No confluent airspace disease. No pneumothorax.  IMPRESSION: Increase cardiomegaly with vascular congestion. Peribronchial cuffing, may reflect mild  edema. Correlation for CHF recommended.   Electronically Signed   By: Jeb Levering M.D.   On: 06/08/2015 22:50   EKG:06/10/2015 Atrial fibrillation Rate 62, no acute ischemic changes   FOLLOW UP PLANS AND APPOINTMENTS Allergies  Allergen Reactions  . Chantix [Varenicline] Nausea And Vomiting  . Lyrica [Pregabalin]     Swelling, couldn't breathe  . Nsaids Other (See Comments)    Unknown   . Simvastatin     Pt said he had a "bleed out" after taking it  . Ziac [Bisoprolol-Hydrochlorothiazide]     fatigue     Medication List    STOP taking these medications        verapamil 80 MG tablet  Commonly known as:  CALAN      TAKE these medications        albuterol (2.5 MG/3ML) 0.083% nebulizer solution  Commonly known as:  PROVENTIL  Take 2.5 mg by nebulization every 6 (six) hours as needed for wheezing or shortness of breath.     allopurinol 300 MG tablet  Commonly known as:  ZYLOPRIM  TAKE 1 TABLET (300 MG TOTAL) BY MOUTH DAILY. TO PREVENT GOUT     amiodarone 200 MG tablet  Commonly known as:  PACERONE  Take 1-2 tablets (200-400 mg total) by mouth 2 (two) times daily. Take 2 tabs bid x 5 days, then 1 tab bid x 5 days then 1 tab daily     aspirin 81 MG tablet  Take 81 mg by mouth daily.     bumetanide 2 MG tablet  Commonly known as:  BUMEX  Take 1/2 tablet daily as needed for swelling     citalopram 40 MG tablet  Commonly known as:  CELEXA  TAKE 1 TABLET BY MOUTH EVERY DAY     fenofibrate 145 MG tablet  Commonly known as:  TRICOR  Take 1 tablet daily for Blood Fats     ferrous sulfate 325 (65 FE) MG tablet  Take 325 mg by mouth daily.     ipratropium-albuterol 0.5-2.5 (3) MG/3ML Soln  Commonly known as:  DUONEB  Inhale 3 mLs into the lungs every 6 (six) hours as needed (shortness of breath).     isosorbide mononitrate 30 MG 24 hr tablet  Commonly known as:  IMDUR  TAKE 1 TABLET BY MOUTH EVERY DAY     levothyroxine 50 MCG tablet  Commonly known as:   SYNTHROID, LEVOTHROID  TAKE 1 TABLET BY MOUTH EVERY DAY     loratadine 10 MG tablet  Commonly known as:  CLARITIN  Take 10 mg by mouth daily.     nitroGLYCERIN 0.4 MG SL tablet  Commonly known as:  NITROSTAT  Place 1 tablet (0.4 mg total) under the tongue every 5 (five) minutes as needed for chest pain.     pravastatin 40 MG tablet  Commonly known as:  PRAVACHOL  TAKE  1 TABLET (40 MG TOTAL) BY MOUTH EVERY MORNING.     ranitidine 300 MG tablet  Commonly known as:  ZANTAC  TAKE 1 TABLET (300 MG TOTAL) BY MOUTH AT BEDTIME.     spironolactone 25 MG tablet  Commonly known as:  ALDACTONE  Take 1 tablet (25 mg total) by mouth daily.     tiotropium 18 MCG inhalation capsule  Commonly known as:  SPIRIVA  Place 18 mcg into inhaler and inhale daily as needed (for shortness of breath).     VITAMIN B 12 PO  Take 1,500 mcg by mouth daily.     VITAMIN D PO  Take 5,000 Units by mouth daily.     warfarin 5 MG tablet  Commonly known as:  COUMADIN  Take 0.5-1 tablets (2.5-5 mg total) by mouth daily. Take 2.5mg  on Monday and Friday.  5mg  all other days of the week.        Discharge Instructions    (HEART FAILURE PATIENTS) Call MD:  Anytime you have any of the following symptoms: 1) 3 pound weight gain in 24 hours or 5 pounds in 1 week 2) shortness of breath, with or without a dry hacking cough 3) swelling in the hands, feet or stomach 4) if you have to sleep on extra pillows at night in order to breathe.    Complete by:  As directed      Diet - low sodium heart healthy    Complete by:  As directed      Increase activity slowly    Complete by:  As directed           Follow-up Information    Follow up with HAGER, BRYAN, PA-C On 06/24/2015.   Specialties:  Physician Assistant, Radiology, Interventional Cardiology   Why:  See provider at 8:30 am, please arrive 15 minutes early for paperwork.   Contact information:   [address] [place] [address] [phone]        BRING ALL MEDICATIONS WITH YOU TO FOLLOW UP APPOINTMENTS  Time spent with patient to include physician time: 49 min Signed: Rosaria Ferries, PA-C 06/12/2015, 12:23 PM Co-Sign MD

## 2015-06-11 NOTE — Progress Notes (Signed)
Patient Name: Robert Stanton Date of Encounter: 06/11/2015  Principal Problem:   Ventricular tachycardia (paroxysmal) Active Problems:   Chest pain   Primary Cardiologist: Dr [name]  Patient Profile: 74 year old male with a prior history of CKD stage III, PAD, CAD (s/p CABG in 2000, all VG occluded by cath 2010, low risk nuc 2012), s/p mechanical [employer] AVR in 2000, LV dysfunction EF 40-45% in 2014, OSA, COPD, permanent AF and h/o flutter (s/p DCCV in 2012 but now chronic AF).Also with PVD. Admitted 06/08/15 for symptomatic WCT worrisome for VT. Seen by GT and felt it was VT, recommended oral amio, no ICD.  SUBJECTIVE: Feels better today, no chest pain, no palpitations.  OBJECTIVE Filed Vitals:   06/10/15 1835 06/10/15 1844 06/10/15 2108 06/11/15 0506  BP:  147/63 139/59 142/65  Pulse: 73 63 58 66  Temp:  98.1 F (36.7 C) 98.4 F (36.9 C) 97.7 F (36.5 C)  TempSrc:  Oral Oral Oral  Resp: 20   18  Height:      Weight:      SpO2: 97% 99% 96% 98%    Intake/Output Summary (Last 24 hours) at 06/11/15 1038 Last data filed at 06/11/15 0759  Gross per 24 hour  Intake    600 ml  Output   1635 ml  Net  -1035 ml   Filed Weights   06/08/15 2238 06/09/15 0000  Weight: 225 lb (102.059 kg) 227 lb 4.7 oz (103.1 kg)    PHYSICAL EXAM General: Well developed, well nourished, male in no acute distress. Head: Normocephalic, atraumatic.  Neck: Supple without bruits, JVD not elevated. Lungs:  Resp regular and unlabored, slight 3. Heart: RRR, S1, S2, no S3, S4, or murmur; no rub. Abdomen: Soft, non-tender, non-distended, BS + x 4.  Extremities: No clubbing, cyanosis, edema.  Neuro: Alert and oriented X 3. Moves all extremities spontaneously. Psych: Normal affect.  LABS: CBC:  Recent Labs  06/09/15 0052 06/09/15 0535  WBC 7.5 7.0  NEUTROABS 4.4  --   HGB 12.5* 12.0*  HCT 36.6* 35.6*  MCV 93.1 93.4  PLT 217 211   INR:  Recent Labs  06/11/15 0449  INR 2.15*     Basic Metabolic Panel:  Recent Labs  06/09/15 0052 06/09/15 0535  NA 133* 134*  K 4.6 4.2  CL 99* 100*  CO2 25 26  GLUCOSE 78 106*  BUN 27* 26*  CREATININE 2.12* 2.05*  CALCIUM 9.3 9.0  MG 2.0  --    Liver Function Tests:  Recent Labs  06/09/15 0052  AST 25  ALT 16*  ALKPHOS 51  BILITOT 0.5  PROT 6.9  ALBUMIN 3.9   Cardiac Enzymes:  Recent Labs  06/09/15 0052 06/09/15 0535 06/09/15 1232  TROPONINI 0.22* 0.32* 0.18*    Recent Labs  06/08/15 2219  TROPIPOC 0.02   Hemoglobin A1C:  Recent Labs  06/09/15 0053  HGBA1C 6.0*   Fasting Lipid Panel:  Recent Labs  06/09/15 0231  CHOL 187  HDL 30*  LDLCALC 119*  TRIG 190*  CHOLHDL 6.2   Thyroid Function Tests:  Recent Labs  06/09/15 0053  TSH 2.490   TELE:  Atrial fib, rate generally OK, PVCs and 1 salvo of 3 bts, no longer runs of VT     ECHO:  06/09/2015 - Left ventricle: The cavity size was normal. Wall thickness was increased in a pattern of moderate LVH. Systolic function was mildly to moderately reduced. The estimated ejection fraction was  in the range of 40% to 45%. Regional wall motion abnormalities cannot be excluded. - Aortic valve: There was mild regurgitation. - Mitral valve: Calcified annulus. - Left atrium: The atrium was mildly dilated. - Right atrium: The atrium was mildly dilated.  Current Medications:  . allopurinol  300 mg Oral Daily  . amiodarone  200 mg Oral BID  . aspirin EC  81 mg Oral Daily  . famotidine  40 mg Oral Daily  . fenofibrate  160 mg Oral Daily  . ferrous sulfate  325 mg Oral Q breakfast  . isosorbide mononitrate  30 mg Oral Daily  . levothyroxine  50 mcg Oral QAC breakfast  . pravastatin  40 mg Oral q1800  . sodium chloride  3 mL Intravenous Q12H  . spironolactone  25 mg Oral Daily  . Warfarin - Pharmacist Dosing Inpatient   Does not apply q1800      ASSESSMENT AND PLAN: Principal Problem:   Ventricular tachycardia (paroxysmal) -  med rx w/ amiodarone, TSH and LFTs OK - w/ atrial fib, HR 50s at times so no additional rate-lowering meds.   Active Problems:   Chest pain - Resolved once VT resolved - mild enzyme elevation - EF unchanged, RWMA "cannot be excluded" but are not reported - Nuc stress referenced but not ordered, defer unless additional chest pain - no cath w/ CKD    Chronic anticoagulation - followed by primary MD, coumadin is therapeutic     Plan: consider d/c today unless further ischemic eval needed.  Signed, Lenoard Aden 10:38 AM 06/11/2015  The patient was seen, examined and discussed with Rosaria Ferries, PA-C and I agree with the above.   74 year old male with h/o CAD, CABG, AVR admitted with wide complex tachycardia with ventricular rate 150 BPM that cardioverted into a-fib on amiodarone drip. ECG strips from EMS are not available. His Troponin is elevated in the settings of demand ischemia and CKD stage IV (Crea 2.03), we will continue warfarin, we will trend troponin, no cath for now, we will follow echocardiogram for new wall motion abnormalities.  We will continue amiodarone load, transition to PO 400 mg po BID once loading completed. The patient was seen by Dr Lovena Le yesterday.  He was symptomatic but no unstable. His VT stopped spontaneously. His EF is 40%. He has not had syncope. I have discussed the treatment options with the patient and his son. He has multiple comorbidities and I do not think that he is a good candidate for ICD implant. I have recommended treatment with oral amiodarone. He should remain in the hospital until Wednesday but could be discharged if no more VT. He is concerned about the effect of amiodarone on his kidney function. I have re-assured him that amiodarone is cleared in the liver and does not cause kidney dysfunction. With his severe peripheral vascular disease, VT ablation would also be problematic.   We will monitor for one more day as he continues to  have PVCs and nsVTs. We will increase amiodarone to 400 mg po BID x 5 days, then 200 mg po BID x 5 days, the 200 mg po daily chronic. We will arrange follow up in our clinic in 1-2 weeks.    Dorothy Spark 06/11/2015

## 2015-06-11 NOTE — Care Management Important Message (Signed)
Important Message  Patient Details  Name: Robert Stanton MRN: [id number] Date of Birth: 01/25/1941   Medicare Important Message Given:  [facility] notification given    Nathen May 06/11/2015, 11:36 AMImportant Message  Patient Details  Name: Robert Stanton MRN: [id number] Date of Birth: December 22, 1940   Medicare Important Message Given:  Yes-second notification given    Nathen May 06/11/2015, 11:35 AM

## 2015-06-12 ENCOUNTER — Telehealth: Payer: Self-pay | Admitting: Internal Medicine

## 2015-06-12 ENCOUNTER — Encounter: Payer: Self-pay | Admitting: Physician Assistant

## 2015-06-12 NOTE — Telephone Encounter (Signed)
TCM phone call ...appointment on 06/24/15 at 8:30am w/ Tarri Fuller   Thanks

## 2015-06-13 NOTE — Telephone Encounter (Signed)
Patient contacted regarding discharge from [facility] on 06/12/2015.  Patient understands to follow up with provider Tarri Fuller, PA on 06/24/2015 at 8:30am at [facility]. Patient understands discharge instructions? yes Patient understands medications and regiment? yes Patient understands to bring all medications to this visit? yes

## 2015-06-16 ENCOUNTER — Ambulatory Visit (INDEPENDENT_AMBULATORY_CARE_PROVIDER_SITE_OTHER): Payer: PPO | Admitting: Physician Assistant

## 2015-06-16 ENCOUNTER — Encounter: Payer: Self-pay | Admitting: Physician Assistant

## 2015-06-16 VITALS — BP 132/70 | HR 60 | Temp 98.2°F | Resp 20 | Ht 68.0 in | Wt 224.8 lb

## 2015-06-16 DIAGNOSIS — I25119 Atherosclerotic heart disease of native coronary artery with unspecified angina pectoris: Secondary | ICD-10-CM

## 2015-06-16 DIAGNOSIS — F32A Depression, unspecified: Secondary | ICD-10-CM

## 2015-06-16 DIAGNOSIS — I4821 Permanent atrial fibrillation: Secondary | ICD-10-CM

## 2015-06-16 DIAGNOSIS — F329 Major depressive disorder, single episode, unspecified: Secondary | ICD-10-CM | POA: Diagnosis not present

## 2015-06-16 DIAGNOSIS — I482 Chronic atrial fibrillation: Secondary | ICD-10-CM | POA: Diagnosis not present

## 2015-06-16 DIAGNOSIS — I739 Peripheral vascular disease, unspecified: Secondary | ICD-10-CM

## 2015-06-16 DIAGNOSIS — E039 Hypothyroidism, unspecified: Secondary | ICD-10-CM

## 2015-06-16 DIAGNOSIS — Z720 Tobacco use: Secondary | ICD-10-CM | POA: Diagnosis not present

## 2015-06-16 DIAGNOSIS — F17200 Nicotine dependence, unspecified, uncomplicated: Secondary | ICD-10-CM

## 2015-06-16 DIAGNOSIS — Z79899 Other long term (current) drug therapy: Secondary | ICD-10-CM

## 2015-06-16 LAB — HEPATIC FUNCTION PANEL
ALBUMIN: 4.2 g/dL (ref 3.6–5.1)
ALT: 11 U/L (ref 9–46)
AST: 17 U/L (ref 10–35)
Alkaline Phosphatase: 49 U/L (ref 40–115)
Bilirubin, Direct: 0.1 mg/dL (ref <=0.2)
Indirect Bilirubin: 0.4 mg/dL (ref 0.2–1.2)
TOTAL PROTEIN: 7.1 g/dL (ref 6.1–8.1)
Total Bilirubin: 0.5 mg/dL (ref 0.2–1.2)

## 2015-06-16 LAB — CBC WITH DIFFERENTIAL/PLATELET
Basophils Absolute: 0.1 10*3/uL (ref 0.0–0.1)
Basophils Relative: 1 % (ref 0–1)
EOS PCT: 3 % (ref 0–5)
Eosinophils Absolute: 0.2 10*3/uL (ref 0.0–0.7)
HEMATOCRIT: 37.1 % — AB (ref 39.0–52.0)
HEMOGLOBIN: 12.6 g/dL — AB (ref 13.0–17.0)
LYMPHS ABS: 1.7 10*3/uL (ref 0.7–4.0)
LYMPHS PCT: 24 % (ref 12–46)
MCH: 31.4 pg (ref 26.0–34.0)
MCHC: 34 g/dL (ref 30.0–36.0)
MCV: 92.5 fL (ref 78.0–100.0)
MONO ABS: 0.7 10*3/uL (ref 0.1–1.0)
MONOS PCT: 10 % (ref 3–12)
MPV: 10 fL (ref 8.6–12.4)
Neutro Abs: 4.4 10*3/uL (ref 1.7–7.7)
Neutrophils Relative %: 62 % (ref 43–77)
Platelets: 244 10*3/uL (ref 150–400)
RBC: 4.01 MIL/uL — AB (ref 4.22–5.81)
RDW: 14.9 % (ref 11.5–15.5)
WBC: 7.1 10*3/uL (ref 4.0–10.5)

## 2015-06-16 LAB — BASIC METABOLIC PANEL WITH GFR
BUN: 36 mg/dL — AB (ref 7–25)
CALCIUM: 9 mg/dL (ref 8.6–10.3)
CO2: 27 mmol/L (ref 20–31)
Chloride: 97 mmol/L — ABNORMAL LOW (ref 98–110)
Creat: 2.61 mg/dL — ABNORMAL HIGH (ref 0.70–1.18)
GFR, EST AFRICAN AMERICAN: 27 mL/min — AB (ref >=60)
GFR, Est Non African American: 23 mL/min — ABNORMAL LOW (ref >=60)
GLUCOSE: 126 mg/dL — AB (ref 65–99)
Potassium: 4.4 mmol/L (ref 3.5–5.3)
Sodium: 134 mmol/L — ABNORMAL LOW (ref 135–146)

## 2015-06-16 LAB — PROTIME-INR
INR: 2.73 — AB (ref <1.50)
Prothrombin Time: 28.9 seconds — ABNORMAL HIGH (ref 11.6–15.2)

## 2015-06-16 MED ORDER — SERTRALINE HCL 50 MG PO TABS: 50.0000 mg | ORAL_TABLET | Freq: Every day | ORAL | Status: DC

## 2015-06-16 NOTE — Patient Instructions (Signed)
Smoking Cessation Quitting smoking is important to your health and has many advantages. However, it is not always easy to quit since nicotine is a very addictive drug. Oftentimes, people try 3 times or more before being able to quit. This document explains the best ways for you to prepare to quit smoking. Quitting takes hard work and a lot of effort, but you can do it. ADVANTAGES OF QUITTING SMOKING  You will live longer, feel better, and live better.  Your body will feel the impact of quitting smoking almost immediately.  Within 20 minutes, blood pressure decreases. Your pulse returns to its normal level.  After 8 hours, carbon monoxide levels in the blood return to normal. Your oxygen level increases.  After 24 hours, the chance of having a heart attack starts to decrease. Your breath, hair, and body stop smelling like smoke.  After 48 hours, damaged nerve endings begin to recover. Your sense of taste and smell improve.  After 72 hours, the body is virtually free of nicotine. Your bronchial tubes relax and breathing becomes easier.  After 2 to 12 weeks, lungs can hold more air. Exercise becomes easier and circulation improves.  The risk of having a heart attack, stroke, cancer, or lung disease is greatly reduced.  After 1 year, the risk of coronary heart disease is cut in half.  After 5 years, the risk of stroke falls to the same as a nonsmoker.  After 10 years, the risk of lung cancer is cut in half and the risk of other cancers decreases significantly.  After 15 years, the risk of coronary heart disease drops, usually to the level of a nonsmoker.  If you are pregnant, quitting smoking will improve your chances of having a healthy baby.  The people you live with, especially any children, will be healthier.  You will have extra money to spend on things other than cigarettes. QUESTIONS TO THINK ABOUT BEFORE ATTEMPTING TO QUIT You may want to talk about your answers with your  health care provider.  Why do you want to quit?  If you tried to quit in the past, what helped and what did not?  What will be the most difficult situations for you after you quit? How will you plan to handle them?  Who can help you through the tough times? Your family? Friends? A health care provider?  What pleasures do you get from smoking? What ways can you still get pleasure if you quit? Here are some questions to ask your health care provider:  How can you help me to be successful at quitting?  What medicine do you think would be best for me and how should I take it?  What should I do if I need more help?  What is smoking withdrawal like? How can I get information on withdrawal? GET READY  Set a quit date.  Change your environment by getting rid of all cigarettes, ashtrays, matches, and lighters in your home, car, or work. Do not let people smoke in your home.  Review your past attempts to quit. Think about what worked and what did not. GET SUPPORT AND ENCOURAGEMENT You have a better chance of being successful if you have help. You can get support in many ways.  Tell your family, friends, and coworkers that you are going to quit and need their support. Ask them not to smoke around you.  Get individual, group, or telephone counseling and support. Programs are available at local hospitals and health centers. Call   your local health department for information about programs in your area.  Spiritual beliefs and practices may help some smokers quit.  Download a "quit meter" on your computer to keep track of quit statistics, such as how long you have gone without smoking, cigarettes not smoked, and money saved.  Get a self-help book about quitting smoking and staying off tobacco. Summit yourself from urges to smoke. Talk to someone, go for a walk, or occupy your time with a task.  Change your normal routine. Take a different route to work.  Drink tea instead of coffee. Eat breakfast in a different place.  Reduce your stress. Take a hot bath, exercise, or read a book.  Plan something enjoyable to do every day. Reward yourself for not smoking.  Explore interactive web-based programs that specialize in helping you quit. GET MEDICINE AND USE IT CORRECTLY Medicines can help you stop smoking and decrease the urge to smoke. Combining medicine with the above behavioral methods and support can greatly increase your chances of successfully quitting smoking.  Nicotine replacement therapy helps deliver nicotine to your body without the negative effects and risks of smoking. Nicotine replacement therapy includes nicotine gum, lozenges, inhalers, nasal sprays, and skin patches. Some may be available over-the-counter and others require a prescription.  Antidepressant medicine helps people abstain from smoking, but how this works is unknown. This medicine is available by prescription.  Nicotinic receptor partial agonist medicine simulates the effect of nicotine in your brain. This medicine is available by prescription. Ask your health care provider for advice about which medicines to use and how to use them based on your health history. Your health care provider will tell you what side effects to look out for if you choose to be on a medicine or therapy. Carefully read the information on the package. Do not use any other product containing nicotine while using a nicotine replacement product.  RELAPSE OR DIFFICULT SITUATIONS Most relapses occur within the first 3 months after quitting. Do not be discouraged if you start smoking again. Remember, most people try several times before finally quitting. You may have symptoms of withdrawal because your body is used to nicotine. You may crave cigarettes, be irritable, feel very hungry, cough often, get headaches, or have difficulty concentrating. The withdrawal symptoms are only temporary. They are strongest  when you first quit, but they will go away within 10-14 days. To reduce the chances of relapse, try to:  Avoid drinking alcohol. Drinking lowers your chances of successfully quitting.  Reduce the amount of caffeine you consume. Once you quit smoking, the amount of caffeine in your body increases and can give you symptoms, such as a rapid heartbeat, sweating, and anxiety.  Avoid smokers because they can make you want to smoke.  Do not let weight gain distract you. Many smokers will gain weight when they quit, usually less than 10 pounds. Eat a healthy diet and stay active. You can always lose the weight gained after you quit.  Find ways to improve your mood other than smoking. FOR MORE INFORMATION  www.smokefree.gov  Document Released: 09/28/2001 Document Revised: 02/18/2014 Document Reviewed: 01/13/2012 [facility] Patient Information 2015 [place], [place]. This information is not intended to replace advice given to you by your health care provider. Make sure you discuss any questions you have with your health care provider.   Generalized Anxiety Disorder Generalized anxiety disorder (GAD) is a mental disorder. It interferes with life functions, including relationships, work, and  school. GAD is different from normal anxiety, which everyone experiences at some point in their lives in response to specific life events and activities. Normal anxiety actually helps [place] prepare for and get through these life events and activities. Normal anxiety goes away after the event or activity is over.  GAD causes anxiety that is not necessarily related to specific events or activities. It also causes excess anxiety in proportion to specific events or activities. The anxiety associated with GAD is also difficult to control. GAD can vary from mild to severe. People with severe GAD can have intense waves of anxiety with physical symptoms (panic attacks).  SYMPTOMS The anxiety and worry associated with GAD are  difficult to control. This anxiety and worry are related to many life events and activities and also occur more days than not for 6 months or longer. People with GAD also have three or more of the following symptoms (one or more in children):  Restlessness.   Fatigue.  Difficulty concentrating.   Irritability.  Muscle tension.  Difficulty sleeping or unsatisfying sleep. DIAGNOSIS GAD is diagnosed through an assessment by your health care provider. Your health care provider will ask you questions aboutyour mood,physical symptoms, and events in your life. Your health care provider may ask you about your medical history and use of alcohol or drugs, including prescription medicines. Your health care provider may also do a physical exam and blood tests. Certain medical conditions and the use of certain substances can cause symptoms similar to those associated with GAD. Your health care provider may refer you to a mental health specialist for further evaluation. TREATMENT The following therapies are usually used to treat GAD:   Medication. Antidepressant medication usually is prescribed for long-term daily control. Antianxiety medicines may be added in severe cases, especially when panic attacks occur.   Talk therapy (psychotherapy). Certain types of talk therapy can be helpful in treating GAD by providing support, education, and guidance. A form of talk therapy called cognitive behavioral therapy can teach you healthy ways to think about and react to daily life events and activities.  Stress managementtechniques. These include yoga, meditation, and exercise and can be very helpful when they are practiced regularly. A mental health specialist can help determine which treatment is best for you. Some people see improvement with one therapy. However, other people require a combination of therapies. Document Released: 01/29/2013 Document Revised: 02/18/2014 Document Reviewed:  01/29/2013 [facility] Patient Information 2015 [place], [place]. This information is not intended to replace advice given to you by your health care provider. Make sure you discuss any questions you have with your health care provider.

## 2015-06-16 NOTE — Progress Notes (Addendum)
Coumadin follow up  Assessment and plan: Chronic anticoagulation- check INR and will adjust medication according to labs.  Discussed if patient falls to immediately contact office or go to ER. Discussed foods that can increase or decrease Coumadin levels. Patient understands to call the office before starting a new medication. Follow up in one month.   Depression/anxiety Will start zoloft 50mg , start 1/2 pill daily.   VT Continue amiodarone.   Hypothyroidism -check TSH level, continue medications the same, reminded to take on an empty stomach 30-3mins before food.   CAD s/p CABG Control blood pressure, cholesterol, glucose, increase exercise.  Continue cardio follow up    Patient is on Coumadin for Primary Diagnosis: Permanent atrial fibrillation [I48.2] and history of mechanical heart valve.  Patient's last INR is  Lab Results  Component Value Date   INR 2.15* 06/11/2015   INR 2.22* 06/10/2015   INR 2.25* 06/09/2015    Patient denies  CP, dizziness, nose bleeds, easy bleeding, and blood in stool/urine. He has had a nonproductive cough for 1 month, no orthpnea, weight is down, no PND, no edema. He was recently in the hospital from 08/21-08/25 for palpitations and VT, discharged on amiodarone. He was taken off citalopram due to possible contribution to the VT/ QTC prolongation.  He has history of CAD s/p CABG, will have occ angina with walking long distances, very rare.  He continues to smoke and has long history of complications from this, including PAD, he is s/p fem pop bypass in 2015. He is complaining of black toenails.   His coumadin dose was changed last visit, he is on 5 mg 5 days a week and 2.5mg  2 days a week.  He is on thyroid medication. His medication was changed last visit.   Lab Results  Component Value Date   TSH 4.655* 08/18/2015  .     Current Outpatient Prescriptions on File Prior to Visit  Medication Sig Dispense Refill  . albuterol (PROVENTIL) (2.5  MG/3ML) 0.083% nebulizer solution Take 2.5 mg by nebulization every 6 (six) hours as needed for wheezing or shortness of breath.    . allopurinol (ZYLOPRIM) 300 MG tablet TAKE 1 TABLET (300 MG TOTAL) BY MOUTH DAILY. TO PREVENT GOUT 90 tablet 1  . amiodarone (PACERONE) 200 MG tablet Take 1-2 tablets (200-400 mg total) by mouth 2 (two) times daily. Take 2 tabs bid x 5 days, then 1 tab bid x 5 days then 1 tab daily 50 tablet 11  . aspirin 81 MG tablet Take 81 mg by mouth daily.    . bumetanide (BUMEX) 2 MG tablet Take 1/2 tablet daily as needed for swelling 30 tablet 6  . Cholecalciferol (VITAMIN D PO) Take 5,000 Units by mouth daily.     . citalopram (CELEXA) 40 MG tablet TAKE 1 TABLET BY MOUTH EVERY DAY 90 tablet 1  . Cyanocobalamin (VITAMIN B 12 PO) Take 1,500 mcg by mouth daily.     . fenofibrate (TRICOR) 145 MG tablet Take 1 tablet daily for Blood Fats 90 tablet 99  . ferrous sulfate 325 (65 FE) MG tablet Take 325 mg by mouth daily.    [name] ipratropium-albuterol (DUONEB) 0.5-2.5 (3) MG/3ML SOLN Inhale 3 mLs into the lungs every 6 (six) hours as needed (shortness of breath).     . isosorbide mononitrate (IMDUR) 30 MG 24 hr tablet TAKE 1 TABLET BY MOUTH EVERY DAY 90 tablet 99  . levothyroxine (SYNTHROID, LEVOTHROID) 50 MCG tablet TAKE 1 TABLET BY MOUTH EVERY DAY  90 tablet 1  . loratadine (CLARITIN) 10 MG tablet Take 10 mg by mouth daily.    . nitroGLYCERIN (NITROSTAT) 0.4 MG SL tablet Place 1 tablet (0.4 mg total) under the tongue every 5 (five) minutes as needed for chest pain. 25 tablet 5  . pravastatin (PRAVACHOL) 40 MG tablet TAKE 1 TABLET (40 MG TOTAL) BY MOUTH EVERY MORNING. 90 tablet 1  . ranitidine (ZANTAC) 300 MG tablet TAKE 1 TABLET (300 MG TOTAL) BY MOUTH AT BEDTIME. 90 tablet 1  . spironolactone (ALDACTONE) 25 MG tablet Take 1 tablet (25 mg total) by mouth daily. 30 tablet 4  . tiotropium (SPIRIVA) 18 MCG inhalation capsule Place 18 mcg into inhaler and inhale daily as needed (for  shortness of breath).     . warfarin (COUMADIN) 5 MG tablet Take 0.5-1 tablets (2.5-5 mg total) by mouth daily. Take 2.5mg  on Monday and Friday.  5mg  all other days of the week. 90 tablet 3   No current facility-administered medications on file prior to visit.   Past Medical History  Diagnosis Date  . Mechanical heart valve present     a. Hx mechanical St Jude AVR 2000.  . Cataracts, bilateral   . OSA (obstructive sleep apnea)   . Permanent atrial fibrillation   . Ischemic cardiomyopathy   . History of echocardiogram 04/17/2013    EF 40-45%; LV hypertrophy; LV mild-mod dilated; AV regurg.; LA severely diated, RA mildly dilated  . History of Doppler ultrasound 04/05/2013    LEAs; small distal abd. aoritc aneuysm is stable; fem-pop graft to R leg has 50-69% blockage  . History of nuclear stress test 05/14/2011    Dipyridamole; moderate perfusion defect in Basal Infrioer & Mid Inferior region - consistent w/infarct or scar; global LV systolic function mildly reduced; EKG negative for ischemia; low risk scan  . COPD (chronic obstructive pulmonary disease)   . Hyperlipidemia   . Hypertension   . Kidney cysts   . CKD (chronic kidney disease), stage III   . Hiatal hernia   . GERD (gastroesophageal reflux disease)   . Prediabetes   . Gout   . AAA (abdominal aortic aneurysm)   . Vitamin B deficiency   . Peripheral arterial disease     a. s/p aorto bifem BG;  b. 11/2013 PVA distal R Fem-fem stenosis;  c. 12/2013 PTA of dist R femoral bypass graft.  [name] CAD (coronary artery disease)     a. CABG '00. b. all SVGs occluded 2010. c. low risk Nuc 2012   . Chronic anticoagulation   . Ventricular tachycardia (paroxysmal)     Seen by Dr. Lovena Le with EP, started on amiodarone   Allergies  Allergen Reactions  . Chantix [Varenicline] Nausea And Vomiting  . Lyrica [Pregabalin]     Swelling, couldn't breathe  . Nsaids Other (See Comments)    Unknown   . Simvastatin     Pt said he had a "bleed out"  after taking it  . Ziac [Bisoprolol-Hydrochlorothiazide]     fatigue   Review of Systems  Constitutional: Negative.   HENT: Negative.   Respiratory: Positive for cough and shortness of breath (unchanged). Negative for hemoptysis, sputum production and wheezing.   Cardiovascular: Negative.   Gastrointestinal: Negative.   Genitourinary: Negative.   Musculoskeletal: Negative.   Skin: Negative.   Neurological: Negative.   Psychiatric/Behavioral: Negative.     Physical: Height 5\' 8"  (1.727 m), weight 224 lb 12.8 oz (101.969 kg). Filed Weights   06/16/15 1136  Weight: 224 lb 12.8 oz (101.969 kg)  Blood pressure 132/70, pulse 60, temperature 98.2 F (36.8 C), resp. rate 20, height 5\' 8"  (1.727 m), weight 224 lb 12.8 oz (101.969 kg).  General Appearance: In no apparent distress. ENT/Mouth: Nares clear with no erythema, swelling, mucus on turbinates. No ulcers, cracking, on lips. No erythema, swelling, or exudate on post pharynx.  Neck: Supple, thyroid normal.  Respiratory: Decreased breath sounds but no wheezing/rhochi/rales.   Cardio: Irregular, irregular rhythm, with systolic mechanical click without rubs or gallops. No edema  Abdomen: Soft, with bowl sounds. nontender, no guarding, rebound, hernias, masses, or organomegaly.  Skin: Warm, dry without rashes, lesions, ecchymosis. Right big toe with mid line of black/brown with good distal cap refill, no ulcers.  Neuro: Unremarkable  Vicie Mutters, PA-C

## 2015-06-17 ENCOUNTER — Telehealth: Payer: Self-pay

## 2015-06-17 NOTE — Telephone Encounter (Signed)
Patient requesting recent OV notes and lab results to be sent to Dr. Risa Grill at [facility]. Records were faxed to Dr. Risa Grill [phone].

## 2015-06-24 ENCOUNTER — Ambulatory Visit (INDEPENDENT_AMBULATORY_CARE_PROVIDER_SITE_OTHER): Payer: PPO | Admitting: Physician Assistant

## 2015-06-24 ENCOUNTER — Encounter: Payer: Self-pay | Admitting: Physician Assistant

## 2015-06-24 VITALS — BP 132/52 | HR 58 | Ht 68.5 in | Wt 225.9 lb

## 2015-06-24 DIAGNOSIS — I714 Abdominal aortic aneurysm, without rupture, unspecified: Secondary | ICD-10-CM

## 2015-06-24 DIAGNOSIS — N183 Chronic kidney disease, stage 3 unspecified: Secondary | ICD-10-CM

## 2015-06-24 DIAGNOSIS — G4733 Obstructive sleep apnea (adult) (pediatric): Secondary | ICD-10-CM | POA: Diagnosis not present

## 2015-06-24 DIAGNOSIS — I10 Essential (primary) hypertension: Secondary | ICD-10-CM

## 2015-06-24 DIAGNOSIS — I739 Peripheral vascular disease, unspecified: Secondary | ICD-10-CM | POA: Diagnosis not present

## 2015-06-24 DIAGNOSIS — Z952 Presence of prosthetic heart valve: Secondary | ICD-10-CM

## 2015-06-24 DIAGNOSIS — F17200 Nicotine dependence, unspecified, uncomplicated: Secondary | ICD-10-CM

## 2015-06-24 DIAGNOSIS — I4729 Other ventricular tachycardia: Secondary | ICD-10-CM

## 2015-06-24 DIAGNOSIS — Z720 Tobacco use: Secondary | ICD-10-CM

## 2015-06-24 DIAGNOSIS — I472 Ventricular tachycardia: Secondary | ICD-10-CM | POA: Diagnosis not present

## 2015-06-24 DIAGNOSIS — E782 Mixed hyperlipidemia: Secondary | ICD-10-CM

## 2015-06-24 DIAGNOSIS — I4821 Permanent atrial fibrillation: Secondary | ICD-10-CM

## 2015-06-24 DIAGNOSIS — I701 Atherosclerosis of renal artery: Secondary | ICD-10-CM

## 2015-06-24 DIAGNOSIS — E1122 Type 2 diabetes mellitus with diabetic chronic kidney disease: Secondary | ICD-10-CM

## 2015-06-24 DIAGNOSIS — Z7901 Long term (current) use of anticoagulants: Secondary | ICD-10-CM

## 2015-06-24 DIAGNOSIS — N189 Chronic kidney disease, unspecified: Secondary | ICD-10-CM

## 2015-06-24 DIAGNOSIS — I482 Chronic atrial fibrillation: Secondary | ICD-10-CM

## 2015-06-24 NOTE — Patient Instructions (Signed)
Your physician wants you to follow-up in: 3 Months with Dr [name]. You will receive a reminder letter in the mail two months in advance. If you don't receive a letter, please call our office to schedule the follow-up appointment.

## 2015-06-24 NOTE — Progress Notes (Signed)
Patient ID: Robert Stanton, male   DOB: 04/09/1941, 74 y.o.   MRN: [id number]    Date:  06/24/2015   ID:  Robert Stanton, DOB [birth year], MRN [id number]  PCP:  Alesia Richards, MD  Primary Cardiologist:  [facility]   Chief Complaint  Patient presents with  . Hospitalization Follow-up    a-fib, ventricular tachy, pt denied chest pain     History of Present Illness: Robert Stanton is a 74 y.o. male  Robert Stanton is a 74 y.o. male with a history of CKD stage III, PAD, CAD (s/p CABG in 2000, all VG occluded by cath 2010, low risk nuc 2012), s/p mechanical St. Jude AVR in 2000, LV dysfunction EF 40-45% in 2014, OSA, COPD, permanent AF and h/o flutter (s/p DCCV in 2012 but now chronic AF).Also with PVD.   He developed palpitations, noted his heart rate to be 167 on a home machine. He developed chest pain with this. He called EMS and was in a wide complex tachycardia that spontaneously converted to his usual A. fib enroute to the hospital. He was admitted 06/08/15 for symptomatic WCT worrisome for VT and chest pain.   Cardiac enzymes were mildly elevated, but this was felt secondary to the tachycardia. Once the arrhythmia improved, his chest pain resolved.  He also has chronic kidney disease stage III-4. Since his chest pain resolved and his enzymes were only minimally elevated, medical therapy is planned.  An EP consult was completed. He had been started on IV amiodarone on admission. A TSH was within normal limits and LFTs were okay. He had no significant electrolyte abnormalities, specifically his potassium was greater than 4 and his magnesium was 2.0. The arrhythmia was felt to be ventricular tachycardia. Amiodarone was felt to be his best option and he was switched to the oral formulation.  His ventricular ectopy significantly decreased.  His atrial fibrillation is chronic but he has slow ventricular response at times. Therefore, a beta blocker was not indicated. Monitoring was  continued with the amiodarone loading. His Coumadin was also monitored and was therapeutic at discharge. His family physician enters this and he is to keep his appointment next week for Coumadin check.  On 06/11/2015, he was seen by Dr. Meda Coffee and all data were reviewed. A taper for his amiodarone load was set up. He was not having any additional long runs of VT.   Patient presents for posthospital follow-up. He reports feeling fine.  Completed tapering down his amiodarone to 200 mg daily.  He denies shortness of breath more than usual as well as claudication more than usual.    The patient currently denies nausea, vomiting, fever, chest pain, shortness of breath, orthopnea, dizziness, PND, cough, congestion, abdominal pain, hematochezia, melena, lower extremity edema, claudication.  Wt Readings from Last 3 Encounters:  06/24/15 225 lb 14.4 oz (102.468 kg)  06/16/15 224 lb 12.8 oz (101.969 kg)  06/09/15 227 lb 4.7 oz (103.1 kg)     Past Medical History  Diagnosis Date  . Mechanical heart valve present     a. Hx mechanical St Jude AVR 2000.  . Cataracts, bilateral   . OSA (obstructive sleep apnea)   . Permanent atrial fibrillation   . Ischemic cardiomyopathy   . History of echocardiogram 04/17/2013    EF 40-45%; LV hypertrophy; LV mild-mod dilated; AV regurg.; LA severely diated, RA mildly dilated  . History of Doppler ultrasound 04/05/2013    LEAs; small distal abd. aoritc aneuysm is stable; fem-pop  graft to R leg has 50-69% blockage  . History of nuclear stress test 05/14/2011    Dipyridamole; moderate perfusion defect in Basal Infrioer & Mid Inferior region - consistent w/infarct or scar; global LV systolic function mildly reduced; EKG negative for ischemia; low risk scan  . COPD (chronic obstructive pulmonary disease)   . Hyperlipidemia   . Hypertension   . Kidney cysts   . CKD (chronic kidney disease), stage III   . Hiatal hernia   . GERD (gastroesophageal reflux disease)   .  Prediabetes   . Gout   . AAA (abdominal aortic aneurysm)   . Vitamin B deficiency   . Peripheral arterial disease     a. s/p aorto bifem BG;  b. 11/2013 PVA distal R Fem-fem stenosis;  c. 12/2013 PTA of dist R femoral bypass graft.  [name] CAD (coronary artery disease)     a. CABG '00. b. all SVGs occluded 2010. c. low risk Nuc 2012   . Chronic anticoagulation   . Ventricular tachycardia (paroxysmal)     Seen by Dr. Lovena Le with EP, started on amiodarone    Current Outpatient Prescriptions  Medication Sig Dispense Refill  . albuterol (PROVENTIL) (2.5 MG/3ML) 0.083% nebulizer solution Take 2.5 mg by nebulization every 6 (six) hours as needed for wheezing or shortness of breath.    . allopurinol (ZYLOPRIM) 300 MG tablet TAKE 1 TABLET (300 MG TOTAL) BY MOUTH DAILY. TO PREVENT GOUT 90 tablet 1  . amiodarone (PACERONE) 200 MG tablet Take 200 mg by mouth daily.    [name] aspirin 81 MG tablet Take 81 mg by mouth daily.    . bumetanide (BUMEX) 2 MG tablet Take 1/2 tablet daily as needed for swelling 30 tablet 6  . Cholecalciferol (VITAMIN D PO) Take 5,000 Units by mouth daily.     . Cyanocobalamin (VITAMIN B 12 PO) Take 1,500 mcg by mouth daily.     . fenofibrate (TRICOR) 145 MG tablet Take 1 tablet daily for Blood Fats 90 tablet 99  . ferrous sulfate 325 (65 FE) MG tablet Take 325 mg by mouth daily.    [name] ipratropium-albuterol (DUONEB) 0.5-2.5 (3) MG/3ML SOLN Inhale 3 mLs into the lungs every 6 (six) hours as needed (shortness of breath).     . isosorbide mononitrate (IMDUR) 30 MG 24 hr tablet TAKE 1 TABLET BY MOUTH EVERY DAY 90 tablet 99  . levothyroxine (SYNTHROID, LEVOTHROID) 50 MCG tablet TAKE 1 TABLET BY MOUTH EVERY DAY 90 tablet 1  . loratadine (CLARITIN) 10 MG tablet Take 10 mg by mouth daily.    . nitroGLYCERIN (NITROSTAT) 0.4 MG SL tablet Place 1 tablet (0.4 mg total) under the tongue every 5 (five) minutes as needed for chest pain. 25 tablet 5  . pravastatin (PRAVACHOL) 40 MG tablet TAKE 1 TABLET  (40 MG TOTAL) BY MOUTH EVERY MORNING. 90 tablet 1  . ranitidine (ZANTAC) 300 MG tablet TAKE 1 TABLET (300 MG TOTAL) BY MOUTH AT BEDTIME. 90 tablet 1  . sertraline (ZOLOFT) 50 MG tablet Take 1 tablet (50 mg total) by mouth daily. 30 tablet 2  . spironolactone (ALDACTONE) 25 MG tablet Take 1 tablet (25 mg total) by mouth daily. 30 tablet 4  . tiotropium (SPIRIVA) 18 MCG inhalation capsule Place 18 mcg into inhaler and inhale daily as needed (for shortness of breath).     . verapamil (CALAN) 80 MG tablet Take 1 tablet by mouth 2 (two) times daily.  1  . warfarin (COUMADIN) 5 MG tablet  Take 0.5-1 tablets (2.5-5 mg total) by mouth daily. Take 2.5mg  on Monday and Friday.  5mg  all other days of the week. 90 tablet 3   No current facility-administered medications for this visit.    Allergies:    Allergies  Allergen Reactions  . Chantix [Varenicline] Nausea And Vomiting  . Lyrica [Pregabalin]     Swelling, couldn't breathe  . Nsaids Other (See Comments)    Unknown   . Simvastatin     Pt said he had a "bleed out" after taking it  . Ziac [Bisoprolol-Hydrochlorothiazide]     fatigue    Social History:  The patient  reports that he has been smoking Cigarettes.  He has a 2 pack-year smoking history. He has never used smokeless tobacco. He reports that he does not drink alcohol or use illicit drugs.   Family history:   Family History  Problem Relation Age of Onset  . Heart attack Father   . Heart attack Mother   . Lung cancer Sister     x 2  . Kidney cancer Sister   . Kidney disease Sister   . Cancer Brother     ?  . Stroke Brother     ROS:  Please see the history of present illness.  All other systems reviewed and negative.   PHYSICAL EXAM: VS:  BP 132/52 mmHg  Pulse 58  Ht 5' 8.5" (1.74 m)  Wt 225 lb 14.4 oz (102.468 kg)  BMI 33.84 kg/m2 Obese, well developed, in no acute distress HEENT: Pupils are equal round react to light accommodation extraocular movements are intact.  Neck:  no JVDNo cervical lymphadenopathy. Cardiac: Irregular rate and rhythm. Rate controlled. Can echo valve clicks heard and crisp. No murmurs Lungs:  clear to auscultation bilaterally, no wheezing, rhonchi or rales Abd: soft, nontender, positive bowel sounds all quadrants, no hepatosplenomegaly Ext: no lower extremity edema.  2+ radial pulses. Skin: warm and dry Neuro:  Grossly normal  EKG:  Atrial fibrillation with conduction delay, rate 58 bpm    ASSESSMENT AND PLAN:  Problem List Items Addressed This Visit    Ventricular tachycardia (paroxysmal)   Relevant Medications   verapamil (CALAN) 80 MG tablet   amiodarone (PACERONE) 200 MG tablet   Type 2 diabetes mellitus with chronic kidney disease   Tobacco use disorder   Renal artery stenosis   Relevant Medications   verapamil (CALAN) 80 MG tablet   amiodarone (PACERONE) 200 MG tablet   Permanent atrial fibrillation (Chronic)   Relevant Medications   verapamil (CALAN) 80 MG tablet   amiodarone (PACERONE) 200 MG tablet   PAD,s/p aorto bifem BG, s/p Rt Fem PTA 3/15 (Chronic)   Relevant Medications   verapamil (CALAN) 80 MG tablet   amiodarone (PACERONE) 200 MG tablet   OSA (obstructive sleep apnea) (Chronic)   Long term current use of anticoagulant therapy   Hyperlipidemia   Relevant Medications   verapamil (CALAN) 80 MG tablet   amiodarone (PACERONE) 200 MG tablet   Essential hypertension - Primary (Chronic)   Relevant Medications   verapamil (CALAN) 80 MG tablet   amiodarone (PACERONE) 200 MG tablet   CKD (chronic kidney disease) stage 3, GFR 30-59 ml/min (Chronic)   AAA (abdominal aortic aneurysm)   Relevant Medications   verapamil (CALAN) 80 MG tablet   amiodarone (PACERONE) 200 MG tablet      Ventricular tachycardia: Patient has completed his amiodarone taper and is now taking 200 mg daily. We will update his prescription.  Heart rate is well controlled at 58 bpm.    Peripheral arterial disease. As to be stable.   Last lower extremity arterial Dopplers were October 2015. She consider rechecking these at his next visit.  Permanent atrial fibrillation Rate controlled. Only on amiodarone which was specifically started for his VT.  On Coumadin  Mechanical aortic valve  Mechanical aortic valve is crisp clicks without any murmurs. Last INR 2.73  Essential hypertension  He is on verapamil 80 mg twice daily Blood pressure controlled.  Cardiomyopathy, ischemic EF 40-45%.  Appears euvolemic. Continue when necessary Bumex.     Tobacco abuse: Cessation discussed

## 2015-06-26 ENCOUNTER — Ambulatory Visit (INDEPENDENT_AMBULATORY_CARE_PROVIDER_SITE_OTHER): Payer: PPO | Admitting: Podiatry

## 2015-06-26 ENCOUNTER — Encounter: Payer: Self-pay | Admitting: Podiatry

## 2015-06-26 DIAGNOSIS — M779 Enthesopathy, unspecified: Secondary | ICD-10-CM | POA: Diagnosis not present

## 2015-06-26 DIAGNOSIS — Q828 Other specified congenital malformations of skin: Secondary | ICD-10-CM | POA: Diagnosis not present

## 2015-06-26 DIAGNOSIS — B351 Tinea unguium: Secondary | ICD-10-CM

## 2015-06-26 DIAGNOSIS — M79676 Pain in unspecified toe(s): Secondary | ICD-10-CM

## 2015-06-26 NOTE — Progress Notes (Signed)
He presents today with a chief complaint of a painful fifth digit of the right foot and painful elongated toenails.   Objective: Vital signs are stable he's alert and oriented 3. Pulses are strongly palpable. He has pain on palpation fifth digit of the right foot overlying the PIPJ. He also has reactive porokeratotic lesion between the fourth and fifth digits of the right foot. His nails are thick yellow dystrophic clinic with mycotic and painful palpation.  Assessment: Pain in limb secondary to onychomycosis 1 through 5 bilateral. Capsulitis fifth digit right foot. Porokeratosis fifth digit right foot.  Plan: Debrided nails 1 through 5 bilateral. Injected the fifth digit with dehydrated alcohol and local and aesthetic. The level of the capsulitis. And then debrided the reactive hyperkeratosis as well. Follow up with him in 3 months   Roselind Messier DPM

## 2015-07-06 ENCOUNTER — Encounter: Payer: Self-pay | Admitting: Physician Assistant

## 2015-07-06 DIAGNOSIS — I472 Ventricular tachycardia: Secondary | ICD-10-CM

## 2015-07-06 DIAGNOSIS — I4729 Other ventricular tachycardia: Secondary | ICD-10-CM

## 2015-07-07 MED ORDER — AMIODARONE HCL 200 MG PO TABS: 200.0000 mg | ORAL_TABLET | Freq: Every day | ORAL | Status: DC

## 2015-07-09 ENCOUNTER — Other Ambulatory Visit: Payer: Self-pay | Admitting: Physician Assistant

## 2015-07-17 ENCOUNTER — Ambulatory Visit (INDEPENDENT_AMBULATORY_CARE_PROVIDER_SITE_OTHER): Payer: PPO | Admitting: Internal Medicine

## 2015-07-17 ENCOUNTER — Encounter: Payer: Self-pay | Admitting: Internal Medicine

## 2015-07-17 VITALS — BP 108/62 | HR 60 | Temp 98.4°F | Resp 18 | Ht 68.0 in | Wt 226.0 lb

## 2015-07-17 DIAGNOSIS — Z952 Presence of prosthetic heart valve: Secondary | ICD-10-CM

## 2015-07-17 DIAGNOSIS — Z7901 Long term (current) use of anticoagulants: Secondary | ICD-10-CM

## 2015-07-17 DIAGNOSIS — G4733 Obstructive sleep apnea (adult) (pediatric): Secondary | ICD-10-CM

## 2015-07-17 DIAGNOSIS — Z23 Encounter for immunization: Secondary | ICD-10-CM

## 2015-07-17 DIAGNOSIS — G4730 Sleep apnea, unspecified: Secondary | ICD-10-CM

## 2015-07-17 NOTE — Progress Notes (Signed)
Patient ID: [name], male   DOB: 10/28/1940, 74 y.o.   MRN: [id number]  Assessment and Plan:   1. Need for prophylactic vaccination and inoculation against influenza  - Flu vaccine HIGH DOSE PF (Fluzone High dose)  2. Status post mechanical aortic valve replacement -cont coumadin - Protime-INR  3. Long term current use of anticoagulant therapy -cont coumadin - Protime-INR  4. Sleep apnea with use of continuous positive airway pressure (CPAP)  - For home use only DME continuous positive airway pressure (CPAP)  HPI 74 y.o.male presents for 1 month follow up of PT/INR for chronic atrial fibrillation. Patient reports that they have been doing well.  male is taking their medication.  They are not having difficulty with their medications.  They report no adverse reactions.  He reports that he has been taking 2.5 mg 5 days per week and 5 mg 2 days a week.  He reports no abx, no falls, no abnormal bleeding.  He also reports that he needs a new facemask for his cpap machine. He uses it every night.  He reports that he feels that the mask fits okay but he feels that the straps are worn out.  He has had the machine for 10 years.  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    Past Medical History  Diagnosis Date  . Mechanical heart valve present     a. Hx mechanical [employer] AVR 2000.  . Cataracts, bilateral   . OSA (obstructive sleep apnea)   . Permanent atrial fibrillation   . Ischemic cardiomyopathy   . History of echocardiogram 04/17/2013    EF 40-45%; LV hypertrophy; LV mild-mod dilated; AV regurg.; LA severely diated, RA mildly dilated  . History of Doppler ultrasound 04/05/2013    LEAs; small distal abd. aoritc aneuysm is stable; fem-pop graft to R leg has 50-69% blockage  . History of nuclear stress test 05/14/2011    Dipyridamole; moderate perfusion defect in Basal Infrioer & Mid Inferior region - consistent w/infarct or scar;  global LV systolic function mildly reduced; EKG negative for ischemia; low risk scan  . COPD (chronic obstructive pulmonary disease)   . Hyperlipidemia   . Hypertension   . Kidney cysts   . CKD (chronic kidney disease), stage III   . Hiatal hernia   . GERD (gastroesophageal reflux disease)   . Prediabetes   . Gout   . AAA (abdominal aortic aneurysm)   . Vitamin B deficiency   . Peripheral arterial disease     a. s/p aorto bifem BG;  b. 11/2013 PVA distal R Fem-fem stenosis;  c. 12/2013 PTA of dist R femoral bypass graft.  [name] CAD (coronary artery disease)     a. CABG '00. b. all SVGs occluded 2010. c. low risk Nuc 2012   . Chronic anticoagulation   . Ventricular tachycardia (paroxysmal)     Seen by Dr. Lovena Le with EP, started on amiodarone     Allergies  Allergen Reactions  . Chantix [Varenicline] Nausea And Vomiting  . Lyrica [Pregabalin]     Swelling, couldn't breathe  . Nsaids Other (See Comments)    Unknown   . Simvastatin     Pt said he had a "bleed out" after taking it  . Ziac [Bisoprolol-Hydrochlorothiazide]     fatigue      Current Outpatient Prescriptions on File Prior to Visit  Medication Sig Dispense Refill  . albuterol (  PROVENTIL) (2.5 MG/3ML) 0.083% nebulizer solution Take 2.5 mg by nebulization every 6 (six) hours as needed for wheezing or shortness of breath.    . allopurinol (ZYLOPRIM) 300 MG tablet TAKE 1 TABLET (300 MG TOTAL) BY MOUTH DAILY. TO PREVENT GOUT 90 tablet 1  . amiodarone (PACERONE) 200 MG tablet Take 1 tablet (200 mg total) by mouth daily. 30 tablet 5  . aspirin 81 MG tablet Take 81 mg by mouth daily.    . bumetanide (BUMEX) 2 MG tablet Take 1/2 tablet daily as needed for swelling 30 tablet 6  . Cholecalciferol (VITAMIN D PO) Take 5,000 Units by mouth daily.     . Cyanocobalamin (VITAMIN B 12 PO) Take 1,500 mcg by mouth daily.     . fenofibrate (TRICOR) 145 MG tablet Take 1 tablet daily for Blood Fats 90 tablet 99  . ferrous sulfate 325 (65 FE)  MG tablet Take 325 mg by mouth daily.    [name] ipratropium-albuterol (DUONEB) 0.5-2.5 (3) MG/3ML SOLN Inhale 3 mLs into the lungs every 6 (six) hours as needed (shortness of breath).     . isosorbide mononitrate (IMDUR) 30 MG 24 hr tablet TAKE 1 TABLET BY MOUTH EVERY DAY 90 tablet 99  . levothyroxine (SYNTHROID, LEVOTHROID) 50 MCG tablet TAKE 1 TABLET BY MOUTH EVERY DAY 90 tablet 1  . loratadine (CLARITIN) 10 MG tablet Take 10 mg by mouth daily.    . nitroGLYCERIN (NITROSTAT) 0.4 MG SL tablet Place 1 tablet (0.4 mg total) under the tongue every 5 (five) minutes as needed for chest pain. 25 tablet 5  . pravastatin (PRAVACHOL) 40 MG tablet TAKE 1 TABLET (40 MG TOTAL) BY MOUTH EVERY MORNING. 90 tablet 1  . ranitidine (ZANTAC) 300 MG tablet TAKE 1 TABLET (300 MG TOTAL) BY MOUTH AT BEDTIME. 90 tablet 1  . sertraline (ZOLOFT) 50 MG tablet Take 1 tablet (50 mg total) by mouth daily. 30 tablet 2  . spironolactone (ALDACTONE) 25 MG tablet Take 1 tablet (25 mg total) by mouth daily. 30 tablet 4  . tiotropium (SPIRIVA) 18 MCG inhalation capsule Place 18 mcg into inhaler and inhale daily as needed (for shortness of breath).     . verapamil (CALAN) 80 MG tablet TAKE 1 TABLET (80 MG TOTAL) BY MOUTH 2 (TWO) TIMES DAILY. 180 tablet 1  . warfarin (COUMADIN) 5 MG tablet Take 0.5-1 tablets (2.5-5 mg total) by mouth daily. Take 2.5mg  on Monday and Friday.  5mg  all other days of the week. 90 tablet 3   No current facility-administered medications on file prior to visit.    ROS: all negative except above.   Physical Exam: Filed Weights   07/17/15 1034  Weight: 226 lb (102.513 kg)   BP 108/62 mmHg  Pulse 60  Temp(Src) 98.4 F (36.9 C) (Temporal)  Resp 18  Ht 5\' 8"  (1.727 m)  Wt 226 lb (102.513 kg)  BMI 34.37 kg/m2 General Appearance: Well developed well nourished, non-toxic appearing in no apparent distress, smells of smoke. Eyes: PERRLA, EOMs, conjunctiva w/ no swelling or erythema or discharge Sinuses: No  Frontal/maxillary tenderness ENT/Mouth: Ear canals clear without swelling or erythema.  TM's normal bilaterally with no retractions, bulging, or loss of landmarks.   Neck: Supple, thyroid normal, no notable JVD  Respiratory: Respiratory effort normal, Clear breath sounds anteriorly and posteriorly bilaterally without rales, rhonchi, wheezing or stridor. No retractions or accessory muscle usage. Cardio: RRR with no MRGs.   Abdomen: Soft, + BS.  Non tender, no guarding, rebound,  hernias, masses.  Musculoskeletal: Full ROM, 5/5 strength, normal gait.  Skin: Warm, dry without rashes  Neuro: Awake and oriented X 3, Cranial nerves intact. Normal muscle tone, no cerebellar symptoms. Sensation intact.  Psych: normal affect, Insight and Judgment appropriate.     [name], PA-C 10:54 AM [facility]

## 2015-07-18 LAB — PROTIME-INR
INR: 3.96 — ABNORMAL HIGH (ref <1.50)
Prothrombin Time: 39.3 seconds — ABNORMAL HIGH (ref 11.6–15.2)

## 2015-07-19 ENCOUNTER — Encounter: Payer: Self-pay | Admitting: Physician Assistant

## 2015-07-28 ENCOUNTER — Telehealth: Payer: Self-pay

## 2015-07-28 NOTE — Telephone Encounter (Signed)
Patient states that [facility] has contacted patient to come out to set up Bi-pap machine. Patient already has CPAP machine. Patient was unaware that bi-pap was going to be ordered. Patient is also scheduled at [facility] next week to evaluate sleep apnea and to order a new face mask. Do you want him to get bi-pap or stay on cpap? Please advise.

## 2015-08-03 ENCOUNTER — Other Ambulatory Visit: Payer: Self-pay | Admitting: Internal Medicine

## 2015-08-11 ENCOUNTER — Ambulatory Visit: Payer: PPO | Admitting: Cardiovascular Disease

## 2015-08-18 ENCOUNTER — Encounter: Payer: Self-pay | Admitting: Internal Medicine

## 2015-08-18 ENCOUNTER — Other Ambulatory Visit: Payer: Self-pay | Admitting: Cardiovascular Disease

## 2015-08-18 ENCOUNTER — Ambulatory Visit (INDEPENDENT_AMBULATORY_CARE_PROVIDER_SITE_OTHER): Payer: PPO | Admitting: Internal Medicine

## 2015-08-18 VITALS — BP 126/62 | HR 52 | Temp 97.7°F | Resp 16 | Ht 68.0 in | Wt 235.2 lb

## 2015-08-18 DIAGNOSIS — I129 Hypertensive chronic kidney disease with stage 1 through stage 4 chronic kidney disease, or unspecified chronic kidney disease: Secondary | ICD-10-CM

## 2015-08-18 DIAGNOSIS — E559 Vitamin D deficiency, unspecified: Secondary | ICD-10-CM

## 2015-08-18 DIAGNOSIS — Z0000 Encounter for general adult medical examination without abnormal findings: Secondary | ICD-10-CM | POA: Insufficient documentation

## 2015-08-18 DIAGNOSIS — Z6835 Body mass index (BMI) 35.0-35.9, adult: Secondary | ICD-10-CM

## 2015-08-18 DIAGNOSIS — I4821 Permanent atrial fibrillation: Secondary | ICD-10-CM

## 2015-08-18 DIAGNOSIS — I739 Peripheral vascular disease, unspecified: Secondary | ICD-10-CM

## 2015-08-18 DIAGNOSIS — K219 Gastro-esophageal reflux disease without esophagitis: Secondary | ICD-10-CM | POA: Diagnosis not present

## 2015-08-18 DIAGNOSIS — I10 Essential (primary) hypertension: Secondary | ICD-10-CM | POA: Diagnosis not present

## 2015-08-18 DIAGNOSIS — I2510 Atherosclerotic heart disease of native coronary artery without angina pectoris: Secondary | ICD-10-CM

## 2015-08-18 DIAGNOSIS — E1122 Type 2 diabetes mellitus with diabetic chronic kidney disease: Secondary | ICD-10-CM

## 2015-08-18 DIAGNOSIS — Z79899 Other long term (current) drug therapy: Secondary | ICD-10-CM

## 2015-08-18 DIAGNOSIS — G4733 Obstructive sleep apnea (adult) (pediatric): Secondary | ICD-10-CM

## 2015-08-18 DIAGNOSIS — I482 Chronic atrial fibrillation: Secondary | ICD-10-CM | POA: Diagnosis not present

## 2015-08-18 DIAGNOSIS — N183 Chronic kidney disease, stage 3 (moderate): Secondary | ICD-10-CM

## 2015-08-18 DIAGNOSIS — Z952 Presence of prosthetic heart valve: Secondary | ICD-10-CM | POA: Diagnosis not present

## 2015-08-18 DIAGNOSIS — N184 Chronic kidney disease, stage 4 (severe): Secondary | ICD-10-CM

## 2015-08-18 DIAGNOSIS — I700 Atherosclerosis of aorta: Secondary | ICD-10-CM

## 2015-08-18 DIAGNOSIS — E782 Mixed hyperlipidemia: Secondary | ICD-10-CM

## 2015-08-18 DIAGNOSIS — M1000 Idiopathic gout, unspecified site: Secondary | ICD-10-CM | POA: Diagnosis not present

## 2015-08-18 DIAGNOSIS — Z7901 Long term (current) use of anticoagulants: Secondary | ICD-10-CM

## 2015-08-18 LAB — HEMOGLOBIN A1C
HEMOGLOBIN A1C: 5.9 % — AB (ref <5.7)
Mean Plasma Glucose: 123 mg/dL — ABNORMAL HIGH (ref <117)

## 2015-08-18 LAB — LIPID PANEL
CHOL/HDL RATIO: 5.6 ratio — AB (ref <=5.0)
CHOLESTEROL: 185 mg/dL (ref 125–200)
HDL: 33 mg/dL — AB (ref >=40)
LDL Cholesterol: 107 mg/dL (ref <130)
Triglycerides: 226 mg/dL — ABNORMAL HIGH (ref <150)
VLDL: 45 mg/dL — AB (ref <30)

## 2015-08-18 LAB — CBC WITH DIFFERENTIAL/PLATELET
Basophils Absolute: 0.1 10*3/uL (ref 0.0–0.1)
Basophils Relative: 1 % (ref 0–1)
EOS PCT: 4 % (ref 0–5)
Eosinophils Absolute: 0.2 10*3/uL (ref 0.0–0.7)
HCT: 35.4 % — ABNORMAL LOW (ref 39.0–52.0)
HEMOGLOBIN: 11.4 g/dL — AB (ref 13.0–17.0)
LYMPHS ABS: 1.7 10*3/uL (ref 0.7–4.0)
LYMPHS PCT: 27 % (ref 12–46)
MCH: 31.4 pg (ref 26.0–34.0)
MCHC: 32.2 g/dL (ref 30.0–36.0)
MCV: 97.5 fL (ref 78.0–100.0)
MONO ABS: 0.6 10*3/uL (ref 0.1–1.0)
MPV: 10.3 fL (ref 8.6–12.4)
Monocytes Relative: 10 % (ref 3–12)
Neutro Abs: 3.6 10*3/uL (ref 1.7–7.7)
Neutrophils Relative %: 58 % (ref 43–77)
PLATELETS: 256 10*3/uL (ref 150–400)
RBC: 3.63 MIL/uL — ABNORMAL LOW (ref 4.22–5.81)
RDW: 14.7 % (ref 11.5–15.5)
WBC: 6.2 10*3/uL (ref 4.0–10.5)

## 2015-08-18 LAB — BASIC METABOLIC PANEL WITH GFR
BUN: 38 mg/dL — ABNORMAL HIGH (ref 7–25)
CHLORIDE: 102 mmol/L (ref 98–110)
CO2: 27 mmol/L (ref 20–31)
CREATININE: 2.89 mg/dL — AB (ref 0.70–1.18)
Calcium: 9 mg/dL (ref 8.6–10.3)
GFR, EST NON AFRICAN AMERICAN: 21 mL/min — AB (ref >=60)
GFR, Est African American: 24 mL/min — ABNORMAL LOW (ref >=60)
Glucose, Bld: 128 mg/dL — ABNORMAL HIGH (ref 65–99)
POTASSIUM: 4.6 mmol/L (ref 3.5–5.3)
SODIUM: 140 mmol/L (ref 135–146)

## 2015-08-18 LAB — HEPATIC FUNCTION PANEL
ALT: 15 U/L (ref 9–46)
AST: 19 U/L (ref 10–35)
Albumin: 4 g/dL (ref 3.6–5.1)
Alkaline Phosphatase: 54 U/L (ref 40–115)
BILIRUBIN DIRECT: 0.1 mg/dL (ref <=0.2)
BILIRUBIN INDIRECT: 0.3 mg/dL (ref 0.2–1.2)
BILIRUBIN TOTAL: 0.4 mg/dL (ref 0.2–1.2)
Total Protein: 7 g/dL (ref 6.1–8.1)

## 2015-08-18 LAB — MAGNESIUM: MAGNESIUM: 2.1 mg/dL (ref 1.5–2.5)

## 2015-08-18 LAB — URIC ACID: Uric Acid, Serum: 7.6 mg/dL (ref 4.0–7.8)

## 2015-08-18 LAB — TSH: TSH: 4.655 u[IU]/mL — AB (ref 0.350–4.500)

## 2015-08-18 NOTE — Patient Instructions (Signed)
Warfarin: What You Need to Know Warfarin is an anticoagulant. Anticoagulants help prevent the formation of blood clots. They also help stop the growth of blood clots. Warfarin is sometimes referred to as a "blood thinner."  Normally, when body tissues are cut or damaged, the blood clots in order to prevent blood loss. Sometimes clots form inside your blood vessels and obstruct the flow of blood through your circulatory system (thrombosis). These clots may travel through your bloodstream and become lodged in smaller blood vessels in your brain, which can cause a stroke, or in your lungs (pulmonary embolism). WHO SHOULD USE WARFARIN? Warfarin is prescribed for people at risk of developing harmful blood clots:  People with surgically implanted mechanical heart valves, irregular heart rhythms called atrial fibrillation, and certain clotting disorders.  People who have developed harmful blood clotting in the past, including those who have had a stroke or a pulmonary embolism, or thrombosis in their legs (deep vein thrombosis [DVT]).  People with an existing blood clot, such as a pulmonary embolism. WARFARIN DOSING Warfarin tablets come in different strengths. Each tablet strength is a different color, with the amount of warfarin (in milligrams) clearly printed on the tablet. If the color of your tablet is different than usual when you receive a new prescription, report it immediately to your pharmacist or health care provider. WARFARIN MONITORING The goal of warfarin therapy is to lessen the clotting tendency of blood but not prevent clotting completely. Your health care provider will monitor the anticoagulation effect of warfarin closely and adjust your dose as needed. For your safety, blood tests called prothrombin time (PT) or international normalized ratio (INR) are used to measure the effects of warfarin. Both of these tests can be done with a finger stick or a blood draw. The longer it takes the  blood to clot, the higher the PT or INR. Your health care provider will inform you of your "target" PT or INR range. If, at any time, your PT or INR is above the target range, there is a risk of bleeding. If your PT or INR is below the target range, there is a risk of clotting. Whether you are started on warfarin while you are in the hospital or in your health care provider's office, you will need to have your PT or INR checked within one week of starting the medicine. Initially, some people are asked to have their PT or INR checked as much as twice a week. Once you are on a stable maintenance dose, the PT or INR is checked less often, usually once every 2 to 4 weeks. The warfarin dose may be adjusted if the PT or INR is not within the target range. It is important to keep all laboratory and health care provider follow-up appointments. Not keeping appointments could result in a chronic or permanent injury, pain, or disability because warfarin is a medicine that requires close monitoring. WHAT ARE THE SIDE EFFECTS OF WARFARIN?  Too much warfarin can cause bleeding (hemorrhage) from any part of the body. This may include bleeding from the gums, blood in the urine, bloody or dark stools, a nosebleed that is not easily stopped, coughing up blood, or vomiting blood.  Too little warfarin can increase the risk of blood clots.  Too little or too much warfarin can also increase the risk of a stroke.  Warfarin use may cause a skin rash or irritation, an unusual fever, continual nausea or stomach upset, or severe pain in your joints or back.  SPECIAL PRECAUTIONS WHILE TAKING WARFARIN Warfarin should be taken exactly as directed. It is very important to take warfarin as directed since bleeding or blood clots could result in chronic or permanent injury, pain, or disability.  Take your medicine at the same time every day. If you forget to take your dose, you can take it if it is within 6 hours of when it was  due.  Do not change the dose of warfarin on your own to make up for missed or extra doses.  If you miss more than 2 doses in a row, you should contact your health care provider for advice. Avoid situations that cause bleeding. You may have a tendency to bleed more easily than usual while taking warfarin. The following actions can limit bleeding:  Using a softer toothbrush.  Flossing with waxed floss rather than unwaxed floss.  Shaving with an electric razor rather than a blade.  Limiting the use of sharp objects.  Avoiding potentially harmful activities, such as contact sports. Warfarin and Pregnancy or Breastfeeding  Warfarin is not advised during the first trimester of pregnancy due to an increased risk of birth defects. In certain situations, a woman may take warfarin after her first trimester of pregnancy. A woman who becomes pregnant or plans to become pregnant while taking warfarin should notify her health care provider immediately.  Although warfarin does not pass into breast milk, a woman who wishes to breastfeed while taking warfarin should also consult with her health care provider. Alcohol, Smoking, and Illicit Drug Use  Alcohol affects how warfarin works in the body. It is best to avoid alcoholic drinks or consume very small amounts while taking warfarin. In general, alcohol intake should be limited to 1 oz (30 mL) of liquor, 6 oz (180 mL) of wine, or 12 oz (360 mL) of beer each day. Notify your health care provider if you change your alcohol intake.  Smoking affects how warfarin works. It is best to avoid smoking while taking warfarin. Notify your health care provider if you change your smoking habits.  It is best to avoid all illicit drugs while taking warfarin since there are few studies that show how warfarin interacts with these drugs. Other Medicines and Dietary Supplements Many prescription and over-the-counter medicines can interfere with warfarin. Be sure all of your  health care providers know you are taking warfarin. Notify your health care provider who prescribed warfarin for you or your pharmacist before starting or stopping any new medicines, including over-the-counter vitamins, dietary supplements, and pain medicines. Your warfarin dose may need to be adjusted. Some common over-the-counter medicines that may increase the risk of bleeding while taking warfarin include:   Acetaminophen.  Aspirin.  Nonsteroidal anti-inflammatory medicines (NSAIDs), such as ibuprofen or naproxen.  Vitamin E. Dietary Considerations  Foods that have moderate or high amounts of vitamin K can interfere with warfarin. Avoid major changes in your diet or notify your health care provider before changing your diet. Eat a consistent amount of foods that have moderate or high amounts of vitamin K. Eating less foods containing vitamin K can increase the risk of bleeding. Eating more foods containing vitamin K can increase the risk of blood clots. Additional questions about dietary considerations can be discussed with a dietitian. Foods that are very high in vitamin K:  Greens, such as Swiss chard and beet, collard, mustard, or turnip greens (fresh or frozen, cooked).  Kale (fresh or frozen, cooked).  Parsley (raw).  Spinach (cooked). Foods that are high   in vitamin K:  Asparagus (frozen, cooked).  Broccoli.  Bok choy (cooked).  Brussels sprouts (fresh or frozen, cooked).  Cabbage (cooked).   Coleslaw. Foods that are moderately high in vitamin K:  Blueberries.  Black-eyed peas.  Endive (raw).  Green leaf lettuce (raw).  Green scallions (raw).  Kale (raw).  Okra (frozen, cooked).  Plantains (fried).  Romaine lettuce (raw).  Sauerkraut (canned).  Spinach (raw). CALL YOUR CLINIC OR HEALTH CARE PROVIDER IF YOU:  Plan to have any surgery or procedure.  Feel sick, especially if you have diarrhea or vomiting.  Experience or anticipate any major changes  in your diet.  Start or stop a prescription or over-the-counter medicine.  Become, plan to become, or think you may be pregnant.  Are having heavier than usual menstrual periods.  Have had a fall, accident, or any symptoms of bleeding or unusual bruising.  Develop an unusual fever. CALL 911 IN THE [place] OR GO TO THE EMERGENCY DEPARTMENT IF YOU:   Think you may be having an allergic reaction to warfarin. The signs of an allergic reaction could include itching, rash, hives, swelling, chest tightness, or trouble breathing.  See signs of blood in your urine. The signs could include reddish, pinkish, or tea-colored urine.  See signs of blood in your stools. The signs could include bright red or black stools.  Vomit or cough up blood. In these instances, the blood could have either a bright red or a "coffee-grounds" appearance.  Have bleeding that will not stop after applying pressure for 30 minutes such as cuts, nosebleeds, or other injuries.  Have severe pain in your joints or back.  Have a new and severe headache.  Have sudden weakness or numbness of your face, arm, or leg, especially on one side of your body.  Have sudden confusion or trouble understanding.  Have sudden trouble seeing in one or both eyes.  Have sudden trouble walking, dizziness, loss of balance, or coordination.  Have trouble speaking or understanding (aphasia).   This information is not intended to replace advice given to you by your health care provider. Make sure you discuss any questions you have with your health care provider.   Document Released: 10/04/2005 Document Revised: 10/25/2014 Document Reviewed: 03/30/2013 [employer] Interactive Patient Education 2016 [employer]  +++++++++++++++++++++++++++++++++++++++++++ Recommend Adult Low Dose Aspirin or   coated  Aspirin 81 mg daily   To reduce risk of Colon Cancer 20 %,   Skin Cancer 26 % ,   Melanoma 46%   and   Pancreatic cancer  60%   ++++++++++++++++++++++++++++++++++++++++++++++++++++++  Vitamin D goal   is between 70-100.   Please make sure that you are taking your Vitamin D as directed.   It is very important as a natural anti-inflammatory   helping hair, skin, and nails, as well as reducing stroke and heart attack risk.   It helps your bones and helps with mood.  It also decreases numerous cancer risks so please take it as directed.   Low Vit D is associated with a 200-300% higher risk for CANCER   and 200-300% higher risk for HEART   ATTACK  &  STROKE.   .....................................[name]  It is also associated with higher death rate at younger ages,   autoimmune diseases like Rheumatoid arthritis, Lupus, Multiple Sclerosis.     Also many other serious conditions, like depression, Alzheimer's  Dementia, infertility, muscle aches, fatigue, fibromyalgia - just to name a few.  ++++++++++++++++++++++++++++++++++++++++++++++++  Recommend the book "The END  of DIETING" by Dr Excell Seltzer   & the book "The END of DIABETES " by Dr Excell Seltzer  At [email or web address] - get book & Audio CD's     Being diabetic has a  300% increased risk for heart attack, stroke, cancer, and alzheimer- type vascular dementia. It is very important that you work harder with diet by avoiding all foods that are white. Avoid white rice (brown & wild rice is OK), white potatoes (sweetpotatoes in moderation is OK), White bread or wheat bread or anything made out of white flour like bagels, donuts, rolls, buns, biscuits, cakes, pastries, cookies, pizza crust, and pasta (made from white flour & egg whites) - vegetarian pasta or spinach or wheat pasta is OK. Multigrain breads like Arnold's or [employer], or multigrain sandwich thins or flatbreads.  Diet, exercise and weight loss can reverse and cure diabetes in the early stages.  Diet, exercise and weight loss is very important in the control and prevention of complications of  diabetes which affects every system in your body, ie. Brain - dementia/stroke, eyes - glaucoma/blindness, heart - heart attack/heart failure, kidneys - dialysis, stomach - gastric paralysis, intestines - malabsorption, nerves - severe painful neuritis, circulation - gangrene & loss of a leg(s), and finally cancer and Alzheimers.    I recommend avoid fried & greasy foods,  sweets/candy, white rice (brown or wild rice or Quinoa is OK), white potatoes (sweet potatoes are OK) - anything made from white flour - bagels, doughnuts, rolls, buns, biscuits,white and wheat breads, pizza crust and traditional pasta made of white flour & egg white(vegetarian pasta or spinach or wheat pasta is OK).  Multi-grain bread is OK - like multi-grain flat bread or sandwich thins. Avoid alcohol in excess. Exercise is also important.    Eat all the vegetables you want - avoid meat, especially red meat and dairy - especially cheese.  Cheese is the most concentrated form of trans-fats which is the worst thing to clog up our arteries. Veggie cheese is OK which can be found in the fresh produce section at [employer] or [employer] or [employer]  ++++++++++++++++++++++++++++++++++++++++++++++++++ DASH Eating Plan  DASH stands for "Dietary Approaches to Stop Hypertension."   The DASH eating plan is a healthy eating plan that has been shown to reduce high blood pressure (hypertension). Additional health benefits may include reducing the risk of type 2 diabetes mellitus, heart disease, and stroke. The DASH eating plan may also help with weight loss.  WHAT DO I NEED TO KNOW ABOUT THE DASH EATING PLAN? For the DASH eating plan, you will follow these general guidelines:  Choose foods with a percent daily value for sodium of less than 5% (as listed on the food label).  Use salt-free seasonings or herbs instead of table salt or sea salt.  Check with your health care provider or pharmacist before using salt substitutes.  Eat  lower-sodium products, often labeled as "lower sodium" or "no salt added."  Eat fresh foods.  Eat more vegetables, fruits, and low-fat dairy products.    Choose whole grains. Look for the word "whole" as the first word in the ingredient list.  Choose fish   Limit sweets, desserts, sugars, and sugary drinks.  Choose heart-healthy fats.  Eat veggie cheese   Eat more home-cooked food and less restaurant, buffet, and fast food.  Limit fried foods.  Cook foods using methods other than frying.  Limit canned vegetables. If you do use them, rinse them well to decrease the  sodium.  When eating at a restaurant, ask that your food be prepared with less salt, or no salt if possible.                      WHAT FOODS CAN I EAT?  Seek help from a dietitian for individual calorie needs. Grains Whole grain or whole wheat bread. Brown rice. Whole grain or whole wheat pasta. Quinoa, bulgur, and whole grain cereals. Low-sodium cereals. Corn or whole wheat flour tortillas. Whole grain cornbread. Whole grain crackers. Low-sodium crackers.  Vegetables Fresh or frozen vegetables (raw, steamed, roasted, or grilled). Low-sodium or reduced-sodium tomato and vegetable juices. Low-sodium or reduced-sodium tomato sauce and paste. Low-sodium or reduced-sodium canned vegetables.   Fruits All fresh, canned (in natural juice), or frozen fruits.  Meat and Other Protein Products  All fish and seafood.  Dried beans, peas, or lentils. Unsalted nuts and seeds. Unsalted canned beans. Dairy Low-fat dairy products, such as skim or 1% milk, 2% or reduced-fat cheeses, low-fat ricotta or cottage cheese, or plain low-fat yogurt. Low-sodium or reduced-sodium cheeses.  Fats and Oils Tub margarines without trans fats. Light or reduced-fat mayonnaise and salad dressings (reduced sodium). Avocado. Safflower, olive, or canola oils. Natural peanut or almond butter.  Other Unsalted popcorn and pretzels. The items listed  above may not be a complete list of recommended foods or beverages. Contact your dietitian for more options.  +++++++++++++++++++++++++++++++++++++++++++  WHAT FOODS ARE NOT RECOMMENDED?  Grains/ White flour or wheat flour  White bread. White pasta. White rice. Refined cornbread. Bagels and croissants. Crackers that contain trans fat.  Vegetables  Creamed or fried vegetables. Vegetables in a . Regular canned vegetables. Regular canned tomato sauce and paste. Regular tomato and vegetable juices.  Fruits Dried fruits. Canned fruit in light or heavy syrup. Fruit juice.  Meat and Other Protein Products Meat in general. Fatty cuts of meat. Ribs, chicken wings, bacon, sausage, bologna, salami, chitterlings, fatback, hot dogs, bratwurst, and packaged luncheon meats. Salted nuts and seeds. Canned beans with salt.  Dairy Whole or 2% milk, cream, half-and-half, and cream cheese. Whole-fat or sweetened yogurt. Full-fat cheeses or blue cheese. Nondairy creamers and whipped toppings. Processed cheese, cheese spreads, or cheese curds.  Condiments Onion and garlic salt, seasoned salt, table salt, and sea salt. Canned and packaged gravies. Worcestershire sauce. Tartar sauce. Barbecue sauce. Teriyaki sauce. Soy sauce, including reduced sodium. Steak sauce. Fish sauce. Oyster sauce. Cocktail sauce. Horseradish. Ketchup and mustard. Meat flavorings and tenderizers. Bouillon cubes. Hot sauce. Tabasco sauce. Marinades. Taco seasonings. Relishes.  Fats and Oils Butter, stick margarine, lard, shortening, ghee, and bacon fat. Coconut, palm kernel, or palm oils. Regular salad dressings.  Pickles and olives. Salted popcorn and pretzels. The items listed above may not be a complete list of foods and beverages to avoid.

## 2015-08-18 NOTE — Progress Notes (Signed)
Patient ID: Robert Stanton, male   DOB: May 26, 1941, 74 y.o.   MRN: [id number]   This very nice 74 y.o.male presents for 3 month follow up with Hypertension, Hyperlipidemia, Pre-Diabetes and Vitamin D Deficiency.    Patient is treated for HTN & BP has been controlled at home. Today's BP: 126/62 mmHg. In 2000, patient underwent CABG and St Jude AoVR. Patient has had no complaints of any cardiac type chest pain, palpitations, orthopnea/PND, dizziness, claudication, or dependent edema. Patient is being followed by Dr [name] also by Dr Posey Pronto for his  and    Hyperlipidemia is controlled with diet & meds. Patient denies myalgias or other med SE's. Last Lipids were not at goal with Cholesterol 187; HDL 30*; LDL 119*; and elevated Triglycerides 190 on 06/09/2015.    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  Last A1c was  6.0% on 06/09/2015.    Further, the patient also has history of Vitamin D Deficiency  Of 22 in 2009 and supplements vitamin D without any suspected side-effects. Last vitamin D was still low at  43 on 05/15/2015.   Medication Sig  . albuterol (PROVENTIL) (2.5 MG/3ML) 0.083% nebulizer solution Take 2.5 mg by nebulization every 6 (six) hours as needed for wheezing or shortness of breath.  . allopurinol (ZYLOPRIM) 300 MG tablet TAKE 1 TABLET (300 MG TOTAL) BY MOUTH DAILY. TO PREVENT GOUT  . amiodarone (PACERONE) 200 MG tablet Take 1 tablet (200 mg total) by mouth daily.  [name] aspirin 81 MG tablet Take 81 mg by mouth daily.  . bumetanide  2 MG tablet Take 1/2 tablet daily as needed for swelling  . VITAMIN D Take 5,000 Units by mouth daily.   [name] VITAMIN B 12 PO Take 1,500 mcg by mouth daily.   . fenofibrate 145 MG tablet Take 1 tablet daily for Blood Fats  . ferrous sulfate 325 (65 FE) MG Take 325 mg by mouth daily.   . DUONEB 0.5-2.5MG /3ML SOLN Inhale 3 mLs into the lungs every 6 (six) hours as needed (shortness of breath).   . IMDUR 30 MG 24 hr tablet TAKE 1 TABLET BY MOUTH EVERY DAY  . levothyroxine  50 MCG tablet TAKE 1 TABLET BY MOUTH EVERY DAY  . loratadine  10 MG tablet Take 10 mg by mouth daily.  [name] NITROSTAT 0.4 MG SL tablet Place 1 tablet (0.4 mg total) under the tongue every 5 (five) minutes as needed for chest pain.  . pravastatin  40 MG tablet TAKE 1 TABLET (40 MG TOTAL) BY MOUTH EVERY MORNING.  . ranitidine  300 MG tablet TAKE 1 TABLET (300 MG TOTAL) BY MOUTH AT BEDTIME.  [name] sertraline  50 MG tablet Take 1 tablet (50 mg total) by mouth daily.  [name] SPIRIVA 18 MCG inhal cap Place 18 mcg into inhaler and inhale daily as needed (for shortness of breath).   . verapamil 80 MG tablet TAKE 1 TABLET (80 MG TOTAL) BY MOUTH 2 (TWO) TIMES DAILY.  [name] warfarin ( 5 MG tablet Take 0.5-1 tablets (2.5-5 mg total) by mouth daily. Take 2.5mg  on Monday and Friday.  5mg  all other days of the week.  . citalopram  40 MG tablet TAKE 1 TABLET BY MOUTH EVERY DAY  . spironolactone  25 MG tablet Take 1 tablet (25 mg total) by mouth  daily.   Allergies  Allergen Reactions  . Chantix [Varenicline] Nausea And Vomiting  . Lyrica [Pregabalin]     Swelling, couldn't breathe  . Nsaids Other (See Comments)    Unknown   . Simvastatin     Pt said he had a "bleed out" after taking it  . Ziac [Bisoprolol-Hydrochlorothiazide]     fatigue   PMHx:   Past Medical History  Diagnosis Date  . Mechanical heart valve present     a. Hx mechanical St Jude AVR 2000.  . Cataracts, bilateral   . OSA (obstructive sleep apnea)   . Permanent atrial fibrillation ([place])   . Ischemic cardiomyopathy   . History of echocardiogram 04/17/2013    EF 40-45%; LV hypertrophy; LV mild-mod dilated; AV regurg.; LA severely diated, RA mildly dilated  . History of Doppler ultrasound 04/05/2013    LEAs; small distal abd. aoritc aneuysm is stable; fem-pop graft  to R leg has 50-69% blockage  . History of nuclear stress test 05/14/2011    Dipyridamole; moderate perfusion defect in Basal Infrioer & Mid Inferior region - consistent w/infarct or scar; global LV systolic function mildly reduced; EKG negative for ischemia; low risk scan  . COPD (chronic obstructive pulmonary disease) ([place])   . Hyperlipidemia   . Hypertension   . Kidney cysts   . CKD (chronic kidney disease), stage III   . Hiatal hernia   . GERD (gastroesophageal reflux disease)   . Prediabetes   . Gout   . AAA (abdominal aortic aneurysm) ([place])   . Vitamin B deficiency   . Peripheral arterial disease ([place])     a. s/p aorto bifem BG;  b. 11/2013 PVA distal R Fem-fem stenosis;  c. 12/2013 PTA of dist R femoral bypass graft.  [name] CAD (coronary artery disease)     a. CABG '00. b. all SVGs occluded 2010. c. low risk Nuc 2012   . Chronic anticoagulation   . Ventricular tachycardia (paroxysmal) ([place])     Seen by Dr. Lovena Le with EP, started on amiodarone   Immunization History  Administered Date(s) Administered  . DT 01/25/2012  . Influenza Split 06/28/2013  . Influenza, High Dose Seasonal PF 08/02/2014, 07/17/2015  . Pneumococcal Conjugate-13 03/25/2014  . Pneumococcal Polysaccharide-23 08/11/2012  . Zoster 10/30/2010   Past Surgical History  Procedure Laterality Date  . Stents in kidneys    . Coronary artery bypass graft  2000    3 vessel; LIMA to LAD; RCA, OM  . Shunts in femoral arteries    . Cardioversion  8/282012  . Aortic valve replacement      St. Jude  . Femoral bypass    . Cardiac catheterization  11/25/2008    loss of 2/3 (RCA, OM) bypass grafts with patent internal mammary artery to LAD; large collateral filling of RCA ; osteal narrowing of circumflex of ~50%  . Lower extremity angiogram N/A 12/03/2013    Procedure: LOWER EXTREMITY ANGIOGRAM;  Surgeon: Lorretta Harp, MD;  Location: [facility] CATH LAB;  Service: Cardiovascular;  Laterality: N/A;  . Lower extremity angiogram N/A  01/07/2014    Procedure: LOWER EXTREMITY ANGIOGRAM;  Surgeon: Lorretta Harp, MD;  Location: [facility] CATH LAB;  Service: Cardiovascular;  Laterality: N/A;   FHx:    Reviewed / unchanged  SHx:    Reviewed / unchanged  Systems Review:  Constitutional: Denies fever, chills, wt changes, headaches, insomnia, fatigue, night sweats, change in appetite. Eyes: Denies redness, blurred vision, diplopia, discharge, itchy, watery eyes.  ENT: Denies discharge, congestion, post nasal drip, epistaxis, sore throat, earache, hearing loss, dental pain, tinnitus, vertigo, sinus pain, snoring.  CV: Denies chest pain, palpitations, irregular heartbeat, syncope, dyspnea, diaphoresis, orthopnea, PND, claudication or edema. Respiratory: denies cough, dyspnea, DOE, pleurisy, hoarseness, laryngitis, wheezing.  Gastrointestinal: Denies dysphagia, odynophagia, heartburn, reflux, water brash, abdominal pain or cramps, nausea, vomiting, bloating, diarrhea, constipation, hematemesis, melena, hematochezia  or hemorrhoids. Genitourinary: Denies dysuria, frequency, urgency, nocturia, hesitancy, discharge, hematuria or flank pain. Musculoskeletal: Denies arthralgias, myalgias, stiffness, jt. swelling, pain, limping or strain/sprain.  Skin: Denies pruritus, rash, hives, warts, acne, eczema or change in skin lesion(s). Neuro: No weakness, tremor, incoordination, spasms, paresthesia or pain. Psychiatric: Denies confusion, memory loss or sensory loss. Endo: Denies change in weight, skin or hair change.  Heme/Lymph: No excessive bleeding, bruising or enlarged lymph nodes.  Physical Exam  BP 126/62 mmHg  Pulse 52  Temp(Src) 97.7 F (36.5 C)  Resp 16  Ht 5\' 8"  (1.727 m)  Wt 235 lb 3.2 oz (106.686 kg)  BMI 35.77 kg/m2  Appears over nourished and in no distress. Eyes: PERRLA, EOMs, conjunctiva no swelling or erythema. Sinuses: No frontal/maxillary tenderness ENT/Mouth: EAC's clear, TM's nl w/o erythema, bulging. Nares clear w/o  erythema, swelling, exudates. Oropharynx clear without erythema or exudates. Oral hygiene is good. Tongue normal, non obstructing. Hearing intact.  Neck: Supple. Thyroid nl. Car 2+/2+ without bruits, nodes or JVD. Chest: Respirations nl with BS clear & equal w/o rales, rhonchi, wheezing or stridor.  Cor: Heart sounds normal w/ regular rate and rhythm without sig. murmurs, gallops, clicks, or rubs. Peripheral pulses normal and equal  without edema.  Abdomen: Soft & bowel sounds normal. Non-tender w/o guarding, rebound, hernias, masses, or organomegaly.  Lymphatics: Unremarkable.  Musculoskeletal: Full ROM all peripheral extremities, joint stability, 5/5 strength, and normal gait.  Skin: Warm, dry without exposed rashes, lesions or ecchymosis apparent.  Neuro: Cranial nerves intact, reflexes equal bilaterally. Sensory-motor testing grossly intact. Tendon reflexes grossly intact.  Pysch: Alert & oriented x 3.  Insight and judgement nl & appropriate. No ideations.  Assessment and Plan:  1. Essential hypertension  - TSH  2. Hyperlipidemia  - Lipid panel  3. Type 2 diabetes mellitus with diabetic chronic kidney disease, unspecified CKD stage, unspecified long term insulin use status (HCC)  - Hemoglobin A1c - Insulin, random  4. Vitamin D deficiency  - Vit D  25 hydroxy   5. Idiopathic gout  - Uric acid  6. Morbid obesity, unspecified obesity type ([place])   7. Status post mechanical aortic valve replacement   8. OSA (obstructive sleep apnea)   9. Peripheral arterial disease ([place])   10. Permanent atrial fibrillation ([place])   11. PAD,s/p aorto bifem BG, s/p Rt Fem PTA 3/15   12. Gastroesophageal reflux disease, esophagitis presence not specified   13. Atherosclerosis of native coronary artery of native heart without angina pectoris   14. BMI 35.77,  adult   15. Long term current use of anticoagulant therapy  - Protime-INR  16. Encounter for long-term (current) use  of medications  - CBC with Differential/Platelet - BASIC METABOLIC PANEL WITH GFR - Hepatic function panel - Magnesium  17. 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 Recommended labs to assess and monitor clinical status. Further disposition pending results of labs. Over 30 minutes of exam, counseling, chart review was performed

## 2015-08-19 LAB — PROTIME-INR
INR: 1.68 — ABNORMAL HIGH (ref <1.50)
PROTHROMBIN TIME: 20 s — AB (ref 11.6–15.2)

## 2015-08-19 LAB — VITAMIN D 25 HYDROXY (VIT D DEFICIENCY, FRACTURES): VIT D 25 HYDROXY: 45 ng/mL (ref 30–100)

## 2015-08-19 LAB — INSULIN, RANDOM: Insulin: 29.9 u[IU]/mL — ABNORMAL HIGH (ref 2.0–19.6)

## 2015-08-21 ENCOUNTER — Ambulatory Visit (HOSPITAL_BASED_OUTPATIENT_CLINIC_OR_DEPARTMENT_OTHER)
Admission: RE | Admit: 2015-08-21 | Discharge: 2015-08-21 | Disposition: A | Discharge status: Home / Self Care | Payer: PPO | Source: Ambulatory Visit | Attending: Cardiovascular Disease | Admitting: Cardiovascular Disease

## 2015-08-21 ENCOUNTER — Ambulatory Visit (HOSPITAL_COMMUNITY)
Admission: RE | Admit: 2015-08-21 | Discharge: 2015-08-21 | Disposition: A | Discharge status: Home / Self Care | Payer: PPO | Source: Ambulatory Visit | Attending: Cardiovascular Disease | Admitting: Cardiovascular Disease

## 2015-08-21 DIAGNOSIS — I739 Peripheral vascular disease, unspecified: Secondary | ICD-10-CM | POA: Insufficient documentation

## 2015-08-21 DIAGNOSIS — E119 Type 2 diabetes mellitus without complications: Secondary | ICD-10-CM | POA: Diagnosis not present

## 2015-08-21 DIAGNOSIS — E785 Hyperlipidemia, unspecified: Secondary | ICD-10-CM | POA: Diagnosis not present

## 2015-08-21 DIAGNOSIS — T82858A Stenosis of vascular prosthetic devices, implants and grafts, initial encounter: Secondary | ICD-10-CM | POA: Insufficient documentation

## 2015-08-21 DIAGNOSIS — I10 Essential (primary) hypertension: Secondary | ICD-10-CM | POA: Insufficient documentation

## 2015-08-21 DIAGNOSIS — Y832 Surgical operation with anastomosis, bypass or graft as the cause of abnormal reaction of the patient, or of later complication, without mention of misadventure at the time of the procedure: Secondary | ICD-10-CM | POA: Insufficient documentation

## 2015-08-21 DIAGNOSIS — I714 Abdominal aortic aneurysm, without rupture: Secondary | ICD-10-CM | POA: Diagnosis not present

## 2015-08-21 DIAGNOSIS — I700 Atherosclerosis of aorta: Secondary | ICD-10-CM | POA: Insufficient documentation

## 2015-08-21 DIAGNOSIS — Z87891 Personal history of nicotine dependence: Secondary | ICD-10-CM | POA: Diagnosis not present

## 2015-08-21 DIAGNOSIS — I2510 Atherosclerotic heart disease of native coronary artery without angina pectoris: Secondary | ICD-10-CM | POA: Insufficient documentation

## 2015-08-22 ENCOUNTER — Telehealth: Payer: Self-pay | Admitting: Cardiovascular Disease

## 2015-08-22 NOTE — Telephone Encounter (Signed)
Calling to get results of Doppler . Please call   Thanks

## 2015-08-22 NOTE — Telephone Encounter (Signed)
Returned call, result notes updated.

## 2015-08-26 ENCOUNTER — Encounter: Payer: Self-pay | Admitting: Internal Medicine

## 2015-09-03 ENCOUNTER — Ambulatory Visit (INDEPENDENT_AMBULATORY_CARE_PROVIDER_SITE_OTHER): Payer: PPO | Admitting: Cardiovascular Disease

## 2015-09-03 VITALS — BP 150/64 | HR 60 | Ht 68.0 in | Wt 235.4 lb

## 2015-09-03 DIAGNOSIS — E669 Obesity, unspecified: Secondary | ICD-10-CM | POA: Diagnosis not present

## 2015-09-03 DIAGNOSIS — G4730 Sleep apnea, unspecified: Secondary | ICD-10-CM

## 2015-09-03 DIAGNOSIS — I739 Peripheral vascular disease, unspecified: Secondary | ICD-10-CM | POA: Diagnosis not present

## 2015-09-03 DIAGNOSIS — Z952 Presence of prosthetic heart valve: Secondary | ICD-10-CM | POA: Diagnosis not present

## 2015-09-03 NOTE — Patient Instructions (Signed)
Your physician recommends that you schedule a follow-up appointment in: 2-3 months in a sleep clinic with Dr. Claiborne Billings

## 2015-09-07 ENCOUNTER — Encounter: Payer: Self-pay | Admitting: Cardiovascular Disease

## 2015-09-07 NOTE — Progress Notes (Signed)
Patient ID: Robert Stanton, male   DOB: [birth year], 74 y.o.   MRN: [id number]     HPI: Robert Stanton  is a 74 y.o. male who has a history of severe obstructive sleep apnea diagnosed in 2007.  He has not had follow-up. He is referred for sleep clinic evaluation prior to obtaining a new equipment.  Robert Stanton is followed by Dr. [name] for cardiology care and Dr. Melford Aase for primary care.  He has a history of stage III chronic kidney disease, CAD and is status post CABG surgery in 2000 with all vein grafts documented be occluded at 2010, s/p  Mechanical St. Jude Aortic Valve Replacement in 2000 and Peripheral Arterial Disease. He has documented LV Dysfunction with an EF of 40-45% in 2004 and  has Permanent Atrial Fibrillation with remote history of Atrial Flutter.  In 2007, he was referred by Dr. Rex Kras for a sleep study and at that time had complaints of loud snoring, witnessed apnea, and nonrestorative sleep.  He was found to have severe obstructive sleep apnea with an AHI of 52.3 per hour.  There was moderate snoring.  He also had short periods of Cheyne-Stokes breathing pattern.  He underwent a CPAP titration trial in November 2007 and ultimately BiPAP auto therapy was recommended with IPAP max of 25, EPAP minimum of 16, a by flex setting of 3.  He has not been followed recently by DME company and has been getting supplies on the Internet.  He is not had any supplies over 2 years.  His DME company had been apnea and he recently contacted them with reference to getting new equipment.  He brought his machine with him today, which is now set at a maximum IPAP of 27 5, minimum EPAP of 16 and pressure support of 6 cm water pressure with by Flex setting of 2 and a ramp time of 20 minutes.  His usage is excellent, averaging 79 hours and 18 minutes over the past 7 days and 9 hours and 48 minutes over 30 days.  His 30 day average 90% pressure was 20.4/ 17.5.  His 30 day average AHI is 1.5 per hour.  His  last mask obtain was over 2 years ago.  He has had difficulty with mask leak.  In addition, at times there is intermittent machine malfunction.  An Epworth Sleepiness Scale score was calculated today as shown below and is consistent with residual hypersomnolence.  Epworth Sleepiness Scale: Situation   Chance of Dozing/Sleeping (0 = never , 1 = slight chance , 2 = moderate chance , 3 = high chance )   sitting and reading 3   watching TV 1   sitting inactive in a public place 3   being a passenger in a motor vehicle for an hour or more 1   lying down in the afternoon 3   sitting and talking to someone 0   sitting quietly after lunch (no alcohol) 0   while stopped for a few minutes in traffic as the driver 0   Total Score  11    Past Medical History  Diagnosis Date  . Mechanical heart valve present     a. Hx mechanical St Jude AVR 2000.  . Cataracts, bilateral   . OSA (obstructive sleep apnea)   . Permanent atrial fibrillation ([place])   . Ischemic cardiomyopathy   . History of echocardiogram 04/17/2013    EF 40-45%; LV hypertrophy; LV mild-mod dilated; AV regurg.; LA severely diated, RA mildly  dilated  . History of Doppler ultrasound 04/05/2013    LEAs; small distal abd. aoritc aneuysm is stable; fem-pop graft to R leg has 50-69% blockage  . History of nuclear stress test 05/14/2011    Dipyridamole; moderate perfusion defect in Basal Infrioer & Mid Inferior region - consistent w/infarct or scar; global LV systolic function mildly reduced; EKG negative for ischemia; low risk scan  . COPD (chronic obstructive pulmonary disease) ([place])   . Hyperlipidemia   . Hypertension   . Kidney cysts   . CKD (chronic kidney disease), stage III   . Hiatal hernia   . GERD (gastroesophageal reflux disease)   . Prediabetes   . Gout   . AAA (abdominal aortic aneurysm) ([place])   . Vitamin B deficiency   . Peripheral arterial disease ([place])     a. s/p aorto bifem BG;  b. 11/2013 PVA distal R Fem-fem stenosis;   c. 12/2013 PTA of dist R femoral bypass graft.  [name] CAD (coronary artery disease)     a. CABG '00. b. all SVGs occluded 2010. c. low risk Nuc 2012   . Chronic anticoagulation   . Ventricular tachycardia (paroxysmal) (HCC)     Seen by Dr. Lovena Le with EP, started on amiodarone    Past Surgical History  Procedure Laterality Date  . Stents in kidneys    . Coronary artery bypass graft  2000    3 vessel; LIMA to LAD; RCA, OM  . Shunts in femoral arteries    . Cardioversion  8/282012  . Aortic valve replacement      St. Jude  . Femoral bypass    . Cardiac catheterization  11/25/2008    loss of 2/3 (RCA, OM) bypass grafts with patent internal mammary artery to LAD; large collateral filling of RCA ; osteal narrowing of circumflex of ~50%  . Lower extremity angiogram N/A 12/03/2013    Procedure: LOWER EXTREMITY ANGIOGRAM;  Surgeon: Lorretta Harp, MD;  Location: [facility] CATH LAB;  Service: Cardiovascular;  Laterality: N/A;  . Lower extremity angiogram N/A 01/07/2014    Procedure: LOWER EXTREMITY ANGIOGRAM;  Surgeon: Lorretta Harp, MD;  Location: [facility] CATH LAB;  Service: Cardiovascular;  Laterality: N/A;    Allergies  Allergen Reactions  . Chantix [Varenicline] Nausea And Vomiting  . Lyrica [Pregabalin]     Swelling, couldn't breathe  . Nsaids Other (See Comments)    Unknown   . Simvastatin     Pt said he had a "bleed out" after taking it  . Ziac [Bisoprolol-Hydrochlorothiazide]     fatigue    Current Outpatient Prescriptions  Medication Sig Dispense Refill  . albuterol (PROVENTIL) (2.5 MG/3ML) 0.083% nebulizer solution Take 2.5 mg by nebulization every 6 (six) hours as needed for wheezing or shortness of breath.    . allopurinol (ZYLOPRIM) 300 MG tablet TAKE 1 TABLET (300 MG TOTAL) BY MOUTH DAILY. TO PREVENT GOUT 90 tablet 1  . amiodarone (PACERONE) 200 MG tablet Take 1 tablet (200 mg total) by mouth daily. 30 tablet 5  . aspirin 81 MG tablet Take 81 mg by mouth daily.    . bumetanide  (BUMEX) 2 MG tablet Take 1/2 tablet daily as needed for swelling 30 tablet 6  . Cholecalciferol (VITAMIN D PO) Take 5,000 Units by mouth daily.     . Cyanocobalamin (VITAMIN B 12 PO) Take 1,500 mcg by mouth daily.     . fenofibrate (TRICOR) 145 MG tablet Take 1 tablet daily for Blood Fats 90 tablet 99  .  ferrous sulfate 325 (65 FE) MG tablet Take 325 mg by mouth daily.    [name] ipratropium-albuterol (DUONEB) 0.5-2.5 (3) MG/3ML SOLN Inhale 3 mLs into the lungs every 6 (six) hours as needed (shortness of breath).     . isosorbide mononitrate (IMDUR) 30 MG 24 hr tablet TAKE 1 TABLET BY MOUTH EVERY DAY 90 tablet 1  . levothyroxine (SYNTHROID, LEVOTHROID) 50 MCG tablet TAKE 1 TABLET BY MOUTH EVERY DAY 90 tablet 1  . loratadine (CLARITIN) 10 MG tablet Take 10 mg by mouth daily.    . nitroGLYCERIN (NITROSTAT) 0.4 MG SL tablet Place 1 tablet (0.4 mg total) under the tongue every 5 (five) minutes as needed for chest pain. 25 tablet 5  . pravastatin (PRAVACHOL) 40 MG tablet TAKE 1 TABLET (40 MG TOTAL) BY MOUTH EVERY MORNING. 90 tablet 1  . ranitidine (ZANTAC) 300 MG tablet TAKE 1 TABLET (300 MG TOTAL) BY MOUTH AT BEDTIME. 90 tablet 1  . sertraline (ZOLOFT) 50 MG tablet Take 1 tablet (50 mg total) by mouth daily. 30 tablet 2  . tiotropium (SPIRIVA) 18 MCG inhalation capsule Place 18 mcg into inhaler and inhale daily as needed (for shortness of breath).     . verapamil (CALAN) 80 MG tablet TAKE 1 TABLET (80 MG TOTAL) BY MOUTH 2 (TWO) TIMES DAILY. 180 tablet 1  . warfarin (COUMADIN) 5 MG tablet Take 0.5-1 tablets (2.5-5 mg total) by mouth daily. Take 2.38m on Monday and Friday.  558mall other days of the week. 90 tablet 3   No current facility-administered medications for this visit.    Social History   Social History  . Marital Status: Married    Spouse Name: N/A  . Number of Children: 3  . Years of Education: N/A   Occupational History  . Not on file.   Social History Main Topics  . Smoking status:  Current Every Day Smoker -- 0.05 packs/day for 40 years    Types: Cigarettes  . Smokeless tobacco: Never Used  . Alcohol Use: No  . Drug Use: No  . Sexual Activity: Not on file   Other Topics Concern  . Not on file   Social History Narrative   [place] three sons, and grandson   Retired    Family History  Problem Relation Age of Onset  . Heart attack Father   . Heart attack Mother   . Lung cancer Sister     x 2  . Kidney cancer Sister   . Kidney disease Sister   . Cancer Brother     ?  . Stroke Brother      ROS General: Negative; No fevers, chills, or night sweats HEENT: Negative; No changes in vision or hearing, sinus congestion, difficulty swallowing Pulmonary: Negative; No cough, wheezing, shortness of breath, hemoptysis Cardiovascular: See history of present illness: No chest pain.  No palpitations.  Status post aVR. GI: Negative; No nausea, vomiting, diarrhea, or abdominal pain GU: Negative; No dysuria, hematuria, or difficulty voiding Musculoskeletal: Negative; no myalgias, joint pain, or weakness Hematologic: Negative; no easy bruising, bleeding Endocrine: Negative; no heat/cold intolerance Neuro: Negative; no changes in balance, headaches Skin: Negative; No rashes or skin lesions Psychiatric: Negative; No behavioral problems, depression Sleep: Severe obstructive sleep apnea on BiPAP therapy with daytime sleepiness, hypersomnolence,no bruxism, restless legs, hypnogognic hallucinations, no cataplexy   Physical Exam BP 150/64 mmHg  Pulse 60  Ht '5\' 8"'  (1.727 m)  Wt 235 lb 6.4 oz (106.777  kg)  BMI 35.80 kg/m2  Wt Readings from Last 3 Encounters:  09/03/15 235 lb 6.4 oz (106.777 kg)  08/18/15 235 lb 3.2 oz (106.686 kg)  07/17/15 226 lb (102.513 kg)   General: Alert, oriented, no distress.  Malodorous. Skin: normal turgor, no rashes HEENT: Normocephalic, atraumatic. Pupils round and reactive; sclera anicteric; extraocular  muscles intact; Fundi without hemorrhages or exudates Nose without nasal septal hypertrophy Mouth/Parynx benign; Mallinpatti scale 2 Neck: No JVD, no carotid briuts Lungs: clear to ausculatation and percussion; no wheezing or rales  Chest wall: No tenderness to palpation Heart: RRR, s1 s2 normal  Abdomen: soft, nontender; no hepatosplenomehaly, BS+; abdominal aorta nontender and not dilated by palpation. Back: No CVA tenderness Pulses 2+ Extremities: no clubbinbg cyanosis or edema, Homan's sign negative  Neurologic: grossly nonfocal; cranial nerves intact. Psychological: Normal affect and mood.  ECG not done today, but previous tracings reviewed.  LABS:  BMP Latest Ref Rng 08/18/2015 06/16/2015 06/09/2015  Glucose 65 - 99 mg/dL 128(H) 126(H) 106(H)  BUN 7 - 25 mg/dL 38(H) 36(H) 26(H)  Creatinine 0.70 - 1.18 mg/dL 2.89(H) 2.61(H) 2.05(H)  Sodium 135 - 146 mmol/L 140 134(L) 134(L)  Potassium 3.5 - 5.3 mmol/L 4.6 4.4 4.2  Chloride 98 - 110 mmol/L 102 97(L) 100(L)  CO2 20 - 31 mmol/L '27 27 26  ' Calcium 8.6 - 10.3 mg/dL 9.0 9.0 9.0     Hepatic Function Latest Ref Rng 08/18/2015 06/16/2015 06/09/2015  Total Protein 6.1 - 8.1 g/dL 7.0 7.1 6.9  Albumin 3.6 - 5.1 g/dL 4.0 4.2 3.9  AST 10 - 35 U/L '19 17 25  ' ALT 9 - 46 U/L 15 11 16(L)  Alk Phosphatase 40 - 115 U/L 54 49 51  Total Bilirubin 0.2 - 1.2 mg/dL 0.4 0.5 0.5  Bilirubin, Direct <=0.2 mg/dL 0.1 0.1 -     CBC Latest Ref Rng 08/18/2015 06/16/2015 06/09/2015  WBC 4.0 - 10.5 K/uL 6.2 7.1 7.0  Hemoglobin 13.0 - 17.0 g/dL 11.4(L) 12.6(L) 12.0(L)  Hematocrit 39.0 - 52.0 % 35.4(L) 37.1(L) 35.6(L)  Platelets 150 - 400 K/uL 256 244 211     Lipid Panel     Component Value Date/Time   CHOL 185 08/18/2015 1110   TRIG 226* 08/18/2015 1110   HDL 33* 08/18/2015 1110   CHOLHDL 5.6* 08/18/2015 1110   VLDL 45* 08/18/2015 1110   LDLCALC 107 08/18/2015 1110     RADIOLOGY: No results found.    ASSESSMENT AND PLAN: Robert Stanton is a 74 year old white male who has significant cardiovascular disease and is status post CABG revascularization surgery with documented graft occlusion, status post St. Jude mechanical Aortic valve replacement, who has a history of permanent atrial fibrillation, peripheral vascular disease, and chronic kidney disease.  He did recently been evaluated with wide complex tachycardia which converted to atrial fibrillation to his hospitalization in August 2016.  He has severe obstructive sleep apnea which is been documented for the past 9 years and had required BiPAP auto therapy with high pressures for optimal treatment.  We were able to interrogate his machine today since we did not have a download.  This reveals a 90% 30 day pressure at 20.4 over 17.5 with an excellent AHI of 1.5.  He is compliant and is sleeping over 9 hours per night.  There is residual  hypersomnolence and his Epworth Sleepiness Scale score is elevated at 11.  He has not had any updated equipment in over 2 years.  He has benefited from  therapy and his machine is now 74 years old and at times has had some intermittent malfunction.  He qualifies for new machine.  With his documented efficacy of therapy, I do not feel that he requires another sleep study for reevaluation, but recommend prescribing a new BiPAP auto unit and with IPAP maximum pressure of 25 and an EPAP minimum pressure of 16.  I would recommend a pressure support of 4.  After he gets his new machine, will see him in sleep clinic per Medicare requirements.  With his obesity and body mass index of 35.8, weight reduction was recommended.  Time spent: 30 minutes   Troy Sine, MD, [facility]  09/07/2015 11:41 AM

## 2015-09-09 ENCOUNTER — Other Ambulatory Visit: Payer: Self-pay

## 2015-09-09 MED ORDER — SERTRALINE HCL 50 MG PO TABS: 50.0000 mg | ORAL_TABLET | Freq: Every day | ORAL | Status: DC

## 2015-09-18 ENCOUNTER — Ambulatory Visit (INDEPENDENT_AMBULATORY_CARE_PROVIDER_SITE_OTHER): Payer: PPO | Admitting: Internal Medicine

## 2015-09-18 ENCOUNTER — Encounter: Payer: Self-pay | Admitting: Internal Medicine

## 2015-09-18 VITALS — BP 124/62 | HR 88 | Temp 98.0°F | Resp 18 | Ht 68.0 in | Wt 238.0 lb

## 2015-09-18 DIAGNOSIS — K222 Esophageal obstruction: Secondary | ICD-10-CM | POA: Diagnosis not present

## 2015-09-18 DIAGNOSIS — M1000 Idiopathic gout, unspecified site: Secondary | ICD-10-CM

## 2015-09-18 DIAGNOSIS — Z9119 Patient's noncompliance with other medical treatment and regimen: Secondary | ICD-10-CM

## 2015-09-18 DIAGNOSIS — I25119 Atherosclerotic heart disease of native coronary artery with unspecified angina pectoris: Secondary | ICD-10-CM

## 2015-09-18 DIAGNOSIS — I10 Essential (primary) hypertension: Secondary | ICD-10-CM

## 2015-09-18 DIAGNOSIS — E039 Hypothyroidism, unspecified: Secondary | ICD-10-CM

## 2015-09-18 DIAGNOSIS — I714 Abdominal aortic aneurysm, without rupture, unspecified: Secondary | ICD-10-CM

## 2015-09-18 DIAGNOSIS — Z7901 Long term (current) use of anticoagulants: Secondary | ICD-10-CM

## 2015-09-18 DIAGNOSIS — D126 Benign neoplasm of colon, unspecified: Secondary | ICD-10-CM | POA: Diagnosis not present

## 2015-09-18 DIAGNOSIS — Z6835 Body mass index (BMI) 35.0-35.9, adult: Secondary | ICD-10-CM

## 2015-09-18 DIAGNOSIS — J449 Chronic obstructive pulmonary disease, unspecified: Secondary | ICD-10-CM

## 2015-09-18 DIAGNOSIS — I701 Atherosclerosis of renal artery: Secondary | ICD-10-CM | POA: Diagnosis not present

## 2015-09-18 DIAGNOSIS — E782 Mixed hyperlipidemia: Secondary | ICD-10-CM

## 2015-09-18 DIAGNOSIS — N281 Cyst of kidney, acquired: Secondary | ICD-10-CM

## 2015-09-18 DIAGNOSIS — I472 Ventricular tachycardia: Secondary | ICD-10-CM | POA: Diagnosis not present

## 2015-09-18 DIAGNOSIS — G4733 Obstructive sleep apnea (adult) (pediatric): Secondary | ICD-10-CM | POA: Diagnosis not present

## 2015-09-18 DIAGNOSIS — F17200 Nicotine dependence, unspecified, uncomplicated: Secondary | ICD-10-CM

## 2015-09-18 DIAGNOSIS — I4821 Permanent atrial fibrillation: Secondary | ICD-10-CM

## 2015-09-18 DIAGNOSIS — Z0001 Encounter for general adult medical examination with abnormal findings: Secondary | ICD-10-CM

## 2015-09-18 DIAGNOSIS — Z79899 Other long term (current) drug therapy: Secondary | ICD-10-CM

## 2015-09-18 DIAGNOSIS — E669 Obesity, unspecified: Secondary | ICD-10-CM

## 2015-09-18 DIAGNOSIS — I739 Peripheral vascular disease, unspecified: Secondary | ICD-10-CM | POA: Diagnosis not present

## 2015-09-18 DIAGNOSIS — E1122 Type 2 diabetes mellitus with diabetic chronic kidney disease: Secondary | ICD-10-CM | POA: Diagnosis not present

## 2015-09-18 DIAGNOSIS — E559 Vitamin D deficiency, unspecified: Secondary | ICD-10-CM

## 2015-09-18 DIAGNOSIS — K219 Gastro-esophageal reflux disease without esophagitis: Secondary | ICD-10-CM | POA: Diagnosis not present

## 2015-09-18 DIAGNOSIS — F411 Generalized anxiety disorder: Secondary | ICD-10-CM

## 2015-09-18 DIAGNOSIS — I129 Hypertensive chronic kidney disease with stage 1 through stage 4 chronic kidney disease, or unspecified chronic kidney disease: Secondary | ICD-10-CM

## 2015-09-18 DIAGNOSIS — I482 Chronic atrial fibrillation: Secondary | ICD-10-CM

## 2015-09-18 DIAGNOSIS — R6889 Other general symptoms and signs: Secondary | ICD-10-CM

## 2015-09-18 DIAGNOSIS — I4729 Other ventricular tachycardia: Secondary | ICD-10-CM

## 2015-09-18 DIAGNOSIS — E539 Vitamin B deficiency, unspecified: Secondary | ICD-10-CM

## 2015-09-18 DIAGNOSIS — Z952 Presence of prosthetic heart valve: Secondary | ICD-10-CM

## 2015-09-18 DIAGNOSIS — Q6100 Congenital renal cyst, unspecified: Secondary | ICD-10-CM

## 2015-09-18 DIAGNOSIS — Z91199 Patient's noncompliance with other medical treatment and regimen due to unspecified reason: Secondary | ICD-10-CM

## 2015-09-18 DIAGNOSIS — Z0000 Encounter for general adult medical examination without abnormal findings: Secondary | ICD-10-CM

## 2015-09-18 DIAGNOSIS — N184 Chronic kidney disease, stage 4 (severe): Secondary | ICD-10-CM

## 2015-09-18 MED ORDER — MONTELUKAST SODIUM 10 MG PO TABS: 10.0000 mg | ORAL_TABLET | Freq: Every day | ORAL | Status: DC

## 2015-09-18 NOTE — Patient Instructions (Signed)
Please start taking 100 mg of your sertraline daily.  Finish what you have left of your 50 mg tablets by taking two at a time and we will call in 100 mg tablets.  Please get yourself involved in an activity like volunteering.  I think that it will help with your mood and give you a purpose.  Please start taking singulair or monteleukast to see if this will help with your runny nose.  Stop taking the loratidine for now.

## 2015-09-18 NOTE — Progress Notes (Signed)
Patient ID: Robert Stanton, male   DOB: 1941-08-19, 74 y.o.   MRN: [id number]  MEDICARE ANNUAL WELLNESS VISIT AND 1 Month FOLLOW UP Assessment:    1. Essential hypertension -currently well controlled -cont meds -DASH diet -comanaged with cards  2. Atherosclerosis of native coronary artery of native heart with angina pectoris ([place]) -followed by cards -cont coumadin -cont meds  3. PAD,s/p aorto bifem BG, s/p Rt Fem PTA 3/15 -cont coumadin -cont asa -encouraged to quit smoking  4. Permanent atrial fibrillation ([place]) -followed by cards -cont coumadin -quit smoking  5. Renal artery stenosis ([facility]) -cont meds -currently well controlled   6. Abdominal aortic aneurysm (AAA) without rupture ([place]) -followed with ultrasounds  7. Peripheral arterial disease ([facility]) -cont meds -quit smoking -exercise as often as tolerated  8. 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 Ventricular tachycardia (paroxysmal) ([place]) -resolved  10. Chronic obstructive pulmonary disease, unspecified COPD type ([place]) -cont nebs/inhalers -encouraged to quit smoking  11. OSA (obstructive sleep apnea) -cont cpap use  12. Benign neoplasm of colon, unspecified part of colon -monitored with colonoscopy -currently UTD  13. ESOPHAGEAL STRICTURE -followed by endoscopy -followed by GI -cont reflux meds  14. Gastroesophageal reflux disease, esophagitis presence not specified -cont reflux meds  15. Type 2 diabetes mellitus with diabetic chronic kidney disease, unspecified CKD stage, unspecified long term insulin use status ([place]) -currently well controlled -cont meds -diet and exercise  16. Hypothyroidism, unspecified hypothyroidism type -cont levothyroxine  17. Kidney cysts -monitored by intermittent imaging -no intervention currently required  18. Hyperlipidemia -not at goal -cont meds -diet and exercise  19.  Idiopathic gout, unspecified chronicity, unspecified site -cont meds -no current flares  20. Vitamin B deficiency -cont supplement  21. Long term current use of anticoagulant therapy -cont coumadin - Protime-INR  22. Encounter for long-term (current) use of medications   23. Vitamin D deficiency -cont supplement  24. Status post mechanical aortic valve replacement -cont coumadin -followed by cards  25. Generalized anxiety disorder -increase zoloft to 100 mg  26. Poor compliance   27. Tobacco use disorder -not ready to quit  28. Morbid obesity, unspecified obesity type ([place]) -discussed diet and exercise  29. BMI 35.77,  adult -discussed diet and exercise  30. Obesity (BMI 30-39.9) -discussed diet and exercise  31. Medicare annual wellness visit, subsequent     Over 30 minutes of exam, counseling, chart review, and critical decision making was performed  Plan:   During the course of the visit the patient was educated and counseled about appropriate screening and preventive services including:    Pneumococcal vaccine   Influenza vaccine  Prevnar 13  Td vaccine  Screening electrocardiogram  Colorectal cancer screening  Diabetes screening  Glaucoma screening  Nutrition counseling   Conditions/risks identified: BMI: Discussed weight loss, diet, and increase physical activity.  Increase physical activity: AHA recommends 150 minutes of physical activity a week.  Medications reviewed Diabetes is at goal, ACE/ARB therapy: Yes. Urinary Incontinence is not an issue: discussed non pharmacology and pharmacology options.  Fall risk: low- discussed PT, home fall assessment, medications.    Subjective:  Robert Stanton is a 74 y.o. male who presents for Medicare Annual Wellness Visit and coumadin recheck.  He reports that he has been doing well.  He hasn't missed any doses of his coumadin.  He hasn't been bleeding at all.  No hematochezia, melena,  hematuria or nose bleeds.  No falls, no hitting his head.  No antibiotics recently.   Medication Review: Current Outpatient Prescriptions on File Prior to Visit  Medication Sig Dispense Refill  . albuterol (PROVENTIL) (2.5 MG/3ML) 0.083% nebulizer solution Take 2.5 mg by nebulization every 6 (six) hours as needed for wheezing or shortness of breath.    . allopurinol (ZYLOPRIM) 300 MG tablet TAKE 1 TABLET (300 MG TOTAL) BY MOUTH DAILY. TO PREVENT GOUT 90 tablet 1  . amiodarone (PACERONE) 200 MG tablet Take 1 tablet (200 mg total) by mouth daily. 30 tablet 5  . aspirin 81 MG tablet Take 81 mg by mouth daily.    . bumetanide (BUMEX) 2 MG tablet Take 1/2 tablet daily as needed for swelling 30 tablet 6  . Cholecalciferol (VITAMIN D PO) Take 5,000 Units by mouth daily.     . Cyanocobalamin (VITAMIN B 12 PO) Take 1,500 mcg by mouth daily.     . fenofibrate (TRICOR) 145 MG tablet Take 1 tablet daily for Blood Fats 90 tablet 99  . ferrous sulfate 325 (65 FE) MG tablet Take 325 mg by mouth daily.    [name] ipratropium-albuterol (DUONEB) 0.5-2.5 (3) MG/3ML SOLN Inhale 3 mLs into the lungs every 6 (six) hours as needed (shortness of breath).     . isosorbide mononitrate (IMDUR) 30 MG 24 hr tablet TAKE 1 TABLET BY MOUTH EVERY DAY 90 tablet 1  . levothyroxine (SYNTHROID, LEVOTHROID) 50 MCG tablet TAKE 1 TABLET BY MOUTH EVERY DAY 90 tablet 1  . loratadine (CLARITIN) 10 MG tablet Take 10 mg by mouth daily.    . nitroGLYCERIN (NITROSTAT) 0.4 MG SL tablet Place 1 tablet (0.4 mg total) under the tongue every 5 (five) minutes as needed for chest pain. 25 tablet 5  . pravastatin (PRAVACHOL) 40 MG tablet TAKE 1 TABLET (40 MG TOTAL) BY MOUTH EVERY MORNING. 90 tablet 1  . ranitidine (ZANTAC) 300 MG tablet TAKE 1 TABLET (300 MG TOTAL) BY MOUTH AT BEDTIME. 90 tablet 1  . sertraline (ZOLOFT) 50 MG tablet Take 1 tablet (50 mg total) by mouth daily. 30 tablet 2  . tiotropium (SPIRIVA) 18 MCG inhalation capsule Place 18 mcg  into inhaler and inhale daily as needed (for shortness of breath).     . verapamil (CALAN) 80 MG tablet TAKE 1 TABLET (80 MG TOTAL) BY MOUTH 2 (TWO) TIMES DAILY. 180 tablet 1  . warfarin (COUMADIN) 5 MG tablet Take 0.5-1 tablets (2.5-5 mg total) by mouth daily. Take 2.5mg  on Monday and Friday.  5mg  all other days of the week. (Patient taking differently: Take 2.5-5 mg by mouth daily. ) 90 tablet 3   No current facility-administered medications on file prior to visit.    Current Problems (verified) Patient Active Problem List   Diagnosis Date Noted  . Obesity (BMI 30-39.9) 09/07/2015  . BMI 35.77,  adult 08/18/2015  . Medicare annual wellness visit, subsequent 08/18/2015  . Ventricular tachycardia (paroxysmal) ([place]) 06/11/2015  . Tobacco use disorder 05/15/2015  . Morbid obesity  (BMI 35.57) 05/15/2015  . Poor compliance 02/10/2015  . Hypothyroidism 10/07/2014  . Generalized anxiety disorder 10/03/2014  . Status post mechanical aortic valve replacement 09/02/2014  . Encounter for long-term (current) use of medications 02/21/2014  . Vitamin D deficiency 02/21/2014  . Long term current use of anticoagulant therapy 01/16/2014  . Permanent atrial fibrillation ([place]) 12/04/2013  . 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 Peripheral arterial disease ([place]) 12/02/2013  . COPD  CT done 12/20/13 ok   . Kidney cysts   . GERD (gastroesophageal reflux disease)   . Type 2 diabetes mellitus with chronic kidney disease ([place])   . Gout   . OSA (obstructive sleep apnea)   . AAA (abdominal aortic aneurysm) ([place])   . Vitamin B deficiency   . Renal artery stenosis ([place]) 05/01/2013  . PAD,s/p aorto bifem BG, s/p Rt Fem PTA 3/15 04/06/2013  . COLONIC POLYPS 12/26/2007  . Hyperlipidemia 12/26/2007  . Essential hypertension 12/26/2007  . Coronary atherosclerosis 12/26/2007  . ESOPHAGEAL STRICTURE 12/26/2007  . Sleep apnea 12/26/2007    Screening Tests Immunization History   Administered Date(s) Administered  . DT 01/25/2012  . Influenza Split 06/28/2013  . Influenza, High Dose Seasonal PF 08/02/2014, 07/17/2015  . Pneumococcal Conjugate-13 03/25/2014  . Pneumococcal Polysaccharide-23 08/11/2012  . Zoster 10/30/2010    Preventative care: Last colonoscopy: 2008 Prior vaccinations: TD or Tdap: 2013  Influenza: 2016  Pneumococcal: 2013 Prevnar13: 2015 Shingles/Zostavax: 2012  Names of Other Physician/Practitioners you currently use: 1. [facility] here for primary care 2. [place], eye doctor, last visit this year 3. unknown, dentist, last visit  Patient Care Team: Unk Pinto, MD as PCP - General (Internal Medicine) Pixie Casino, MD as Consulting Physician (Cardiology) Inda Castle, MD as Consulting Physician (Gastroenterology) Lorretta Harp, MD as Consulting Physician (Cardiology) Elmarie Shiley, MD as Consulting Physician (Nephrology) Rana Snare, MD as Consulting Physician (Urology)  Past Surgical History  Procedure Laterality Date  . Stents in kidneys    . Coronary artery bypass graft  2000    3 vessel; LIMA to LAD; RCA, OM  . Shunts in femoral arteries    . Cardioversion  8/282012  . Aortic valve replacement      [employer]  . Femoral bypass    . Cardiac catheterization  11/25/2008    loss of 2/3 (RCA, OM) bypass grafts with patent internal mammary artery to LAD; large collateral filling of RCA ; osteal narrowing of circumflex of ~50%  . Lower extremity angiogram N/A 12/03/2013    Procedure: LOWER EXTREMITY ANGIOGRAM;  Surgeon: Lorretta Harp, MD;  Location: [facility] CATH LAB;  Service: Cardiovascular;  Laterality: N/A;  . Lower extremity angiogram N/A 01/07/2014    Procedure: LOWER EXTREMITY ANGIOGRAM;  Surgeon: Lorretta Harp, MD;  Location: [facility] CATH LAB;  Service: Cardiovascular;  Laterality: N/A;   Family History  Problem Relation Age of Onset  . Heart attack Father   . Heart attack Mother    . Lung cancer Sister     x 2  . Kidney cancer Sister   . Kidney disease Sister   . Cancer Brother     ?  . Stroke Brother    Social History  Substance Use Topics  . Smoking status: Current Every Day Smoker -- 0.05 packs/day for 40 years    Types: Cigarettes  . Smokeless tobacco: Never Used  . Alcohol Use: No    MEDICARE WELLNESS OBJECTIVES: Tobacco use: He does smoke.  Patient is a former smoker. If yes, counseling given Alcohol Current alcohol use: none Osteoporosis: hypogonadism, History of fracture in the past year: no Fall risk: Minimal risk Hearing: impaired Visual acuity: normal,  does perform annual eye exam Diet: well balanced Physical activity:   Cardiac risk factors:   Depression/mood screen:   Depression screen [facility] 2/9 05/15/2015  Decreased Interest 0  Down, Depressed, Hopeless 0  PHQ - 2 Score 0  ADLs:  In your present state of health, do you have any difficulty performing the following activities: 06/09/2015 05/15/2015  Hearing? Tempie Donning  Vision? N N  Difficulty concentrating or making decisions? N N  Walking or climbing stairs? N N  Dressing or bathing? N N  Doing errands, shopping? N N     Cognitive Testing  Alert? Yes  Normal Appearance?Yes  Oriented to person? Yes  Place? Yes   Time? Yes  Recall of three objects?  Yes  Can perform simple calculations? Yes  Displays appropriate judgment?Yes  Can read the correct time from a watch face?Yes  EOL planning:     Objective:   Today's Vitals   09/18/15 1103  BP: 124/62  Pulse: 88  Temp: 98 F (36.7 C)  TempSrc: Temporal  Resp: 18  Height: 5\' 8"  (1.727 m)  Weight: 238 lb (107.956 kg)   Body mass index is 36.2 kg/(m^2).  General appearance: alert, no distress, WD/WN, male HEENT: normocephalic, sclerae anicteric, TMs pearly, nares patent, no discharge or erythema, pharynx normal. Hearing aid in place in right ear Oral cavity: MMM, no lesions Neck: supple, no lymphadenopathy, no thyromegaly,  no masses Heart: RRR, click heard best over 4th ICS.  Well healed midline sternal surgical incision noted Lungs: CTA bilaterally, no wheezes, rhonchi, or rales Abdomen: +bs, rotund soft, non tender, non distended, no masses, no hepatomegaly, no splenomegaly Musculoskeletal: nontender, no swelling, no obvious deformity Extremities: no edema, no cyanosis, no clubbing Pulses: 2+ symmetric, upper and lower extremities, normal cap refill Neurological: alert, oriented x 3, CN2-12 intact, strength normal upper extremities and lower extremities, sensation normal throughout, DTRs 2+ throughout, no cerebellar signs, gait normal Psychiatric: normal affect, behavior normal, pleasant   Medicare Attestation I have personally reviewed: The patient's medical and social history Their use of alcohol, tobacco or illicit drugs Their current medications and supplements The patient's functional ability including ADLs,fall risks, home safety risks, cognitive, and hearing and visual impairment Diet and physical activities Evidence for depression or mood disorders  The patient's weight, height, BMI, and visual acuity have been recorded in the chart.  I have made referrals, counseling, and provided education to the patient based on review of the above and I have provided the patient with a written personalized care plan for preventive services.     [name], PA-C   09/18/2015

## 2015-09-19 LAB — PROTIME-INR
INR: 2.21 — AB (ref <1.50)
PROTHROMBIN TIME: 24.8 s — AB (ref 11.6–15.2)

## 2015-09-29 ENCOUNTER — Ambulatory Visit: Payer: PPO | Admitting: Internal Medicine

## 2015-10-02 ENCOUNTER — Other Ambulatory Visit: Payer: Self-pay | Admitting: Internal Medicine

## 2015-10-07 ENCOUNTER — Ambulatory Visit (INDEPENDENT_AMBULATORY_CARE_PROVIDER_SITE_OTHER): Payer: PPO | Admitting: Podiatry

## 2015-10-07 ENCOUNTER — Encounter: Payer: Self-pay | Admitting: Podiatry

## 2015-10-07 DIAGNOSIS — M79676 Pain in unspecified toe(s): Secondary | ICD-10-CM

## 2015-10-07 DIAGNOSIS — B351 Tinea unguium: Secondary | ICD-10-CM

## 2015-10-07 NOTE — Progress Notes (Signed)
Patient ID: Robert Stanton, male   DOB: [birth year], 74 y.o.   MRN: [id number] Complaint:  Visit Type: Patient returns to my office for continued preventative foot care services. Complaint: Patient states" my nails have grown long and thick and become painful to walk and wear shoes" Patient has been diagnosed with DM with no foot complications. The patient presents for preventative foot care services. No changes to ROS.  Patient says he has no pain between his 4/5 toes right foot.  Podiatric Exam: Vascular: dorsalis pedis and posterior tibial pulses are palpable bilateral. Capillary return is immediate. Temperature gradient is WNL. Skin turgor WNL  Sensorium: Normal Semmes Weinstein monofilament test. Normal tactile sensation bilaterally. Nail Exam: Pt has thick disfigured discolored nails with subungual debris noted bilateral entire nail hallux through fifth toenails Ulcer Exam: There is no evidence of ulcer or pre-ulcerative changes or infection. Orthopedic Exam: Muscle tone and strength are WNL. No limitations in general ROM. No crepitus or effusions noted. Foot type and digits show no abnormalities. Bony prominences are unremarkable. Skin: No Porokeratosis. No infection or ulcers  Diagnosis:  Onychomycosis, , Pain in right toe, pain in left toes  Treatment & Plan Procedures and Treatment: Consent by patient was obtained for treatment procedures. The patient understood the discussion of treatment and procedures well. All questions were answered thoroughly reviewed. Debridement of mycotic and hypertrophic toenails, 1 through 5 bilateral and clearing of subungual debris. No ulceration, no infection noted.  Return Visit-Office Procedure: Patient instructed to return to the office for a follow up visit 3 months for continued evaluation and treatment.    Gardiner Barefoot DPM

## 2015-10-21 ENCOUNTER — Ambulatory Visit (INDEPENDENT_AMBULATORY_CARE_PROVIDER_SITE_OTHER): Payer: PPO | Admitting: Physician Assistant

## 2015-10-21 ENCOUNTER — Encounter: Payer: Self-pay | Admitting: Physician Assistant

## 2015-10-21 VITALS — BP 136/80 | HR 71 | Temp 98.2°F | Resp 14 | Ht 68.0 in | Wt 240.0 lb

## 2015-10-21 DIAGNOSIS — I472 Ventricular tachycardia, unspecified: Secondary | ICD-10-CM

## 2015-10-21 DIAGNOSIS — G4733 Obstructive sleep apnea (adult) (pediatric): Secondary | ICD-10-CM

## 2015-10-21 DIAGNOSIS — Z9119 Patient's noncompliance with other medical treatment and regimen: Secondary | ICD-10-CM

## 2015-10-21 DIAGNOSIS — Z0000 Encounter for general adult medical examination without abnormal findings: Secondary | ICD-10-CM | POA: Diagnosis not present

## 2015-10-21 DIAGNOSIS — E559 Vitamin D deficiency, unspecified: Secondary | ICD-10-CM

## 2015-10-21 DIAGNOSIS — I714 Abdominal aortic aneurysm, without rupture, unspecified: Secondary | ICD-10-CM

## 2015-10-21 DIAGNOSIS — Z91199 Patient's noncompliance with other medical treatment and regimen due to unspecified reason: Secondary | ICD-10-CM

## 2015-10-21 DIAGNOSIS — I10 Essential (primary) hypertension: Secondary | ICD-10-CM | POA: Diagnosis not present

## 2015-10-21 DIAGNOSIS — E039 Hypothyroidism, unspecified: Secondary | ICD-10-CM

## 2015-10-21 DIAGNOSIS — K219 Gastro-esophageal reflux disease without esophagitis: Secondary | ICD-10-CM

## 2015-10-21 DIAGNOSIS — D126 Benign neoplasm of colon, unspecified: Secondary | ICD-10-CM

## 2015-10-21 DIAGNOSIS — Z0001 Encounter for general adult medical examination with abnormal findings: Secondary | ICD-10-CM

## 2015-10-21 DIAGNOSIS — I129 Hypertensive chronic kidney disease with stage 1 through stage 4 chronic kidney disease, or unspecified chronic kidney disease: Secondary | ICD-10-CM

## 2015-10-21 DIAGNOSIS — I701 Atherosclerosis of renal artery: Secondary | ICD-10-CM

## 2015-10-21 DIAGNOSIS — K222 Esophageal obstruction: Secondary | ICD-10-CM

## 2015-10-21 DIAGNOSIS — I482 Chronic atrial fibrillation: Secondary | ICD-10-CM | POA: Diagnosis not present

## 2015-10-21 DIAGNOSIS — N184 Chronic kidney disease, stage 4 (severe): Secondary | ICD-10-CM

## 2015-10-21 DIAGNOSIS — I4729 Other ventricular tachycardia: Secondary | ICD-10-CM

## 2015-10-21 DIAGNOSIS — N281 Cyst of kidney, acquired: Secondary | ICD-10-CM

## 2015-10-21 DIAGNOSIS — F411 Generalized anxiety disorder: Secondary | ICD-10-CM

## 2015-10-21 DIAGNOSIS — I25119 Atherosclerotic heart disease of native coronary artery with unspecified angina pectoris: Secondary | ICD-10-CM

## 2015-10-21 DIAGNOSIS — I4821 Permanent atrial fibrillation: Secondary | ICD-10-CM

## 2015-10-21 DIAGNOSIS — E539 Vitamin B deficiency, unspecified: Secondary | ICD-10-CM

## 2015-10-21 DIAGNOSIS — F17200 Nicotine dependence, unspecified, uncomplicated: Secondary | ICD-10-CM

## 2015-10-21 DIAGNOSIS — J449 Chronic obstructive pulmonary disease, unspecified: Secondary | ICD-10-CM

## 2015-10-21 DIAGNOSIS — Z7901 Long term (current) use of anticoagulants: Secondary | ICD-10-CM

## 2015-10-21 DIAGNOSIS — E1122 Type 2 diabetes mellitus with diabetic chronic kidney disease: Secondary | ICD-10-CM

## 2015-10-21 DIAGNOSIS — I739 Peripheral vascular disease, unspecified: Secondary | ICD-10-CM

## 2015-10-21 DIAGNOSIS — Z952 Presence of prosthetic heart valve: Secondary | ICD-10-CM

## 2015-10-21 DIAGNOSIS — E782 Mixed hyperlipidemia: Secondary | ICD-10-CM

## 2015-10-21 DIAGNOSIS — M1000 Idiopathic gout, unspecified site: Secondary | ICD-10-CM

## 2015-10-21 DIAGNOSIS — B372 Candidiasis of skin and nail: Secondary | ICD-10-CM

## 2015-10-21 LAB — BASIC METABOLIC PANEL WITH GFR
BUN: 32 mg/dL — ABNORMAL HIGH (ref 7–25)
CALCIUM: 8.6 mg/dL (ref 8.6–10.3)
CO2: 27 mmol/L (ref 20–31)
CREATININE: 2.06 mg/dL — AB (ref 0.70–1.18)
Chloride: 107 mmol/L (ref 98–110)
GFR, Est African American: 36 mL/min — ABNORMAL LOW (ref >=60)
GFR, Est Non African American: 31 mL/min — ABNORMAL LOW (ref >=60)
GLUCOSE: 193 mg/dL — AB (ref 65–99)
Potassium: 4.2 mmol/L (ref 3.5–5.3)
Sodium: 143 mmol/L (ref 135–146)

## 2015-10-21 LAB — TSH: TSH: 5.897 u[IU]/mL — ABNORMAL HIGH (ref 0.350–4.500)

## 2015-10-21 MED ORDER — CLOTRIMAZOLE-BETAMETHASONE 1-0.05 % EX CREA: 1.0000 "application " | TOPICAL_CREAM | Freq: Two times a day (BID) | CUTANEOUS | Status: DC

## 2015-10-21 NOTE — Progress Notes (Signed)
Complete Physical  Assessment and Plan: 1. Essential hypertension -currently well controlled -cont meds -DASH diet -comanaged with cards  2. Atherosclerosis of native coronary artery of native heart with angina pectoris ([place]) -followed by cards -cont coumadin -cont meds  3. PAD,s/p aorto bifem BG, s/p Rt Fem PTA 3/15 -cont coumadin -cont asa -encouraged to quit smoking  4. Permanent atrial fibrillation ([place]) -followed by cards -cont coumadin -quit smoking  5. Renal artery stenosis (H[facility]) -cont meds -currently well controlled  6. Abdominal aortic aneurysm (AAA) without rupture ([place]) -followed with ultrasounds  7. Peripheral arterial disease (HCC) -cont meds -quit smoking -exercise as often as tolerated  8. 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 Ventricular tachycardia (paroxysmal) (HCC) -resolved, continuing to monitor  10. Chronic obstructive pulmonary disease, unspecified COPD type ([place]) -cont nebs/inhalers -encouraged to quit smoking  11. OSA (obstructive sleep apnea) -cont cpap use  12. Benign neoplasm of colon, unspecified part of colon -monitored with colonoscopy -currently UTD  13. ESOPHAGEAL STRICTURE -followed by endoscopy -followed by GI -cont reflux meds  14. Gastroesophageal reflux disease, esophagitis presence not specified -cont reflux meds  15. Type 2 diabetes mellitus with diabetic chronic kidney disease, unspecified CKD stage, unspecified long term insulin use status ([place]) -currently well controlled -cont meds -diet and exercise  16. Hypothyroidism, unspecified hypothyroidism type -cont levothyroxine  17. Kidney cysts -monitored by intermittent imaging -no intervention currently required  18. Hyperlipidemia -not at goal -cont meds -diet and exercise  19. Idiopathic gout, unspecified chronicity, unspecified site -cont meds -no current flares  20.  Vitamin B deficiency -cont supplement  21. Long term current use of anticoagulant therapy -cont coumadin - Protime-INR  22. Encounter for long-term (current) use of medications   23. Vitamin D deficiency -cont supplement  24. Status post mechanical aortic valve replacement -cont coumadin -followed by cards  25. Generalized anxiety disorder -continue zoloft to 100 mg  26. Poor compliance   27. Tobacco use disorder -not ready to quit  28. Yeast dermatitis Cream sent in, follow up if not better  Discussed med's effects and SE's. Screening labs and tests as requested with regular follow-up as recommended. Over 40 minutes of exam, counseling, chart review and critical decision making was performed  HPI Patient presents for a complete physical.   His blood pressure has been controlled at home, today their BP is BP: 136/80 mmHg  He has CAD, s/p CABG and st Jude AoVR, with chronic afib, on coumadin. Has had TIA in the past and PAD s/p fem pop bypass.  Has COPD and OSA, continues to smoke, states he has painful bumps on back of right head that he gets twice a year, makes it painful to wear CPAP.  He is on 5 mg M,W,F, and 2.5mg  the other 4 days.  He hasn't missed any doses of his coumadin. He hasn't been bleeding at all. No hematochezia, melena, hematuria or nose bleeds. No falls, no hitting his head. No antibiotics recently. Lab Results  Component Value Date   INR 2.21* 09/18/2015   INR 1.68* 08/18/2015   INR 3.96* 07/17/2015   He is on cholesterol medication and denies myalgias. His cholesterol is at goal. The cholesterol last visit was:   Lab Results  Component Value Date   CHOL 185 08/18/2015   HDL 33* 08/18/2015   LDLCALC 107 08/18/2015   TRIG 226* 08/18/2015   CHOLHDL 5.6* 08/18/2015   He has  been working on diet and exercise for prediabetes,  and denies paresthesia of the feet, polydipsia, polyuria and visual disturbances. Last A1C in the office was:  Lab  Results  Component Value Date   HGBA1C 5.9* 08/18/2015    Lab Results  Component Value Date   GFRNONAA 21* 08/18/2015   Patient is on Vitamin D supplement.   Lab Results  Component Value Date   VD25OH 45 08/18/2015     Last PSA was: Lab Results  Component Value Date   PSA 1.16 05/15/2015   BMI is Body mass index is 36.5 kg/(m^2)., he is working on diet and exercise. Wt Readings from Last 3 Encounters:  10/21/15 240 lb (108.863 kg)  09/18/15 238 lb (107.956 kg)  09/03/15 235 lb 6.4 oz (106.777 kg)   He is on thyroid medication. His medication was changed last visit but he is not taking it differently, he is taking it once a day.    Lab Results  Component Value Date   TSH 4.655* 08/18/2015  .     Current Medications:  Current Outpatient Prescriptions on File Prior to Visit  Medication Sig Dispense Refill  . albuterol (PROVENTIL) (2.5 MG/3ML) 0.083% nebulizer solution Take 2.5 mg by nebulization every 6 (six) hours as needed for wheezing or shortness of breath.    . allopurinol (ZYLOPRIM) 300 MG tablet TAKE 1 TABLET BY MOUTH DAILY. TO PREVENT GOUT 90 tablet 1  . amiodarone (PACERONE) 200 MG tablet Take 1 tablet (200 mg total) by mouth daily. 30 tablet 5  . aspirin 81 MG tablet Take 81 mg by mouth daily.    . bumetanide (BUMEX) 2 MG tablet Take 1/2 tablet daily as needed for swelling 30 tablet 6  . Cholecalciferol (VITAMIN D PO) Take 5,000 Units by mouth daily.     . Cyanocobalamin (VITAMIN B 12 PO) Take 1,500 mcg by mouth daily.     . fenofibrate (TRICOR) 145 MG tablet Take 1 tablet daily for Blood Fats 90 tablet 99  . ferrous sulfate 325 (65 FE) MG tablet Take 325 mg by mouth daily.    [name] ipratropium-albuterol (DUONEB) 0.5-2.5 (3) MG/3ML SOLN Inhale 3 mLs into the lungs every 6 (six) hours as needed (shortness of breath).     . isosorbide mononitrate (IMDUR) 30 MG 24 hr tablet TAKE 1 TABLET BY MOUTH EVERY DAY 90 tablet 1  . levothyroxine (SYNTHROID, LEVOTHROID) 50 MCG  tablet TAKE 1 TABLET BY MOUTH EVERY DAY 90 tablet 1  . montelukast (SINGULAIR) 10 MG tablet Take 1 tablet (10 mg total) by mouth daily. 30 tablet 2  . nitroGLYCERIN (NITROSTAT) 0.4 MG SL tablet Place 1 tablet (0.4 mg total) under the tongue every 5 (five) minutes as needed for chest pain. 25 tablet 5  . pravastatin (PRAVACHOL) 40 MG tablet TAKE 1 TABLET (40 MG TOTAL) BY MOUTH EVERY MORNING. 90 tablet 1  . ranitidine (ZANTAC) 300 MG tablet TAKE 1 TABLET (300 MG TOTAL) BY MOUTH AT BEDTIME. 90 tablet 1  . sertraline (ZOLOFT) 50 MG tablet Take 1 tablet (50 mg total) by mouth daily. 30 tablet 2  . tiotropium (SPIRIVA) 18 MCG inhalation capsule Place 18 mcg into inhaler and inhale daily as needed (for shortness of breath).     . verapamil (CALAN) 80 MG tablet TAKE 1 TABLET (80 MG TOTAL) BY MOUTH 2 (TWO) TIMES DAILY. 180 tablet 1  . warfarin (COUMADIN) 5 MG tablet Take 0.5-1 tablets (2.5-5 mg total) by mouth daily. Take 2.5mg  on Monday  and Friday.  5mg  all other days of the week. (Patient taking differently: Take 2.5-5 mg by mouth daily. ) 90 tablet 3   No current facility-administered medications on file prior to visit.   Health Maintenance:  Immunization History  Administered Date(s) Administered  . DT 01/25/2012  . Influenza Split 06/28/2013  . Influenza, High Dose Seasonal PF 08/02/2014, 07/17/2015  . Pneumococcal Conjugate-13 03/25/2014  . Pneumococcal Polysaccharide-23 08/11/2012  . Zoster 10/30/2010    Tetanus: 2013 Pneumovax: 2013 Prevnar 13: 2015 Flu vaccine: 2016 Zostavax: 2012 Colonoscopy: 2008 EGD: 2014  Stress test 2012 Echo 05/2015 VAS [place] 08/2015 Renal US 04/2013 Aorta US 08/2015 CXR 05/2015  Patient Care Team: Unk Pinto, MD as PCP - General (Internal Medicine) Pixie Casino, MD as Consulting Physician (Cardiology) Inda Castle, MD as Consulting Physician (Gastroenterology) Lorretta Harp, MD as Consulting Physician (Cardiology) Elmarie Shiley, MD as  Consulting Physician (Nephrology) Rana Snare, MD as Consulting Physician (Urology)  Allergies:  Allergies  Allergen Reactions  . Chantix [Varenicline] Nausea And Vomiting  . Lyrica [Pregabalin]     Swelling, couldn't breathe  . Nsaids Other (See Comments)    Unknown   . Simvastatin     Pt said he had a "bleed out" after taking it  . Ziac [Bisoprolol-Hydrochlorothiazide]     fatigue   Medical History:  Past Medical History  Diagnosis Date  . Mechanical heart valve present     a. Hx mechanical St Jude AVR 2000.  . Cataracts, bilateral   . OSA (obstructive sleep apnea)   . Permanent atrial fibrillation ([place])   . Ischemic cardiomyopathy   . History of echocardiogram 04/17/2013    EF 40-45%; LV hypertrophy; LV mild-mod dilated; AV regurg.; LA severely diated, RA mildly dilated  . History of Doppler ultrasound 04/05/2013    LEAs; small distal abd. aoritc aneuysm is stable; fem-pop graft to R leg has 50-69% blockage  . History of nuclear stress test 05/14/2011    Dipyridamole; moderate perfusion defect in Basal Infrioer & Mid Inferior region - consistent w/infarct or scar; global LV systolic function mildly reduced; EKG negative for ischemia; low risk scan  . COPD (chronic obstructive pulmonary disease) ([place])   . Hyperlipidemia   . Hypertension   . Kidney cysts   . CKD (chronic kidney disease), stage III   . Hiatal hernia   . GERD (gastroesophageal reflux disease)   . Prediabetes   . Gout   . AAA (abdominal aortic aneurysm) ([place])   . Vitamin B deficiency   . Peripheral arterial disease ([place])     a. s/p aorto bifem BG;  b. 11/2013 PVA distal R Fem-fem stenosis;  c. 12/2013 PTA of dist R femoral bypass graft.  [name] CAD (coronary artery disease)     a. CABG '00. b. all SVGs occluded 2010. c. low risk Nuc 2012   . Chronic anticoagulation   . Ventricular tachycardia (paroxysmal) ([place])     Seen by Dr. Lovena Le with EP, started on amiodarone   Surgical History:  Past Surgical History   Procedure Laterality Date  . Stents in kidneys    . Coronary artery bypass graft  2000    3 vessel; LIMA to LAD; RCA, OM  . Shunts in femoral arteries    . Cardioversion  8/282012  . Aortic valve replacement      St. Jude  . Femoral bypass    . Cardiac catheterization  11/25/2008    loss of 2/3 (RCA, OM) bypass grafts  with patent internal mammary artery to LAD; large collateral filling of RCA ; osteal narrowing of circumflex of ~50%  . Lower extremity angiogram N/A 12/03/2013    Procedure: LOWER EXTREMITY ANGIOGRAM;  Surgeon: Lorretta Harp, MD;  [facility];  Service: Cardiovascular;  Laterality: N/A;  . Lower extremity angiogram N/A 01/07/2014    Procedure: LOWER EXTREMITY ANGIOGRAM;  Surgeon: Lorretta Harp, MD;  Location: [facility] CATH LAB;  Service: Cardiovascular;  Laterality: N/A;   Family History:  Family History  Problem Relation Age of Onset  . Heart attack Father   . Heart attack Mother   . Lung cancer Sister     x 2  . Kidney cancer Sister   . Kidney disease Sister   . Cancer Brother     ?  . Stroke Brother    Social History:   Social History  Substance Use Topics  . Smoking status: Current Every Day Smoker -- 0.05 packs/day for 40 years    Types: Cigarettes  . Smokeless tobacco: Never Used  . Alcohol Use: No   Review of Systems:  Review of Systems  Constitutional: Negative.   HENT: Negative.   Respiratory: Positive for cough and shortness of breath (unchanged). Negative for hemoptysis, sputum production and wheezing.   Cardiovascular: Negative.   Gastrointestinal: Negative.   Genitourinary: Negative.   Musculoskeletal: Negative.   Skin: Positive for rash.  Neurological: Negative.   Psychiatric/Behavioral: Negative.     Physical Exam: Estimated body mass index is 36.5 kg/(m^2) as calculated from the following:   Height as of this encounter: 5\' 8"  (1.727 m).   Weight as of this encounter: 240 lb (108.863 kg). BP 136/80 mmHg  Pulse 71   Temp(Src) 98.2 F (36.8 C) (Temporal)  Resp 14  Ht 5\' 8"  (1.727 m)  Wt 240 lb (108.863 kg)  BMI 36.50 kg/m2  SpO2 98% General Appearance: in no apparent distress.  Eyes: PERRLA, EOMs, conjunctiva no swelling or erythema, normal fundi and vessels.  Sinuses: No Frontal/maxillary tenderness  ENT/Mouth: Ext aud canals clear, normal light reflex with TMs without erythema, bulging.  No erythema, swelling, or exudate on post pharynx. Tonsils not swollen or erythematous. Hearing decreased, has hearing aid left ear.   Neck: Supple, thyroid normal. No masses Respiratory: Respiratory effort normal, diffuse decreased breath sounds without rhonchi, wheezing or stridor.  Cardio: Irreg, irreg with mechanical click without rubs or gallops. DP/PT palpable bilateral, no ulcers.  Chest: symmetric, with normal excursions and percussion.  Abdomen: Soft, nontender, no guarding, rebound, hernias, masses, or organomegaly.  Lymphatics: Non tender without lymphadenopathy.  Genitourinary: defer Musculoskeletal: Full ROM all peripheral extremities,4/5 strength, and normal gait.  Skin: Along posterior hair fold with erythema, well demarcated, has ulceration along right side, no warmth, tenderness, discharge.  Warm, dry without rashes, lesions, ecchymosis. Neuro: Cranial nerves intact, reflexes equal bilaterally. Normal muscle tone, no cerebellar symptoms. Psych: Awake and oriented X 3, normal affect, Insight and Judgment appropriate.   EKG: defer cardio AORTA SCAN: defer cardio  Vicie Mutters 11:28 AM [facility]

## 2015-10-22 LAB — PROTIME-INR
INR: 2.66 — AB (ref <1.50)
PROTHROMBIN TIME: 28.7 s — AB (ref 11.6–15.2)

## 2015-10-23 DIAGNOSIS — G4733 Obstructive sleep apnea (adult) (pediatric): Secondary | ICD-10-CM | POA: Diagnosis not present

## 2015-10-27 ENCOUNTER — Ambulatory Visit: Payer: PPO | Admitting: Internal Medicine

## 2015-11-20 ENCOUNTER — Encounter: Payer: Self-pay | Admitting: Internal Medicine

## 2015-11-20 ENCOUNTER — Ambulatory Visit (INDEPENDENT_AMBULATORY_CARE_PROVIDER_SITE_OTHER): Payer: PPO | Admitting: Internal Medicine

## 2015-11-20 VITALS — BP 100/56 | HR 55 | Ht 69.0 in | Wt 231.7 lb

## 2015-11-20 DIAGNOSIS — I482 Chronic atrial fibrillation: Secondary | ICD-10-CM

## 2015-11-20 DIAGNOSIS — N184 Chronic kidney disease, stage 4 (severe): Secondary | ICD-10-CM

## 2015-11-20 DIAGNOSIS — E1122 Type 2 diabetes mellitus with diabetic chronic kidney disease: Secondary | ICD-10-CM

## 2015-11-20 DIAGNOSIS — I25110 Atherosclerotic heart disease of native coronary artery with unstable angina pectoris: Secondary | ICD-10-CM

## 2015-11-20 DIAGNOSIS — I129 Hypertensive chronic kidney disease with stage 1 through stage 4 chronic kidney disease, or unspecified chronic kidney disease: Secondary | ICD-10-CM

## 2015-11-20 DIAGNOSIS — I739 Peripheral vascular disease, unspecified: Secondary | ICD-10-CM | POA: Diagnosis not present

## 2015-11-20 DIAGNOSIS — I4821 Permanent atrial fibrillation: Secondary | ICD-10-CM

## 2015-11-20 NOTE — Patient Instructions (Signed)
Dr Hilty recommends that you schedule a follow-up appointment in 6 months. You will receive a reminder letter in the mail two months in advance. If you don't receive a letter, please call our office to schedule the follow-up appointment.  If you need a refill on your cardiac medications before your next appointment, please call your pharmacy. 

## 2015-11-20 NOTE — Progress Notes (Signed)
OFFICE NOTE  Chief Complaint:  Hospital follow up  Primary Care Physician: Alesia Richards, MD  HPI:  Robert Stanton is a 75 year old gentleman with a history of CABG in 2000 with a LIMA to the LAD. All vein grafts were noted to be occluded, and he had a St. Jude AVR which is notably functioning. EF is about 40-45% in 2012. He also has sleep apnea, COPD, and recurrent atrial fibrillation/flutter. He had cardioversion in 2012 but is back in atrial fibrillation, and I started him on amiodarone with the plan of cardioversion. He at his last visit actually declined cardioversion, however, we have kept him on amiodarone because it has seemed to benefit him as far as reducing multiple PVCs which were previously noted. Today he continues to have sinus bradycardia despite decreasing his Cardizem at the last visit from 180 mg to 120 mg daily. The heart rate remains around 48 with a significant intraventricular conduction delay suggesting underlying conduction disease. He reports a recent gout attack, and was given the lower. He's also had significant weight gain and lower extremity fluid retention. This could be due to worsening heart failure. He continues to be bradycardic, with interventricular conduction delay. I've been decreasing his amiodarone intake is about time that we discontinue it.  At his last office visit, I discontinued his amiodarone. We also increased his Bumex slightly and obtain an echocardiogram. The echocardiogram showed a stable EF of 40-45% with some mildly elevated filling pressures. Overall, there was no significant change from the prior study. 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 The renal cortex and medulla appeared bilaterally without hydronephrosis.  He does report some improvement in his swelling with the increased dose of Bumex. However, his shortness of breath is fairly stable without improvement.  He has  noted a sore that developed between the right fourth and fifth digits on the right foot. This appears to be an arterial ulcer. He did have arterial Dopplers performed in February of 2013. This showed a reduced ABI of 0.75 on the right and 1.1 on the left. The right SFA is occluded and the left SFA is occluded. There is a patent right to left fem-fem bypass graft. Right runoff was noted to be poor.  Mr. Lenton follows up today from hospital. He was having some chest discomfort and was admitted this past summer with a wide complex tachycardia which is likely VT. He was seen by EP and not thought to be candidate for ablation or defibrillator due to multiple other medical problems, significant PAD and coronary artery disease with all of his bypass grafts which were occluded in 2010. This is wise not had further ischemic workup in addition he has significant chronic kidney disease. He was placed on amiodarone and this seems to have quieted some of those symptoms. He's maintaining atrial fibrillation, however which is likely permanent. He has some occasional chest discomfort which is relieved by nitroglycerin. Again I think his options are fairly limited with regards to revascularization given his poor renal function and other medical problems. He has had significant PAD however recently had repeat Dopplers which showed only about 30-40% stenosis which was a vessel that was intervened upon by Dr. Gwenlyn Found about 2 years ago.  PMHx:  Past Medical History  Diagnosis Date  . Mechanical heart valve present     a. Hx mechanical St Jude AVR 2000.  . Cataracts, bilateral   . OSA (obstructive sleep apnea)   .  Permanent atrial fibrillation ([place])   . Ischemic cardiomyopathy   . History of echocardiogram 04/17/2013    EF 40-45%; LV hypertrophy; LV mild-mod dilated; AV regurg.; LA severely diated, RA mildly dilated  . History of Doppler ultrasound 04/05/2013    LEAs; small distal abd. aoritc aneuysm is stable; fem-pop  graft to R leg has 50-69% blockage  . History of nuclear stress test 05/14/2011    Dipyridamole; moderate perfusion defect in Basal Infrioer & Mid Inferior region - consistent w/infarct or scar; global LV systolic function mildly reduced; EKG negative for ischemia; low risk scan  . COPD (chronic obstructive pulmonary disease) ([place])   . Hyperlipidemia   . Hypertension   . Kidney cysts   . CKD (chronic kidney disease), stage III   . Hiatal hernia   . GERD (gastroesophageal reflux disease)   . Prediabetes   . Gout   . AAA (abdominal aortic aneurysm) ([place])   . Vitamin B deficiency   . Peripheral arterial disease ([place])     a. s/p aorto bifem BG;  b. 11/2013 PVA distal R Fem-fem stenosis;  c. 12/2013 PTA of dist R femoral bypass graft.  [name] CAD (coronary artery disease)     a. CABG '00. b. all SVGs occluded 2010. c. low risk Nuc 2012   . Chronic anticoagulation   . Ventricular tachycardia (paroxysmal) (HCC)     Seen by Dr. Lovena Le with EP, started on amiodarone    Past Surgical History  Procedure Laterality Date  . Stents in kidneys    . Coronary artery bypass graft  2000    3 vessel; LIMA to LAD; RCA, OM  . Shunts in femoral arteries    . Cardioversion  8/282012  . Aortic valve replacement      St. Jude  . Femoral bypass    . Cardiac catheterization  11/25/2008    loss of 2/3 (RCA, OM) bypass grafts with patent internal mammary artery to LAD; large collateral filling of RCA ; osteal narrowing of circumflex of ~50%  . Lower extremity angiogram N/A 12/03/2013    Procedure: LOWER EXTREMITY ANGIOGRAM;  Surgeon: Lorretta Harp, MD;  Location[facility] CATH LAB;  Service: Cardiovascular;  Laterality: N/A;  . Lower extremity angiogram N/A 01/07/2014    Procedure: LOWER EXTREMITY ANGIOGRAM;  Surgeon: Lorretta Harp, MD;  Location: [facility] CATH LAB;  Service: Cardiovascular;  Laterality: N/A;    FAMHx:  Family History  Problem Relation Age of Onset  . Heart attack Father   . Heart attack Mother   .  Lung cancer Sister     x 2  . Kidney cancer Sister   . Kidney disease Sister   . Cancer Brother     ?  . Stroke Brother     SOCHx:   reports that he has been smoking Cigarettes.  He has a 2 pack-year smoking history. He has never used smokeless tobacco. He reports that he does not drink alcohol or use illicit drugs.  ALLERGIES:  Allergies  Allergen Reactions  . Chantix [Varenicline] Nausea And Vomiting  . Lyrica [Pregabalin]     Swelling, couldn't breathe  . Nsaids Other (See Comments)    Unknown   . Simvastatin     Pt said he had a "bleed out" after taking it  . Ziac [Bisoprolol-Hydrochlorothiazide]     fatigue    ROS: A comprehensive review of systems was negative except for: Cardiovascular: positive for chest pain  HOME MEDS: Current Outpatient Prescriptions  Medication  Sig Dispense Refill  . albuterol (PROVENTIL) (2.5 MG/3ML) 0.083% nebulizer solution Take 2.5 mg by nebulization every 6 (six) hours as needed for wheezing or shortness of breath.    . allopurinol (ZYLOPRIM) 300 MG tablet TAKE 1 TABLET BY MOUTH DAILY. TO PREVENT GOUT 90 tablet 1  . amiodarone (PACERONE) 200 MG tablet Take 1 tablet (200 mg total) by mouth daily. 30 tablet 5  . aspirin 81 MG tablet Take 81 mg by mouth daily.    . bumetanide (BUMEX) 2 MG tablet Take 1/2 tablet daily as needed for swelling 30 tablet 6  . Cholecalciferol (VITAMIN D PO) Take 5,000 Units by mouth daily.     . clotrimazole-betamethasone (LOTRISONE) cream Apply 1 application topically 2 (two) times daily. 15 g 2  . Cyanocobalamin (VITAMIN B 12 PO) Take 1,500 mcg by mouth daily.     . fenofibrate (TRICOR) 145 MG tablet Take 1 tablet daily for Blood Fats 90 tablet 99  . ferrous sulfate 325 (65 FE) MG tablet Take 325 mg by mouth daily.    [name] ipratropium-albuterol (DUONEB) 0.5-2.5 (3) MG/3ML SOLN Inhale 3 mLs into the lungs every 6 (six) hours as needed (shortness of breath).     . isosorbide mononitrate (IMDUR) 30 MG 24 hr tablet TAKE  1 TABLET BY MOUTH EVERY DAY 90 tablet 1  . levothyroxine (SYNTHROID, LEVOTHROID) 50 MCG tablet TAKE 1 TABLET BY MOUTH EVERY DAY 90 tablet 1  . montelukast (SINGULAIR) 10 MG tablet Take 1 tablet (10 mg total) by mouth daily. 30 tablet 2  . pravastatin (PRAVACHOL) 40 MG tablet TAKE 1 TABLET (40 MG TOTAL) BY MOUTH EVERY MORNING. 90 tablet 1  . ranitidine (ZANTAC) 300 MG tablet TAKE 1 TABLET (300 MG TOTAL) BY MOUTH AT BEDTIME. 90 tablet 1  . sertraline (ZOLOFT) 50 MG tablet Take 1 tablet (50 mg total) by mouth daily. 30 tablet 2  . tiotropium (SPIRIVA) 18 MCG inhalation capsule Place 18 mcg into inhaler and inhale daily as needed (for shortness of breath).     . verapamil (CALAN) 80 MG tablet TAKE 1 TABLET (80 MG TOTAL) BY MOUTH 2 (TWO) TIMES DAILY. 180 tablet 1  . warfarin (COUMADIN) 5 MG tablet Take 0.5-1 tablets (2.5-5 mg total) by mouth daily. Take 2.5mg  on Monday and Friday.  5mg  all other days of the week. (Patient taking differently: Take 2.5-5 mg by mouth daily. ) 90 tablet 3   No current facility-administered medications for this visit.    LABS/IMAGING: No results found for this or any previous visit (from the past 48 hour(s)). No results found.  VITALS: BP 100/56 mmHg  Pulse 55  Ht 5\' 9"  (1.753 m)  Wt 231 lb 11.2 oz (105.098 kg)  BMI 34.20 kg/m2  EXAM: Atrial fibrillation with slow ventricular response at 57  EKG: deferred  ASSESSMENT: 1. Atrial fibrillation with slow ventricular response 2. Weight gain and lower extremity edema 3. Ischemic cardiomyopathy EF 40-45% - status post CABG with occluded vein grafts and 2010 4. Aortic stenosis status post St. Jude aortic valve replacement 5. 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 Large bilateral femoropopliteal grafts, without any sign of aneurysm, and preserved ABIs bilaterally 8. Chronic kidney disease stage 3-4 9. 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   Mr. Tinson had sustained VT this past summer was placed on amiodarone for medical therapy. He  was seen by cardiac Wynetta Emery physiology and felt not to be candidate for ablation nor a candidate for AICD. He intermittently has some anginal symptoms when he pushes hard and this we are managing medically as he is not a candidate for intervention given his significant chronic kidney disease. Will continue amiodarone and he will need regular follow-up of thyroid function and pulmonary function as well as liver enzymes. He has an appointment with his primary care provider tomorrow who will be doing this lab work.  No changes to his medications at this time. Plan to see him back in 6 months.  Pixie Casino, MD, [facility] Attending Cardiologist [place] 11/20/2015, 1:56 PM

## 2015-11-21 ENCOUNTER — Other Ambulatory Visit: Payer: Self-pay | Admitting: Internal Medicine

## 2015-11-21 ENCOUNTER — Ambulatory Visit (INDEPENDENT_AMBULATORY_CARE_PROVIDER_SITE_OTHER): Payer: PPO | Admitting: Internal Medicine

## 2015-11-21 ENCOUNTER — Encounter: Payer: Self-pay | Admitting: Internal Medicine

## 2015-11-21 VITALS — BP 132/74 | HR 60 | Temp 97.7°F | Resp 16 | Ht 68.0 in | Wt 233.8 lb

## 2015-11-21 DIAGNOSIS — M1000 Idiopathic gout, unspecified site: Secondary | ICD-10-CM

## 2015-11-21 DIAGNOSIS — Z79899 Other long term (current) drug therapy: Secondary | ICD-10-CM

## 2015-11-21 DIAGNOSIS — M109 Gout, unspecified: Secondary | ICD-10-CM | POA: Diagnosis not present

## 2015-11-21 DIAGNOSIS — E559 Vitamin D deficiency, unspecified: Secondary | ICD-10-CM | POA: Diagnosis not present

## 2015-11-21 DIAGNOSIS — E782 Mixed hyperlipidemia: Secondary | ICD-10-CM

## 2015-11-21 DIAGNOSIS — E1122 Type 2 diabetes mellitus with diabetic chronic kidney disease: Secondary | ICD-10-CM

## 2015-11-21 DIAGNOSIS — E1129 Type 2 diabetes mellitus with other diabetic kidney complication: Secondary | ICD-10-CM | POA: Diagnosis not present

## 2015-11-21 DIAGNOSIS — I10 Essential (primary) hypertension: Secondary | ICD-10-CM

## 2015-11-21 DIAGNOSIS — N182 Chronic kidney disease, stage 2 (mild): Secondary | ICD-10-CM | POA: Diagnosis not present

## 2015-11-21 DIAGNOSIS — Z7901 Long term (current) use of anticoagulants: Secondary | ICD-10-CM | POA: Diagnosis not present

## 2015-11-21 DIAGNOSIS — E039 Hypothyroidism, unspecified: Secondary | ICD-10-CM

## 2015-11-21 LAB — BASIC METABOLIC PANEL WITH GFR
BUN: 40 mg/dL — ABNORMAL HIGH (ref 7–25)
CHLORIDE: 100 mmol/L (ref 98–110)
CO2: 26 mmol/L (ref 20–31)
CREATININE: 2.78 mg/dL — AB (ref 0.70–1.18)
Calcium: 8.8 mg/dL (ref 8.6–10.3)
GFR, Est African American: 25 mL/min — ABNORMAL LOW (ref >=60)
GFR, Est Non African American: 22 mL/min — ABNORMAL LOW (ref >=60)
Glucose, Bld: 155 mg/dL — ABNORMAL HIGH (ref 65–99)
POTASSIUM: 4.5 mmol/L (ref 3.5–5.3)
SODIUM: 138 mmol/L (ref 135–146)

## 2015-11-21 LAB — HEPATIC FUNCTION PANEL
ALK PHOS: 57 U/L (ref 40–115)
ALT: 20 U/L (ref 9–46)
AST: 25 U/L (ref 10–35)
Albumin: 3.9 g/dL (ref 3.6–5.1)
BILIRUBIN DIRECT: 0.2 mg/dL (ref <=0.2)
BILIRUBIN TOTAL: 0.5 mg/dL (ref 0.2–1.2)
Indirect Bilirubin: 0.3 mg/dL (ref 0.2–1.2)
Total Protein: 7.2 g/dL (ref 6.1–8.1)

## 2015-11-21 LAB — CBC WITH DIFFERENTIAL/PLATELET
BASOS ABS: 0.1 10*3/uL (ref 0.0–0.1)
Basophils Relative: 1 % (ref 0–1)
EOS ABS: 0.2 10*3/uL (ref 0.0–0.7)
EOS PCT: 3 % (ref 0–5)
HCT: 35.2 % — ABNORMAL LOW (ref 39.0–52.0)
HEMOGLOBIN: 11 g/dL — AB (ref 13.0–17.0)
LYMPHS ABS: 1.4 10*3/uL (ref 0.7–4.0)
Lymphocytes Relative: 20 % (ref 12–46)
MCH: 29.5 pg (ref 26.0–34.0)
MCHC: 31.3 g/dL (ref 30.0–36.0)
MCV: 94.4 fL (ref 78.0–100.0)
MONO ABS: 0.6 10*3/uL (ref 0.1–1.0)
MONOS PCT: 8 % (ref 3–12)
MPV: 10.9 fL (ref 8.6–12.4)
Neutro Abs: 4.8 10*3/uL (ref 1.7–7.7)
Neutrophils Relative %: 68 % (ref 43–77)
PLATELETS: 244 10*3/uL (ref 150–400)
RBC: 3.73 MIL/uL — ABNORMAL LOW (ref 4.22–5.81)
RDW: 15.3 % (ref 11.5–15.5)
WBC: 7.1 10*3/uL (ref 4.0–10.5)

## 2015-11-21 LAB — URIC ACID: URIC ACID, SERUM: 7.1 mg/dL (ref 4.0–7.8)

## 2015-11-21 LAB — HEMOGLOBIN A1C
HEMOGLOBIN A1C: 6.1 % — AB (ref <5.7)
Mean Plasma Glucose: 128 mg/dL — ABNORMAL HIGH (ref <117)

## 2015-11-21 LAB — LIPID PANEL
CHOL/HDL RATIO: 4.7 ratio (ref <=5.0)
Cholesterol: 156 mg/dL (ref 125–200)
HDL: 33 mg/dL — AB (ref >=40)
LDL CALC: 94 mg/dL (ref <130)
Triglycerides: 146 mg/dL (ref <150)
VLDL: 29 mg/dL (ref <30)

## 2015-11-21 LAB — TSH: TSH: 4.329 u[IU]/mL (ref 0.350–4.500)

## 2015-11-21 LAB — MAGNESIUM: Magnesium: 2 mg/dL (ref 1.5–2.5)

## 2015-11-21 NOTE — Progress Notes (Signed)
Patient ID: Robert Stanton, male   DOB: 19-May-1941, 75 y.o.   MRN: [id number]   This very nice 75 y.o. MWM presents for quarterly follow up with Hypertension, ASCAD, ASPVD Hyperlipidemia, Pre-Diabetes and Vitamin D Deficiency. Patient also has Gout, COPD and OSA/CPAP.    Patient is treated for HTN & BP has been controlled at home. Today's BP: 132/74 mmHg. Patient is followed by Dr [name] - s/p CABG and [facility] in 2000 and is on coumadin since for Afib. Patient has had no complaints of any cardiac type chest pain, palpitations, dyspnea/orthopnea/PND, dizziness, claudication, or dependent edema. He has ASPVD & is s/p AFBPG and FPGBG. He continues to smoke, but denies that he has a Death Wish.   Hyperlipidemia is not controlled with his non compliant  diet & meds. Patient denies myalgias or other med SE's. Last Lipids were not at goal with& elevated  Cholesterol 185; HDL 33*; LDL 107; Triglycerides 226* on 08/18/2015.    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  Last A1c was 5.9% on 08/18/2015.   Further, the patient also has history of Vitamin D Deficiency of "22" in 2009 and supplements vitamin D without any suspected side-effects. Last vitamin D was 45 on 08/18/2015.  Medication Sig  . albuterol  (2.5 MG/3ML) 0.083% neb soln Take 2.5 mg by nebulization every 6 (six) hours as needed for wheezing or shortness of breath.  . allopurinol  300 MG  TAKE 1 TABLET BY MOUTH DAILY. TO PREVENT GOUT  . amiodarone  200 MG  Take 1 tablet (200 mg total) by mouth daily.  [name] aspirin 81 MG  Take 81 mg by mouth daily.  . bumetanide (BUMEX) 2 MG  Take 1/2 tablet daily as needed for swelling  . VITAMIN D  Take 5,000 Units by mouth daily.   [name] LOTRISONE crm Apply 1 application topically 2 (two) times daily.  [name] VITAMIN B 12 PO  Take 1,500 mcg by mouth daily.   . fenofibrate =145 MG  Take 1 tablet daily for Blood Fats  . ferrous sulfate 325 (65 FE) MG = Take 325 mg by mouth daily.  . DUONEB 0.5-2.5 MG/3ML SOLN Inhale 3 mLs into the lungs every 6 (six) hours as needed (shortness of breath).   . IMDUR 30 MG 24 hr  TAKE 1 TABLET BY MOUTH EVERY DAY  . levothyroxine  50 MCG  TAKE 1 TABLET BY MOUTH EVERY DAY  . montelukast  10 MG  Take 1 tablet (10 mg total) by mouth daily.  . pravastatin  40 MG TAKE 1 TABLET (40 MG TOTAL) BY MOUTH EVERY MORNING.  . ranitidine  300 MG TAKE 1 TABLET (300 MG TOTAL) BY MOUTH AT BEDTIME.  [name] sertraline  50 MG  Take 1 tablet (50 mg total) by mouth daily.  [name] tiotropium (SPIRIVA) inhalation cap Place 18 mcg into inhaler and inhale daily as needed (for shortness of breath).   . verapamil  80 MG  TAKE 1 TABLET (80 MG TOTAL) BY MOUTH 2 (TWO) TIMES DAILY.  [name] warfarin  5 MG  Takes 5 mg x 3 da MWF & 2.5 mg x 4 da TThSS   Allergies  Allergen Reactions  . Chantix [Varenicline] Nausea And Vomiting  . Lyrica [Pregabalin]     Swelling, couldn't breathe  .  Nsaids Other (See Comments)    Unknown   . Simvastatin     Pt said he had a "bleed out" after taking it  . Ziac [Bisoprolol-Hydrochlorothiazide]     fatigue   PMHx:   Past Medical History  Diagnosis Date  . Mechanical heart valve present     a. Hx mechanical St Jude AVR 2000.  . Cataracts, bilateral   . OSA (obstructive sleep apnea)   . Permanent atrial fibrillation ([place])   . Ischemic cardiomyopathy   . History of echocardiogram 04/17/2013    EF 40-45%; LV hypertrophy; LV mild-mod dilated; AV regurg.; LA severely diated, RA mildly dilated  . History of Doppler ultrasound 04/05/2013    LEAs; small distal abd. aoritc aneuysm is stable; fem-pop graft to R leg has 50-69% blockage  . History of nuclear stress test 05/14/2011    Dipyridamole; moderate perfusion defect in Basal Infrioer & Mid Inferior region - consistent w/infarct or scar; global LV  systolic function mildly reduced; EKG negative for ischemia; low risk scan  . COPD (chronic obstructive pulmonary disease) ([place])   . Hyperlipidemia   . Hypertension   . Kidney cysts   . CKD (chronic kidney disease), stage III   . Hiatal hernia   . GERD (gastroesophageal reflux disease)   . Prediabetes   . Gout   . AAA (abdominal aortic aneurysm) ([place])   . Vitamin B deficiency   . Peripheral arterial disease ([place])     a. s/p aorto bifem BG;  b. 11/2013 PVA distal R Fem-fem stenosis;  c. 12/2013 PTA of dist R femoral bypass graft.  [name] CAD (coronary artery disease)     a. CABG '00. b. all SVGs occluded 2010. c. low risk Nuc 2012   . Chronic anticoagulation   . Ventricular tachycardia (paroxysmal) ([place])     Seen by Dr. Lovena Le with EP, started on amiodarone   Immunization History  Administered Date(s) Administered  . DT 01/25/2012  . Influenza Split 06/28/2013  . Influenza, High Dose Seasonal PF 08/02/2014, 07/17/2015  . Pneumococcal Conjugate-13 03/25/2014  . Pneumococcal Polysaccharide-23 08/11/2012  . Zoster 10/30/2010   Past Surgical History  Procedure Laterality Date  . Stents in kidneys    . Coronary artery bypass graft  2000    3 vessel; LIMA to LAD; RCA, OM  . Shunts in femoral arteries    . Cardioversion  8/282012  . Aortic valve replacement      St. Jude  . Femoral bypass    . Cardiac catheterization  11/25/2008    loss of 2/3 (RCA, OM) bypass grafts with patent internal mammary artery to LAD; large collateral filling of RCA ; osteal narrowing of circumflex of ~50%  . Lower extremity angiogram N/A 12/03/2013    Procedure: LOWER EXTREMITY ANGIOGRAM;  Surgeon: Lorretta Harp, MD;  Location[facility] CATH LAB;  Service: Cardiovascular;  Laterality: N/A;  . Lower extremity angiogram N/A 01/07/2014    Procedure: LOWER EXTREMITY ANGIOGRAM;  Surgeon: Lorretta Harp, MD;  Location: [facility] CATH LAB;  Service: Cardiovascular;  Laterality: N/A;   FHx:    Reviewed / unchanged  SHx:     Reviewed / unchanged  Systems Review:  Constitutional: Denies fever, chills, wt changes, headaches, insomnia, fatigue, night sweats, change in appetite. Eyes: Denies redness, blurred vision, diplopia, discharge, itchy, watery eyes.  ENT: Denies discharge, congestion, post nasal drip, epistaxis, sore throat, earache, hearing loss, dental pain, tinnitus, vertigo, sinus pain, snoring.  CV: Denies chest pain, palpitations, irregular  heartbeat, syncope, dyspnea, diaphoresis, orthopnea, PND, claudication or edema. Respiratory: denies cough, dyspnea, DOE, pleurisy, hoarseness, laryngitis, wheezing.  Gastrointestinal: Denies dysphagia, odynophagia, heartburn, reflux, water brash, abdominal pain or cramps, nausea, vomiting, bloating, diarrhea, constipation, hematemesis, melena, hematochezia  or hemorrhoids. Genitourinary: Denies dysuria, frequency, urgency, nocturia, hesitancy, discharge, hematuria or flank pain. Musculoskeletal: Denies arthralgias, myalgias, stiffness, jt. swelling, pain, limping or strain/sprain.  Skin: Denies pruritus, rash, hives, warts, acne, eczema or change in skin lesion(s). Neuro: No weakness, tremor, incoordination, spasms, paresthesia or pain. Psychiatric: Denies confusion, memory loss or sensory loss. Endo: Denies change in weight, skin or hair change.  Heme/Lymph: No excessive bleeding, bruising or enlarged lymph nodes.  Physical Exam  BP 132/74 mmHg  Pulse 60  Temp(Src) 97.7 F (36.5 C)  Resp 16  Ht 5\' 8"  (1.727 m)  Wt 233 lb 12.8 oz (106.051 kg)  BMI 35.56 kg/m2  Appears well nourished and in no distress. Eyes: PERRLA, EOMs, conjunctiva no swelling or erythema. Sinuses: No frontal/maxillary tenderness ENT/Mouth: EAC's clear, TM's nl w/o erythema, bulging. Nares clear w/o erythema, swelling, exudates. Oropharynx clear without erythema or exudates. Oral hygiene is good. Tongue normal, non obstructing. Hearing intact.  Neck: Supple. Thyroid nl. Car 2+/2+ without  bruits, nodes or JVD. Chest: Respirations nl with BS clear & equal w/o rales, rhonchi, wheezing or stridor.  Cor: Heart sounds normal w/ regular rate and rhythm without sig. murmurs, gallops, clicks, or rubs. Peripheral pulses normal and equal  without edema.  Abdomen: Soft & bowel sounds normal. Non-tender w/o guarding, rebound, hernias, masses, or organomegaly.  Lymphatics: Unremarkable.  Musculoskeletal: Full ROM all peripheral extremities, joint stability, 5/5 strength, and normal gait.  Skin: Warm, dry without exposed rashes, lesions or ecchymosis apparent.  Neuro: Cranial nerves intact, reflexes equal bilaterally. Sensory-motor testing grossly intact. Tendon reflexes grossly intact.  Pysch: Alert & oriented x 3.  Insight and judgement nl & appropriate. No ideations.  Assessment and Plan:  1. Essential hypertension  - TSH  2. Hyperlipidemia  - Lipid panel - TSH  3. Type 2 diabetes mellitus with stage 2 chronic kidney disease, without long-term current use of insulin (HCC)  - Hemoglobin A1c - Insulin, random  4. Vitamin D deficiency  - VITAMIN D 25 Hydroxy   5. Hypothyroidism   6. Idiopathic gout  - Uric acid  7. Long term current use of anticoagulant therapy  - Protime-INR  8. Medication management  - CBC with Differential/Platelet - BASIC METABOLIC PANEL WITH GFR - Hepatic function panel - Magnesium   Recommended regular exercise, BP monitoring, weight control, and discussed med and SE's. Recommended labs to assess and monitor clinical status. Further disposition pending results of labs. Over 30 minutes of exam, counseling, chart review was performed

## 2015-11-21 NOTE — Patient Instructions (Signed)

## 2015-11-22 LAB — VITAMIN D 25 HYDROXY (VIT D DEFICIENCY, FRACTURES): Vit D, 25-Hydroxy: 60 ng/mL (ref 30–100)

## 2015-11-22 LAB — PROTIME-INR
INR: 2.35 — ABNORMAL HIGH (ref <1.50)
PROTHROMBIN TIME: 26.1 s — AB (ref 11.6–15.2)

## 2015-11-23 DIAGNOSIS — G4733 Obstructive sleep apnea (adult) (pediatric): Secondary | ICD-10-CM | POA: Diagnosis not present

## 2015-11-24 LAB — INSULIN, RANDOM: Insulin: 50.6 u[IU]/mL — ABNORMAL HIGH (ref 2.0–19.6)

## 2015-11-26 ENCOUNTER — Encounter: Payer: Self-pay | Admitting: Physician Assistant

## 2015-11-27 ENCOUNTER — Encounter: Payer: Self-pay | Admitting: Internal Medicine

## 2015-11-27 ENCOUNTER — Ambulatory Visit (HOSPITAL_COMMUNITY)
Admission: RE | Admit: 2015-11-27 | Discharge: 2015-11-27 | Disposition: A | Discharge status: Home / Self Care | Payer: PPO | Source: Ambulatory Visit | Attending: Internal Medicine | Admitting: Internal Medicine

## 2015-11-27 ENCOUNTER — Ambulatory Visit (INDEPENDENT_AMBULATORY_CARE_PROVIDER_SITE_OTHER): Payer: PPO | Admitting: Internal Medicine

## 2015-11-27 VITALS — BP 104/58 | HR 62 | Temp 98.0°F | Resp 18 | Ht 68.0 in

## 2015-11-27 DIAGNOSIS — R0781 Pleurodynia: Secondary | ICD-10-CM

## 2015-11-27 DIAGNOSIS — S299XXA Unspecified injury of thorax, initial encounter: Secondary | ICD-10-CM | POA: Diagnosis not present

## 2015-11-27 DIAGNOSIS — Z951 Presence of aortocoronary bypass graft: Secondary | ICD-10-CM | POA: Insufficient documentation

## 2015-11-27 DIAGNOSIS — I517 Cardiomegaly: Secondary | ICD-10-CM | POA: Insufficient documentation

## 2015-11-27 MED ORDER — HYDROCODONE-ACETAMINOPHEN 5-325 MG PO TABS: 1.0000 | ORAL_TABLET | Freq: Four times a day (QID) | ORAL | Status: DC | PRN

## 2015-11-27 NOTE — Progress Notes (Signed)
   Subjective:    Patient ID: Robert Stanton, male    DOB: 03-13-1941, 75 y.o.   MRN: [id number]  Fall Pertinent negatives include no fever, headaches, nausea or vomiting.  Patient fell in the yard on Monday on his right side while picking up limbs in the yard.  He reports that he did not hit his head on the ground and did not lose consciousness.  Patient reports that his chest is hurting on the right side that he fell on.  It hurts him to breath and it hurts him to cough.  NO hemyoptysis.  NO issues with headaches, blurry vision, nausea, vomiting, and weakness.  Review of Systems  Constitutional: Negative for fever, chills and fatigue.  Respiratory: Negative for cough, chest tightness and shortness of breath.   Cardiovascular: Positive for chest pain.  Gastrointestinal: Negative for nausea and vomiting.  Neurological: Negative for dizziness, weakness, light-headedness and headaches.       Objective:   Physical Exam  Constitutional: He is oriented to person, place, and time. He appears well-developed and well-nourished. No distress.  HENT:  Head: Normocephalic.  Mouth/Throat: Oropharynx is clear and moist. No oropharyngeal exudate.  Eyes: Conjunctivae are normal. No scleral icterus.  Neck: Normal range of motion. Neck supple. No JVD present. No thyromegaly present.  Cardiovascular: Normal rate, regular rhythm, normal heart sounds and intact distal pulses.  Exam reveals no gallop and no friction rub.   No murmur heard. Pulmonary/Chest: Effort normal and breath sounds normal. No respiratory distress. He has no wheezes. He has no rales. Chest wall is not dull to percussion. He exhibits tenderness, bony tenderness and swelling. He exhibits no mass, no laceration, no crepitus, no edema, no deformity and no retraction.    Abdominal: Soft. Bowel sounds are normal. He exhibits no distension and no mass. There is no tenderness. There is no rebound and no guarding.  Musculoskeletal: Normal  range of motion.  Lymphadenopathy:    He has no cervical adenopathy.  Neurological: He is alert and oriented to person, place, and time.  Skin: Skin is warm and dry. He is not diaphoretic.  Psychiatric: He has a normal mood and affect. His behavior is normal. Judgment and thought content normal.  Nursing note and vitals reviewed.   Filed Vitals:   11/27/15 1449  BP: 104/58  Pulse: 62  Temp: 98 F (36.7 C)  Resp: 18          Assessment & Plan:    1. Rib pain on right side -no broken ribs per xray -likely bruising -no abdominal tenderness to palpation. -encouraged deep inspirations - HYDROcodone-acetaminophen (NORCO) 5-325 MG tablet; Take 1 tablet by mouth every 6 (six) hours as needed.  Dispense: 40 tablet; Refill: 0 - DG Ribs Unilateral Right; Future - DG Chest 2 View; Future

## 2015-11-27 NOTE — Patient Instructions (Signed)
Rib Contusion A rib contusion is a deep bruise on your rib area. Contusions are the result of a blunt trauma that causes bleeding and injury to the tissues under the skin. A rib contusion may involve bruising of the ribs and of the skin and muscles in the area. The skin overlying the contusion may turn blue, purple, or yellow. Minor injuries will give you a painless contusion, but more severe contusions may stay painful and swollen for a few weeks. CAUSES  A contusion is usually caused by a blow, trauma, or direct force to an area of the body. This often occurs while playing contact sports. SYMPTOMS  Swelling and redness of the injured area.  Discoloration of the injured area.  Tenderness and soreness of the injured area.  Pain with or without movement. DIAGNOSIS  The diagnosis can be made by taking a medical history and performing a physical exam. An X-ray, CT scan, or MRI may be needed to determine if there were any associated injuries, such as broken bones (fractures) or internal injuries. TREATMENT  Often, the best treatment for a rib contusion is rest. Icing or applying cold compresses to the injured area may help reduce swelling and inflammation. Deep breathing exercises may be recommended to reduce the risk of partial lung collapse and pneumonia. Over-the-counter or prescription medicines may also be recommended for pain control. HOME CARE INSTRUCTIONS   Apply ice to the injured area:  Put ice in a plastic bag.  Place a towel between your skin and the bag.  Leave the ice on for 20 minutes, 2-3 times per day.  Take medicines only as directed by your health care provider.  Rest the injured area. Avoid strenuous activity and any activities or movements that cause pain. Be careful during activities and avoid bumping the injured area.  Perform deep-breathing exercises as directed by your health care provider.  Do not lift anything that is heavier than 5 lb (2.3 kg) until your  health care provider approves.  Do not use any tobacco products, including cigarettes, chewing tobacco, or electronic cigarettes. If you need help quitting, ask your health care provider. SEEK MEDICAL CARE IF:   You have increased bruising or swelling.  You have pain that is not controlled with treatment.  You have a fever. SEEK IMMEDIATE MEDICAL CARE IF:   You have difficulty breathing or shortness of breath.  You develop a continual cough, or you cough up thick or bloody sputum.  You feel sick to your stomach (nauseous), you throw up (vomit), or you have abdominal pain.   This information is not intended to replace advice given to you by your health care provider. Make sure you discuss any questions you have with your health care provider.   Document Released: 06/29/2001 Document Revised: 10/25/2014 Document Reviewed: 07/16/2014 Elsevier Interactive Patient Education 2016 Elsevier Inc.  

## 2015-12-17 ENCOUNTER — Other Ambulatory Visit: Payer: Self-pay | Admitting: Internal Medicine

## 2015-12-18 ENCOUNTER — Other Ambulatory Visit: Payer: Self-pay | Admitting: Physician Assistant

## 2015-12-19 NOTE — Telephone Encounter (Signed)
Rx request sent to pharmacy.  

## 2015-12-21 DIAGNOSIS — G4733 Obstructive sleep apnea (adult) (pediatric): Secondary | ICD-10-CM | POA: Diagnosis not present

## 2015-12-23 DIAGNOSIS — N189 Chronic kidney disease, unspecified: Secondary | ICD-10-CM | POA: Diagnosis not present

## 2015-12-23 DIAGNOSIS — N2581 Secondary hyperparathyroidism of renal origin: Secondary | ICD-10-CM | POA: Diagnosis not present

## 2015-12-23 DIAGNOSIS — N184 Chronic kidney disease, stage 4 (severe): Secondary | ICD-10-CM | POA: Diagnosis not present

## 2015-12-25 ENCOUNTER — Encounter: Payer: Self-pay | Admitting: Internal Medicine

## 2015-12-25 ENCOUNTER — Ambulatory Visit (INDEPENDENT_AMBULATORY_CARE_PROVIDER_SITE_OTHER): Payer: PPO | Admitting: Internal Medicine

## 2015-12-25 VITALS — BP 140/62 | HR 58 | Temp 98.2°F | Resp 16 | Ht 68.0 in | Wt 232.0 lb

## 2015-12-25 DIAGNOSIS — R6889 Other general symptoms and signs: Secondary | ICD-10-CM | POA: Diagnosis not present

## 2015-12-25 DIAGNOSIS — I10 Essential (primary) hypertension: Secondary | ICD-10-CM

## 2015-12-25 DIAGNOSIS — Z7901 Long term (current) use of anticoagulants: Secondary | ICD-10-CM | POA: Diagnosis not present

## 2015-12-25 DIAGNOSIS — I129 Hypertensive chronic kidney disease with stage 1 through stage 4 chronic kidney disease, or unspecified chronic kidney disease: Secondary | ICD-10-CM | POA: Diagnosis not present

## 2015-12-25 DIAGNOSIS — I714 Abdominal aortic aneurysm, without rupture, unspecified: Secondary | ICD-10-CM

## 2015-12-25 DIAGNOSIS — Z9119 Patient's noncompliance with other medical treatment and regimen: Secondary | ICD-10-CM

## 2015-12-25 DIAGNOSIS — I480 Paroxysmal atrial fibrillation: Secondary | ICD-10-CM | POA: Diagnosis not present

## 2015-12-25 DIAGNOSIS — J449 Chronic obstructive pulmonary disease, unspecified: Secondary | ICD-10-CM

## 2015-12-25 DIAGNOSIS — I472 Ventricular tachycardia, unspecified: Secondary | ICD-10-CM

## 2015-12-25 DIAGNOSIS — Z91199 Patient's noncompliance with other medical treatment and regimen due to unspecified reason: Secondary | ICD-10-CM

## 2015-12-25 DIAGNOSIS — I25110 Atherosclerotic heart disease of native coronary artery with unstable angina pectoris: Secondary | ICD-10-CM

## 2015-12-25 DIAGNOSIS — Z952 Presence of prosthetic heart valve: Secondary | ICD-10-CM

## 2015-12-25 DIAGNOSIS — N184 Chronic kidney disease, stage 4 (severe): Secondary | ICD-10-CM

## 2015-12-25 DIAGNOSIS — I482 Chronic atrial fibrillation: Secondary | ICD-10-CM

## 2015-12-25 DIAGNOSIS — N281 Cyst of kidney, acquired: Secondary | ICD-10-CM

## 2015-12-25 DIAGNOSIS — E1122 Type 2 diabetes mellitus with diabetic chronic kidney disease: Secondary | ICD-10-CM

## 2015-12-25 DIAGNOSIS — F411 Generalized anxiety disorder: Secondary | ICD-10-CM

## 2015-12-25 DIAGNOSIS — I701 Atherosclerosis of renal artery: Secondary | ICD-10-CM | POA: Diagnosis not present

## 2015-12-25 DIAGNOSIS — Z79899 Other long term (current) drug therapy: Secondary | ICD-10-CM

## 2015-12-25 DIAGNOSIS — E539 Vitamin B deficiency, unspecified: Secondary | ICD-10-CM

## 2015-12-25 DIAGNOSIS — N2581 Secondary hyperparathyroidism of renal origin: Secondary | ICD-10-CM | POA: Diagnosis not present

## 2015-12-25 DIAGNOSIS — I739 Peripheral vascular disease, unspecified: Secondary | ICD-10-CM | POA: Diagnosis not present

## 2015-12-25 DIAGNOSIS — K219 Gastro-esophageal reflux disease without esophagitis: Secondary | ICD-10-CM

## 2015-12-25 DIAGNOSIS — Z0001 Encounter for general adult medical examination with abnormal findings: Secondary | ICD-10-CM | POA: Diagnosis not present

## 2015-12-25 DIAGNOSIS — I4891 Unspecified atrial fibrillation: Secondary | ICD-10-CM | POA: Diagnosis not present

## 2015-12-25 DIAGNOSIS — K222 Esophageal obstruction: Secondary | ICD-10-CM

## 2015-12-25 DIAGNOSIS — D126 Benign neoplasm of colon, unspecified: Secondary | ICD-10-CM

## 2015-12-25 DIAGNOSIS — G4733 Obstructive sleep apnea (adult) (pediatric): Secondary | ICD-10-CM

## 2015-12-25 DIAGNOSIS — E782 Mixed hyperlipidemia: Secondary | ICD-10-CM

## 2015-12-25 DIAGNOSIS — E559 Vitamin D deficiency, unspecified: Secondary | ICD-10-CM

## 2015-12-25 DIAGNOSIS — Q6100 Congenital renal cyst, unspecified: Secondary | ICD-10-CM

## 2015-12-25 DIAGNOSIS — I4821 Permanent atrial fibrillation: Secondary | ICD-10-CM

## 2015-12-25 DIAGNOSIS — M1000 Idiopathic gout, unspecified site: Secondary | ICD-10-CM

## 2015-12-25 DIAGNOSIS — D631 Anemia in chronic kidney disease: Secondary | ICD-10-CM | POA: Diagnosis not present

## 2015-12-25 DIAGNOSIS — F17200 Nicotine dependence, unspecified, uncomplicated: Secondary | ICD-10-CM

## 2015-12-25 DIAGNOSIS — Z0000 Encounter for general adult medical examination without abnormal findings: Secondary | ICD-10-CM

## 2015-12-25 DIAGNOSIS — I4729 Other ventricular tachycardia: Secondary | ICD-10-CM

## 2015-12-25 DIAGNOSIS — N182 Chronic kidney disease, stage 2 (mild): Secondary | ICD-10-CM

## 2015-12-25 DIAGNOSIS — E039 Hypothyroidism, unspecified: Secondary | ICD-10-CM

## 2015-12-25 NOTE — Progress Notes (Deleted)
Assessment and Plan:    HPI 75 y.o.male presents for 1 month follow up of PAF with longterm anticoagulation use. Patient reports that they have been doing well.  His ribs have been feeling much better since his recent mechanical fall.  male is taking their medication.  They are not having difficulty with their medications.  They report no adverse reactions.  He has not fallen since the fall a month ago.  He hasn't hit his head.  No missed doses.  No abx recently.  Past Medical History  Diagnosis Date  . Mechanical heart valve present     a. Hx mechanical St Jude AVR 2000.  . Cataracts, bilateral   . OSA (obstructive sleep apnea)   . Permanent atrial fibrillation ([place])   . Ischemic cardiomyopathy   . History of echocardiogram 04/17/2013    EF 40-45%; LV hypertrophy; LV mild-mod dilated; AV regurg.; LA severely diated, RA mildly dilated  . History of Doppler ultrasound 04/05/2013    LEAs; small distal abd. aoritc aneuysm is stable; fem-pop graft to R leg has 50-69% blockage  . History of nuclear stress test 05/14/2011    Dipyridamole; moderate perfusion defect in Basal Infrioer & Mid Inferior region - consistent w/infarct or scar; global LV systolic function mildly reduced; EKG negative for ischemia; low risk scan  . COPD (chronic obstructive pulmonary disease) ([place])   . Hyperlipidemia   . Hypertension   . Kidney cysts   . CKD (chronic kidney disease), stage III   . Hiatal hernia   . GERD (gastroesophageal reflux disease)   . Prediabetes   . Gout   . AAA (abdominal aortic aneurysm) ([place])   . Vitamin B deficiency   . Peripheral arterial disease ([place])     a. s/p aorto bifem BG;  b. 11/2013 PVA distal R Fem-fem stenosis;  c. 12/2013 PTA of dist R femoral bypass graft.  [name] CAD (coronary artery disease)     a. CABG '00. b. all SVGs occluded 2010. c. low risk Nuc 2012   . Chronic anticoagulation   . Ventricular tachycardia (paroxysmal) ([place])     Seen by Dr. Lovena Le with EP, started on  amiodarone     Allergies  Allergen Reactions  . Chantix [Varenicline] Nausea And Vomiting  . Lyrica [Pregabalin]     Swelling, couldn't breathe  . Nsaids Other (See Comments)    Unknown   . Simvastatin     Pt said he had a "bleed out" after taking it  . Ziac [Bisoprolol-Hydrochlorothiazide]     fatigue      Current Outpatient Prescriptions on File Prior to Visit  Medication Sig Dispense Refill  . albuterol (PROVENTIL) (2.5 MG/3ML) 0.083% nebulizer solution Take 2.5 mg by nebulization every 6 (six) hours as needed for wheezing or shortness of breath.    . allopurinol (ZYLOPRIM) 300 MG tablet TAKE 1 TABLET BY MOUTH DAILY. TO PREVENT GOUT 90 tablet 1  . amiodarone (PACERONE) 200 MG tablet TAKE 1 TABLET (200 MG TOTAL) BY MOUTH DAILY. 30 tablet 6  . aspirin 81 MG tablet Take 81 mg by mouth daily.    . bumetanide (BUMEX) 2 MG tablet Take 1/2 tablet daily as needed for swelling 30 tablet 6  . Cholecalciferol (VITAMIN D PO) Take 5,000 Units by mouth daily.     . clotrimazole-betamethasone (LOTRISONE) cream Apply 1 application topically 2 (two) times daily. 15 g 2  . Cyanocobalamin (VITAMIN B 12 PO) Take 1,500 mcg by mouth daily.     [name]  fenofibrate (TRICOR) 145 MG tablet Take 1 tablet daily for Blood Fats 90 tablet 99  . ferrous sulfate 325 (65 FE) MG tablet Take 325 mg by mouth daily.    [name] HYDROcodone-acetaminophen (NORCO) 5-325 MG tablet Take 1 tablet by mouth every 6 (six) hours as needed. 40 tablet 0  . ipratropium-albuterol (DUONEB) 0.5-2.5 (3) MG/3ML SOLN Inhale 3 mLs into the lungs every 6 (six) hours as needed (shortness of breath).     . isosorbide mononitrate (IMDUR) 30 MG 24 hr tablet TAKE 1 TABLET BY MOUTH EVERY DAY 90 tablet 1  . levothyroxine (SYNTHROID, LEVOTHROID) 50 MCG tablet TAKE 1 TABLET BY MOUTH EVERY DAY 90 tablet 1  . montelukast (SINGULAIR) 10 MG tablet Take 1 tablet (10 mg total) by mouth daily. 30 tablet 2  . pravastatin (PRAVACHOL) 40 MG tablet TAKE 1 TABLET (40 MG  TOTAL) BY MOUTH EVERY MORNING. 90 tablet 1  . ranitidine (ZANTAC) 300 MG tablet TAKE 1 TABLET (300 MG TOTAL) BY MOUTH AT BEDTIME. 90 tablet 1  . sertraline (ZOLOFT) 50 MG tablet Take 1 tablet (50 mg total) by mouth daily. 30 tablet 2  . tiotropium (SPIRIVA) 18 MCG inhalation capsule Place 18 mcg into inhaler and inhale daily as needed (for shortness of breath).     . verapamil (CALAN) 80 MG tablet TAKE 1 TABLET (80 MG TOTAL) BY MOUTH 2 (TWO) TIMES DAILY. 180 tablet 1  . warfarin (COUMADIN) 5 MG tablet Take 0.5-1 tablets (2.5-5 mg total) by mouth daily. Take 2.5mg  on Monday and Friday.  5mg  all other days of the week. (Patient taking differently: Take 2.5-5 mg by mouth daily. ) 90 tablet 3   No current facility-administered medications on file prior to visit.    ROS: all negative except above.   Physical Exam: There were no vitals filed for this visit. There were no vitals taken for this visit. General Appearance: Well developed well nourished, non-toxic appearing in no apparent distress. Eyes: PERRLA, EOMs, conjunctiva w/ no swelling or erythema or discharge Sinuses: No Frontal/maxillary tenderness ENT/Mouth: Ear canals clear without swelling or erythema.  TM's normal bilaterally with no retractions, bulging, or loss of landmarks.   Neck: Supple, thyroid normal, no notable JVD  Respiratory: Respiratory effort normal, Clear breath sounds anteriorly and posteriorly bilaterally without rales, rhonchi, wheezing or stridor. No retractions or accessory muscle usage. Cardio: RRR with no MRGs.   Abdomen: Soft, + BS.  Non tender, no guarding, rebound, hernias, masses.  Musculoskeletal: Full ROM, 5/5 strength, normal gait.  Skin: Warm, dry without rashes  Neuro: Awake and oriented X 3, Cranial nerves intact. Normal muscle tone, no cerebellar symptoms. Sensation intact.  Psych: normal affect, Insight and Judgment appropriate.     [name], PA-C 3:44 PM [facility]  Internal Medicine

## 2015-12-25 NOTE — Progress Notes (Signed)
Patient ID: Robert Stanton, male   DOB: 06-30-1941, 75 y.o.   MRN: [id number]  MEDICARE ANNUAL WELLNESS VISIT AND FOLLOW UP Assessment:    1. Long term current use of anticoagulant therapy -cont coumadin -dose adjust prn - Protime-INR  2. PAF (paroxysmal atrial fibrillation) (HCC) -cont coumadin - Protime-INR  3. Essential hypertension -cont meds -dash diet -quit smoking -monitor at home  4. Atherosclerosis of native coronary artery of native heart with unstable angina pectoris ([place]) -followed by cards  5. PAD,s/p aorto bifem BG, s/p Rt Fem PTA 3/15 -followed by vascular  6. Permanent atrial fibrillation ([place]) -follwed by cards  7. Renal artery stenosis ([place]) -follwed by cards and nephrology  8. Abdominal aortic aneurysm (AAA) without rupture ([place]) -followed by vascular  9. Peripheral arterial disease ([place]) -followed by vascular  10. 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 Ventricular tachycardia (paroxysmal) ([place]) -followed by cards  12. Chronic obstructive pulmonary disease, unspecified COPD type ([place]) -recommended quitting smoking -he is not ready to quit -cont inhalers  13. OSA and COPD overlap syndrome ([place]) -quit smoking  14. Benign neoplasm of colon, unspecified part of colon -follow with colonoscopy  15. ESOPHAGEAL STRICTURE -cont meds  16. Gastroesophageal reflux disease, esophagitis presence not specified -avoid trigger foods -decrease meds  17. Type 2 diabetes mellitus with stage 2 chronic kidney disease, without long-term current use of insulin (HCC) -diet and exercise -cont meds  18. Hypothyroidism, unspecified hypothyroidism type -cont levothyroxine  19. Kidney cysts -followed by nephrology  20. Hyperlipidemia -cont meds  21. Idiopathic gout, unspecified chronicity, unspecified site -cont meds  22. Vitamin B deficiency -cont B complex vitamin  23. Medication management -followed  with labs  24. Vitamin D deficiency -cont supplement  25. Status post mechanical aortic valve replacement -cont coumadin  26. Generalized anxiety disorder -cont meds  27. Poor compliance -encouraged  28. Tobacco use disorder -quit  29. Morbid obesity, unspecified obesity type ([place]) -diet and exercise  30. Medicare annual wellness visit, subsequent -due next year    Over 30 minutes of exam, counseling, chart review, and critical decision making was performed  Plan:   During the course of the visit the patient was educated and counseled about appropriate screening and preventive services including:    Pneumococcal vaccine   Influenza vaccine  Prevnar 13  Td vaccine  Screening electrocardiogram  Colorectal cancer screening  Diabetes screening  Glaucoma screening  Nutrition counseling   Conditions/risks identified: BMI: Discussed weight loss, diet, and increase physical activity.  Increase physical activity: AHA recommends 150 minutes of physical activity a week.  Medications reviewed Diabetes is at goal, ACE/ARB therapy: No, Reason not on Ace Inhibitor/ARB therapy:  not indicated Urinary Incontinence is not an issue: discussed non pharmacology and pharmacology options.  Fall risk: moderate- discussed PT, home fall assessment, medications.    Subjective:  [name] is a 75 y.o. male who presents for Medicare Annual Wellness Visit and 1 month follow up for long term anticoagulation for PAF.  Last wellness visit was in 2016.    74 y.o.male presents for 1 month follow up of PAF with longterm anticoagulation use. Patient reports that they have been doing well.  His ribs have been feeling much better since his recent mechanical fall.  male is taking their medication.  They are not having difficulty with their medications.  They report no adverse reactions.  He has not fallen since the fall a  month ago.  He hasn't hit his head.  No missed doses.  No abx  recently.  Medication Review: Current Outpatient Prescriptions on File Prior to Visit  Medication Sig Dispense Refill  . albuterol (PROVENTIL) (2.5 MG/3ML) 0.083% nebulizer solution Take 2.5 mg by nebulization every 6 (six) hours as needed for wheezing or shortness of breath.    . allopurinol (ZYLOPRIM) 300 MG tablet TAKE 1 TABLET BY MOUTH DAILY. TO PREVENT GOUT 90 tablet 1  . amiodarone (PACERONE) 200 MG tablet TAKE 1 TABLET (200 MG TOTAL) BY MOUTH DAILY. 30 tablet 6  . aspirin 81 MG tablet Take 81 mg by mouth daily.    . bumetanide (BUMEX) 2 MG tablet Take 1/2 tablet daily as needed for swelling 30 tablet 6  . Cholecalciferol (VITAMIN D PO) Take 5,000 Units by mouth daily.     . clotrimazole-betamethasone (LOTRISONE) cream Apply 1 application topically 2 (two) times daily. 15 g 2  . Cyanocobalamin (VITAMIN B 12 PO) Take 1,500 mcg by mouth daily.     . fenofibrate (TRICOR) 145 MG tablet Take 1 tablet daily for Blood Fats 90 tablet 99  . ferrous sulfate 325 (65 FE) MG tablet Take 325 mg by mouth daily.    [name] HYDROcodone-acetaminophen (NORCO) 5-325 MG tablet Take 1 tablet by mouth every 6 (six) hours as needed. 40 tablet 0  . ipratropium-albuterol (DUONEB) 0.5-2.5 (3) MG/3ML SOLN Inhale 3 mLs into the lungs every 6 (six) hours as needed (shortness of breath).     . isosorbide mononitrate (IMDUR) 30 MG 24 hr tablet TAKE 1 TABLET BY MOUTH EVERY DAY 90 tablet 1  . levothyroxine (SYNTHROID, LEVOTHROID) 50 MCG tablet TAKE 1 TABLET BY MOUTH EVERY DAY 90 tablet 1  . montelukast (SINGULAIR) 10 MG tablet Take 1 tablet (10 mg total) by mouth daily. 30 tablet 2  . pravastatin (PRAVACHOL) 40 MG tablet TAKE 1 TABLET (40 MG TOTAL) BY MOUTH EVERY MORNING. 90 tablet 1  . ranitidine (ZANTAC) 300 MG tablet TAKE 1 TABLET (300 MG TOTAL) BY MOUTH AT BEDTIME. 90 tablet 1  . sertraline (ZOLOFT) 50 MG tablet Take 1 tablet (50 mg total) by mouth daily. 30 tablet 2  . tiotropium (SPIRIVA) 18 MCG inhalation capsule Place  18 mcg into inhaler and inhale daily as needed (for shortness of breath).     . verapamil (CALAN) 80 MG tablet TAKE 1 TABLET (80 MG TOTAL) BY MOUTH 2 (TWO) TIMES DAILY. 180 tablet 1  . warfarin (COUMADIN) 5 MG tablet Take 0.5-1 tablets (2.5-5 mg total) by mouth daily. Take 2.5mg  on Monday and Friday.  5mg  all other days of the week. (Patient taking differently: Take 2.5-5 mg by mouth daily. ) 90 tablet 3   No current facility-administered medications on file prior to visit.    Current Problems (verified) Patient Active Problem List   Diagnosis Date Noted  . Medicare annual wellness visit, subsequent 08/18/2015  . Ventricular tachycardia (paroxysmal) ([place]) 06/11/2015  . Tobacco use disorder 05/15/2015  . Morbid obesity  (BMI 35.57) 05/15/2015  . Poor compliance 02/10/2015  . Hypothyroidism 10/07/2014  . Generalized anxiety disorder 10/03/2014  . Status post mechanical aortic valve replacement 09/02/2014  . Medication management 02/21/2014  . Vitamin D deficiency 02/21/2014  . Long term current use of anticoagulant therapy 01/16/2014  . Permanent atrial fibrillation ([place]) 12/04/2013  . 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 Peripheral arterial disease ([place]) 12/02/2013  . COPD  CT done 12/20/13  ok   . Kidney cysts   . GERD (gastroesophageal reflux disease)   . Type 2 diabetes mellitus with chronic kidney disease ([place])   . Gout   . OSA and COPD overlap syndrome ([place])   . AAA (abdominal aortic aneurysm) ([place])   . Vitamin B deficiency   . Renal artery stenosis ([place]) 05/01/2013  . PAD,s/p aorto bifem BG, s/p Rt Fem PTA 3/15 04/06/2013  . COLONIC POLYPS 12/26/2007  . Hyperlipidemia 12/26/2007  . Essential hypertension 12/26/2007  . Coronary atherosclerosis 12/26/2007  . ESOPHAGEAL STRICTURE 12/26/2007    Screening Tests Immunization History  Administered Date(s) Administered  . DT 01/25/2012  . Influenza Split 06/28/2013  . Influenza, High Dose Seasonal PF  08/02/2014, 07/17/2015  . Pneumococcal Conjugate-13 03/25/2014  . Pneumococcal Polysaccharide-23 08/11/2012  . Zoster 10/30/2010    Preventative care: Last colonoscopy: 2008  Prior vaccinations: TD or Tdap: 2013  Influenza: 2016 Pneumococcal: 2013 Prevnar13: 2015 Shingles/Zostavax: 2012  Names of Other Physician/Practitioners you currently use: 1. [facility] here for primary care 2. Doesn't see one, eye doctor, last visit  3. Doesn't see one, dentist, last visit  Patient Care Team: Unk Pinto, MD as PCP - General (Internal Medicine) Pixie Casino, MD as Consulting Physician (Cardiology) Inda Castle, MD as Consulting Physician (Gastroenterology) Lorretta Harp, MD as Consulting Physician (Cardiology) Elmarie Shiley, MD as Consulting Physician (Nephrology) Rana Snare, MD as Consulting Physician (Urology)  Past Surgical History  Procedure Laterality Date  . Stents in kidneys    . Coronary artery bypass graft  2000    3 vessel; LIMA to LAD; RCA, OM  . Shunts in femoral arteries    . Cardioversion  8/282012  . Aortic valve replacement      St. Jude  . Femoral bypass    . Cardiac catheterization  11/25/2008    loss of 2/3 (RCA, OM) bypass grafts with patent internal mammary artery to LAD; large collateral filling of RCA ; osteal narrowing of circumflex of ~50%  . Lower extremity angiogram N/A 12/03/2013    Procedure: LOWER EXTREMITY ANGIOGRAM;  Surgeon: Lorretta Harp, MD;  Location: [facility] CATH LAB;  Service: Cardiovascular;  Laterality: N/A;  . Lower extremity angiogram N/A 01/07/2014    Procedure: LOWER EXTREMITY ANGIOGRAM;  Surgeon: Lorretta Harp, MD;  Location: [facility] CATH LAB;  Service: Cardiovascular;  Laterality: N/A;   Family History  Problem Relation Age of Onset  . Heart attack Father   . Heart attack Mother   . Lung cancer Sister     x 2  . Kidney cancer Sister   . Kidney disease Sister   . Cancer Brother     ?  .  Stroke Brother    Social History  Substance Use Topics  . Smoking status: Current Every Day Smoker -- 0.05 packs/day for 40 years    Types: Cigarettes  . Smokeless tobacco: Never Used  . Alcohol Use: No    MEDICARE WELLNESS OBJECTIVES: Tobacco use: He does smoke.  Patient is a former smoker. If yes, counseling given Alcohol Current alcohol use: social drinker Osteoporosis: hypogonadism, History of fracture in the past year: no Fall risk: High Risk Hearing: impaired Visual acuity: normal,  does not perform annual eye exam Diet: in general, an "unhealthy" diet Physical activity:   Cardiac risk factors:   Depression/mood screen:   Depression screen PHQ 2/9 11/21/2015  Decreased Interest 0  Down, Depressed, Hopeless 0  PHQ - 2 Score  0  Altered sleeping -  Tired, decreased energy -  Change in appetite -  Feeling bad or failure about yourself  -  Trouble concentrating -  Moving slowly or fidgety/restless -  Suicidal thoughts -  PHQ-9 Score -  Difficult doing work/chores -    ADLs:  In your present state of health, do you have any difficulty performing the following activities: 11/21/2015 09/18/2015  Hearing? N Y  Vision? N N  Difficulty concentrating or making decisions? N N  Walking or climbing stairs? N Y  Dressing or bathing? N N  Doing errands, shopping? N N  Preparing Food and eating ? - N  Using the Toilet? - N  In the past six months, have you accidently leaked urine? - N  Do you have problems with loss of bowel control? - N  Managing your Medications? - N  Managing your Finances? - N  Housekeeping or managing your Housekeeping? - N     Cognitive Testing  Alert? Yes  Normal Appearance?Yes  Oriented to person? Yes  Place? Yes   Time? Yes  Recall of three objects?  Yes  Can perform simple calculations? Yes  Displays appropriate judgment?Yes  Can read the correct time from a watch face?Yes  EOL planning:     Objective:   There were no vitals filed for this  visit. There is no weight on file to calculate BMI.  General appearance: alert, no distress, WD/WN, male HEENT: normocephalic, sclerae anicteric, TMs pearly, nares patent, no discharge or erythema, pharynx normal Oral cavity: MMM, no lesions Neck: supple, no lymphadenopathy, no thyromegaly, no masses Heart: RRR, normal S1, S2, no murmurs Lungs: CTA bilaterally, no wheezes, rhonchi, or rales Abdomen: +bs, soft, non tender, non distended, no masses, no hepatomegaly, no splenomegaly Musculoskeletal: nontender, no swelling, no obvious deformity Extremities: no edema, no cyanosis, no clubbing Pulses: 2+ symmetric, upper and lower extremities, normal cap refill Neurological: alert, oriented x 3, CN2-12 intact, strength normal upper extremities and lower extremities, sensation normal throughout, DTRs 2+ throughout, no cerebellar signs, gait normal Psychiatric: normal affect, behavior normal, pleasant   Medicare Attestation I have personally reviewed: The patient's medical and social history Their use of alcohol, tobacco or illicit drugs Their current medications and supplements The patient's functional ability including ADLs,fall risks, home safety risks, cognitive, and hearing and visual impairment Diet and physical activities Evidence for depression or mood disorders  The patient's weight, height, BMI, and visual acuity have been recorded in the chart.  I have made referrals, counseling, and provided education to the patient based on review of the above and I have provided the patient with a written personalized care plan for preventive services.     [name], PA-C   12/25/2015

## 2015-12-26 ENCOUNTER — Ambulatory Visit: Payer: Self-pay | Admitting: Physician Assistant

## 2015-12-26 LAB — PROTIME-INR
INR: 2.8 — ABNORMAL HIGH (ref <1.50)
Prothrombin Time: 29.9 seconds — ABNORMAL HIGH (ref 11.6–15.2)

## 2016-01-01 ENCOUNTER — Other Ambulatory Visit: Payer: Self-pay | Admitting: Internal Medicine

## 2016-01-06 ENCOUNTER — Ambulatory Visit (INDEPENDENT_AMBULATORY_CARE_PROVIDER_SITE_OTHER): Payer: PPO | Admitting: Podiatry

## 2016-01-06 ENCOUNTER — Encounter: Payer: Self-pay | Admitting: Podiatry

## 2016-01-06 DIAGNOSIS — B351 Tinea unguium: Secondary | ICD-10-CM | POA: Diagnosis not present

## 2016-01-06 DIAGNOSIS — M79676 Pain in unspecified toe(s): Secondary | ICD-10-CM | POA: Diagnosis not present

## 2016-01-06 NOTE — Progress Notes (Signed)
Patient ID: Robert Stanton, male   DOB: Aug 18, 1941, 75 y.o.   MRN: [id number] Complaint:  Visit Type: Patient returns to my office for continued preventative foot care services. Complaint: Patient states" my nails have grown long and thick and become painful to walk and wear shoes" Patient has been diagnosed with DM with no foot complications. The patient presents for preventative foot care services. No changes to ROS.  Patient says he has no pain between his 4/5 toes right foot.  Podiatric Exam: Vascular: dorsalis pedis and posterior tibial pulses are palpable bilateral. Capillary return is immediate. Temperature gradient is WNL. Skin turgor WNL  Sensorium: Normal Semmes Weinstein monofilament test. Normal tactile sensation bilaterally. Nail Exam: Pt has thick disfigured discolored nails with subungual debris noted bilateral entire nail hallux through fifth toenails Ulcer Exam: There is no evidence of ulcer or pre-ulcerative changes or infection. Orthopedic Exam: Muscle tone and strength are WNL. No limitations in general ROM. No crepitus or effusions noted. Foot type and digits show no abnormalities. Bony prominences are unremarkable. Skin: No Porokeratosis. No infection or ulcers  Diagnosis:  Onychomycosis, , Pain in right toe, pain in left toes  Treatment & Plan Procedures and Treatment: Consent by patient was obtained for treatment procedures. The patient understood the discussion of treatment and procedures well. All questions were answered thoroughly reviewed. Debridement of mycotic and hypertrophic toenails, 1 through 5 bilateral and clearing of subungual debris. No ulceration, no infection noted.  Return Visit-Office Procedure: Patient instructed to return to the office for a follow up visit 3 months for continued evaluation and treatment.    Gardiner Barefoot DPM

## 2016-01-08 ENCOUNTER — Other Ambulatory Visit: Payer: Self-pay | Admitting: Internal Medicine

## 2016-01-19 ENCOUNTER — Telehealth: Payer: Self-pay | Admitting: Internal Medicine

## 2016-01-19 NOTE — Telephone Encounter (Signed)
Both historic meds. Returned call & advised OK to fill.

## 2016-01-19 NOTE — Telephone Encounter (Signed)
Mia ( [facility]) is calling because there is a drug interaction with Robert Stanton Amiodarone and his Coumadin . Please Call   Thanks

## 2016-01-21 DIAGNOSIS — G4733 Obstructive sleep apnea (adult) (pediatric): Secondary | ICD-10-CM | POA: Diagnosis not present

## 2016-01-30 ENCOUNTER — Other Ambulatory Visit: Payer: Self-pay | Admitting: Internal Medicine

## 2016-02-03 ENCOUNTER — Encounter: Payer: Self-pay | Admitting: Internal Medicine

## 2016-02-03 ENCOUNTER — Ambulatory Visit (INDEPENDENT_AMBULATORY_CARE_PROVIDER_SITE_OTHER): Payer: PPO | Admitting: Internal Medicine

## 2016-02-03 VITALS — BP 126/60 | HR 60 | Temp 98.2°F | Resp 16 | Ht 68.0 in | Wt 226.0 lb

## 2016-02-03 DIAGNOSIS — Z7901 Long term (current) use of anticoagulants: Secondary | ICD-10-CM

## 2016-02-03 DIAGNOSIS — I482 Chronic atrial fibrillation: Secondary | ICD-10-CM | POA: Diagnosis not present

## 2016-02-03 DIAGNOSIS — I4891 Unspecified atrial fibrillation: Secondary | ICD-10-CM | POA: Diagnosis not present

## 2016-02-03 DIAGNOSIS — Z952 Presence of prosthetic heart valve: Secondary | ICD-10-CM | POA: Diagnosis not present

## 2016-02-03 DIAGNOSIS — I4821 Permanent atrial fibrillation: Secondary | ICD-10-CM

## 2016-02-03 LAB — PROTIME-INR
INR: 1.58 — ABNORMAL HIGH (ref <1.50)
PROTHROMBIN TIME: 19.1 s — AB (ref 11.6–15.2)

## 2016-02-03 NOTE — Progress Notes (Signed)
Assessment and Plan:   1. Permanent atrial fibrillation (HCC) -cont coumadin -dose adjust if necessary  - Protime-INR  2. Status post mechanical aortic valve replacement -cont coumadin - Protime-INR  3. Long term current use of anticoagulant therapy -cont coumadin - Protime-INR     HPI 75 y.o.male presents for 1 month follow up of PT/INR check for long term coumadin use for an artifical valve. Patient reports that they have been doing well.  male is taking their medication.  They are not having difficulty with their medications.  They report no adverse reactions.  He has not had any falls, issues with bleeding including hematochezia, melena, epistaxis, severe abdominal pain, headache, no recent abx use.    Past Medical History  Diagnosis Date  . Mechanical heart valve present     a. Hx mechanical St Jude AVR 2000.  . Cataracts, bilateral   . OSA (obstructive sleep apnea)   . Permanent atrial fibrillation ([place])   . Ischemic cardiomyopathy   . History of echocardiogram 04/17/2013    EF 40-45%; LV hypertrophy; LV mild-mod dilated; AV regurg.; LA severely diated, RA mildly dilated  . History of Doppler ultrasound 04/05/2013    LEAs; small distal abd. aoritc aneuysm is stable; fem-pop graft to R leg has 50-69% blockage  . History of nuclear stress test 05/14/2011    Dipyridamole; moderate perfusion defect in Basal Infrioer & Mid Inferior region - consistent w/infarct or scar; global LV systolic function mildly reduced; EKG negative for ischemia; low risk scan  . COPD (chronic obstructive pulmonary disease) ([place])   . Hyperlipidemia   . Hypertension   . Kidney cysts   . CKD (chronic kidney disease), stage III   . Hiatal hernia   . GERD (gastroesophageal reflux disease)   . Prediabetes   . Gout   . AAA (abdominal aortic aneurysm) ([place])   . Vitamin B deficiency   . Peripheral arterial disease ([place])     a. s/p aorto bifem BG;  b. 11/2013 PVA distal R Fem-fem stenosis;  c. 12/2013 PTA  of dist R femoral bypass graft.  [name] CAD (coronary artery disease)     a. CABG '00. b. all SVGs occluded 2010. c. low risk Nuc 2012   . Chronic anticoagulation   . Ventricular tachycardia (paroxysmal) ([place])     Seen by Dr. Lovena Le with EP, started on amiodarone     Allergies  Allergen Reactions  . Chantix [Varenicline] Nausea And Vomiting  . Lyrica [Pregabalin]     Swelling, couldn't breathe  . Nsaids Other (See Comments)    Unknown   . Simvastatin     Pt said he had a "bleed out" after taking it  . Ziac [Bisoprolol-Hydrochlorothiazide]     fatigue      Current Outpatient Prescriptions on File Prior to Visit  Medication Sig Dispense Refill  . albuterol (PROVENTIL) (2.5 MG/3ML) 0.083% nebulizer solution Take 2.5 mg by nebulization every 6 (six) hours as needed for wheezing or shortness of breath.    . allopurinol (ZYLOPRIM) 300 MG tablet TAKE 1 TABLET BY MOUTH DAILY. TO PREVENT GOUT 90 tablet 1  . amiodarone (PACERONE) 200 MG tablet TAKE 1 TABLET (200 MG TOTAL) BY MOUTH DAILY. 30 tablet 6  . aspirin 81 MG tablet Take 81 mg by mouth daily.    . bumetanide (BUMEX) 2 MG tablet Take 1/2 tablet daily as needed for swelling 30 tablet 6  . Cholecalciferol (VITAMIN D PO) Take 5,000 Units by mouth daily.     [name]  clotrimazole-betamethasone (LOTRISONE) cream Apply 1 application topically 2 (two) times daily. 15 g 2  . Cyanocobalamin (VITAMIN B 12 PO) Take 1,500 mcg by mouth daily.     . fenofibrate (TRICOR) 145 MG tablet TAKE 1 TABLET DAILY FOR BLOOD FATS 90 tablet 3  . ferrous sulfate 325 (65 FE) MG tablet Take 325 mg by mouth daily.    [name] HYDROcodone-acetaminophen (NORCO) 5-325 MG tablet Take 1 tablet by mouth every 6 (six) hours as needed. 40 tablet 0  . ipratropium-albuterol (DUONEB) 0.5-2.5 (3) MG/3ML SOLN Inhale 3 mLs into the lungs every 6 (six) hours as needed (shortness of breath).     . isosorbide mononitrate (IMDUR) 30 MG 24 hr tablet TAKE 1 TABLET BY MOUTH EVERY DAY 90 tablet 1  .  levothyroxine (SYNTHROID, LEVOTHROID) 50 MCG tablet TAKE 1 TABLET BY MOUTH EVERY DAY 90 tablet 1  . montelukast (SINGULAIR) 10 MG tablet TAKE 1 TABLET (10 MG TOTAL) BY MOUTH DAILY. 30 tablet 2  . pravastatin (PRAVACHOL) 40 MG tablet TAKE 1 TABLET (40 MG TOTAL) BY MOUTH EVERY MORNING. 90 tablet 1  . ranitidine (ZANTAC) 300 MG tablet TAKE 1 TABLET (300 MG TOTAL) BY MOUTH AT BEDTIME. 90 tablet 1  . sertraline (ZOLOFT) 50 MG tablet Take 1 tablet (50 mg total) by mouth daily. 30 tablet 2  . tiotropium (SPIRIVA) 18 MCG inhalation capsule Place 18 mcg into inhaler and inhale daily as needed (for shortness of breath).     . verapamil (CALAN) 80 MG tablet TAKE 1 TABLET (80 MG TOTAL) BY MOUTH 2 (TWO) TIMES DAILY. 180 tablet 1  . warfarin (COUMADIN) 5 MG tablet Take 0.5-1 tablets (2.5-5 mg total) by mouth daily. Take 2.5mg  on Monday and Friday.  5mg  all other days of the week. (Patient taking differently: Take 2.5-5 mg by mouth daily. ) 90 tablet 3   No current facility-administered medications on file prior to visit.    ROS: all negative except above.   Physical Exam: Filed Weights   02/03/16 1029  Weight: 226 lb (102.513 kg)   BP 126/60 mmHg  Pulse 60  Temp(Src) 98.2 F (36.8 C) (Temporal)  Resp 16  Ht 5\' 8"  (1.727 m)  Wt 226 lb (102.513 kg)  BMI 34.37 kg/m2 General Appearance: Well developed well nourished, non-toxic appearing in no apparent distress. Eyes: PERRLA, EOMs, conjunctiva w/ no swelling or erythema or discharge Sinuses: No Frontal/maxillary tenderness ENT/Mouth: Ear canals clear without swelling or erythema.  TM's normal bilaterally with no retractions, bulging, or loss of landmarks.   Neck: Supple, thyroid normal, no notable JVD  Respiratory: Respiratory effort normal, Clear breath sounds anteriorly and posteriorly bilaterally without rales, rhonchi, wheezing or stridor. No retractions or accessory muscle usage. Cardio: RRR with audible click heard best on the left sternal  border.  No MRGs.   Abdomen: Soft, + BS.  Non tender, no guarding, rebound, hernias, masses.  Musculoskeletal: Full ROM, 5/5 strength, normal gait.  Skin: Warm, dry without rashes  Neuro: Awake and oriented X 3, Cranial nerves intact. Normal muscle tone, no cerebellar symptoms. Sensation intact.  Psych: normal affect, Insight and Judgment appropriate.     [name], PA-C 11:06 AM [facility]

## 2016-02-11 ENCOUNTER — Other Ambulatory Visit: Payer: Self-pay | Admitting: Physician Assistant

## 2016-02-20 DIAGNOSIS — G4733 Obstructive sleep apnea (adult) (pediatric): Secondary | ICD-10-CM | POA: Diagnosis not present

## 2016-03-02 ENCOUNTER — Inpatient Hospital Stay (HOSPITAL_COMMUNITY)
Admission: EM | Admit: 2016-03-02 | Discharge: 2016-03-07 | DRG: 378 | Disposition: A | Discharge status: Home / Self Care | Payer: PPO | Attending: Internal Medicine | Admitting: Internal Medicine

## 2016-03-02 ENCOUNTER — Encounter (HOSPITAL_COMMUNITY): Payer: Self-pay | Admitting: Emergency Medicine

## 2016-03-02 ENCOUNTER — Encounter (INDEPENDENT_AMBULATORY_CARE_PROVIDER_SITE_OTHER): Payer: Self-pay

## 2016-03-02 DIAGNOSIS — T45515A Adverse effect of anticoagulants, initial encounter: Secondary | ICD-10-CM | POA: Diagnosis present

## 2016-03-02 DIAGNOSIS — I129 Hypertensive chronic kidney disease with stage 1 through stage 4 chronic kidney disease, or unspecified chronic kidney disease: Secondary | ICD-10-CM | POA: Diagnosis not present

## 2016-03-02 DIAGNOSIS — R791 Abnormal coagulation profile: Secondary | ICD-10-CM | POA: Diagnosis not present

## 2016-03-02 DIAGNOSIS — I2510 Atherosclerotic heart disease of native coronary artery without angina pectoris: Secondary | ICD-10-CM | POA: Diagnosis not present

## 2016-03-02 DIAGNOSIS — J449 Chronic obstructive pulmonary disease, unspecified: Secondary | ICD-10-CM | POA: Diagnosis present

## 2016-03-02 DIAGNOSIS — R040 Epistaxis: Secondary | ICD-10-CM | POA: Diagnosis present

## 2016-03-02 DIAGNOSIS — E785 Hyperlipidemia, unspecified: Secondary | ICD-10-CM | POA: Diagnosis present

## 2016-03-02 DIAGNOSIS — F17210 Nicotine dependence, cigarettes, uncomplicated: Secondary | ICD-10-CM | POA: Diagnosis not present

## 2016-03-02 DIAGNOSIS — Z7901 Long term (current) use of anticoagulants: Secondary | ICD-10-CM

## 2016-03-02 DIAGNOSIS — Z7982 Long term (current) use of aspirin: Secondary | ICD-10-CM

## 2016-03-02 DIAGNOSIS — Z952 Presence of prosthetic heart valve: Secondary | ICD-10-CM

## 2016-03-02 DIAGNOSIS — I739 Peripheral vascular disease, unspecified: Secondary | ICD-10-CM | POA: Diagnosis not present

## 2016-03-02 DIAGNOSIS — Z951 Presence of aortocoronary bypass graft: Secondary | ICD-10-CM | POA: Diagnosis not present

## 2016-03-02 DIAGNOSIS — E039 Hypothyroidism, unspecified: Secondary | ICD-10-CM | POA: Diagnosis not present

## 2016-03-02 DIAGNOSIS — I255 Ischemic cardiomyopathy: Secondary | ICD-10-CM | POA: Diagnosis not present

## 2016-03-02 DIAGNOSIS — K219 Gastro-esophageal reflux disease without esophagitis: Secondary | ICD-10-CM | POA: Diagnosis not present

## 2016-03-02 DIAGNOSIS — H269 Unspecified cataract: Secondary | ICD-10-CM | POA: Diagnosis not present

## 2016-03-02 DIAGNOSIS — E1122 Type 2 diabetes mellitus with diabetic chronic kidney disease: Secondary | ICD-10-CM | POA: Diagnosis present

## 2016-03-02 DIAGNOSIS — I714 Abdominal aortic aneurysm, without rupture: Secondary | ICD-10-CM | POA: Diagnosis not present

## 2016-03-02 DIAGNOSIS — E119 Type 2 diabetes mellitus without complications: Secondary | ICD-10-CM | POA: Diagnosis not present

## 2016-03-02 DIAGNOSIS — N184 Chronic kidney disease, stage 4 (severe): Secondary | ICD-10-CM | POA: Diagnosis present

## 2016-03-02 DIAGNOSIS — G4733 Obstructive sleep apnea (adult) (pediatric): Secondary | ICD-10-CM | POA: Diagnosis present

## 2016-03-02 DIAGNOSIS — I482 Chronic atrial fibrillation: Secondary | ICD-10-CM | POA: Diagnosis not present

## 2016-03-02 DIAGNOSIS — K921 Melena: Secondary | ICD-10-CM | POA: Diagnosis not present

## 2016-03-02 DIAGNOSIS — I4821 Permanent atrial fibrillation: Secondary | ICD-10-CM | POA: Diagnosis present

## 2016-03-02 DIAGNOSIS — I10 Essential (primary) hypertension: Secondary | ICD-10-CM | POA: Diagnosis present

## 2016-03-02 LAB — CBC WITH DIFFERENTIAL/PLATELET
BASOS ABS: 0 10*3/uL (ref 0.0–0.1)
Basophils Relative: 1 %
EOS ABS: 0.2 10*3/uL (ref 0.0–0.7)
EOS PCT: 3 %
HCT: 34.1 % — ABNORMAL LOW (ref 39.0–52.0)
Hemoglobin: 10.7 g/dL — ABNORMAL LOW (ref 13.0–17.0)
LYMPHS ABS: 1.9 10*3/uL (ref 0.7–4.0)
LYMPHS PCT: 28 %
MCH: 29 pg (ref 26.0–34.0)
MCHC: 31.4 g/dL (ref 30.0–36.0)
MCV: 92.4 fL (ref 78.0–100.0)
MONO ABS: 0.6 10*3/uL (ref 0.1–1.0)
Monocytes Relative: 9 %
Neutro Abs: 4.1 10*3/uL (ref 1.7–7.7)
Neutrophils Relative %: 59 %
PLATELETS: 195 10*3/uL (ref 150–400)
RBC: 3.69 MIL/uL — ABNORMAL LOW (ref 4.22–5.81)
RDW: 16.7 % — AB (ref 11.5–15.5)
WBC: 6.9 10*3/uL (ref 4.0–10.5)

## 2016-03-02 LAB — BASIC METABOLIC PANEL
Anion gap: 8 (ref 5–15)
BUN: 28 mg/dL — ABNORMAL HIGH (ref 6–20)
CALCIUM: 8.9 mg/dL (ref 8.9–10.3)
CO2: 27 mmol/L (ref 22–32)
CREATININE: 2.94 mg/dL — AB (ref 0.61–1.24)
Chloride: 102 mmol/L (ref 101–111)
GFR calc Af Amer: 23 mL/min — ABNORMAL LOW (ref >60)
GFR calc non Af Amer: 20 mL/min — ABNORMAL LOW (ref >60)
GLUCOSE: 108 mg/dL — AB (ref 65–99)
Potassium: 3.9 mmol/L (ref 3.5–5.1)
Sodium: 137 mmol/L (ref 135–145)

## 2016-03-02 LAB — SAMPLE TO BLOOD BANK

## 2016-03-02 LAB — PROTIME-INR
INR: 4.61 — ABNORMAL HIGH (ref 0.00–1.49)
Prothrombin Time: 42.3 seconds — ABNORMAL HIGH (ref 11.6–15.2)

## 2016-03-02 LAB — POC OCCULT BLOOD, ED: FECAL OCCULT BLD: NEGATIVE

## 2016-03-02 MED ORDER — IPRATROPIUM-ALBUTEROL 0.5-2.5 (3) MG/3ML IN SOLN
3.0000 mL | Freq: Four times a day (QID) | RESPIRATORY_TRACT | Status: DC | PRN
Start: 2016-03-02 — End: 2016-03-07

## 2016-03-02 MED ORDER — HYDROCODONE-ACETAMINOPHEN 5-325 MG PO TABS: 1.0000 | ORAL_TABLET | Freq: Four times a day (QID) | ORAL | Status: DC | PRN

## 2016-03-02 MED ORDER — FAMOTIDINE 20 MG PO TABS
40.0000 mg | ORAL_TABLET | Freq: Every day | ORAL | Status: DC
  Administered 2016-03-02: 40 mg via ORAL
  Filled 2016-03-02 (×2): qty 2

## 2016-03-02 MED ORDER — ISOSORBIDE MONONITRATE ER 30 MG PO TB24
30.0000 mg | ORAL_TABLET | Freq: Every day | ORAL | Status: DC
  Administered 2016-03-02 – 2016-03-07 (×6): 30 mg via ORAL
  Filled 2016-03-02 (×7): qty 1

## 2016-03-02 MED ORDER — PRAVASTATIN SODIUM 40 MG PO TABS
40.0000 mg | ORAL_TABLET | Freq: Every day | ORAL | Status: DC
  Administered 2016-03-02 – 2016-03-06 (×5): 40 mg via ORAL
  Filled 2016-03-02 (×5): qty 1

## 2016-03-02 MED ORDER — ALLOPURINOL 300 MG PO TABS
300.0000 mg | ORAL_TABLET | Freq: Every day | ORAL | Status: DC
  Administered 2016-03-02 – 2016-03-07 (×6): 300 mg via ORAL
  Filled 2016-03-02 (×2): qty 1
  Filled 2016-03-02: qty 3
  Filled 2016-03-02 (×4): qty 1

## 2016-03-02 MED ORDER — VERAPAMIL HCL 80 MG PO TABS
80.0000 mg | ORAL_TABLET | Freq: Two times a day (BID) | ORAL | Status: DC
  Administered 2016-03-02 – 2016-03-07 (×11): 80 mg via ORAL
  Filled 2016-03-02 (×11): qty 1

## 2016-03-02 MED ORDER — AMIODARONE HCL 200 MG PO TABS
200.0000 mg | ORAL_TABLET | Freq: Every day | ORAL | Status: DC
  Administered 2016-03-02 – 2016-03-07 (×6): 200 mg via ORAL
  Filled 2016-03-02 (×7): qty 1

## 2016-03-02 MED ORDER — ASPIRIN EC 81 MG PO TBEC
81.0000 mg | DELAYED_RELEASE_TABLET | Freq: Every day | ORAL | Status: DC
  Administered 2016-03-02 – 2016-03-07 (×6): 81 mg via ORAL
  Filled 2016-03-02 (×7): qty 1

## 2016-03-02 MED ORDER — TIOTROPIUM BROMIDE MONOHYDRATE 18 MCG IN CAPS: 18.0000 ug | ORAL_CAPSULE | Freq: Every day | RESPIRATORY_TRACT | Status: DC | PRN

## 2016-03-02 MED ORDER — ALBUTEROL SULFATE (2.5 MG/3ML) 0.083% IN NEBU: 2.5000 mg | INHALATION_SOLUTION | Freq: Four times a day (QID) | RESPIRATORY_TRACT | Status: DC | PRN

## 2016-03-02 MED ORDER — FENOFIBRATE 160 MG PO TABS
160.0000 mg | ORAL_TABLET | Freq: Every day | ORAL | Status: DC
Start: 2016-03-02 — End: 2016-03-07
  Administered 2016-03-02 – 2016-03-07 (×6): 160 mg via ORAL
  Filled 2016-03-02 (×7): qty 1

## 2016-03-02 MED ORDER — MONTELUKAST SODIUM 10 MG PO TABS
10.0000 mg | ORAL_TABLET | Freq: Every day | ORAL | Status: DC
  Administered 2016-03-02 – 2016-03-06 (×5): 10 mg via ORAL
  Filled 2016-03-02 (×5): qty 1

## 2016-03-02 MED ORDER — SERTRALINE HCL 50 MG PO TABS
50.0000 mg | ORAL_TABLET | Freq: Every day | ORAL | Status: DC
  Administered 2016-03-02 – 2016-03-07 (×6): 50 mg via ORAL
  Filled 2016-03-02 (×7): qty 1

## 2016-03-02 MED ORDER — LEVOTHYROXINE SODIUM 50 MCG PO TABS
50.0000 ug | ORAL_TABLET | Freq: Every day | ORAL | Status: DC
  Administered 2016-03-02 – 2016-03-07 (×6): 50 ug via ORAL
  Filled 2016-03-02 (×7): qty 1

## 2016-03-02 MED ORDER — FERROUS SULFATE 325 (65 FE) MG PO TABS
325.0000 mg | ORAL_TABLET | Freq: Every day | ORAL | Status: DC
  Administered 2016-03-02 – 2016-03-07 (×6): 325 mg via ORAL
  Filled 2016-03-02 (×6): qty 1

## 2016-03-02 NOTE — ED Notes (Signed)
Carb Mortified diet ordered for patient.

## 2016-03-02 NOTE — Care Management Note (Addendum)
Case Management Note  Patient Details  Name: Robert Stanton MRN: [id number] Date of Birth: [birth year]  Subjective/Objective:                Presents with 1 day history of epistaxis and some bright red blood in stool ,INR at 4.6. Hx  of DM2, HTN, CKD stage 4, AAA, PAD, mechanical aortic valve replacement on chronic coumadin.Resides with wife and 2 sons. Jeneen Rinks (son) is pt's Primary caretaker. Independent with ADL's PTA. Owns cane but doesn't use per pt. PCP: Unk Pinto.   Action/Plan: Return to home when medically stable. CM to f/u with disposition needs.  Expected Discharge Date:  03/03/16               Expected Discharge Plan:  Home/Self Care (Resides with wife, sons.)  In-House Referral:     Discharge planning Services  CM Consult  Post Acute Care Choice:    Choice offered to:     DME Arranged:   (owns cane) DME Agency:     HH Arranged:    HH Agency:     Status of Service:  In process, will continue to follow  Medicare Important Message Given:    Date Medicare IM Given:    Medicare IM give by:    Date Additional Medicare IM Given:    Additional Medicare Important Message give by:     If discussed at [place] of Stay Meetings, dates discussed:    Additional Comments:  Sharin Mons, [place] [phone] 03/02/2016, 3:09 PM

## 2016-03-02 NOTE — ED Notes (Signed)
Report given to [facility] RN 

## 2016-03-02 NOTE — Progress Notes (Signed)
PROGRESS NOTE    Robert Stanton  [id number] DOB: July 09, 1941 DOA: 03/02/2016 PCP: Alesia Richards, MD   Outpatient Specialists:   Brief Narrative: 75 y.o. male with medical history significant of DM2, HTN, CKD stage 4, AAA, PAD, mechanical aortic valve replacement on chronic coumadin. Last INR was 4.6. Patient presented to the ED with 1 day history of epistaxis and some bright red blood in stool.  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 Continue to hold coumadin.Pharmacy to assist with coumadin dosing. INR in am. No further bleeding reported.  HTN - continue home meds - A.Fib - continue home meds, holding coumadin due to supratheraputic INR -DM2 - carb mod diet, appears to be diet controlled as he is on no meds for this.  DVT prophylaxis: SCDs, INR is supratheraputic Code Status: Full Family Communication: Son at bedside Consults called: None  Subjective: Nil new complaints. No further bleeding reported  Objective: Filed Vitals:   03/02/16 0745 03/02/16 0800 03/02/16 0815 03/02/16 0830  BP: 161/61 140/64 147/60 150/72  Pulse: 59 59 62 62  Temp:      TempSrc:      Resp: 15 19 20 19   Weight:      SpO2: 95% 94% 94% 93%   No intake or output data in the 24 hours ending 03/02/16 0853 Filed Weights   03/02/16 0043  Weight: 107.14 kg (236 lb 3.2 oz)    Examination:  General exam: Appears calm and comfortable  Respiratory system: Clear to auscultation.     Cardiovascular system: S1 & S2. Mechanic valve sound Gastrointestinal system: Abdomen is obese, soft and nontender.   Central nervous system: Alert and oriented. No focal neurological deficits. Extremities: No leg edema.  Data Reviewed: I have personally reviewed following labs and imaging studies  CBC:  Recent Labs Lab 03/02/16 0055  WBC 6.9  NEUTROABS 4.1  HGB 10.7*  HCT 34.1*  MCV 92.4  PLT 0000000   Basic Metabolic Panel:  Recent Labs Lab 03/02/16 0055  NA 137  K 3.9  CL 102  CO2 27  GLUCOSE 108*  BUN 28*  CREATININE 2.94*  CALCIUM 8.9   GFR: Estimated Creatinine Clearance: 26.6 mL/min (by C-G formula based on Cr of 2.94). Liver Function Tests: No results for input(s): AST, ALT, ALKPHOS, BILITOT, PROT, ALBUMIN in the last 168 hours. No results for input(s): LIPASE, AMYLASE in the last 168 hours. No results for input(s): AMMONIA in the last 168 hours. Coagulation Profile:  Recent Labs Lab 03/02/16 0055  INR 4.61*   Cardiac Enzymes: No results for input(s): CKTOTAL, CKMB, CKMBINDEX, TROPONINI in the last 168 hours. BNP (last 3 results) No results for input(s): PROBNP in the last 8760 hours. HbA1C: No results for input(s): HGBA1C in the last 72 hours. CBG: No results for input(s): GLUCAP in the last 168 hours. Lipid Profile: No results for input(s): CHOL, HDL, LDLCALC, TRIG, CHOLHDL, LDLDIRECT in the last 72 hours. Thyroid Function Tests: No results for input(s): TSH, T4TOTAL, FREET4, T3FREE, THYROIDAB in the last 72 hours.  Anemia Panel: No results for input(s): VITAMINB12, FOLATE, FERRITIN, TIBC, IRON, RETICCTPCT in the last 72 hours. Urine analysis: No results found for: COLORURINE, APPEARANCEUR, LABSPEC, PHURINE, GLUCOSEU, HGBUR, BILIRUBINUR, KETONESUR, PROTEINUR, UROBILINOGEN, NITRITE, LEUKOCYTESUR Sepsis Labs: @LABRCNTIP (procalcitonin:4,lacticidven:4)  )No results found for this or any previous visit (from the past 240 hour(s)).        Radiology Studies: No results found.      Scheduled Meds: . allopurinol  300 mg Oral Daily  . amiodarone  200 mg Oral Daily  . aspirin EC  81 mg Oral Daily  . famotidine  40 mg Oral QHS  . fenofibrate  160 mg Oral Daily  . ferrous sulfate  325 mg Oral Q breakfast  . isosorbide mononitrate  30 mg Oral Daily  . levothyroxine  50 mcg Oral Q breakfast  . montelukast  10 mg Oral QHS  . pravastatin  40 mg Oral q1800  . sertraline  50 mg Oral Daily  . verapamil  80 mg Oral Q12H   Continuous Infusions:       Time spent: 20 Mins   Bonnell Public, MD [facility] Pager [phone] If 7PM-7AM, please contact night-coverage [email or web address] Password [facility] 03/02/2016, 8:53 AM

## 2016-03-02 NOTE — ED Notes (Signed)
Pt. reports right epistaxis onset 2 days ago with bloody stools this evening , generalized weakness and increasing skin bruises at arms this week . Pt. is taking Coumadin .

## 2016-03-02 NOTE — H&P (Signed)
History and Physical    Robert Stanton [id number] DOB: [birth year] DOA: 03/02/2016  Referring MD/NP/PA: EDP PCP: Alesia Richards, MD Patient coming from: ED  Chief Complaint: Epistaxis, hematochezia  HPI: Robert Stanton is a 75 y.o. male with medical history significant of DM2, HTN, CKD stage 4, AAA, PAD, mechanical aortic valve replacement on chronic coumadin.  Patient presents to the ED with 1 day history of epistaxis and some bright red blood in stool onset today.  Due to bleeding he didn't take his coumadin earlier in the day today and instead presented to the ED.  Blood in stool is described as bright red spots on stool.  ED Course: Hemoccult negative, vitals stable, epistaxis had already resolved by the time he was evaluated by EDP.  INR was very elevated still at 4.6.  Review of Systems: As per HPI otherwise 10 point review of systems negative.    Past Medical History  Diagnosis Date  . Mechanical heart valve present     a. Hx mechanical St Jude AVR 2000.  . Cataracts, bilateral   . OSA (obstructive sleep apnea)   . Permanent atrial fibrillation ([place])   . Ischemic cardiomyopathy   . History of echocardiogram 04/17/2013    EF 40-45%; LV hypertrophy; LV mild-mod dilated; AV regurg.; LA severely diated, RA mildly dilated  . History of Doppler ultrasound 04/05/2013    LEAs; small distal abd. aoritc aneuysm is stable; fem-pop graft to R leg has 50-69% blockage  . History of nuclear stress test 05/14/2011    Dipyridamole; moderate perfusion defect in Basal Infrioer & Mid Inferior region - consistent w/infarct or scar; global LV systolic function mildly reduced; EKG negative for ischemia; low risk scan  . COPD (chronic obstructive pulmonary disease) ([place])   . Hyperlipidemia   . Hypertension   . Kidney cysts   . CKD (chronic kidney disease), stage III   . Hiatal hernia   . GERD (gastroesophageal reflux disease)   . Prediabetes   . Gout   . AAA (abdominal aortic  aneurysm) ([place])   . Vitamin B deficiency   . Peripheral arterial disease ([place])     a. s/p aorto bifem BG;  b. 11/2013 PVA distal R Fem-fem stenosis;  c. 12/2013 PTA of dist R femoral bypass graft.  [name] CAD (coronary artery disease)     a. CABG '00. b. all SVGs occluded 2010. c. low risk Nuc 2012   . Chronic anticoagulation   . Ventricular tachycardia (paroxysmal) (HCC)     Seen by Dr. Lovena Le with EP, started on amiodarone    Past Surgical History  Procedure Laterality Date  . Stents in kidneys    . Coronary artery bypass graft  2000    3 vessel; LIMA to LAD; RCA, OM  . Shunts in femoral arteries    . Cardioversion  8/282012  . Aortic valve replacement      St. Jude  . Femoral bypass    . Cardiac catheterization  11/25/2008    loss of 2/3 (RCA, OM) bypass grafts with patent internal mammary artery to LAD; large collateral filling of RCA ; osteal narrowing of circumflex of ~50%  . Lower extremity angiogram N/A 12/03/2013    Procedure: LOWER EXTREMITY ANGIOGRAM;  Surgeon: Lorretta Harp, MD;  Location: [facility] CATH LAB;  Service: Cardiovascular;  Laterality: N/A;  . Lower extremity angiogram N/A 01/07/2014    Procedure: LOWER EXTREMITY ANGIOGRAM;  Surgeon: Lorretta Harp, MD;  Location: [facility] CATH LAB;  Service:  Cardiovascular;  Laterality: N/A;     reports that he has been smoking Cigarettes.  He has a 2 pack-year smoking history. He has never used smokeless tobacco. He reports that he does not drink alcohol or use illicit drugs.  Allergies  Allergen Reactions  . Chantix [Varenicline] Nausea And Vomiting  . Lyrica [Pregabalin]     Swelling, couldn't breathe  . Nsaids Other (See Comments)    Unknown   . Simvastatin     Pt said he had a "bleed out" after taking it  . Ziac [Bisoprolol-Hydrochlorothiazide]     fatigue    Family History  Problem Relation Age of Onset  . Heart attack Father   . Heart attack Mother   . Lung cancer Sister     x 2  . Kidney cancer Sister   . Kidney  disease Sister   . Cancer Brother     ?  . Stroke Brother      Prior to Admission medications   Medication Sig Start Date End Date Taking? Authorizing Provider  albuterol (PROVENTIL) (2.5 MG/3ML) 0.083% nebulizer solution Take 2.5 mg by nebulization every 6 (six) hours as needed for wheezing or shortness of breath.   Yes Historical Provider, MD  allopurinol (ZYLOPRIM) 300 MG tablet TAKE 1 TABLET BY MOUTH DAILY. TO PREVENT GOUT 10/02/15  Yes Unk Pinto, MD  amiodarone (PACERONE) 200 MG tablet TAKE 1 TABLET (200 MG TOTAL) BY MOUTH DAILY. 12/19/15  Yes Pixie Casino, MD  aspirin 81 MG tablet Take 81 mg by mouth daily.   Yes Historical Provider, MD  bumetanide (BUMEX) 2 MG tablet Take 1/2 tablet daily as needed for swelling 06/11/15  Yes Rhonda G Barrett, PA-C  Cholecalciferol (VITAMIN D PO) Take 5,000 Units by mouth daily.    Yes Historical Provider, MD  Cyanocobalamin (VITAMIN B 12 PO) Take 1,500 mcg by mouth daily.    Yes Historical Provider, MD  fenofibrate (TRICOR) 145 MG tablet TAKE 1 TABLET DAILY FOR BLOOD FATS 01/30/16  Yes Vicie Mutters, PA-C  ferrous sulfate 325 (65 FE) MG tablet Take 325 mg by mouth daily.   Yes Historical Provider, MD  HYDROcodone-acetaminophen (NORCO) 5-325 MG tablet Take 1 tablet by mouth every 6 (six) hours as needed. 11/27/15  Yes Courtney Forcucci, PA-C  ipratropium-albuterol (DUONEB) 0.5-2.5 (3) MG/3ML SOLN Inhale 3 mLs into the lungs every 6 (six) hours as needed (shortness of breath).  06/21/12  Yes Historical Provider, MD  isosorbide mononitrate (IMDUR) 30 MG 24 hr tablet TAKE 1 TABLET BY MOUTH EVERY DAY 01/30/16  Yes Vicie Mutters, PA-C  levothyroxine (SYNTHROID, LEVOTHROID) 50 MCG tablet TAKE 1 TABLET BY MOUTH EVERY DAY 10/02/15  Yes Unk Pinto, MD  montelukast (SINGULAIR) 10 MG tablet TAKE 1 TABLET (10 MG TOTAL) BY MOUTH DAILY. 01/01/16  Yes Unk Pinto, MD  NITROSTAT 0.4 MG SL tablet PLACE 1 TABLET (0.4 MG TOTAL) UNDER THE TONGUE EVERY 5 (FIVE)  MINUTES AS NEEDED FOR CHEST PAIN. 02/11/16  Yes Vicie Mutters, PA-C  pravastatin (PRAVACHOL) 40 MG tablet TAKE 1 TABLET (40 MG TOTAL) BY MOUTH EVERY MORNING. 11/21/15  Yes Unk Pinto, MD  ranitidine (ZANTAC) 300 MG tablet TAKE 1 TABLET (300 MG TOTAL) BY MOUTH AT BEDTIME. 12/17/15  Yes Unk Pinto, MD  sertraline (ZOLOFT) 50 MG tablet Take 1 tablet (50 mg total) by mouth daily. 09/09/15 09/08/16 Yes Vicie Mutters, PA-C  tiotropium (SPIRIVA) 18 MCG inhalation capsule Place 18 mcg into inhaler and inhale daily as needed (for shortness of  breath).    Yes Historical Provider, MD  verapamil (CALAN) 80 MG tablet TAKE 1 TABLET (80 MG TOTAL) BY MOUTH 2 (TWO) TIMES DAILY. 01/08/16  Yes Unk Pinto, MD  warfarin (COUMADIN) 5 MG tablet Take 0.5-1 tablets (2.5-5 mg total) by mouth daily. Take 2.5mg  on Monday and Friday.  5mg  all other days of the week. Patient taking differently: Take 2.5-5 mg by mouth daily.  06/11/15  Yes Rhonda G Barrett, PA-C  clotrimazole-betamethasone (LOTRISONE) cream Apply 1 application topically 2 (two) times daily. 10/21/15   Vicie Mutters, PA-C    Physical Exam: Filed Vitals:   03/02/16 0043 03/02/16 0203  BP: 141/58 156/61  Pulse: 64 60  Temp: 98.7 F (37.1 C)   TempSrc: Oral   Resp: 20 10  Weight: 107.14 kg (236 lb 3.2 oz)   SpO2: 99% 99%      Constitutional: NAD, calm, comfortable Filed Vitals:   03/02/16 0043 03/02/16 0203  BP: 141/58 156/61  Pulse: 64 60  Temp: 98.7 F (37.1 C)   TempSrc: Oral   Resp: 20 10  Weight: 107.14 kg (236 lb 3.2 oz)   SpO2: 99% 99%   Eyes: PERRL, lids and conjunctivae normal ENMT: Mucous membranes are moist. Posterior pharynx clear of any exudate or lesions.Normal dentition.  Neck: normal, supple, no masses, no thyromegaly Respiratory: clear to auscultation bilaterally, no wheezing, no crackles. Normal respiratory effort. No accessory muscle use.  Cardiovascular: Regular rate and rhythm, no murmurs / rubs / gallops. No  extremity edema. 2+ pedal pulses. No carotid bruits. Mechanical aortic valve. Abdomen: no tenderness, no masses palpated. No hepatosplenomegaly. Bowel sounds positive.  Musculoskeletal: no clubbing / cyanosis. No joint deformity upper and lower extremities. Good ROM, no contractures. Normal muscle tone.  Skin: no rashes, lesions, ulcers. No induration Neurologic: CN 2-12 grossly intact. Sensation intact, DTR normal. Strength 5/5 in all 4.  Psychiatric: Normal judgment and insight. Alert and oriented x 3. Normal mood.    Labs on Admission: I have personally reviewed following labs and imaging studies  CBC:  Recent Labs Lab 03/02/16 0055  WBC 6.9  NEUTROABS 4.1  HGB 10.7*  HCT 34.1*  MCV 92.4  PLT 0000000   Basic Metabolic Panel:  Recent Labs Lab 03/02/16 0055  NA 137  K 3.9  CL 102  CO2 27  GLUCOSE 108*  BUN 28*  CREATININE 2.94*  CALCIUM 8.9   GFR: Estimated Creatinine Clearance: 26.6 mL/min (by C-G formula based on Cr of 2.94). Liver Function Tests: No results for input(s): AST, ALT, ALKPHOS, BILITOT, PROT, ALBUMIN in the last 168 hours. No results for input(s): LIPASE, AMYLASE in the last 168 hours. No results for input(s): AMMONIA in the last 168 hours. Coagulation Profile:  Recent Labs Lab 03/02/16 0055  INR 4.61*   Cardiac Enzymes: No results for input(s): CKTOTAL, CKMB, CKMBINDEX, TROPONINI in the last 168 hours. BNP (last 3 results) No results for input(s): PROBNP in the last 8760 hours. HbA1C: No results for input(s): HGBA1C in the last 72 hours. CBG: No results for input(s): GLUCAP in the last 168 hours. Lipid Profile: No results for input(s): CHOL, HDL, LDLCALC, TRIG, CHOLHDL, LDLDIRECT in the last 72 hours. Thyroid Function Tests: No results for input(s): TSH, T4TOTAL, FREET4, T3FREE, THYROIDAB in the last 72 hours. Anemia Panel: No results for input(s): VITAMINB12, FOLATE, FERRITIN, TIBC, IRON, RETICCTPCT in the last 72 hours. Urine analysis: No  results found for: COLORURINE, APPEARANCEUR, [place], [place], [place], [place], [place], [place], [place], [place], NITRITE, LEUKOCYTESUR  Sepsis Labs: @LABRCNTIP (procalcitonin:4,lacticidven:4) )No results found for this or any previous visit (from the past 240 hour(s)).   Radiological Exams on Admission: No results found.  EKG: Independently reviewed.  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     DVT prophylaxis: SCDs, INR is supratheraputic Code Status: Full Family Communication: Son at bedside Consults called: None Admission status: Admit to obs   Ketzaly Cardella, [facility] Pager [phone] from 7PM-7AM  If 7AM-7PM, please contact the day physician for the patient [email or web address] Password [facility]  03/02/2016, 3:46 AM

## 2016-03-02 NOTE — ED Notes (Signed)
Respiratory called for pt, pt states that he wears a cpap at night. Respiratory informed.

## 2016-03-02 NOTE — Progress Notes (Signed)
ANTICOAGULATION CONSULT NOTE - Initial Consult  Pharmacy Consult for warfarin Indication: mechanical AVR, PAF  Allergies  Allergen Reactions  . Chantix [Varenicline] Nausea And Vomiting  . Lyrica [Pregabalin]     Swelling, couldn't breathe  . Nsaids Other (See Comments)    Unknown   . Simvastatin     Pt said he had a "bleed out" after taking it  . Ziac [Bisoprolol-Hydrochlorothiazide]     fatigue    Patient Measurements: Weight: 236 lb 3.2 oz (107.14 kg)  Vital Signs: Temp: 98.7 F (37.1 C) (05/16 0043) Temp Source: Oral (05/16 0043) BP: 150/72 mmHg (05/16 0830) Pulse Rate: 62 (05/16 0830)  Labs:  Recent Labs  03/02/16 0055  HGB 10.7*  HCT 34.1*  PLT 195  LABPROT 42.3*  INR 4.61*  CREATININE 2.94*    Estimated Creatinine Clearance: 26.6 mL/min (by C-G formula based on Cr of 2.94).   Medical History: Past Medical History  Diagnosis Date  . Mechanical heart valve present     a. Hx mechanical St Jude AVR 2000.  . Cataracts, bilateral   . OSA (obstructive sleep apnea)   . Permanent atrial fibrillation ([place])   . Ischemic cardiomyopathy   . History of echocardiogram 04/17/2013    EF 40-45%; LV hypertrophy; LV mild-mod dilated; AV regurg.; LA severely diated, RA mildly dilated  . History of Doppler ultrasound 04/05/2013    LEAs; small distal abd. aoritc aneuysm is stable; fem-pop graft to R leg has 50-69% blockage  . History of nuclear stress test 05/14/2011    Dipyridamole; moderate perfusion defect in Basal Infrioer & Mid Inferior region - consistent w/infarct or scar; global LV systolic function mildly reduced; EKG negative for ischemia; low risk scan  . COPD (chronic obstructive pulmonary disease) ([place])   . Hyperlipidemia   . Hypertension   . Kidney cysts   . CKD (chronic kidney disease), stage III   . Hiatal hernia   . GERD (gastroesophageal reflux disease)   . Prediabetes   . Gout   . AAA (abdominal aortic aneurysm) ([place])   . Vitamin B deficiency   .  Peripheral arterial disease ([place])     a. s/p aorto bifem BG;  b. 11/2013 PVA distal R Fem-fem stenosis;  c. 12/2013 PTA of dist R femoral bypass graft.  [name] CAD (coronary artery disease)     a. CABG '00. b. all SVGs occluded 2010. c. low risk Nuc 2012   . Chronic anticoagulation   . Ventricular tachycardia (paroxysmal) ([place])     Seen by Dr. Lovena Le with EP, started on amiodarone     Assessment: 65 yom with hx of PAF, mechanical AVR on warfarin pta (goal INR 2.5-3.5). Pharmacy consulted to continue inpatient. INR 4.61 on admit. Nose bleed on admit has stopped, FOBT neg. Noted patient is on amiodarone.  PTA dose: 5mg  MWF, 2.5mg  AOD (last dose pta "past week"  Goal of Therapy:  INR 2.5-3.5 Monitor platelets by anticoagulation protocol: Yes   Plan:  Hold warfarin tonight Daily INR Monitor CBC, s/sx bleeding  Elicia Lamp, PharmD, [facility] Clinical Pharmacist Pager [phone] 03/02/2016 9:04 AM

## 2016-03-02 NOTE — ED Notes (Signed)
PTs son states that the pt has been saying that "I have been having bright red blood" in his stool. PT also has been having "nose bleeds, when he blows his nose there is blood on the tissue, and a little trickle of blood, but it's not gushing".

## 2016-03-02 NOTE — ED Provider Notes (Signed)
CSN: [id number]     Arrival date & time 03/02/16  0027 History  By signing my name below, I, Julien Nordmann, attest that this documentation has been prepared under the direction and in the presence of Quintella Reichert, MD. Electronically Signed: Julien Nordmann, ED Scribe. 03/02/2016. 2:51 AM.    Chief Complaint  Patient presents with  . Epistaxis  . Hematochezia     The history is provided by the patient. No language interpreter was used.   HPI Comments: Robert Stanton is a 75 y.o. male who has a PMHx of mechanical heart valve, OSA, ischemic cardiomyopathy, COPD, HLD, HTN, CKD, GERD, AAA, CAD, and peripheral artery disease presents to the Emergency Department complaining of constant, gradual worsening, moderate bright red blood in stool onset today. Pt says he has been having abdominal pressure with urination and has been accumulating multiple bruises on his arms. Per son, pt reported to him seeing multiple bright red spots in his stool throughout the day. Son also states pt has a hx of rhinorrhea but today he started to have epistaxis throughout the day. Pt is currently taking 5 mg Coumadin for an aortic saint jude heart valve. He denies fever, vomiting, or dysuria.    PCP: Dr. Melford Aase Past Medical History  Diagnosis Date  . Mechanical heart valve present     a. Hx mechanical St Jude AVR 2000.  . Cataracts, bilateral   . OSA (obstructive sleep apnea)   . Permanent atrial fibrillation ([place])   . Ischemic cardiomyopathy   . History of echocardiogram 04/17/2013    EF 40-45%; LV hypertrophy; LV mild-mod dilated; AV regurg.; LA severely diated, RA mildly dilated  . History of Doppler ultrasound 04/05/2013    LEAs; small distal abd. aoritc aneuysm is stable; fem-pop graft to R leg has 50-69% blockage  . History of nuclear stress test 05/14/2011    Dipyridamole; moderate perfusion defect in Basal Infrioer & Mid Inferior region - consistent w/infarct or scar; global LV systolic function mildly  reduced; EKG negative for ischemia; low risk scan  . COPD (chronic obstructive pulmonary disease) ([place])   . Hyperlipidemia   . Hypertension   . Kidney cysts   . CKD (chronic kidney disease), stage III   . Hiatal hernia   . GERD (gastroesophageal reflux disease)   . Prediabetes   . Gout   . AAA (abdominal aortic aneurysm) ([place])   . Vitamin B deficiency   . Peripheral arterial disease ([place])     a. s/p aorto bifem BG;  b. 11/2013 PVA distal R Fem-fem stenosis;  c. 12/2013 PTA of dist R femoral bypass graft.  [name] CAD (coronary artery disease)     a. CABG '00. b. all SVGs occluded 2010. c. low risk Nuc 2012   . Chronic anticoagulation   . Ventricular tachycardia (paroxysmal) ([facility])     Seen by Dr. Lovena Le with EP, started on amiodarone   Past Surgical History  Procedure Laterality Date  . Stents in kidneys    . Coronary artery bypass graft  2000    3 vessel; LIMA to LAD; RCA, OM  . Shunts in femoral arteries    . Cardioversion  8/282012  . Aortic valve replacement      St. Jude  . Femoral bypass    . Cardiac catheterization  11/25/2008    loss of 2/3 (RCA, OM) bypass grafts with patent internal mammary artery to LAD; large collateral filling of RCA ; osteal narrowing of circumflex of ~50%  .  Lower extremity angiogram N/A 12/03/2013    Procedure: LOWER EXTREMITY ANGIOGRAM;  Surgeon: Lorretta Harp, MD;  Location: [facility] CATH LAB;  Service: Cardiovascular;  Laterality: N/A;  . Lower extremity angiogram N/A 01/07/2014    Procedure: LOWER EXTREMITY ANGIOGRAM;  Surgeon: Lorretta Harp, MD;  Location: [facility] CATH LAB;  Service: Cardiovascular;  Laterality: N/A;   Family History  Problem Relation Age of Onset  . Heart attack Father   . Heart attack Mother   . Lung cancer Sister     x 2  . Kidney cancer Sister   . Kidney disease Sister   . Cancer Brother     ?  . Stroke Brother    Social History  Substance Use Topics  . Smoking status: Current Every Day Smoker -- 0.05 packs/day for 40 years     Types: Cigarettes  . Smokeless tobacco: Never Used  . Alcohol Use: No    Review of Systems  Constitutional: Negative for fever.  Gastrointestinal: Positive for blood in stool. Negative for nausea and vomiting.  All other systems reviewed and are negative.     Allergies  Chantix; Lyrica; Nsaids; Simvastatin; and Ziac  Home Medications   Prior to Admission medications   Medication Sig Start Date End Date Taking? Authorizing Provider  albuterol (PROVENTIL) (2.5 MG/3ML) 0.083% nebulizer solution Take 2.5 mg by nebulization every 6 (six) hours as needed for wheezing or shortness of breath.   Yes Historical Provider, MD  allopurinol (ZYLOPRIM) 300 MG tablet TAKE 1 TABLET BY MOUTH DAILY. TO PREVENT GOUT 10/02/15  Yes Unk Pinto, MD  amiodarone (PACERONE) 200 MG tablet TAKE 1 TABLET (200 MG TOTAL) BY MOUTH DAILY. 12/19/15  Yes Pixie Casino, MD  aspirin 81 MG tablet Take 81 mg by mouth daily.   Yes Historical Provider, MD  bumetanide (BUMEX) 2 MG tablet Take 1/2 tablet daily as needed for swelling 06/11/15  Yes Rhonda G Barrett, PA-C  Cholecalciferol (VITAMIN D PO) Take 5,000 Units by mouth daily.    Yes Historical Provider, MD  Cyanocobalamin (VITAMIN B 12 PO) Take 1,500 mcg by mouth daily.    Yes Historical Provider, MD  fenofibrate (TRICOR) 145 MG tablet TAKE 1 TABLET DAILY FOR BLOOD FATS 01/30/16  Yes Vicie Mutters, PA-C  ferrous sulfate 325 (65 FE) MG tablet Take 325 mg by mouth daily.   Yes Historical Provider, MD  HYDROcodone-acetaminophen (NORCO) 5-325 MG tablet Take 1 tablet by mouth every 6 (six) hours as needed. 11/27/15  Yes Courtney Forcucci, PA-C  ipratropium-albuterol (DUONEB) 0.5-2.5 (3) MG/3ML SOLN Inhale 3 mLs into the lungs every 6 (six) hours as needed (shortness of breath).  06/21/12  Yes Historical Provider, MD  isosorbide mononitrate (IMDUR) 30 MG 24 hr tablet TAKE 1 TABLET BY MOUTH EVERY DAY 01/30/16  Yes Vicie Mutters, PA-C  levothyroxine (SYNTHROID, LEVOTHROID)  50 MCG tablet TAKE 1 TABLET BY MOUTH EVERY DAY 10/02/15  Yes Unk Pinto, MD  montelukast (SINGULAIR) 10 MG tablet TAKE 1 TABLET (10 MG TOTAL) BY MOUTH DAILY. 01/01/16  Yes Unk Pinto, MD  NITROSTAT 0.4 MG SL tablet PLACE 1 TABLET (0.4 MG TOTAL) UNDER THE TONGUE EVERY 5 (FIVE) MINUTES AS NEEDED FOR CHEST PAIN. 02/11/16  Yes Vicie Mutters, PA-C  pravastatin (PRAVACHOL) 40 MG tablet TAKE 1 TABLET (40 MG TOTAL) BY MOUTH EVERY MORNING. 11/21/15  Yes Unk Pinto, MD  ranitidine (ZANTAC) 300 MG tablet TAKE 1 TABLET (300 MG TOTAL) BY MOUTH AT BEDTIME. 12/17/15  Yes Unk Pinto, MD  sertraline (ZOLOFT) 50 MG tablet Take 1 tablet (50 mg total) by mouth daily. 09/09/15 09/08/16 Yes Vicie Mutters, PA-C  tiotropium (SPIRIVA) 18 MCG inhalation capsule Place 18 mcg into inhaler and inhale daily as needed (for shortness of breath).    Yes Historical Provider, MD  verapamil (CALAN) 80 MG tablet TAKE 1 TABLET (80 MG TOTAL) BY MOUTH 2 (TWO) TIMES DAILY. 01/08/16  Yes Unk Pinto, MD  warfarin (COUMADIN) 5 MG tablet Take 1-1.5 tablets (5-7.5 mg total) by mouth daily. Take 7.5 mg tonight then 5 mg daily thereafter until you follow up with your regular doctor who can adjust med further based on your blood test. 03/07/16   Venetia Maxon Rama, MD   Triage vitals: BP 156/61 mmHg  Pulse 60  Temp(Src) 98.7 F (37.1 C) (Oral)  Resp 10  Wt 236 lb 3.2 oz (107.14 kg)  SpO2 99% Physical Exam  Constitutional: He is oriented to person, place, and time. He appears well-developed and well-nourished.  HENT:  Head: Normocephalic and atraumatic.  Dried blood in bilateral nares  Cardiovascular: Normal rate and regular rhythm.   No murmur heard. Mechanical click in heart valve  Pulmonary/Chest: Effort normal and breath sounds normal. No respiratory distress.  Abdominal: Soft. There is no tenderness. There is no rebound and no guarding.  Genitourinary:  Soft, non-tender rectal exam, no gross blood noted   Musculoskeletal: He exhibits no edema or tenderness.  1+ pitting edema in bilateral lower extremities  Neurological: He is alert and oriented to person, place, and time.  Skin: Skin is warm and dry.  Psychiatric: He has a normal mood and affect. His behavior is normal.  Nursing note and vitals reviewed.   ED Course  Procedures  DIAGNOSTIC STUDIES: Oxygen Saturation is 99% on RA, normal by my interpretation.  COORDINATION OF CARE:  2:46 AM Discussed treatment plan which includes rectal exam with pt at bedside and pt agreed to plan.  Labs Review Labs Reviewed  CBC WITH DIFFERENTIAL/PLATELET - Abnormal; Notable for the following:    RBC 3.69 (*)    Hemoglobin 10.7 (*)    HCT 34.1 (*)    RDW 16.7 (*)    All other components within normal limits  BASIC METABOLIC PANEL - Abnormal; Notable for the following:    Glucose, Bld 108 (*)    BUN 28 (*)    Creatinine, Ser 2.94 (*)    GFR calc non Af Amer 20 (*)    GFR calc Af Amer 23 (*)    All other components within normal limits  PROTIME-INR - Abnormal; Notable for the following:    Prothrombin Time 42.3 (*)    INR 4.61 (*)    All other components within normal limits  PROTIME-INR - Abnormal; Notable for the following:    Prothrombin Time 25.8 (*)    INR 2.40 (*)    All other components within normal limits  CBC - Abnormal; Notable for the following:    RBC 3.50 (*)    Hemoglobin 10.1 (*)    HCT 32.2 (*)    RDW 16.6 (*)    All other components within normal limits  PROTIME-INR - Abnormal; Notable for the following:    Prothrombin Time 23.8 (*)    INR 2.14 (*)    All other components within normal limits  HEMOGLOBIN A1C - Abnormal; Notable for the following:    Hgb A1c MFr Bld 6.0 (*)    All other components within normal limits  PROTIME-INR - Abnormal;  Notable for the following:    Prothrombin Time 24.1 (*)    INR 2.18 (*)    All other components within normal limits  CBC - Abnormal; Notable for the following:    RBC  3.50 (*)    Hemoglobin 10.0 (*)    HCT 32.2 (*)    RDW 16.6 (*)    All other components within normal limits  COMPREHENSIVE METABOLIC PANEL - Abnormal; Notable for the following:    Glucose, Bld 106 (*)    BUN 27 (*)    Calcium 8.5 (*)    Albumin 3.1 (*)    Total Bilirubin 1.3 (*)    All other components within normal limits  GLUCOSE, CAPILLARY - Abnormal; Notable for the following:    Glucose-Capillary 147 (*)    All other components within normal limits  GLUCOSE, CAPILLARY - Abnormal; Notable for the following:    Glucose-Capillary 129 (*)    All other components within normal limits  GLUCOSE, CAPILLARY - Abnormal; Notable for the following:    Glucose-Capillary 125 (*)    All other components within normal limits  BASIC METABOLIC PANEL - Abnormal; Notable for the following:    Glucose, Bld 151 (*)    BUN 28 (*)    Creatinine, Ser 2.35 (*)    GFR calc non Af Amer 26 (*)    GFR calc Af Amer 30 (*)    All other components within normal limits  PROTIME-INR - Abnormal; Notable for the following:    Prothrombin Time 21.7 (*)    INR 1.90 (*)    All other components within normal limits  CBC - Abnormal; Notable for the following:    RBC 3.28 (*)    Hemoglobin 9.4 (*)    HCT 30.0 (*)    RDW 16.6 (*)    All other components within normal limits  COMPREHENSIVE METABOLIC PANEL - Abnormal; Notable for the following:    Glucose, Bld 103 (*)    BUN 28 (*)    Creatinine, Ser 2.41 (*)    Calcium 8.6 (*)    Albumin 3.1 (*)    Total Bilirubin 1.5 (*)    GFR calc non Af Amer 25 (*)    GFR calc Af Amer 29 (*)    All other components within normal limits  GLUCOSE, CAPILLARY - Abnormal; Notable for the following:    Glucose-Capillary 115 (*)    All other components within normal limits  GLUCOSE, CAPILLARY - Abnormal; Notable for the following:    Glucose-Capillary 110 (*)    All other components within normal limits  GLUCOSE, CAPILLARY - Abnormal; Notable for the following:     Glucose-Capillary 100 (*)    All other components within normal limits  GLUCOSE, CAPILLARY - Abnormal; Notable for the following:    Glucose-Capillary 127 (*)    All other components within normal limits  HEPARIN LEVEL (UNFRACTIONATED) - Abnormal; Notable for the following:    Heparin Unfractionated <0.10 (*)    All other components within normal limits  CBC - Abnormal; Notable for the following:    RBC 3.40 (*)    Hemoglobin 9.8 (*)    HCT 31.0 (*)    RDW 16.6 (*)    All other components within normal limits  COMPREHENSIVE METABOLIC PANEL - Abnormal; Notable for the following:    Chloride 100 (*)    Glucose, Bld 111 (*)    BUN 33 (*)    Creatinine, Ser 2.38 (*)  Calcium 8.8 (*)    Albumin 3.1 (*)    GFR calc non Af Amer 25 (*)    GFR calc Af Amer 29 (*)    All other components within normal limits  GLUCOSE, CAPILLARY - Abnormal; Notable for the following:    Glucose-Capillary 106 (*)    All other components within normal limits  PROTIME-INR - Abnormal; Notable for the following:    Prothrombin Time 22.3 (*)    INR 1.97 (*)    All other components within normal limits  GLUCOSE, CAPILLARY - Abnormal; Notable for the following:    Glucose-Capillary 148 (*)    All other components within normal limits  HEPARIN LEVEL (UNFRACTIONATED) - Abnormal; Notable for the following:    Heparin Unfractionated 0.24 (*)    All other components within normal limits  GLUCOSE, CAPILLARY - Abnormal; Notable for the following:    Glucose-Capillary 103 (*)    All other components within normal limits  HEPARIN LEVEL (UNFRACTIONATED) - Abnormal; Notable for the following:    Heparin Unfractionated 0.23 (*)    All other components within normal limits  GLUCOSE, CAPILLARY - Abnormal; Notable for the following:    Glucose-Capillary 111 (*)    All other components within normal limits  GLUCOSE, CAPILLARY - Abnormal; Notable for the following:    Glucose-Capillary 160 (*)    All other components  within normal limits  CBC - Abnormal; Notable for the following:    RBC 3.42 (*)    Hemoglobin 9.6 (*)    HCT 31.1 (*)    RDW 16.6 (*)    All other components within normal limits  PROTIME-INR - Abnormal; Notable for the following:    Prothrombin Time 24.1 (*)    INR 2.19 (*)    All other components within normal limits  GLUCOSE, CAPILLARY - Abnormal; Notable for the following:    Glucose-Capillary 117 (*)    All other components within normal limits  GLUCOSE, CAPILLARY - Abnormal; Notable for the following:    Glucose-Capillary 109 (*)    All other components within normal limits  GLUCOSE, CAPILLARY - Abnormal; Notable for the following:    Glucose-Capillary 100 (*)    All other components within normal limits  GLUCOSE, CAPILLARY  HEPARIN LEVEL (UNFRACTIONATED)  POC OCCULT BLOOD, ED  SAMPLE TO BLOOD BANK    Imaging Review No results found. I have personally reviewed and evaluated these images and lab results as part of my medical decision-making.   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 He has no active bleeding on examination but does have elevation in his INR to 4.6. Plan to admit for observation given his bleeding at home. Hospitalist consulted for admission.  I personally performed the services described in this documentation, which was scribed in my presence. The recorded information has been reviewed and is accurate.   Quintella Reichert, MD 03/11/16 [phone]

## 2016-03-02 NOTE — ED Notes (Signed)
Son Jeneen Rinks Nile Dear) can be contacted at   [phone]

## 2016-03-02 NOTE — ED Notes (Signed)
Pt positioned back in bed with CPAP in place.

## 2016-03-03 DIAGNOSIS — I10 Essential (primary) hypertension: Secondary | ICD-10-CM | POA: Diagnosis not present

## 2016-03-03 DIAGNOSIS — I482 Chronic atrial fibrillation: Secondary | ICD-10-CM | POA: Diagnosis not present

## 2016-03-03 DIAGNOSIS — R791 Abnormal coagulation profile: Secondary | ICD-10-CM | POA: Diagnosis not present

## 2016-03-03 LAB — CBC
HEMATOCRIT: 32.2 % — AB (ref 39.0–52.0)
Hemoglobin: 10.1 g/dL — ABNORMAL LOW (ref 13.0–17.0)
MCH: 28.9 pg (ref 26.0–34.0)
MCHC: 31.4 g/dL (ref 30.0–36.0)
MCV: 92 fL (ref 78.0–100.0)
Platelets: 207 10*3/uL (ref 150–400)
RBC: 3.5 MIL/uL — ABNORMAL LOW (ref 4.22–5.81)
RDW: 16.6 % — AB (ref 11.5–15.5)
WBC: 7.3 10*3/uL (ref 4.0–10.5)

## 2016-03-03 LAB — PROTIME-INR
INR: 2.4 — ABNORMAL HIGH (ref 0.00–1.49)
INR: 2.14 — ABNORMAL HIGH (ref 0.00–1.49)
Prothrombin Time: 25.8 seconds — ABNORMAL HIGH (ref 11.6–15.2)
Prothrombin Time: 23.8 seconds — ABNORMAL HIGH (ref 11.6–15.2)

## 2016-03-03 LAB — GLUCOSE, CAPILLARY
GLUCOSE-CAPILLARY: 147 mg/dL — AB (ref 65–99)
GLUCOSE-CAPILLARY: 129 mg/dL — AB (ref 65–99)

## 2016-03-03 MED ORDER — FAMOTIDINE 20 MG PO TABS
20.0000 mg | ORAL_TABLET | Freq: Every day | ORAL | Status: DC
  Administered 2016-03-03 – 2016-03-06 (×4): 20 mg via ORAL
  Filled 2016-03-03 (×4): qty 1

## 2016-03-03 MED ORDER — WARFARIN SODIUM 2.5 MG PO TABS
2.5000 mg | ORAL_TABLET | Freq: Once | ORAL | Status: AC
  Administered 2016-03-03: 2.5 mg via ORAL
  Filled 2016-03-03: qty 1

## 2016-03-03 MED ORDER — WARFARIN - PHARMACIST DOSING INPATIENT
Freq: Every day | Status: DC
  Administered 2016-03-03 – 2016-03-06 (×4)

## 2016-03-03 MED ORDER — INSULIN ASPART 100 UNIT/ML ~~LOC~~ SOLN: 0.0000 [IU] | Freq: Three times a day (TID) | SUBCUTANEOUS | Status: DC

## 2016-03-03 MED ORDER — SODIUM CHLORIDE 0.9 % IV SOLN
INTRAVENOUS | Status: AC
  Administered 2016-03-03: 16:00:00 via INTRAVENOUS

## 2016-03-03 NOTE — Progress Notes (Signed)
ANTICOAGULATION CONSULT NOTE - Initial Consult  Pharmacy Consult for warfarin Indication: mechanical AVR, PAF  Allergies  Allergen Reactions  . Chantix [Varenicline] Nausea And Vomiting  . Lyrica [Pregabalin]     Swelling, couldn't breathe  . Nsaids Other (See Comments)    Unknown   . Simvastatin     Pt said he had a "bleed out" after taking it  . Ziac [Bisoprolol-Hydrochlorothiazide]     fatigue    Patient Measurements: Height: 5\' 7"  (170.2 cm) Weight: 230 lb 6.4 oz (104.509 kg) IBW/kg (Calculated) : 66.1  Vital Signs: Temp: 98 F (36.7 C) (05/17 0539) Temp Source: Oral (05/17 0539) BP: 138/50 mmHg (05/17 0539) Pulse Rate: 58 (05/17 0539)  Labs:  Recent Labs  03/02/16 0055 03/03/16 0428 03/03/16 1346  HGB 10.7* 10.1*  --   HCT 34.1* 32.2*  --   PLT 195 207  --   LABPROT 42.3* 25.8* 23.8*  INR 4.61* 2.40* 2.14*  CREATININE 2.94*  --   --     Estimated Creatinine Clearance: 25.8 mL/min (by C-G formula based on Cr of 2.94).    Assessment: 14 yom with hx of PAF, mechanical AVR on warfarin pta (goal INR 2.5-3.5). Pharmacy consulted to continue inpatient. INR 4.61 on admit, held coumadin yesterday, INR down to 2.4 this morning, recheck this afternoon now down to 2.14. Hemoccult negative and nose bleed has stopped. CBC stable.   PTA dose: 5mg  MWF, 2.5mg  AOD   Goal of Therapy:  INR 2.5-3.5 Monitor platelets by anticoagulation protocol: Yes   Plan:  Coumadin 2.5 mg po x 1 Daily INR Monitor CBC, s/sx bleeding  Maryanna Shape, PharmD, BCPS  Clinical Pharmacist  Pager: [phone]   03/03/2016 2:38 PM

## 2016-03-03 NOTE — Progress Notes (Addendum)
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 male with medical history significant of DM2, HTN, CKD stage 4, AAA, PAD, mechanical aortic valve replacement on chronic coumadin. Patient presents to the ED with 1 day history of epistaxis and some bright red blood in stool onset today. Due to bleeding he didn't take his coumadin earlier in the day today and instead presented to the ED. Blood in stool is described as bright red spots on stool.  ED Course: Hemoccult negative, vitals stable, epistaxis had already resolved by the time he was evaluated by EDP. INR was very elevated still at 4.6.  Assessment and plan  Elevated INR - Continue to monitor. Pharmacy to assist with coumadin dosing. INR to be repeated this afternoon. No further bleeding reported. Resume Coumadin per pharmacy Daily INR, goal 2.5-3.5 for mechanical valve Hemoccult negative and nose bleed has stopped Will avoid rapid reversal to try and avoid clot formation unless he re-bleeds Monitor daily CBC    HTN - continue home meds  A.Fib - continue home meds, holding coumadin due to supratheraputic INR, continue amiodarone  DM2 - carb mod diet, SSI, continue Accu-Cheks, hemoglobin A1c  Chronic kidney disease stage IV, baseline is around 2.5, hydrate gently overnight    DVT prophylaxsis coumadin  Code Status:  Full code     Family Communication: Discussed in detail with the patient, all imaging results, lab results explained to the patient   Disposition Plan: anticipate dc in am        Consultants:  None   Procedures:  None   Antibiotics: Anti-infectives    None         HPI/Subjective: No epistaxsis   Objective: Filed Vitals:   03/02/16 1227 03/02/16 1435 03/02/16 2153 03/03/16 0539  BP: 160/65 179/78 133/64 138/50  Pulse: 66 68 65 58  Temp:  98.8 F (37.1 C) 97.9 F (36.6 C) 98 F (36.7 C)  TempSrc:  Oral Oral Oral  Resp: 22 18 18 16   Height:  5\' 7"  (1.702 m)    Weight:  104.509 kg (230 lb 6.4 oz)    SpO2: 94% 95% 96% 94%    Intake/Output Summary (Last 24 hours) at 03/03/16 1330 Last data filed at 03/03/16 0929  Gross per 24 hour  Intake    240 ml  Output      0 ml  Net    240 ml    Exam:  Examination:  General exam: Appears calm and comfortable  Respiratory system: Clear to auscultation. Respiratory effort normal. Cardiovascular system: S1 & S2 heard, RRR. No JVD, murmurs, rubs, gallops or clicks. No pedal edema. Gastrointestinal system: Abdomen is nondistended, soft and nontender. No organomegaly or masses felt. Normal bowel sounds heard. Central nervous system: Alert and oriented. No focal neurological deficits. Extremities: Symmetric 5 x 5 power. Skin: No rashes, lesions or ulcers Psychiatry: Judgement and insight appear normal. Mood & affect appropriate.     Data Reviewed: I have personally reviewed following labs and imaging studies  Micro Results No results found for this or any previous visit (from the past 240 hour(s)).  Radiology Reports No results found.   CBC  Recent Labs Lab 03/02/16 0055 03/03/16 0428  WBC 6.9 7.3  HGB 10.7* 10.1*  HCT 34.1* 32.2*  PLT 195 207  MCV 92.4 92.0  MCH 29.0 28.9  MCHC 31.4 31.4  RDW 16.7* 16.6*  LYMPHSABS 1.9  --   MONOABS 0.6  --   EOSABS 0.2  --   BASOSABS 0.0  --     Chemistries   Recent Labs Lab 03/02/16 0055  NA 137  K 3.9  CL 102  CO2 27  GLUCOSE 108*  BUN 28*  CREATININE 2.94*  CALCIUM 8.9    ------------------------------------------------------------------------------------------------------------------ estimated creatinine clearance is 25.8 mL/min (by C-G formula based on Cr of 2.94). ------------------------------------------------------------------------------------------------------------------ No results for input(s): HGBA1C in the last 72 hours. ------------------------------------------------------------------------------------------------------------------ No results for input(s): CHOL, HDL, LDLCALC, TRIG, CHOLHDL, LDLDIRECT in the last 72 hours. ------------------------------------------------------------------------------------------------------------------ No results for input(s): TSH, T4TOTAL, T3FREE, THYROIDAB in the last 72 hours.  Invalid input(s): FREET3 ------------------------------------------------------------------------------------------------------------------ No results for input(s): VITAMINB12, FOLATE, FERRITIN, TIBC, IRON, RETICCTPCT in the last 72 hours.  Coagulation profile  Recent Labs Lab 03/02/16 0055 03/03/16 0428  INR 4.61* 2.40*    No results for input(s): DDIMER in the last 72 hours.  Cardiac Enzymes No results for input(s): CKMB, TROPONINI, MYOGLOBIN in the last 168 hours.  Invalid input(s): CK ------------------------------------------------------------------------------------------------------------------ Invalid input(s): POCBNP   CBG: No results for input(s): GLUCAP in the last 168 hours.     Studies: No results found.    Lab Results  Component Value Date   HGBA1C 6.1* 11/21/2015   HGBA1C 5.9* 08/18/2015   HGBA1C 6.0* 06/09/2015   Lab Results  Component Value Date   MICROALBUR 7.6* 05/15/2015   LDLCALC 94 11/21/2015   CREATININE 2.94* 03/02/2016       Scheduled Meds: . allopurinol  300 mg Oral Daily  . amiodarone  200 mg Oral Daily  . aspirin EC  81 mg Oral Daily  . famotidine  40 mg Oral QHS  .  fenofibrate  160 mg Oral Daily  . ferrous sulfate  325 mg Oral Q breakfast  . isosorbide mononitrate  30 mg Oral Daily  . levothyroxine  50 mcg Oral Q breakfast  . montelukast  10 mg Oral QHS  . pravastatin  40 mg Oral q1800  . sertraline  50 mg Oral Daily  . verapamil  80 mg Oral Q12H   Continuous Infusions:       Time spent: >30 MINS    [facility]  [facility] Pager [phone]. If 7PM-7AM, please contact night-coverage at [email or web address], password [facility] 03/03/2016, 1:30 PM

## 2016-03-04 LAB — COMPREHENSIVE METABOLIC PANEL
ALT: 19 U/L (ref 17–63)
ANION GAP: 10 (ref 5–15)
AST: 23 U/L (ref 15–41)
Albumin: 3.1 g/dL — ABNORMAL LOW (ref 3.5–5.0)
Alkaline Phosphatase: 49 U/L (ref 38–126)
BILIRUBIN TOTAL: 1.3 mg/dL — AB (ref 0.3–1.2)
BUN: 27 mg/dL — AB (ref 6–20)
CHLORIDE: 103 mmol/L (ref 101–111)
CO2: 24 mmol/L (ref 22–32)
Calcium: 8.5 mg/dL — ABNORMAL LOW (ref 8.9–10.3)
Creatinine, Ser: 0.65 mg/dL (ref 0.61–1.24)
Glucose, Bld: 106 mg/dL — ABNORMAL HIGH (ref 65–99)
POTASSIUM: 4.2 mmol/L (ref 3.5–5.1)
Sodium: 137 mmol/L (ref 135–145)
TOTAL PROTEIN: 7.2 g/dL (ref 6.5–8.1)

## 2016-03-04 LAB — CBC
HCT: 32.2 % — ABNORMAL LOW (ref 39.0–52.0)
Hemoglobin: 10 g/dL — ABNORMAL LOW (ref 13.0–17.0)
MCH: 28.6 pg (ref 26.0–34.0)
MCHC: 31.1 g/dL (ref 30.0–36.0)
MCV: 92 fL (ref 78.0–100.0)
PLATELETS: 193 10*3/uL (ref 150–400)
RBC: 3.5 MIL/uL — AB (ref 4.22–5.81)
RDW: 16.6 % — ABNORMAL HIGH (ref 11.5–15.5)
WBC: 7.8 10*3/uL (ref 4.0–10.5)

## 2016-03-04 LAB — GLUCOSE, CAPILLARY
GLUCOSE-CAPILLARY: 97 mg/dL (ref 65–99)
GLUCOSE-CAPILLARY: 125 mg/dL — AB (ref 65–99)
GLUCOSE-CAPILLARY: 115 mg/dL — AB (ref 65–99)
Glucose-Capillary: 110 mg/dL — ABNORMAL HIGH (ref 65–99)

## 2016-03-04 LAB — PROTIME-INR
INR: 2.18 — ABNORMAL HIGH (ref 0.00–1.49)
Prothrombin Time: 24.1 seconds — ABNORMAL HIGH (ref 11.6–15.2)

## 2016-03-04 LAB — BASIC METABOLIC PANEL
Anion gap: 10 (ref 5–15)
BUN: 28 mg/dL — AB (ref 6–20)
CALCIUM: 8.9 mg/dL (ref 8.9–10.3)
CO2: 25 mmol/L (ref 22–32)
CREATININE: 2.35 mg/dL — AB (ref 0.61–1.24)
Chloride: 104 mmol/L (ref 101–111)
GFR calc Af Amer: 30 mL/min — ABNORMAL LOW (ref >60)
GFR, EST NON AFRICAN AMERICAN: 26 mL/min — AB (ref >60)
GLUCOSE: 151 mg/dL — AB (ref 65–99)
Potassium: 4.4 mmol/L (ref 3.5–5.1)
SODIUM: 139 mmol/L (ref 135–145)

## 2016-03-04 LAB — HEMOGLOBIN A1C
Hgb A1c MFr Bld: 6 % — ABNORMAL HIGH (ref 4.8–5.6)
MEAN PLASMA GLUCOSE: 126 mg/dL

## 2016-03-04 MED ORDER — WARFARIN SODIUM 5 MG PO TABS
5.0000 mg | ORAL_TABLET | Freq: Once | ORAL | Status: AC
  Administered 2016-03-04: 5 mg via ORAL
  Filled 2016-03-04: qty 1

## 2016-03-04 NOTE — Progress Notes (Addendum)
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 male with medical history significant of DM2, HTN, CKD stage 4, AAA, PAD, mechanical aortic valve replacement on chronic coumadin. Patient presents to the ED with 1 day history of epistaxis and some bright red blood in stool onset today. Due to bleeding he didn't take his coumadin earlier in the day today and instead presented to the ED. Blood in stool is described as bright red spots on stool.  ED Course: Hemoccult negative, vitals stable, epistaxis had already resolved by the time he was evaluated by EDP. INR was very elevated still at 4.6.  Assessment and plan  Elevated INR - Continue to monitor, now subtherpeutic . Pharmacy to assist with coumadin dosing. INR to be repeated in am . No further bleeding reported.Hold off on lovenox / heparin bridge due to epistaxis and follow INR closely    Daily INR, goal 2.5-3.5 for mechanical valve Hemoccult negative and nose bleed has stopped   HTN - continue home meds  A.Fib - continue home meds, holding coumadin due to supratheraputic INR, continue amiodarone  DM2 - carb mod diet, SSI, continue Accu-Cheks, hemoglobin A1c  Chronic kidney disease stage IV, baseline is around 2.5, patient received IV fluids overnight and the creatinine has miraculously come down to 0.65 therefore repeat BMP today and verify   DVT prophylaxsis coumadin  Code Status:  Full code     Family Communication: Discussed in detail with the patient, all imaging results, lab results explained to the patient    Disposition Plan: anticipate dc in am , when INR therapeutic       Consultants:  None   Procedures:  None   Antibiotics: Anti-infectives    None         HPI/Subjective: No epistaxsis No rectal bleeding  Objective: Filed Vitals:   03/03/16 2129 03/04/16 0529 03/04/16 1328 03/04/16 1415  BP: 162/76 146/57 162/66 145/50  Pulse: 67 59 71 65  Temp: 100 F (37.8 C) 97.8 F (36.6 C) 98.1 F (36.7 C) 99.6 F (37.6 C)  TempSrc: Oral Oral Oral Oral  Resp: 18 22  18   Height:      Weight:      SpO2: 95% 97% 96% 96%    Intake/Output Summary (Last 24 hours) at 03/04/16 1419 Last data filed at 03/04/16 1344  Gross per 24 hour  Intake 1016.25 ml  Output    825 ml  Net 191.25 ml    Exam:  Examination:  General exam: Appears calm and comfortable  Respiratory system: Clear to auscultation. Respiratory effort normal. Cardiovascular system: S1 & S2 heard, RRR. No JVD, murmurs, rubs, gallops or clicks. No pedal edema. Gastrointestinal system: Abdomen is nondistended, soft and nontender. No organomegaly or masses felt. Normal bowel sounds heard. Central nervous system: Alert and oriented. No focal neurological deficits. Extremities: Symmetric 5 x 5 power. Skin: No rashes, lesions or ulcers Psychiatry: Judgement and insight appear normal. Mood & affect appropriate.     Data Reviewed: I have personally reviewed following labs and imaging studies  Micro Results No results found for this or any  previous visit (from the past 240 hour(s)).  Radiology Reports No results found.   CBC  Recent Labs Lab 03/02/16 0055 03/03/16 0428 03/04/16 0423  WBC 6.9 7.3 7.8  HGB 10.7* 10.1* 10.0*  HCT 34.1* 32.2* 32.2*  PLT 195 207 193  MCV 92.4 92.0 92.0  MCH 29.0 28.9 28.6  MCHC 31.4 31.4 31.1  RDW 16.7* 16.6* 16.6*  LYMPHSABS 1.9  --   --   MONOABS 0.6  --   --   EOSABS 0.2  --   --   BASOSABS 0.0  --   --     Chemistries   Recent Labs Lab 03/02/16 0055  03/04/16 0423  NA 137 137  K 3.9 4.2  CL 102 103  CO2 27 24  GLUCOSE 108* 106*  BUN 28* 27*  CREATININE 2.94* 0.65  CALCIUM 8.9 8.5*  AST  --  23  ALT  --  19  ALKPHOS  --  49  BILITOT  --  1.3*   ------------------------------------------------------------------------------------------------------------------ estimated creatinine clearance is 94.8 mL/min (by C-G formula based on Cr of 0.65). ------------------------------------------------------------------------------------------------------------------  Recent Labs  03/03/16 1346  HGBA1C 6.0*   ------------------------------------------------------------------------------------------------------------------ No results for input(s): CHOL, HDL, LDLCALC, TRIG, CHOLHDL, LDLDIRECT in the last 72 hours. ------------------------------------------------------------------------------------------------------------------ No results for input(s): TSH, T4TOTAL, T3FREE, THYROIDAB in the last 72 hours.  Invalid input(s): FREET3 ------------------------------------------------------------------------------------------------------------------ No results for input(s): VITAMINB12, FOLATE, FERRITIN, TIBC, IRON, RETICCTPCT in the last 72 hours.  Coagulation profile  Recent Labs Lab 03/02/16 0055 03/03/16 0428 03/03/16 1346 03/04/16 0423  INR 4.61* 2.40* 2.14* 2.18*    No results for input(s): DDIMER in the last 72 hours.  Cardiac Enzymes No results for input(s): CKMB, TROPONINI, MYOGLOBIN in the last 168 hours.  Invalid input(s): CK ------------------------------------------------------------------------------------------------------------------ Invalid input(s): POCBNP   CBG:  Recent Labs Lab 03/03/16 1921 03/03/16 2130 03/04/16 0802 03/04/16 1152  GLUCAP 147* 129* 97 125*       Studies: No results found.    Lab Results  Component Value Date   HGBA1C 6.0* 03/03/2016   HGBA1C 6.1* 11/21/2015   HGBA1C 5.9*  08/18/2015   Lab Results  Component Value Date   MICROALBUR 7.6* 05/15/2015   LDLCALC 94 11/21/2015   CREATININE 0.65 03/04/2016       Scheduled Meds: . allopurinol  300 mg Oral Daily  . amiodarone  200 mg Oral Daily  . aspirin EC  81 mg Oral Daily  . famotidine  20 mg Oral QHS  . fenofibrate  160 mg Oral Daily  . ferrous sulfate  325 mg Oral Q breakfast  . insulin aspart  0-9 Units Subcutaneous TID WC  . isosorbide mononitrate  30 mg Oral Daily  . levothyroxine  50 mcg Oral Q breakfast  . montelukast  10 mg Oral QHS  . pravastatin  40 mg Oral q1800  . sertraline  50 mg Oral Daily  . verapamil  80 mg Oral Q12H  . warfarin  5 mg Oral ONCE-1800  . Warfarin - Pharmacist Dosing Inpatient   Does not apply q1800   Continuous Infusions:       Time spent: >30 MINS    [facility] Pager [phone]. If 7PM-7AM, please contact night-coverage at [email or web address], password [facility] 03/04/2016, 2:19 PM

## 2016-03-04 NOTE — Discharge Instructions (Signed)
Information on my medicine - Coumadin   (Warfarin)  This medication education was reviewed with me or my healthcare representative as part of my discharge preparation.  The pharmacist that spoke with me during my hospital stay was:  Duayne Cal, [facility]  Why was Coumadin prescribed for you? Coumadin was prescribed for you because you have a blood clot or a medical condition that can cause an increased risk of forming blood clots. Blood clots can cause serious health problems by blocking the flow of blood to the heart, lung, or brain. Coumadin can prevent harmful blood clots from forming. As a reminder your indication for Coumadin is:   Blood Clot Prevention After Heart Valve Surgery  What test will check on my response to Coumadin? While on Coumadin (warfarin) you will need to have an INR test regularly to ensure that your dose is keeping you in the desired range. The INR (international normalized ratio) number is calculated from the result of the laboratory test called prothrombin time (PT).  If an INR APPOINTMENT HAS NOT ALREADY BEEN MADE FOR YOU please schedule an appointment to have this lab work done by your health care provider within 7 days. Your INR goal is usually a number between:  2 to 3 or your provider may give you a more narrow range like 2-2.5.  Ask your health care provider during an office visit what your goal INR is.  What  do you need to  know  About  COUMADIN? Take Coumadin (warfarin) exactly as prescribed by your healthcare provider about the same time each day.  DO NOT stop taking without talking to the doctor who prescribed the medication.  Stopping without other blood clot prevention medication to take the place of Coumadin may increase your risk of developing a new clot or stroke.  Get refills before you run out.  What do you do if you miss a dose? If you miss a dose, take it as soon as you remember on the same day then continue your regularly scheduled regimen the next  day.  Do not take two doses of Coumadin at the same time.  Important Safety Information A possible side effect of Coumadin (Warfarin) is an increased risk of bleeding. You should call your healthcare provider right away if you experience any of the following: ? Bleeding from an injury or your nose that does not stop. ? Unusual colored urine (red or dark brown) or unusual colored stools (red or black). ? Unusual bruising for unknown reasons. ? A serious fall or if you hit your head (even if there is no bleeding).  Some foods or medicines interact with Coumadin (warfarin) and might alter your response to warfarin. To help avoid this: ? Eat a balanced diet, maintaining a consistent amount of Vitamin K. ? Notify your provider about major diet changes you plan to make. ? Avoid alcohol or limit your intake to 1 drink for women and 2 drinks for men per day. (1 drink is 5 oz. wine, 12 oz. beer, or 1.5 oz. liquor.)  Make sure that ANY health care provider who prescribes medication for you knows that you are taking Coumadin (warfarin).  Also make sure the healthcare provider who is monitoring your Coumadin knows when you have started a new medication including herbals and non-prescription products.  Coumadin (Warfarin)  Major Drug Interactions  Increased Warfarin Effect Decreased Warfarin Effect  Alcohol (large quantities) Antibiotics (esp. Septra/Bactrim, Flagyl, Cipro) Amiodarone (Cordarone) Aspirin (ASA) Cimetidine (Tagamet) Megestrol (Megace) NSAIDs (  ibuprofen, naproxen, etc.) °Piroxicam (Feldene) °Propafenone (Rythmol SR) °Propranolol (Inderal) °Isoniazid (INH) °Posaconazole (Noxafil) Barbiturates (Phenobarbital) °Carbamazepine (Tegretol) °Chlordiazepoxide (Librium) °Cholestyramine (Questran) °Griseofulvin °Oral Contraceptives °Rifampin °Sucralfate (Carafate) °Vitamin K  ° °Coumadin® (Warfarin) Major Herbal Interactions  °Increased Warfarin Effect Decreased Warfarin Effect   °Garlic °Ginseng °Ginkgo biloba Coenzyme Q10 °Green tea °St. John’s wort   ° °Coumadin® (Warfarin) FOOD Interactions  °Eat a consistent number of servings per week of foods HIGH in Vitamin K °(1 serving = ½ cup)  °Collards (cooked, or boiled & drained) °Kale (cooked, or boiled & drained) °Mustard greens (cooked, or boiled & drained) °Parsley *serving size only = ¼ cup °Spinach (cooked, or boiled & drained) °Swiss chard (cooked, or boiled & drained) °Turnip greens (cooked, or boiled & drained)  °Eat a consistent number of servings per week of foods MEDIUM-HIGH in Vitamin K °(1 serving = 1 cup)  °Asparagus (cooked, or boiled & drained) °Broccoli (cooked, boiled & drained, or raw & chopped) °Brussel sprouts (cooked, or boiled & drained) *serving size only = ½ cup °Lettuce, raw (green leaf, endive, romaine) °Spinach, raw °Turnip greens, raw & chopped  ° °These websites have more information on Coumadin (warfarin):  www.coumadin.com; °www.ahrq.gov/consumer/coumadin.htm; ° ° ° °

## 2016-03-04 NOTE — Progress Notes (Signed)
[place] for warfarin Indication: mechanical AVR, PAF  Allergies  Allergen Reactions  . Chantix [Varenicline] Nausea And Vomiting  . Lyrica [Pregabalin]     Swelling, couldn't breathe  . Nsaids Other (See Comments)    Unknown   . Simvastatin     Pt said he had a "bleed out" after taking it  . Ziac [Bisoprolol-Hydrochlorothiazide]     fatigue    Patient Measurements: Height: 5\' 7"  (170.2 cm) Weight: 230 lb 6.4 oz (104.509 kg) IBW/kg (Calculated) : 66.1  Vital Signs: Temp: 97.8 F (36.6 C) (05/18 0529) Temp Source: Oral (05/18 0529) BP: 146/57 mmHg (05/18 0529) Pulse Rate: 59 (05/18 0529)  Labs:  Recent Labs  03/02/16 0055 03/03/16 0428 03/03/16 1346 03/04/16 0423  HGB 10.7* 10.1*  --  10.0*  HCT 34.1* 32.2*  --  32.2*  PLT 195 207  --  193  LABPROT 42.3* 25.8* 23.8* 24.1*  INR 4.61* 2.40* 2.14* 2.18*  CREATININE 2.94*  --   --  0.65    Assessment: 66 yom with hx of PAF, mechanical AVR on warfarin pta (goal INR 2.5-3.5). Admitted 5/16 with INR 4.61 and epistaxis. Bleeding improved/stopped, CBC stable and warfarin resumed 5/17. Today INR is 2.18 (from 2.14) with Hgb 10 and no new reported bleeding.   PTA dose: 5mg  MWF, 2.5mg  AOD   Goal of Therapy:  INR 2.5-3.5 Monitor platelets by anticoagulation protocol: Yes   Plan:  1. Coumadin 5 mg po x 1 2. Daily INR 3. Monitor CBC, s/sx bleeding  Vincenza Hews, PharmD, BCPS 03/04/2016, 8:28 AM Pager: [phone]

## 2016-03-04 NOTE — Care Management Important Message (Deleted)
Important Message  Patient Details  Name: KAMAI NOWICKI MRN: [id number] Date of Birth: January 24, 1941   Medicare Important Message Given:  Yes    Sharin Mons, RN 03/04/2016, 11:57 AM

## 2016-03-05 DIAGNOSIS — R040 Epistaxis: Secondary | ICD-10-CM | POA: Diagnosis not present

## 2016-03-05 DIAGNOSIS — F17210 Nicotine dependence, cigarettes, uncomplicated: Secondary | ICD-10-CM | POA: Diagnosis not present

## 2016-03-05 DIAGNOSIS — I714 Abdominal aortic aneurysm, without rupture: Secondary | ICD-10-CM | POA: Diagnosis not present

## 2016-03-05 DIAGNOSIS — T45515A Adverse effect of anticoagulants, initial encounter: Secondary | ICD-10-CM | POA: Diagnosis not present

## 2016-03-05 DIAGNOSIS — K219 Gastro-esophageal reflux disease without esophagitis: Secondary | ICD-10-CM | POA: Diagnosis not present

## 2016-03-05 DIAGNOSIS — E119 Type 2 diabetes mellitus without complications: Secondary | ICD-10-CM | POA: Diagnosis not present

## 2016-03-05 DIAGNOSIS — I2510 Atherosclerotic heart disease of native coronary artery without angina pectoris: Secondary | ICD-10-CM | POA: Diagnosis not present

## 2016-03-05 DIAGNOSIS — G4733 Obstructive sleep apnea (adult) (pediatric): Secondary | ICD-10-CM | POA: Diagnosis not present

## 2016-03-05 DIAGNOSIS — I10 Essential (primary) hypertension: Secondary | ICD-10-CM | POA: Diagnosis not present

## 2016-03-05 DIAGNOSIS — N184 Chronic kidney disease, stage 4 (severe): Secondary | ICD-10-CM | POA: Diagnosis not present

## 2016-03-05 DIAGNOSIS — E785 Hyperlipidemia, unspecified: Secondary | ICD-10-CM | POA: Diagnosis not present

## 2016-03-05 DIAGNOSIS — I255 Ischemic cardiomyopathy: Secondary | ICD-10-CM | POA: Diagnosis not present

## 2016-03-05 DIAGNOSIS — J449 Chronic obstructive pulmonary disease, unspecified: Secondary | ICD-10-CM | POA: Diagnosis not present

## 2016-03-05 DIAGNOSIS — I129 Hypertensive chronic kidney disease with stage 1 through stage 4 chronic kidney disease, or unspecified chronic kidney disease: Secondary | ICD-10-CM | POA: Diagnosis not present

## 2016-03-05 DIAGNOSIS — Z7982 Long term (current) use of aspirin: Secondary | ICD-10-CM | POA: Diagnosis not present

## 2016-03-05 DIAGNOSIS — Z951 Presence of aortocoronary bypass graft: Secondary | ICD-10-CM | POA: Diagnosis not present

## 2016-03-05 DIAGNOSIS — E039 Hypothyroidism, unspecified: Secondary | ICD-10-CM | POA: Diagnosis not present

## 2016-03-05 DIAGNOSIS — K921 Melena: Secondary | ICD-10-CM | POA: Diagnosis not present

## 2016-03-05 DIAGNOSIS — Z7901 Long term (current) use of anticoagulants: Secondary | ICD-10-CM | POA: Diagnosis not present

## 2016-03-05 DIAGNOSIS — R791 Abnormal coagulation profile: Secondary | ICD-10-CM | POA: Diagnosis not present

## 2016-03-05 DIAGNOSIS — H269 Unspecified cataract: Secondary | ICD-10-CM | POA: Diagnosis not present

## 2016-03-05 DIAGNOSIS — Z952 Presence of prosthetic heart valve: Secondary | ICD-10-CM | POA: Diagnosis not present

## 2016-03-05 DIAGNOSIS — I482 Chronic atrial fibrillation: Secondary | ICD-10-CM | POA: Diagnosis not present

## 2016-03-05 DIAGNOSIS — E1122 Type 2 diabetes mellitus with diabetic chronic kidney disease: Secondary | ICD-10-CM | POA: Diagnosis not present

## 2016-03-05 DIAGNOSIS — I739 Peripheral vascular disease, unspecified: Secondary | ICD-10-CM | POA: Diagnosis not present

## 2016-03-05 LAB — CBC
HCT: 30 % — ABNORMAL LOW (ref 39.0–52.0)
HEMOGLOBIN: 9.4 g/dL — AB (ref 13.0–17.0)
MCH: 28.7 pg (ref 26.0–34.0)
MCHC: 31.3 g/dL (ref 30.0–36.0)
MCV: 91.5 fL (ref 78.0–100.0)
Platelets: 189 10*3/uL (ref 150–400)
RBC: 3.28 MIL/uL — AB (ref 4.22–5.81)
RDW: 16.6 % — ABNORMAL HIGH (ref 11.5–15.5)
WBC: 7 10*3/uL (ref 4.0–10.5)

## 2016-03-05 LAB — GLUCOSE, CAPILLARY
GLUCOSE-CAPILLARY: 100 mg/dL — AB (ref 65–99)
GLUCOSE-CAPILLARY: 127 mg/dL — AB (ref 65–99)
Glucose-Capillary: 106 mg/dL — ABNORMAL HIGH (ref 65–99)
Glucose-Capillary: 148 mg/dL — ABNORMAL HIGH (ref 65–99)

## 2016-03-05 LAB — COMPREHENSIVE METABOLIC PANEL
ALK PHOS: 50 U/L (ref 38–126)
ALT: 23 U/L (ref 17–63)
AST: 26 U/L (ref 15–41)
Albumin: 3.1 g/dL — ABNORMAL LOW (ref 3.5–5.0)
Anion gap: 8 (ref 5–15)
BILIRUBIN TOTAL: 1.5 mg/dL — AB (ref 0.3–1.2)
BUN: 28 mg/dL — ABNORMAL HIGH (ref 6–20)
CALCIUM: 8.6 mg/dL — AB (ref 8.9–10.3)
CO2: 26 mmol/L (ref 22–32)
CREATININE: 2.41 mg/dL — AB (ref 0.61–1.24)
Chloride: 102 mmol/L (ref 101–111)
GFR, EST AFRICAN AMERICAN: 29 mL/min — AB (ref >60)
GFR, EST NON AFRICAN AMERICAN: 25 mL/min — AB (ref >60)
Glucose, Bld: 103 mg/dL — ABNORMAL HIGH (ref 65–99)
Potassium: 3.9 mmol/L (ref 3.5–5.1)
Sodium: 136 mmol/L (ref 135–145)
TOTAL PROTEIN: 6.8 g/dL (ref 6.5–8.1)

## 2016-03-05 LAB — PROTIME-INR
INR: 1.9 — AB (ref 0.00–1.49)
PROTHROMBIN TIME: 21.7 s — AB (ref 11.6–15.2)

## 2016-03-05 MED ORDER — WARFARIN SODIUM 7.5 MG PO TABS
7.5000 mg | ORAL_TABLET | Freq: Once | ORAL | Status: AC
  Administered 2016-03-05: 7.5 mg via ORAL
  Filled 2016-03-05: qty 1

## 2016-03-05 MED ORDER — WARFARIN SODIUM 6 MG PO TABS
6.0000 mg | ORAL_TABLET | Freq: Once | ORAL | Status: DC
  Filled 2016-03-05: qty 1

## 2016-03-05 MED ORDER — HEPARIN (PORCINE) IN NACL 100-0.45 UNIT/ML-% IJ SOLN
1750.0000 [IU]/h | INTRAMUSCULAR | Status: DC
  Administered 2016-03-05: 1250 [IU]/h via INTRAVENOUS
  Administered 2016-03-06: 1600 [IU]/h via INTRAVENOUS
  Filled 2016-03-05 (×2): qty 250

## 2016-03-05 NOTE — Progress Notes (Signed)
ANTICOAGULATION CONSULT NOTE - Initial Consult  Pharmacy Consult for Heparin Indication: Mechanical heart valve, AVR, PAF  Allergies  Allergen Reactions  . Chantix [Varenicline] Nausea And Vomiting  . Lyrica [Pregabalin]     Swelling, couldn't breathe  . Nsaids Other (See Comments)    Unknown   . Simvastatin     Pt said he had a "bleed out" after taking it  . Ziac [Bisoprolol-Hydrochlorothiazide]     fatigue    Patient Measurements: Height: 5\' 7"  (170.2 cm) Weight: 230 lb 6.4 oz (104.509 kg) IBW/kg (Calculated) : 66.1 Heparin Dosing Weight: 89 kg  Vital Signs: Temp: 98.4 F (36.9 C) (05/19 1415) Temp Source: Oral (05/19 1415) BP: 130/66 mmHg (05/19 1415) Pulse Rate: 68 (05/19 1415)  Labs:  Recent Labs  03/03/16 0428 03/03/16 1346 03/04/16 0423 03/04/16 1427 03/05/16 0715  HGB 10.1*  --  10.0*  --  9.4*  HCT 32.2*  --  32.2*  --  30.0*  PLT 207  --  193  --  189  LABPROT 25.8* 23.8* 24.1*  --  21.7*  INR 2.40* 2.14* 2.18*  --  1.90*  CREATININE  --   --  0.65 2.35* 2.41*    Estimated Creatinine Clearance: 31.5 mL/min (by C-G formula based on Cr of 2.41).   Medical History: Past Medical History  Diagnosis Date  . Mechanical heart valve present     a. Hx mechanical [employer] AVR 2000.  . Cataracts, bilateral   . OSA (obstructive sleep apnea)   . Permanent atrial fibrillation ([place])   . Ischemic cardiomyopathy   . History of echocardiogram 04/17/2013    EF 40-45%; LV hypertrophy; LV mild-mod dilated; AV regurg.; LA severely diated, RA mildly dilated  . History of Doppler ultrasound 04/05/2013    LEAs; small distal abd. aoritc aneuysm is stable; fem-pop graft to R leg has 50-69% blockage  . History of nuclear stress test 05/14/2011    Dipyridamole; moderate perfusion defect in Basal Infrioer & Mid Inferior region - consistent w/infarct or scar; global LV systolic function mildly reduced; EKG negative for ischemia; low risk scan  . COPD (chronic obstructive  pulmonary disease) ([place])   . Hyperlipidemia   . Hypertension   . Kidney cysts   . CKD (chronic kidney disease), stage III   . Hiatal hernia   . GERD (gastroesophageal reflux disease)   . Prediabetes   . Gout   . AAA (abdominal aortic aneurysm) ([place])   . Vitamin B deficiency   . Peripheral arterial disease ([place])     a. s/p aorto bifem BG;  b. 11/2013 PVA distal R Fem-fem stenosis;  c. 12/2013 PTA of dist R femoral bypass graft.  [name] CAD (coronary artery disease)     a. CABG '00. b. all SVGs occluded 2010. c. low risk Nuc 2012   . Chronic anticoagulation   . Ventricular tachycardia (paroxysmal) (H[facility])     Seen by Dr. Lovena Le with EP, started on amiodarone    Assessment:  75 y/o with hx of PAF, mechanical AVR on warfarin pta. Admitted 5/16 with INR 4.61 and epistaxis and bright red blood in stool. Bleeding stopped, CBC stable and warfarin resumed 5/17. Today INR is 1.9 with Hgb 9.4 and no new reported bleeding. Start IV heparin to bridge until INR>2.   Goal of Therapy:  Heparin level goal 0.3-0.7 Monitor platelets by anticoagulation protocol: Yes   Plan:  Start IV heparin with no bolus at 1250 units/hr Check HL in 6-8 hrs  Daily HL and CBC   Johnice Riebe S. Alford Highland, PharmD, BCPS Clinical Staff Pharmacist Pager [phone]  Eilene Ghazi Stillinger 03/05/2016,3:08 PM

## 2016-03-05 NOTE — Progress Notes (Addendum)
[place] for warfarin Indication: mechanical AVR, PAF  Allergies  Allergen Reactions  . Chantix [Varenicline] Nausea And Vomiting  . Lyrica [Pregabalin]     Swelling, couldn't breathe  . Nsaids Other (See Comments)    Unknown   . Simvastatin     Pt said he had a "bleed out" after taking it  . Ziac [Bisoprolol-Hydrochlorothiazide]     fatigue    Patient Measurements: Height: 5\' 7"  (170.2 cm) Weight: 230 lb 6.4 oz (104.509 kg) IBW/kg (Calculated) : 66.1  Vital Signs: Temp: 97.7 F (36.5 C) (05/19 0535) Temp Source: Oral (05/19 0535) BP: 145/62 mmHg (05/19 0535) Pulse Rate: 59 (05/19 0535)  Labs:  Recent Labs  03/03/16 0428 03/03/16 1346 03/04/16 0423 03/04/16 1427 03/05/16 0715  HGB 10.1*  --  10.0*  --  9.4*  HCT 32.2*  --  32.2*  --  30.0*  PLT 207  --  193  --  189  LABPROT 25.8* 23.8* 24.1*  --  21.7*  INR 2.40* 2.14* 2.18*  --  1.90*  CREATININE  --   --  0.65 2.35* 2.41*    Assessment: 15 yom with hx of PAF, mechanical AVR on warfarin pta (goal INR 2.5-3.5). Admitted 5/16 with INR 4.61 and epistaxis. Bleeding stopped, CBC stable and warfarin resumed 5/17. Today INR is 1.9 with Hgb 9.4 and no new reported bleeding.   PTA dose: 5mg  daily  Goal of Therapy:  INR 2.5-3.5 Monitor platelets by anticoagulation protocol: Yes   Plan:  1. Coumadin 7.5 mg po x 1 2. Daily INR 3. Monitor CBC, s/sx bleeding  Maryanna Shape, PharmD, BCPS  Clinical Pharmacist  Pager: [phone]  03/05/2016, 1:41 PM

## 2016-03-05 NOTE — Progress Notes (Signed)
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 male with medical history significant of DM2, HTN, CKD stage 4, AAA, PAD, mechanical aortic valve replacement on chronic coumadin. Patient presents to the ED with 1 day history of epistaxis and some bright red blood in stool onset today. Due to bleeding he didn't take his coumadin earlier in the day today and instead presented to the ED. Blood in stool is described as bright red spots on stool.  ED Course: Hemoccult negative, vitals stable, epistaxis had already resolved by the time he was evaluated by EDP. INR was very elevated still at 4.6.  Assessment and plan Elevated INR - Continue to monitor, now subtherpeutic . Pharmacy to assist with coumadin dosing. INR to be repeated in am . No further bleeding reported. Started  heparin bridge due to resolution of  epistaxis and downtrending INR   Daily INR, goal 2.5-3.5 for mechanical valve Hemoccult negative and nose bleed has stopped   HTN - continue home meds  A.Fib - continue home meds, holding coumadin due to supratheraputic INR, continue amiodarone  DM2 - carb mod diet, SSI, continue Accu-Cheks, hemoglobin A1c 6.0  Chronic kidney disease stage IV, baseline is around 2.5, patient received IV fluids overnight and the creatinine has miraculously come down to 0.65 therefore repeat BMP today and verify   DVT prophylaxsis coumadin  Code Status:  Full code     Family Communication: Discussed in detail with the patient, all imaging results, lab results explained to the patient    Disposition Plan: anticipate dc in am , when INR therapeutic       Consultants:  None   Procedures:  None   Antibiotics: Anti-infectives    None         HPI/Subjective: No epistaxsis No rectal bleeding  Objective: Filed Vitals:   03/04/16 1415 03/04/16 2117 03/05/16 0535 03/05/16 1415  BP: 145/50 141/57 145/62 130/66  Pulse: 65 65 59 68  Temp: 99.6 F (37.6 C) 98.2 F (36.8 C) 97.7 F (36.5 C) 98.4 F (36.9 C)  TempSrc: Oral Oral Oral Oral  Resp: 18 18 18 20   Height:      Weight:      SpO2: 96% 93% 97% 94%    Intake/Output Summary (Last 24 hours) at 03/05/16 1615 Last data filed at 03/05/16 1417  Gross per 24 hour  Intake    775 ml  Output    725 ml  Net     50 ml    Exam:  Examination:  General exam: Appears calm and comfortable  Respiratory system: Clear to auscultation. Respiratory effort normal. Cardiovascular system: S1 & S2 heard, RRR. No JVD, murmurs, rubs, gallops or clicks. No pedal edema. Gastrointestinal system: Abdomen is nondistended, soft and nontender. No organomegaly or masses felt. Normal bowel sounds heard. Central nervous system: Alert and oriented. No focal neurological deficits. Extremities: Symmetric 5 x 5 power. Skin: No rashes, lesions or ulcers Psychiatry: Judgement and insight appear normal. Mood & affect appropriate.     Data Reviewed: I have personally reviewed following labs and imaging studies  Micro Results No  results found for this or any previous visit (from the past 240 hour(s)).  Radiology Reports No results found.   CBC  Recent Labs Lab 03/02/16 0055 03/03/16 0428 03/04/16 0423 03/05/16 0715  WBC 6.9 7.3 7.8 7.0  HGB 10.7* 10.1* 10.0* 9.4*  HCT 34.1* 32.2* 32.2* 30.0*  PLT 195 207 193 189  MCV 92.4 92.0 92.0 91.5  MCH 29.0 28.9 28.6 28.7  MCHC 31.4 31.4 31.1 31.3  RDW 16.7* 16.6* 16.6* 16.6*  LYMPHSABS 1.9  --   --   --   MONOABS 0.6  --   --   --   EOSABS 0.2  --   --   --   BASOSABS  0.0  --   --   --     Chemistries   Recent Labs Lab 03/02/16 0055 03/04/16 0423 03/04/16 1427 03/05/16 0715  NA 137 137 139 136  K 3.9 4.2 4.4 3.9  CL 102 103 104 102  CO2 27 24 25 26   GLUCOSE 108* 106* 151* 103*  BUN 28* 27* 28* 28*  CREATININE 2.94* 0.65 2.35* 2.41*  CALCIUM 8.9 8.5* 8.9 8.6*  AST  --  23  --  26  ALT  --  19  --  23  ALKPHOS  --  49  --  50  BILITOT  --  1.3*  --  1.5*   ------------------------------------------------------------------------------------------------------------------ estimated creatinine clearance is 31.5 mL/min (by C-G formula based on Cr of 2.41). ------------------------------------------------------------------------------------------------------------------  Recent Labs  03/03/16 1346  HGBA1C 6.0*   ------------------------------------------------------------------------------------------------------------------ No results for input(s): CHOL, HDL, LDLCALC, TRIG, CHOLHDL, LDLDIRECT in the last 72 hours. ------------------------------------------------------------------------------------------------------------------ No results for input(s): TSH, T4TOTAL, T3FREE, THYROIDAB in the last 72 hours.  Invalid input(s): FREET3 ------------------------------------------------------------------------------------------------------------------ No results for input(s): VITAMINB12, FOLATE, FERRITIN, TIBC, IRON, RETICCTPCT in the last 72 hours.  Coagulation profile  Recent Labs Lab 03/02/16 0055 03/03/16 0428 03/03/16 1346 03/04/16 0423 03/05/16 0715  INR 4.61* 2.40* 2.14* 2.18* 1.90*    No results for input(s): DDIMER in the last 72 hours.  Cardiac Enzymes No results for input(s): CKMB, TROPONINI, MYOGLOBIN in the last 168 hours.  Invalid input(s): CK ------------------------------------------------------------------------------------------------------------------ Invalid input(s): POCBNP   CBG:  Recent Labs Lab  03/04/16 1152 03/04/16 1707 03/04/16 2115 03/05/16 0739 03/05/16 1141  GLUCAP 125* 115* 110* 100* 127*       Studies: No results found.    Lab Results  Component Value Date   HGBA1C 6.0* 03/03/2016   HGBA1C 6.1* 11/21/2015   HGBA1C 5.9* 08/18/2015   Lab Results  Component Value Date   MICROALBUR 7.6* 05/15/2015   LDLCALC 94 11/21/2015   CREATININE 2.41* 03/05/2016       Scheduled Meds: . allopurinol  300 mg Oral Daily  . amiodarone  200 mg Oral Daily  . aspirin EC  81 mg Oral Daily  . famotidine  20 mg Oral QHS  . fenofibrate  160 mg Oral Daily  . ferrous sulfate  325 mg Oral Q breakfast  . insulin aspart  0-9 Units Subcutaneous TID WC  . isosorbide mononitrate  30 mg Oral Daily  . levothyroxine  50 mcg Oral Q breakfast  . montelukast  10 mg Oral QHS  . pravastatin  40 mg Oral q1800  . sertraline  50 mg Oral Daily  . verapamil  80 mg Oral Q12H  . warfarin  7.5 mg Oral ONCE-1800  . Warfarin - Pharmacist Dosing Inpatient   Does not apply q1800   Continuous Infusions: .  heparin 1,250 Units/hr (03/05/16 1541)        Time spent: >30 MINS    [facility] Pager [phone]. If 7PM-7AM, please contact night-coverage at [email or web address], password [email or web address] 03/05/2016, 4:15 PM

## 2016-03-06 DIAGNOSIS — I482 Chronic atrial fibrillation: Secondary | ICD-10-CM

## 2016-03-06 DIAGNOSIS — R040 Epistaxis: Secondary | ICD-10-CM | POA: Diagnosis present

## 2016-03-06 DIAGNOSIS — N184 Chronic kidney disease, stage 4 (severe): Secondary | ICD-10-CM

## 2016-03-06 DIAGNOSIS — Z952 Presence of prosthetic heart valve: Secondary | ICD-10-CM

## 2016-03-06 DIAGNOSIS — E1122 Type 2 diabetes mellitus with diabetic chronic kidney disease: Secondary | ICD-10-CM

## 2016-03-06 DIAGNOSIS — I129 Hypertensive chronic kidney disease with stage 1 through stage 4 chronic kidney disease, or unspecified chronic kidney disease: Secondary | ICD-10-CM

## 2016-03-06 DIAGNOSIS — I10 Essential (primary) hypertension: Secondary | ICD-10-CM

## 2016-03-06 DIAGNOSIS — R791 Abnormal coagulation profile: Secondary | ICD-10-CM

## 2016-03-06 LAB — HEPARIN LEVEL (UNFRACTIONATED)
HEPARIN UNFRACTIONATED: 0.23 [IU]/mL — AB (ref 0.30–0.70)
Heparin Unfractionated: <0.1 [IU]/mL — ABNORMAL LOW (ref 0.30–0.70)
Heparin Unfractionated: 0.24 IU/mL — ABNORMAL LOW (ref 0.30–0.70)

## 2016-03-06 LAB — CBC
HCT: 31 % — ABNORMAL LOW (ref 39.0–52.0)
Hemoglobin: 9.8 g/dL — ABNORMAL LOW (ref 13.0–17.0)
MCH: 28.8 pg (ref 26.0–34.0)
MCHC: 31.6 g/dL (ref 30.0–36.0)
MCV: 91.2 fL (ref 78.0–100.0)
Platelets: 196 K/uL (ref 150–400)
RBC: 3.4 MIL/uL — ABNORMAL LOW (ref 4.22–5.81)
RDW: 16.6 % — ABNORMAL HIGH (ref 11.5–15.5)
WBC: 5.8 K/uL (ref 4.0–10.5)

## 2016-03-06 LAB — PROTIME-INR
INR: 1.97 — ABNORMAL HIGH (ref 0.00–1.49)
Prothrombin Time: 22.3 s — ABNORMAL HIGH (ref 11.6–15.2)

## 2016-03-06 LAB — COMPREHENSIVE METABOLIC PANEL WITH GFR
ALT: 22 U/L (ref 17–63)
AST: 24 U/L (ref 15–41)
Albumin: 3.1 g/dL — ABNORMAL LOW (ref 3.5–5.0)
Alkaline Phosphatase: 53 U/L (ref 38–126)
Anion gap: 13 (ref 5–15)
BUN: 33 mg/dL — ABNORMAL HIGH (ref 6–20)
CO2: 24 mmol/L (ref 22–32)
Calcium: 8.8 mg/dL — ABNORMAL LOW (ref 8.9–10.3)
Chloride: 100 mmol/L — ABNORMAL LOW (ref 101–111)
Creatinine, Ser: 2.38 mg/dL — ABNORMAL HIGH (ref 0.61–1.24)
GFR calc Af Amer: 29 mL/min — ABNORMAL LOW
GFR calc non Af Amer: 25 mL/min — ABNORMAL LOW
Glucose, Bld: 111 mg/dL — ABNORMAL HIGH (ref 65–99)
Potassium: 3.9 mmol/L (ref 3.5–5.1)
Sodium: 137 mmol/L (ref 135–145)
Total Bilirubin: 1 mg/dL (ref 0.3–1.2)
Total Protein: 6.7 g/dL (ref 6.5–8.1)

## 2016-03-06 LAB — GLUCOSE, CAPILLARY
GLUCOSE-CAPILLARY: 103 mg/dL — AB (ref 65–99)
GLUCOSE-CAPILLARY: 117 mg/dL — AB (ref 65–99)
Glucose-Capillary: 111 mg/dL — ABNORMAL HIGH (ref 65–99)
Glucose-Capillary: 160 mg/dL — ABNORMAL HIGH (ref 65–99)

## 2016-03-06 MED ORDER — WARFARIN SODIUM 7.5 MG PO TABS
7.5000 mg | ORAL_TABLET | Freq: Once | ORAL | Status: AC
  Administered 2016-03-06: 7.5 mg via ORAL
  Filled 2016-03-06: qty 1

## 2016-03-06 MED ORDER — HEPARIN (PORCINE) IN NACL 100-0.45 UNIT/ML-% IJ SOLN
1950.0000 [IU]/h | INTRAMUSCULAR | Status: DC
  Administered 2016-03-06 – 2016-03-07 (×2): 1950 [IU]/h via INTRAVENOUS
  Filled 2016-03-06 (×2): qty 250

## 2016-03-06 NOTE — Progress Notes (Signed)
ANTICOAGULATION CONSULT NOTE - Follow-up Consult  Pharmacy Consult for Heparin Indication: Mechanical heart valve, AVR, PAF  Allergies  Allergen Reactions  . Chantix [Varenicline] Nausea And Vomiting  . Lyrica [Pregabalin]     Swelling, couldn't breathe  . Nsaids Other (See Comments)    Unknown   . Simvastatin     Pt said he had a "bleed out" after taking it  . Ziac [Bisoprolol-Hydrochlorothiazide]     fatigue    Patient Measurements: Height: 5\' 7"  (170.2 cm) Weight: 230 lb 6.4 oz (104.509 kg) IBW/kg (Calculated) : 66.1 Heparin Dosing Weight: 89 kg  Vital Signs: Temp: 98.3 F (36.8 C) (05/19 2209) Temp Source: Oral (05/19 1415) BP: 145/57 mmHg (05/19 2209) Pulse Rate: 59 (05/19 2209)  Labs:  Recent Labs  03/03/16 0428 03/03/16 1346 03/04/16 0423 03/04/16 1427 03/05/16 0715 03/05/16 2346  HGB 10.1*  --  10.0*  --  9.4*  --   HCT 32.2*  --  32.2*  --  30.0*  --   PLT 207  --  193  --  189  --   LABPROT 25.8* 23.8* 24.1*  --  21.7*  --   INR 2.40* 2.14* 2.18*  --  1.90*  --   HEPARINUNFRC  --   --   --   --   --  <0.10*  CREATININE  --   --  0.65 2.35* 2.41*  --     Estimated Creatinine Clearance: 31.5 mL/min (by C-G formula based on Cr of 2.41).   Assessment: 75 y/o with hx of PAF, mechanical AVR on warfarin pta. Admitted 5/16 with INR 4.61 and epistaxis and bright red blood in stool. Bleeding resolved, CBC stable and warfarin resumed 5/17.  INR down to 1.9 and no new reported bleeding so heparin bridge started 5/19 (will be continued until INR >/= 2.5 as goal 2.5-3.5 with mech AVR + afib). Heparin level undetectable on 1250 units/hr. No issues with line or bleeding reported per RN.  Goal of Therapy:  Heparin level goal 0.3-0.7; INR 2.5.3.5 Monitor platelets by anticoagulation protocol: Yes   Plan:  Increase heparin to 1600 units/hr F/u heparin level in 8 hours  Sherlon Handing, PharmD, BCPS Clinical pharmacist, pager [phone]  03/06/2016,1:52 AM

## 2016-03-06 NOTE — Progress Notes (Signed)
Progress Note    Robert Stanton  [id number] DOB: 12-Nov-1940  DOA: 03/02/2016 PCP: Alesia Richards, MD    Brief Narrative:   Robert Stanton is an 75 y.o. male with A PMH significant for DM2, HTN, CKD stage 4, AAA, PAD, mechanical aortic valve replacement on chronic coumadin who was admitted 03/02/16 for treatment of epistaxis and bloody stools. His INR was elevated at 4.6.  Assessment/Plan:   Principal Problem:   Elevated INR with epistaxis and rectal bleeding on chronic anticoagulation secondary to mechanical aortic valve and atrial fibrillation The patient's INR is now subtherapeutic and he has been started on heparin bridge. INR goal 2.5-3.5. Stools were Hemoccult negative and his epistaxis has resolved with no evidence of further ongoing problems with bleeding.  Active Problems:   Essential hypertension Continue verapamil.    Permanent atrial fibrillation (HCC) Continue amiodarone and verapamil. Anticoagulated.    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    Stage IV chronic kidney disease Creatinine currently consistent with usual baseline values. Spurious improvement of creatinine to 0.63 likely a lab error.    Hypothyroidism Continue Synthroid.    OSA Continue nocturnal CPAP.   Family Communication/Anticipated D/C date and plan/Code Status   DVT prophylaxis: Coumadin/heparin bridge ordered. Code Status: Full Code.  Family Communication: No family at the bedside. Disposition Plan: Home when INR therapeutic.   Medical Consultants:    None.   Procedures:    Anti-Infectives:   Anti-infectives    None      Subjective:   Robert Stanton denies any further epistaxis or bloody stools.  Appetite OK.  No dyspnea or pain.  Objective:    Filed Vitals:   03/05/16 0535 03/05/16 1415 03/05/16 2209 03/06/16 0523  BP: 145/62 130/66 145/57 133/62  Pulse: 59 68 59 60  Temp:  97.7 F (36.5 C) 98.4 F (36.9 C) 98.3 F (36.8 C) 97.5 F (36.4 C)  TempSrc: Oral Oral    Resp: 18 20 18 18   Height:      Weight:      SpO2: 97% 94% 96% 97%    Intake/Output Summary (Last 24 hours) at 03/06/16 0938 Last data filed at 03/06/16 0852  Gross per 24 hour  Intake 376.68 ml  Output   1325 ml  Net -948.32 ml   Filed Weights   03/02/16 0043 03/02/16 1435  Weight: 107.14 kg (236 lb 3.2 oz) 104.509 kg (230 lb 6.4 oz)    Exam: General exam: Appears calm and comfortable, sitting up in chair. Respiratory system: Clear to auscultation. Respiratory effort normal. Cardiovascular system: S1 & S2 heard, RRR. No JVD,  rubs, gallops or clicks. II/VI SEM Gastrointestinal system: Abdomen is nondistended, soft and nontender. No organomegaly or masses felt. Normal bowel sounds heard. Central nervous system: Alert and oriented. No focal neurological deficits. Extremities: No clubbing, edema, or cyanosis. Skin: No rashes, lesions or ulcers Psychiatry: Judgement and insight appear normal. Mood & affect appropriate.   Data Reviewed:   I have personally reviewed following labs and imaging studies:  Labs: Basic Metabolic Panel:  Recent Labs Lab 03/02/16 0055 03/04/16 0423 03/04/16 1427 03/05/16 0715 03/06/16 0643  NA 137 137 139 136 137  K 3.9 4.2 4.4 3.9 3.9  CL 102 103 104 102 100*  CO2 27 24 25 26 24   GLUCOSE 108* 106* 151* 103* 111*  BUN 28* 27* 28* 28* 33*  CREATININE 2.94* 0.65  2.35* 2.41* 2.38*  CALCIUM 8.9 8.5* 8.9 8.6* 8.8*   GFR Estimated Creatinine Clearance: 31.9 mL/min (by C-G formula based on Cr of 2.38). Liver Function Tests:  Recent Labs Lab 03/04/16 0423 03/05/16 0715 03/06/16 0643  AST 23 26 24   ALT 19 23 22   ALKPHOS 49 50 53  BILITOT 1.3* 1.5* 1.0  PROT 7.2 6.8 6.7  ALBUMIN 3.1* 3.1* 3.1*   Coagulation profile  Recent Labs Lab 03/03/16 0428 03/03/16 1346 03/04/16 0423 03/05/16 0715 03/06/16 0643  INR 2.40* 2.14* 2.18* 1.90* 1.97*     CBC:  Recent Labs Lab 03/02/16 0055 03/03/16 0428 03/04/16 0423 03/05/16 0715 03/06/16 0643  WBC 6.9 7.3 7.8 7.0 5.8  NEUTROABS 4.1  --   --   --   --   HGB 10.7* 10.1* 10.0* 9.4* 9.8*  HCT 34.1* 32.2* 32.2* 30.0* 31.0*  MCV 92.4 92.0 92.0 91.5 91.2  PLT 195 207 193 189 196   CBG:  Recent Labs Lab 03/05/16 0739 03/05/16 1141 03/05/16 1718 03/05/16 2207 03/06/16 0741  GLUCAP 100* 127* 106* 148* 103*   Hgb A1c:  Recent Labs  03/03/16 1346  HGBA1C 6.0*   Microbiology No results found for this or any previous visit (from the past 240 hour(s)).  Radiology: No results found.  Medications:   . allopurinol  300 mg Oral Daily  . amiodarone  200 mg Oral Daily  . aspirin EC  81 mg Oral Daily  . famotidine  20 mg Oral QHS  . fenofibrate  160 mg Oral Daily  . ferrous sulfate  325 mg Oral Q breakfast  . insulin aspart  0-9 Units Subcutaneous TID WC  . isosorbide mononitrate  30 mg Oral Daily  . levothyroxine  50 mcg Oral Q breakfast  . montelukast  10 mg Oral QHS  . pravastatin  40 mg Oral q1800  . sertraline  50 mg Oral Daily  . verapamil  80 mg Oral Q12H  . Warfarin - Pharmacist Dosing Inpatient   Does not apply q1800   Continuous Infusions: . heparin 1,600 Units/hr (03/06/16 0159)    Time spent: 25 minutes.   LOS: 1 day   RAMA,CHRISTINA  [facility] Pager [phone]. If unable to reach me by pager, please call my cell phone at [phone].  *Please refer to [email or web address], password [email or web address] to get updated schedule on who will round on this patient, as hospitalists switch teams weekly. If 7PM-7AM, please contact night-coverage at [email or web address], password [email or web address] for any overnight needs.  03/06/2016, 9:38 AM

## 2016-03-06 NOTE — Procedures (Signed)
Patient has his home CPAP bedside and ready for use. Patient stated he would place on when ready for bed and did not need any assistance.

## 2016-03-06 NOTE — Progress Notes (Addendum)
ANTICOAGULATION CONSULT NOTE - Follow-up Consult  Pharmacy Consult for Heparin and warfarin  Indication: Mechanical heart valve, AVR, PAF  Patient Measurements: Height: 5\' 7"  (170.2 cm) Weight: 230 lb 6.4 oz (104.509 kg) IBW/kg (Calculated) : 66.1 Heparin Dosing Weight: 89 kg  Vital Signs: Temp: 97.5 F (36.4 C) (05/20 0523) BP: 133/62 mmHg (05/20 0523) Pulse Rate: 60 (05/20 0523)  Labs:  Recent Labs  03/04/16 0423 03/04/16 1427 03/05/16 0715 03/05/16 2346 03/06/16 0643 03/06/16 0937  HGB 10.0*  --  9.4*  --  9.8*  --   HCT 32.2*  --  30.0*  --  31.0*  --   PLT 193  --  189  --  196  --   LABPROT 24.1*  --  21.7*  --  22.3*  --   INR 2.18*  --  1.90*  --  1.97*  --   HEPARINUNFRC  --   --   --  <0.10*  --  0.24*  CREATININE 0.65 2.35* 2.41*  --  2.38*  --     Assessment: 75 y/o with hx of PAF, mechanical AVR on warfarin pta. Admitted 5/16 with INR 4.61 and epistaxis and bright red blood in stool. Bleeding resolved, CBC stable and warfarin resumed 5/17, INR 1.9 and heparin bridge started 5/19 (will be continued until INR >/= 2.5 as goal 2.5-3.5 with mech AVR + afib).  INR today is 1.98 following warfarin resumption on 5/17. Remains on heparin gtt until INR therapeutic (2.5 - 3.5). HL this am is 0.24 (goal 0.3-0.7) on 1600 units/hr.  Goal of Therapy:  Heparin level goal 0.3-0.7; INR 2.5.3.5 Monitor platelets by anticoagulation protocol: Yes   Plan:  1. Increase heparin to 1750 units/hr 2. F/u heparin level in 8 hours 3. Warfarin 7.5 mg X 1 tonight 4. Daily HL, INR and CBC for now  Vincenza Hews, PharmD, BCPS 03/06/2016, 11:23 AM Pager: [phone]   Addendum: Heparin level remains below goal at 0.23 despite rate increase earlier today. Increase heparin further to 1950 units/hr and follow up AM labs.  Nena Jordan, PharmD, BCPS 03/06/2016, 7:42 PM

## 2016-03-07 DIAGNOSIS — K625 Hemorrhage of anus and rectum: Secondary | ICD-10-CM

## 2016-03-07 DIAGNOSIS — R040 Epistaxis: Secondary | ICD-10-CM

## 2016-03-07 LAB — CBC
HCT: 31.1 % — ABNORMAL LOW (ref 39.0–52.0)
Hemoglobin: 9.6 g/dL — ABNORMAL LOW (ref 13.0–17.0)
MCH: 28.1 pg (ref 26.0–34.0)
MCHC: 30.9 g/dL (ref 30.0–36.0)
MCV: 90.9 fL (ref 78.0–100.0)
PLATELETS: 227 10*3/uL (ref 150–400)
RBC: 3.42 MIL/uL — ABNORMAL LOW (ref 4.22–5.81)
RDW: 16.6 % — AB (ref 11.5–15.5)
WBC: 5.9 10*3/uL (ref 4.0–10.5)

## 2016-03-07 LAB — GLUCOSE, CAPILLARY
Glucose-Capillary: 109 mg/dL — ABNORMAL HIGH (ref 65–99)
Glucose-Capillary: 100 mg/dL — ABNORMAL HIGH (ref 65–99)

## 2016-03-07 LAB — PROTIME-INR
INR: 2.19 — AB (ref 0.00–1.49)
PROTHROMBIN TIME: 24.1 s — AB (ref 11.6–15.2)

## 2016-03-07 LAB — HEPARIN LEVEL (UNFRACTIONATED): Heparin Unfractionated: 0.36 IU/mL (ref 0.30–0.70)

## 2016-03-07 MED ORDER — WARFARIN SODIUM 5 MG PO TABS: 5.0000 mg | ORAL_TABLET | Freq: Every day | ORAL | Status: DC

## 2016-03-07 MED ORDER — WARFARIN SODIUM 7.5 MG PO TABS: 7.5000 mg | ORAL_TABLET | Freq: Once | ORAL | Status: DC

## 2016-03-07 NOTE — Discharge Summary (Signed)
Physician Discharge Summary  Robert Stanton [id number] DOB: 07/16/1941 DOA: 03/02/2016  PCP: Alesia Richards, MD  Admit date: 03/02/2016 Discharge date: 03/07/2016   Recommendations for Outpatient Follow-Up:   1. The patient will F/U with PCP on 03/09/16.  Needs F/U PT/INR and adjustment of coumadin if needed.   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    Discharge Condition: Improved.  Diet recommendation: Low sodium, heart healthy.  Carbohydrate-modified.   History of Present Illness:   Robert Stanton is an 75 y.o. male with A PMH significant for DM2, HTN, CKD stage 4, AAA, PAD, mechanical aortic valve replacement on chronic coumadin who was admitted 03/02/16 for treatment of epistaxis and bloody stools. His INR was elevated at 4.6.  Hospital Course by Problem:   Principal Problem:  Elevated INR with epistaxis and rectal bleeding on chronic anticoagulation secondary to mechanical aortic valve and atrial fibrillation The patient's INR is 2.19, on a heparin bridge. INR goal 2.5-3.5. Stools were Hemoccult negative and his epistaxis has resolved with no evidence of further ongoing problems with bleeding. Hemoglobin stable. OK to D/C home on higher dose of Coumadin and allow INR to gradually rise to therapeutic level given recent bleeding.  Was instructed to take 7.5 mg coumadin tonight and 5 mg thereafter until he follows up with his PCP 03/09/16 for a repeat INR check.  Active Problems:  Essential hypertension Continue verapamil.   Permanent atrial fibrillation (HCC) Continue amiodarone and verapamil. Anticoagulated.   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 Diet controlled at home.   Stage IV chronic kidney disease Creatinine currently consistent with usual baseline values. Spurious improvement of creatinine to 0.63 likely a lab error.   Hypothyroidism Continue Synthroid.   OSA Continue nocturnal CPAP.   Medical Consultants:    None.   Discharge Exam:   Filed Vitals:   03/07/16 0520 03/07/16 1245  BP: 141/64 133/60  Pulse: 53 58  Temp: 97 F (36.1 C) 97.5 F (36.4 C)  Resp:  18   Filed Vitals:   03/06/16 2125 03/06/16 2129 03/07/16 0520 03/07/16 1245  BP: 165/60 165/60 141/64 133/60  Pulse:  58 53 58  Temp:  97.7 F (36.5 C) 97 F (36.1 C) 97.5 F (36.4 C)  TempSrc:  Oral    Resp:  18  18  Height:      Weight:      SpO2:  99% 97% 94%    General exam: Appears calm and comfortable. Respiratory system: Clear to auscultation. Respiratory effort normal. Cardiovascular system: S1 & S2 heard, RRR. No JVD, rubs, gallops or clicks. II/VI SEM Gastrointestinal system: Abdomen is nondistended, soft and nontender. No organomegaly or masses felt. Normal bowel sounds heard. Central nervous system: Alert and oriented. No focal neurological deficits. Extremities: No clubbing, edema, or cyanosis. Skin: No rashes, lesions or ulcers Psychiatry: Judgement and insight appear normal. Mood & affect appropriate.   The results of significant diagnostics from this hospitalization (including imaging, microbiology, ancillary and laboratory) are listed below for reference.     Procedures and Diagnostic Studies:   No  results found.   Labs:   Basic Metabolic Panel:  Recent Labs Lab 03/02/16 0055 03/04/16 0423 03/04/16 1427 03/05/16 0715 03/06/16 0643  NA 137 137 139 136 137  K 3.9 4.2 4.4 3.9 3.9  CL 102 103 104 102 100*  CO2 27 24 25 26 24   GLUCOSE 108* 106* 151* 103* 111*  BUN 28* 27* 28* 28* 33*  CREATININE 2.94* 0.65 2.35* 2.41* 2.38*  CALCIUM  8.9 8.5* 8.9 8.6* 8.8*   GFR Estimated Creatinine Clearance: 31.9 mL/min (by C-G formula based on Cr of 2.38). Liver Function Tests:  Recent Labs Lab 03/04/16 0423 03/05/16 0715 03/06/16 0643  AST 23 26 24   ALT 19 23 22   ALKPHOS 49 50 53  BILITOT 1.3* 1.5* 1.0  PROT 7.2 6.8 6.7  ALBUMIN 3.1* 3.1* 3.1*   Coagulation profile  Recent Labs Lab 03/03/16 1346 03/04/16 0423 03/05/16 0715 03/06/16 0643 03/07/16 0553  INR 2.14* 2.18* 1.90* 1.97* 2.19*    CBC:  Recent Labs Lab 03/02/16 0055 03/03/16 0428 03/04/16 0423 03/05/16 0715 03/06/16 0643 03/07/16 0548  WBC 6.9 7.3 7.8 7.0 5.8 5.9  NEUTROABS 4.1  --   --   --   --   --   HGB 10.7* 10.1* 10.0* 9.4* 9.8* 9.6*  HCT 34.1* 32.2* 32.2* 30.0* 31.0* 31.1*  MCV 92.4 92.0 92.0 91.5 91.2 90.9  PLT 195 207 193 189 196 227   CBG:  Recent Labs Lab 03/06/16 1148 03/06/16 1638 03/06/16 2234 03/07/16 0738 03/07/16 1146  GLUCAP 111* 160* 117* 109* 100*    Discharge Instructions:   Discharge Instructions    Call MD for:  extreme fatigue    Complete by:  As directed      Call MD for:    Complete by:  As directed   Any bleeding.     Diet - low sodium heart healthy    Complete by:  As directed      Diet Carb Modified    Complete by:  As directed      Increase activity slowly    Complete by:  As directed             Medication List    TAKE these medications        albuterol (2.5 MG/3ML) 0.083% nebulizer solution  Commonly known as:  PROVENTIL  Take 2.5 mg by nebulization every 6 (six) hours as needed for wheezing or shortness of breath.     allopurinol 300 MG tablet  Commonly known as:  ZYLOPRIM  TAKE 1 TABLET BY MOUTH DAILY. TO PREVENT GOUT     amiodarone 200 MG tablet  Commonly known as:  PACERONE  TAKE 1 TABLET (200 MG TOTAL) BY MOUTH DAILY.     aspirin 81 MG tablet  Take 81 mg by mouth daily.     bumetanide 2 MG tablet  Commonly known as:  BUMEX  Take 1/2 tablet daily as needed for swelling       fenofibrate 145 MG tablet  Commonly known as:  TRICOR  TAKE 1 TABLET DAILY FOR BLOOD FATS     ferrous sulfate 325 (65 FE) MG tablet  Take 325 mg by mouth daily.     HYDROcodone-acetaminophen 5-325 MG tablet  Commonly known as:  NORCO  Take 1 tablet by mouth every 6 (six) hours as needed.     ipratropium-albuterol 0.5-2.5 (3) MG/3ML Soln  Commonly known as:  DUONEB  Inhale 3 mLs into the lungs every 6 (six) hours  as needed (shortness of breath).     isosorbide mononitrate 30 MG 24 hr tablet  Commonly known as:  IMDUR  TAKE 1 TABLET BY MOUTH EVERY DAY     levothyroxine 50 MCG tablet  Commonly known as:  SYNTHROID, LEVOTHROID  TAKE 1 TABLET BY MOUTH EVERY DAY     montelukast 10 MG tablet  Commonly known as:  SINGULAIR  TAKE 1 TABLET (10 MG TOTAL) BY MOUTH DAILY.     NITROSTAT 0.4 MG SL tablet  Generic drug:  nitroGLYCERIN  PLACE 1 TABLET (0.4 MG TOTAL) UNDER THE TONGUE EVERY 5 (FIVE) MINUTES AS NEEDED FOR CHEST PAIN.     pravastatin 40 MG tablet  Commonly known as:  PRAVACHOL  TAKE 1 TABLET (40 MG TOTAL) BY MOUTH EVERY MORNING.     ranitidine 300 MG tablet  Commonly known as:  ZANTAC  TAKE 1 TABLET (300 MG TOTAL) BY MOUTH AT BEDTIME.     sertraline 50 MG tablet  Commonly known as:  ZOLOFT  Take 1 tablet (50 mg total) by mouth daily.     tiotropium 18 MCG inhalation capsule  Commonly known as:  SPIRIVA  Place 18 mcg into inhaler and inhale daily as needed (for shortness of breath).     verapamil 80 MG tablet  Commonly known as:  CALAN  TAKE 1 TABLET (80 MG TOTAL) BY MOUTH 2 (TWO) TIMES DAILY.     VITAMIN B 12 PO  Take 1,500 mcg by mouth daily.     VITAMIN D PO  Take 5,000 Units by mouth daily.     warfarin 5 MG tablet  Commonly known as:  COUMADIN  Take 1-1.5 tablets (5-7.5 mg total) by mouth daily. Take 7.5 mg tonight then 5 mg daily thereafter until you follow up with your regular doctor who can adjust med further based on your blood test.            Follow-up Information    Follow up with Alesia Richards, MD On 03/09/2016.   Specialty:  Internal Medicine   Why:  At your scheduled appt time, repeat PT/INR and adjustment of coumadin.   Contact information:   [address] [place] [address] [phone]        Time coordinating discharge: 35 minutes.  Signed:  RAMA,CHRISTINA  Pager [phone] [facility] 03/07/2016, 1:15 PM

## 2016-03-07 NOTE — Progress Notes (Signed)
ANTICOAGULATION CONSULT NOTE - Follow-up Consult Pharmacy Consult for Heparin and warfarin  Indication: Mechanical heart valve, AVR, PAF  Patient Measurements: Height: 5\' 7"  (170.2 cm) Weight: 230 lb 6.4 oz (104.509 kg) IBW/kg (Calculated) : 66.1 Heparin Dosing Weight: 89 kg  Vital Signs: Temp: 97 F (36.1 C) (05/21 0520) BP: 141/64 mmHg (05/21 0520) Pulse Rate: 53 (05/21 0520)  Labs:  Recent Labs  03/04/16 1427  03/05/16 0715  03/06/16 [id number] 03/06/16 [id number] 03/06/16 1842 03/07/16 0548 03/07/16 0553  HGB  --   < > 9.4*  --  9.8*  --   --  9.6*  --   HCT  --   --  30.0*  --  31.0*  --   --  31.1*  --   PLT  --   --  189  --  196  --   --  227  --   LABPROT  --   --  21.7*  --  22.3*  --   --   --  24.1*  INR  --   --  1.90*  --  1.97*  --   --   --  2.19*  HEPARINUNFRC  --   --   --   < >  --  0.24* 0.23*  --  0.36  CREATININE 2.35*  --  2.41*  --  2.38*  --   --   --   --   < > = values in this interval not displayed.  Assessment: 75 y/o with hx of PAF, mechanical AVR on warfarin pta. Admitted 5/16 with INR 4.61 and epistaxis and bright red blood in stool. Bleeding resolved, CBC stable and warfarin resumed 5/17 with INR.  Heparin bridge started 5/19 (will be continued until INR >/= 2.5 as goal 2.5-3.5 with mech AVR + afib).  INR this morning is 2.19 (goal 2.5-3.5) up from 1.97 following 7.5 mg dose of coumadin last pm. Heparin level is therapeutic at 0.36 on heparin gtt at 1950 units/hr. CBC stable with no sxs of bleeding.   Goal of Therapy:  Heparin level goal 0.3-0.7; INR 2.5.3.5 Monitor platelets by anticoagulation protocol: Yes   Plan:  1. Continue heparin at 1950 units/hr 2. Confirmatory heparin level in 8 hours 3. Warfarin 7.5 mg X 1 tonight 4. Daily HL, INR and CBC for now  5. Stop heparin infusion when INR therapeutic   Vincenza Hews, PharmD, BCPS 03/07/2016, 10:06 AM Pager: [phone]

## 2016-03-09 ENCOUNTER — Ambulatory Visit (INDEPENDENT_AMBULATORY_CARE_PROVIDER_SITE_OTHER): Payer: PPO | Admitting: Internal Medicine

## 2016-03-09 ENCOUNTER — Encounter: Payer: Self-pay | Admitting: Internal Medicine

## 2016-03-09 VITALS — BP 138/66 | HR 70 | Temp 98.0°F | Resp 20 | Ht 68.0 in | Wt 230.0 lb

## 2016-03-09 DIAGNOSIS — R040 Epistaxis: Secondary | ICD-10-CM

## 2016-03-09 DIAGNOSIS — N184 Chronic kidney disease, stage 4 (severe): Secondary | ICD-10-CM | POA: Diagnosis not present

## 2016-03-09 DIAGNOSIS — I10 Essential (primary) hypertension: Secondary | ICD-10-CM

## 2016-03-09 DIAGNOSIS — I482 Chronic atrial fibrillation: Secondary | ICD-10-CM

## 2016-03-09 DIAGNOSIS — K625 Hemorrhage of anus and rectum: Secondary | ICD-10-CM

## 2016-03-09 DIAGNOSIS — E1122 Type 2 diabetes mellitus with diabetic chronic kidney disease: Secondary | ICD-10-CM

## 2016-03-09 DIAGNOSIS — Z79899 Other long term (current) drug therapy: Secondary | ICD-10-CM

## 2016-03-09 DIAGNOSIS — E559 Vitamin D deficiency, unspecified: Secondary | ICD-10-CM | POA: Diagnosis not present

## 2016-03-09 DIAGNOSIS — E039 Hypothyroidism, unspecified: Secondary | ICD-10-CM | POA: Diagnosis not present

## 2016-03-09 DIAGNOSIS — Z7901 Long term (current) use of anticoagulants: Secondary | ICD-10-CM | POA: Diagnosis not present

## 2016-03-09 DIAGNOSIS — E1129 Type 2 diabetes mellitus with other diabetic kidney complication: Secondary | ICD-10-CM | POA: Diagnosis not present

## 2016-03-09 DIAGNOSIS — E782 Mixed hyperlipidemia: Secondary | ICD-10-CM

## 2016-03-09 DIAGNOSIS — I4821 Permanent atrial fibrillation: Secondary | ICD-10-CM

## 2016-03-09 LAB — CBC WITH DIFFERENTIAL/PLATELET
BASOS ABS: 0 {cells}/uL (ref 0–200)
BASOS PCT: 0 %
EOS ABS: 208 {cells}/uL (ref 15–500)
Eosinophils Relative: 4 %
HCT: 34 % — ABNORMAL LOW (ref 38.5–50.0)
HEMOGLOBIN: 10.8 g/dL — AB (ref 13.2–17.1)
LYMPHS ABS: 1092 {cells}/uL (ref 850–3900)
Lymphocytes Relative: 21 %
MCH: 29 pg (ref 27.0–33.0)
MCHC: 31.8 g/dL — ABNORMAL LOW (ref 32.0–36.0)
MCV: 91.4 fL (ref 80.0–100.0)
MONO ABS: 520 {cells}/uL (ref 200–950)
MPV: 10.2 fL (ref 7.5–12.5)
Monocytes Relative: 10 %
NEUTROS ABS: 3380 {cells}/uL (ref 1500–7800)
Neutrophils Relative %: 65 %
Platelets: 284 10*3/uL (ref 140–400)
RBC: 3.72 MIL/uL — ABNORMAL LOW (ref 4.20–5.80)
RDW: 16.4 % — ABNORMAL HIGH (ref 11.0–15.0)
WBC: 5.2 10*3/uL (ref 3.8–10.8)

## 2016-03-09 LAB — BASIC METABOLIC PANEL WITH GFR
BUN: 31 mg/dL — AB (ref 7–25)
CHLORIDE: 101 mmol/L (ref 98–110)
CO2: 27 mmol/L (ref 20–31)
CREATININE: 2.27 mg/dL — AB (ref 0.70–1.18)
Calcium: 9.1 mg/dL (ref 8.6–10.3)
GFR, Est African American: 32 mL/min — ABNORMAL LOW (ref >=60)
GFR, Est Non African American: 28 mL/min — ABNORMAL LOW (ref >=60)
Glucose, Bld: 97 mg/dL (ref 65–99)
POTASSIUM: 4.8 mmol/L (ref 3.5–5.3)
Sodium: 138 mmol/L (ref 135–146)

## 2016-03-09 LAB — LIPID PANEL
CHOL/HDL RATIO: 3.7 ratio (ref <=5.0)
Cholesterol: 138 mg/dL (ref 125–200)
HDL: 37 mg/dL — AB (ref >=40)
LDL CALC: 75 mg/dL (ref <130)
Triglycerides: 128 mg/dL (ref <150)
VLDL: 26 mg/dL (ref <30)

## 2016-03-09 LAB — HEPATIC FUNCTION PANEL
ALBUMIN: 4 g/dL (ref 3.6–5.1)
ALK PHOS: 66 U/L (ref 40–115)
ALT: 17 U/L (ref 9–46)
AST: 22 U/L (ref 10–35)
BILIRUBIN DIRECT: 0.3 mg/dL — AB (ref <=0.2)
Indirect Bilirubin: 0.5 mg/dL (ref 0.2–1.2)
Total Bilirubin: 0.8 mg/dL (ref 0.2–1.2)
Total Protein: 7.3 g/dL (ref 6.1–8.1)

## 2016-03-09 LAB — PROTIME-INR
INR: 3.28 — ABNORMAL HIGH (ref <1.50)
Prothrombin Time: 33.9 seconds — ABNORMAL HIGH (ref 11.6–15.2)

## 2016-03-09 LAB — TSH: TSH: 5 m[IU]/L — AB (ref 0.40–4.50)

## 2016-03-09 LAB — HEMOGLOBIN A1C
HEMOGLOBIN A1C: 5.9 % — AB (ref <5.7)
Mean Plasma Glucose: 123 mg/dL

## 2016-03-09 LAB — MAGNESIUM: MAGNESIUM: 2.1 mg/dL (ref 1.5–2.5)

## 2016-03-09 NOTE — Progress Notes (Signed)
Patient ID: Robert Stanton, male   DOB: 04-04-1941, 75 y.o.   MRN: [id number]  [facility]IC[facility]                       Unk Pinto, M.D.        [name]. [name], P.A.-C       [name], P.A.-C   [facility]                [address] [place], [place] [id number] Telephone [phone] Telefax [phone] _________________________________________________________________________     This very nice 75 y.o. MWM presents for post hospital  follow up after admitted with Epistaxis and noted bright red rectal bleeding and elevated INR 4.6 when admitted on 03/02/2016. Patient was hospitalized thru 03/07/2016 and INR was 2.19 on day of discharge with coags to be monitored closely. Patient is also followed for Hypertension, ASCAD/s/pCABG & AoVR (St Jude), ChAfib, severe PVD,  HLD, T2_DM w/CKD 4  and Vitamin D Deficiency. Patient has COPD and tragically continues to smoke.      Patient is treated for HTN circa 1970's & BP has been controlled at home. Today's BP: 138/66 mmHg. Patient has had no complaints of any cardiac type chest pain, palpitations, dyspnea/orthopnea/PND, dizziness, claudication, or dependent edema.     Hyperlipidemia is controlled with diet & meds. Patient denies myalgias or other med SE's. Current  Lipids are at goal with Cholesterol 138; HDL 37*; LDL 75; Triglycerides 128.     Also, the patient has history of T2_NIDDM w/CKD 4 (GFR 29 ml/min) and has had no symptoms of reactive hypoglycemia, diabetic polys, paresthesias or visual blurring. Current A1c is near goal with A1c 5.9%.      Further, the patient also has history of Vitamin D Deficiency of "22" in 2009 and supplements vitamin D without any suspected side-effects. Last vitamin D was 60 on 11/21/2015.   Medication Sig  . albuterol  neb soln Take 2.5 mg by nebulization every 6 (six) hours as needed for wheezing or shortness of breath.  .  allopurinol  300 MG TAKE 1 TABLET BY MOUTH DAILY. TO PREVENT GOUT  . amiodarone  200 MG  TAKE 1 TABLET (200 MG TOTAL) BY MOUTH DAILY.  [name] aspirin 81 MG  Take 81 mg by mouth daily.  . bumetanide  2 MG  Take 1/2 tablet daily as needed for swelling  . VITAMIN D  Take 5,000 Units by mouth daily.   [name] VITAMIN B 12 tab Take 1,500 mcg by mouth daily.   . fenofibrate  145 MG TAKE 1 TABLET DAILY FOR BLOOD FATS  . ferrous sulfate 325  MG  Take 325 mg by mouth daily.  . NORCO 5-325 MG  Take 1 tablet by mouth every 6 (six) hours as needed.  . DUONEB SOLN Inhale 3 mLs into the lungs every 6 (six) hours as needed (shortness of breath).   [name] iIMDUR 30 MG  TAKE 1 TABLET BY MOUTH EVERY DAY  . levothyroxine 50 MCG TAKE 1 TABLET BY MOUTH EVERY DAY  . montelukast 10 MG  TAKE 1 TABLET (10 MG TOTAL) BY MOUTH DAILY.  [name] NITROSTAT 0.4 MG SL  PLACE 1 TABLET (0.4 MG TOTAL) UNDER THE TONGUE EVERY 5 (FIVE) MINUTES AS NEEDED FOR CHEST PAIN.  [name] pravastatin  40 MG TAKE 1 TABLET (40 MG  TOTAL) BY MOUTH EVERY MORNING.  . ranitidine  300 MG  TAKE 1 TABLET (300 MG TOTAL) BY MOUTH AT BEDTIME.  [name] sertraline  50 MG  Take 1 tablet (50 mg total) by mouth daily.  [name] SPIRIVA 18 MCG Place 18 mcg into inhaler and inhale daily as needed (for shortness of breath).   . verapamil  80 MG  TAKE 1 TABLET (80 MG TOTAL) BY MOUTH 2 (TWO) TIMES DAILY.  [name] warfarin 5 MG Take 1-1.5 tablets (5-7.5 mg total) by mouth daily. Take 7.5 mg tonight then 5 mg daily thereafter until you follow up with your regular doctor who can adjust med further based on your blood test.   Allergies  Allergen Reactions  . Chantix [Varenicline] Nausea And Vomiting  . Lyrica [Pregabalin]     Swelling, couldn't breathe  . Nsaids Other (See Comments)    Unknown   . Simvastatin     Pt said he had a "bleed out" after taking it  . Ziac [Bisoprolol-Hydrochlorothiazide]     fatigue   PMHx:   Past Medical History  Diagnosis Date  . Mechanical heart valve present     a. Hx  mechanical St Jude AVR 2000.  . Cataracts, bilateral   . OSA (obstructive sleep apnea)   . Permanent atrial fibrillation ([place])   . Ischemic cardiomyopathy   . History of echocardiogram 04/17/2013    EF 40-45%; LV hypertrophy; LV mild-mod dilated; AV regurg.; LA severely diated, RA mildly dilated  . History of Doppler ultrasound 04/05/2013    LEAs; small distal abd. aoritc aneuysm is stable; fem-pop graft to R leg has 50-69% blockage  . History of nuclear stress test 05/14/2011    Dipyridamole; moderate perfusion defect in Basal Infrioer & Mid Inferior region - consistent w/infarct or scar; global LV systolic function mildly reduced; EKG negative for ischemia; low risk scan  . COPD (chronic obstructive pulmonary disease) ([place])   . Hyperlipidemia   . Hypertension   . Kidney cysts   . CKD (chronic kidney disease), stage III   . Hiatal hernia   . GERD (gastroesophageal reflux disease)   . Prediabetes   . Gout   . AAA (abdominal aortic aneurysm) ([place])   . Vitamin B deficiency   . Peripheral arterial disease ([place])     a. s/p aorto bifem BG;  b. 11/2013 PVA distal R Fem-fem stenosis;  c. 12/2013 PTA of dist R femoral bypass graft.  [name] CAD (coronary artery disease)     a. CABG '00. b. all SVGs occluded 2010. c. low risk Nuc 2012   . Chronic anticoagulation   . Ventricular tachycardia (paroxysmal) ([place])     Seen by Dr. Lovena Le with EP, started on amiodarone   Immunization History  Administered Date(s) Administered  . DT 01/25/2012  . Influenza Split 06/28/2013  . Influenza, High Dose Seasonal PF 08/02/2014, 07/17/2015  . Pneumococcal Conjugate-13 03/25/2014  . Pneumococcal Polysaccharide-23 08/11/2012  . Zoster 10/30/2010   Past Surgical History  Procedure Laterality Date  . Stents in kidneys    . Coronary artery bypass graft  2000    3 vessel; LIMA to LAD; RCA, OM  . Shunts in femoral arteries    . Cardioversion  8/282012  . Aortic valve replacement      St. Jude  . Femoral bypass     . Cardiac catheterization  11/25/2008    loss of 2/3 (RCA, OM) bypass grafts with patent internal mammary artery to LAD; large collateral filling  of RCA ; osteal narrowing of circumflex of ~50%  . Lower extremity angiogram N/A 12/03/2013    Procedure: LOWER EXTREMITY ANGIOGRAM;  Surgeon: Lorretta Harp, MD;  Location: [facility] CATH LAB;  Service: Cardiovascular;  Laterality: N/A;  . Lower extremity angiogram N/A 01/07/2014    Procedure: LOWER EXTREMITY ANGIOGRAM;  Surgeon: Lorretta Harp, MD;  Location[facility] CATH LAB;  Service: Cardiovascular;  Laterality: N/A;   FHx:    Reviewed / unchanged  SHx:    Reviewed / unchanged  Systems Review:  Constitutional: Denies fever, chills, wt changes, headaches, insomnia, fatigue, night sweats, change in appetite. Eyes: Denies redness, blurred vision, diplopia, discharge, itchy, watery eyes.  ENT: Denies discharge, congestion, post nasal drip, epistaxis, sore throat, earache, hearing loss, dental pain, tinnitus, vertigo, sinus pain, snoring.  CV: Denies chest pain, palpitations, irregular heartbeat, syncope, dyspnea, diaphoresis, orthopnea, PND, claudication or edema. Respiratory: denies cough, dyspnea, DOE, pleurisy, hoarseness, laryngitis, wheezing.  Gastrointestinal: Denies dysphagia, odynophagia, heartburn, reflux, water brash, abdominal pain or cramps, nausea, vomiting, bloating, diarrhea, constipation, hematemesis, melena, hematochezia  or hemorrhoids. Genitourinary: Denies dysuria, frequency, urgency, nocturia, hesitancy, discharge, hematuria or flank pain. Musculoskeletal: Denies arthralgias, myalgias, stiffness, jt. swelling, pain, limping or strain/sprain.  Skin: Denies pruritus, rash, hives, warts, acne, eczema or change in skin lesion(s). Neuro: No weakness, tremor, incoordination, spasms, paresthesia or pain. Psychiatric: Denies confusion, memory loss or sensory loss. Endo: Denies change in weight, skin or hair change.  Heme/Lymph: Has bruising of  the dorsal forearms.  Physical Exam  BP 138/66   Pulse 70  Temp  98 F   Resp 20  Ht 5\' 8"    Wt 230 lb     BMI 34.98   Appears over nourished, disheveled and in no distress.  Eyes: PERRLA, EOMs, conjunctiva no swelling or erythema. Sinuses: No frontal/maxillary tenderness ENT/Mouth: EAC's clear, TM's nl w/o erythema, bulging. Nares clear w/o erythema, swelling, exudates. Oropharynx clear without erythema or exudates. Oral hygiene is good. Tongue normal, non obstructing. Hearing intact.  Neck: Supple. Thyroid nl. Car 2+/2+ without bruits, nodes or JVD. Chest: Respirations nl with BS clear & equal w/o rales, rhonchi, wheezing or stridor.  Cor: Heart sounds prosthetic systolic click w/ sl irregular rate and rhythm with Gr 2 sys [place] and no gallops, clicks, or rubs. Peripheral pulses 2+ in UEs & 1+ in LEs with 1+ ankle/pedal  edema.  Abdomen: Soft, rotund & bowel sounds normal. Non-tender w/o guarding, rebound, hernias, masses, or organomegaly.  Lymphatics: Unremarkable.  Musculoskeletal: Full ROM all peripheral extremities, joint stability, 5/5 strength, and normal gait.  Skin: Warm, dry without exposed rashes, lesions or ecchymosis apparent.  Neuro: Cranial nerves intact, reflexes equal bilaterally. Sensory-motor testing grossly intact. Tendon reflexes grossly intact.  Pysch: Alert & oriented x 3.  Insight and judgement nl & appropriate. No ideations.  Assessment and Plan:  1. Essential hypertension  - TSH  2. Hyperlipidemia  - Lipid panel - TSH  3. Type 2 diabetes mellitus with stage 4 chronic kidney disease, without long-term current use of insulin (HCC)  - Hemoglobin A1c - Insulin, random  4. Vitamin D deficiency  - VITAMIN D 25 Hydroxy )  5. Hypothyroidism  - TSH  6. Permanent atrial fibrillation (HCC)   7. Epistaxis   8. Rectal bleeding   9. Long term current use of anticoagulant therapy  - Protime-INR  10. Medication management  - CBC with  Differential/Platelet - BASIC METABOLIC PANEL WITH GFR - Hepatic function panel - Magnesium  Recommended regular exercise, BP monitoring, weight control, and discussed med and SE's. Recommended labs to assess and monitor clinical status. Further disposition pending results of labs. Over 30 minutes of exam, counseling, chart review was performed

## 2016-03-09 NOTE — Patient Instructions (Signed)
Warfarin Coagulopathy °Warfarin (Coumadin®) coagulopathy refers to bleeding that may occur as a complication of the medicine warfarin. Warfarin is an oral blood thinner (anticoagulant). Warfarin is used for medical conditions where thinning of the blood is needed to prevent blood clots.  °CAUSES °Bleeding is the most common and most serious complication of warfarin. The amount of bleeding is related to the warfarin dose and length of treatment. In addition, bleeding complications can also occur due to: °· Intentional or accidental warfarin overdose. °· Underlying medical conditions. °· Dietary changes. °· Medicine, herbal, supplement, or alcohol interactions. °SYMPTOMS °Severe bleeding while on warfarin may occur from any tissue or organ. Symptoms of the blood being too thin may include: °· Bleeding from the nose or gums. °· Blood in bowel movements which may appear as bright red, dark, or black tarry stools. °· Blood in the urine which may appear as pink, red, or brown urine. °· Unusual bruising or bruising easily. °· A cut that does not stop bleeding within 10 minutes. °· Vomiting blood or continuous nausea for more than 1 day. °· Coughing up blood. °· Broken blood vessels in your eye (subconjunctival hemorrhage). °· Abdominal or back pain with or without flank bruising. °· Sudden, severe headache. °· Sudden weakness or numbness of the face, arm, or leg, especially on one side of the body. °· Sudden confusion. °· Trouble speaking (aphasia) or understanding. °· Sudden trouble seeing in one or both eyes. °· Sudden trouble walking. °· Dizziness. °· Loss of balance or coordination. °· Vaginal bleeding. °· Swelling or pain at an injection site. °· Superficial fat tissue death (necrosis) which may cause skin scarring. This is more common in women and may first present as pain in the waist, thighs, or buttocks. °HOME CARE INSTRUCTIONS °· Always contact your health care provider of any concerns or signs of possible  warfarin coagulopathy as soon as possible. °· Take warfarin exactly as directed by your health care provider. It is recommended that you take your warfarin dose at the same time of the day. If you have been told to stop taking warfarin, do not resume taking warfarin until directed to do so by your health care provider. Follow your health care provider's instructions if you accidentally take an extra dose or miss a dose of warfarin. It is very important to take warfarin as directed since bleeding or blood clots could result in chronic or permanent injury, pain, or disability. °· Keep all follow-up appointments with your health care provider as directed. It is very important to keep your appointments. Not keeping appointments could result in a chronic or permanent injury, pain, or disability because warfarin is a medicine that requires close monitoring. °· While taking warfarin, you will need to have regular blood tests to measure your blood clotting time. These blood tests usually include both the prothrombin time (PT) and International Normalized Ratio (INR) tests. The PT and INR results allow your health care provider to adjust your dose of warfarin. The dose can change for many reasons. It is critically important that you have your PT and INR levels drawn exactly as directed. Your warfarin dose may stay the same or change depending on what the PT and INR results are. Be sure to follow up with your health care provider regarding your PT and INR test results and what your warfarin dosage should be. °· Many medicines can interfere with warfarin and affect the PT and INR results. You must tell your health care provider about any and   all medicines you take. This includes all vitamins and supplements. Ask your health care provider before taking these. Prescription and over-the-counter medicine consistency is critical to warfarin management. It is important that potential interactions are checked before you start a new  medicine. Be especially cautious with aspirin and anti-inflammatory medicines. Ask your health care provider before taking these. Medicines such as antibiotics and acid-reducing medicine can interact with warfarin and can cause an increased warfarin effect. Warfarin can also interfere with the effectiveness of medicines you are taking. Do not take or discontinue any prescribed or over-the-counter medicine except on the advice of your health care provider or pharmacist. °· Some vitamins, supplements, and herbal products interfere with the effectiveness of warfarin. Vitamin E may increase the anticoagulant effects of warfarin. Vitamin K can cause warfarin to be less effective. Do not take or discontinue any vitamin, supplement, or herbal product except on the advice of your health care provider or pharmacist. °· Eat what you normally eat and keep the vitamin K content of your diet consistent. Avoid major changes in your diet, or notify your health care provider before changing your diet. Suddenly getting a lot more vitamin K could cause your blood to clot too quickly. A sudden decrease in vitamin K intake could cause your blood to clot too slowly. These changes in vitamin K intake could lead to dangerous blood clots or to bleeding. To keep your vitamin K intake consistent, you must be aware of which foods contain moderate or high amounts of vitamin K. Some foods that are high in vitamin K include spinach, kale, broccoli, cabbage, greens, Brussels sprouts, asparagus, bok choy, coleslaw, and parsley. If you drink green tea, drink the same amount each day. Arrange a visit with a dietitian to answer your questions. °· If you have a loss of appetite or get the stomach flu (viral gastroenteritis), talk to your health care provider as soon as possible. A decrease in your normal vitamin K intake can make you more sensitive to your usual dose of warfarin. °· Some medical conditions may increase your risk for bleeding while you  are taking warfarin. A fever, diarrhea lasting more than a day, worsening heart failure, or worsening liver function are some medical conditions that could affect warfarin. Contact your health care provider if you have any of these medical conditions. °· Be careful not to cut yourself when using sharp objects or while shaving. °· Alcohol can change the body's ability to handle warfarin. It is best to avoid alcoholic drinks or consume only very small amounts while taking warfarin. Notify your health care provider if you change your alcohol intake. A sudden increase in alcohol use can increase your risk of bleeding. Chronic alcohol use can cause warfarin to be less effective. °· Limit physical activities or sports that could result in a fall or cause injury. °· Do not use warfarin if you are pregnant. °· Inform all your health care providers and your dentist that you take warfarin. °· Inform all health care providers if you are taking warfarin and aspirin or platelet inhibitor medicines such as clopidogrel, ticagrelor, or prasugrel. Use of these medicines in addition to warfarin can increase your risk of bleeding or death. Taking these medicines together should only be done under the direct care of your health care providers. °SEEK IMMEDIATE MEDICAL CARE IF: °· You cough up blood. °· You have dark or black stools or there is bright red blood coming from your rectum. °· You vomit blood or have   nausea for more than 1 day.  You have blood in the urine or pink-colored urine.  You have unusual bruising or have increased bruising.  You have bleeding from the nose or gums that does not stop quickly.  You have a cut that does not stop bleeding within 2-3 minutes.  You have sudden weakness or numbness of the face, arm, or leg, especially on one side of the body.  You have sudden confusion.  You have trouble speaking (aphasia) or understanding.  You have sudden trouble seeing in one or both eyes.  You have  sudden trouble walking.  You have dizziness.  You have a loss of balance or coordination.  You have a sudden, severe headache.  You have a serious fall or head injury, even if you are not bleeding.  You have swelling or pain at an injection site.  You have unexplained tenderness or pain in the abdomen, back, waist, thighs, or buttocks. Any of these symptoms may represent a serious problem that is an emergency. Do not wait to see if the symptoms will go away. Get medical help right away. Call your local emergency services (911 in [place] Do not drive yourself to the hospital.  ++++++++++++++++++++++++++++++++++++++++++++++++++++++++++++++++++++++  Recommend Adult Low Dose Aspirin or   coated  Aspirin 81 mg daily   To reduce risk of Colon Cancer 20 %,   Skin Cancer 26 % ,   Melanoma 46%   and   Pancreatic cancer 60%   ++++++++++++++++++++++++++++++++++++++++++++++++++++++ Vitamin D goal   is between 70-100.   Please make sure that you are taking your Vitamin D as directed.   It is very important as a natural anti-inflammatory   helping hair, skin, and nails, as well as reducing stroke and heart attack risk.   It helps your bones and helps with mood.  It also decreases numerous cancer risks so please take it as directed.   Low Vit D is associated with a 200-300% higher risk for CANCER   and 200-300% higher risk for HEART   ATTACK  &  STROKE.   ......................................  It is also associated with higher death rate at younger ages,   autoimmune diseases like Rheumatoid arthritis, Lupus, Multiple Sclerosis.     Also many other serious conditions, like depression, Alzheimer's  Dementia, infertility, muscle aches, fatigue, fibromyalgia - just to name a few.  ++++++++++++++++++++++++++++++++++++++++++++++++  Recommend the book "The END of DIETING" by Dr Joel Fuhrman   & the book "The END of DIABETES " by Dr Joel Fuhrman  At [employer] - get book &  Audio CD's     Being diabetic has a  300% increased risk for heart attack, stroke, cancer, and alzheimer- type vascular dementia. It is very important that you work harder with diet by avoiding all foods that are white. Avoid white rice (brown & wild rice is OK), white potatoes (sweetpotatoes in moderation is OK), White bread or wheat bread or anything made out of white flour like bagels, donuts, rolls, buns, biscuits, cakes, pastries, cookies, pizza crust, and pasta (made from white flour & egg whites) - vegetarian pasta or spinach or wheat pasta is OK. Multigrain breads like Arnold's or [employer], or multigrain sandwich thins or flatbreads.  Diet, exercise and weight loss can reverse and cure diabetes in the early stages.  Diet, exercise and weight loss is very important in the control and prevention of complications of diabetes which affects every system in your body, ie. Brain - dementia/stroke, eyes -   glaucoma/blindness, heart - heart attack/heart failure, kidneys - dialysis, stomach - gastric paralysis, intestines - malabsorption, nerves - severe painful neuritis, circulation - gangrene & loss of a leg(s), and finally cancer and Alzheimers.    I recommend avoid fried & greasy foods,  sweets/candy, white rice (brown or wild rice or Quinoa is OK), white potatoes (sweet potatoes are OK) - anything made from white flour - bagels, doughnuts, rolls, buns, biscuits,white and wheat breads, pizza crust and traditional pasta made of white flour & egg white(vegetarian pasta or spinach or wheat pasta is OK).  Multi-grain bread is OK - like multi-grain flat bread or sandwich thins. Avoid alcohol in excess. Exercise is also important.    Eat all the vegetables you want - avoid meat, especially red meat and dairy - especially cheese.  Cheese is the most concentrated form of trans-fats which is the worst thing to clog up our arteries. Veggie cheese is OK which can be found in the fresh produce section at  [employer] or [employer] or [employer]  ++++++++++++++++++++++++++++++++++++++++++++++++++ DASH Eating Plan  DASH stands for "Dietary Approaches to Stop Hypertension."   The DASH eating plan is a healthy eating plan that has been shown to reduce high blood pressure (hypertension). Additional health benefits may include reducing the risk of type 2 diabetes mellitus, heart disease, and stroke. The DASH eating plan may also help with weight loss.  WHAT DO I NEED TO KNOW ABOUT THE DASH EATING PLAN?  For the DASH eating plan, you will follow these general guidelines:  Choose foods with a percent daily value for sodium of less than 5% (as listed on the food label).  Use salt-free seasonings or herbs instead of table salt or sea salt.  Check with your health care provider or pharmacist before using salt substitutes.  Eat lower-sodium products, often labeled as "lower sodium" or "no salt added."  Eat fresh foods.  Eat more vegetables, fruits, and low-fat dairy products.    Choose whole grains. Look for the word "whole" as the first word in the ingredient list.  Choose fish   Limit sweets, desserts, sugars, and sugary drinks.  Choose heart-healthy fats.  Eat veggie cheese   Eat more home-cooked food and less restaurant, buffet, and fast food.  Limit fried foods.  Cook foods using methods other than frying.  Limit canned vegetables. If you do use them, rinse them well to decrease the sodium.  When eating at a restaurant, ask that your food be prepared with less salt, or no salt if possible.                      WHAT FOODS CAN I EAT?  Read Dr Joel Fuhrman's books on The End of Dieting & The End of Diabetes  Grains  Whole grain or whole wheat bread. Brown rice. Whole grain or whole wheat pasta. Quinoa, bulgur, and whole grain cereals. Low-sodium cereals. Corn or whole wheat flour tortillas. Whole grain cornbread. Whole grain crackers. Low-sodium  crackers.  Vegetables  Fresh or frozen vegetables (raw, steamed, roasted, or grilled). Low-sodium or reduced-sodium tomato and vegetable juices. Low-sodium or reduced-sodium tomato sauce and paste. Low-sodium or reduced-sodium canned vegetables.   Fruits  All fresh, canned (in natural juice), or frozen fruits.  Protein Products   All fish and seafood.  Dried beans, peas, or lentils. Unsalted nuts and seeds. Unsalted canned beans.  Dairy  Low-fat dairy products, such as skim or 1% milk, 2% or reduced-fat cheeses,   low-fat ricotta or cottage cheese, or plain low-fat yogurt. Low-sodium or reduced-sodium cheeses.  Fats and Oils  Tub margarines without trans fats. Light or reduced-fat mayonnaise and salad dressings (reduced sodium). Avocado. Safflower, olive, or canola oils. Natural peanut or almond butter.  Other  Unsalted popcorn and pretzels. The items listed above may not be a complete list of recommended foods or beverages. Contact your dietitian for more options.  +++++++++++++++++++++++++++++++++++++++++++  WHAT FOODS ARE NOT RECOMMENDED?  Grains/ White flour or wheat flour  White bread. White pasta. White rice. Refined cornbread. Bagels and croissants. Crackers that contain trans fat.  Vegetables  Creamed or fried vegetables. Vegetables in a . Regular canned vegetables. Regular canned tomato sauce and paste. Regular tomato and vegetable juices.  Fruits  Dried fruits. Canned fruit in light or heavy syrup. Fruit juice.  Meat and Other Protein Products  Meat in general - RED mwaet & White meat.  Fatty cuts of meat. Ribs, chicken wings, bacon, sausage, bologna, salami, chitterlings, fatback, hot dogs, bratwurst, and packaged luncheon meats.  Dairy  Whole or 2% milk, cream, half-and-half, and cream cheese. Whole-fat or sweetened yogurt. Full-fat cheeses or blue cheese. Nondairy creamers and whipped toppings. Processed cheese, cheese spreads, or cheese  curds.  Condiments  Onion and garlic salt, seasoned salt, table salt, and sea salt. Canned and packaged gravies. Worcestershire sauce. Tartar sauce. Barbecue sauce. Teriyaki sauce. Soy sauce, including reduced sodium. Steak sauce. Fish sauce. Oyster sauce. Cocktail sauce. Horseradish. Ketchup and mustard. Meat flavorings and tenderizers. Bouillon cubes. Hot sauce. Tabasco sauce. Marinades. Taco seasonings. Relishes.  Fats and Oils Butter, stick margarine, lard, shortening and bacon fat. Coconut, palm kernel, or palm oils. Regular salad dressings.  Pickles and olives. Salted popcorn and pretzels.  The items listed above may not be a complete list of foods and beverages to avoid.    

## 2016-03-10 LAB — VITAMIN D 25 HYDROXY (VIT D DEFICIENCY, FRACTURES): VIT D 25 HYDROXY: 44 ng/mL (ref 30–100)

## 2016-03-10 LAB — INSULIN, RANDOM: Insulin: 5 u[IU]/mL (ref 2.0–19.6)

## 2016-03-22 DIAGNOSIS — G4733 Obstructive sleep apnea (adult) (pediatric): Secondary | ICD-10-CM | POA: Diagnosis not present

## 2016-04-06 ENCOUNTER — Encounter: Payer: Self-pay | Admitting: Podiatry

## 2016-04-06 ENCOUNTER — Ambulatory Visit (INDEPENDENT_AMBULATORY_CARE_PROVIDER_SITE_OTHER): Payer: PPO | Admitting: Podiatry

## 2016-04-06 DIAGNOSIS — B351 Tinea unguium: Secondary | ICD-10-CM

## 2016-04-06 DIAGNOSIS — M79676 Pain in unspecified toe(s): Secondary | ICD-10-CM

## 2016-04-06 NOTE — Progress Notes (Signed)
Patient ID: Robert Stanton, male   DOB: 03-01-1941, 75 y.o.   MRN: [id number] Complaint:  Visit Type: Patient returns to my office for continued preventative foot care services. Complaint: Patient states" my nails have grown long and thick and become painful to walk and wear shoes" Patient has been diagnosed with DM with no foot complications. The patient presents for preventative foot care services. No changes to ROS.  Patient says he has no pain between his 4/5 toes right foot.  Podiatric Exam: Vascular: dorsalis pedis and posterior tibial pulses are  Barely palpable bilateral. Capillary return is immediate. Temperature gradient is WNL. Skin turgor WNL  Sensorium: Normal Semmes Weinstein monofilament test. Normal tactile sensation bilaterally. Nail Exam: Pt has thick disfigured discolored nails with subungual debris noted bilateral entire nail hallux through fifth toenails Ulcer Exam: There is no evidence of ulcer or pre-ulcerative changes or infection. Orthopedic Exam: Muscle tone and strength are WNL. No limitations in general ROM. No crepitus or effusions noted. Foot type and digits show no abnormalities. Bunion right foot Skin: No Porokeratosis. No infection or ulcers  Diagnosis:  Onychomycosis, , Pain in right toe, pain in left toes  Treatment & Plan Procedures and Treatment: Consent by patient was obtained for treatment procedures. The patient understood the discussion of treatment and procedures well. All questions were answered thoroughly reviewed. Debridement of mycotic and hypertrophic toenails, 1 through 5 bilateral and clearing of subungual debris. No ulceration, no infection noted. Self treated ingrown toenail left hallux. Return Visit-Office Procedure: Patient instructed to return to the office for a follow up visit 3 months for continued evaluation and treatment.    Gardiner Barefoot DPM

## 2016-04-08 ENCOUNTER — Encounter: Payer: Self-pay | Admitting: Internal Medicine

## 2016-04-08 ENCOUNTER — Ambulatory Visit (INDEPENDENT_AMBULATORY_CARE_PROVIDER_SITE_OTHER): Payer: PPO | Admitting: Internal Medicine

## 2016-04-08 VITALS — BP 112/60 | HR 58 | Temp 98.2°F | Resp 18 | Ht 68.0 in | Wt 233.0 lb

## 2016-04-08 DIAGNOSIS — I482 Chronic atrial fibrillation: Secondary | ICD-10-CM | POA: Diagnosis not present

## 2016-04-08 DIAGNOSIS — Z7901 Long term (current) use of anticoagulants: Secondary | ICD-10-CM

## 2016-04-08 DIAGNOSIS — I4821 Permanent atrial fibrillation: Secondary | ICD-10-CM

## 2016-04-08 NOTE — Progress Notes (Signed)
Assessment and Plan:   1. Long term current use of anticoagulant therapy -cont coumadin -dose adjust if necessary  - Protime-INR  2. Permanent atrial fibrillation (HCC) -cont coumadin  3.  Unstable Angina -will send a message to cardiology -may need increase in Imdur and further evaluation.        HPI 75 y.o.male presents for 1 month follow up of long term coumadin use for chronic atrial fibrillation. Patient reports that they have been doing well.  He is not having severe, bruising, bleeding, epistaxis, melena, or hematochezia.  He does take his coumadin daily.  He is having some increased chest pains after working outside and has been taking nitroglycerin at least twice weekly.  He is due to see Cards in the next two months.  He has not had to take more than 1 nitroglycerin  male is taking their medication.  They are not having difficulty with their medications.  They report no adverse reactions.    Past Medical History  Diagnosis Date  . Mechanical heart valve present     a. Hx mechanical St Jude AVR 2000.  . Cataracts, bilateral   . OSA (obstructive sleep apnea)   . Permanent atrial fibrillation ([place])   . Ischemic cardiomyopathy   . History of echocardiogram 04/17/2013    EF 40-45%; LV hypertrophy; LV mild-mod dilated; AV regurg.; LA severely diated, RA mildly dilated  . History of Doppler ultrasound 04/05/2013    LEAs; small distal abd. aoritc aneuysm is stable; fem-pop graft to R leg has 50-69% blockage  . History of nuclear stress test 05/14/2011    Dipyridamole; moderate perfusion defect in Basal Infrioer & Mid Inferior region - consistent w/infarct or scar; global LV systolic function mildly reduced; EKG negative for ischemia; low risk scan  . COPD (chronic obstructive pulmonary disease) ([place])   . Hyperlipidemia   . Hypertension   . Kidney cysts   . CKD (chronic kidney disease), stage III   . Hiatal hernia   . GERD (gastroesophageal reflux disease)   . Prediabetes   .  Gout   . AAA (abdominal aortic aneurysm) ([place])   . Vitamin B deficiency   . Peripheral arterial disease ([place])     a. s/p aorto bifem BG;  b. 11/2013 PVA distal R Fem-fem stenosis;  c. 12/2013 PTA of dist R femoral bypass graft.  [name] CAD (coronary artery disease)     a. CABG '00. b. all SVGs occluded 2010. c. low risk Nuc 2012   . Chronic anticoagulation   . Ventricular tachycardia (paroxysmal) ([place])     Seen by Dr. Lovena Le with EP, started on amiodarone     Allergies  Allergen Reactions  . Chantix [Varenicline] Nausea And Vomiting  . Lyrica [Pregabalin]     Swelling, couldn't breathe  . Nsaids Other (See Comments)    Unknown   . Simvastatin     Pt said he had a "bleed out" after taking it  . Ziac [Bisoprolol-Hydrochlorothiazide]     fatigue      Current Outpatient Prescriptions on File Prior to Visit  Medication Sig Dispense Refill  . albuterol (PROVENTIL) (2.5 MG/3ML) 0.083% nebulizer solution Take 2.5 mg by nebulization every 6 (six) hours as needed for wheezing or shortness of breath.    . allopurinol (ZYLOPRIM) 300 MG tablet TAKE 1 TABLET BY MOUTH DAILY. TO PREVENT GOUT 90 tablet 1  . amiodarone (PACERONE) 200 MG tablet TAKE 1 TABLET (200 MG TOTAL) BY MOUTH DAILY. 30 tablet 6  .  aspirin 81 MG tablet Take 81 mg by mouth daily.    . bumetanide (BUMEX) 2 MG tablet Take 1/2 tablet daily as needed for swelling 30 tablet 6  . Cholecalciferol (VITAMIN D PO) Take 5,000 Units by mouth daily.     . Cyanocobalamin (VITAMIN B 12 PO) Take 1,500 mcg by mouth daily.     . fenofibrate (TRICOR) 145 MG tablet TAKE 1 TABLET DAILY FOR BLOOD FATS 90 tablet 3  . ferrous sulfate 325 (65 FE) MG tablet Take 325 mg by mouth daily.    [name] HYDROcodone-acetaminophen (NORCO) 5-325 MG tablet Take 1 tablet by mouth every 6 (six) hours as needed. 40 tablet 0  . ipratropium-albuterol (DUONEB) 0.5-2.5 (3) MG/3ML SOLN Inhale 3 mLs into the lungs every 6 (six) hours as needed (shortness of breath).     . isosorbide  mononitrate (IMDUR) 30 MG 24 hr tablet TAKE 1 TABLET BY MOUTH EVERY DAY 90 tablet 1  . levothyroxine (SYNTHROID, LEVOTHROID) 50 MCG tablet TAKE 1 TABLET BY MOUTH EVERY DAY 90 tablet 1  . montelukast (SINGULAIR) 10 MG tablet TAKE 1 TABLET (10 MG TOTAL) BY MOUTH DAILY. 30 tablet 2  . NITROSTAT 0.4 MG SL tablet PLACE 1 TABLET (0.4 MG TOTAL) UNDER THE TONGUE EVERY 5 (FIVE) MINUTES AS NEEDED FOR CHEST PAIN. 25 tablet 5  . pravastatin (PRAVACHOL) 40 MG tablet TAKE 1 TABLET (40 MG TOTAL) BY MOUTH EVERY MORNING. 90 tablet 1  . ranitidine (ZANTAC) 300 MG tablet TAKE 1 TABLET (300 MG TOTAL) BY MOUTH AT BEDTIME. 90 tablet 1  . sertraline (ZOLOFT) 50 MG tablet Take 1 tablet (50 mg total) by mouth daily. 30 tablet 2  . tiotropium (SPIRIVA) 18 MCG inhalation capsule Place 18 mcg into inhaler and inhale daily as needed (for shortness of breath).     . verapamil (CALAN) 80 MG tablet TAKE 1 TABLET (80 MG TOTAL) BY MOUTH 2 (TWO) TIMES DAILY. 180 tablet 1  . warfarin (COUMADIN) 5 MG tablet Take 1-1.5 tablets (5-7.5 mg total) by mouth daily. Take 7.5 mg tonight then 5 mg daily thereafter until you follow up with your regular doctor who can adjust med further based on your blood test. 90 tablet 3   No current facility-administered medications on file prior to visit.    ROS: all negative except above.   Physical Exam: Filed Weights   04/08/16 1123  Weight: 233 lb (105.688 kg)   BP 112/60 mmHg  Pulse 58  Temp(Src) 98.2 F (36.8 C) (Temporal)  Resp 18  Ht 5\' 8"  (1.727 m)  Wt 233 lb (105.688 kg)  BMI 35.44 kg/m2 General Appearance: Well developed well nourished, non-toxic appearing in no apparent distress. Eyes: PERRLA, EOMs, conjunctiva w/ no swelling or erythema or discharge Sinuses: No Frontal/maxillary tenderness ENT/Mouth: Ear canals clear without swelling or erythema.  TM's normal bilaterally with no retractions, bulging, or loss of landmarks.   Neck: Supple, thyroid normal, no notable JVD   Respiratory: Respiratory effort mildly increased, Clear but distant breath sounds anteriorly and posteriorly bilaterally without rales, rhonchi, or stridor.  There is the occasional end expiratory wheeze. No retractions or accessory muscle usage. Cardio: Ireg ireg with no MRGs.   Abdomen: Soft, + BS.  Non tender, no guarding, rebound, hernias, masses.  Musculoskeletal: Full ROM, 5/5 strength, normal gait.  Skin: Warm, dry without rashes  Neuro: Awake and oriented X 3, Cranial nerves intact. Normal muscle tone, no cerebellar symptoms. Sensation intact.  Psych: normal affect, Insight and Judgment appropriate.  [name], PA-C 11:44 AM [facility]

## 2016-04-09 ENCOUNTER — Other Ambulatory Visit: Payer: Self-pay | Admitting: Physician Assistant

## 2016-04-09 LAB — PROTIME-INR
INR: 2.1 — ABNORMAL HIGH
PROTHROMBIN TIME: 21.8 s — AB (ref 9.0–11.5)

## 2016-04-13 ENCOUNTER — Encounter: Payer: Self-pay | Admitting: Internal Medicine

## 2016-04-13 ENCOUNTER — Ambulatory Visit (INDEPENDENT_AMBULATORY_CARE_PROVIDER_SITE_OTHER): Payer: PPO | Admitting: Internal Medicine

## 2016-04-13 VITALS — BP 150/60 | HR 59 | Ht 68.0 in | Wt 230.2 lb

## 2016-04-13 DIAGNOSIS — R079 Chest pain, unspecified: Secondary | ICD-10-CM

## 2016-04-13 DIAGNOSIS — E669 Obesity, unspecified: Secondary | ICD-10-CM | POA: Insufficient documentation

## 2016-04-13 DIAGNOSIS — Z952 Presence of prosthetic heart valve: Secondary | ICD-10-CM

## 2016-04-13 DIAGNOSIS — M79603 Pain in arm, unspecified: Secondary | ICD-10-CM

## 2016-04-13 DIAGNOSIS — I10 Essential (primary) hypertension: Secondary | ICD-10-CM

## 2016-04-13 DIAGNOSIS — I4729 Other ventricular tachycardia: Secondary | ICD-10-CM

## 2016-04-13 DIAGNOSIS — I739 Peripheral vascular disease, unspecified: Secondary | ICD-10-CM

## 2016-04-13 DIAGNOSIS — I472 Ventricular tachycardia: Secondary | ICD-10-CM

## 2016-04-13 NOTE — Progress Notes (Signed)
OFFICE NOTE  Chief Complaint:  Routine follow up, arm pain  Primary Care Physician: Alesia Richards, MD  HPI:  Robert Stanton is a 75 year old gentleman with a history of CABG in 2000 with a LIMA to the LAD. All vein grafts were noted to be occluded, and he had a St. Jude AVR which is notably functioning. EF is about 40-45% in 2012. He also has sleep apnea, COPD, and recurrent atrial fibrillation/flutter. He had cardioversion in 2012 but is back in atrial fibrillation, and I started him on amiodarone with the plan of cardioversion. He at his last visit actually declined cardioversion, however, we have kept him on amiodarone because it has seemed to benefit him as far as reducing multiple PVCs which were previously noted. Today he continues to have sinus bradycardia despite decreasing his Cardizem at the last visit from 180 mg to 120 mg daily. The heart rate remains around 48 with a significant intraventricular conduction delay suggesting underlying conduction disease. He reports a recent gout attack, and was given the lower. He's also had significant weight gain and lower extremity fluid retention. This could be due to worsening heart failure. He continues to be bradycardic, with interventricular conduction delay. I've been decreasing his amiodarone intake is about time that we discontinue it.  At his last office visit, I discontinued his amiodarone. We also increased his Bumex slightly and obtain an echocardiogram. The echocardiogram showed a stable EF of 40-45% with some mildly elevated filling pressures. Overall, there was no significant change from the prior study. 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 The renal cortex and medulla appeared bilaterally without hydronephrosis.  He does report some improvement in his swelling with the increased dose of Bumex. However, his shortness of breath is fairly stable without improvement.  He  has noted a sore that developed between the right fourth and fifth digits on the right foot. This appears to be an arterial ulcer. He did have arterial Dopplers performed in February of 2013. This showed a reduced ABI of 0.75 on the right and 1.1 on the left. The right SFA is occluded and the left SFA is occluded. There is a patent right to left fem-fem bypass graft. Right runoff was noted to be poor.  Robert Stanton follows up today from hospital. He was having some chest discomfort and was admitted this past summer with a wide complex tachycardia which is likely VT. He was seen by EP and not thought to be candidate for ablation or defibrillator due to multiple other medical problems, significant PAD and coronary artery disease with all of his bypass grafts which were occluded in 2010. This is wise not had further ischemic workup in addition he has significant chronic kidney disease. He was placed on amiodarone and this seems to have quieted some of those symptoms. He's maintaining atrial fibrillation, however which is likely permanent. He has some occasional chest discomfort which is relieved by nitroglycerin. Again I think his options are fairly limited with regards to revascularization given his poor renal function and other medical problems. He has had significant PAD however recently had repeat Dopplers which showed only about 30-40% stenosis which was a vessel that was intervened upon by Dr. Gwenlyn Found about 2 years ago.  04/12/2016  I saw Robert Stanton back in the office today for follow-up. He reports some intermittent chest discomfort taking nitroglycerin about one time a week. It seems to happen more at rest but not with  exertion. He works in his yard almost every day. He also does some rebuilding of equipment around the house. He continues to smoke unfortunately. He does have significant PAD but no symptoms of claudication. He reports some upper extremity arm pain when he raises his arms and sometimes is  associated with chest discomfort and relieved with nitroglycerin. He denies any further tachycardia arrhythmias. He had an episode in May where his INR was supratherapeutic and he had bleeding. Since then has been corrected and seems to be quite stable. He is now likely in persistent if not permanent atrial fibrillation on warfarin for which she also takes because of a mechanical aortic valve. He eventually had healing of an ulcer on his foot.  PMHx:  Past Medical History  Diagnosis Date  . Mechanical heart valve present     a. Hx mechanical St Jude AVR 2000.  . Cataracts, bilateral   . OSA (obstructive sleep apnea)   . Permanent atrial fibrillation ([place])   . Ischemic cardiomyopathy   . History of echocardiogram 04/17/2013    EF 40-45%; LV hypertrophy; LV mild-mod dilated; AV regurg.; LA severely diated, RA mildly dilated  . History of Doppler ultrasound 04/05/2013    LEAs; small distal abd. aoritc aneuysm is stable; fem-pop graft to R leg has 50-69% blockage  . History of nuclear stress test 05/14/2011    Dipyridamole; moderate perfusion defect in Basal Infrioer & Mid Inferior region - consistent w/infarct or scar; global LV systolic function mildly reduced; EKG negative for ischemia; low risk scan  . COPD (chronic obstructive pulmonary disease) ([place])   . Hyperlipidemia   . Hypertension   . Kidney cysts   . CKD (chronic kidney disease), stage III   . Hiatal hernia   . GERD (gastroesophageal reflux disease)   . Prediabetes   . Gout   . AAA (abdominal aortic aneurysm) ([place])   . Vitamin B deficiency   . Peripheral arterial disease ([place])     a. s/p aorto bifem BG;  b. 11/2013 PVA distal R Fem-fem stenosis;  c. 12/2013 PTA of dist R femoral bypass graft.  [name] CAD (coronary artery disease)     a. CABG '00. b. all SVGs occluded 2010. c. low risk Nuc 2012   . Chronic anticoagulation   . Ventricular tachycardia (paroxysmal) (HCC)     Seen by Dr. Lovena Le with EP, started on amiodarone    Past  Surgical History  Procedure Laterality Date  . Stents in kidneys    . Coronary artery bypass graft  2000    3 vessel; LIMA to LAD; RCA, OM  . Shunts in femoral arteries    . Cardioversion  8/282012  . Aortic valve replacement      St. Jude  . Femoral bypass    . Cardiac catheterization  11/25/2008    loss of 2/3 (RCA, OM) bypass grafts with patent internal mammary artery to LAD; large collateral filling of RCA ; osteal narrowing of circumflex of ~50%  . Lower extremity angiogram N/A 12/03/2013    Procedure: LOWER EXTREMITY ANGIOGRAM;  Surgeon: Lorretta Harp, MD;  Location: [facility] CATH LAB;  Service: Cardiovascular;  Laterality: N/A;  . Lower extremity angiogram N/A 01/07/2014    Procedure: LOWER EXTREMITY ANGIOGRAM;  Surgeon: Lorretta Harp, MD;  Location: [facility] CATH LAB;  Service: Cardiovascular;  Laterality: N/A;    FAMHx:  Family History  Problem Relation Age of Onset  . Heart attack Father   . Heart attack Mother   .  Lung cancer Sister     x 2  . Kidney cancer Sister   . Kidney disease Sister   . Cancer Brother     ?  . Stroke Brother     SOCHx:   reports that he has been smoking Cigarettes.  He has a 2 pack-year smoking history. He has never used smokeless tobacco. He reports that he does not drink alcohol or use illicit drugs.  ALLERGIES:  Allergies  Allergen Reactions  . Chantix [Varenicline] Nausea And Vomiting  . Lyrica [Pregabalin]     Swelling, couldn't breathe  . Nsaids Other (See Comments)    Unknown   . Simvastatin     Pt said he had a "bleed out" after taking it  . Ziac [Bisoprolol-Hydrochlorothiazide]     fatigue    ROS: Pertinent items noted in HPI and remainder of comprehensive ROS otherwise negative.  HOME MEDS: Current Outpatient Prescriptions  Medication Sig Dispense Refill  . albuterol (PROVENTIL) (2.5 MG/3ML) 0.083% nebulizer solution Take 2.5 mg by nebulization every 6 (six) hours as needed for wheezing or shortness of breath.    . allopurinol  (ZYLOPRIM) 300 MG tablet TAKE 1 TABLET BY MOUTH DAILY. TO PREVENT GOUT 90 tablet 1  . amiodarone (PACERONE) 200 MG tablet TAKE 1 TABLET (200 MG TOTAL) BY MOUTH DAILY. 30 tablet 6  . aspirin 81 MG tablet Take 81 mg by mouth daily.    . bumetanide (BUMEX) 2 MG tablet Take 1/2 tablet daily as needed for swelling 30 tablet 6  . Cholecalciferol (VITAMIN D PO) Take 5,000 Units by mouth daily.     . Cyanocobalamin (VITAMIN B 12 PO) Take 1,500 mcg by mouth daily.     . fenofibrate (TRICOR) 145 MG tablet TAKE 1 TABLET DAILY FOR BLOOD FATS 90 tablet 3  . ferrous sulfate 325 (65 FE) MG tablet Take 325 mg by mouth daily.    [name] HYDROcodone-acetaminophen (NORCO) 5-325 MG tablet Take 1 tablet by mouth every 6 (six) hours as needed. 40 tablet 0  . ipratropium-albuterol (DUONEB) 0.5-2.5 (3) MG/3ML SOLN Inhale 3 mLs into the lungs every 6 (six) hours as needed (shortness of breath).     . isosorbide mononitrate (IMDUR) 30 MG 24 hr tablet TAKE 1 TABLET BY MOUTH EVERY DAY 90 tablet 1  . levothyroxine (SYNTHROID, LEVOTHROID) 50 MCG tablet TAKE 1 TABLET BY MOUTH EVERY DAY 90 tablet 1  . montelukast (SINGULAIR) 10 MG tablet TAKE 1 TABLET (10 MG TOTAL) BY MOUTH DAILY. 30 tablet 2  . NITROSTAT 0.4 MG SL tablet PLACE 1 TABLET (0.4 MG TOTAL) UNDER THE TONGUE EVERY 5 (FIVE) MINUTES AS NEEDED FOR CHEST PAIN. 25 tablet 5  . pravastatin (PRAVACHOL) 40 MG tablet TAKE 1 TABLET (40 MG TOTAL) BY MOUTH EVERY MORNING. 90 tablet 1  . ranitidine (ZANTAC) 300 MG tablet TAKE 1 TABLET (300 MG TOTAL) BY MOUTH AT BEDTIME. 90 tablet 1  . sertraline (ZOLOFT) 50 MG tablet TAKE 1 TABLET (50 MG TOTAL) BY MOUTH DAILY. 90 tablet 1  . tiotropium (SPIRIVA) 18 MCG inhalation capsule Place 18 mcg into inhaler and inhale daily as needed (for shortness of breath).     . verapamil (CALAN) 80 MG tablet TAKE 1 TABLET (80 MG TOTAL) BY MOUTH 2 (TWO) TIMES DAILY. 180 tablet 1  . warfarin (COUMADIN) 5 MG tablet Take 1-1.5 tablets (5-7.5 mg total) by mouth  daily. Take 7.5 mg tonight then 5 mg daily thereafter until you follow up with your regular doctor who  can adjust med further based on your blood test. 90 tablet 3   No current facility-administered medications for this visit.    LABS/IMAGING: No results found for this or any previous visit (from the past 48 hour(s)). No results found.  VITALS: BP 150/60 mmHg  Pulse 59  Ht 5\' 8"  (1.727 m)  Wt 230 lb 3.2 oz (104.418 kg)  BMI 35.01 kg/m2  SpO2 97%  EXAM: Atrial fibrillation with slow ventricular response at 59  EKG: deferred  ASSESSMENT: 1. Atrial fibrillation with slow ventricular response 2. Weight gain and lower extremity edema 3. Ischemic cardiomyopathy EF 40-45% - status post CABG with occluded vein grafts and 2010 4. Aortic stenosis status post St. Jude aortic valve replacement 5. 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 Large bilateral femoropopliteal grafts, without any sign of aneurysm, and preserved ABIs bilaterally 8. Chronic kidney disease stage 3-4 9. 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   Robert Stanton has numerous medical problems. He continues to have what sounds like stable angina symptoms with infrequent use of nitroglycerin. He reports some bilateral arm pain when raising his arms which is sometimes associated with chest pain and improve with nitroglycerin. This could be upper extremity claudication. I like to check carotid and subclavian Dopplers. He recently had lower extremity arterial Dopplers performed in November of last year. He also had an echocardiogram in August of last year which showed an EF of 40-45% and a normally functioning mechanical aortic valve. He will be due for recheck echocardiogram in August of this year. No medicine changes were made today. Follow-up with me in 6 months.  Pixie Casino, MD, [facility] Attending  Cardiologist [facility] 04/13/2016, 9:08 AM

## 2016-04-13 NOTE — Patient Instructions (Addendum)
Medication Instructions:  Continue current medications  Labwork: NONE  Testing/Procedures: Your physician has requested that you have an echocardiogram in August. Echocardiography is a painless test that uses sound waves to create images of your heart. It provides your doctor with information about the size and shape of your heart and how well your heart's chambers and valves are working. This procedure takes approximately one hour. There are no restrictions for this procedure.  Your physician has requested that you have a carotid duplex. This test is an ultrasound of the carotid arteries in your neck. It looks at blood flow through these arteries that supply the brain with blood. Allow one hour for this exam. There are no restrictions or special instructions.  Follow-Up: Your physician wants you to follow-up in: 6 Months. You will receive a reminder letter in the mail two months in advance. If you don't receive a letter, please call our office to schedule the follow-up appointment.   Any Other Special Instructions Will Be Listed Below (If Applicable).   If you need a refill on your cardiac medications before your next appointment, please call your pharmacy.

## 2016-04-21 DIAGNOSIS — G4733 Obstructive sleep apnea (adult) (pediatric): Secondary | ICD-10-CM | POA: Diagnosis not present

## 2016-04-22 ENCOUNTER — Ambulatory Visit (HOSPITAL_COMMUNITY)
Admission: RE | Admit: 2016-04-22 | Discharge: 2016-04-22 | Disposition: A | Discharge status: Home / Self Care | Payer: PPO | Source: Ambulatory Visit | Attending: Internal Medicine | Admitting: Internal Medicine

## 2016-04-22 DIAGNOSIS — N183 Chronic kidney disease, stage 3 (moderate): Secondary | ICD-10-CM | POA: Insufficient documentation

## 2016-04-22 DIAGNOSIS — R7303 Prediabetes: Secondary | ICD-10-CM | POA: Insufficient documentation

## 2016-04-22 DIAGNOSIS — G4733 Obstructive sleep apnea (adult) (pediatric): Secondary | ICD-10-CM | POA: Insufficient documentation

## 2016-04-22 DIAGNOSIS — I6523 Occlusion and stenosis of bilateral carotid arteries: Secondary | ICD-10-CM | POA: Diagnosis not present

## 2016-04-22 DIAGNOSIS — I2510 Atherosclerotic heart disease of native coronary artery without angina pectoris: Secondary | ICD-10-CM | POA: Diagnosis not present

## 2016-04-22 DIAGNOSIS — M79603 Pain in arm, unspecified: Secondary | ICD-10-CM | POA: Insufficient documentation

## 2016-04-22 DIAGNOSIS — E785 Hyperlipidemia, unspecified: Secondary | ICD-10-CM | POA: Diagnosis not present

## 2016-04-22 DIAGNOSIS — J449 Chronic obstructive pulmonary disease, unspecified: Secondary | ICD-10-CM | POA: Diagnosis not present

## 2016-04-22 DIAGNOSIS — I129 Hypertensive chronic kidney disease with stage 1 through stage 4 chronic kidney disease, or unspecified chronic kidney disease: Secondary | ICD-10-CM | POA: Insufficient documentation

## 2016-04-27 ENCOUNTER — Other Ambulatory Visit: Payer: Self-pay | Admitting: Internal Medicine

## 2016-04-28 ENCOUNTER — Other Ambulatory Visit: Payer: Self-pay | Admitting: *Deleted

## 2016-04-28 DIAGNOSIS — I6523 Occlusion and stenosis of bilateral carotid arteries: Secondary | ICD-10-CM

## 2016-05-06 ENCOUNTER — Encounter: Payer: Self-pay | Admitting: Physician Assistant

## 2016-05-06 ENCOUNTER — Ambulatory Visit (INDEPENDENT_AMBULATORY_CARE_PROVIDER_SITE_OTHER): Payer: PPO | Admitting: Physician Assistant

## 2016-05-06 VITALS — BP 128/58 | HR 61 | Temp 97.5°F | Resp 16 | Ht 68.0 in | Wt 230.0 lb

## 2016-05-06 DIAGNOSIS — I129 Hypertensive chronic kidney disease with stage 1 through stage 4 chronic kidney disease, or unspecified chronic kidney disease: Secondary | ICD-10-CM | POA: Diagnosis not present

## 2016-05-06 DIAGNOSIS — I714 Abdominal aortic aneurysm, without rupture, unspecified: Secondary | ICD-10-CM

## 2016-05-06 DIAGNOSIS — F17200 Nicotine dependence, unspecified, uncomplicated: Secondary | ICD-10-CM | POA: Diagnosis not present

## 2016-05-06 DIAGNOSIS — E1122 Type 2 diabetes mellitus with diabetic chronic kidney disease: Secondary | ICD-10-CM

## 2016-05-06 DIAGNOSIS — N184 Chronic kidney disease, stage 4 (severe): Secondary | ICD-10-CM | POA: Diagnosis not present

## 2016-05-06 DIAGNOSIS — J449 Chronic obstructive pulmonary disease, unspecified: Secondary | ICD-10-CM

## 2016-05-06 DIAGNOSIS — I482 Chronic atrial fibrillation: Secondary | ICD-10-CM | POA: Diagnosis not present

## 2016-05-06 DIAGNOSIS — I739 Peripheral vascular disease, unspecified: Secondary | ICD-10-CM | POA: Diagnosis not present

## 2016-05-06 DIAGNOSIS — I4821 Permanent atrial fibrillation: Secondary | ICD-10-CM

## 2016-05-06 LAB — CBC WITH DIFFERENTIAL/PLATELET
BASOS PCT: 0 %
Basophils Absolute: 0 cells/uL (ref 0–200)
EOS PCT: 2 %
Eosinophils Absolute: 112 cells/uL (ref 15–500)
HCT: 34.6 % — ABNORMAL LOW (ref 38.5–50.0)
HEMOGLOBIN: 10.9 g/dL — AB (ref 13.2–17.1)
LYMPHS ABS: 1120 {cells}/uL (ref 850–3900)
Lymphocytes Relative: 20 %
MCH: 29.5 pg (ref 27.0–33.0)
MCHC: 31.5 g/dL — ABNORMAL LOW (ref 32.0–36.0)
MCV: 93.5 fL (ref 80.0–100.0)
MPV: 10.6 fL (ref 7.5–12.5)
Monocytes Absolute: 560 cells/uL (ref 200–950)
Monocytes Relative: 10 %
NEUTROS ABS: 3808 {cells}/uL (ref 1500–7800)
Neutrophils Relative %: 68 %
Platelets: 212 10*3/uL (ref 140–400)
RBC: 3.7 MIL/uL — AB (ref 4.20–5.80)
RDW: 16.3 % — ABNORMAL HIGH (ref 11.0–15.0)
WBC: 5.6 10*3/uL (ref 3.8–10.8)

## 2016-05-06 LAB — BASIC METABOLIC PANEL WITH GFR
BUN: 29 mg/dL — AB (ref 7–25)
CHLORIDE: 102 mmol/L (ref 98–110)
CO2: 27 mmol/L (ref 20–31)
Calcium: 8.8 mg/dL (ref 8.6–10.3)
Creat: 2.26 mg/dL — ABNORMAL HIGH (ref 0.70–1.18)
GFR, EST AFRICAN AMERICAN: 32 mL/min — AB (ref >=60)
GFR, EST NON AFRICAN AMERICAN: 28 mL/min — AB (ref >=60)
GLUCOSE: 139 mg/dL — AB (ref 65–99)
Potassium: 3.9 mmol/L (ref 3.5–5.3)
Sodium: 139 mmol/L (ref 135–146)

## 2016-05-06 LAB — PROTIME-INR
INR: 3.9 — AB
Prothrombin Time: 38.9 s — ABNORMAL HIGH (ref 9.0–11.5)

## 2016-05-06 MED ORDER — NICOTINE 21 MG/24HR TD PT24: 21.0000 mg | MEDICATED_PATCH | TRANSDERMAL | Status: DC

## 2016-05-06 NOTE — Progress Notes (Signed)
Assessment and Plan: 1. 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 - BASIC METABOLIC PANEL WITH GFR  2. Permanent atrial fibrillation (HCC) Rate controlled, continue coumadin - Protime-INR - CBC with Differential/Platelet  3. Peripheral arterial disease (HCC) Control blood pressure, cholesterol, glucose, increase exercise.  Advised to quit smoking  4. Abdominal aortic aneurysm (AAA) without rupture (HCC) Control blood pressure, cholesterol, glucose, increase exercise.  Advised to quit smoking  5. Chronic obstructive pulmonary disease, unspecified COPD type ([place]) Advised to stop smoking, continue meds.   6. Type 2 diabetes mellitus with stage 4 chronic kidney disease, without long-term current use of insulin ([place]) Discussed general issues about diabetes pathophysiology and management., Educational material distributed., Suggested low cholesterol diet., Encouraged aerobic exercise., Discussed foot care., Reminded to get yearly retinal exam.  7. Tobacco use disorder Patient is interested in quitting at this time, we will try patches at first, rediscussed chantix, had nausea before, he may be willing to retry depending on price.  - nicotine (NICODERM CQ - DOSED IN MG/24 HOURS) 21 mg/24hr patch; Place 1 patch (21 mg total) onto the skin daily.  Dispense: 28 patch; Refill: 1   HPI 75 y.o.male presents for 1 month follow up of long term coumadin use for chronic atrial fibrillation. Patient reports that they have been doing well.  He is not having severe, bruising, bleeding, epistaxis, melena, or hematochezia.  He does take his coumadin daily.  He is on 2.5 mg 3 days a week and 5 mg 4 days a week.  He has CHF, his weight is stable, SOB is at baseline, denies  any recent CP, no NTG x 4 weeks.  Lab Results  Component Value Date   INR 2.1* 04/08/2016   INR 3.28* 03/09/2016   INR 2.19* 03/07/2016    Wt Readings from Last 3 Encounters:  05/06/16 230 lb (104.327 kg)  04/13/16 230 lb 3.2 oz (104.418 kg)  04/08/16 233 lb (105.688 kg)    Past Medical History  Diagnosis Date  . Mechanical heart valve present     a. Hx mechanical St Jude AVR 2000.  . Cataracts, bilateral   . OSA (obstructive sleep apnea)   . Permanent atrial fibrillation ([place])   . Ischemic cardiomyopathy   . History of echocardiogram 04/17/2013    EF 40-45%; LV hypertrophy; LV mild-mod dilated; AV regurg.; LA severely diated, RA mildly dilated  . History of Doppler ultrasound 04/05/2013    LEAs; small distal abd. aoritc aneuysm is stable; fem-pop graft to R leg has 50-69% blockage  . History of nuclear stress test 05/14/2011    Dipyridamole; moderate perfusion defect in Basal Infrioer & Mid Inferior region - consistent w/infarct or scar; global LV systolic function mildly reduced; EKG negative for ischemia; low risk scan  . COPD (chronic obstructive pulmonary disease) ([place])   . Hyperlipidemia   . Hypertension   . Kidney cysts   . CKD (chronic kidney disease), stage III   . Hiatal hernia   . GERD (gastroesophageal reflux disease)   . Prediabetes   . Gout   . AAA (abdominal aortic aneurysm) ([place])   . Vitamin B deficiency   . Peripheral arterial disease ([place])     a. s/p aorto bifem BG;  b. 11/2013 PVA distal R Fem-fem stenosis;  c. 12/2013 PTA of dist R femoral  bypass graft.  [name] CAD (coronary artery disease)     a. CABG '00. b. all SVGs occluded 2010. c. low risk Nuc 2012   . Chronic anticoagulation   . Ventricular tachycardia (paroxysmal) ([place])     Seen by Dr. Lovena Le with EP, started on amiodarone     Allergies  Allergen Reactions  . Chantix [Varenicline] Nausea And Vomiting  . Lyrica [Pregabalin]     Swelling, couldn't breathe  . Nsaids Other (See Comments)    Unknown    . Simvastatin     Pt said he had a "bleed out" after taking it  . Ziac [Bisoprolol-Hydrochlorothiazide]     fatigue      Current Outpatient Prescriptions on File Prior to Visit  Medication Sig Dispense Refill  . albuterol (PROVENTIL) (2.5 MG/3ML) 0.083% nebulizer solution Take 2.5 mg by nebulization every 6 (six) hours as needed for wheezing or shortness of breath.    . allopurinol (ZYLOPRIM) 300 MG tablet TAKE 1 TABLET BY MOUTH DAILY. TO PREVENT GOUT 90 tablet 1  . amiodarone (PACERONE) 200 MG tablet TAKE 1 TABLET (200 MG TOTAL) BY MOUTH DAILY. 30 tablet 6  . aspirin 81 MG tablet Take 81 mg by mouth daily.    . bumetanide (BUMEX) 2 MG tablet Take 1/2 tablet daily as needed for swelling 30 tablet 6  . Cholecalciferol (VITAMIN D PO) Take 5,000 Units by mouth daily.     . Cyanocobalamin (VITAMIN B 12 PO) Take 1,500 mcg by mouth daily.     . fenofibrate (TRICOR) 145 MG tablet TAKE 1 TABLET DAILY FOR BLOOD FATS 90 tablet 3  . ferrous sulfate 325 (65 FE) MG tablet Take 325 mg by mouth daily.    [name] HYDROcodone-acetaminophen (NORCO) 5-325 MG tablet Take 1 tablet by mouth every 6 (six) hours as needed. 40 tablet 0  . ipratropium-albuterol (DUONEB) 0.5-2.5 (3) MG/3ML SOLN Inhale 3 mLs into the lungs every 6 (six) hours as needed (shortness of breath).     . isosorbide mononitrate (IMDUR) 30 MG 24 hr tablet TAKE 1 TABLET BY MOUTH EVERY DAY 90 tablet 1  . levothyroxine (SYNTHROID, LEVOTHROID) 50 MCG tablet TAKE 1 TABLET BY MOUTH EVERY DAY 90 tablet 1  . montelukast (SINGULAIR) 10 MG tablet TAKE 1 TABLET (10 MG TOTAL) BY MOUTH DAILY. 30 tablet 2  . NITROSTAT 0.4 MG SL tablet PLACE 1 TABLET (0.4 MG TOTAL) UNDER THE TONGUE EVERY 5 (FIVE) MINUTES AS NEEDED FOR CHEST PAIN. 25 tablet 5  . pravastatin (PRAVACHOL) 40 MG tablet TAKE 1 TABLET (40 MG TOTAL) BY MOUTH EVERY MORNING. 90 tablet 1  . ranitidine (ZANTAC) 300 MG tablet TAKE 1 TABLET (300 MG TOTAL) BY MOUTH AT BEDTIME. 90 tablet 1  . sertraline  (ZOLOFT) 50 MG tablet TAKE 1 TABLET (50 MG TOTAL) BY MOUTH DAILY. 90 tablet 1  . tiotropium (SPIRIVA) 18 MCG inhalation capsule Place 18 mcg into inhaler and inhale daily as needed (for shortness of breath).     . verapamil (CALAN) 80 MG tablet TAKE 1 TABLET (80 MG TOTAL) BY MOUTH 2 (TWO) TIMES DAILY. 180 tablet 1  . warfarin (COUMADIN) 5 MG tablet Take 1-1.5 tablets (5-7.5 mg total) by mouth daily. Take 7.5 mg tonight then 5 mg daily thereafter until you follow up with your regular doctor who can adjust med further based on your blood test. 90 tablet 3   No current facility-administered medications on file prior to visit.    ROS: all negative except above.  Physical Exam: Filed Weights   05/06/16 1111  Weight: 230 lb (104.327 kg)   BP 128/58 mmHg  Pulse 61  Temp(Src) 97.5 F (36.4 C) (Temporal)  Resp 16  Ht 5\' 8"  (1.727 m)  Wt 230 lb (104.327 kg)  BMI 34.98 kg/m2  SpO2 98% General Appearance: Well developed well nourished, non-toxic appearing in no apparent distress. Eyes: PERRLA, EOMs, conjunctiva w/ no swelling or erythema or discharge Sinuses: No Frontal/maxillary tenderness ENT/Mouth: Ear canals clear without swelling or erythema.  TM's normal bilaterally with no retractions, bulging, or loss of landmarks.   Neck: Supple, thyroid normal, no notable JVD  Respiratory: Respiratory effort mildly increased, Clear but distant breath sounds anteriorly and posteriorly bilaterally without rales, rhonchi, or stridor.  There is the occasional end expiratory wheeze. No retractions or accessory muscle usage. Cardio: Ireg ireg with no MRGs.   Abdomen: Soft, + BS.  Non tender, no guarding, rebound, hernias, masses.  Musculoskeletal: Full ROM, 5/5 strength, normal gait.  Skin: Warm, dry without rashes  Neuro: Awake and oriented X 3, Cranial nerves intact. Normal muscle tone, no cerebellar symptoms. S Psych: normal affect, Insight and Judgment appropriate.     Vicie Mutters, PA-C 12:48  PM [facility]

## 2016-05-07 DIAGNOSIS — N189 Chronic kidney disease, unspecified: Secondary | ICD-10-CM | POA: Diagnosis not present

## 2016-05-07 DIAGNOSIS — N2581 Secondary hyperparathyroidism of renal origin: Secondary | ICD-10-CM | POA: Diagnosis not present

## 2016-05-07 DIAGNOSIS — N184 Chronic kidney disease, stage 4 (severe): Secondary | ICD-10-CM | POA: Diagnosis not present

## 2016-05-12 DIAGNOSIS — I129 Hypertensive chronic kidney disease with stage 1 through stage 4 chronic kidney disease, or unspecified chronic kidney disease: Secondary | ICD-10-CM | POA: Diagnosis not present

## 2016-05-12 DIAGNOSIS — D631 Anemia in chronic kidney disease: Secondary | ICD-10-CM | POA: Diagnosis not present

## 2016-05-12 DIAGNOSIS — N184 Chronic kidney disease, stage 4 (severe): Secondary | ICD-10-CM | POA: Diagnosis not present

## 2016-05-12 DIAGNOSIS — Z6835 Body mass index (BMI) 35.0-35.9, adult: Secondary | ICD-10-CM | POA: Diagnosis not present

## 2016-05-12 DIAGNOSIS — N2581 Secondary hyperparathyroidism of renal origin: Secondary | ICD-10-CM | POA: Diagnosis not present

## 2016-05-19 ENCOUNTER — Other Ambulatory Visit: Payer: Self-pay | Admitting: Internal Medicine

## 2016-05-20 ENCOUNTER — Other Ambulatory Visit: Payer: Self-pay

## 2016-05-20 ENCOUNTER — Ambulatory Visit (HOSPITAL_COMMUNITY): Payer: PPO | Attending: Cardiology

## 2016-05-20 DIAGNOSIS — E119 Type 2 diabetes mellitus without complications: Secondary | ICD-10-CM | POA: Diagnosis not present

## 2016-05-20 DIAGNOSIS — G4733 Obstructive sleep apnea (adult) (pediatric): Secondary | ICD-10-CM | POA: Diagnosis not present

## 2016-05-20 DIAGNOSIS — I2510 Atherosclerotic heart disease of native coronary artery without angina pectoris: Secondary | ICD-10-CM | POA: Diagnosis not present

## 2016-05-20 DIAGNOSIS — E785 Hyperlipidemia, unspecified: Secondary | ICD-10-CM | POA: Diagnosis not present

## 2016-05-20 DIAGNOSIS — E669 Obesity, unspecified: Secondary | ICD-10-CM | POA: Insufficient documentation

## 2016-05-20 DIAGNOSIS — M79603 Pain in arm, unspecified: Secondary | ICD-10-CM | POA: Diagnosis not present

## 2016-05-20 DIAGNOSIS — Z951 Presence of aortocoronary bypass graft: Secondary | ICD-10-CM | POA: Diagnosis not present

## 2016-05-20 DIAGNOSIS — Z952 Presence of prosthetic heart valve: Secondary | ICD-10-CM | POA: Diagnosis not present

## 2016-05-20 DIAGNOSIS — I119 Hypertensive heart disease without heart failure: Secondary | ICD-10-CM | POA: Insufficient documentation

## 2016-05-20 DIAGNOSIS — R079 Chest pain, unspecified: Secondary | ICD-10-CM | POA: Diagnosis not present

## 2016-05-20 DIAGNOSIS — J449 Chronic obstructive pulmonary disease, unspecified: Secondary | ICD-10-CM | POA: Insufficient documentation

## 2016-05-20 DIAGNOSIS — I351 Nonrheumatic aortic (valve) insufficiency: Secondary | ICD-10-CM | POA: Insufficient documentation

## 2016-05-20 DIAGNOSIS — I272 Other secondary pulmonary hypertension: Secondary | ICD-10-CM | POA: Insufficient documentation

## 2016-05-20 DIAGNOSIS — I340 Nonrheumatic mitral (valve) insufficiency: Secondary | ICD-10-CM | POA: Insufficient documentation

## 2016-05-20 DIAGNOSIS — Z6835 Body mass index (BMI) 35.0-35.9, adult: Secondary | ICD-10-CM | POA: Diagnosis not present

## 2016-05-20 MED ORDER — PERFLUTREN LIPID MICROSPHERE
1.0000 mL | INTRAVENOUS | Status: AC | PRN
  Administered 2016-05-20: 2 mL via INTRAVENOUS

## 2016-05-21 ENCOUNTER — Ambulatory Visit (INDEPENDENT_AMBULATORY_CARE_PROVIDER_SITE_OTHER): Payer: PPO | Admitting: *Deleted

## 2016-05-21 DIAGNOSIS — Z7901 Long term (current) use of anticoagulants: Secondary | ICD-10-CM | POA: Diagnosis not present

## 2016-05-21 NOTE — Progress Notes (Signed)
Patient here for 2 week NV to recheck PT/INR.  Patient is taking Coumadin 2.5 mg x 5 days a week and 5 mg on Monday and Friday.

## 2016-05-22 DIAGNOSIS — G4733 Obstructive sleep apnea (adult) (pediatric): Secondary | ICD-10-CM | POA: Diagnosis not present

## 2016-05-22 LAB — PROTIME-INR
INR: 2.6 — ABNORMAL HIGH
PROTHROMBIN TIME: 26.7 s — AB (ref 9.0–11.5)

## 2016-05-26 ENCOUNTER — Other Ambulatory Visit (HOSPITAL_COMMUNITY): Payer: Self-pay | Admitting: *Deleted

## 2016-05-27 ENCOUNTER — Ambulatory Visit (HOSPITAL_COMMUNITY)
Admission: RE | Admit: 2016-05-27 | Discharge: 2016-05-27 | Disposition: A | Discharge status: Home / Self Care | Payer: PPO | Source: Ambulatory Visit | Attending: Nephrology | Admitting: Nephrology

## 2016-05-27 DIAGNOSIS — D509 Iron deficiency anemia, unspecified: Secondary | ICD-10-CM | POA: Diagnosis not present

## 2016-05-27 MED ORDER — SODIUM CHLORIDE 0.9 % IV SOLN
510.0000 mg | INTRAVENOUS | Status: DC
  Administered 2016-05-27: 510 mg via INTRAVENOUS
  Filled 2016-05-27: qty 17

## 2016-05-27 NOTE — Discharge Instructions (Signed)

## 2016-06-02 ENCOUNTER — Encounter: Payer: Self-pay | Admitting: Internal Medicine

## 2016-06-02 ENCOUNTER — Other Ambulatory Visit: Payer: Self-pay | Admitting: Internal Medicine

## 2016-06-02 ENCOUNTER — Ambulatory Visit (INDEPENDENT_AMBULATORY_CARE_PROVIDER_SITE_OTHER): Payer: PPO | Admitting: Internal Medicine

## 2016-06-02 VITALS — BP 122/70 | HR 62 | Temp 97.3°F | Resp 16 | Ht 67.0 in | Wt 230.8 lb

## 2016-06-02 DIAGNOSIS — M1000 Idiopathic gout, unspecified site: Secondary | ICD-10-CM

## 2016-06-02 DIAGNOSIS — N184 Chronic kidney disease, stage 4 (severe): Secondary | ICD-10-CM | POA: Diagnosis not present

## 2016-06-02 DIAGNOSIS — K219 Gastro-esophageal reflux disease without esophagitis: Secondary | ICD-10-CM

## 2016-06-02 DIAGNOSIS — R6889 Other general symptoms and signs: Secondary | ICD-10-CM

## 2016-06-02 DIAGNOSIS — I4821 Permanent atrial fibrillation: Secondary | ICD-10-CM

## 2016-06-02 DIAGNOSIS — Z0001 Encounter for general adult medical examination with abnormal findings: Secondary | ICD-10-CM

## 2016-06-02 DIAGNOSIS — N320 Bladder-neck obstruction: Secondary | ICD-10-CM | POA: Diagnosis not present

## 2016-06-02 DIAGNOSIS — Z79899 Other long term (current) drug therapy: Secondary | ICD-10-CM

## 2016-06-02 DIAGNOSIS — Z7901 Long term (current) use of anticoagulants: Secondary | ICD-10-CM

## 2016-06-02 DIAGNOSIS — I10 Essential (primary) hypertension: Secondary | ICD-10-CM | POA: Diagnosis not present

## 2016-06-02 DIAGNOSIS — Z1211 Encounter for screening for malignant neoplasm of colon: Secondary | ICD-10-CM | POA: Diagnosis not present

## 2016-06-02 DIAGNOSIS — I2510 Atherosclerotic heart disease of native coronary artery without angina pectoris: Secondary | ICD-10-CM

## 2016-06-02 DIAGNOSIS — Z01810 Encounter for preprocedural cardiovascular examination: Secondary | ICD-10-CM | POA: Insufficient documentation

## 2016-06-02 DIAGNOSIS — E559 Vitamin D deficiency, unspecified: Secondary | ICD-10-CM | POA: Diagnosis not present

## 2016-06-02 DIAGNOSIS — E782 Mixed hyperlipidemia: Secondary | ICD-10-CM

## 2016-06-02 DIAGNOSIS — Z125 Encounter for screening for malignant neoplasm of prostate: Secondary | ICD-10-CM

## 2016-06-02 DIAGNOSIS — I482 Chronic atrial fibrillation: Secondary | ICD-10-CM

## 2016-06-02 DIAGNOSIS — Z136 Encounter for screening for cardiovascular disorders: Secondary | ICD-10-CM

## 2016-06-02 DIAGNOSIS — I129 Hypertensive chronic kidney disease with stage 1 through stage 4 chronic kidney disease, or unspecified chronic kidney disease: Secondary | ICD-10-CM | POA: Diagnosis not present

## 2016-06-02 DIAGNOSIS — E1122 Type 2 diabetes mellitus with diabetic chronic kidney disease: Secondary | ICD-10-CM | POA: Diagnosis not present

## 2016-06-02 LAB — PROTIME-INR
INR: 1.6 — AB
Prothrombin Time: 16.9 s — ABNORMAL HIGH (ref 9.0–11.5)

## 2016-06-02 NOTE — Patient Instructions (Signed)

## 2016-06-02 NOTE — Progress Notes (Signed)
[facility]   Unk Pinto, M.D.    [name]. [name], P.A.-C      [name], P.A.-C   [facility]                [address] [place] [id number] Telephone [phone] Telefax [phone]  Annual  Screening/Preventative Visit And Comprehensive Evaluation & Examination     This very nice 75y.o.MWM presents for a Wellness/Preventative Visit & comprehensive evaluation and management of multiple medical co-morbidities.  Patient has been followed for HTN, T2_NIDDM  W/CKD3,  ASCVD, ASPVD, Hyperlipidemia and Vitamin D Deficiency. Patient also has COPD with 50+ yr hx/o smoking and continues heavy smoking. Patient's gout is controlled.      HTN predates circa 1980 and in 200 underent a CABG & St Jude AoVR. Patient is followed Dy Dr [name]. Patient's BP has been controlled at home.Today's BP: 122/70. Other problems include cAfib also circa 2000 and ASCVD and hx remote TIA, ASPVD now s/p AoBiFem BPG & Rt BiFem PCA and also hx/o Renal Aa Stenosis. Patient denies any cardiac symptoms as chest pain, palpitations, shortness of breath, dizziness or ankle swelling.     Patient's hyperlipidemia is controlled with diet and medications. Patient denies myalgias or other medication SE's. Last lipids were at goal: Lab Results  Component Value Date   CHOL 138 03/09/2016   HDL 37 (L) 03/09/2016   LDLCALC 75 03/09/2016   TRIG 128 03/09/2016   CHOLHDL 3.7 03/09/2016      Patient has Morbid Obesity (BMI >35+) and consequent T2_NIDDM w/CKD4 (GFR 28 ml/min) since 2010 and patient denies reactive hypoglycemic symptoms, visual blurring, diabetic polys or paresthesias. Patient is followed by Dr Posey Pronto for his CKD4. Last A1c was not at goal: Lab Results  Component Value Date   HGBA1C 5.9 (H) 03/09/2016       Patient also has been on Thyroid replacement circa 2015.Finally, patient has history of Vitamin D Deficiency in  2009 of "22" and last vitamin D was still very low: Lab Results  Component Value Date   VD25OH 50 03/09/2016   Current Outpatient Prescriptions on File Prior to Visit  Medication Sig  . albuterol (PROVENTIL) (2.5 MG/3ML) 0.083% nebulizer solution Take 2.5 mg by nebulization every 6 (six) hours as needed for wheezing or shortness of breath.  . allopurinol (ZYLOPRIM) 300 MG tablet TAKE 1 TABLET BY MOUTH DAILY. TO PREVENT GOUT  . amiodarone (PACERONE) 200 MG tablet TAKE 1 TABLET (200 MG TOTAL) BY MOUTH DAILY.  [name] aspirin 81 MG tablet Take 81 mg by mouth daily.  . bumetanide (BUMEX) 2 MG tablet Take 1/2 tablet daily as needed for swelling  . Cholecalciferol (VITAMIN D PO) Take 5,000 Units by mouth daily.   . Cyanocobalamin (VITAMIN B 12 PO) Take 1,500 mcg by mouth daily.   . fenofibrate (TRICOR) 145 MG tablet TAKE 1 TABLET DAILY FOR BLOOD FATS  . ferrous sulfate 325 (65 FE) MG tablet Take 325 mg by mouth daily.  [name] HYDROcodone-acetaminophen (NORCO) 5-325 MG tablet Take 1 tablet by mouth every 6 (six) hours as needed.  [name] ipratropium-albuterol (DUONEB) 0.5-2.5 (3) MG/3ML SOLN Inhale 3 mLs into the lungs every 6 (six) hours as needed (shortness of breath).   . isosorbide mononitrate (IMDUR) 30 MG 24 hr tablet TAKE 1 TABLET BY MOUTH EVERY DAY  . levothyroxine (SYNTHROID, LEVOTHROID) 50 MCG  tablet TAKE 1 TABLET BY MOUTH EVERY DAY  . montelukast (SINGULAIR) 10 MG tablet TAKE 1 TABLET (10 MG TOTAL) BY MOUTH DAILY.  [name] NITROSTAT 0.4 MG SL tablet PLACE 1 TABLET (0.4 MG TOTAL) UNDER THE TONGUE EVERY 5 (FIVE) MINUTES AS NEEDED FOR CHEST PAIN.  . ranitidine (ZANTAC) 300 MG tablet TAKE 1 TABLET (300 MG TOTAL) BY MOUTH AT BEDTIME.  [name] sertraline (ZOLOFT) 50 MG tablet TAKE 1 TABLET (50 MG TOTAL) BY MOUTH DAILY.  [name] tiotropium (SPIRIVA) 18 MCG inhalation capsule Place 18 mcg into inhaler and inhale daily as needed (for shortness of breath).   . verapamil (CALAN) 80 MG tablet TAKE 1 TABLET (80 MG TOTAL) BY MOUTH 2  (TWO) TIMES DAILY.  [name] warfarin (COUMADIN) 5 MG tablet Take 1-1.5 tablets (5-7.5 mg total) by mouth daily. Take 7.5 mg tonight then 5 mg daily thereafter until you follow up with your regular doctor who can adjust med further based on your blood test.   No current facility-administered medications on file prior to visit.    Allergies  Allergen Reactions  . Chantix [Varenicline] Nausea And Vomiting  . Lyrica [Pregabalin]     Swelling, couldn't breathe  . Nsaids Other (See Comments)    Unknown   . Simvastatin     Pt said he had a "bleed out" after taking it  . Ziac [Bisoprolol-Hydrochlorothiazide]     fatigue   Past Medical History:  Diagnosis Date  . AAA (abdominal aortic aneurysm) ([place])   . CAD (coronary artery disease)    a. CABG '00. b. all SVGs occluded 2010. c. low risk Nuc 2012   . Cataracts, bilateral   . Chronic anticoagulation   . CKD (chronic kidney disease), stage III   . COPD (chronic obstructive pulmonary disease) ([place])   . GERD (gastroesophageal reflux disease)   . Gout   . Hiatal hernia   . History of Doppler ultrasound 04/05/2013   LEAs; small distal abd. aoritc aneuysm is stable; fem-pop graft to R leg has 50-69% blockage  . History of echocardiogram 04/17/2013   EF 40-45%; LV hypertrophy; LV mild-mod dilated; AV regurg.; LA severely diated, RA mildly dilated  . History of nuclear stress test 05/14/2011   Dipyridamole; moderate perfusion defect in Basal Infrioer & Mid Inferior region - consistent w/infarct or scar; global LV systolic function mildly reduced; EKG negative for ischemia; low risk scan  . Hyperlipidemia   . Hypertension   . Ischemic cardiomyopathy   . Kidney cysts   . Mechanical heart valve present    a. Hx mechanical St Jude AVR 2000.  [name] OSA (obstructive sleep apnea)   . Peripheral arterial disease ([place])    a. s/p aorto bifem BG;  b. 11/2013 PVA distal R Fem-fem stenosis;  c. 12/2013 PTA of dist R femoral bypass graft.  . Permanent atrial  fibrillation ([place])   . Prediabetes   . Ventricular tachycardia (paroxysmal) ([place])    Seen by Dr. Lovena Le with EP, started on amiodarone  . Vitamin B deficiency    Health Maintenance  Topic Date Due  . OPHTHALMOLOGY EXAM  03/18/2016  . FOOT EXAM  05/14/2016  . URINE MICROALBUMIN  05/14/2016  . INFLUENZA VACCINE  05/18/2016  . HEMOGLOBIN A1C  09/09/2016  . COLONOSCOPY  08/08/2017  . TETANUS/TDAP  01/24/2022  . ZOSTAVAX  Completed  . PNA vac Low Risk Adult  Completed   Immunization History  Administered Date(s) Administered  . DT 01/25/2012  . Influenza Split 06/28/2013  .  Influenza, High Dose Seasonal PF 08/02/2014, 07/17/2015  . Pneumococcal Conjugate-13 03/25/2014  . Pneumococcal Polysaccharide-23 08/11/2012  . Zoster 10/30/2010   Past Surgical History:  Procedure Laterality Date  . AORTIC VALVE REPLACEMENT     St. Jude  . CARDIAC CATHETERIZATION  11/25/2008   loss of 2/3 (RCA, OM) bypass grafts with patent internal mammary artery to LAD; large collateral filling of RCA ; osteal narrowing of circumflex of ~50%  . CARDIOVERSION  8/282012  . CORONARY ARTERY BYPASS GRAFT  2000   3 vessel; LIMA to LAD; RCA, OM  . FEMORAL BYPASS    . LOWER EXTREMITY ANGIOGRAM N/A 12/03/2013   Procedure: LOWER EXTREMITY ANGIOGRAM;  Surgeon: Lorretta Harp, MD;  [facility] LAB;  Service: Cardiovascular;  Laterality: N/A;  . LOWER EXTREMITY ANGIOGRAM N/A 01/07/2014   Procedure: LOWER EXTREMITY ANGIOGRAM;  Surgeon: Lorretta Harp, MD;  Location: [facility] CATH LAB;  Service: Cardiovascular;  Laterality: N/A;  . shunts in femoral arteries    . stents in kidneys     Family History  Problem Relation Age of Onset  . Heart attack Father   . Heart attack Mother   . Lung cancer Sister     x 2  . Kidney cancer Sister   . Kidney disease Sister   . Cancer Brother     ?  . Stroke Brother    Social History   Social History  . Marital status: Married    Spouse name: N/A  . Number of children: 3   . Years of education: N/A   Occupational History  . Not on file.   Social History Main Topics  . Smoking status: Current Every Day Smoker    Packs/day: 0.05    Years: 40.00    Types: Cigarettes  . Smokeless tobacco: Never Used  . Alcohol use No  . Drug use: No  . Sexual activity: Not on file   Other Topics Concern  . Not on file   Social History Narrative   Lives in [place] with disabled wife, three sons, and grandson   Retired    ROS Constitutional: Denies fever, chills, weight loss/gain, headaches, insomnia,  night sweats or change in appetite. Does c/o fatigue. Eyes: Denies redness, blurred vision, diplopia, discharge, itchy or watery eyes.  ENT: Denies discharge, congestion, post nasal drip, epistaxis, sore throat, earache, hearing loss, dental pain, Tinnitus, Vertigo, Sinus pain or snoring.  Cardio: Denies chest pain, palpitations, irregular heartbeat, syncope, dyspnea, diaphoresis, orthopnea, PND, claudication or edema Respiratory: denies cough, dyspnea, DOE, pleurisy, hoarseness, laryngitis or wheezing.  Gastrointestinal: Denies dysphagia, heartburn, reflux, water brash, pain, cramps, nausea, vomiting, bloating, diarrhea, constipation, hematemesis, melena, hematochezia, jaundice or hemorrhoids Genitourinary: Denies dysuria, frequency, urgency, nocturia, hesitancy, discharge, hematuria or flank pain Musculoskeletal: Denies arthralgia, myalgia, stiffness, Jt. Swelling, pain, limp or strain/sprain. Denies Falls. Skin: Denies puritis, rash, hives, warts, acne, eczema or change in skin lesion Neuro: No weakness, tremor, incoordination, spasms, paresthesia or pain Psychiatric: Denies confusion, memory loss or sensory loss. Denies Depression. Endocrine: Denies change in weight, skin, hair change, nocturia, and paresthesia, diabetic polys, visual blurring or hyper / hypo glycemic episodes.  Heme/Lymph: No excessive bleeding, bruising or enlarged lymph nodes.  Physical  Exam  BP 122/70   Pulse 62   Temp 97.3 F (36.3 C)   Resp 16   Ht 5\' 7"  (1.702 m)   Wt 230 lb 12.8 oz (104.7 kg)   SpO2 98%   BMI 36.15 kg/m  General Appearance: Well nourished, in no apparent distress.  Eyes: PERRLA, EOMs, conjunctiva no swelling or erythema, normal fundi and vessels. Sinuses: No frontal/maxillary tenderness ENT/Mouth: EACs patent / TMs  nl. Nares clear without erythema, swelling, mucoid exudates. Oral hygiene is good. No erythema, swelling, or exudate. Tongue normal, non-obstructing. Tonsils not swollen or erythematous. Hearing normal.  Neck: Supple, thyroid normal. No bruits, nodes or JVD. Respiratory: Respiratory effort normal.  BS equal and clear bilateral without rales, rhonci, wheezing or stridor. Cardio: Heart sounds are soft w/crisp prosthetic AoV sounds with irregular rate and rhythm and gr 2 Ao flow murmer. Peripheral pulses are 1+/1+ and equal bilaterally with 1+ edema. No aortic or femoral bruits. Chest:  Increased AP diameter w/barrel configuration.  Abdomen: Soft, with Nl bowel sounds. Nontender, no guarding, rebound, hernias, masses, or organomegaly.  Lymphatics: Non tender without lymphadenopathy.  Genitourinary: No hernias.Testes nl. DRE - prostate nl for age - smooth & firm w/o nodules. Musculoskeletal: Full ROM all peripheral extremities with generalized decrease in muscle power, tone & bulk. Normal gait. Skin: Warm and dry without rashes, lesions, cyanosis, clubbing or  ecchymosis.  Neuro: Cranial nerves intact, reflexes equal bilaterally. Normal muscle tone, no cerebellar symptoms. Sensation intact.  Pysch: Alert and oriented X 3 with normal affect, insight and judgment appropriate.   Assessment and Plan  1. Annual Preventative/Screening Exam   2. Essential hypertension  - TSH - EKG 12-Lead - [place], RETROPERITNL ABD,  LTD  3. Hyperlipidemia  - Lipid panel - TSH - EKG 12-Lead - [place], RETROPERITNL ABD,  LTD  4. 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 Vitamin D deficiency  - VITAMIN D 25 Hydroxy   6. Permanent atrial fibrillation (HCC)  - EKG 12-Lead - Protime-INR  7. Idiopathic gout  - Uric acid  8. Atherosclerosis of native coronary artery of native heart without angina pectoris   9. Gastroesophageal reflux disease   10. Long term current use of anticoagulant therapy  - Protime-INR  11. Colon cancer screening  - POC Hemoccult Bld/Stl   12. Prostate cancer screening  - PSA  13. Screening for ischemic heart disease  - EKG 12-Lead  14. Screening for AAA (aortic abdominal aneurysm)  - [place], RETROPERITNL ABD,  LTD  15. Medication management  - CBC with Differential/Platelet - BASIC METABOLIC PANEL WITH GFR - Hepatic function panel - Magnesium - Urinalysis, Routine w reflex microscopic   16. Atherosclerosis of native coronary artery without angina pectoris, unspecified whether native or transplanted heart      Continue prudent diet as discussed, weight control, BP monitoring, regular exercise, and medications as discussed.  Discussed med effects and SE's. Routine screening labs and tests as requested with regular follow-up as recommended. Over 40 minutes of exam, counseling, chart review and high complex critical decision making was performed

## 2016-06-03 ENCOUNTER — Ambulatory Visit (HOSPITAL_COMMUNITY)
Admission: RE | Admit: 2016-06-03 | Discharge: 2016-06-03 | Disposition: A | Discharge status: Home / Self Care | Payer: PPO | Source: Ambulatory Visit | Attending: Nephrology | Admitting: Nephrology

## 2016-06-03 DIAGNOSIS — D509 Iron deficiency anemia, unspecified: Secondary | ICD-10-CM | POA: Insufficient documentation

## 2016-06-03 LAB — LIPID PANEL
CHOLESTEROL: 152 mg/dL (ref 125–200)
HDL: 35 mg/dL — ABNORMAL LOW (ref >=40)
LDL Cholesterol: 89 mg/dL (ref <130)
Total CHOL/HDL Ratio: 4.3 Ratio (ref <=5.0)
Triglycerides: 140 mg/dL (ref <150)
VLDL: 28 mg/dL (ref <30)

## 2016-06-03 LAB — CBC WITH DIFFERENTIAL/PLATELET
BASOS ABS: 58 {cells}/uL (ref 0–200)
Basophils Relative: 1 %
EOS PCT: 2 %
Eosinophils Absolute: 116 cells/uL (ref 15–500)
HEMATOCRIT: 35.3 % — AB (ref 38.5–50.0)
HEMOGLOBIN: 11 g/dL — AB (ref 13.2–17.1)
LYMPHS ABS: 1276 {cells}/uL (ref 850–3900)
Lymphocytes Relative: 22 %
MCH: 29.3 pg (ref 27.0–33.0)
MCHC: 31.2 g/dL — AB (ref 32.0–36.0)
MCV: 94.1 fL (ref 80.0–100.0)
MPV: 10.4 fL (ref 7.5–12.5)
Monocytes Absolute: 696 cells/uL (ref 200–950)
Monocytes Relative: 12 %
NEUTROS PCT: 63 %
Neutro Abs: 3654 cells/uL (ref 1500–7800)
Platelets: 219 10*3/uL (ref 140–400)
RBC: 3.75 MIL/uL — ABNORMAL LOW (ref 4.20–5.80)
RDW: 17.1 % — ABNORMAL HIGH (ref 11.0–15.0)
WBC: 5.8 10*3/uL (ref 3.8–10.8)

## 2016-06-03 LAB — URINALYSIS, MICROSCOPIC ONLY
BACTERIA UA: NONE SEEN [HPF]
CASTS: NONE SEEN [LPF]
CRYSTALS: NONE SEEN [HPF]
RBC / HPF: NONE SEEN RBC/HPF (ref <=2)
SQUAMOUS EPITHELIAL / LPF: NONE SEEN [HPF] (ref <=5)
WBC UA: NONE SEEN WBC/HPF (ref <=5)
Yeast: NONE SEEN [HPF]

## 2016-06-03 LAB — MAGNESIUM: MAGNESIUM: 1.8 mg/dL (ref 1.5–2.5)

## 2016-06-03 LAB — URINALYSIS, ROUTINE W REFLEX MICROSCOPIC
Bilirubin Urine: NEGATIVE
Glucose, UA: NEGATIVE
Hgb urine dipstick: NEGATIVE
KETONES UR: NEGATIVE
Leukocytes, UA: NEGATIVE
NITRITE: NEGATIVE
PH: 5 (ref 5.0–8.0)
SPECIFIC GRAVITY, URINE: 1.013 (ref 1.001–1.035)

## 2016-06-03 LAB — INSULIN, RANDOM: Insulin: 30.8 u[IU]/mL — ABNORMAL HIGH (ref 2.0–19.6)

## 2016-06-03 LAB — BASIC METABOLIC PANEL WITH GFR
BUN: 28 mg/dL — AB (ref 7–25)
CALCIUM: 9.1 mg/dL (ref 8.6–10.3)
CHLORIDE: 99 mmol/L (ref 98–110)
CO2: 26 mmol/L (ref 20–31)
CREATININE: 2.39 mg/dL — AB (ref 0.70–1.18)
GFR, Est African American: 30 mL/min — ABNORMAL LOW (ref >=60)
GFR, Est Non African American: 26 mL/min — ABNORMAL LOW (ref >=60)
GLUCOSE: 104 mg/dL — AB (ref 65–99)
Potassium: 3.7 mmol/L (ref 3.5–5.3)
Sodium: 135 mmol/L (ref 135–146)

## 2016-06-03 LAB — HEPATIC FUNCTION PANEL
ALT: 23 U/L (ref 9–46)
AST: 34 U/L (ref 10–35)
Albumin: 4 g/dL (ref 3.6–5.1)
Alkaline Phosphatase: 64 U/L (ref 40–115)
Bilirubin, Direct: 0.4 mg/dL — ABNORMAL HIGH (ref <=0.2)
Indirect Bilirubin: 0.5 mg/dL (ref 0.2–1.2)
TOTAL PROTEIN: 7.5 g/dL (ref 6.1–8.1)
Total Bilirubin: 0.9 mg/dL (ref 0.2–1.2)

## 2016-06-03 LAB — VITAMIN D 25 HYDROXY (VIT D DEFICIENCY, FRACTURES): VIT D 25 HYDROXY: 53 ng/mL (ref 30–100)

## 2016-06-03 LAB — PSA: PSA: 0.8 ng/mL (ref <=4.0)

## 2016-06-03 LAB — MICROALBUMIN / CREATININE URINE RATIO
CREATININE, URINE: 116 mg/dL (ref 20–370)
Microalb Creat Ratio: 189 mcg/mg creat — ABNORMAL HIGH (ref <30)
Microalb, Ur: 21.9 mg/dL

## 2016-06-03 LAB — TSH: TSH: 6.56 m[IU]/L — AB (ref 0.40–4.50)

## 2016-06-03 LAB — HEMOGLOBIN A1C
HEMOGLOBIN A1C: 5.4 % (ref <5.7)
MEAN PLASMA GLUCOSE: 108 mg/dL

## 2016-06-03 LAB — URIC ACID: URIC ACID, SERUM: 7.4 mg/dL (ref 4.0–8.0)

## 2016-06-03 MED ORDER — SODIUM CHLORIDE 0.9 % IV SOLN
510.0000 mg | INTRAVENOUS | Status: DC
  Administered 2016-06-03: 510 mg via INTRAVENOUS
  Filled 2016-06-03: qty 17

## 2016-06-15 DIAGNOSIS — H35013 Changes in retinal vascular appearance, bilateral: Secondary | ICD-10-CM | POA: Diagnosis not present

## 2016-06-15 DIAGNOSIS — H5202 Hypermetropia, left eye: Secondary | ICD-10-CM | POA: Diagnosis not present

## 2016-06-22 DIAGNOSIS — G4733 Obstructive sleep apnea (adult) (pediatric): Secondary | ICD-10-CM | POA: Diagnosis not present

## 2016-06-28 DIAGNOSIS — N200 Calculus of kidney: Secondary | ICD-10-CM | POA: Diagnosis not present

## 2016-06-28 DIAGNOSIS — N400 Enlarged prostate without lower urinary tract symptoms: Secondary | ICD-10-CM | POA: Diagnosis not present

## 2016-07-05 ENCOUNTER — Ambulatory Visit (INDEPENDENT_AMBULATORY_CARE_PROVIDER_SITE_OTHER): Payer: PPO | Admitting: Internal Medicine

## 2016-07-05 ENCOUNTER — Encounter: Payer: Self-pay | Admitting: Internal Medicine

## 2016-07-05 VITALS — BP 128/60 | HR 60 | Temp 98.2°F | Resp 18 | Ht 67.0 in | Wt 228.0 lb

## 2016-07-05 DIAGNOSIS — Z7901 Long term (current) use of anticoagulants: Secondary | ICD-10-CM

## 2016-07-05 DIAGNOSIS — I482 Chronic atrial fibrillation: Secondary | ICD-10-CM

## 2016-07-05 DIAGNOSIS — Z23 Encounter for immunization: Secondary | ICD-10-CM

## 2016-07-05 DIAGNOSIS — W19XXXA Unspecified fall, initial encounter: Secondary | ICD-10-CM

## 2016-07-05 DIAGNOSIS — I4821 Permanent atrial fibrillation: Secondary | ICD-10-CM

## 2016-07-05 LAB — PROTIME-INR
INR: 3.1 — AB
Prothrombin Time: 31.3 s — ABNORMAL HIGH (ref 9.0–11.5)

## 2016-07-05 MED ORDER — TRAMADOL HCL 50 MG PO TABS: 50.0000 mg | ORAL_TABLET | Freq: Three times a day (TID) | ORAL | 0 refills | Status: DC | PRN

## 2016-07-05 NOTE — Progress Notes (Signed)
Assessment and Plan:   1. Long-term (current) use of anticoagulants -cont coumadin -dose adjust if necessary - Protime-INR  2. Permanent atrial fibrillation (HCC) -cont rate control -cont couamdin  3.  Fall on coumadin -patient and son aware of the risks of brain bleeding with falls and head trauma at this time they are declining a head CT but do agree to get one if changes in neuro status -neurological exam is normal -no red flags -tramadol as needed for hip pain and for headache -patient has no history of seizure -if changes in baseline mental status, somnolence or vision changes patient will need CT head emergently   4. Need for prophylactic vaccination and inoculation against influenza  - Flu vaccine HIGH DOSE PF (Fluzone High dose)     HPI 75 y.o.male presents for 1 month follow up of coumadin for long term use of atrial fibrillation and also artificial valve.  He reports that he did fall last night.  He has had a headache.  He did not have any vision changes or loss of consciousness.  He reports that he also hit his left hip.  He reports that they did speak with Dr. Melford Aase last night.  Dr. Melford Aase told them that as long as he was at his baseline they didn't need to worry much about.  Patient reports that they have been doing well.  He is taking his medications daily.   He does not miss doses.  He has not been on any antibiotics recently.    male is taking their medication.  They are not having difficulty with their medications.  They report no adverse reactions.  He denies melena, hematochezia, nose bleeds.  He does have a sore spot on his left hip from falling in the shower, but is able to walk on it without much difficulty.  He reports that he has had his eye exam and has also had his flu shot.    Past Medical History:  Diagnosis Date  . AAA (abdominal aortic aneurysm) ([place])   . CAD (coronary artery disease)    a. CABG '00. b. all SVGs occluded 2010. c. low risk Nuc 2012    . Cataracts, bilateral   . Chronic anticoagulation   . CKD (chronic kidney disease), stage III   . COPD (chronic obstructive pulmonary disease) ([place])   . GERD (gastroesophageal reflux disease)   . Gout   . Hiatal hernia   . History of Doppler ultrasound 04/05/2013   LEAs; small distal abd. aoritc aneuysm is stable; fem-pop graft to R leg has 50-69% blockage  . History of echocardiogram 04/17/2013   EF 40-45%; LV hypertrophy; LV mild-mod dilated; AV regurg.; LA severely diated, RA mildly dilated  . History of nuclear stress test 05/14/2011   Dipyridamole; moderate perfusion defect in Basal Infrioer & Mid Inferior region - consistent w/infarct or scar; global LV systolic function mildly reduced; EKG negative for ischemia; low risk scan  . Hyperlipidemia   . Hypertension   . Ischemic cardiomyopathy   . Kidney cysts   . Mechanical heart valve present    a. Hx mechanical St Jude AVR 2000.  [name] OSA (obstructive sleep apnea)   . Peripheral arterial disease ([place])    a. s/p aorto bifem BG;  b. 11/2013 PVA distal R Fem-fem stenosis;  c. 12/2013 PTA of dist R femoral bypass graft.  . Permanent atrial fibrillation ([place])   . Prediabetes   . Ventricular tachycardia (paroxysmal) ([place])    Seen by Dr. Lovena Le  with EP, started on amiodarone  . Vitamin B deficiency      Allergies  Allergen Reactions  . Chantix [Varenicline] Nausea And Vomiting  . Lyrica [Pregabalin]     Swelling, couldn't breathe  . Nsaids Other (See Comments)    Unknown   . Simvastatin     Pt said he had a "bleed out" after taking it  . Ziac [Bisoprolol-Hydrochlorothiazide]     fatigue      Current Outpatient Prescriptions on File Prior to Visit  Medication Sig Dispense Refill  . albuterol (PROVENTIL) (2.5 MG/3ML) 0.083% nebulizer solution Take 2.5 mg by nebulization every 6 (six) hours as needed for wheezing or shortness of breath.    . allopurinol (ZYLOPRIM) 300 MG tablet TAKE 1 TABLET BY MOUTH DAILY. TO PREVENT GOUT 90 tablet  1  . amiodarone (PACERONE) 200 MG tablet TAKE 1 TABLET (200 MG TOTAL) BY MOUTH DAILY. 30 tablet 6  . aspirin 81 MG tablet Take 81 mg by mouth daily.    . bumetanide (BUMEX) 2 MG tablet Take 1/2 tablet daily as needed for swelling 30 tablet 6  . Cholecalciferol (VITAMIN D PO) Take 5,000 Units by mouth daily.     . Cyanocobalamin (VITAMIN B 12 PO) Take 1,500 mcg by mouth daily.     . fenofibrate (TRICOR) 145 MG tablet TAKE 1 TABLET DAILY FOR BLOOD FATS 90 tablet 3  . ferrous sulfate 325 (65 FE) MG tablet Take 325 mg by mouth daily.    [name] HYDROcodone-acetaminophen (NORCO) 5-325 MG tablet Take 1 tablet by mouth every 6 (six) hours as needed. 40 tablet 0  . ipratropium-albuterol (DUONEB) 0.5-2.5 (3) MG/3ML SOLN Inhale 3 mLs into the lungs every 6 (six) hours as needed (shortness of breath).     . isosorbide mononitrate (IMDUR) 30 MG 24 hr tablet TAKE 1 TABLET BY MOUTH EVERY DAY 90 tablet 1  . levothyroxine (SYNTHROID, LEVOTHROID) 50 MCG tablet TAKE 1 TABLET BY MOUTH EVERY DAY 90 tablet 1  . montelukast (SINGULAIR) 10 MG tablet TAKE 1 TABLET (10 MG TOTAL) BY MOUTH DAILY. 30 tablet 2  . NITROSTAT 0.4 MG SL tablet PLACE 1 TABLET (0.4 MG TOTAL) UNDER THE TONGUE EVERY 5 (FIVE) MINUTES AS NEEDED FOR CHEST PAIN. 25 tablet 5  . pravastatin (PRAVACHOL) 40 MG tablet TAKE 1 TABLET (40 MG TOTAL) BY MOUTH EVERY MORNING. 90 tablet 1  . ranitidine (ZANTAC) 300 MG tablet TAKE 1 TABLET (300 MG TOTAL) BY MOUTH AT BEDTIME. 90 tablet 1  . sertraline (ZOLOFT) 50 MG tablet TAKE 1 TABLET (50 MG TOTAL) BY MOUTH DAILY. 90 tablet 1  . tiotropium (SPIRIVA) 18 MCG inhalation capsule Place 18 mcg into inhaler and inhale daily as needed (for shortness of breath).     . verapamil (CALAN) 80 MG tablet TAKE 1 TABLET (80 MG TOTAL) BY MOUTH 2 (TWO) TIMES DAILY. 180 tablet 1  . warfarin (COUMADIN) 5 MG tablet Take 1-1.5 tablets (5-7.5 mg total) by mouth daily. Take 7.5 mg tonight then 5 mg daily thereafter until you follow up with your  regular doctor who can adjust med further based on your blood test. 90 tablet 3   No current facility-administered medications on file prior to visit.    Review of Systems  Constitutional: Negative for chills, fever and malaise/fatigue.  HENT: Negative for hearing loss.   Eyes: Negative for blurred vision, double vision and photophobia.  Cardiovascular: Negative for chest pain, palpitations and leg swelling.  Musculoskeletal: Positive for myalgias.  Neurological:  Positive for headaches. Negative for dizziness, sensory change, speech change, focal weakness and loss of consciousness.    Physical Exam: Filed Weights   07/05/16 1036  Weight: 228 lb (103.4 kg)   BP 128/60   Pulse 60   Temp 98.2 F (36.8 C) (Temporal)   Resp 18   Ht 5\' 7"  (1.702 m)   Wt 228 lb (103.4 kg)   SpO2 98%   BMI 35.71 kg/m  General Appearance: Well developed well nourished, non-toxic appearing in no apparent distress. Eyes: PERRLA, EOMs, conjunctiva w/ no swelling or erythema or discharge Sinuses: No Frontal/maxillary tenderness ENT/Mouth: Ear canals clear without swelling or erythema.  TM's normal bilaterally with no retractions, bulging, or loss of landmarks.   Neck: Supple, thyroid normal, no notable JVD  Respiratory: Respiratory effort normal, Clear breath sounds anteriorly and posteriorly bilaterally without rales, rhonchi, wheezing or stridor. No retractions or accessory muscle usage. Cardio: Ireg ireg with no prominent midsystolic click heard best on LSB.  NO RGs.   Abdomen: Soft, + BS.  Non tender, no guarding, rebound, hernias, masses.  Musculoskeletal: Full ROM, 5/5 strength, normal gait. There is a large firm hematoma to the left hip.   Skin: Warm, dry without rashes  Neuro: Awake and oriented X 3, Cranial nerves intact. Normal muscle tone, no cerebellar symptoms. Sensation intact.  Psych: normal affect, Insight and Judgment appropriate.     [name], PA-C 11:08 AM [facility]

## 2016-07-09 ENCOUNTER — Ambulatory Visit: Payer: PPO | Admitting: Podiatry

## 2016-07-22 DIAGNOSIS — G4733 Obstructive sleep apnea (adult) (pediatric): Secondary | ICD-10-CM | POA: Diagnosis not present

## 2016-07-29 ENCOUNTER — Other Ambulatory Visit: Payer: Self-pay | Admitting: *Deleted

## 2016-07-29 MED ORDER — WARFARIN SODIUM 5 MG PO TABS: ORAL_TABLET | ORAL | 3 refills | Status: DC

## 2016-07-29 MED ORDER — WARFARIN SODIUM 5 MG PO TABS: 5.0000 mg | ORAL_TABLET | Freq: Every day | ORAL | 3 refills | Status: DC

## 2016-08-05 ENCOUNTER — Encounter: Payer: Self-pay | Admitting: Physician Assistant

## 2016-08-05 ENCOUNTER — Ambulatory Visit (INDEPENDENT_AMBULATORY_CARE_PROVIDER_SITE_OTHER): Payer: PPO | Admitting: Physician Assistant

## 2016-08-05 VITALS — BP 132/66 | HR 71 | Temp 97.0°F | Resp 16 | Ht 67.0 in | Wt 231.2 lb

## 2016-08-05 DIAGNOSIS — E1122 Type 2 diabetes mellitus with diabetic chronic kidney disease: Secondary | ICD-10-CM

## 2016-08-05 DIAGNOSIS — J449 Chronic obstructive pulmonary disease, unspecified: Secondary | ICD-10-CM | POA: Diagnosis not present

## 2016-08-05 DIAGNOSIS — N184 Chronic kidney disease, stage 4 (severe): Secondary | ICD-10-CM

## 2016-08-05 DIAGNOSIS — I714 Abdominal aortic aneurysm, without rupture, unspecified: Secondary | ICD-10-CM

## 2016-08-05 DIAGNOSIS — I129 Hypertensive chronic kidney disease with stage 1 through stage 4 chronic kidney disease, or unspecified chronic kidney disease: Secondary | ICD-10-CM

## 2016-08-05 DIAGNOSIS — F17200 Nicotine dependence, unspecified, uncomplicated: Secondary | ICD-10-CM

## 2016-08-05 DIAGNOSIS — I739 Peripheral vascular disease, unspecified: Secondary | ICD-10-CM

## 2016-08-05 DIAGNOSIS — I480 Paroxysmal atrial fibrillation: Secondary | ICD-10-CM

## 2016-08-05 LAB — CBC WITH DIFFERENTIAL/PLATELET
BASOS PCT: 1 %
Basophils Absolute: 55 cells/uL (ref 0–200)
Eosinophils Absolute: 110 cells/uL (ref 15–500)
Eosinophils Relative: 2 %
HEMATOCRIT: 33.5 % — AB (ref 38.5–50.0)
Hemoglobin: 10.8 g/dL — ABNORMAL LOW (ref 13.2–17.1)
LYMPHS ABS: 1045 {cells}/uL (ref 850–3900)
LYMPHS PCT: 19 %
MCH: 31 pg (ref 27.0–33.0)
MCHC: 32.2 g/dL (ref 32.0–36.0)
MCV: 96.3 fL (ref 80.0–100.0)
MONO ABS: 495 {cells}/uL (ref 200–950)
MPV: 10.7 fL (ref 7.5–12.5)
Monocytes Relative: 9 %
NEUTROS ABS: 3795 {cells}/uL (ref 1500–7800)
Neutrophils Relative %: 69 %
Platelets: 226 10*3/uL (ref 140–400)
RBC: 3.48 MIL/uL — AB (ref 4.20–5.80)
RDW: 16.4 % — AB (ref 11.0–15.0)
WBC: 5.5 10*3/uL (ref 3.8–10.8)

## 2016-08-05 NOTE — Progress Notes (Signed)
Assessment and Plan: 1. 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 - BASIC METABOLIC PANEL WITH GFR  2. Permanent atrial fibrillation (HCC) Rate controlled, continue coumadin, GOAL 2.5-3.5 - Protime-INR - CBC with Differential/Platelet  3. Peripheral arterial disease (HCC) Control blood pressure, cholesterol, glucose, increase exercise.  Advised to quit smoking  4. Abdominal aortic aneurysm (AAA) without rupture (HCC) Control blood pressure, cholesterol, glucose, increase exercise.  Advised to quit smoking  5. Chronic obstructive pulmonary disease, unspecified COPD type ([place]) Advised to stop smoking, continue meds.   6. Type 2 diabetes mellitus with stage 4 chronic kidney disease, without long-term current use of insulin ([place]) Discussed general issues about diabetes pathophysiology and management., Educational material distributed., Suggested low cholesterol diet., Encouraged aerobic exercise., Discussed foot care., Reminded to get yearly retinal exam.  7. Tobacco use disorder Patient is not interested at this time.   Future Appointments Date Time Provider [place]  09/08/2016 2:45 PM Unk Pinto, MD [facility] None  09/30/2016 11:00 AM [name], PA-C [facility] None  07/06/2017 10:00 AM Unk Pinto, MD [facility] None    HPI 75 y.o.male presents for 1 month follow up of long term coumadin use for chronic atrial fibrillation. Patient reports that they have been doing well.  He is not having severe, bruising, bleeding, epistaxis, melena, or hematochezia.  He does take his coumadin daily.  He is on 2.5 mg 4 days a week and 5 mg 3 days a week.  He had a fall 6 weeks ago and then fell again 2 weeks ago in the yard, he was  sitting on bucket in the yard, skinned up his elbow and hit his back/butt but no LOC, did not hit his head.  He has CHF, his weight is stable, SOB is at baseline, denies any recent CP, no NTG x 4 weeks.  Lab Results  Component Value Date   INR 3.1 (H) 07/05/2016   INR 1.6 (H) 06/02/2016   INR 2.6 (H) 05/21/2016    Wt Readings from Last 3 Encounters:  08/05/16 231 lb 3.2 oz (104.9 kg)  07/05/16 228 lb (103.4 kg)  06/03/16 230 lb (104.3 kg)    Past Medical History:  Diagnosis Date  . AAA (abdominal aortic aneurysm) ([place])   . CAD (coronary artery disease)    a. CABG '00. b. all SVGs occluded 2010. c. low risk Nuc 2012   . Cataracts, bilateral   . Chronic anticoagulation   . CKD (chronic kidney disease), stage III   . COPD (chronic obstructive pulmonary disease) ([place])   . GERD (gastroesophageal reflux disease)   . Gout   . Hiatal hernia   . History of Doppler ultrasound 04/05/2013   LEAs; small distal abd. aoritc aneuysm is stable; fem-pop graft to R leg has 50-69% blockage  . History of echocardiogram 04/17/2013   EF 40-45%; LV hypertrophy; LV mild-mod dilated; AV regurg.; LA severely diated, RA mildly dilated  . History of nuclear stress test 05/14/2011   Dipyridamole; moderate perfusion defect in Basal Infrioer & Mid Inferior region - consistent w/infarct or scar; global LV systolic function mildly reduced; EKG negative for ischemia; low risk scan  . Hyperlipidemia   . Hypertension   . Ischemic cardiomyopathy   . Kidney cysts   . Mechanical heart valve present    a. Hx mechanical St  Jude AVR 2000.  [name] OSA (obstructive sleep apnea)   . Peripheral arterial disease ([place])    a. s/p aorto bifem BG;  b. 11/2013 PVA distal R Fem-fem stenosis;  c. 12/2013 PTA of dist R femoral bypass graft.  . Permanent atrial fibrillation ([place])   . Prediabetes   . Ventricular tachycardia (paroxysmal) ([place])    Seen by Dr. Lovena Le with EP, started on amiodarone  . Vitamin B deficiency      Allergies   Allergen Reactions  . Chantix [Varenicline] Nausea And Vomiting  . Lyrica [Pregabalin]     Swelling, couldn't breathe  . Nsaids Other (See Comments)    Unknown   . Simvastatin     Pt said he had a "bleed out" after taking it  . Ziac [Bisoprolol-Hydrochlorothiazide]     fatigue      Current Outpatient Prescriptions on File Prior to Visit  Medication Sig Dispense Refill  . albuterol (PROVENTIL) (2.5 MG/3ML) 0.083% nebulizer solution Take 2.5 mg by nebulization every 6 (six) hours as needed for wheezing or shortness of breath.    . allopurinol (ZYLOPRIM) 300 MG tablet TAKE 1 TABLET BY MOUTH DAILY. TO PREVENT GOUT 90 tablet 1  . amiodarone (PACERONE) 200 MG tablet TAKE 1 TABLET (200 MG TOTAL) BY MOUTH DAILY. 30 tablet 6  . aspirin 81 MG tablet Take 81 mg by mouth daily.    . bumetanide (BUMEX) 2 MG tablet Take 1/2 tablet daily as needed for swelling 30 tablet 6  . Cholecalciferol (VITAMIN D PO) Take 5,000 Units by mouth daily.     . Cyanocobalamin (VITAMIN B 12 PO) Take 1,500 mcg by mouth daily.     . fenofibrate (TRICOR) 145 MG tablet TAKE 1 TABLET DAILY FOR BLOOD FATS 90 tablet 3  . ferrous sulfate 325 (65 FE) MG tablet Take 325 mg by mouth daily.    [name] HYDROcodone-acetaminophen (NORCO) 5-325 MG tablet Take 1 tablet by mouth every 6 (six) hours as needed. 40 tablet 0  . ipratropium-albuterol (DUONEB) 0.5-2.5 (3) MG/3ML SOLN Inhale 3 mLs into the lungs every 6 (six) hours as needed (shortness of breath).     . isosorbide mononitrate (IMDUR) 30 MG 24 hr tablet TAKE 1 TABLET BY MOUTH EVERY DAY 90 tablet 1  . levothyroxine (SYNTHROID, LEVOTHROID) 50 MCG tablet TAKE 1 TABLET BY MOUTH EVERY DAY 90 tablet 1  . montelukast (SINGULAIR) 10 MG tablet TAKE 1 TABLET (10 MG TOTAL) BY MOUTH DAILY. 30 tablet 2  . NITROSTAT 0.4 MG SL tablet PLACE 1 TABLET (0.4 MG TOTAL) UNDER THE TONGUE EVERY 5 (FIVE) MINUTES AS NEEDED FOR CHEST PAIN. 25 tablet 5  . pravastatin (PRAVACHOL) 40 MG tablet TAKE 1 TABLET (40  MG TOTAL) BY MOUTH EVERY MORNING. 90 tablet 1  . ranitidine (ZANTAC) 300 MG tablet TAKE 1 TABLET (300 MG TOTAL) BY MOUTH AT BEDTIME. 90 tablet 1  . sertraline (ZOLOFT) 50 MG tablet TAKE 1 TABLET (50 MG TOTAL) BY MOUTH DAILY. 90 tablet 1  . tiotropium (SPIRIVA) 18 MCG inhalation capsule Place 18 mcg into inhaler and inhale daily as needed (for shortness of breath).     . traMADol (ULTRAM) 50 MG tablet Take 1 tablet (50 mg total) by mouth every 8 (eight) hours as needed. 30 tablet 0  . verapamil (CALAN) 80 MG tablet TAKE 1 TABLET (80 MG TOTAL) BY MOUTH 2 (TWO) TIMES DAILY. 180 tablet 1  . warfarin (COUMADIN) 5 MG tablet Take 1 tablet daily or as directed by  your doctor. 90 tablet 3   No current facility-administered medications on file prior to visit.     ROS: all negative except above.   Physical Exam: Filed Weights   08/05/16 1038  Weight: 231 lb 3.2 oz (104.9 kg)   BP 132/66   Pulse 71   Temp 97 F (36.1 C)   Resp 16   Ht 5\' 7"  (1.702 m)   Wt 231 lb 3.2 oz (104.9 kg)   SpO2 98%   BMI 36.21 kg/m  General Appearance: Well developed well nourished, non-toxic appearing in no apparent distress. Eyes: PERRLA, EOMs, conjunctiva w/ no swelling or erythema or discharge Sinuses: No Frontal/maxillary tenderness ENT/Mouth: Ear canals clear without swelling or erythema.  TM's normal bilaterally with no retractions, bulging, or loss of landmarks., decreased hearing Neck: Supple, thyroid normal, no notable JVD  Respiratory: Respiratory effort mildly increased, Clear but distant breath sounds anteriorly and posteriorly bilaterally without rales, rhonchi, or stridor.  There is the occasional end expiratory wheeze. No retractions or accessory muscle usage. Cardio: Ireg ireg with systolic mechanical click RSB Abdomen: Soft, + BS.  Non tender, no guarding, rebound, hernias, masses.  Musculoskeletal: Full ROM, 5/5 strength, normal gait.  Skin: Warm, dry without rashes, healing hematoma 2x4 inches  left lower back Neuro: Awake and oriented X 3, Cranial nerves intact. Normal muscle tone, no cerebellar symptoms. S Psych: normal affect, Insight and Judgment appropriate.     Vicie Mutters, PA-C 10:43 AM [facility]

## 2016-08-05 NOTE — Patient Instructions (Signed)
Fall Prevention in the Home  Falls can cause injuries and can affect people from all age groups. There are many simple things that you can do to make your home safe and to help prevent falls. WHAT CAN I DO ON THE OUTSIDE OF MY HOME?  Regularly repair the edges of walkways and driveways and fix any cracks.  Remove high doorway thresholds.  Trim any shrubbery on the main path into your home.  Use bright outdoor lighting.  Clear walkways of debris and clutter, including tools and rocks.  Regularly check that handrails are securely fastened and in good repair. Both sides of any steps should have handrails.  Install guardrails along the edges of any raised decks or porches.  Have leaves, snow, and ice cleared regularly.  Use sand or salt on walkways during winter months.  In the garage, clean up any spills right away, including grease or oil spills. WHAT CAN I DO IN THE BATHROOM?  Use night lights.  Install grab bars by the toilet and in the tub and shower. Do not use towel bars as grab bars.  Use non-skid mats or decals on the floor of the tub or shower.  If you need to sit down while you are in the shower, use a plastic, non-slip stool..  Keep the floor dry. Immediately clean up any water that spills on the floor.  Remove soap buildup in the tub or shower on a regular basis.  Attach bath mats securely with double-sided non-slip rug tape.  Remove throw rugs and other tripping hazards from the floor. WHAT CAN I DO IN THE BEDROOM?  Use night lights.  Make sure that a bedside light is easy to reach.  Do not use oversized bedding that drapes onto the floor.  Have a firm chair that has side arms to use for getting dressed.  Remove throw rugs and other tripping hazards from the floor. WHAT CAN I DO IN THE KITCHEN?   Clean up any spills right away.  Avoid walking on wet floors.  Place frequently used items in easy-to-reach places.  If you need to reach for something  above you, use a sturdy step stool that has a grab bar.  Keep electrical cables out of the way.  Do not use floor polish or wax that makes floors slippery. If you have to use wax, make sure that it is non-skid floor wax.  Remove throw rugs and other tripping hazards from the floor. WHAT CAN I DO IN THE STAIRWAYS?  Do not leave any items on the stairs.  Make sure that there are handrails on both sides of the stairs. Fix handrails that are broken or loose. Make sure that handrails are as long as the stairways.  Check any carpeting to make sure that it is firmly attached to the stairs. Fix any carpet that is loose or worn.  Avoid having throw rugs at the top or bottom of stairways, or secure the rugs with carpet tape to prevent them from moving.  Make sure that you have a light switch at the top of the stairs and the bottom of the stairs. If you do not have them, have them installed. WHAT ARE SOME OTHER FALL PREVENTION TIPS?  Wear closed-toe shoes that fit well and support your feet. Wear shoes that have rubber soles or low heels.  When you use a stepladder, make sure that it is completely opened and that the sides are firmly locked. Have someone hold the ladder while you   are using it. Do not climb a closed stepladder.  Add color or contrast paint or tape to grab bars and handrails in your home. Place contrasting color strips on the first and last steps.  Use mobility aids as needed, such as canes, walkers, scooters, and crutches.  Turn on lights if it is dark. Replace any light bulbs that burn out.  Set up furniture so that there are clear paths. Keep the furniture in the same spot.  Fix any uneven floor surfaces.  Choose a carpet design that does not hide the edge of steps of a stairway.  Be aware of any and all pets.  Review your medicines with your healthcare provider. Some medicines can cause dizziness or changes in blood pressure, which increase your risk of falling. Talk  with your health care provider about other ways that you can decrease your risk of falls. This may include working with a physical therapist or trainer to improve your strength, balance, and endurance.   This information is not intended to replace advice given to you by your health care provider. Make sure you discuss any questions you have with your health care provider.   Document Released: 09/24/2002 Document Revised: 02/18/2015 Document Reviewed: 11/08/2014 [employer] Interactive Patient Education 2016 [employer]  

## 2016-08-06 LAB — BASIC METABOLIC PANEL WITH GFR
BUN: 28 mg/dL — ABNORMAL HIGH (ref 7–25)
CO2: 27 mmol/L (ref 20–31)
Calcium: 8.8 mg/dL (ref 8.6–10.3)
Chloride: 103 mmol/L (ref 98–110)
Creat: 2.64 mg/dL — ABNORMAL HIGH (ref 0.70–1.18)
GFR, EST AFRICAN AMERICAN: 27 mL/min — AB (ref >=60)
GFR, EST NON AFRICAN AMERICAN: 23 mL/min — AB (ref >=60)
GLUCOSE: 136 mg/dL — AB (ref 65–99)
POTASSIUM: 4.2 mmol/L (ref 3.5–5.3)
Sodium: 138 mmol/L (ref 135–146)

## 2016-08-06 LAB — PROTIME-INR
INR: 1.9 — ABNORMAL HIGH
PROTHROMBIN TIME: 19.2 s — AB (ref 9.0–11.5)

## 2016-08-11 ENCOUNTER — Other Ambulatory Visit: Payer: Self-pay | Admitting: Internal Medicine

## 2016-08-13 ENCOUNTER — Other Ambulatory Visit: Payer: Self-pay | Admitting: Internal Medicine

## 2016-08-17 ENCOUNTER — Other Ambulatory Visit: Payer: Self-pay | Admitting: Internal Medicine

## 2016-08-22 DIAGNOSIS — G4733 Obstructive sleep apnea (adult) (pediatric): Secondary | ICD-10-CM | POA: Diagnosis not present

## 2016-08-27 ENCOUNTER — Other Ambulatory Visit: Payer: Self-pay | Admitting: *Deleted

## 2016-08-27 DIAGNOSIS — Z1211 Encounter for screening for malignant neoplasm of colon: Secondary | ICD-10-CM

## 2016-08-27 LAB — POC HEMOCCULT BLD/STL (HOME/3-CARD/SCREEN)
Card #3 Fecal Occult Blood, POC: NEGATIVE
FECAL OCCULT BLD: NEGATIVE
Fecal Occult Blood, POC: NEGATIVE

## 2016-09-08 ENCOUNTER — Ambulatory Visit (INDEPENDENT_AMBULATORY_CARE_PROVIDER_SITE_OTHER): Payer: PPO | Admitting: Internal Medicine

## 2016-09-08 VITALS — BP 122/64 | HR 60 | Temp 97.3°F | Resp 16 | Ht 67.0 in | Wt 224.2 lb

## 2016-09-08 DIAGNOSIS — Z7901 Long term (current) use of anticoagulants: Secondary | ICD-10-CM | POA: Diagnosis not present

## 2016-09-08 DIAGNOSIS — E1122 Type 2 diabetes mellitus with diabetic chronic kidney disease: Secondary | ICD-10-CM | POA: Diagnosis not present

## 2016-09-08 DIAGNOSIS — I482 Chronic atrial fibrillation, unspecified: Secondary | ICD-10-CM

## 2016-09-08 DIAGNOSIS — N184 Chronic kidney disease, stage 4 (severe): Secondary | ICD-10-CM

## 2016-09-08 DIAGNOSIS — E782 Mixed hyperlipidemia: Secondary | ICD-10-CM

## 2016-09-08 DIAGNOSIS — M1000 Idiopathic gout, unspecified site: Secondary | ICD-10-CM

## 2016-09-08 DIAGNOSIS — Z79899 Other long term (current) drug therapy: Secondary | ICD-10-CM | POA: Diagnosis not present

## 2016-09-08 DIAGNOSIS — E559 Vitamin D deficiency, unspecified: Secondary | ICD-10-CM

## 2016-09-08 DIAGNOSIS — I10 Essential (primary) hypertension: Secondary | ICD-10-CM | POA: Diagnosis not present

## 2016-09-08 DIAGNOSIS — J449 Chronic obstructive pulmonary disease, unspecified: Secondary | ICD-10-CM | POA: Diagnosis not present

## 2016-09-08 LAB — HEPATIC FUNCTION PANEL
ALT: 24 U/L (ref 9–46)
AST: 48 U/L — AB (ref 10–35)
Albumin: 4 g/dL (ref 3.6–5.1)
Alkaline Phosphatase: 76 U/L (ref 40–115)
BILIRUBIN DIRECT: 0.4 mg/dL — AB (ref <=0.2)
BILIRUBIN TOTAL: 0.9 mg/dL (ref 0.2–1.2)
Indirect Bilirubin: 0.5 mg/dL (ref 0.2–1.2)
Total Protein: 8.1 g/dL (ref 6.1–8.1)

## 2016-09-08 LAB — CBC WITH DIFFERENTIAL/PLATELET
BASOS PCT: 1 %
Basophils Absolute: 54 cells/uL (ref 0–200)
EOS PCT: 3 %
Eosinophils Absolute: 162 cells/uL (ref 15–500)
HCT: 36.9 % — ABNORMAL LOW (ref 38.5–50.0)
Hemoglobin: 11.5 g/dL — ABNORMAL LOW (ref 13.2–17.1)
Lymphocytes Relative: 21 %
Lymphs Abs: 1134 cells/uL (ref 850–3900)
MCH: 30.6 pg (ref 27.0–33.0)
MCHC: 31.2 g/dL — ABNORMAL LOW (ref 32.0–36.0)
MCV: 98.1 fL (ref 80.0–100.0)
MONOS PCT: 9 %
MPV: 10.7 fL (ref 7.5–12.5)
Monocytes Absolute: 486 cells/uL (ref 200–950)
NEUTROS ABS: 3564 {cells}/uL (ref 1500–7800)
Neutrophils Relative %: 66 %
PLATELETS: 225 10*3/uL (ref 140–400)
RBC: 3.76 MIL/uL — ABNORMAL LOW (ref 4.20–5.80)
RDW: 15.4 % — ABNORMAL HIGH (ref 11.0–15.0)
WBC: 5.4 10*3/uL (ref 3.8–10.8)

## 2016-09-08 LAB — BASIC METABOLIC PANEL WITH GFR
BUN: 40 mg/dL — ABNORMAL HIGH (ref 7–25)
CHLORIDE: 104 mmol/L (ref 98–110)
CO2: 28 mmol/L (ref 20–31)
CREATININE: 2.27 mg/dL — AB (ref 0.70–1.18)
Calcium: 9.2 mg/dL (ref 8.6–10.3)
GFR, Est African American: 32 mL/min — ABNORMAL LOW (ref >=60)
GFR, Est Non African American: 28 mL/min — ABNORMAL LOW (ref >=60)
Glucose, Bld: 95 mg/dL (ref 65–99)
POTASSIUM: 3.9 mmol/L (ref 3.5–5.3)
SODIUM: 142 mmol/L (ref 135–146)

## 2016-09-08 LAB — TSH: TSH: 5.29 m[IU]/L — AB (ref 0.40–4.50)

## 2016-09-08 LAB — LIPID PANEL
CHOL/HDL RATIO: 4.8 ratio (ref <5.0)
Cholesterol: 163 mg/dL (ref <200)
HDL: 34 mg/dL — AB (ref >40)
LDL Cholesterol: 104 mg/dL — ABNORMAL HIGH (ref <100)
TRIGLYCERIDES: 126 mg/dL (ref <150)
VLDL: 25 mg/dL (ref <30)

## 2016-09-08 LAB — URIC ACID: Uric Acid, Serum: 7.9 mg/dL (ref 4.0–8.0)

## 2016-09-08 LAB — HEMOGLOBIN A1C
Hgb A1c MFr Bld: 5.3 % (ref <5.7)
Mean Plasma Glucose: 105 mg/dL

## 2016-09-08 LAB — MAGNESIUM: MAGNESIUM: 2.1 mg/dL (ref 1.5–2.5)

## 2016-09-08 NOTE — Patient Instructions (Signed)

## 2016-09-08 NOTE — Progress Notes (Signed)
[facility] Unk Pinto, M.D.        [name]. [name], P.A.-C       [name], P.A.-C  [place] [address] [place] [id number] Telephone [phone] Telefax [phone] ______________________________________________________________________     This very nice  MWM presents for Quarterly  follow up with Hypertension, Hyperlipidemia, Pre-Diabetes and Vitamin D Deficiency. Also  he has OSA/CPAP. Patient has COPD and continues against recommends to smoke. Patient has hx/o Gout in remission on Allopurinol. He has been on Thyroid replacement since 2015.      Patient is treated for HTN (1980) & BP has been controlled at home who underwent CABG w/ St Jude's AoVR (2000). Pt's cardiologist is Dr [name].  Today's BP is  122/64.  Patient has hx/o Afib (2000) failing ablation. Patient also has hx/o ASCVD/TIAband ASPVD s/p ABiF BPG & R Bifem  PCA. Further, he has hx/o Renal Aa stenosis. Patient has had no complaints of any cardiac type chest pain, palpitations, dyspnea/orthopnea/PND, dizziness, claudication, or dependent edema.     Hyperlipidemia is controlled with diet & meds. Patient denies myalgias or other med SE's. 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  Last A1c was at goal: Lab Results  Component Value Date   HGBA1C 5.4 06/02/2016      Further, the patient also has history of Vitamin D Deficiency and supplements vitamin D without any suspected side-effects. Last vitamin D was still low (goal is 70-100):   Lab Results  Component Value Date   VD25OH 53 06/02/2016   Current Outpatient Prescriptions on File Prior to Visit  Medication Sig  . albuterol (PROVENTIL) (2.5 MG/3ML) 0.083% nebulizer solution Take 2.5 mg by nebulization every 6 (six) hours as needed for wheezing or shortness of breath.  . allopurinol (ZYLOPRIM) 300 MG tablet TAKE 1 TABLET BY MOUTH DAILY. TO PREVENT GOUT  . amiodarone (PACERONE) 200 MG tablet TAKE 1 TABLET (200 MG TOTAL) BY MOUTH DAILY.  [name] aspirin 81 MG tablet Take 81 mg by mouth daily.  . bumetanide (BUMEX) 2 MG tablet Take 1/2 tablet daily as needed for swelling  . Cholecalciferol (VITAMIN D PO) Take 5,000 Units by mouth daily.   . Cyanocobalamin (VITAMIN B 12 PO) Take 1,500 mcg by mouth daily.   . fenofibrate 145 MG tablet TAKE 1 TABLET DAILY FOR BLOOD FATS  . ferrous sulfate 325  Take 325 mg by mouth daily.  . NORCO 5-325 MG  Take 1 tab every 6 hrs as needed.  . DUONEB Inhale  every 6 hrs as needed   . IMDUR 30 MG 24 hr tablet TAKE 1 TAB EVERY DAY  . levothyroxine 50 MCG TAKE 1 TAB EVERY DAY  . montelukast  10 MG  TAKE 1 TAB DAILY.  [name] NITROSTAT 0.4 MG SL  AS NEEDED FOR CHEST PAIN.  [name] pravastatin  40  MG tablet TAKE 1 TAB EVERY MORNING.  . ranitidine 300 MG tablet TAKE 1 TABAT BEDTIME.  [name] sertraline 50 MG tablet TAKE 1 TAB DAILY.  [name] SPIRIVA capsule 2 x/ day  . traMADol  50 MG tablet Take 1 tab every 8  hours as needed.  . verapamil  80 MG tablet TAKE 1 TAB 2  TIMES DAILY.  [name] warfarin 5 MG tablet  1 tab x 4 da & 1/2 tab x 3 da   Allergies  Allergen Reactions  . Chantix [Varenicline] Nausea And Vomiting  . Lyrica [Pregabalin]     Swelling, couldn't breathe  . Nsaids Other (See Comments)    Unknown   . Simvastatin     Pt said he had a "bleed out" after taking it  . Ziac [Bisoprolol-Hydrochlorothiazide]     fatigue   PMHx:   Past Medical History:  Diagnosis Date  . AAA (abdominal aortic aneurysm) ([place])   . CAD (coronary artery disease)    a. CABG '00.  b. all SVGs occluded 2010. c. low risk Nuc 2012   . Cataracts, bilateral   . Chronic anticoagulation   . CKD (chronic kidney disease), stage III   . COPD (chronic obstructive pulmonary disease) ([place])   . GERD (gastroesophageal reflux disease)   . Gout   . Hiatal hernia   . History of Doppler ultrasound 04/05/2013   LEAs; small distal abd. aoritc aneuysm is stable; fem-pop graft to R leg has 50-69% blockage  . History of echocardiogram 04/17/2013   EF 40-45%; LV hypertrophy; LV mild-mod dilated; AV regurg.; LA severely diated, RA mildly dilated  . History of nuclear stress test 05/14/2011   Dipyridamole; moderate perfusion defect in Basal Infrioer & Mid Inferior region - consistent w/infarct or scar; global LV systolic function mildly reduced; EKG negative for ischemia; low risk scan  . Hyperlipidemia   . Hypertension   . Ischemic cardiomyopathy   . Kidney cysts   . Mechanical heart valve present    a. Hx mechanical St Jude AVR 2000.  [name] OSA (obstructive sleep apnea)   . Peripheral arterial disease ([place])    a. s/p aorto bifem BG;  b. 11/2013 PVA distal R Fem-fem stenosis;  c. 12/2013 PTA of dist R femoral bypass graft.  . Permanent atrial fibrillation ([place])   . Prediabetes   . Ventricular tachycardia (paroxysmal) ([place])    Seen by Dr. Lovena Le with EP, started on amiodarone  . Vitamin B deficiency    Immunization History  Administered Date(s) Administered  . DT 01/25/2012  . Influenza Split 06/28/2013  . Influenza, High Dose Seasonal PF 08/02/2014, 07/17/2015, 07/05/2016  . Pneumococcal Conjugate-13 03/25/2014  . Pneumococcal Polysaccharide-23 08/11/2012  . Zoster 10/30/2010   Past Surgical History:  Procedure Laterality Date  . AORTIC VALVE REPLACEMENT     St. Jude  . CARDIAC CATHETERIZATION  11/25/2008   loss of 2/3 (RCA, OM) bypass grafts with patent internal mammary artery to LAD; large collateral filling of RCA ; osteal narrowing of circumflex of ~50%  . CARDIOVERSION  8/282012   . CORONARY ARTERY BYPASS GRAFT  2000   3 vessel; LIMA to LAD; RCA, OM  . FEMORAL BYPASS    . LOWER EXTREMITY ANGIOGRAM N/A 12/03/2013   Procedure: LOWER EXTREMITY ANGIOGRAM;  Surgeon: Lorretta Harp, MD;  Location: [facility] CATH LAB;  Service: Cardiovascular;  Laterality: N/A;  . LOWER EXTREMITY ANGIOGRAM N/A 01/07/2014   Procedure: LOWER EXTREMITY ANGIOGRAM;  Surgeon: Lorretta Harp, MD;  Location:  [facility] CATH LAB;  Service: Cardiovascular;  Laterality: N/A;  . shunts in femoral arteries    . stents in kidneys     FHx:    Reviewed / unchanged  SHx:    Reviewed / unchanged  Systems Review:  Constitutional: Denies fever, chills, wt changes, headaches, insomnia, fatigue, night sweats, change in appetite. Eyes: Denies redness, blurred vision, diplopia, discharge, itchy, watery eyes.  ENT: Denies discharge, congestion, post nasal drip, epistaxis, sore throat, earache, hearing loss, dental pain, tinnitus, vertigo, sinus pain, snoring.  CV: Denies chest pain, palpitations, irregular heartbeat, syncope, dyspnea, diaphoresis, orthopnea, PND, claudication or edema. Respiratory: denies cough, dyspnea, DOE, pleurisy, hoarseness, laryngitis, wheezing.  Gastrointestinal: Denies dysphagia, odynophagia, heartburn, reflux, water brash, abdominal pain or cramps, nausea, vomiting, bloating, diarrhea, constipation, hematemesis, melena, hematochezia  or hemorrhoids. Genitourinary: Denies dysuria, frequency, urgency, nocturia, hesitancy, discharge, hematuria or flank pain. Musculoskeletal: Denies arthralgias, myalgias, stiffness, jt. swelling, pain, limping or strain/sprain.  Skin: Denies pruritus, rash, hives, warts, acne, eczema or change in skin lesion(s). Neuro: No weakness, tremor, incoordination, spasms, paresthesia or pain. Psychiatric: Denies confusion, memory loss or sensory loss. Endo: Denies change in weight, skin or hair change.  Heme/Lymph: No excessive bleeding, bruising or enlarged lymph  nodes.  Physical Exam BP 122/64   Pulse 60   Temp 97.3 F (36.3 C)   Resp 16   Ht 5\' 7"  (1.702 m)   Wt 224 lb 3.2 oz (101.7 kg)   BMI 35.11 kg/m   Appears well nourished and in no distress. Noted putrid tobacco fetor and BO.  Eyes: PERRLA, EOMs, conjunctiva no swelling or erythema. Sinuses: No frontal/maxillary tenderness ENT/Mouth: EAC's clear, TM's nl w/o erythema, bulging. Nares clear w/o erythema, swelling, exudates. Oropharynx clear without erythema or exudates. Oral hygiene is good. Tongue normal, non obstructing. Hearing intact.  Neck: Supple. Thyroid nl. Car 2+/2+ without bruits, nodes or JVD. Chest: Respirations nl with BS clear & equal w/o rales, rhonchi, wheezing or stridor.  Cor: Heart sounds distant w/ irreg  rate and rhythm with prosthetic valve clicks  without sig. Murmurs or gallops.Peripheral pulses normal and equal  without edema.  Abdomen: Soft & bowel sounds normal. Non-tender w/o guarding, rebound, hernias, masses, or organomegaly.  Lymphatics: Unremarkable.  Musculoskeletal: Full ROM all peripheral extremities, joint stability, 5/5 strength, and normal gait.  Skin: Warm, dry without exposed rashes, lesions or ecchymosis apparent.  Neuro: Cranial nerves intact, reflexes equal bilaterally. Sensory-motor testing grossly intact. Tendon reflexes grossly intact.  Pysch: Alert & oriented x 3.  Insight and judgement nl & appropriate. No ideations.  Assessment and Plan:  1. Essential hypertension  - Continue medication, monitor blood pressure at home.  - Continue DASH diet. Reminder to go to the ER if any CP,  SOB, nausea, dizziness, severe HA, changes vision/speech,  left arm numbness and tingling and jaw pain. - TSH  2. Hyperlipidemia  - Continue diet/meds, exercise,& lifestyle modifications.  - Continue monitor periodic cholesterol/liver & renal functions  - Lipid panel - TSH  3. Type 2 diabetes mellitus with stage 4 chronic kidney disease, without  long-term current use of insulin (HCC)  - Continue diet, exercise, lifestyle modifications. Monitor appropriate labs. - Hemoglobin A1c - Insulin, random  4. Vitamin D deficiency  - Continue supplementation. - VITAMIN D 25 Hydroxy  5. Idiopathic gout  - Uric acid  6. Chronic atrial fibrillation (HCC)  - Protime-INR  7. Chronic obstructive pulmonary disease ([place])   8. Long term current use of anticoagulant therapy  -  Protime-INR  9. Medication management  - CBC with Differential/Platelet - BASIC METABOLIC PANEL WITH GFR - Hepatic function panel - Magnesium      Strongly discouraged smoking. Recommended regular exercise, BP monitoring, weight control, and discussed med and SE's. Recommended labs to assess and monitor clinical status. Further disposition pending results of labs. Over 30 minutes of exam, counseling, chart review was performed.

## 2016-09-09 ENCOUNTER — Encounter: Payer: Self-pay | Admitting: Internal Medicine

## 2016-09-09 LAB — VITAMIN D 25 HYDROXY (VIT D DEFICIENCY, FRACTURES): Vit D, 25-Hydroxy: 76 ng/mL (ref 30–100)

## 2016-09-09 LAB — PROTIME-INR
INR: 2.2 — AB
Prothrombin Time: 23.1 s — ABNORMAL HIGH (ref 9.0–11.5)

## 2016-09-09 LAB — INSULIN, RANDOM: Insulin: 27.9 u[IU]/mL — ABNORMAL HIGH (ref 2.0–19.6)

## 2016-09-13 DIAGNOSIS — N184 Chronic kidney disease, stage 4 (severe): Secondary | ICD-10-CM | POA: Diagnosis not present

## 2016-09-13 DIAGNOSIS — N189 Chronic kidney disease, unspecified: Secondary | ICD-10-CM | POA: Diagnosis not present

## 2016-09-13 DIAGNOSIS — N2581 Secondary hyperparathyroidism of renal origin: Secondary | ICD-10-CM | POA: Diagnosis not present

## 2016-09-16 DIAGNOSIS — D631 Anemia in chronic kidney disease: Secondary | ICD-10-CM | POA: Diagnosis not present

## 2016-09-16 DIAGNOSIS — Z720 Tobacco use: Secondary | ICD-10-CM | POA: Diagnosis not present

## 2016-09-16 DIAGNOSIS — N2581 Secondary hyperparathyroidism of renal origin: Secondary | ICD-10-CM | POA: Diagnosis not present

## 2016-09-16 DIAGNOSIS — Z6835 Body mass index (BMI) 35.0-35.9, adult: Secondary | ICD-10-CM | POA: Diagnosis not present

## 2016-09-16 DIAGNOSIS — I129 Hypertensive chronic kidney disease with stage 1 through stage 4 chronic kidney disease, or unspecified chronic kidney disease: Secondary | ICD-10-CM | POA: Diagnosis not present

## 2016-09-16 DIAGNOSIS — N184 Chronic kidney disease, stage 4 (severe): Secondary | ICD-10-CM | POA: Diagnosis not present

## 2016-09-21 DIAGNOSIS — G4733 Obstructive sleep apnea (adult) (pediatric): Secondary | ICD-10-CM | POA: Diagnosis not present

## 2016-09-23 ENCOUNTER — Other Ambulatory Visit: Payer: Self-pay | Admitting: Cardiovascular Disease

## 2016-09-23 DIAGNOSIS — I714 Abdominal aortic aneurysm, without rupture, unspecified: Secondary | ICD-10-CM

## 2016-09-23 DIAGNOSIS — I739 Peripheral vascular disease, unspecified: Secondary | ICD-10-CM

## 2016-09-26 ENCOUNTER — Emergency Department (HOSPITAL_COMMUNITY): Payer: PPO

## 2016-09-26 ENCOUNTER — Encounter (HOSPITAL_COMMUNITY): Payer: Self-pay | Admitting: Emergency Medicine

## 2016-09-26 ENCOUNTER — Emergency Department (HOSPITAL_COMMUNITY)
Admission: EM | Admit: 2016-09-26 | Discharge: 2016-09-27 | Disposition: A | Discharge status: Home / Self Care | Payer: PPO | Attending: Emergency Medicine | Admitting: Emergency Medicine

## 2016-09-26 DIAGNOSIS — Z951 Presence of aortocoronary bypass graft: Secondary | ICD-10-CM | POA: Diagnosis not present

## 2016-09-26 DIAGNOSIS — J449 Chronic obstructive pulmonary disease, unspecified: Secondary | ICD-10-CM | POA: Insufficient documentation

## 2016-09-26 DIAGNOSIS — N184 Chronic kidney disease, stage 4 (severe): Secondary | ICD-10-CM | POA: Diagnosis not present

## 2016-09-26 DIAGNOSIS — K5732 Diverticulitis of large intestine without perforation or abscess without bleeding: Secondary | ICD-10-CM | POA: Insufficient documentation

## 2016-09-26 DIAGNOSIS — I129 Hypertensive chronic kidney disease with stage 1 through stage 4 chronic kidney disease, or unspecified chronic kidney disease: Secondary | ICD-10-CM | POA: Diagnosis not present

## 2016-09-26 DIAGNOSIS — I2510 Atherosclerotic heart disease of native coronary artery without angina pectoris: Secondary | ICD-10-CM | POA: Insufficient documentation

## 2016-09-26 DIAGNOSIS — E039 Hypothyroidism, unspecified: Secondary | ICD-10-CM | POA: Diagnosis not present

## 2016-09-26 DIAGNOSIS — Z7901 Long term (current) use of anticoagulants: Secondary | ICD-10-CM | POA: Insufficient documentation

## 2016-09-26 DIAGNOSIS — Z7982 Long term (current) use of aspirin: Secondary | ICD-10-CM | POA: Diagnosis not present

## 2016-09-26 DIAGNOSIS — F17210 Nicotine dependence, cigarettes, uncomplicated: Secondary | ICD-10-CM | POA: Diagnosis not present

## 2016-09-26 DIAGNOSIS — N289 Disorder of kidney and ureter, unspecified: Secondary | ICD-10-CM | POA: Diagnosis not present

## 2016-09-26 DIAGNOSIS — E1122 Type 2 diabetes mellitus with diabetic chronic kidney disease: Secondary | ICD-10-CM | POA: Diagnosis not present

## 2016-09-26 DIAGNOSIS — K625 Hemorrhage of anus and rectum: Secondary | ICD-10-CM | POA: Insufficient documentation

## 2016-09-26 DIAGNOSIS — K5733 Diverticulitis of large intestine without perforation or abscess with bleeding: Secondary | ICD-10-CM

## 2016-09-26 LAB — CBC WITH DIFFERENTIAL/PLATELET
Basophils Absolute: 0 10*3/uL (ref 0.0–0.1)
Basophils Relative: 0 %
EOS ABS: 0.2 10*3/uL (ref 0.0–0.7)
EOS PCT: 3 %
HCT: 31.2 % — ABNORMAL LOW (ref 39.0–52.0)
Hemoglobin: 10.1 g/dL — ABNORMAL LOW (ref 13.0–17.0)
LYMPHS ABS: 1.3 10*3/uL (ref 0.7–4.0)
Lymphocytes Relative: 18 %
MCH: 30.3 pg (ref 26.0–34.0)
MCHC: 32.4 g/dL (ref 30.0–36.0)
MCV: 93.7 fL (ref 78.0–100.0)
MONO ABS: 0.7 10*3/uL (ref 0.1–1.0)
Monocytes Relative: 10 %
Neutro Abs: 5 10*3/uL (ref 1.7–7.7)
Neutrophils Relative %: 69 %
PLATELETS: 198 10*3/uL (ref 150–400)
RBC: 3.33 MIL/uL — AB (ref 4.22–5.81)
RDW: 15.4 % (ref 11.5–15.5)
WBC: 7.2 10*3/uL (ref 4.0–10.5)

## 2016-09-26 LAB — URINALYSIS, ROUTINE W REFLEX MICROSCOPIC
BACTERIA UA: NONE SEEN
BILIRUBIN URINE: NEGATIVE
Glucose, UA: NEGATIVE mg/dL
KETONES UR: NEGATIVE mg/dL
LEUKOCYTES UA: NEGATIVE
NITRITE: NEGATIVE
Protein, ur: NEGATIVE mg/dL
Specific Gravity, Urine: 1.005 (ref 1.005–1.030)
Squamous Epithelial / LPF: NONE SEEN
WBC UA: NONE SEEN WBC/hpf (ref 0–5)
pH: 5 (ref 5.0–8.0)

## 2016-09-26 LAB — COMPREHENSIVE METABOLIC PANEL
ALBUMIN: 3.7 g/dL (ref 3.5–5.0)
ALT: 28 U/L (ref 17–63)
AST: 47 U/L — AB (ref 15–41)
Alkaline Phosphatase: 64 U/L (ref 38–126)
Anion gap: 11 (ref 5–15)
BUN: 33 mg/dL — AB (ref 6–20)
CHLORIDE: 98 mmol/L — AB (ref 101–111)
CO2: 27 mmol/L (ref 22–32)
CREATININE: 2.81 mg/dL — AB (ref 0.61–1.24)
Calcium: 9.3 mg/dL (ref 8.9–10.3)
GFR calc Af Amer: 24 mL/min — ABNORMAL LOW (ref >60)
GFR calc non Af Amer: 21 mL/min — ABNORMAL LOW (ref >60)
GLUCOSE: 100 mg/dL — AB (ref 65–99)
Potassium: 4 mmol/L (ref 3.5–5.1)
SODIUM: 136 mmol/L (ref 135–145)
Total Bilirubin: 1.3 mg/dL — ABNORMAL HIGH (ref 0.3–1.2)
Total Protein: 7.7 g/dL (ref 6.5–8.1)

## 2016-09-26 LAB — CBC
HCT: 32.7 % — ABNORMAL LOW (ref 39.0–52.0)
Hemoglobin: 10.6 g/dL — ABNORMAL LOW (ref 13.0–17.0)
MCH: 30.5 pg (ref 26.0–34.0)
MCHC: 32.4 g/dL (ref 30.0–36.0)
MCV: 94.2 fL (ref 78.0–100.0)
PLATELETS: 220 10*3/uL (ref 150–400)
RBC: 3.47 MIL/uL — ABNORMAL LOW (ref 4.22–5.81)
RDW: 15.5 % (ref 11.5–15.5)
WBC: 7.7 10*3/uL (ref 4.0–10.5)

## 2016-09-26 LAB — TYPE AND SCREEN
ABO/RH(D): A POS
Antibody Screen: NEGATIVE

## 2016-09-26 LAB — PROTIME-INR
INR: 2.6
Prothrombin Time: 28.4 seconds — ABNORMAL HIGH (ref 11.4–15.2)

## 2016-09-26 MED ORDER — IOPAMIDOL (ISOVUE-300) INJECTION 61%
INTRAVENOUS | Status: AC
  Filled 2016-09-26: qty 30

## 2016-09-26 NOTE — ED Triage Notes (Signed)
Pt states he noticed blood in his urine Friday night as well as blood today when he wiped. Pt on coumadin for afib. Pt also c/o pressure in his bladder. Pt in NAD. Ambulatory. AAOX4. VSS.

## 2016-09-26 NOTE — ED Provider Notes (Signed)
[facility] DEPT Provider Note   CSN: [id number] Arrival date & time: 09/26/16  1911     History   Chief Complaint Chief Complaint  Patient presents with  . Rectal Bleeding  . Hematuria    HPI JERRAL VEA is a 75 y.o. male. He presents for evaluation of some blood in his urine, and some blood in a bowel movement. His history of permanent A. fib. He is on Coumadin. States that he noticed some blood in his urine tonight. On arrival his urine is clear and has only microscopic hematuria. Describes some suprapubic cramping. No frank abdominal pain. No nausea vomiting. Head formed bowel movement and states that he saw a little bit of blood on the toilet paper roll bit of blood in the toilet afterwards. Has history of colonoscopy most recently 2008. It had a solitary sessile polyp that was removed. It have diverticuli. Had internal hemorrhoids. He states overall he feels well. Not lightheaded weak or dizzy. Syncopal or presyncopal.  History of known chronic renal insufficiency. HPI  Past Medical History:  Diagnosis Date  . AAA (abdominal aortic aneurysm) ([place])   . CAD (coronary artery disease)    a. CABG '00. b. all SVGs occluded 2010. c. low risk Nuc 2012   . Cataracts, bilateral   . Chronic anticoagulation   . CKD (chronic kidney disease), stage III   . COPD (chronic obstructive pulmonary disease) ([place])   . GERD (gastroesophageal reflux disease)   . Gout   . Hiatal hernia   . History of Doppler ultrasound 04/05/2013   LEAs; small distal abd. aoritc aneuysm is stable; fem-pop graft to R leg has 50-69% blockage  . History of echocardiogram 04/17/2013   EF 40-45%; LV hypertrophy; LV mild-mod dilated; AV regurg.; LA severely diated, RA mildly dilated  . History of nuclear stress test 05/14/2011   Dipyridamole; moderate perfusion defect in Basal Infrioer & Mid Inferior region - consistent w/infarct or scar; global LV systolic function mildly reduced; EKG negative for ischemia; low  risk scan  . Hyperlipidemia   . Hypertension   . Ischemic cardiomyopathy   . Kidney cysts   . Mechanical heart valve present    a. Hx mechanical St Jude AVR 2000.  [name] OSA (obstructive sleep apnea)   . Peripheral arterial disease ([place])    a. s/p aorto bifem BG;  b. 11/2013 PVA distal R Fem-fem stenosis;  c. 12/2013 PTA of dist R femoral bypass graft.  . Permanent atrial fibrillation ([place])   . Prediabetes   . Ventricular tachycardia (paroxysmal) ([place])    Seen by Dr. Lovena Le with EP, started on amiodarone  . Vitamin B deficiency     Patient Active Problem List   Diagnosis Date Noted  . Chronic obstructive pulmonary disease ([place]) 09/08/2016  . Encounter for general adult medical examination with abnormal findings 06/02/2016  . Obesity (BMI 30-39.9) 04/13/2016  . Epistaxis 03/06/2016  . Medicare annual wellness visit, subsequent 08/18/2015  . Ventricular tachycardia (paroxysmal) ([place]) 06/11/2015  . Tobacco use disorder 05/15/2015  . Morbid obesity  (BMI 35.57) 05/15/2015  . Poor compliance 02/10/2015  . Hypothyroidism 10/07/2014  . Generalized anxiety disorder 10/03/2014  . Status post mechanical aortic valve replacement 09/02/2014  . Medication management 02/21/2014  . Vitamin D deficiency 02/21/2014  . Long term current use of anticoagulant therapy 01/16/2014  . Permanent atrial fibrillation ([place]) 12/04/2013  . Type 2 DM with CKD stage 4 and hypertension ([place]) 12/04/2013  . Peripheral arterial disease ([place]) 12/02/2013  .  Kidney cysts   . GERD (gastroesophageal reflux disease)   . Gout   . OSA and COPD overlap syndrome ([place])   . AAA (abdominal aortic aneurysm) ([place])   . Vitamin B deficiency   . Renal artery stenosis ([place]) 05/01/2013  . PAD,s/p aorto bifem BG, s/p Rt Fem PTA 3/15 04/06/2013  . COLONIC POLYPS 12/26/2007  . Hyperlipidemia 12/26/2007  . Essential hypertension 12/26/2007  . Coronary atherosclerosis 12/26/2007  . ESOPHAGEAL STRICTURE 12/26/2007    Past Surgical  History:  Procedure Laterality Date  . AORTIC VALVE REPLACEMENT     St. Jude  . CARDIAC CATHETERIZATION  11/25/2008   loss of 2/3 (RCA, OM) bypass grafts with patent internal mammary artery to LAD; large collateral filling of RCA ; osteal narrowing of circumflex of ~50%  . CARDIOVERSION  8/282012  . CORONARY ARTERY BYPASS GRAFT  2000   3 vessel; LIMA to LAD; RCA, OM  . FEMORAL BYPASS    . LOWER EXTREMITY ANGIOGRAM N/A 12/03/2013   Procedure: LOWER EXTREMITY ANGIOGRAM;  Surgeon: Lorretta Harp, MD;  Location: [facility] CATH LAB;  Service: Cardiovascular;  Laterality: N/A;  . LOWER EXTREMITY ANGIOGRAM N/A 01/07/2014   Procedure: LOWER EXTREMITY ANGIOGRAM;  Surgeon: Lorretta Harp, MD;  Location: [facility] CATH LAB;  Service: Cardiovascular;  Laterality: N/A;  . shunts in femoral arteries    . stents in kidneys         Home Medications    Prior to Admission medications   Medication Sig Start Date End Date Taking? Authorizing Provider  allopurinol (ZYLOPRIM) 300 MG tablet TAKE 1 TABLET BY MOUTH DAILY. TO PREVENT GOUT 08/17/16  Yes Unk Pinto, MD  amiodarone (PACERONE) 200 MG tablet TAKE 1 TABLET (200 MG TOTAL) BY MOUTH DAILY. 08/11/16  Yes Pixie Casino, MD  aspirin EC 81 MG tablet Take 81 mg by mouth daily.   Yes Historical Provider, MD  bumetanide (BUMEX) 2 MG tablet Take 1/2 tablet daily as needed for swelling Patient taking differently: Take 2 mg by mouth every Monday, Wednesday, and Friday.  06/11/15  Yes Rhonda G Barrett, PA-C  Cholecalciferol (VITAMIN D3) 5000 units TABS Take 10,000 Units by mouth daily.   Yes Historical Provider, MD  clotrimazole-betamethasone (LOTRISONE) cream Apply 1 application topically daily as needed (rash/ skin irritation).   Yes Historical Provider, MD  fenofibrate (TRICOR) 145 MG tablet TAKE 1 TABLET DAILY FOR BLOOD FATS 01/30/16  Yes Vicie Mutters, PA-C  isosorbide mononitrate (IMDUR) 30 MG 24 hr tablet TAKE 1 TABLET BY MOUTH EVERY DAY 01/30/16  Yes Vicie Mutters, PA-C  levothyroxine (SYNTHROID, LEVOTHROID) 50 MCG tablet TAKE 1 TABLET BY MOUTH EVERY DAY Patient taking differently: TAKE 1 1/2 TABLETS BY MOUTH TUESDAY AND THURSDAY, TAKE 1 TABLET ON ALL OTHER DAYS - IN THE MORNING 05/19/16  Yes Vicie Mutters, PA-C  montelukast (SINGULAIR) 10 MG tablet TAKE 1 TABLET (10 MG TOTAL) BY MOUTH DAILY. 08/11/16  Yes Vicie Mutters, PA-C  NITROSTAT 0.4 MG SL tablet PLACE 1 TABLET (0.4 MG TOTAL) UNDER THE TONGUE EVERY 5 (FIVE) MINUTES AS NEEDED FOR CHEST PAIN. 02/11/16  Yes Vicie Mutters, PA-C  pravastatin (PRAVACHOL) 40 MG tablet TAKE 1 TABLET (40 MG TOTAL) BY MOUTH EVERY MORNING. 06/02/16  Yes Unk Pinto, MD  PRESCRIPTION MEDICATION Inhale into the lungs See admin instructions. Use CPAP whenever sleeping   Yes Historical Provider, MD  sertraline (ZOLOFT) 50 MG tablet TAKE 1 TABLET (50 MG TOTAL) BY MOUTH DAILY. Patient taking differently: TAKE 1 TABLET (50  MG TOTAL) BY MOUTH DAILY AT BEDTIME 04/09/16  Yes Unk Pinto, MD  verapamil (CALAN) 80 MG tablet TAKE 1 TABLET (80 MG TOTAL) BY MOUTH 2 (TWO) TIMES DAILY. 08/13/16  Yes Unk Pinto, MD  vitamin B-12 (CYANOCOBALAMIN) 1000 MCG tablet Take 1,000 mcg by mouth daily.   Yes Historical Provider, MD  warfarin (COUMADIN) 5 MG tablet Take 1 tablet daily or as directed by your doctor. Patient taking differently: Take 2.5-5 mg by mouth See admin instructions. Take 1/2 tablet (2.5 mg) by mouth Monday, Wednesday, Friday at bedtime, take 1 tablet (5 mg) on Sunday, Tuesday, Thursday, Saturday at bedtime or as directed by your doctor. 07/29/16  Yes Unk Pinto, MD  HYDROcodone-acetaminophen (NORCO) 5-325 MG tablet Take 1 tablet by mouth every 6 (six) hours as needed. Patient not taking: Reported on 09/26/2016 11/27/15   Loma Sousa Forcucci, PA-C  ranitidine (ZANTAC) 300 MG tablet TAKE 1 TABLET (300 MG TOTAL) BY MOUTH AT BEDTIME. Patient not taking: Reported on 09/26/2016 12/17/15   Unk Pinto, MD  traMADol  (ULTRAM) 50 MG tablet Take 1 tablet (50 mg total) by mouth every 8 (eight) hours as needed. Patient not taking: Reported on 09/26/2016 07/05/16 07/05/17  Loma Sousa Forcucci, PA-C  verapamil (CALAN) 80 MG tablet TAKE 1 TABLET (80 MG TOTAL) BY MOUTH 2 (TWO) TIMES DAILY. Patient not taking: Reported on 09/26/2016 08/11/16   Vicie Mutters, PA-C    Family History Family History  Problem Relation Age of Onset  . Heart attack Mother   . Heart attack Father   . Lung cancer Sister     x 2  . Kidney cancer Sister   . Kidney disease Sister   . Cancer Brother     ?  . Stroke Brother     Social History Social History  Substance Use Topics  . Smoking status: Current Every Day Smoker    Packs/day: 0.05    Years: 40.00    Types: Cigarettes  . Smokeless tobacco: Never Used  . Alcohol use No     Allergies   Chantix [varenicline]; Lyrica [pregabalin]; Nsaids; Simvastatin; and Ziac [bisoprolol-hydrochlorothiazide]   Review of Systems Review of Systems  Constitutional: Negative for appetite change, chills, diaphoresis, fatigue and fever.  HENT: Negative for mouth sores, sore throat and trouble swallowing.   Eyes: Negative for visual disturbance.  Respiratory: Negative for cough, chest tightness, shortness of breath and wheezing.   Cardiovascular: Negative for chest pain.  Gastrointestinal: Positive for blood in stool. Negative for abdominal distention, abdominal pain, diarrhea, nausea and vomiting.       Lower abdominal cramping  Endocrine: Negative for polydipsia, polyphagia and polyuria.  Genitourinary: Positive for hematuria. Negative for dysuria and frequency.  Musculoskeletal: Negative for gait problem.  Skin: Negative for color change, pallor and rash.  Neurological: Negative for dizziness, syncope, light-headedness and headaches.  Hematological: Does not bruise/bleed easily.  Psychiatric/Behavioral: Negative for behavioral problems and confusion.     Physical Exam Updated  Vital Signs BP 148/68   Pulse 66   Temp 98.6 F (37 C) (Oral)   Resp 19   SpO2 100%   Physical Exam  Constitutional: He is oriented to person, place, and time. He appears well-developed and well-nourished. No distress.  HENT:  Head: Normocephalic.  Eyes: Conjunctivae are normal. Pupils are equal, round, and reactive to light. No scleral icterus.  Neck: Normal range of motion. Neck supple. No thyromegaly present.  Cardiovascular: Normal rate and regular rhythm.  Exam reveals no gallop and no friction  rub.   No murmur heard. Pulmonary/Chest: Effort normal and breath sounds normal. No respiratory distress. He has no wheezes. He has no rales.  Abdominal: Soft. Bowel sounds are normal. He exhibits no distension. There is no tenderness. There is no rebound.  Soft benign abdomen. Rectal exam produces no blood. ?palpable internal hemorrhoid.  Musculoskeletal: Normal range of motion.  Neurological: He is alert and oriented to person, place, and time.  Skin: Skin is warm and dry. No rash noted.  Psychiatric: He has a normal mood and affect. His behavior is normal.     ED Treatments / Results  Labs (all labs ordered are listed, but only abnormal results are displayed) Labs Reviewed  COMPREHENSIVE METABOLIC PANEL - Abnormal; Notable for the following:       Result Value   Chloride 98 (*)    Glucose, Bld 100 (*)    BUN 33 (*)    Creatinine, Ser 2.81 (*)    AST 47 (*)    Total Bilirubin 1.3 (*)    GFR calc non Af Amer 21 (*)    GFR calc Af Amer 24 (*)    All other components within normal limits  CBC - Abnormal; Notable for the following:    RBC 3.47 (*)    Hemoglobin 10.6 (*)    HCT 32.7 (*)    All other components within normal limits  PROTIME-INR - Abnormal; Notable for the following:    Prothrombin Time 28.4 (*)    All other components within normal limits  URINALYSIS, ROUTINE W REFLEX MICROSCOPIC - Abnormal; Notable for the following:    Hgb urine dipstick MODERATE (*)     All other components within normal limits  CBC WITH DIFFERENTIAL/PLATELET - Abnormal; Notable for the following:    RBC 3.33 (*)    Hemoglobin 10.1 (*)    HCT 31.2 (*)    All other components within normal limits  TYPE AND SCREEN  ABO/RH    EKG  EKG Interpretation None       Radiology No results found.  Procedures Procedures (including critical care time)  Medications Ordered in ED Medications  iopamidol (ISOVUE-300) 61 % injection (not administered)     Initial Impression / Assessment and Plan / ED Course  I have reviewed the triage vital signs and the nursing notes.  Pertinent labs & imaging results that were available during my care of the patient were reviewed by me and considered in my medical decision making (see chart for details).  Clinical Course     Microscopic hematuria. No blood on rectal exam. He will 10.1. Repeated at 2 hours without change. CT abdomen obtained and pending.  If CT is normal patient can safely be discharged home. If he has small stone could very likely be sent home with pain control. I discussed with he and his son that he produces more blood at home or feels lightheaded dizzy or further symptomatic and return for reevaluation repeat hemoglobin. Otherwise routine GI follow-up.  Final Clinical Impressions(s) / ED Diagnoses   Final diagnoses:  Rectal bleeding    New Prescriptions New Prescriptions   No medications on file     Tanna Furry, MD 09/26/16 [id number]

## 2016-09-27 ENCOUNTER — Telehealth: Payer: Self-pay | Admitting: Surgery

## 2016-09-27 DIAGNOSIS — N289 Disorder of kidney and ureter, unspecified: Secondary | ICD-10-CM | POA: Diagnosis not present

## 2016-09-27 LAB — ABO/RH: ABO/RH(D): A POS

## 2016-09-27 MED ORDER — CIPROFLOXACIN HCL 500 MG PO TABS: 500.0000 mg | ORAL_TABLET | Freq: Two times a day (BID) | ORAL | 0 refills | Status: DC

## 2016-09-27 MED ORDER — METRONIDAZOLE 500 MG PO TABS
500.0000 mg | ORAL_TABLET | Freq: Once | ORAL | Status: AC
  Administered 2016-09-27: 500 mg via ORAL
  Filled 2016-09-27: qty 1

## 2016-09-27 MED ORDER — METRONIDAZOLE 500 MG PO TABS: 500.0000 mg | ORAL_TABLET | Freq: Two times a day (BID) | ORAL | 0 refills | Status: DC

## 2016-09-27 MED ORDER — CIPROFLOXACIN HCL 500 MG PO TABS
500.0000 mg | ORAL_TABLET | Freq: Once | ORAL | Status: AC
  Administered 2016-09-27: 500 mg via ORAL
  Filled 2016-09-27: qty 1

## 2016-09-27 NOTE — Telephone Encounter (Signed)
ED CM received call from [facility] in [place] [phone] a prescription patient received from [facility] ED yesterday that may cause a possible interaction with another medication patient is taking. ED CM advised pharmacist and patient to contact patient's PCP regarding this issue. Pharmacist verbalized understanding, no further question or concerns verbalized.

## 2016-09-27 NOTE — ED Notes (Signed)
Pt returned from CT °

## 2016-09-27 NOTE — Discharge Instructions (Signed)
Decrease your Coumadin to 2.5 mg each day.  Follow-up with your physician for your Coumadin/INR check on Thursday.  Return to ER with any worsening bleeding.

## 2016-09-29 ENCOUNTER — Ambulatory Visit (HOSPITAL_COMMUNITY)
Admission: RE | Admit: 2016-09-29 | Discharge: 2016-09-29 | Disposition: A | Discharge status: Home / Self Care | Payer: PPO | Source: Ambulatory Visit | Attending: Cardiovascular Disease | Admitting: Cardiovascular Disease

## 2016-09-29 DIAGNOSIS — I700 Atherosclerosis of aorta: Secondary | ICD-10-CM | POA: Diagnosis not present

## 2016-09-29 DIAGNOSIS — R9439 Abnormal result of other cardiovascular function study: Secondary | ICD-10-CM | POA: Diagnosis not present

## 2016-09-29 DIAGNOSIS — I714 Abdominal aortic aneurysm, without rupture, unspecified: Secondary | ICD-10-CM

## 2016-09-29 DIAGNOSIS — I739 Peripheral vascular disease, unspecified: Secondary | ICD-10-CM | POA: Diagnosis not present

## 2016-09-30 ENCOUNTER — Ambulatory Visit (INDEPENDENT_AMBULATORY_CARE_PROVIDER_SITE_OTHER): Payer: PPO | Admitting: Internal Medicine

## 2016-09-30 ENCOUNTER — Encounter: Payer: Self-pay | Admitting: Internal Medicine

## 2016-09-30 VITALS — BP 138/70 | HR 76 | Temp 98.2°F | Resp 20 | Ht 67.0 in | Wt 230.0 lb

## 2016-09-30 DIAGNOSIS — K5732 Diverticulitis of large intestine without perforation or abscess without bleeding: Secondary | ICD-10-CM | POA: Diagnosis not present

## 2016-09-30 DIAGNOSIS — I482 Chronic atrial fibrillation: Secondary | ICD-10-CM

## 2016-09-30 DIAGNOSIS — I4821 Permanent atrial fibrillation: Secondary | ICD-10-CM

## 2016-09-30 DIAGNOSIS — Z7901 Long term (current) use of anticoagulants: Secondary | ICD-10-CM | POA: Diagnosis not present

## 2016-09-30 MED ORDER — AMOXICILLIN-POT CLAVULANATE 875-125 MG PO TABS: 1.0000 | ORAL_TABLET | Freq: Two times a day (BID) | ORAL | 0 refills | Status: DC

## 2016-09-30 MED ORDER — SERTRALINE HCL 50 MG PO TABS: ORAL_TABLET | ORAL | 1 refills | Status: DC

## 2016-09-30 NOTE — Patient Instructions (Signed)
Please take the augmentin  Twice daily with food.  Please continue to cut your usual coumadin dose in half.  Please call us if you have any bleeding issues.    Please finish the prescription of flagyl or metronidazol.  Please drink plenty of fluids.  Please use metamucil or benefiber in your coffee to prevent constipation.  This is one of the major triggers for diverticulitis.    Call the office if you have worsening abdominal pain, nausea, vomiting, or any other concerning factors.

## 2016-09-30 NOTE — Progress Notes (Signed)
Assessment and Plan:   1. Permanent atrial fibrillation (HCC) -cut coumadin in half while on abx -cont coumadin -cont rate control -secondary to artifical valve  2. Long term current use of anticoagulant therapy -cont couamdin -recheck INR in 2 weeks  3. Diverticulitis of colon -Augmentin prescribed today in place of cipro as patient could not take cipro due to medications interactions per pharmacy -cont flagyl -recheck in 2 weeks -patient aware to go to the ER if worsening abd pain, intractable N/V, or severe rectal bleeding    HPI 75 y.o.male presents for follow up from the hospital. Admit date to the hospital was 09/26/16, patient was discharged from the hospital on 09/27/16 and our office contacted the office the day after discharge to set up a follow up appointment, patient was admitted for: rectal bleeding and blood in urine.  He had no evidence of UTI on UA.  INR was normal.  Blood work at a stable level.  CT abdomen and pelvis consistent with possible diverticulosis.  He was discharged with prescriptions for cipro and flagyl.  He did get flagyl and started taking it.  The pharmacist would not fill cipro due to an interaction with his heart medications.  He was told to speak with [place].  He has since improved somewhat.  Abdominal pain is getting better.  He is not having fevers, chills, nausea, vomiting, SOB, hematochezia, melena, or hematemesis.  He has cut his coumadin dose in half.    Images while in the hospital: No results found.  Past Medical History:  Diagnosis Date  . AAA (abdominal aortic aneurysm) ([place])   . CAD (coronary artery disease)    a. CABG '00. b. all SVGs occluded 2010. c. low risk Nuc 2012   . Cataracts, bilateral   . Chronic anticoagulation   . CKD (chronic kidney disease), stage III   . COPD (chronic obstructive pulmonary disease) ([place])   . GERD (gastroesophageal reflux disease)   . Gout   . Hiatal hernia   . History of Doppler ultrasound 04/05/2013    LEAs; small distal abd. aoritc aneuysm is stable; fem-pop graft to R leg has 50-69% blockage  . History of echocardiogram 04/17/2013   EF 40-45%; LV hypertrophy; LV mild-mod dilated; AV regurg.; LA severely diated, RA mildly dilated  . History of nuclear stress test 05/14/2011   Dipyridamole; moderate perfusion defect in Basal Infrioer & Mid Inferior region - consistent w/infarct or scar; global LV systolic function mildly reduced; EKG negative for ischemia; low risk scan  . Hyperlipidemia   . Hypertension   . Ischemic cardiomyopathy   . Kidney cysts   . Mechanical heart valve present    a. Hx mechanical St Jude AVR 2000.  [name] OSA (obstructive sleep apnea)   . Peripheral arterial disease ([place])    a. s/p aorto bifem BG;  b. 11/2013 PVA distal R Fem-fem stenosis;  c. 12/2013 PTA of dist R femoral bypass graft.  . Permanent atrial fibrillation ([place])   . Prediabetes   . Ventricular tachycardia (paroxysmal) ([place])    Seen by Dr. Lovena Le with EP, started on amiodarone  . Vitamin B deficiency      Allergies  Allergen Reactions  . Chantix [Varenicline] Nausea And Vomiting  . Lyrica [Pregabalin]     Swelling, couldn't breathe  . Nsaids Other (See Comments)    Cannot take while on coumadin  . Simvastatin     Pt said he had a "bleed out" after taking it  . Ziac [Bisoprolol-Hydrochlorothiazide]  fatigue      Current Outpatient Prescriptions on File Prior to Visit  Medication Sig Dispense Refill  . allopurinol (ZYLOPRIM) 300 MG tablet TAKE 1 TABLET BY MOUTH DAILY. TO PREVENT GOUT 90 tablet 1  . amiodarone (PACERONE) 200 MG tablet TAKE 1 TABLET (200 MG TOTAL) BY MOUTH DAILY. 30 tablet 6  . aspirin EC 81 MG tablet Take 81 mg by mouth daily.    . bumetanide (BUMEX) 2 MG tablet Take 1/2 tablet daily as needed for swelling (Patient taking differently: Take 2 mg by mouth every Monday, Wednesday, and Friday. ) 30 tablet 6  . Cholecalciferol (VITAMIN D3) 5000 units TABS Take 10,000 Units by mouth  daily.    . ciprofloxacin (CIPRO) 500 MG tablet Take 1 tablet (500 mg total) by mouth every 12 (twelve) hours. 14 tablet 0  . clotrimazole-betamethasone (LOTRISONE) cream Apply 1 application topically daily as needed (rash/ skin irritation).    . fenofibrate (TRICOR) 145 MG tablet TAKE 1 TABLET DAILY FOR BLOOD FATS 90 tablet 3  . isosorbide mononitrate (IMDUR) 30 MG 24 hr tablet TAKE 1 TABLET BY MOUTH EVERY DAY 90 tablet 1  . levothyroxine (SYNTHROID, LEVOTHROID) 50 MCG tablet TAKE 1 TABLET BY MOUTH EVERY DAY (Patient taking differently: TAKE 1 1/2 TABLETS BY MOUTH TUESDAY AND THURSDAY, TAKE 1 TABLET ON ALL OTHER DAYS - IN THE MORNING) 90 tablet 1  . metroNIDAZOLE (FLAGYL) 500 MG tablet Take 1 tablet (500 mg total) by mouth 2 (two) times daily. 14 tablet 0  . montelukast (SINGULAIR) 10 MG tablet TAKE 1 TABLET (10 MG TOTAL) BY MOUTH DAILY. 30 tablet 2  . NITROSTAT 0.4 MG SL tablet PLACE 1 TABLET (0.4 MG TOTAL) UNDER THE TONGUE EVERY 5 (FIVE) MINUTES AS NEEDED FOR CHEST PAIN. 25 tablet 5  . pravastatin (PRAVACHOL) 40 MG tablet TAKE 1 TABLET (40 MG TOTAL) BY MOUTH EVERY MORNING. 90 tablet 1  . PRESCRIPTION MEDICATION Inhale into the lungs See admin instructions. Use CPAP whenever sleeping    . sertraline (ZOLOFT) 50 MG tablet TAKE 1 TABLET (50 MG TOTAL) BY MOUTH DAILY. (Patient taking differently: TAKE 1 TABLET (50 MG TOTAL) BY MOUTH DAILY AT BEDTIME) 90 tablet 1  . verapamil (CALAN) 80 MG tablet TAKE 1 TABLET (80 MG TOTAL) BY MOUTH 2 (TWO) TIMES DAILY. 180 tablet 1  . vitamin B-12 (CYANOCOBALAMIN) 1000 MCG tablet Take 1,000 mcg by mouth daily.    [name] warfarin (COUMADIN) 5 MG tablet Take 1 tablet daily or as directed by your doctor. (Patient taking differently: Take 2.5-5 mg by mouth See admin instructions. Take 1/2 tablet (2.5 mg) times 3 days and 1 tablet ( 5 mg) times 4 days) 90 tablet 3   No current facility-administered medications on file prior to visit.     ROS: all negative except above.    Physical Exam: Filed Weights   09/30/16 1059  Weight: 230 lb (104.3 kg)   BP 138/70   Pulse 76   Temp 98.2 F (36.8 C) (Temporal)   Resp 20   Ht 5\' 7"  (1.702 m)   Wt 230 lb (104.3 kg)   BMI 36.02 kg/m  General Appearance: Well nourished, in no apparent distress. Eyes: PERRLA, EOMs, conjunctiva no swelling or erythema Sinuses: No Frontal/maxillary tenderness ENT/Mouth: Ext aud canals clear, TMs without erythema, bulging. No erythema, swelling, or exudate on post pharynx.  Tonsils not swollen or erythematous. Hearing normal.  Neck: Supple, thyroid normal.  Respiratory: Respiratory effort normal, BS equal bilaterally without rales,  rhonchi, wheezing or stridor.  Cardio: Ireg ireg with no RGs. There is 2/6 murmur on LSB Brisk peripheral pulses without edema.  Abdomen: Soft, + BS.  Mildly tender in LLQ, no guarding, rebound, hernias, masses. Lymphatics: Non tender without lymphadenopathy.  Musculoskeletal: Full ROM, 5/5 strength, normal gait.  Skin: Warm, dry without rashes, lesions, ecchymosis.  Neuro: Cranial nerves intact. Normal muscle tone, no cerebellar symptoms. Sensation intact.  Psych: Awake and oriented X 3, normal affect, Insight and Judgment appropriate.     [name], PA-C 11:17 AM [facility]

## 2016-10-01 ENCOUNTER — Other Ambulatory Visit: Payer: Self-pay | Admitting: Internal Medicine

## 2016-10-04 ENCOUNTER — Other Ambulatory Visit: Payer: Self-pay | Admitting: Cardiovascular Disease

## 2016-10-04 DIAGNOSIS — I714 Abdominal aortic aneurysm, without rupture, unspecified: Secondary | ICD-10-CM

## 2016-10-04 DIAGNOSIS — I739 Peripheral vascular disease, unspecified: Secondary | ICD-10-CM

## 2016-10-08 ENCOUNTER — Encounter: Payer: Self-pay | Admitting: Internal Medicine

## 2016-10-13 ENCOUNTER — Ambulatory Visit (INDEPENDENT_AMBULATORY_CARE_PROVIDER_SITE_OTHER): Payer: PPO | Admitting: Physician Assistant

## 2016-10-13 ENCOUNTER — Encounter: Payer: Self-pay | Admitting: Physician Assistant

## 2016-10-13 VITALS — BP 132/76 | HR 66 | Temp 98.4°F | Resp 16 | Ht 67.0 in | Wt 237.8 lb

## 2016-10-13 DIAGNOSIS — I482 Chronic atrial fibrillation, unspecified: Secondary | ICD-10-CM

## 2016-10-13 DIAGNOSIS — I480 Paroxysmal atrial fibrillation: Secondary | ICD-10-CM | POA: Diagnosis not present

## 2016-10-13 DIAGNOSIS — J449 Chronic obstructive pulmonary disease, unspecified: Secondary | ICD-10-CM

## 2016-10-13 DIAGNOSIS — F17200 Nicotine dependence, unspecified, uncomplicated: Secondary | ICD-10-CM

## 2016-10-13 DIAGNOSIS — N184 Chronic kidney disease, stage 4 (severe): Secondary | ICD-10-CM | POA: Diagnosis not present

## 2016-10-13 DIAGNOSIS — I739 Peripheral vascular disease, unspecified: Secondary | ICD-10-CM | POA: Diagnosis not present

## 2016-10-13 DIAGNOSIS — I129 Hypertensive chronic kidney disease with stage 1 through stage 4 chronic kidney disease, or unspecified chronic kidney disease: Secondary | ICD-10-CM | POA: Diagnosis not present

## 2016-10-13 DIAGNOSIS — F411 Generalized anxiety disorder: Secondary | ICD-10-CM

## 2016-10-13 DIAGNOSIS — M1000 Idiopathic gout, unspecified site: Secondary | ICD-10-CM

## 2016-10-13 DIAGNOSIS — I10 Essential (primary) hypertension: Secondary | ICD-10-CM

## 2016-10-13 DIAGNOSIS — G4733 Obstructive sleep apnea (adult) (pediatric): Secondary | ICD-10-CM

## 2016-10-13 DIAGNOSIS — Z0001 Encounter for general adult medical examination with abnormal findings: Secondary | ICD-10-CM

## 2016-10-13 DIAGNOSIS — Z79899 Other long term (current) drug therapy: Secondary | ICD-10-CM

## 2016-10-13 DIAGNOSIS — E1122 Type 2 diabetes mellitus with diabetic chronic kidney disease: Secondary | ICD-10-CM

## 2016-10-13 DIAGNOSIS — I701 Atherosclerosis of renal artery: Secondary | ICD-10-CM | POA: Diagnosis not present

## 2016-10-13 DIAGNOSIS — I714 Abdominal aortic aneurysm, without rupture, unspecified: Secondary | ICD-10-CM

## 2016-10-13 DIAGNOSIS — Z0000 Encounter for general adult medical examination without abnormal findings: Secondary | ICD-10-CM

## 2016-10-13 DIAGNOSIS — Z7901 Long term (current) use of anticoagulants: Secondary | ICD-10-CM

## 2016-10-13 DIAGNOSIS — Z9119 Patient's noncompliance with other medical treatment and regimen: Secondary | ICD-10-CM

## 2016-10-13 DIAGNOSIS — E559 Vitamin D deficiency, unspecified: Secondary | ICD-10-CM

## 2016-10-13 DIAGNOSIS — D126 Benign neoplasm of colon, unspecified: Secondary | ICD-10-CM

## 2016-10-13 DIAGNOSIS — I472 Ventricular tachycardia, unspecified: Secondary | ICD-10-CM

## 2016-10-13 DIAGNOSIS — E782 Mixed hyperlipidemia: Secondary | ICD-10-CM

## 2016-10-13 DIAGNOSIS — E039 Hypothyroidism, unspecified: Secondary | ICD-10-CM

## 2016-10-13 DIAGNOSIS — E539 Vitamin B deficiency, unspecified: Secondary | ICD-10-CM

## 2016-10-13 DIAGNOSIS — I25708 Atherosclerosis of coronary artery bypass graft(s), unspecified, with other forms of angina pectoris: Secondary | ICD-10-CM | POA: Diagnosis not present

## 2016-10-13 DIAGNOSIS — R6889 Other general symptoms and signs: Secondary | ICD-10-CM

## 2016-10-13 DIAGNOSIS — R609 Edema, unspecified: Secondary | ICD-10-CM

## 2016-10-13 DIAGNOSIS — R0600 Dyspnea, unspecified: Secondary | ICD-10-CM | POA: Diagnosis not present

## 2016-10-13 DIAGNOSIS — K219 Gastro-esophageal reflux disease without esophagitis: Secondary | ICD-10-CM

## 2016-10-13 DIAGNOSIS — Z91199 Patient's noncompliance with other medical treatment and regimen due to unspecified reason: Secondary | ICD-10-CM

## 2016-10-13 DIAGNOSIS — Z952 Presence of prosthetic heart valve: Secondary | ICD-10-CM

## 2016-10-13 DIAGNOSIS — K222 Esophageal obstruction: Secondary | ICD-10-CM

## 2016-10-13 DIAGNOSIS — I4729 Other ventricular tachycardia: Secondary | ICD-10-CM

## 2016-10-13 LAB — PROTIME-INR
INR: 2 — AB
Prothrombin Time: 20.9 s — ABNORMAL HIGH (ref 9.0–11.5)

## 2016-10-13 LAB — TSH: TSH: 6.14 m[IU]/L — AB (ref 0.40–4.50)

## 2016-10-13 LAB — CBC WITH DIFFERENTIAL/PLATELET
BASOS ABS: 0 {cells}/uL (ref 0–200)
BASOS PCT: 0 %
EOS ABS: 116 {cells}/uL (ref 15–500)
Eosinophils Relative: 2 %
HEMATOCRIT: 33 % — AB (ref 38.5–50.0)
Hemoglobin: 10.3 g/dL — ABNORMAL LOW (ref 13.2–17.1)
LYMPHS PCT: 19 %
Lymphs Abs: 1102 cells/uL (ref 850–3900)
MCH: 30.3 pg (ref 27.0–33.0)
MCHC: 31.2 g/dL — ABNORMAL LOW (ref 32.0–36.0)
MCV: 97.1 fL (ref 80.0–100.0)
MONO ABS: 580 {cells}/uL (ref 200–950)
MONOS PCT: 10 %
MPV: 10.1 fL (ref 7.5–12.5)
NEUTROS PCT: 69 %
Neutro Abs: 4002 cells/uL (ref 1500–7800)
PLATELETS: 212 10*3/uL (ref 140–400)
RBC: 3.4 MIL/uL — ABNORMAL LOW (ref 4.20–5.80)
RDW: 15.9 % — AB (ref 11.0–15.0)
WBC: 5.8 10*3/uL (ref 3.8–10.8)

## 2016-10-13 NOTE — Patient Instructions (Addendum)
Take bumex daily for swelling Decrease salt No more than 1.5L of fluid a day Decrease sugars Monitor weight and BP daily, keep record  Follow up 1-2 weeks to check weight and BP If any shortness of breath, chest pain, worsening swelling, weakness, dizziness, severe leg cramps call the office OR GO TO THE ER!  [name]

## 2016-10-13 NOTE — Progress Notes (Signed)
Patient ID: Robert Stanton, male   DOB: 1941/10/01, 75 y.o.   MRN: [id number]  MEDICARE ANNUAL WELLNESS VISIT AND FOLLOW UP Assessment:     Long term current use of anticoagulant therapy -cont coumadin -dose adjust prn - Protime-INR  PAF (paroxysmal atrial fibrillation) (HCC) -cont coumadin - Protime-INR   Essential hypertension - continue medications, DASH diet, exercise and monitor at home. Call if greater than 130/80.  -cont meds -dash diet -quit smoking -monitor at home  Atherosclerosis of native coronary artery of native heart with unstable angina pectoris ([place]) -followed by cards  PAD,s/p aorto bifem BG, s/p Rt Fem PTA 3/15 -followed by vascular   Permanent atrial fibrillation ([place]) -follwed by cards   Renal artery stenosis ([place]) -follwed by cards and nephrology  Abdominal aortic aneurysm (AAA) without rupture ([place]) -followed by vascular  Peripheral arterial disease ([place]) -followed by vascular - Control blood pressure, cholesterol, glucose, increase exercise.    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) -cont meds -monitor a1c  Ventricular tachycardia (paroxysmal) (HCC) -followed by cards   Chronic obstructive pulmonary disease, unspecified COPD type ([place]) -recommended quitting smoking -he is not ready to quit -cont inhalers  OSA and COPD overlap syndrome ([place]) -quit smoking  Benign neoplasm of colon, unspecified part of colon -follow with colonoscopy  ESOPHAGEAL STRICTURE -cont meds  Gastroesophageal reflux disease, esophagitis presence not specified -avoid trigger foods -decrease meds  Hypothyroidism, unspecified hypothyroidism type Hypothyroidism-check TSH level, continue medications the same, reminded to take on an empty stomach 30-57mins before food.   Kidney cysts -followed by nephrology  Hyperlipidemia -continue medications, check lipids, decrease fatty foods, increase activity.    Idiopathic gout, unspecified  chronicity, unspecified site -cont meds   Vitamin B deficiency -cont B complex vitamin   Medication management -followed with labs   Vitamin D deficiency -cont supplement  Status post mechanical aortic valve replacement -cont coumadin  Generalized anxiety disorder -cont meds   Poor compliance -encouraged  Tobacco use disorder -quit  Morbid obesity, unspecified obesity type ([place]) -diet and exercise discussed  Medicare annual wellness visit, subsequent -due next year  Edema States bumex is not working as well, check labs including kidney function and TSH Decrease salt/water Do bumex daily, will do close follow up  No PND, orthopnea If any worsening shortness of breath/or any CP go to ER  Over 30 minutes of exam, counseling, chart review, and critical decision making was performed  Plan:   During the course of the visit the patient was educated and counseled about appropriate screening and preventive services including:    Pneumococcal vaccine   Influenza vaccine  Prevnar 13  Td vaccine  Screening electrocardiogram  Colorectal cancer screening  Diabetes screening  Glaucoma screening  Nutrition counseling     Subjective:  Robert Stanton is a 75 y.o. male who presents for Medicare Annual Wellness Visit and 1 month follow up for long term anticoagulation for PAF.  Last wellness visit was in 2016.    His blood pressure has been controlled at home, today their BP is BP: 132/76  He has CAD, s/p CABG and st Jude AoVR, with chronic afib, on coumadin. Has had TIA in the past and PAD s/p fem pop bypass.  He is on bumex but states that it does not seems to be working as well, on 2 mg, has decreased urine output and has gained 7 lbs with some edema, denies PND, worsening SOB, orthopnea.  Has COPD and OSA, continues  to smoke. He only takes it 3 days a week.  He is on 5 mg 4 days a week, and 2.5mg  the other 3 days.  He hasn't missed any doses of his  coumadin. He hasn't been bleeding at all. No hematochezia, melena, hematuria or nose bleeds. No falls, no hitting his head. Was on ABX for 1 week, but has been off x 1 week, and was only on the 2.5mg  of coumadin for the 7 days of ABX. Lab Results  Component Value Date   INR 2.60 09/26/2016   INR 2.2 (H) 09/08/2016   INR 1.9 (H) 08/05/2016    He does not workout. He denies chest pain, shortness of breath, dizziness.  He is on cholesterol medication and denies myalgias. His cholesterol is not at goal. The cholesterol last visit was:   Lab Results  Component Value Date   CHOL 163 09/08/2016   HDL 34 (L) 09/08/2016   LDLCALC 104 (H) 09/08/2016   TRIG 126 09/08/2016   CHOLHDL 4.8 09/08/2016    He has been working on diet and exercise for prediabetes, and denies polydipsia, polyuria and visual disturbances. Last A1C in the office was:  Lab Results  Component Value Date   HGBA1C 5.3 09/08/2016   Patient is on Vitamin D supplement.   Lab Results  Component Value Date   VD25OH 76 09/08/2016     BMI is Body mass index is 37.24 kg/m., he is working on diet and exercise. Wt Readings from Last 3 Encounters:  10/13/16 237 lb 12.8 oz (107.9 kg)  09/30/16 230 lb (104.3 kg)  09/08/16 224 lb 3.2 oz (101.7 kg)   He is on thyroid medication. His medication was changed last visit, T/T 1.5 and 1 pills the rest of the day. .   Lab Results  Component Value Date   TSH 5.29 (H) 09/08/2016  .  Patient is on allopurinol for gout and does not report a recent flare.  Lab Results  Component Value Date   LABURIC 7.9 09/08/2016   Medication Review: Current Outpatient Prescriptions on File Prior to Visit  Medication Sig Dispense Refill  . allopurinol (ZYLOPRIM) 300 MG tablet TAKE 1 TABLET BY MOUTH DAILY. TO PREVENT GOUT 90 tablet 1  . amiodarone (PACERONE) 200 MG tablet TAKE 1 TABLET (200 MG TOTAL) BY MOUTH DAILY. 30 tablet 6  . aspirin EC 81 MG tablet Take 81 mg by mouth daily.    . bumetanide  (BUMEX) 2 MG tablet Take 1/2 tablet daily as needed for swelling (Patient taking differently: Take 2 mg by mouth every Monday, Wednesday, and Friday. ) 30 tablet 6  . Cholecalciferol (VITAMIN D3) 5000 units TABS Take 10,000 Units by mouth daily.    . clotrimazole-betamethasone (LOTRISONE) cream Apply 1 application topically daily as needed (rash/ skin irritation).    . fenofibrate (TRICOR) 145 MG tablet TAKE 1 TABLET DAILY FOR BLOOD FATS 90 tablet 3  . isosorbide mononitrate (IMDUR) 30 MG 24 hr tablet TAKE 1 TABLET BY MOUTH EVERY DAY 90 tablet 1  . levothyroxine (SYNTHROID, LEVOTHROID) 50 MCG tablet TAKE 1 TABLET BY MOUTH EVERY DAY (Patient taking differently: TAKE 1 1/2 TABLETS BY MOUTH TUESDAY AND THURSDAY, TAKE 1 TABLET ON ALL OTHER DAYS - IN THE MORNING) 90 tablet 1  . metroNIDAZOLE (FLAGYL) 500 MG tablet Take 1 tablet (500 mg total) by mouth 2 (two) times daily. 14 tablet 0  . montelukast (SINGULAIR) 10 MG tablet TAKE 1 TABLET (10 MG TOTAL) BY MOUTH DAILY. [place]  tablet 2  . NITROSTAT 0.4 MG SL tablet PLACE 1 TABLET (0.4 MG TOTAL) UNDER THE TONGUE EVERY 5 (FIVE) MINUTES AS NEEDED FOR CHEST PAIN. 25 tablet 5  . pravastatin (PRAVACHOL) 40 MG tablet TAKE 1 TABLET (40 MG TOTAL) BY MOUTH EVERY MORNING. 90 tablet 1  . PRESCRIPTION MEDICATION Inhale into the lungs See admin instructions. Use CPAP whenever sleeping    . sertraline (ZOLOFT) 50 MG tablet TAKE 1 TABLET (50 MG TOTAL) BY MOUTH DAILY. 90 tablet 1  . verapamil (CALAN) 80 MG tablet TAKE 1 TABLET (80 MG TOTAL) BY MOUTH 2 (TWO) TIMES DAILY. 180 tablet 1  . vitamin B-12 (CYANOCOBALAMIN) 1000 MCG tablet Take 1,000 mcg by mouth daily.    [name] warfarin (COUMADIN) 5 MG tablet Take 1 tablet daily or as directed by your doctor. (Patient taking differently: Take 2.5-5 mg by mouth See admin instructions. Take 1/2 tablet (2.5 mg) times 3 days and 1 tablet ( 5 mg) times 4 days) 90 tablet 3   No current facility-administered medications on file prior to visit.      Current Problems (verified) Patient Active Problem List   Diagnosis Date Noted  . Chronic obstructive pulmonary disease ([place]) 09/08/2016  . Encounter for general adult medical examination with abnormal findings 06/02/2016  . Medicare annual wellness visit, subsequent 08/18/2015  . Ventricular tachycardia (paroxysmal) ([place]) 06/11/2015  . Tobacco use disorder 05/15/2015  . Morbid obesity  (BMI 35.57) 05/15/2015  . Poor compliance 02/10/2015  . Hypothyroidism 10/07/2014  . Generalized anxiety disorder 10/03/2014  . Status post mechanical aortic valve replacement 09/02/2014  . Medication management 02/21/2014  . Vitamin D deficiency 02/21/2014  . Long term current use of anticoagulant therapy 01/16/2014  . Permanent atrial fibrillation ([place]) 12/04/2013  . 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 Peripheral arterial disease ([place]) 12/02/2013  . Kidney cysts   . GERD (gastroesophageal reflux disease)   . Gout   . OSA and COPD overlap syndrome ([place])   . AAA (abdominal aortic aneurysm) ([place])   . Vitamin B deficiency   . Renal artery stenosis ([place]) 05/01/2013  . PAD,s/p aorto bifem BG, s/p Rt Fem PTA 3/15 04/06/2013  . COLONIC POLYPS 12/26/2007  . Hyperlipidemia 12/26/2007  . Essential hypertension 12/26/2007  . Coronary atherosclerosis 12/26/2007  . ESOPHAGEAL STRICTURE 12/26/2007    Screening Tests Immunization History  Administered Date(s) Administered  . DT 01/25/2012  . Influenza Split 06/28/2013  . Influenza, High Dose Seasonal PF 08/02/2014, 07/17/2015, 07/05/2016  . Pneumococcal Conjugate-13 03/25/2014  . Pneumococcal Polysaccharide-23 08/11/2012  . Zoster 10/30/2010   Preventative care: Last colonoscopy: 2008 Stress test 2012 Echo 05/2016 VAS [place] 08/2015 Renal US 04/2013 Aorta US 09/2016 CXR 11/2015 CT chest 11/2013  Prior vaccinations: TD or Tdap: 2013  Influenza: 2017 Pneumococcal: 2013 Prevnar13: 2015 Shingles/Zostavax:  2012  Names of Other Physician/Practitioners you currently use: 1. [facility] here for primary care 2. Doesn't see one, eye doctor, last visit  3. Doesn't see one, dentist, last visit  Patient Care Team: Unk Pinto, MD as PCP - General (Internal Medicine) Pixie Casino, MD as Consulting Physician (Cardiology) Inda Castle, MD as Consulting Physician (Gastroenterology) Lorretta Harp, MD as Consulting Physician (Cardiology) Elmarie Shiley, MD as Consulting Physician (Nephrology) Rana Snare, MD as Consulting Physician (Urology)  Allergies Allergies  Allergen Reactions  . Chantix [Varenicline] Nausea And Vomiting  . Lyrica [Pregabalin]     Swelling, couldn't breathe  .  Nsaids Other (See Comments)    Cannot take while on coumadin  . Simvastatin     Pt said he had a "bleed out" after taking it  . Ziac [Bisoprolol-Hydrochlorothiazide]     fatigue    SURGICAL HISTORY He  has a past surgical history that includes stents in kidneys; Coronary artery bypass graft (2000); shunts in femoral arteries; Cardioversion (8/282012); Aortic valve replacement; Femoral bypass; Cardiac catheterization (11/25/2008); lower extremity angiogram (N/A, 12/03/2013); and lower extremity angiogram (N/A, 01/07/2014). FAMILY HISTORY His family history includes Cancer in his brother; Heart attack in his father and mother; Kidney cancer in his sister; Kidney disease in his sister; Lung cancer in his sister; Stroke in his brother. SOCIAL HISTORY He  reports that he has been smoking Cigarettes.  He has a 2.00 pack-year smoking history. He has never used smokeless tobacco. He reports that he does not drink alcohol or use drugs.  MEDICARE WELLNESS OBJECTIVES: Physical activity: Current Exercise Habits: The patient does not participate in regular exercise at present Cardiac risk factors: Cardiac Risk Factors include: advanced age (>21men, >68 women);diabetes  mellitus;dyslipidemia;male gender;smoking/ tobacco exposure;sedentary lifestyle;hypertension;family history of premature cardiovascular disease;obesity (BMI >30kg/m2) Depression/mood screen:   Depression screen [facility] 2/9 10/13/2016  Decreased Interest 0  Down, Depressed, Hopeless 0  PHQ - 2 Score 0  Altered sleeping -  Tired, decreased energy -  Change in appetite -  Feeling bad or failure about yourself  -  Trouble concentrating -  Moving slowly or fidgety/restless -  Suicidal thoughts -  PHQ-9 Score -  Difficult doing work/chores -    ADLs:  In your present state of health, do you have any difficulty performing the following activities: 10/13/2016 09/09/2016  Hearing? Tempie Donning  Vision? N N  Difficulty concentrating or making decisions? N N  Walking or climbing stairs? Y N  Dressing or bathing? N N  Doing errands, shopping? Y N  Preparing Food and eating ? - -  Using the Toilet? - -  In the past six months, have you accidently leaked urine? - -  Do you have problems with loss of bowel control? - -  Managing your Medications? - -  Managing your Finances? - -  Housekeeping or managing your Housekeeping? - -  Some recent data might be hidden     Cognitive Testing  Alert? Yes  Normal Appearance?Yes  Oriented to person? Yes  Place? Yes   Time? Yes  Recall of three objects?  Yes  Can perform simple calculations? Yes  Displays appropriate judgment?Yes  Can read the correct time from a watch face?Yes  EOL planning: Does Patient Have a Medical Advance Directive?: Yes Type of Advance Directive: Healthcare Power of Attorney, Living will Copy of [place] in Chart?: No - copy requested   Objective:   Today's Vitals   10/13/16 1116  BP: 132/76  Pulse: 66  Resp: 16  Temp: 98.4 F (36.9 C)  SpO2: 97%  Weight: 237 lb 12.8 oz (107.9 kg)  Height: 5\' 7"  (1.702 m)  PainSc: 0-No pain   Body mass index is 37.24 kg/m. General Appearance: Well developed well  nourished, non-toxic appearing in no apparent distress. Eyes: PERRLA, EOMs, conjunctiva w/ no swelling or erythema or discharge Sinuses: No Frontal/maxillary tenderness ENT/Mouth: Ear canals clear without swelling or erythema.  TM's normal bilaterally with no retractions, bulging, or loss of landmarks., decreased hearing Neck: Supple, thyroid normal, no notable JVD  Respiratory: Respiratory effort mildly increased, Clear but distant breath  sounds anteriorly and posteriorly bilaterally without rales, rhonchi, or stridor.  There is the occasional end expiratory wheeze. No retractions or accessory muscle usage. Cardio: Ireg ireg with systolic mechanical click RSB, 2 + edema bilateral legs Abdomen: Soft, + BS.  Non tender, no guarding, rebound, hernias, masses.  Musculoskeletal: Full ROM, 5/5 strength, normal gait.  Skin: Warm, dry without rashes, healing ecchymosis bilateral arms Neuro: Awake and oriented X 3, Cranial nerves intact. Normal muscle tone, no cerebellar symptoms.  Psych: normal affect, Insight and Judgment appropriate.   Medicare Attestation I have personally reviewed: The patient's medical and social history Their use of alcohol, tobacco or illicit drugs Their current medications and supplements The patient's functional ability including ADLs,fall risks, home safety risks, cognitive, and hearing and visual impairment Diet and physical activities Evidence for depression or mood disorders  The patient's weight, height, BMI, and visual acuity have been recorded in the chart.  I have made referrals, counseling, and provided education to the patient based on review of the above and I have provided the patient with a written personalized care plan for preventive services.     Vicie Mutters, PA-C   10/13/2016

## 2016-10-14 LAB — MAGNESIUM: MAGNESIUM: 2.2 mg/dL (ref 1.5–2.5)

## 2016-10-14 LAB — BASIC METABOLIC PANEL WITH GFR
BUN: 43 mg/dL — AB (ref 7–25)
CO2: 25 mmol/L (ref 20–31)
CREATININE: 2.7 mg/dL — AB (ref 0.70–1.18)
Calcium: 8.8 mg/dL (ref 8.6–10.3)
Chloride: 102 mmol/L (ref 98–110)
GFR, EST AFRICAN AMERICAN: 26 mL/min — AB (ref >=60)
GFR, Est Non African American: 22 mL/min — ABNORMAL LOW (ref >=60)
GLUCOSE: 100 mg/dL — AB (ref 65–99)
POTASSIUM: 4 mmol/L (ref 3.5–5.3)
Sodium: 141 mmol/L (ref 135–146)

## 2016-10-14 LAB — BRAIN NATRIURETIC PEPTIDE: Brain Natriuretic Peptide: 379.8 pg/mL — ABNORMAL HIGH (ref <100)

## 2016-10-15 NOTE — Progress Notes (Signed)
Spoke with pt's son Roselyn Reef & we went over the instruction & son wrote down the instructions. Pt was then transferred to front office for follow up 1 week. Pt's son was made aware of lab results & voiced understanding of those results.

## 2016-10-25 ENCOUNTER — Encounter: Payer: Self-pay | Admitting: Internal Medicine

## 2016-10-25 ENCOUNTER — Ambulatory Visit (INDEPENDENT_AMBULATORY_CARE_PROVIDER_SITE_OTHER): Payer: PPO | Admitting: Internal Medicine

## 2016-10-25 VITALS — BP 122/60 | HR 62 | Temp 98.0°F | Resp 16 | Ht 67.0 in | Wt 232.0 lb

## 2016-10-25 DIAGNOSIS — Z7901 Long term (current) use of anticoagulants: Secondary | ICD-10-CM | POA: Diagnosis not present

## 2016-10-25 DIAGNOSIS — Z952 Presence of prosthetic heart valve: Secondary | ICD-10-CM | POA: Diagnosis not present

## 2016-10-25 DIAGNOSIS — Z79899 Other long term (current) drug therapy: Secondary | ICD-10-CM

## 2016-10-25 DIAGNOSIS — I482 Chronic atrial fibrillation: Secondary | ICD-10-CM

## 2016-10-25 DIAGNOSIS — R0602 Shortness of breath: Secondary | ICD-10-CM

## 2016-10-25 DIAGNOSIS — I4821 Permanent atrial fibrillation: Secondary | ICD-10-CM

## 2016-10-25 LAB — PROTIME-INR
INR: 4.7 — AB
PROTHROMBIN TIME: 46.5 s — AB (ref 9.0–11.5)

## 2016-10-25 NOTE — Progress Notes (Signed)
Patient ID: Robert Stanton, male   DOB: June 14, 1941, 76 y.o.   MRN: [id number]  Assessment and Plan:   1. Permanent atrial fibrillation (HCC) -cont rate control -cont coumadin -dose adjust coumadin as needed   2. Status post mechanical aortic valve replacement -cont coumadin -goal INR 2.5-3.5  4. Long term current use of anticoagulant therapy -cont coumadin -dose adjust if necessary - Protime-INR  5. Shortness of breath -related to acute on chronic heart failure -cont daily bumex -if still elevated BNP consider increase to 1 tablet BID x 2-3 days - Brain natriuretic peptide [id number]  6. Medication management -check potassium due to diuresis - BASIC METABOLIC PANEL WITH GFR    HPI 76 y.o.male presents for 1 week follow up of CHF exacerbation.  He reports that he is currently doing much better than last week.  He reports that he is currently taking 1 tablet of bumex today.  His weight is down 5 lbs on his home scales.  He reports no dizziness, lightheadedness, muscle cramps, CP, or SOB.  No PND or orthopnea.   He notes that he has been having some dull headaches.  He notes that he can close his eyes and lay flat on his back and then that feels better.  He has been more anxious lately per his own report.   He is worried about his wife.  He reports that she is not doing as well as he would like her to.  He reports that she gets exacerbated on her way here.  That is why she didn't come.  He does not get relief from tylenol.  He says he feels drowsy with it.  He does drink something helps as well.      Past Medical History:  Diagnosis Date  . AAA (abdominal aortic aneurysm) ([place])   . CAD (coronary artery disease)    a. CABG '00. b. all SVGs occluded 2010. c. low risk Nuc 2012   . Cataracts, bilateral   . Chronic anticoagulation   . CKD (chronic kidney disease), stage III   . COPD (chronic obstructive pulmonary disease) ([place])   . GERD (gastroesophageal reflux disease)   .  Gout   . Hiatal hernia   . History of Doppler ultrasound 04/05/2013   LEAs; small distal abd. aoritc aneuysm is stable; fem-pop graft to R leg has 50-69% blockage  . History of echocardiogram 04/17/2013   EF 40-45%; LV hypertrophy; LV mild-mod dilated; AV regurg.; LA severely diated, RA mildly dilated  . History of nuclear stress test 05/14/2011   Dipyridamole; moderate perfusion defect in Basal Infrioer & Mid Inferior region - consistent w/infarct or scar; global LV systolic function mildly reduced; EKG negative for ischemia; low risk scan  . Hyperlipidemia   . Hypertension   . Ischemic cardiomyopathy   . Kidney cysts   . Mechanical heart valve present    a. Hx mechanical St Jude AVR 2000.  [name] OSA (obstructive sleep apnea)   . Peripheral arterial disease ([place])    a. s/p aorto bifem BG;  b. 11/2013 PVA distal R Fem-fem stenosis;  c. 12/2013 PTA of dist R femoral bypass graft.  . Permanent atrial fibrillation ([place])   . Prediabetes   . Ventricular tachycardia (paroxysmal) ([place])    Seen by Dr. Lovena Le with EP, started on amiodarone  . Vitamin B deficiency      Allergies  Allergen Reactions  . Chantix [Varenicline] Nausea And Vomiting  . Lyrica [Pregabalin]     Swelling, couldn't  breathe  . Nsaids Other (See Comments)    Cannot take while on coumadin  . Simvastatin     Pt said he had a "bleed out" after taking it  . Ziac [Bisoprolol-Hydrochlorothiazide]     fatigue      Current Outpatient Prescriptions on File Prior to Visit  Medication Sig Dispense Refill  . allopurinol (ZYLOPRIM) 300 MG tablet TAKE 1 TABLET BY MOUTH DAILY. TO PREVENT GOUT 90 tablet 1  . amiodarone (PACERONE) 200 MG tablet TAKE 1 TABLET (200 MG TOTAL) BY MOUTH DAILY. 30 tablet 6  . aspirin EC 81 MG tablet Take 81 mg by mouth daily.    . bumetanide (BUMEX) 2 MG tablet Take 1/2 tablet daily as needed for swelling (Patient taking differently: Take 2 mg by mouth every Monday, Wednesday, and Friday. ) 30 tablet 6  .  Cholecalciferol (VITAMIN D3) 5000 units TABS Take 10,000 Units by mouth daily.    . clotrimazole-betamethasone (LOTRISONE) cream Apply 1 application topically daily as needed (rash/ skin irritation).    . fenofibrate (TRICOR) 145 MG tablet TAKE 1 TABLET DAILY FOR BLOOD FATS 90 tablet 3  . isosorbide mononitrate (IMDUR) 30 MG 24 hr tablet TAKE 1 TABLET BY MOUTH EVERY DAY 90 tablet 1  . levothyroxine (SYNTHROID, LEVOTHROID) 50 MCG tablet TAKE 1 TABLET BY MOUTH EVERY DAY (Patient taking differently: TAKE 1 1/2 TABLETS BY MOUTH TUESDAY AND THURSDAY, TAKE 1 TABLET ON ALL OTHER DAYS - IN THE MORNING) 90 tablet 1  . metroNIDAZOLE (FLAGYL) 500 MG tablet Take 1 tablet (500 mg total) by mouth 2 (two) times daily. 14 tablet 0  . montelukast (SINGULAIR) 10 MG tablet TAKE 1 TABLET (10 MG TOTAL) BY MOUTH DAILY. 30 tablet 2  . NITROSTAT 0.4 MG SL tablet PLACE 1 TABLET (0.4 MG TOTAL) UNDER THE TONGUE EVERY 5 (FIVE) MINUTES AS NEEDED FOR CHEST PAIN. 25 tablet 5  . pravastatin (PRAVACHOL) 40 MG tablet TAKE 1 TABLET (40 MG TOTAL) BY MOUTH EVERY MORNING. 90 tablet 1  . PRESCRIPTION MEDICATION Inhale into the lungs See admin instructions. Use CPAP whenever sleeping    . sertraline (ZOLOFT) 50 MG tablet TAKE 1 TABLET (50 MG TOTAL) BY MOUTH DAILY. 90 tablet 1  . verapamil (CALAN) 80 MG tablet TAKE 1 TABLET (80 MG TOTAL) BY MOUTH 2 (TWO) TIMES DAILY. 180 tablet 1  . vitamin B-12 (CYANOCOBALAMIN) 1000 MCG tablet Take 1,000 mcg by mouth daily.    [name] warfarin (COUMADIN) 5 MG tablet Take 1 tablet daily or as directed by your doctor. (Patient taking differently: Take 2.5-5 mg by mouth See admin instructions. Take 1/2 tablet (2.5 mg) times 3 days and 1 tablet ( 5 mg) times 4 days) 90 tablet 3   No current facility-administered medications on file prior to visit.     ROS: all negative except above.   Physical Exam: There were no vitals filed for this visit. There were no vitals taken for this visit.   Wt Readings from Last  3 Encounters:  10/25/16 232 lb (105.2 kg)  10/13/16 237 lb 12.8 oz (107.9 kg)  09/30/16 230 lb (104.3 kg)    General Appearance: Disheveled appearing, smells of cigarrettes, Well developed well nourished, non-toxic appearing in no apparent distress. Eyes: PERRLA, EOMs, conjunctiva w/ no swelling or erythema or discharge Sinuses: No Frontal/maxillary tenderness ENT/Mouth: Ear canals clear without swelling or erythema.  TM's normal bilaterally with no retractions, bulging, or loss of landmarks.   Neck: Supple, thyroid normal, no notable JVD  Respiratory: Respiratory effort normal, Clear breath sounds anteriorly and posteriorly bilaterally without rales, rhonchi, wheezing or stridor. No retractions or accessory muscle usage. Cardio: RRR with no clicking heard best over the 4 LICS, RGs.  There is trace peripheral edema bilaterally.   Abdomen: Soft, + BS.  Non tender, no guarding, rebound, hernias, masses.  Musculoskeletal: Full ROM, 5/5 strength, normal gait.  Skin: Ecchymosis to the bilateral arms, Warm, dry without rashes  Neuro: Awake and oriented X 3, Cranial nerves intact. Normal muscle tone, no cerebellar symptoms. Sensation intact.  Psych: normal affect, Insight and Judgment appropriate.     [name], PA-C 2:21 PM [facility]

## 2016-10-26 LAB — BASIC METABOLIC PANEL WITH GFR
BUN: 45 mg/dL — ABNORMAL HIGH (ref 7–25)
CO2: 32 mmol/L — ABNORMAL HIGH (ref 20–31)
Calcium: 8.7 mg/dL (ref 8.6–10.3)
Chloride: 99 mmol/L (ref 98–110)
Creat: 2.74 mg/dL — ABNORMAL HIGH (ref 0.70–1.18)
GFR, EST AFRICAN AMERICAN: 25 mL/min — AB (ref >=60)
GFR, EST NON AFRICAN AMERICAN: 22 mL/min — AB (ref >=60)
Glucose, Bld: 109 mg/dL — ABNORMAL HIGH (ref 65–99)
Potassium: 3.9 mmol/L (ref 3.5–5.3)
SODIUM: 141 mmol/L (ref 135–146)

## 2016-10-26 LAB — BRAIN NATRIURETIC PEPTIDE: BRAIN NATRIURETIC PEPTIDE: 337.5 pg/mL — AB (ref <100)

## 2016-11-02 ENCOUNTER — Other Ambulatory Visit: Payer: Self-pay | Admitting: Physician Assistant

## 2016-11-07 ENCOUNTER — Other Ambulatory Visit: Payer: Self-pay | Admitting: Physician Assistant

## 2016-11-09 ENCOUNTER — Ambulatory Visit (INDEPENDENT_AMBULATORY_CARE_PROVIDER_SITE_OTHER): Payer: PPO | Admitting: Physician Assistant

## 2016-11-09 ENCOUNTER — Encounter: Payer: Self-pay | Admitting: Physician Assistant

## 2016-11-09 VITALS — BP 126/80 | HR 86 | Temp 97.5°F | Resp 16 | Ht 67.0 in | Wt 227.4 lb

## 2016-11-09 DIAGNOSIS — I739 Peripheral vascular disease, unspecified: Secondary | ICD-10-CM | POA: Diagnosis not present

## 2016-11-09 DIAGNOSIS — I129 Hypertensive chronic kidney disease with stage 1 through stage 4 chronic kidney disease, or unspecified chronic kidney disease: Secondary | ICD-10-CM

## 2016-11-09 DIAGNOSIS — E1122 Type 2 diabetes mellitus with diabetic chronic kidney disease: Secondary | ICD-10-CM | POA: Diagnosis not present

## 2016-11-09 DIAGNOSIS — N184 Chronic kidney disease, stage 4 (severe): Secondary | ICD-10-CM | POA: Diagnosis not present

## 2016-11-09 DIAGNOSIS — I482 Chronic atrial fibrillation: Secondary | ICD-10-CM | POA: Diagnosis not present

## 2016-11-09 DIAGNOSIS — J449 Chronic obstructive pulmonary disease, unspecified: Secondary | ICD-10-CM

## 2016-11-09 DIAGNOSIS — I714 Abdominal aortic aneurysm, without rupture, unspecified: Secondary | ICD-10-CM

## 2016-11-09 DIAGNOSIS — I4821 Permanent atrial fibrillation: Secondary | ICD-10-CM

## 2016-11-09 NOTE — Progress Notes (Signed)
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 - BASIC METABOLIC PANEL WITH GFR   Permanent atrial fibrillation (HCC) Rate controlled, continue coumadin, GOAL 2.5-3.5 - Protime-INR - CBC with Differential/Platelet  Peripheral arterial disease (HCC) Control blood pressure, cholesterol, glucose, increase exercise.  Advised to quit smoking  Abdominal aortic aneurysm (AAA) without rupture (HCC) Control blood pressure, cholesterol, glucose, increase exercise.  Advised to quit smoking  Chronic obstructive pulmonary disease, unspecified COPD type ([place]) Advised to stop smoking, continue meds.   Type 2 diabetes mellitus with stage 4 chronic kidney disease, without long-term current use of insulin ([place]) Discussed general issues about diabetes pathophysiology and management., Educational material distributed., Suggested low cholesterol diet., Encouraged aerobic exercise., Discussed foot care., Reminded to get yearly retinal exam.   Tobacco use disorder Patient is not interested at this time.   Future Appointments Date Time Provider [place]  11/22/2016 2:30 PM Pixie Casino, MD [facility]  12/13/2016 3:30 PM Unk Pinto, MD [facility] None  07/06/2017 10:00 AM Unk Pinto, MD [facility] None    HPI 76 y.o.male presents for 1 month follow up of long term coumadin use for chronic atrial fibrillation. Patient reports that they have been doing well.  He is not having severe, bruising, bleeding, epistaxis, melena, or hematochezia.  He does take his coumadin daily. Last visit he held his coumadin x 3 days and then decreased to 2.5 mg 3 days a week and 5 mg 4 days a week.  He denies any recent falls.  He has CHF, his weight  is down, SOB is at baseline, denies any recent CP, no NTG. He is on bumex 1 pill daily, 2 mg.  Lab Results  Component Value Date   INR 4.7 (H) 10/25/2016   INR 2.0 (H) 10/13/2016   INR 2.60 09/26/2016   Wt Readings from Last 3 Encounters:  11/09/16 227 lb 6.4 oz (103.1 kg)  10/25/16 232 lb (105.2 kg)  10/13/16 237 lb 12.8 oz (107.9 kg)    Past Medical History:  Diagnosis Date  . AAA (abdominal aortic aneurysm) ([place])   . CAD (coronary artery disease)    a. CABG '00. b. all SVGs occluded 2010. c. low risk Nuc 2012   . Cataracts, bilateral   . Chronic anticoagulation   . CKD (chronic kidney disease), stage III   . COPD (chronic obstructive pulmonary disease) ([place])   . GERD (gastroesophageal reflux disease)   . Gout   . Hiatal hernia   . History of Doppler ultrasound 04/05/2013   LEAs; small distal abd. aoritc aneuysm is stable; fem-pop graft to R leg has 50-69% blockage  . History of echocardiogram 04/17/2013   EF 40-45%; LV hypertrophy; LV mild-mod dilated; AV regurg.; LA severely diated, RA mildly dilated  . History of nuclear stress test 05/14/2011   Dipyridamole; moderate perfusion defect in Basal Infrioer & Mid Inferior region - consistent w/infarct or scar; global LV systolic function mildly reduced; EKG negative for ischemia; low risk scan  . Hyperlipidemia   . Hypertension   . Ischemic cardiomyopathy   . Kidney cysts   . Mechanical heart valve present    a. Hx mechanical St Jude AVR 2000.  [name] OSA (obstructive sleep apnea)   . Peripheral arterial disease ([place])    a. s/p aorto bifem BG;  b. 11/2013 PVA distal R Fem-fem stenosis;  c. 12/2013 PTA of dist R femoral bypass graft.  . Permanent atrial fibrillation ([place])   . Prediabetes   . Ventricular tachycardia (paroxysmal) ([place])    Seen by Dr. Lovena Le with EP, started on amiodarone  . Vitamin B deficiency      Allergies  Allergen Reactions  . Chantix [Varenicline] Nausea And Vomiting  . Lyrica [Pregabalin]     Swelling,  couldn't breathe  . Nsaids Other (See Comments)    Cannot take while on coumadin  . Simvastatin     Pt said he had a "bleed out" after taking it  . Ziac [Bisoprolol-Hydrochlorothiazide]     fatigue      Current Outpatient Prescriptions on File Prior to Visit  Medication Sig Dispense Refill  . allopurinol (ZYLOPRIM) 300 MG tablet TAKE 1 TABLET BY MOUTH DAILY. TO PREVENT GOUT 90 tablet 1  . amiodarone (PACERONE) 200 MG tablet TAKE 1 TABLET (200 MG TOTAL) BY MOUTH DAILY. 30 tablet 6  . aspirin EC 81 MG tablet Take 81 mg by mouth daily.    . bumetanide (BUMEX) 2 MG tablet Take 1/2 tablet daily as needed for swelling 30 tablet 6  . Cholecalciferol (VITAMIN D3) 5000 units TABS Take 10,000 Units by mouth daily.    . clotrimazole-betamethasone (LOTRISONE) cream Apply 1 application topically daily as needed (rash/ skin irritation).    . fenofibrate (TRICOR) 145 MG tablet TAKE 1 TABLET DAILY FOR BLOOD FATS 90 tablet 3  . isosorbide mononitrate (IMDUR) 30 MG 24 hr tablet TAKE 1 TABLET BY MOUTH EVERY DAY 90 tablet 1  . levothyroxine (SYNTHROID, LEVOTHROID) 50 MCG tablet TAKE 1 TABLET BY MOUTH EVERY DAY 90 tablet 1  . montelukast (SINGULAIR) 10 MG tablet TAKE 1 TABLET (10 MG TOTAL) BY MOUTH DAILY. 30 tablet 2  . NITROSTAT 0.4 MG SL tablet PLACE 1 TABLET (0.4 MG TOTAL) UNDER THE TONGUE EVERY 5 (FIVE) MINUTES AS NEEDED FOR CHEST PAIN. 25 tablet 5  . pravastatin (PRAVACHOL) 40 MG tablet TAKE 1 TABLET (40 MG TOTAL) BY MOUTH EVERY MORNING. 90 tablet 1  . PRESCRIPTION MEDICATION Inhale into the lungs See admin instructions. Use CPAP whenever sleeping    . sertraline (ZOLOFT) 50 MG tablet TAKE 1 TABLET (50 MG TOTAL) BY MOUTH DAILY. 90 tablet 1  . verapamil (CALAN) 80 MG tablet TAKE 1 TABLET (80 MG TOTAL) BY MOUTH 2 (TWO) TIMES DAILY. 180 tablet 1  . vitamin B-12 (CYANOCOBALAMIN) 1000 MCG tablet Take 1,000 mcg by mouth daily.    [name] warfarin (COUMADIN) 5 MG tablet Take 1 tablet daily or as directed by your  doctor. (Patient taking differently: Take 2.5-5 mg by mouth See admin instructions. Take 1/2 tablet (2.5 mg) times 3 days and 1 tablet ( 5 mg) times 4 days) 90 tablet 3   No current facility-administered medications on file prior to visit.     ROS: all negative except above.   Physical Exam: Filed Weights   11/09/16 1537  Weight: 227 lb 6.4 oz (103.1 kg)   BP 126/80   Pulse 86   Temp 97.5 F (36.4 C)   Resp 16   Ht 5\' 7"  (1.702 m)   Wt 227 lb 6.4 oz (103.1 kg)   SpO2 98%   BMI 35.62 kg/m  General Appearance: Well developed well nourished, non-toxic appearing in no apparent distress. Eyes: PERRLA, EOMs, conjunctiva w/ no swelling or erythema or discharge Sinuses: No Frontal/maxillary tenderness ENT/Mouth: Ear canals clear  without swelling or erythema.  TM's normal bilaterally with no retractions, bulging, or loss of landmarks., decreased hearing Neck: Supple, thyroid normal, no notable JVD  Respiratory: Respiratory effort mildly increased, Clear but distant breath sounds anteriorly and posteriorly bilaterally without rales, rhonchi, or stridor.  There is the occasional end expiratory wheeze. No retractions or accessory muscle usage. Cardio: Ireg ireg with systolic mechanical click RSB Abdomen: Soft, + BS.  Non tender, no guarding, rebound, hernias, masses.  Musculoskeletal: Full ROM, 5/5 strength, normal gait.  Skin: Warm, dry without rashes, healing hematoma 2x4 inches left lower back Neuro: Awake and oriented X 3, Cranial nerves intact. Normal muscle tone, no cerebellar symptoms. S Psych: normal affect, Insight and Judgment appropriate.     Vicie Mutters, PA-C 3:47 PM [facility]

## 2016-11-10 LAB — CBC WITH DIFFERENTIAL/PLATELET
BASOS ABS: 61 {cells}/uL (ref 0–200)
Basophils Relative: 1 %
EOS PCT: 3 %
Eosinophils Absolute: 183 cells/uL (ref 15–500)
HCT: 35.9 % — ABNORMAL LOW (ref 38.5–50.0)
Hemoglobin: 11.2 g/dL — ABNORMAL LOW (ref 13.2–17.1)
Lymphocytes Relative: 17 %
Lymphs Abs: 1037 cells/uL (ref 850–3900)
MCH: 30 pg (ref 27.0–33.0)
MCHC: 31.2 g/dL — AB (ref 32.0–36.0)
MCV: 96.2 fL (ref 80.0–100.0)
MONOS PCT: 7 %
MPV: 10.3 fL (ref 7.5–12.5)
Monocytes Absolute: 427 cells/uL (ref 200–950)
NEUTROS ABS: 4392 {cells}/uL (ref 1500–7800)
Neutrophils Relative %: 72 %
PLATELETS: 197 10*3/uL (ref 140–400)
RBC: 3.73 MIL/uL — AB (ref 4.20–5.80)
RDW: 16.2 % — ABNORMAL HIGH (ref 11.0–15.0)
WBC: 6.1 10*3/uL (ref 3.8–10.8)

## 2016-11-10 LAB — BASIC METABOLIC PANEL WITH GFR
BUN: 49 mg/dL — AB (ref 7–25)
CHLORIDE: 95 mmol/L — AB (ref 98–110)
CO2: 30 mmol/L (ref 20–31)
CREATININE: 2.93 mg/dL — AB (ref 0.70–1.18)
Calcium: 8.9 mg/dL (ref 8.6–10.3)
GFR, Est African American: 23 mL/min — ABNORMAL LOW (ref >=60)
GFR, Est Non African American: 20 mL/min — ABNORMAL LOW (ref >=60)
Glucose, Bld: 157 mg/dL — ABNORMAL HIGH (ref 65–99)
POTASSIUM: 4.2 mmol/L (ref 3.5–5.3)
Sodium: 135 mmol/L (ref 135–146)

## 2016-11-10 LAB — PROTIME-INR
INR: 2.9 — AB
Prothrombin Time: 29.1 s — ABNORMAL HIGH (ref 9.0–11.5)

## 2016-11-19 ENCOUNTER — Other Ambulatory Visit: Payer: Self-pay | Admitting: Physician Assistant

## 2016-11-22 ENCOUNTER — Ambulatory Visit (INDEPENDENT_AMBULATORY_CARE_PROVIDER_SITE_OTHER): Payer: PPO | Admitting: Internal Medicine

## 2016-11-22 ENCOUNTER — Encounter: Payer: Self-pay | Admitting: Internal Medicine

## 2016-11-22 VITALS — BP 138/57 | HR 60 | Ht 68.0 in | Wt 230.0 lb

## 2016-11-22 DIAGNOSIS — D631 Anemia in chronic kidney disease: Secondary | ICD-10-CM | POA: Diagnosis not present

## 2016-11-22 DIAGNOSIS — I482 Chronic atrial fibrillation: Secondary | ICD-10-CM | POA: Diagnosis not present

## 2016-11-22 DIAGNOSIS — H9190 Unspecified hearing loss, unspecified ear: Secondary | ICD-10-CM

## 2016-11-22 DIAGNOSIS — I739 Peripheral vascular disease, unspecified: Secondary | ICD-10-CM

## 2016-11-22 DIAGNOSIS — R42 Dizziness and giddiness: Secondary | ICD-10-CM

## 2016-11-22 DIAGNOSIS — I4821 Permanent atrial fibrillation: Secondary | ICD-10-CM

## 2016-11-22 DIAGNOSIS — Z952 Presence of prosthetic heart valve: Secondary | ICD-10-CM | POA: Diagnosis not present

## 2016-11-22 DIAGNOSIS — I25110 Atherosclerotic heart disease of native coronary artery with unstable angina pectoris: Secondary | ICD-10-CM

## 2016-11-22 DIAGNOSIS — N184 Chronic kidney disease, stage 4 (severe): Secondary | ICD-10-CM | POA: Diagnosis not present

## 2016-11-22 NOTE — Patient Instructions (Addendum)
You have been referred to Dr. Benjamine Mola (ENT - ear, nose, throat)  Your physician wants you to follow-up in: 6 months with Dr. [name]. You will receive a reminder letter in the mail two months in advance. If you don't receive a letter, please call our office to schedule the follow-up appointment.

## 2016-11-22 NOTE — Progress Notes (Signed)
OFFICE NOTE  Chief Complaint:  Dizziness, hearing loss  Primary Care Physician: Alesia Richards, MD  HPI:  Robert Stanton is a 76 year old gentleman with a history of CABG in 2000 with a LIMA to the LAD. All vein grafts were noted to be occluded, and he had a St. Jude AVR which is notably functioning. EF is about 40-45% in 2012. He also has sleep apnea, COPD, and recurrent atrial fibrillation/flutter. He had cardioversion in 2012 but is back in atrial fibrillation, and I started him on amiodarone with the plan of cardioversion. He at his last visit actually declined cardioversion, however, we have kept him on amiodarone because it has seemed to benefit him as far as reducing multiple PVCs which were previously noted. Today he continues to have sinus bradycardia despite decreasing his Cardizem at the last visit from 180 mg to 120 mg daily. The heart rate remains around 48 with a significant intraventricular conduction delay suggesting underlying conduction disease. He reports a recent gout attack, and was given the lower. He's also had significant weight gain and lower extremity fluid retention. This could be due to worsening heart failure. He continues to be bradycardic, with interventricular conduction delay. I've been decreasing his amiodarone intake is about time that we discontinue it.  At his last office visit, I discontinued his amiodarone. We also increased his Bumex slightly and obtain an echocardiogram. The echocardiogram showed a stable EF of 40-45% with some mildly elevated filling pressures. Overall, there was no significant change from the prior study. 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 The renal cortex and medulla appeared bilaterally without hydronephrosis.  He does report some improvement in his swelling with the increased dose of Bumex. However, his shortness of breath is fairly stable without improvement.  He has  noted a sore that developed between the right fourth and fifth digits on the right foot. This appears to be an arterial ulcer. He did have arterial Dopplers performed in February of 2013. This showed a reduced ABI of 0.75 on the right and 1.1 on the left. The right SFA is occluded and the left SFA is occluded. There is a patent right to left fem-fem bypass graft. Right runoff was noted to be poor.  Robert Stanton follows up today from hospital. He was having some chest discomfort and was admitted this past summer with a wide complex tachycardia which is likely VT. He was seen by EP and not thought to be candidate for ablation or defibrillator due to multiple other medical problems, significant PAD and coronary artery disease with all of his bypass grafts which were occluded in 2010. This is wise not had further ischemic workup in addition he has significant chronic kidney disease. He was placed on amiodarone and this seems to have quieted some of those symptoms. He's maintaining atrial fibrillation, however which is likely permanent. He has some occasional chest discomfort which is relieved by nitroglycerin. Again I think his options are fairly limited with regards to revascularization given his poor renal function and other medical problems. He has had significant PAD however recently had repeat Dopplers which showed only about 30-40% stenosis which was a vessel that was intervened upon by Dr. Gwenlyn Found about 2 years ago.  04/12/2016  I saw Robert Stanton back in the office today for follow-up. He reports some intermittent chest discomfort taking nitroglycerin about one time a week. It seems to happen more at rest but not with exertion. He  works in his yard almost every day. He also does some rebuilding of equipment around the house. He continues to smoke unfortunately. He does have significant PAD but no symptoms of claudication. He reports some upper extremity arm pain when he raises his arms and sometimes is  associated with chest discomfort and relieved with nitroglycerin. He denies any further tachycardia arrhythmias. He had an episode in May where his INR was supratherapeutic and he had bleeding. Since then has been corrected and seems to be quite stable. He is now likely in persistent if not permanent atrial fibrillation on warfarin for which she also takes because of a mechanical aortic valve. He eventually had healing of an ulcer on his foot.  11/22/2016  Robert Stanton returns today for follow-up. Overall he feels fairly well. Recently he had a CHF exacerbation was treated by his primary care provider who increased his Bumex up to 2 mg but currently is only taking one half milligram as needed for swelling. He reported some recent dizziness, particular positional dizziness. Orthostatic blood pressures were checked in office today and were negative. He also has significant hearing loss, tinnitus and feeling of fluid in his ear. I suspect he may have an inner ear problem leading to his imbalance. Reports he does have a hearing aid but rarely uses it and it's more than 74 years old. EKG today shows A. fib with RVR. He is compliant with warfarin.  PMHx:  Past Medical History:  Diagnosis Date  . AAA (abdominal aortic aneurysm) ([place])   . CAD (coronary artery disease)    a. CABG '00. b. all SVGs occluded 2010. c. low risk Nuc 2012   . Cataracts, bilateral   . Chronic anticoagulation   . CKD (chronic kidney disease), stage III   . COPD (chronic obstructive pulmonary disease) ([place])   . GERD (gastroesophageal reflux disease)   . Gout   . Hiatal hernia   . History of Doppler ultrasound 04/05/2013   LEAs; small distal abd. aoritc aneuysm is stable; fem-pop graft to R leg has 50-69% blockage  . History of echocardiogram 04/17/2013   EF 40-45%; LV hypertrophy; LV mild-mod dilated; AV regurg.; LA severely diated, RA mildly dilated  . History of nuclear stress test 05/14/2011   Dipyridamole; moderate perfusion  defect in Basal Infrioer & Mid Inferior region - consistent w/infarct or scar; global LV systolic function mildly reduced; EKG negative for ischemia; low risk scan  . Hyperlipidemia   . Hypertension   . Ischemic cardiomyopathy   . Kidney cysts   . Mechanical heart valve present    a. Hx mechanical St Jude AVR 2000.  [name] OSA (obstructive sleep apnea)   . Peripheral arterial disease ([place])    a. s/p aorto bifem BG;  b. 11/2013 PVA distal R Fem-fem stenosis;  c. 12/2013 PTA of dist R femoral bypass graft.  . Permanent atrial fibrillation ([place])   . Prediabetes   . Ventricular tachycardia (paroxysmal) ([place])    Seen by Dr. Lovena Le with EP, started on amiodarone  . Vitamin B deficiency     Past Surgical History:  Procedure Laterality Date  . AORTIC VALVE REPLACEMENT     St. Jude  . CARDIAC CATHETERIZATION  11/25/2008   loss of 2/3 (RCA, OM) bypass grafts with patent internal mammary artery to LAD; large collateral filling of RCA ; osteal narrowing of circumflex of ~50%  . CARDIOVERSION  8/282012  . CORONARY ARTERY BYPASS GRAFT  2000   3 vessel; LIMA to LAD;  RCA, OM  . FEMORAL BYPASS    . LOWER EXTREMITY ANGIOGRAM N/A 12/03/2013   Procedure: LOWER EXTREMITY ANGIOGRAM;  Surgeon: Lorretta Harp, MD;  Location: [facility] CATH LAB;  Service: Cardiovascular;  Laterality: N/A;  . LOWER EXTREMITY ANGIOGRAM N/A 01/07/2014   Procedure: LOWER EXTREMITY ANGIOGRAM;  Surgeon: Lorretta Harp, MD;  Location: [facility] CATH LAB;  Service: Cardiovascular;  Laterality: N/A;  . shunts in femoral arteries    . stents in kidneys      FAMHx:  Family History  Problem Relation Age of Onset  . Heart attack Mother   . Heart attack Father   . Lung cancer Sister     x 2  . Kidney cancer Sister   . Kidney disease Sister   . Cancer Brother     ?  . Stroke Brother     SOCHx:   reports that he has been smoking Cigarettes.  He has a 2.00 pack-year smoking history. He has never used smokeless tobacco. He reports that he does  not drink alcohol or use drugs.  ALLERGIES:  Allergies  Allergen Reactions  . Chantix [Varenicline] Nausea And Vomiting  . Lyrica [Pregabalin]     Swelling, couldn't breathe  . Nsaids Other (See Comments)    Cannot take while on coumadin  . Simvastatin     Pt said he had a "bleed out" after taking it  . Ziac [Bisoprolol-Hydrochlorothiazide]     fatigue    ROS: Pertinent items noted in HPI and remainder of comprehensive ROS otherwise negative.  HOME MEDS: Current Outpatient Prescriptions  Medication Sig Dispense Refill  . allopurinol (ZYLOPRIM) 300 MG tablet TAKE 1 TABLET BY MOUTH DAILY. TO PREVENT GOUT 90 tablet 1  . amiodarone (PACERONE) 200 MG tablet TAKE 1 TABLET (200 MG TOTAL) BY MOUTH DAILY. 30 tablet 6  . aspirin EC 81 MG tablet Take 81 mg by mouth daily.    . bumetanide (BUMEX) 2 MG tablet Take 1/2 tablet daily as needed for swelling 30 tablet 6  . Cholecalciferol (VITAMIN D3) 5000 units TABS Take 10,000 Units by mouth daily.    . clotrimazole-betamethasone (LOTRISONE) cream Apply 1 application topically daily as needed (rash/ skin irritation).    . fenofibrate (TRICOR) 145 MG tablet TAKE 1 TABLET DAILY FOR BLOOD FATS 90 tablet 3  . isosorbide mononitrate (IMDUR) 30 MG 24 hr tablet TAKE 1 TABLET BY MOUTH EVERY DAY 90 tablet 1  . levothyroxine (SYNTHROID, LEVOTHROID) 50 MCG tablet TAKE 1 TABLET BY MOUTH EVERY DAY 90 tablet 1  . montelukast (SINGULAIR) 10 MG tablet TAKE 1 TABLET (10 MG TOTAL) BY MOUTH DAILY. 90 tablet 1  . NITROSTAT 0.4 MG SL tablet PLACE 1 TABLET (0.4 MG TOTAL) UNDER THE TONGUE EVERY 5 (FIVE) MINUTES AS NEEDED FOR CHEST PAIN. 25 tablet 5  . pravastatin (PRAVACHOL) 40 MG tablet TAKE 1 TABLET (40 MG TOTAL) BY MOUTH EVERY MORNING. 90 tablet 1  . PRESCRIPTION MEDICATION Inhale into the lungs See admin instructions. Use CPAP whenever sleeping    . sertraline (ZOLOFT) 50 MG tablet TAKE 1 TABLET (50 MG TOTAL) BY MOUTH DAILY. 90 tablet 1  . verapamil (CALAN) 80 MG  tablet TAKE 1 TABLET (80 MG TOTAL) BY MOUTH 2 (TWO) TIMES DAILY. 180 tablet 1  . vitamin B-12 (CYANOCOBALAMIN) 1000 MCG tablet Take 1,000 mcg by mouth daily.    [name] warfarin (COUMADIN) 5 MG tablet Take 1 tablet daily or as directed by your doctor. (Patient taking differently: Take 2.5-5 mg  by mouth See admin instructions. Take 1/2 tablet (2.5 mg) times 3 days and 1 tablet ( 5 mg) times 4 days) 90 tablet 3   No current facility-administered medications for this visit.     LABS/IMAGING: No results found for this or any previous visit (from the past 48 hour(s)). No results found.  VITALS: BP (!) 138/57 (BP Location: Right Arm, Cuff Size: Normal)   Pulse 60   Ht 5\' 8"  (1.727 m)   Wt 230 lb (104.3 kg)   BMI 34.97 kg/m   EXAM: General appearance: alert and no distress Lungs: clear to auscultation bilaterally Heart: irregularly irregular rhythm Extremities: extremities normal, atraumatic, no cyanosis or edema Neurologic: Mental status: Alert, oriented, thought content appropriate, very hard of hearing  EKG: Atrial fibrillation with slow ventricular response at 57  ASSESSMENT: 1. Atrial fibrillation with slow ventricular response 2. Weight gain and lower extremity edema 3. Ischemic cardiomyopathy EF 40-45% - status post CABG with occluded vein grafts and 2010 4. Aortic stenosis status post St. Jude aortic valve replacement 5. 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 Large bilateral femoropopliteal grafts, without any sign of aneurysm, and preserved ABIs bilaterally 8. Chronic kidney disease stage 3-4 9. 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   Robert Stanton seems to be doing well and stable from a cardiac arrest or standpoint. He is only taking Bumex as needed for swelling. He's had some dizziness with positional changes which does not seem to be orthostatic as his test was  negative today. I think this could be related to inner ear problems. A significant hearing loss and tinnitus. He is not compliant with his hearing aid. I like for him to see an ENT for further evaluation and optimization of his symptoms. Follow-up with me in 6 months.  Pixie Casino, MD, [facility] Attending Cardiologist [place] C Shayne Diguglielmo 11/22/2016, 3:54 PM

## 2016-11-24 ENCOUNTER — Encounter: Payer: Self-pay | Admitting: Internal Medicine

## 2016-11-24 ENCOUNTER — Ambulatory Visit (INDEPENDENT_AMBULATORY_CARE_PROVIDER_SITE_OTHER): Payer: PPO | Admitting: Internal Medicine

## 2016-11-24 ENCOUNTER — Other Ambulatory Visit: Payer: Self-pay | Admitting: Internal Medicine

## 2016-11-24 VITALS — BP 130/58 | HR 68 | Temp 100.0°F | Resp 16 | Ht 68.0 in | Wt 234.0 lb

## 2016-11-24 DIAGNOSIS — D631 Anemia in chronic kidney disease: Secondary | ICD-10-CM | POA: Diagnosis not present

## 2016-11-24 DIAGNOSIS — N179 Acute kidney failure, unspecified: Secondary | ICD-10-CM | POA: Diagnosis not present

## 2016-11-24 DIAGNOSIS — J441 Chronic obstructive pulmonary disease with (acute) exacerbation: Secondary | ICD-10-CM

## 2016-11-24 DIAGNOSIS — Z720 Tobacco use: Secondary | ICD-10-CM | POA: Diagnosis not present

## 2016-11-24 DIAGNOSIS — N184 Chronic kidney disease, stage 4 (severe): Secondary | ICD-10-CM | POA: Diagnosis not present

## 2016-11-24 DIAGNOSIS — I129 Hypertensive chronic kidney disease with stage 1 through stage 4 chronic kidney disease, or unspecified chronic kidney disease: Secondary | ICD-10-CM | POA: Diagnosis not present

## 2016-11-24 DIAGNOSIS — Z6834 Body mass index (BMI) 34.0-34.9, adult: Secondary | ICD-10-CM | POA: Diagnosis not present

## 2016-11-24 DIAGNOSIS — N2581 Secondary hyperparathyroidism of renal origin: Secondary | ICD-10-CM | POA: Diagnosis not present

## 2016-11-24 MED ORDER — ALBUTEROL SULFATE (2.5 MG/3ML) 0.083% IN NEBU: 2.5000 mg | INHALATION_SOLUTION | RESPIRATORY_TRACT | 0 refills | Status: AC | PRN

## 2016-11-24 MED ORDER — PREDNISONE 20 MG PO TABS: ORAL_TABLET | ORAL | 0 refills | Status: DC

## 2016-11-24 MED ORDER — PROMETHAZINE-DM 6.25-15 MG/5ML PO SYRP: ORAL_SOLUTION | ORAL | 1 refills | Status: DC

## 2016-11-24 MED ORDER — DOXYCYCLINE HYCLATE 100 MG PO CAPS: 100.0000 mg | ORAL_CAPSULE | Freq: Two times a day (BID) | ORAL | 0 refills | Status: DC

## 2016-11-24 MED ORDER — WARFARIN SODIUM 1 MG PO TABS: 1.0000 mg | ORAL_TABLET | Freq: Every day | ORAL | 0 refills | Status: DC

## 2016-11-24 NOTE — Patient Instructions (Signed)
Please take a 1/2 tablet of the 5 mg coumadin 4 days a week.    Take 1 mg full tablet 3 days a week.  Please take the prednisone until it is gone.  Please take the doxycycline until it is gone.  Restart the flonase 2 sprays per nostril at bedtime.  Use cough syrup up to 3 times daily as needed.   Please use the albuterol every 6 hours to keep you from having worsening shortness of breath.  Please go to the ER if confusion, shortness of breath, or fevers that go above 102 F.

## 2016-11-24 NOTE — Progress Notes (Signed)
HPI  Patient presents to the office for evaluation of fever x 2 days.  He developed coughing on late Monday and it got a lot worse last night.  He and his son are trying to keep him out of the hospital.  Patient reports wet, cough with green sputum production.  He is having some shortness of breath and wheezing.  They also endorse change in voice, chills, fever, postnasal drip, shortness of breath, sputum production and nasal congestion, headaches.  .  They have tried none.  They report that nothing has worked.  They admits to other sick contacts.  His grandson has the flu at home.    Review of Systems  Constitutional: Positive for chills, fever and malaise/fatigue.  HENT: Positive for congestion, hearing loss (Chronic hearing loss), sinus pain and sore throat. Negative for ear pain.   Respiratory: Positive for cough, sputum production, shortness of breath and wheezing.   Cardiovascular: Negative for chest pain, palpitations and leg swelling.    PE:  Vitals:   11/24/16 1448  BP: (!) 130/58  Pulse: 68  Resp: 16  Temp: 100 F (37.8 C)    General:  Alert and non-toxic, WDWN, NAD HEENT: NCAT, PERLA, EOM normal, no occular discharge or erythema.  Nasal mucosal edema without sinus tenderness to palpation.  Oropharynx clear with minimal oropharyngeal edema and erythema.  Mucous membranes moist and pink.  There is some bilateral ear effusions. Neck:  No Cervical adenopathy Chest:  RRR no RGs.  3/6 clicking murmur on the lower left sternal border.  Lungs with scattered wheezes at the bases without audible crackles or wheezes.  No chest tenderness to palpation.     Abdomen: +BS x 4 quadrants, soft, non-tender, no guarding, rigidity, or rebound. Skin: Clammy and warm to palpation.   Neuro: A&Ox4, CN II-XII grossly intact  Assessment and Plan:   1. COPD exacerbation ([place]) -no flu testing available here today.  Given recent exposure do feel it is possible that this could be flu.  Given moderate  to severe COPD and long history of tobacco abuse will go ahead and cover for possible bacterial source. Use nebulizer every 6 hours as needed for softness and breath, wheezing, or coughing.  New tubing for machine was given. - predniSONE (DELTASONE) 20 MG tablet; 3 tabs po daily x 3 days, then 2 tabs x 3 days, then 1.5 tabs x 3 days, then 1 tab x 3 days, then 0.5 tabs x 3 days  Dispense: 27 tablet; Refill: 0 - doxycycline (VIBRAMYCIN) 100 MG capsule; Take 1 capsule (100 mg total) by mouth 2 (two) times daily. One po bid x 7 days  Dispense: 14 capsule; Refill: 0 - promethazine-dextromethorphan (PROMETHAZINE-DM) 6.25-15 MG/5ML syrup; Take 5-10 ML PO q8hrs prn for cough  Dispense: 180 mL; Refill: 1 - albuterol (PROVENTIL) (2.5 MG/3ML) 0.083% nebulizer solution; Take 3 mLs (2.5 mg total) by nebulization every 4 (four) hours as needed for wheezing or shortness of breath.  Dispense: 30 vial; Refill: 0 - warfarin (COUMADIN) 1 MG tablet; Take 1 tablet (1 mg total) by mouth daily.  Dispense: 30 tablet; Refill: 0 -cut coumadin in half while using abx.  I did send in 1 mg tablet. -assume contagious and have recommended limited exposure to his chronically ill wife.  -patient to go to ER if worsening shortness of breath, confusion, or intractable fevers.  He and son express understanding.

## 2016-11-25 ENCOUNTER — Other Ambulatory Visit: Payer: Self-pay | Admitting: Nephrology

## 2016-11-25 ENCOUNTER — Other Ambulatory Visit: Payer: Self-pay | Admitting: Rheumatology

## 2016-11-25 DIAGNOSIS — R51 Headache: Principal | ICD-10-CM

## 2016-11-25 DIAGNOSIS — I129 Hypertensive chronic kidney disease with stage 1 through stage 4 chronic kidney disease, or unspecified chronic kidney disease: Secondary | ICD-10-CM

## 2016-11-25 DIAGNOSIS — N184 Chronic kidney disease, stage 4 (severe): Principal | ICD-10-CM

## 2016-11-25 DIAGNOSIS — R519 Headache, unspecified: Secondary | ICD-10-CM

## 2016-12-02 ENCOUNTER — Ambulatory Visit
Admission: RE | Admit: 2016-12-02 | Discharge: 2016-12-02 | Disposition: A | Discharge status: Home / Self Care | Payer: PPO | Source: Ambulatory Visit | Attending: Nephrology | Admitting: Nephrology

## 2016-12-02 DIAGNOSIS — I129 Hypertensive chronic kidney disease with stage 1 through stage 4 chronic kidney disease, or unspecified chronic kidney disease: Secondary | ICD-10-CM

## 2016-12-02 DIAGNOSIS — N189 Chronic kidney disease, unspecified: Secondary | ICD-10-CM | POA: Diagnosis not present

## 2016-12-02 DIAGNOSIS — N184 Chronic kidney disease, stage 4 (severe): Principal | ICD-10-CM

## 2016-12-08 DIAGNOSIS — N179 Acute kidney failure, unspecified: Secondary | ICD-10-CM | POA: Diagnosis not present

## 2016-12-13 ENCOUNTER — Encounter: Payer: Self-pay | Admitting: Internal Medicine

## 2016-12-13 ENCOUNTER — Ambulatory Visit (INDEPENDENT_AMBULATORY_CARE_PROVIDER_SITE_OTHER): Payer: PPO | Admitting: Internal Medicine

## 2016-12-13 VITALS — BP 136/64 | HR 55 | Temp 97.5°F | Resp 12 | Ht 67.0 in | Wt 223.0 lb

## 2016-12-13 DIAGNOSIS — M109 Gout, unspecified: Secondary | ICD-10-CM

## 2016-12-13 DIAGNOSIS — E1122 Type 2 diabetes mellitus with diabetic chronic kidney disease: Secondary | ICD-10-CM

## 2016-12-13 DIAGNOSIS — Z79899 Other long term (current) drug therapy: Secondary | ICD-10-CM

## 2016-12-13 DIAGNOSIS — I482 Chronic atrial fibrillation: Secondary | ICD-10-CM | POA: Diagnosis not present

## 2016-12-13 DIAGNOSIS — Z7901 Long term (current) use of anticoagulants: Secondary | ICD-10-CM

## 2016-12-13 DIAGNOSIS — I10 Essential (primary) hypertension: Secondary | ICD-10-CM

## 2016-12-13 DIAGNOSIS — N184 Chronic kidney disease, stage 4 (severe): Secondary | ICD-10-CM

## 2016-12-13 DIAGNOSIS — I4821 Permanent atrial fibrillation: Secondary | ICD-10-CM

## 2016-12-13 DIAGNOSIS — I129 Hypertensive chronic kidney disease with stage 1 through stage 4 chronic kidney disease, or unspecified chronic kidney disease: Secondary | ICD-10-CM | POA: Diagnosis not present

## 2016-12-13 DIAGNOSIS — E782 Mixed hyperlipidemia: Secondary | ICD-10-CM | POA: Diagnosis not present

## 2016-12-13 DIAGNOSIS — E559 Vitamin D deficiency, unspecified: Secondary | ICD-10-CM | POA: Diagnosis not present

## 2016-12-13 DIAGNOSIS — J449 Chronic obstructive pulmonary disease, unspecified: Secondary | ICD-10-CM | POA: Diagnosis not present

## 2016-12-13 NOTE — Patient Instructions (Signed)
Bleeding Precautions When on Anticoagulant Therapy  WHAT IS ANTICOAGULANT THERAPY? Anticoagulant therapy is taking medicine to prevent or reduce blood clots. It is also called blood thinner therapy. Blood clots that form in your blood vessels can be dangerous. They can break loose and travel to your heart, lungs, or brain. This increases your risk of a heart attack or stroke. Anticoagulant therapy causes blood to clot more slowly. You may need anticoagulant therapy if you have:  A medical condition that increases the likelihood that blood clots will form.  A heart defect or a problem with heart rhythm. It is also a common treatment after heart surgery, such as valve replacement. WHAT ARE COMMON TYPES OF ANTICOAGULANT THERAPY? Anticoagulant medicine can be injected or taken by mouth.If you need anticoagulant therapy quickly at the hospital, the medicine may be injected under your skin or given through an IV tube. Heparin is a common example of an anticoagulant that you may get at the hospital. Most anticoagulant therapy is in the form of pills that you take at home every day. These may include:  Aspirin. This common blood thinner works by preventing blood cells (platelets) from sticking together to form a clot. Aspirin is not as strong as anticoagulants that slow down the time that it takes for your body to form a clot.  Clopidogrel. This is a newer type of drug that affects platelets. It is stronger than aspirin.  Warfarin. This is the most common anticoagulant. It changes the way your body uses vitamin K, a vitamin that helps your blood to clot. The risk of bleeding is higher with warfarin than with aspirin. You will need frequent blood tests to make sure you are taking the safest amount.  New anticoagulants. Several new drugs have been approved. They are all taken by mouth. Studies show that these drugs work as well as warfarin. They do not require blood testing. They may cause less bleeding  risk than warfarin. WHAT DO I NEED TO REMEMBER WHEN TAKING ANTICOAGULANT THERAPY? Anticoagulant therapy decreases your risk of forming a blood clot, but it increases your risk of bleeding. Work closely with your health care provider to make sure you are taking your medicine safely. These tips can help:  Learn ways to reduce your risk of bleeding.  If you are taking warfarin: ? Have blood tests as ordered by your health care provider. ? Do not make any sudden changes to your diet. Vitamin K in your diet can make warfarin less effective. ? Do not get pregnant. This medicine may cause birth defects.  Take your medicine at the same time every day. If you forget to take your medicine, take it as soon as you remember. If you miss a whole day, do not double your dose of medicine. Take your normal dose and call your health care provider to check in.  Do not stop taking your medicine on your own.  Tell your health care provider before you start taking any new medicine, vitamin, or herbal product. Some of these could interfere with your therapy.  Tell all of your health care providers that you are on anticoagulant therapy.  Do not have surgery, medical procedures, or dental work until you tell your health care provider that you are on anticoagulant therapy. WHAT CAN AFFECT HOW ANTICOAGULANTS WORK? Certain foods, vitamins, medicines, supplements, and herbal medicines change the way that anticoagulant therapy works. They may increase or decrease the effects of your anticoagulant therapy. Either result can be dangerous for you.    Many over-the-counter medicines for pain, colds, or stomach problems interfere with anticoagulant therapy. Take these only as told by your health care provider.  Do not drink alcohol. It can interfere with your medicine and increase your risk of an injury that causes bleeding.  If you are taking warfarin, do not begin eating more foods that contain vitamin K. These include  leafy green vegetables. Ask your health care provider if you should avoid any foods. WHAT ARE SOME WAYS TO PREVENT BLEEDING? You can prevent bleeding by taking certain precautions:  Be extra careful when you use knives, scissors, or other sharp objects.  Use an electric razor instead of a blade.  Do not use toothpicks.  Use a soft toothbrush.  Wear shoes that have nonskid soles.  Use bath mats and handrails in your bathroom.  Wear gloves while you do yard work.  Wear a helmet when you ride a bike.  Wear your seat belt.  Prevent falls by removing loose rugs and extension cords from areas where you walk.  Do not play contact sports or participate in other activities that have a high risk of injury. WHEN SHOULD I CONTACT MY HEALTH CARE PROVIDER? Call your health care provider if:  You miss a dose of medicine: ? And you are not sure what to do. ? For more than one day.  You have: ? Menstrual bleeding that is heavier than normal. ? Blood in your urine. ? A bloody nose or bleeding gums. ? Easy bruising. ? Blood in your stool (feces) or have black and tarry stool. ? Side effects from your medicine.  You feel weak or dizzy.  You become pregnant. Seek immediate medical care if:  You have bleeding that will not stop.  You have sudden and severe headache or belly pain.  You vomit or you cough up bright red blood.  You have a severe blow to your head. WHAT ARE SOME QUESTIONS TO ASK MY HEALTH CARE PROVIDER?  What is the best anticoagulant therapy for my condition?  What side effects should I watch for?  When should I take my medicine? What should I do if I forget to take it?  Will I need to have regular blood tests?  Do I need to change my diet? Are there foods or drinks that I should avoid?  What activities are safe for me?  What should I do if I want to get pregnant? This information is not intended to replace advice given to you by your health care provider.  Make sure you discuss any questions you have with your health care provider. Document Released: 09/15/2015 Document Reviewed: 09/15/2015 Elsevier Interactive Patient Education  2017 [employer]  ++++++++++++++++++++++++++++++++++ Recommend Adult Low Dose Aspirin or  coated  Aspirin 81 mg daily  To reduce risk of Colon Cancer 20 %,  Skin Cancer 26 % ,  Melanoma 46%  and  Pancreatic cancer 60% +++++++++++++++++++++++++ Vitamin D goal  is between 70-100.  Please make sure that you are taking your Vitamin D as directed.  It is very important as a natural anti-inflammatory  helping hair, skin, and nails, as well as reducing stroke and heart attack risk.  It helps your bones and helps with mood. It also decreases numerous cancer risks so please take it as directed.  Low Vit D is associated with a 200-300% higher risk for CANCER  and 200-300% higher risk for HEART   ATTACK  &  STROKE.   ...................................... It is also associated with   higher death rate at younger ages,  autoimmune diseases like Rheumatoid arthritis, Lupus, Multiple Sclerosis.    Also many other serious conditions, like depression, Alzheimer's Dementia, infertility, muscle aches, fatigue, fibromyalgia - just to name a few. ++++++++++++++++++++ Recommend the book "The END of DIETING" by Dr Joel Fuhrman  & the book "The END of DIABETES " by Dr Joel Fuhrman At [employer] - get book & Audio CD's    Being diabetic has a  300% increased risk for heart attack, stroke, cancer, and alzheimer- type vascular dementia. It is very important that you work harder with diet by avoiding all foods that are white. Avoid white rice (brown & wild rice is OK), white potatoes (sweetpotatoes in moderation is OK), White bread or wheat bread or anything made out of white flour like bagels, donuts, rolls, buns, biscuits, cakes, pastries, cookies, pizza crust, and pasta (made from white flour & egg whites) - vegetarian pasta or  spinach or wheat pasta is OK. Multigrain breads like Arnold's or Pepper[employer], or multigrain sandwich thins or flatbreads.  Diet, exercise and weight loss can reverse and cure diabetes in the early stages.  Diet, exercise and weight loss is very important in the control and prevention of complications of diabetes which affects every system in your body, ie. Brain - dementia/stroke, eyes - glaucoma/blindness, heart - heart attack/heart failure, kidneys - dialysis, stomach - gastric paralysis, intestines - malabsorption, nerves - severe painful neuritis, circulation - gangrene & loss of a leg(s), and finally cancer and Alzheimers.    I recommend avoid fried & greasy foods,  sweets/candy, white rice (brown or wild rice or Quinoa is OK), white potatoes (sweet potatoes are OK) - anything made from white flour - bagels, doughnuts, rolls, buns, biscuits,white and wheat breads, pizza crust and traditional pasta made of white flour & egg white(vegetarian pasta or spinach or wheat pasta is OK).  Multi-grain bread is OK - like multi-grain flat bread or sandwich thins. Avoid alcohol in excess. Exercise is also important.    Eat all the vegetables you want - avoid meat, especially red meat and dairy - especially cheese.  Cheese is the most concentrated form of trans-fats which is the worst thing to clog up our arteries. Veggie cheese is OK which can be found in the fresh produce section at [employer] or [employer] or [employer]  +++++++++++++++++++++ DASH Eating Plan  DASH stands for "Dietary Approaches to Stop Hypertension."   The DASH eating plan is a healthy eating plan that has been shown to reduce high blood pressure (hypertension). Additional health benefits may include reducing the risk of type 2 diabetes mellitus, heart disease, and stroke. The DASH eating plan may also help with weight loss. WHAT DO I NEED TO KNOW ABOUT THE DASH EATING PLAN? For the DASH eating plan, you will follow these general  guidelines:  Choose foods with a percent daily value for sodium of less than 5% (as listed on the food label).  Use salt-free seasonings or herbs instead of table salt or sea salt.  Check with your health care provider or pharmacist before using salt substitutes.  Eat lower-sodium products, often labeled as "lower sodium" or "no salt added."  Eat fresh foods.  Eat more vegetables, fruits, and low-fat dairy products.  Choose whole grains. Look for the word "whole" as the first word in the ingredient list.  Choose fish   Limit sweets, desserts, sugars, and sugary drinks.  Choose heart-healthy fats.  Eat veggie cheese     Eat more home-cooked food and less restaurant, buffet, and fast food.  Limit fried foods.  Cook foods using methods other than frying.  Limit canned vegetables. If you do use them, rinse them well to decrease the sodium.  When eating at a restaurant, ask that your food be prepared with less salt, or no salt if possible.                      WHAT FOODS CAN I EAT? Read Dr Joel Fuhrman's books on The End of Dieting & The End of Diabetes  Grains Whole grain or whole wheat bread. Brown rice. Whole grain or whole wheat pasta. Quinoa, bulgur, and whole grain cereals. Low-sodium cereals. Corn or whole wheat flour tortillas. Whole grain cornbread. Whole grain crackers. Low-sodium crackers.  Vegetables Fresh or frozen vegetables (raw, steamed, roasted, or grilled). Low-sodium or reduced-sodium tomato and vegetable juices. Low-sodium or reduced-sodium tomato sauce and paste. Low-sodium or reduced-sodium canned vegetables.   Fruits All fresh, canned (in natural juice), or frozen fruits.  Protein Products  All fish and seafood.  Dried beans, peas, or lentils. Unsalted nuts and seeds. Unsalted canned beans.  Dairy Low-fat dairy products, such as skim or 1% milk, 2% or reduced-fat cheeses, low-fat ricotta or cottage cheese, or plain low-fat yogurt. Low-sodium or  reduced-sodium cheeses.  Fats and Oils Tub margarines without trans fats. Light or reduced-fat mayonnaise and salad dressings (reduced sodium). Avocado. Safflower, olive, or canola oils. Natural peanut or almond butter.  Other Unsalted popcorn and pretzels. The items listed above may not be a complete list of recommended foods or beverages. Contact your dietitian for more options.  +++++++++++++++  WHAT FOODS ARE NOT RECOMMENDED? Grains/ White flour or wheat flour White bread. White pasta. White rice. Refined cornbread. Bagels and croissants. Crackers that contain trans fat.  Vegetables  Creamed or fried vegetables. Vegetables in a . Regular canned vegetables. Regular canned tomato sauce and paste. Regular tomato and vegetable juices.  Fruits Dried fruits. Canned fruit in light or heavy syrup. Fruit juice.  Meat and Other Protein Products Meat in general - RED meat & White meat.  Fatty cuts of meat. Ribs, chicken wings, all processed meats as bacon, sausage, bologna, salami, fatback, hot dogs, bratwurst and packaged luncheon meats.  Dairy Whole or 2% milk, cream, half-and-half, and cream cheese. Whole-fat or sweetened yogurt. Full-fat cheeses or blue cheese. Non-dairy creamers and whipped toppings. Processed cheese, cheese spreads, or cheese curds.  Condiments Onion and garlic salt, seasoned salt, table salt, and sea salt. Canned and packaged gravies. Worcestershire sauce. Tartar sauce. Barbecue sauce. Teriyaki sauce. Soy sauce, including reduced sodium. Steak sauce. Fish sauce. Oyster sauce. Cocktail sauce. Horseradish. Ketchup and mustard. Meat flavorings and tenderizers. Bouillon cubes. Hot sauce. Tabasco sauce. Marinades. Taco seasonings. Relishes.  Fats and Oils Butter, stick margarine, lard, shortening and bacon fat. Coconut, palm kernel, or palm oils. Regular salad dressings.  Pickles and olives. Salted popcorn and pretzels.  The items listed above may not be a complete  list of foods and beverages to avoid.   

## 2016-12-13 NOTE — Progress Notes (Signed)
[facility], M.D.        [name]. [name], P.A.-C       [name], P.A.-C  [facility]                [address] [place] [id number] Telephone [phone] Telefax [phone] ______________________________________________________________________     This very nice 76 y.o. 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 Patient reports compliance w/CPAP and improved restorative sleep. Patient has severe COPD and is reticent to stopping smoking. Also, patient has hx/o Gout controlled w/Allopurinol.      Patient is treated for HTN since 1980 and underwent CABG & [place] in 2000.  Patient failed RFA for Afib in 2000. Patient is followed by Dr [name] for his ASHD.  BP has been controlled at home. Today's BP is at goal - 136/64. 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 Also he has hx/o RAa stenosis a followed by Dy Gwenlyn Found. 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  Patient has had no complaints of any cardiac type chest pain, palpitations, dyspnea/orthopnea/PND, dizziness, claudication, or dependent edema.     Hyperlipidemia is controlled with diet & meds. Patient denies myalgias or other med SE's. Last Lipids were near goal: Lab Results  Component Value Date   CHOL 163 09/08/2016   HDL 34 (L) 09/08/2016   LDLCALC 104 (H) 09/08/2016   TRIG 126 09/08/2016   CHOLHDL 4.8 09/08/2016      Also, the patient has history of T2_NIDDM currently managed with diet and als has CKD2 (GFR 20 ml/min). He denies symptoms of reactive hypoglycemia, diabetic polys, paresthesias or visual blurring.  Last A1c was at goal: Lab Results  Component Value Date   HGBA1C 5.3 09/08/2016       Patient has been on Thyroid replacement since 2015.   Further, the patient also has history of Vitamin D Deficiency and supplements vitamin D without any suspected side-effects. Last vitamin D was at goal: Lab Results  Component Value Date   VD25OH 76 09/08/2016   Current Outpatient Prescriptions on File Prior to Visit  Medication Sig  . albuterol  0.083% neb soln Take 3 mL/2.5 mg by neb every 4  hours as needed   . allopurinol  300 MG tablet TAKE 1 TABDAILY  . amiodarone  200 MG tablet TAKE 1 TAB DAILY.  [name] aspirin EC 81 MG tablet Take  daily.  . bumetanide  2 MG tablet Take 1/2 tab daily as needed  . VITAMIN D 5000 units  Take 10,000 Units  daily.  [name] LOTRISONE cream 1 application topically as needed  . fenofibrate  145 MG tablet TAKE 1 TAB DAILY FOR BLOOD FATS  . IMDUR 30 MG 24 hr  TAKE 1 TAB  EVERY DAY  . levothyroxine (SYNTHROID, LEVOTHROID) 50 MCG tablet TAKE 1 TAB  EVERY DAY  . montelukast  10 MG tablet TAKE 1 TAB DAILY.  [name] NITROSTAT 0.4 MG SL tablet AS NEEDED FOR CHEST PAIN.  [name] pravastatin  40 MG tablet TAKE 1 TAB EVERY MORNING.  [name] sertraline  50 MG tablet TAKE 1 TAB DAILY.  . verapamil 80 MG tablet TAKE 1 TAB 2 TIMES DAILY.  [name]  vitamin B-12 1000 MCG tab Take  daily.  [name] warfarin  5 MG tablet Take 1/2 tab / 2.5 mg x 3 days and 1 tab/ 5 mg x 4 days   Allergies  Allergen Reactions  . Chantix [Varenicline] Nausea And Vomiting  . Lyrica [Pregabalin]     Swelling, couldn't breathe  . Nsaids Other (See Comments)    Cannot take while on coumadin  . Simvastatin     Pt said he had a "bleed out" after taking it  . Ziac [Bisoprolol-Hydrochlorothiazide]     fatigue   PMHx:   Past Medical History:  Diagnosis Date  . AAA (abdominal aortic aneurysm) ([place])   . CAD (coronary artery disease)    a. CABG '00. b. all SVGs occluded 2010. c. low risk Nuc 2012   . Cataracts, bilateral   . Chronic anticoagulation   . CKD (chronic kidney disease), stage III   . COPD (chronic obstructive pulmonary disease) ([place])   . GERD (gastroesophageal reflux  disease)   . Gout   . Hiatal hernia   . History of Doppler ultrasound 04/05/2013   LEAs; small distal abd. aoritc aneuysm is stable; fem-pop graft to R leg has 50-69% blockage  . History of echocardiogram 04/17/2013   EF 40-45%; LV hypertrophy; LV mild-mod dilated; AV regurg.; LA severely diated, RA mildly dilated  . History of nuclear stress test 05/14/2011   Dipyridamole; moderate perfusion defect in Basal Infrioer & Mid Inferior region - consistent w/infarct or scar; global LV systolic function mildly reduced; EKG negative for ischemia; low risk scan  . Hyperlipidemia   . Hypertension   . Ischemic cardiomyopathy   . Kidney cysts   . Mechanical heart valve present    a. Hx mechanical St Jude AVR 2000.  [name] OSA (obstructive sleep apnea)   . Peripheral arterial disease ([place])    a. s/p aorto bifem BG;  b. 11/2013 PVA distal R Fem-fem stenosis;  c. 12/2013 PTA of dist R femoral bypass graft.  . Permanent atrial fibrillation ([place])   . Prediabetes   . Ventricular tachycardia (paroxysmal) ([place])    Seen by Dr. Lovena Le with EP, started on amiodarone  . Vitamin B deficiency    Immunization History  Administered Date(s) Administered  . DT 01/25/2012  . Influenza Split 06/28/2013  . Influenza, High Dose Seasonal PF 08/02/2014, 07/17/2015, 07/05/2016  . Pneumococcal Conjugate-13 03/25/2014  . Pneumococcal Polysaccharide-23 08/11/2012  . Zoster 10/30/2010   Past Surgical History:  Procedure Laterality Date  . AORTIC VALVE REPLACEMENT     St. Jude  . CARDIAC CATHETERIZATION  11/25/2008   loss of 2/3 (RCA, OM) bypass grafts with patent internal mammary artery to LAD; large collateral filling of RCA ; osteal narrowing of circumflex of ~50%  . CARDIOVERSION  8/282012  . CORONARY ARTERY BYPASS GRAFT  2000   3 vessel; LIMA to LAD; RCA, OM  . FEMORAL BYPASS    . LOWER EXTREMITY ANGIOGRAM N/A 12/03/2013   Procedure: LOWER EXTREMITY ANGIOGRAM;  Surgeon: Lorretta Harp, MD;  Location: [facility] CATH LAB;   Service: Cardiovascular;  Laterality: N/A;  . LOWER EXTREMITY ANGIOGRAM N/A 01/07/2014   Procedure: LOWER EXTREMITY ANGIOGRAM;  Surgeon: Lorretta Harp, MD;  Location: [facility] CATH LAB;  Service: Cardiovascular;  Laterality: N/A;  . shunts in femoral arteries    . stents in kidneys     FHx:    Reviewed / unchanged  SHx:    Reviewed / unchanged  Systems Review:  Constitutional: Denies  fever, chills, wt changes, headaches, insomnia, fatigue, night sweats, change in appetite. Eyes: Denies redness, blurred vision, diplopia, discharge, itchy, watery eyes.  ENT: Denies discharge, congestion, post nasal drip, epistaxis, sore throat, earache, hearing loss, dental pain, tinnitus, vertigo, sinus pain, snoring.  CV: Denies chest pain, palpitations, irregular heartbeat, syncope, dyspnea, diaphoresis, orthopnea, PND, claudication or edema. Respiratory: denies cough, dyspnea, DOE, pleurisy, hoarseness, laryngitis, wheezing.  Gastrointestinal: Denies dysphagia, odynophagia, heartburn, reflux, water brash, abdominal pain or cramps, nausea, vomiting, bloating, diarrhea, constipation, hematemesis, melena, hematochezia  or hemorrhoids. Genitourinary: Denies dysuria, frequency, urgency, nocturia, hesitancy, discharge, hematuria or flank pain. Musculoskeletal: Denies arthralgias, myalgias, stiffness, jt. swelling, pain, limping or strain/sprain.  Skin: Denies pruritus, rash, hives, warts, acne, eczema or change in skin lesion(s). Neuro: No weakness, tremor, incoordination, spasms, paresthesia or pain. Psychiatric: Denies confusion, memory loss or sensory loss. Endo: Denies change in weight, skin or hair change.  Heme/Lymph: No excessive bleeding, bruising or enlarged lymph nodes.  Physical Exam  BP 136/64   Pulse (!) 55   Temp 97.5 F (36.4 C)   Resp 12   Ht 5\' 7"  (1.702 m)   Wt 223 lb (101.2 kg)   BMI 34.93 kg/m   Appears over nourished and in no distress.  Eyes: PERRLA, EOMs, conjunctiva no swelling  or erythema. Sinuses: No frontal/maxillary tenderness ENT/Mouth: EAC's clear, TM's nl w/o erythema, bulging. Nares clear w/o erythema, swelling, exudates. Oropharynx clear without erythema or exudates. Oral hygiene is good. Tongue normal, non obstructing. Hearing intact.  Neck: Supple. Thyroid nl. Car 2+/2+ without bruits, nodes or JVD. Chest: Respirations nl with BS clear & equal w/o rales, rhonchi, wheezing or stridor.  Cor: Heart sounds normal w/ irregular rate and rhythm with prosthetic valve sounds and no murmurs or gallops appreciated. Pedal pulses not well palpitated. With 1+ pretibial & ankle edema.  Abdomen: Soft, rotund & bowel sounds normal. Non-tender w/o guarding, rebound, hernias, masses, or organomegaly.  Lymphatics: Unremarkable.  Musculoskeletal: Full ROM all peripheral extremities, joint stability, 5/5 strength, and normal gait.  Skin: Warm, dry without exposed rashes, lesions or ecchymosis apparent.  Neuro: Cranial nerves intact, reflexes equal bilaterally. Sensory-motor testing grossly intact. Tendon reflexes grossly intact.  Pysch: Alert & oriented x 3.  Insight and judgement nl & appropriate. No ideations.  Assessment and Plan:  1. Essential hypertension  - Continue medication, monitor blood pressure at home.  - Continue DASH diet. Reminder to go to the ER if any CP,  SOB, nausea, dizziness, severe HA, changes vision/speech,  left arm numbness and tingling and jaw pain. - CBC with Differential/Platelet - BASIC METABOLIC PANEL WITH GFR - TSH  2. Mixed hyperlipidemia  - Continue diet/meds, exercise,& lifestyle modifications.  - Continue monitor periodic cholesterol/liver & renal functions  - Hepatic function panel - Lipid panel - TSH  3. 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  - Monitor appropriate labs. - Hemoglobin A1c - Insulin, random  4. Vitamin D deficiency  - Continue  supplementation. - VITAMIN D 25 Hydroxy   5. Permanent atrial fibrillation (HCC)  - Protime-INR  6. Chronic obstructive pulmonary disease(HCC)   7. Gout  - Uric acid  8. Long term current use of anticoagulant therapy  - Protime-INR  9. Medication management - CBC with Differential/Platelet - BASIC METABOLIC PANEL WITH GFR - Hepatic function panel - Magnesium - Lipid panel - TSH - Hemoglobin A1c - Insulin, random - VITAMIN D 25 Hydroxy  -  Uric acid       Recommended regular exercise, BP monitoring, weight control, and discussed med and SE's. Recommended labs to assess and monitor clinical status. Further disposition pending results of labs. Over 30 minutes of exam, counseling, chart review was performed

## 2016-12-14 LAB — BASIC METABOLIC PANEL WITH GFR
BUN: 41 mg/dL — AB (ref 7–25)
CHLORIDE: 102 mmol/L (ref 98–110)
CO2: 34 mmol/L — ABNORMAL HIGH (ref 20–31)
Calcium: 9.4 mg/dL (ref 8.6–10.3)
Creat: 2.36 mg/dL — ABNORMAL HIGH (ref 0.70–1.18)
GFR, Est African American: 30 mL/min — ABNORMAL LOW (ref >=60)
GFR, Est Non African American: 26 mL/min — ABNORMAL LOW (ref >=60)
GLUCOSE: 61 mg/dL — AB (ref 65–99)
POTASSIUM: 4.5 mmol/L (ref 3.5–5.3)
Sodium: 139 mmol/L (ref 135–146)

## 2016-12-14 LAB — CBC WITH DIFFERENTIAL/PLATELET
BASOS PCT: 0 %
Basophils Absolute: 0 cells/uL (ref 0–200)
EOS ABS: 53 {cells}/uL (ref 15–500)
Eosinophils Relative: 1 %
HEMATOCRIT: 37.6 % — AB (ref 38.5–50.0)
Hemoglobin: 11.7 g/dL — ABNORMAL LOW (ref 13.2–17.1)
Lymphocytes Relative: 26 %
Lymphs Abs: 1378 cells/uL (ref 850–3900)
MCH: 29.3 pg (ref 27.0–33.0)
MCHC: 31.1 g/dL — ABNORMAL LOW (ref 32.0–36.0)
MCV: 94 fL (ref 80.0–100.0)
MONO ABS: 530 {cells}/uL (ref 200–950)
MPV: 11.3 fL (ref 7.5–12.5)
Monocytes Relative: 10 %
NEUTROS ABS: 3339 {cells}/uL (ref 1500–7800)
Neutrophils Relative %: 63 %
Platelets: 169 10*3/uL (ref 140–400)
RBC: 4 MIL/uL — ABNORMAL LOW (ref 4.20–5.80)
RDW: 16.1 % — ABNORMAL HIGH (ref 11.0–15.0)
WBC: 5.3 10*3/uL (ref 3.8–10.8)

## 2016-12-14 LAB — URIC ACID: URIC ACID, SERUM: 7.5 mg/dL (ref 4.0–8.0)

## 2016-12-14 LAB — HEMOGLOBIN A1C
Hgb A1c MFr Bld: 5.7 % — ABNORMAL HIGH (ref <5.7)
MEAN PLASMA GLUCOSE: 117 mg/dL

## 2016-12-14 LAB — HEPATIC FUNCTION PANEL
ALBUMIN: 3.8 g/dL (ref 3.6–5.1)
ALK PHOS: 59 U/L (ref 40–115)
ALT: 33 U/L (ref 9–46)
AST: 64 U/L — AB (ref 10–35)
Bilirubin, Direct: 0.4 mg/dL — ABNORMAL HIGH (ref <=0.2)
Indirect Bilirubin: 0.5 mg/dL (ref 0.2–1.2)
TOTAL PROTEIN: 7.3 g/dL (ref 6.1–8.1)
Total Bilirubin: 0.9 mg/dL (ref 0.2–1.2)

## 2016-12-14 LAB — TSH: TSH: 6.3 m[IU]/L — AB (ref 0.40–4.50)

## 2016-12-14 LAB — LIPID PANEL
Cholesterol: 172 mg/dL (ref <200)
HDL: 39 mg/dL — AB (ref >40)
LDL CALC: 109 mg/dL — AB (ref <100)
Total CHOL/HDL Ratio: 4.4 Ratio (ref <5.0)
Triglycerides: 119 mg/dL (ref <150)
VLDL: 24 mg/dL (ref <30)

## 2016-12-14 LAB — MAGNESIUM: Magnesium: 2.1 mg/dL (ref 1.5–2.5)

## 2016-12-14 LAB — PROTIME-INR
INR: 1.9 — ABNORMAL HIGH
Prothrombin Time: 19.6 s — ABNORMAL HIGH (ref 9.0–11.5)

## 2016-12-14 LAB — VITAMIN D 25 HYDROXY (VIT D DEFICIENCY, FRACTURES): Vit D, 25-Hydroxy: 79 ng/mL (ref 30–100)

## 2016-12-14 LAB — INSULIN, RANDOM: Insulin: 11.9 u[IU]/mL (ref 2.0–19.6)

## 2016-12-21 ENCOUNTER — Other Ambulatory Visit: Payer: Self-pay | Admitting: Internal Medicine

## 2016-12-21 DIAGNOSIS — J441 Chronic obstructive pulmonary disease with (acute) exacerbation: Secondary | ICD-10-CM

## 2016-12-21 NOTE — Telephone Encounter (Signed)
Refill sent to pharmacy on 6th March 2018

## 2016-12-31 DIAGNOSIS — N189 Chronic kidney disease, unspecified: Secondary | ICD-10-CM | POA: Diagnosis not present

## 2016-12-31 DIAGNOSIS — N184 Chronic kidney disease, stage 4 (severe): Secondary | ICD-10-CM | POA: Diagnosis not present

## 2016-12-31 DIAGNOSIS — N2581 Secondary hyperparathyroidism of renal origin: Secondary | ICD-10-CM | POA: Diagnosis not present

## 2017-01-06 DIAGNOSIS — N184 Chronic kidney disease, stage 4 (severe): Secondary | ICD-10-CM | POA: Diagnosis not present

## 2017-01-06 DIAGNOSIS — D631 Anemia in chronic kidney disease: Secondary | ICD-10-CM | POA: Diagnosis not present

## 2017-01-06 DIAGNOSIS — I129 Hypertensive chronic kidney disease with stage 1 through stage 4 chronic kidney disease, or unspecified chronic kidney disease: Secondary | ICD-10-CM | POA: Diagnosis not present

## 2017-01-06 DIAGNOSIS — N2581 Secondary hyperparathyroidism of renal origin: Secondary | ICD-10-CM | POA: Diagnosis not present

## 2017-01-06 DIAGNOSIS — Z6835 Body mass index (BMI) 35.0-35.9, adult: Secondary | ICD-10-CM | POA: Diagnosis not present

## 2017-01-10 ENCOUNTER — Encounter: Payer: Self-pay | Admitting: Internal Medicine

## 2017-01-10 ENCOUNTER — Ambulatory Visit (INDEPENDENT_AMBULATORY_CARE_PROVIDER_SITE_OTHER): Payer: PPO | Admitting: Internal Medicine

## 2017-01-10 VITALS — BP 126/58 | HR 64 | Temp 98.4°F | Resp 20 | Ht 67.0 in | Wt 223.0 lb

## 2017-01-10 DIAGNOSIS — Z7901 Long term (current) use of anticoagulants: Secondary | ICD-10-CM

## 2017-01-10 DIAGNOSIS — W19XXXA Unspecified fall, initial encounter: Secondary | ICD-10-CM

## 2017-01-10 LAB — PROTIME-INR
INR: 2 — AB
PROTHROMBIN TIME: 20.2 s — AB (ref 9.0–11.5)

## 2017-01-11 NOTE — Progress Notes (Signed)
Assessment and Plan:   1. Long-term (current) use of anticoagulants -cont coumadin -dose adjust -when it is time for graft placement will need to bridge with lovenox -will await medical clearance from vascular doctors -concern recently for fall risks but given valve replacement cannot come off of the coumadin - Protime-INR  2.  Fall -no injury -nodule patient feels is likely a fat necrosis  HPI 76 y.o.male presents for 1 month follow up of longterm anticoagulant use secondary to an artificial heart valve and chronic afib.  He is on coumadin currently.  He is doing well with this.  He is taking 5 mg x 6 days and 2.5 mg on 1 day.  He did recently fall on his left side.  He reports that he had some minimal bruising.  He did not hit his head.  He just has a lump where the bruise.  He did not have any shortness of breath or wheezing.  He did not hit his head with the fall.  He does not have headaches, blurry vision or N/V.    He has not recently been on antibiotics.  He is also seeing the vascular doctors soon to discuss the placement of a dialysis graft.  His kidney function continues to decline and his nephrologist feels that it is a matter of months before he will have to start doing dialysis.  He wants the graft to be mature for them to be able to do the dialysis with.  Past Medical History:  Diagnosis Date  . AAA (abdominal aortic aneurysm) ([place])   . CAD (coronary artery disease)    a. CABG '00. b. all SVGs occluded 2010. c. low risk Nuc 2012   . Cataracts, bilateral   . Chronic anticoagulation   . CKD (chronic kidney disease), stage III   . COPD (chronic obstructive pulmonary disease) ([place])   . GERD (gastroesophageal reflux disease)   . Gout   . Hiatal hernia   . History of Doppler ultrasound 04/05/2013   LEAs; small distal abd. aoritc aneuysm is stable; fem-pop graft to R leg has 50-69% blockage  . History of echocardiogram 04/17/2013   EF 40-45%; LV hypertrophy; LV mild-mod dilated;  AV regurg.; LA severely diated, RA mildly dilated  . History of nuclear stress test 05/14/2011   Dipyridamole; moderate perfusion defect in Basal Infrioer & Mid Inferior region - consistent w/infarct or scar; global LV systolic function mildly reduced; EKG negative for ischemia; low risk scan  . Hyperlipidemia   . Hypertension   . Ischemic cardiomyopathy   . Kidney cysts   . Mechanical heart valve present    a. Hx mechanical St Jude AVR 2000.  [name] OSA (obstructive sleep apnea)   . Peripheral arterial disease ([place])    a. s/p aorto bifem BG;  b. 11/2013 PVA distal R Fem-fem stenosis;  c. 12/2013 PTA of dist R femoral bypass graft.  . Permanent atrial fibrillation ([place])   . Prediabetes   . Ventricular tachycardia (paroxysmal) ([place])    Seen by Dr. Lovena Le with EP, started on amiodarone  . Vitamin B deficiency      Allergies  Allergen Reactions  . Chantix [Varenicline] Nausea And Vomiting  . Lyrica [Pregabalin]     Swelling, couldn't breathe  . Nsaids Other (See Comments)    Cannot take while on coumadin  . Simvastatin     Pt said he had a "bleed out" after taking it  . Ziac [Bisoprolol-Hydrochlorothiazide]     fatigue  Current Outpatient Prescriptions on File Prior to Visit  Medication Sig Dispense Refill  . albuterol (PROVENTIL) (2.5 MG/3ML) 0.083% nebulizer solution Take 3 mLs (2.5 mg total) by nebulization every 4 (four) hours as needed for wheezing or shortness of breath. 30 vial 0  . allopurinol (ZYLOPRIM) 300 MG tablet TAKE 1 TABLET BY MOUTH DAILY. TO PREVENT GOUT 90 tablet 1  . amiodarone (PACERONE) 200 MG tablet TAKE 1 TABLET (200 MG TOTAL) BY MOUTH DAILY. 30 tablet 6  . aspirin EC 81 MG tablet Take 81 mg by mouth daily.    . bumetanide (BUMEX) 2 MG tablet Take 1/2 tablet daily as needed for swelling 30 tablet 6  . Cholecalciferol (VITAMIN D3) 5000 units TABS Take 10,000 Units by mouth daily.    . clotrimazole-betamethasone (LOTRISONE) cream Apply 1 application topically  daily as needed (rash/ skin irritation).    . fenofibrate (TRICOR) 145 MG tablet TAKE 1 TABLET DAILY FOR BLOOD FATS 90 tablet 3  . isosorbide mononitrate (IMDUR) 30 MG 24 hr tablet TAKE 1 TABLET BY MOUTH EVERY DAY 90 tablet 1  . levothyroxine (SYNTHROID, LEVOTHROID) 50 MCG tablet TAKE 1 TABLET BY MOUTH EVERY DAY 90 tablet 1  . montelukast (SINGULAIR) 10 MG tablet TAKE 1 TABLET (10 MG TOTAL) BY MOUTH DAILY. 90 tablet 1  . NITROSTAT 0.4 MG SL tablet PLACE 1 TABLET (0.4 MG TOTAL) UNDER THE TONGUE EVERY 5 (FIVE) MINUTES AS NEEDED FOR CHEST PAIN. 25 tablet 5  . pravastatin (PRAVACHOL) 40 MG tablet TAKE 1 TABLET (40 MG TOTAL) BY MOUTH EVERY MORNING. 90 tablet 1  . PRESCRIPTION MEDICATION Inhale into the lungs See admin instructions. Use CPAP whenever sleeping    . sertraline (ZOLOFT) 50 MG tablet TAKE 1 TABLET (50 MG TOTAL) BY MOUTH DAILY. 90 tablet 1  . verapamil (CALAN) 80 MG tablet TAKE 1 TABLET (80 MG TOTAL) BY MOUTH 2 (TWO) TIMES DAILY. 180 tablet 1  . vitamin B-12 (CYANOCOBALAMIN) 1000 MCG tablet Take 1,000 mcg by mouth daily.    [name] warfarin (COUMADIN) 1 MG tablet TAKE 1 TABLET (1 MG TOTAL) BY MOUTH DAILY. 30 tablet 0  . warfarin (COUMADIN) 5 MG tablet Take 1 tablet daily or as directed by your doctor. 90 tablet 3   No current facility-administered medications on file prior to visit.     ROS: all negative except above.   Physical Exam: Filed Weights   01/10/17 1610  Weight: 223 lb (101.2 kg)   BP (!) 126/58   Pulse 64   Temp 98.4 F (36.9 C) (Temporal)   Resp 20   Ht 5\' 7"  (1.702 m)   Wt 223 lb (101.2 kg)   SpO2 99%   BMI 34.93 kg/m  General Appearance: Well developed well nourished, non-toxic appearing in no apparent distress. Eyes: PERRLA, EOMs, conjunctiva w/ no swelling or erythema or discharge Sinuses: No Frontal/maxillary tenderness ENT/Mouth: Ear canals clear without swelling or erythema.  TM's normal bilaterally with no retractions, bulging, or loss of landmarks.   Neck:  Supple, thyroid normal, no notable JVD  Respiratory: Respiratory effort normal, Clear breath sounds anteriorly and posteriorly bilaterally without rales, rhonchi, wheezing or stridor. No retractions or accessory muscle usage. Cardio: Ireg ireg with no 3/6 murmur with a click that radiates to the posterior chest wall.  No RGs.   Abdomen: Soft, + BS.  Non tender, no guarding, rebound, hernias, masses.  Musculoskeletal: Full ROM, 5/5 strength, normal gait.  Skin: Bruising to bilateral arms, Warm, dry without rashes  Neuro: Awake and oriented X 3, Cranial nerves intact. Normal muscle tone, no cerebellar symptoms. Sensation intact.  Psych: normal affect, Insight and Judgment appropriate.     [name], PA-C 12:43 PM [facility]

## 2017-01-19 ENCOUNTER — Other Ambulatory Visit: Payer: Self-pay | Admitting: Surgery

## 2017-01-19 DIAGNOSIS — N185 Chronic kidney disease, stage 5: Secondary | ICD-10-CM

## 2017-01-26 DIAGNOSIS — H903 Sensorineural hearing loss, bilateral: Secondary | ICD-10-CM | POA: Diagnosis not present

## 2017-01-26 DIAGNOSIS — R42 Dizziness and giddiness: Secondary | ICD-10-CM | POA: Diagnosis not present

## 2017-01-26 DIAGNOSIS — H9313 Tinnitus, bilateral: Secondary | ICD-10-CM | POA: Diagnosis not present

## 2017-02-09 ENCOUNTER — Encounter: Payer: Self-pay | Admitting: Surgery

## 2017-02-10 ENCOUNTER — Encounter: Payer: Self-pay | Admitting: Physician Assistant

## 2017-02-10 ENCOUNTER — Ambulatory Visit (INDEPENDENT_AMBULATORY_CARE_PROVIDER_SITE_OTHER): Payer: PPO | Admitting: Physician Assistant

## 2017-02-10 VITALS — BP 128/74 | HR 67 | Temp 98.1°F | Resp 16 | Ht 67.0 in | Wt 224.4 lb

## 2017-02-10 DIAGNOSIS — Z7901 Long term (current) use of anticoagulants: Secondary | ICD-10-CM

## 2017-02-10 DIAGNOSIS — I129 Hypertensive chronic kidney disease with stage 1 through stage 4 chronic kidney disease, or unspecified chronic kidney disease: Secondary | ICD-10-CM

## 2017-02-10 DIAGNOSIS — M25552 Pain in left hip: Secondary | ICD-10-CM | POA: Diagnosis not present

## 2017-02-10 DIAGNOSIS — J449 Chronic obstructive pulmonary disease, unspecified: Secondary | ICD-10-CM

## 2017-02-10 DIAGNOSIS — I701 Atherosclerosis of renal artery: Secondary | ICD-10-CM | POA: Diagnosis not present

## 2017-02-10 DIAGNOSIS — I714 Abdominal aortic aneurysm, without rupture, unspecified: Secondary | ICD-10-CM

## 2017-02-10 DIAGNOSIS — I739 Peripheral vascular disease, unspecified: Secondary | ICD-10-CM

## 2017-02-10 DIAGNOSIS — N184 Chronic kidney disease, stage 4 (severe): Secondary | ICD-10-CM | POA: Diagnosis not present

## 2017-02-10 DIAGNOSIS — I482 Chronic atrial fibrillation: Secondary | ICD-10-CM

## 2017-02-10 DIAGNOSIS — E1122 Type 2 diabetes mellitus with diabetic chronic kidney disease: Secondary | ICD-10-CM

## 2017-02-10 DIAGNOSIS — G4733 Obstructive sleep apnea (adult) (pediatric): Secondary | ICD-10-CM | POA: Diagnosis not present

## 2017-02-10 DIAGNOSIS — I4821 Permanent atrial fibrillation: Secondary | ICD-10-CM

## 2017-02-10 DIAGNOSIS — I10 Essential (primary) hypertension: Secondary | ICD-10-CM

## 2017-02-10 DIAGNOSIS — E039 Hypothyroidism, unspecified: Secondary | ICD-10-CM

## 2017-02-10 LAB — CBC WITH DIFFERENTIAL/PLATELET
BASOS PCT: 0 %
Basophils Absolute: 0 cells/uL (ref 0–200)
Eosinophils Absolute: 195 cells/uL (ref 15–500)
Eosinophils Relative: 3 %
HCT: 32.9 % — ABNORMAL LOW (ref 38.5–50.0)
Hemoglobin: 10.3 g/dL — ABNORMAL LOW (ref 13.2–17.1)
LYMPHS PCT: 17 %
Lymphs Abs: 1105 cells/uL (ref 850–3900)
MCH: 30.7 pg (ref 27.0–33.0)
MCHC: 31.3 g/dL — AB (ref 32.0–36.0)
MCV: 97.9 fL (ref 80.0–100.0)
MONOS PCT: 9 %
MPV: 10.3 fL (ref 7.5–12.5)
Monocytes Absolute: 585 cells/uL (ref 200–950)
NEUTROS PCT: 71 %
Neutro Abs: 4615 cells/uL (ref 1500–7800)
PLATELETS: 228 10*3/uL (ref 140–400)
RBC: 3.36 MIL/uL — ABNORMAL LOW (ref 4.20–5.80)
RDW: 16.6 % — AB (ref 11.0–15.0)
WBC: 6.5 10*3/uL (ref 3.8–10.8)

## 2017-02-10 LAB — HEPATIC FUNCTION PANEL
ALK PHOS: 76 U/L (ref 40–115)
ALT: 22 U/L (ref 9–46)
AST: 64 U/L — AB (ref 10–35)
Albumin: 3.3 g/dL — ABNORMAL LOW (ref 3.6–5.1)
BILIRUBIN TOTAL: 1 mg/dL (ref 0.2–1.2)
Bilirubin, Direct: 0.4 mg/dL — ABNORMAL HIGH (ref <=0.2)
Indirect Bilirubin: 0.6 mg/dL (ref 0.2–1.2)
Total Protein: 7.5 g/dL (ref 6.1–8.1)

## 2017-02-10 LAB — BASIC METABOLIC PANEL WITH GFR
BUN: 44 mg/dL — AB (ref 7–25)
CO2: 29 mmol/L (ref 20–31)
Calcium: 9.1 mg/dL (ref 8.6–10.3)
Chloride: 98 mmol/L (ref 98–110)
Creat: 2.89 mg/dL — ABNORMAL HIGH (ref 0.70–1.18)
GFR, EST AFRICAN AMERICAN: 24 mL/min — AB (ref >=60)
GFR, Est Non African American: 20 mL/min — ABNORMAL LOW (ref >=60)
Glucose, Bld: 103 mg/dL — ABNORMAL HIGH (ref 65–99)
POTASSIUM: 4.2 mmol/L (ref 3.5–5.3)
Sodium: 138 mmol/L (ref 135–146)

## 2017-02-10 NOTE — Patient Instructions (Signed)
Get Xray of hip If any AB pain at bruise, or if gets larger call the office or go to the ER  Your PT/INR is the test we use to check your coumadin level.  Your goal for your INR is between 2.5-3.5 If you number is below 2.5 than your blood is thick and you need more coumadin. You are at risk for stroke or clotting during this time period.  If you number is above 3.5 than your blood is too thin and you need less coumadin or can eat more greens at this time. You are at risk for stomach or head bleeds and need to be careful.

## 2017-02-10 NOTE — Progress Notes (Signed)
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 - BASIC METABOLIC PANEL WITH GFR   Permanent atrial fibrillation (H[facility]) Rate controlled, continue coumadin, GOAL 2.5-3.5 - Protime-INR - CBC with Differential/Platelet  Peripheral arterial disease ([facility]) Control blood pressure, cholesterol, glucose, increase exercise.  Advised to quit smoking  Abdominal aortic aneurysm (AAA) without rupture (H[facility]) Control blood pressure, cholesterol, glucose, increase exercise.  Advised to quit smoking  Chronic obstructive pulmonary disease, unspecified COPD type ([place]) Advised to stop smoking, continue meds.   Type 2 diabetes mellitus with stage 4 chronic kidney disease, without long-term current use of insulin ([place]) Discussed general issues about diabetes pathophysiology and management., Educational material distributed., Suggested low cholesterol diet., Encouraged aerobic exercise., Discussed foot care., Reminded to get yearly retinal exam.   Tobacco use disorder Patient is not interested at this time, but has cut back  ESRD Check kidney function  Left hip ecchymosis No evidence of hematoma, will monitor Get Xray, no history of fall, and full ROM left hip  Future Appointments Date Time Provider [place]  02/16/2017 11:00 AM [facility]  02/16/2017 11:30 AM [facility]  02/16/2017 12:15 PM Serafina Mitchell, MD [facility]  03/15/2017 11:30 AM Unk Pinto, MD [facility] None  07/06/2017 10:00 AM Unk Pinto, MD [facility] None    HPI 76 y.o.male presents for 1 month follow up of long term coumadin use for chronic atrial fibrillation. Patient reports that they have been doing well.  He is not having  severe,bleeding, epistaxis, melena, or hematochezia.  He does take his coumadin daily. Last visit he held his coumadin x 3 days and then decreased to 2.5 mg 1 days a week and 5 mg 6 days a week.  He denies any recent falls but does have large hematoma/bruise over left hip.  He has CHF, his weight is down, SOB is at baseline, denies any recent CP, no NTG. He is on bumex 1 pill daily, 2 mg and may be switched to lasix due to cost.  Lab Results  Component Value Date   INR 2.0 (H) 01/10/2017   INR 1.9 (H) 12/13/2016   INR 2.9 (H) 11/09/2016   He has ESRD and going to get graft/vein mapping next Wednesday for possible dialysis, follows with Dr. Posey Pronto.    Lab Results  Component Value Date   GFRNONAA 26 (L) 12/13/2016   Wt Readings from Last 3 Encounters:  02/10/17 224 lb 6.4 oz (101.8 kg)  01/10/17 223 lb (101.2 kg)  12/13/16 223 lb (101.2 kg)   Lab Results  Component Value Date   TSH 6.30 (H) 12/13/2016     Past Medical History:  Diagnosis Date  . AAA (abdominal aortic aneurysm) ([place])   . CAD (coronary artery disease)    a. CABG '00. b. all SVGs occluded 2010. c. low risk Nuc 2012   . Cataracts, bilateral   . Chronic anticoagulation   . CKD (chronic kidney disease), stage III   . COPD (chronic obstructive pulmonary disease) ([place])   . GERD (gastroesophageal reflux disease)   . Gout   . Hiatal hernia   . History of Doppler ultrasound 04/05/2013   LEAs; small distal abd. aoritc aneuysm is stable; fem-pop graft to R leg has 50-69% blockage  . History of echocardiogram  04/17/2013   EF 40-45%; LV hypertrophy; LV mild-mod dilated; AV regurg.; LA severely diated, RA mildly dilated  . History of nuclear stress test 05/14/2011   Dipyridamole; moderate perfusion defect in Basal Infrioer & Mid Inferior region - consistent w/infarct or scar; global LV systolic function mildly reduced; EKG negative for ischemia; low risk scan  . Hyperlipidemia   . Hypertension   . Ischemic cardiomyopathy   .  Kidney cysts   . Mechanical heart valve present    a. Hx mechanical St Jude AVR 2000.  [name] OSA (obstructive sleep apnea)   . Peripheral arterial disease ([place])    a. s/p aorto bifem BG;  b. 11/2013 PVA distal R Fem-fem stenosis;  c. 12/2013 PTA of dist R femoral bypass graft.  . Permanent atrial fibrillation ([place])   . Prediabetes   . Ventricular tachycardia (paroxysmal) ([place])    Seen by Dr. Lovena Le with EP, started on amiodarone  . Vitamin B deficiency      Allergies  Allergen Reactions  . Chantix [Varenicline] Nausea And Vomiting  . Lyrica [Pregabalin]     Swelling, couldn't breathe  . Nsaids Other (See Comments)    Cannot take while on coumadin  . Simvastatin     Pt said he had a "bleed out" after taking it  . Ziac [Bisoprolol-Hydrochlorothiazide]     fatigue      Current Outpatient Prescriptions on File Prior to Visit  Medication Sig Dispense Refill  . albuterol (PROVENTIL) (2.5 MG/3ML) 0.083% nebulizer solution Take 3 mLs (2.5 mg total) by nebulization every 4 (four) hours as needed for wheezing or shortness of breath. 30 vial 0  . allopurinol (ZYLOPRIM) 300 MG tablet TAKE 1 TABLET BY MOUTH DAILY. TO PREVENT GOUT 90 tablet 1  . amiodarone (PACERONE) 200 MG tablet TAKE 1 TABLET (200 MG TOTAL) BY MOUTH DAILY. 30 tablet 6  . aspirin EC 81 MG tablet Take 81 mg by mouth daily.    . bumetanide (BUMEX) 2 MG tablet Take 1/2 tablet daily as needed for swelling 30 tablet 6  . Cholecalciferol (VITAMIN D3) 5000 units TABS Take 10,000 Units by mouth daily.    . clotrimazole-betamethasone (LOTRISONE) cream Apply 1 application topically daily as needed (rash/ skin irritation).    . fenofibrate (TRICOR) 145 MG tablet TAKE 1 TABLET DAILY FOR BLOOD FATS 90 tablet 3  . isosorbide mononitrate (IMDUR) 30 MG 24 hr tablet TAKE 1 TABLET BY MOUTH EVERY DAY 90 tablet 1  . levothyroxine (SYNTHROID, LEVOTHROID) 50 MCG tablet TAKE 1 TABLET BY MOUTH EVERY DAY 90 tablet 1  . montelukast (SINGULAIR) 10 MG  tablet TAKE 1 TABLET (10 MG TOTAL) BY MOUTH DAILY. 90 tablet 1  . NITROSTAT 0.4 MG SL tablet PLACE 1 TABLET (0.4 MG TOTAL) UNDER THE TONGUE EVERY 5 (FIVE) MINUTES AS NEEDED FOR CHEST PAIN. 25 tablet 5  . pravastatin (PRAVACHOL) 40 MG tablet TAKE 1 TABLET (40 MG TOTAL) BY MOUTH EVERY MORNING. 90 tablet 1  . PRESCRIPTION MEDICATION Inhale into the lungs See admin instructions. Use CPAP whenever sleeping    . sertraline (ZOLOFT) 50 MG tablet TAKE 1 TABLET (50 MG TOTAL) BY MOUTH DAILY. 90 tablet 1  . verapamil (CALAN) 80 MG tablet TAKE 1 TABLET (80 MG TOTAL) BY MOUTH 2 (TWO) TIMES DAILY. 180 tablet 1  . vitamin B-12 (CYANOCOBALAMIN) 1000 MCG tablet Take 1,000 mcg by mouth daily.    [name] warfarin (COUMADIN) 1 MG tablet TAKE 1 TABLET (1 MG TOTAL) BY MOUTH DAILY. [place]  tablet 0  . warfarin (COUMADIN) 5 MG tablet Take 1 tablet daily or as directed by your doctor. 90 tablet 3   No current facility-administered medications on file prior to visit.     ROS: all negative except above.   Physical Exam: Filed Weights   02/10/17 1523  Weight: 224 lb 6.4 oz (101.8 kg)   BP 128/74   Pulse 67   Temp 98.1 F (36.7 C)   Resp 16   Ht 5\' 7"  (1.702 m)   Wt 224 lb 6.4 oz (101.8 kg)   SpO2 98%   BMI 35.15 kg/m  General Appearance: Well developed well nourished, non-toxic appearing in no apparent distress. Eyes: PERRLA, EOMs, conjunctiva w/ no swelling or erythema or discharge Sinuses: No Frontal/maxillary tenderness ENT/Mouth: Ear canals clear without swelling or erythema.  TM's normal bilaterally with no retractions, bulging, or loss of landmarks., decreased hearing Neck: Supple, thyroid normal, no notable JVD  Respiratory: Respiratory effort mildly increased, Clear but distant breath sounds anteriorly and posteriorly bilaterally without rales, rhonchi, or stridor.  There is the occasional end expiratory wheeze. No retractions or accessory muscle usage. Cardio: Ireg ireg with systolic mechanical click  RSB Abdomen: Soft, + BS.  Non tender, no guarding, rebound, hernias, masses.  Musculoskeletal: Full ROM, 5/5 strength, normal gait.  Skin: 15x20 cm spreading eccymosis over left hip, no palpable hematoma under, some tenderness, no warmth, nontender AB, no Warm, dry without rashes Neuro: Awake and oriented X 3, Cranial nerves intact. Normal muscle tone, no cerebellar symptoms. S Psych: normal affect, Insight and Judgment appropriate.     Vicie Mutters, PA-C 3:53 PM [facility]

## 2017-02-11 ENCOUNTER — Ambulatory Visit (HOSPITAL_COMMUNITY)
Admission: RE | Admit: 2017-02-11 | Discharge: 2017-02-11 | Disposition: A | Discharge status: Home / Self Care | Payer: PPO | Source: Ambulatory Visit | Attending: Physician Assistant | Admitting: Physician Assistant

## 2017-02-11 DIAGNOSIS — Z95820 Peripheral vascular angioplasty status with implants and grafts: Secondary | ICD-10-CM | POA: Diagnosis not present

## 2017-02-11 DIAGNOSIS — M25552 Pain in left hip: Secondary | ICD-10-CM

## 2017-02-11 DIAGNOSIS — I2510 Atherosclerotic heart disease of native coronary artery without angina pectoris: Secondary | ICD-10-CM | POA: Insufficient documentation

## 2017-02-11 DIAGNOSIS — S7002XA Contusion of left hip, initial encounter: Secondary | ICD-10-CM | POA: Diagnosis not present

## 2017-02-11 DIAGNOSIS — M5136 Other intervertebral disc degeneration, lumbar region: Secondary | ICD-10-CM | POA: Insufficient documentation

## 2017-02-11 LAB — PROTIME-INR
INR: 4.1 — ABNORMAL HIGH
Prothrombin Time: 40.6 s — ABNORMAL HIGH (ref 9.0–11.5)

## 2017-02-11 LAB — TSH: TSH: 6.19 mIU/L — ABNORMAL HIGH (ref 0.40–4.50)

## 2017-02-16 ENCOUNTER — Encounter: Payer: Self-pay | Admitting: Surgery

## 2017-02-16 ENCOUNTER — Other Ambulatory Visit: Payer: Self-pay

## 2017-02-16 ENCOUNTER — Telehealth: Payer: Self-pay | Admitting: Internal Medicine

## 2017-02-16 ENCOUNTER — Ambulatory Visit (INDEPENDENT_AMBULATORY_CARE_PROVIDER_SITE_OTHER): Payer: PPO | Admitting: Surgery

## 2017-02-16 ENCOUNTER — Ambulatory Visit (HOSPITAL_COMMUNITY)
Admission: RE | Admit: 2017-02-16 | Discharge: 2017-02-16 | Disposition: A | Discharge status: Home / Self Care | Payer: PPO | Source: Ambulatory Visit | Attending: Surgery | Admitting: Surgery

## 2017-02-16 ENCOUNTER — Ambulatory Visit (INDEPENDENT_AMBULATORY_CARE_PROVIDER_SITE_OTHER)
Admission: RE | Admit: 2017-02-16 | Discharge: 2017-02-16 | Disposition: A | Discharge status: Home / Self Care | Payer: PPO | Source: Ambulatory Visit | Attending: Surgery | Admitting: Surgery

## 2017-02-16 VITALS — BP 127/62 | HR 69 | Temp 97.9°F | Resp 20 | Ht 67.0 in | Wt 218.0 lb

## 2017-02-16 DIAGNOSIS — N185 Chronic kidney disease, stage 5: Secondary | ICD-10-CM | POA: Diagnosis not present

## 2017-02-16 DIAGNOSIS — Z01810 Encounter for preprocedural cardiovascular examination: Secondary | ICD-10-CM

## 2017-02-16 NOTE — Telephone Encounter (Signed)
Noted hx of Aortic valve replacement (St Jude), A-Fib, V-tach and hypertension.  Recommendations:  1. Hold warfarin 5 days prior to procedure  2. Need bridging therapy with enoxaparin (lovenox)  Patient is not followed by our anticoagulation clinic, please verify if patient able to get bridge plan from current provider. We will see patient for bridging if needed.

## 2017-02-16 NOTE — Progress Notes (Signed)
Vascular and Vein Specialist of [facility]  Patient name: Robert Stanton MRN: [id number] DOB: 08/07/1941 Sex: male   REFERRING PROVIDER:    Dr. Posey Pronto   REASON FOR CONSULT:    Renal access  HISTORY OF PRESENT ILLNESS:   Robert Stanton is a 76 y.o. male, who is Referred today for dialysis access.  He is not yet on dialysis.  He is right-handed.  The patient has a history of coronary artery disease.  He is status post CABG with mechanical St. Jude aortic valve in 2000.  He is on chronic anticoagulation.  He has ischemic cardiomyopathy with an ejection fraction of 40%.  He suffers from peripheral vascular disease, having undergone multiple lower extremity bypass grafts in the past.  He has a history of atrial fibrillation.  He does take a statin for cholesterol and anemia.  He is medically managed for hypertension.  PAST MEDICAL HISTORY    Past Medical History:  Diagnosis Date  . AAA (abdominal aortic aneurysm) ([place])   . CAD (coronary artery disease)    a. CABG '00. b. all SVGs occluded 2010. c. low risk Nuc 2012   . Cataracts, bilateral   . Chronic anticoagulation   . CKD (chronic kidney disease), stage III   . COPD (chronic obstructive pulmonary disease) ([place])   . GERD (gastroesophageal reflux disease)   . Gout   . Hiatal hernia   . History of Doppler ultrasound 04/05/2013   LEAs; small distal abd. aoritc aneuysm is stable; fem-pop graft to R leg has 50-69% blockage  . History of echocardiogram 04/17/2013   EF 40-45%; LV hypertrophy; LV mild-mod dilated; AV regurg.; LA severely diated, RA mildly dilated  . History of nuclear stress test 05/14/2011   Dipyridamole; moderate perfusion defect in Basal Infrioer & Mid Inferior region - consistent w/infarct or scar; global LV systolic function mildly reduced; EKG negative for ischemia; low risk scan  . Hyperlipidemia   . Hypertension   . Ischemic cardiomyopathy   . Kidney cysts   . Mechanical  heart valve present    a. Hx mechanical St Jude AVR 2000.  [name] OSA (obstructive sleep apnea)   . Peripheral arterial disease ([place])    a. s/p aorto bifem BG;  b. 11/2013 PVA distal R Fem-fem stenosis;  c. 12/2013 PTA of dist R femoral bypass graft.  . Permanent atrial fibrillation ([place])   . Prediabetes   . Ventricular tachycardia (paroxysmal) ([place])    Seen by Dr. Lovena Le with EP, started on amiodarone  . Vitamin B deficiency      FAMILY HISTORY   Family History  Problem Relation Age of Onset  . Heart attack Mother   . Heart attack Father   . Lung cancer Sister     x 2  . Kidney cancer Sister   . Kidney disease Sister   . Cancer Brother     ?  . Stroke Brother     SOCIAL HISTORY:   Social History   Social History  . Marital status: Married    Spouse name: N/A  . Number of children: 3  . Years of education: N/A   Occupational History  . Not on file.   Social History Main Topics  . Smoking status: Current Every Day Smoker    Packs/day: 0.05    Years: 40.00    Types: Cigarettes  . Smokeless tobacco: Never Used  . Alcohol use No  . Drug use: No  . Sexual activity: Not on file  Other Topics Concern  . Not on file   Social History Narrative   Lives in [place] with disabled wife, three sons, and grandson   Retired    ALLERGIES:    Allergies  Allergen Reactions  . Chantix [Varenicline] Nausea And Vomiting  . Lyrica [Pregabalin]     Swelling, couldn't breathe  . Nsaids Other (See Comments)    Cannot take while on coumadin  . Simvastatin     Pt said he had a "bleed out" after taking it  . Ziac [Bisoprolol-Hydrochlorothiazide]     fatigue    CURRENT MEDICATIONS:    Current Outpatient Prescriptions  Medication Sig Dispense Refill  . albuterol (PROVENTIL) (2.5 MG/3ML) 0.083% nebulizer solution Take 3 mLs (2.5 mg total) by nebulization every 4 (four) hours as needed for wheezing or shortness of breath. 30 vial 0  . allopurinol (ZYLOPRIM) 300 MG  tablet TAKE 1 TABLET BY MOUTH DAILY. TO PREVENT GOUT 90 tablet 1  . amiodarone (PACERONE) 200 MG tablet TAKE 1 TABLET (200 MG TOTAL) BY MOUTH DAILY. 30 tablet 6  . aspirin EC 81 MG tablet Take 81 mg by mouth daily.    . bumetanide (BUMEX) 2 MG tablet Take 1/2 tablet daily as needed for swelling 30 tablet 6  . Cholecalciferol (VITAMIN D3) 5000 units TABS Take 10,000 Units by mouth daily.    . clotrimazole-betamethasone (LOTRISONE) cream Apply 1 application topically daily as needed (rash/ skin irritation).    . fenofibrate (TRICOR) 145 MG tablet TAKE 1 TABLET DAILY FOR BLOOD FATS 90 tablet 3  . isosorbide mononitrate (IMDUR) 30 MG 24 hr tablet TAKE 1 TABLET BY MOUTH EVERY DAY 90 tablet 1  . levothyroxine (SYNTHROID, LEVOTHROID) 50 MCG tablet TAKE 1 TABLET BY MOUTH EVERY DAY 90 tablet 1  . montelukast (SINGULAIR) 10 MG tablet TAKE 1 TABLET (10 MG TOTAL) BY MOUTH DAILY. 90 tablet 1  . NITROSTAT 0.4 MG SL tablet PLACE 1 TABLET (0.4 MG TOTAL) UNDER THE TONGUE EVERY 5 (FIVE) MINUTES AS NEEDED FOR CHEST PAIN. 25 tablet 5  . pravastatin (PRAVACHOL) 40 MG tablet TAKE 1 TABLET (40 MG TOTAL) BY MOUTH EVERY MORNING. 90 tablet 1  . PRESCRIPTION MEDICATION Inhale into the lungs See admin instructions. Use CPAP whenever sleeping    . sertraline (ZOLOFT) 50 MG tablet TAKE 1 TABLET (50 MG TOTAL) BY MOUTH DAILY. 90 tablet 1  . verapamil (CALAN) 80 MG tablet TAKE 1 TABLET (80 MG TOTAL) BY MOUTH 2 (TWO) TIMES DAILY. 180 tablet 1  . vitamin B-12 (CYANOCOBALAMIN) 1000 MCG tablet Take 1,000 mcg by mouth daily.    [name] warfarin (COUMADIN) 1 MG tablet TAKE 1 TABLET (1 MG TOTAL) BY MOUTH DAILY. 30 tablet 0  . warfarin (COUMADIN) 5 MG tablet Take 1 tablet daily or as directed by your doctor. 90 tablet 3   No current facility-administered medications for this visit.     REVIEW OF SYSTEMS:   [X]  denotes positive finding, [ ]  denotes negative finding Cardiac  Comments:  Chest pain or chest pressure:    Shortness of breath  upon exertion: x   Short of breath when lying flat:    Irregular heart rhythm: x       Vascular    Pain in calf, thigh, or hip brought on by ambulation: x   Pain in feet at night that wakes you up from your sleep:     Blood clot in your veins:    Leg swelling:  x  Pulmonary    Oxygen at home:    Productive cough:     Wheezing:         Neurologic    Sudden weakness in arms or legs:     Sudden numbness in arms or legs:     Sudden onset of difficulty speaking or slurred speech:    Temporary loss of vision in one eye:     Problems with dizziness:         Gastrointestinal    Blood in stool:      Vomited blood:         Genitourinary    Burning when urinating:     Blood in urine:        Psychiatric    Major depression:         Hematologic    Bleeding problems: x   Problems with blood clotting too easily: x       Skin    Rashes or ulcers:        Constitutional    Fever or chills:     PHYSICAL EXAM:   Vitals:   02/16/17 1232 02/16/17 1238  BP: 138/61 127/62  Pulse: 69   Resp: 20   Temp: 97.9 F (36.6 C)   TempSrc: Oral   SpO2: 99%   Weight: 218 lb (98.9 kg)   Height: 5\' 7"  (1.702 m)     GENERAL: The patient is a well-nourished male, in no acute distress. The vital signs are documented above. CARDIAC: There is a regular rate and rhythm.  VASCULAR: Palpable left brachial and radial pulse PULMONARY: Nonlabored respirations  MUSCULOSKELETAL: There are no major deformities or cyanosis. NEUROLOGIC: No focal weakness or paresthesias are detected. SKIN: There are no ulcers or rashes noted. PSYCHIATRIC: The patient has a normal affect.  STUDIES:   I have reviewed vein mapping studies.  This shows an adequate cephalic vein beginning at the antecubital fossa.  ASSESSMENT and PLAN   Chronic renal insufficiency: The patient will be scheduled for a left brachiocephalic fistula.  I discussed the risks and benefits of the procedure with the patient and his son  including the risk of steal syndrome and non-maturity.  We also discussed need for future interventions.  Because of the patient's mechanical valve, he will need to have Dr. [name] his cardiologist take recommendations regarding transition to coming off of Coumadin for surgery.  Once this is been determined, the patient will be scheduled for surgery.   Annamarie Major, MD Vascular and Vein Specialists of [facility] [phone] Pager [phone]

## 2017-02-16 NOTE — Telephone Encounter (Signed)
Will forward to dr hilty and pharm md to review and advise.

## 2017-02-16 NOTE — Telephone Encounter (Signed)
New message    Request for surgical clearance:  1. What type of surgery is being performed? Left arm fistula   2. When is this surgery scheduled? Has not been scheduled  3. Are there any medications that need to be held prior to surgery and how long? Coumadin  4. Name of physician performing surgery? Dr. Chaya Jan  What is your office phone and fax number? [phone]

## 2017-02-16 NOTE — Telephone Encounter (Signed)
Will forward this note to the number provided. 

## 2017-02-21 ENCOUNTER — Other Ambulatory Visit: Payer: Self-pay | Admitting: Internal Medicine

## 2017-02-22 ENCOUNTER — Other Ambulatory Visit: Payer: Self-pay | Admitting: Internal Medicine

## 2017-02-23 ENCOUNTER — Other Ambulatory Visit: Payer: Self-pay | Admitting: *Deleted

## 2017-02-23 ENCOUNTER — Other Ambulatory Visit: Payer: Self-pay | Admitting: Internal Medicine

## 2017-02-23 ENCOUNTER — Telehealth: Payer: Self-pay | Admitting: *Deleted

## 2017-02-23 DIAGNOSIS — I10 Essential (primary) hypertension: Secondary | ICD-10-CM

## 2017-02-23 DIAGNOSIS — R609 Edema, unspecified: Secondary | ICD-10-CM

## 2017-02-23 MED ORDER — ENOXAPARIN SODIUM 150 MG/ML ~~LOC~~ SOLN: 150.0000 mg | SUBCUTANEOUS | 0 refills | Status: DC

## 2017-02-23 MED ORDER — BUMETANIDE 2 MG PO TABS: ORAL_TABLET | ORAL | 6 refills | Status: DC

## 2017-02-23 NOTE — Telephone Encounter (Signed)
REFILL 

## 2017-02-23 NOTE — Telephone Encounter (Signed)
Patient came to the office to get an instruction sheet regarding how to stop his Coumadin and bridge to Lovenox, for his upcoming vascular procedure. Patient received the printed instructions and voiced understanding.

## 2017-02-24 ENCOUNTER — Encounter: Payer: Self-pay | Admitting: Surgery

## 2017-02-25 ENCOUNTER — Other Ambulatory Visit: Payer: Self-pay | Admitting: Internal Medicine

## 2017-02-25 DIAGNOSIS — I10 Essential (primary) hypertension: Secondary | ICD-10-CM

## 2017-02-25 MED ORDER — FUROSEMIDE 40 MG PO TABS: ORAL_TABLET | ORAL | 1 refills | Status: DC

## 2017-03-02 DIAGNOSIS — D631 Anemia in chronic kidney disease: Secondary | ICD-10-CM | POA: Diagnosis not present

## 2017-03-02 DIAGNOSIS — N184 Chronic kidney disease, stage 4 (severe): Secondary | ICD-10-CM | POA: Diagnosis not present

## 2017-03-03 ENCOUNTER — Other Ambulatory Visit: Payer: PPO

## 2017-03-03 ENCOUNTER — Encounter (HOSPITAL_COMMUNITY): Payer: Self-pay | Admitting: *Deleted

## 2017-03-03 ENCOUNTER — Telehealth: Payer: Self-pay | Admitting: *Deleted

## 2017-03-03 ENCOUNTER — Other Ambulatory Visit: Payer: Self-pay | Admitting: Internal Medicine

## 2017-03-03 DIAGNOSIS — Z5181 Encounter for therapeutic drug level monitoring: Secondary | ICD-10-CM | POA: Diagnosis not present

## 2017-03-03 DIAGNOSIS — I482 Chronic atrial fibrillation, unspecified: Secondary | ICD-10-CM

## 2017-03-03 DIAGNOSIS — Z7901 Long term (current) use of anticoagulants: Secondary | ICD-10-CM

## 2017-03-03 LAB — PROTIME-INR
INR: 1.3 — AB
Prothrombin Time: 14.1 s — ABNORMAL HIGH (ref 9.0–11.5)

## 2017-03-03 NOTE — Progress Notes (Signed)
Anesthesia Chart Review:  Pt is a same day work up.   Pt is a 76 year old male scheduled for L AV fistula creation on 03/04/2017 with Harold Barban, M.D.  - PCP is Unk Pinto, MD - Cardiologist is Lyman Bishop, MD, last office visit 11/22/16 - Nephrologist is Elmarie Shiley, MD  PMH includes: CAD (CABG 2000), ischemic cardiomyopathy, permanent atrial fibrillation, paroxysmal ventricular tachycardia, aortic stenosis (s/p AVR with St. Jude mechanical valve 2000), HTN, PAD (s/p R femoral to L femoral artery and B femoral to popliteal bypass grafts), AAA, CKD (stage V), pre-diabetes, hyperlipidemia, COPD, GERD. Current smoker.  Medications include: Albuterol, amiodarone, ASA 81 mg, Bumex, Lovenox, fenofibrate, Lasix, Imdur, levothyroxine, pravastatin, verapamil, Coumadin.  Coumadin held 5 days before surgery, lovenox started 02/23/17.   Labs will be obtained DOS.   EKG 11/22/16: atrial fibrillation with SVR. Rightward axis. Nonspecific intraventricular block.   AAA duplex 09/29/16:  - Stable dimensions of the infrarenal fusiform AAA measuring 2.7 cm x 3.0 cm. - Normal caliber common and external iliac arteries, bilaterally. - Aorto-iliac atherosclerosis. - >50% right common and right external iliac arteries. - No flow noted in the left external iliac artery.  Echo 05/20/16:  - Left ventricle: The cavity size was mildly dilated. Wall thickness was normal. The estimated ejection fraction was 45%. Basal to mid inferior severe hypokinesis. Inferolateral wall poorly visualized. Indeterminant diastolic function. - Aortic valve: St Jude mechanical aortic valve. Mildly elevated gradient across the mechanical aortic valve. There was trivial regurgitation. Mean gradient (S): 19 mm Hg. Peak gradient (S): 31 mm Hg. - Mitral valve: Mildly to moderately calcified annulus. There was trivial regurgitation. - Left atrium: The atrium was mildly dilated. - Right ventricle: The cavity size was normal. Systolic  function was mildly reduced. - Right atrium: The atrium was mildly dilated. - Tricuspid valve: Peak RV-RA gradient (S): 30 mm Hg. - Pulmonary arteries: PA peak pressure: 45 mm Hg (S). - Systemic veins: IVC measured 2.4 cm with < 50% respirophasic variation, suggesting RA pressure 15 mmHg.  - Impressions: Mildly dilated LV with EF 45%. Basal to mid inferior severe hypokinesis, inferolateral wall poorly visualized. Normal RV size with mildly decreased systolic function. Mild pulmonary hypertension. Dilated IVC suggesting elevated RV filling pressure. Mechanical aortic valve with mildly elevated mean gradient.  Carotid duplex 04/22/16:  - Technically challenging study. - Heterogeneous plaque, bilaterally. - 40-59% RICA stenosis. - 4-09% LICA stenosis. - >50% RECA stenosis. - Normal subclavian arteries, bilaterally. - Patent vertebral arteries with antegrade flow.  Nuclear stress test 05/14/11:  - no significant ischemia noted. Low risk scan. There is a scar in the RCA territory without associated ischemia unchanged from prior study.   Cardiac cath 11/25/08:  1. Grafts.  The internal mammary artery to the LAD was widely patent. The LAD was well visualized, had only minimal irregularities. There was some collateral visualization of both the small OM and the RCA. 2. SVG to the RCA, 100% occluded at its ostium. 3. SVG to OM, 100% occluded at its ostium. 4. Native RCA, 100% occluded proximally. 5. Left main.  There was terminal 40-50% narrowing of the LM noted in the LAO cranial view only.  When compared to the 2005 cardiac cath, this has not significantly changed. 6. LAD.  The LAD system gives rise to a first diagonal branch that is large and free of significant disease.  The LAD itself is 100% occluded right after the diagonal, but is well visualized via the internal mammary artery to the  LAD.  There was a large left-to-right collateral bed noted from the left circulation. 7. Circumflex.  There  appeared to be some ostial narrowing of the circumflex and it was mildly hyperlucent (40-50%).  This too is unchanged from the 2005 cath.  OM #1 had proximal 30% and 40% areas of narrowing and the distal OM bifurcated once free of disease.  OM #2 was also a bifurcating vessel and free of disease.  There were large left-to-right collaterals. - CONCLUSION:  Loss of 2/3 bypass grafts with patent LIMA to the LAD.  There is large collateral filling of the RCA via both the native and the internal mammary artery to the LAD.  If labs acceptable DOS, I anticipate pt can proceed as scheduled.   Willeen Cass, FNP-BC [facility] Short Stay [facility]/Anesthesiology Phone: [phone] 03/03/2017 12:51 PM

## 2017-03-03 NOTE — Progress Notes (Signed)
Pt SDW-Pre-op call was completed by both pt and pt son, Audelia Acton, with pt verbal consent. Pt is HOH and does not wear hearing aids. Pt denies SOB and chest pain but is under the care of Dr. [name], Cardiology. Pt denies having a chest x ray within the last year. Pt made aware to stop taking vitamins, fish oil, and herbal medications. Do not take any NSAIDs ie: Ibuprofen, Advil, Naproxen, BC and Goody Powder. Pt stated that his last dose of Coumadin was 5/ 12/18 and last dose of Lovenox was this morning as instructed by MD. Audelia Acton wrote down all of the pre-op instructions and verbalized understanding. Anesthesia asked to review pt history ( see note).

## 2017-03-03 NOTE — Telephone Encounter (Signed)
Patient advised to continue his Lovenox schedule and the protime result was sent to Vein and Vascular at [phone].

## 2017-03-04 ENCOUNTER — Encounter (HOSPITAL_COMMUNITY)
Admission: RE | Disposition: A | Discharge status: Home / Self Care | Payer: Self-pay | Source: Ambulatory Visit | Attending: Surgery

## 2017-03-04 ENCOUNTER — Ambulatory Visit (HOSPITAL_COMMUNITY): Payer: PPO | Admitting: Emergency Medicine

## 2017-03-04 ENCOUNTER — Encounter (HOSPITAL_COMMUNITY): Payer: Self-pay | Admitting: *Deleted

## 2017-03-04 ENCOUNTER — Ambulatory Visit (HOSPITAL_COMMUNITY)
Admission: RE | Admit: 2017-03-04 | Discharge: 2017-03-04 | Disposition: A | Discharge status: Home / Self Care | Payer: PPO | Source: Ambulatory Visit | Attending: Surgery | Admitting: Surgery

## 2017-03-04 DIAGNOSIS — I482 Chronic atrial fibrillation: Secondary | ICD-10-CM | POA: Diagnosis not present

## 2017-03-04 DIAGNOSIS — Z8249 Family history of ischemic heart disease and other diseases of the circulatory system: Secondary | ICD-10-CM | POA: Diagnosis not present

## 2017-03-04 DIAGNOSIS — M109 Gout, unspecified: Secondary | ICD-10-CM | POA: Insufficient documentation

## 2017-03-04 DIAGNOSIS — I272 Pulmonary hypertension, unspecified: Secondary | ICD-10-CM | POA: Diagnosis not present

## 2017-03-04 DIAGNOSIS — N189 Chronic kidney disease, unspecified: Secondary | ICD-10-CM | POA: Diagnosis not present

## 2017-03-04 DIAGNOSIS — Z952 Presence of prosthetic heart valve: Secondary | ICD-10-CM | POA: Diagnosis not present

## 2017-03-04 DIAGNOSIS — Z95820 Peripheral vascular angioplasty status with implants and grafts: Secondary | ICD-10-CM | POA: Insufficient documentation

## 2017-03-04 DIAGNOSIS — E669 Obesity, unspecified: Secondary | ICD-10-CM | POA: Diagnosis not present

## 2017-03-04 DIAGNOSIS — E785 Hyperlipidemia, unspecified: Secondary | ICD-10-CM | POA: Diagnosis not present

## 2017-03-04 DIAGNOSIS — Z79899 Other long term (current) drug therapy: Secondary | ICD-10-CM | POA: Diagnosis not present

## 2017-03-04 DIAGNOSIS — Z951 Presence of aortocoronary bypass graft: Secondary | ICD-10-CM | POA: Insufficient documentation

## 2017-03-04 DIAGNOSIS — Z6834 Body mass index (BMI) 34.0-34.9, adult: Secondary | ICD-10-CM | POA: Insufficient documentation

## 2017-03-04 DIAGNOSIS — E7800 Pure hypercholesterolemia, unspecified: Secondary | ICD-10-CM | POA: Diagnosis not present

## 2017-03-04 DIAGNOSIS — G4733 Obstructive sleep apnea (adult) (pediatric): Secondary | ICD-10-CM | POA: Insufficient documentation

## 2017-03-04 DIAGNOSIS — N185 Chronic kidney disease, stage 5: Secondary | ICD-10-CM | POA: Diagnosis not present

## 2017-03-04 DIAGNOSIS — I25118 Atherosclerotic heart disease of native coronary artery with other forms of angina pectoris: Secondary | ICD-10-CM | POA: Insufficient documentation

## 2017-03-04 DIAGNOSIS — J449 Chronic obstructive pulmonary disease, unspecified: Secondary | ICD-10-CM | POA: Insufficient documentation

## 2017-03-04 DIAGNOSIS — I472 Ventricular tachycardia: Secondary | ICD-10-CM | POA: Diagnosis not present

## 2017-03-04 DIAGNOSIS — E119 Type 2 diabetes mellitus without complications: Secondary | ICD-10-CM | POA: Diagnosis not present

## 2017-03-04 DIAGNOSIS — I255 Ischemic cardiomyopathy: Secondary | ICD-10-CM | POA: Insufficient documentation

## 2017-03-04 DIAGNOSIS — I25110 Atherosclerotic heart disease of native coronary artery with unstable angina pectoris: Secondary | ICD-10-CM | POA: Diagnosis not present

## 2017-03-04 DIAGNOSIS — F17210 Nicotine dependence, cigarettes, uncomplicated: Secondary | ICD-10-CM | POA: Diagnosis not present

## 2017-03-04 DIAGNOSIS — Z7982 Long term (current) use of aspirin: Secondary | ICD-10-CM | POA: Diagnosis not present

## 2017-03-04 DIAGNOSIS — Z7901 Long term (current) use of anticoagulants: Secondary | ICD-10-CM | POA: Diagnosis not present

## 2017-03-04 DIAGNOSIS — I120 Hypertensive chronic kidney disease with stage 5 chronic kidney disease or end stage renal disease: Secondary | ICD-10-CM | POA: Diagnosis not present

## 2017-03-04 DIAGNOSIS — E039 Hypothyroidism, unspecified: Secondary | ICD-10-CM | POA: Insufficient documentation

## 2017-03-04 HISTORY — PX: AV FISTULA PLACEMENT: SHX1204

## 2017-03-04 HISTORY — DX: Unspecified hearing loss, unspecified ear: H91.90

## 2017-03-04 HISTORY — DX: Hypothyroidism, unspecified: E03.9

## 2017-03-04 HISTORY — DX: Unspecified osteoarthritis, unspecified site: M19.90

## 2017-03-04 LAB — POCT I-STAT 4, (NA,K, GLUC, HGB,HCT)
GLUCOSE: 102 mg/dL — AB (ref 65–99)
HEMATOCRIT: 30 % — AB (ref 39.0–52.0)
HEMOGLOBIN: 10.2 g/dL — AB (ref 13.0–17.0)
POTASSIUM: 3.5 mmol/L (ref 3.5–5.1)
SODIUM: 140 mmol/L (ref 135–145)

## 2017-03-04 LAB — PROTIME-INR
INR: 1.38
Prothrombin Time: 17.1 seconds — ABNORMAL HIGH (ref 11.4–15.2)

## 2017-03-04 LAB — APTT: aPTT: 44 seconds — ABNORMAL HIGH (ref 24–36)

## 2017-03-04 SURGERY — ARTERIOVENOUS (AV) FISTULA CREATION
Anesthesia: Monitor Anesthesia Care | Site: Arm Upper | Laterality: Left

## 2017-03-04 MED ORDER — PROTAMINE SULFATE 10 MG/ML IV SOLN
INTRAVENOUS | Status: DC | PRN
  Administered 2017-03-04: 25 mg via INTRAVENOUS

## 2017-03-04 MED ORDER — FENTANYL CITRATE (PF) 250 MCG/5ML IJ SOLN
INTRAMUSCULAR | Status: AC
  Filled 2017-03-04: qty 5

## 2017-03-04 MED ORDER — DEXTROSE 5 % IV SOLN
1.5000 g | INTRAVENOUS | Status: AC
  Administered 2017-03-04: 1.5 g via INTRAVENOUS
  Filled 2017-03-04: qty 1.5

## 2017-03-04 MED ORDER — CHLORHEXIDINE GLUCONATE CLOTH 2 % EX PADS: 6.0000 | MEDICATED_PAD | Freq: Once | CUTANEOUS | Status: DC

## 2017-03-04 MED ORDER — OXYCODONE-ACETAMINOPHEN 5-325 MG PO TABS: 1.0000 | ORAL_TABLET | Freq: Four times a day (QID) | ORAL | 0 refills | Status: DC | PRN

## 2017-03-04 MED ORDER — LIDOCAINE-EPINEPHRINE (PF) 1 %-1:200000 IJ SOLN
INTRAMUSCULAR | Status: AC
  Filled 2017-03-04: qty 30

## 2017-03-04 MED ORDER — PROPOFOL 10 MG/ML IV BOLUS
INTRAVENOUS | Status: DC | PRN
  Administered 2017-03-04: 20 mg via INTRAVENOUS
  Administered 2017-03-04: 10 mg via INTRAVENOUS

## 2017-03-04 MED ORDER — 0.9 % SODIUM CHLORIDE (POUR BTL) OPTIME
TOPICAL | Status: DC | PRN
  Administered 2017-03-04: 1000 mL

## 2017-03-04 MED ORDER — HEPARIN SODIUM (PORCINE) 1000 UNIT/ML IJ SOLN
INTRAMUSCULAR | Status: DC | PRN
  Administered 2017-03-04: 3000 [IU] via INTRAVENOUS

## 2017-03-04 MED ORDER — LIDOCAINE HCL (CARDIAC) 20 MG/ML IV SOLN
INTRAVENOUS | Status: DC | PRN
  Administered 2017-03-04: 20 mg via INTRATRACHEAL

## 2017-03-04 MED ORDER — PROPOFOL 10 MG/ML IV BOLUS
INTRAVENOUS | Status: AC
  Filled 2017-03-04: qty 40

## 2017-03-04 MED ORDER — ONDANSETRON HCL 4 MG/2ML IJ SOLN: 4.0000 mg | Freq: Once | INTRAMUSCULAR | Status: DC | PRN

## 2017-03-04 MED ORDER — SODIUM CHLORIDE 0.9 % IV SOLN
INTRAVENOUS | Status: DC
  Administered 2017-03-04: 07:00:00 via INTRAVENOUS

## 2017-03-04 MED ORDER — FENTANYL CITRATE (PF) 100 MCG/2ML IJ SOLN: 25.0000 ug | INTRAMUSCULAR | Status: DC | PRN

## 2017-03-04 MED ORDER — FENTANYL CITRATE (PF) 250 MCG/5ML IJ SOLN
INTRAMUSCULAR | Status: DC | PRN
  Administered 2017-03-04: 50 ug via INTRAVENOUS
  Administered 2017-03-04 (×3): 25 ug via INTRAVENOUS

## 2017-03-04 MED ORDER — MIDAZOLAM HCL 2 MG/2ML IJ SOLN
INTRAMUSCULAR | Status: AC
  Filled 2017-03-04: qty 2

## 2017-03-04 MED ORDER — SODIUM CHLORIDE 0.9 % IV SOLN
INTRAVENOUS | Status: DC | PRN
  Administered 2017-03-04: 08:00:00

## 2017-03-04 MED ORDER — LIDOCAINE-EPINEPHRINE (PF) 1 %-1:200000 IJ SOLN
INTRAMUSCULAR | Status: DC | PRN
  Administered 2017-03-04: 7 mL

## 2017-03-04 MED ORDER — LIDOCAINE 2% (20 MG/ML) 5 ML SYRINGE
INTRAMUSCULAR | Status: AC
  Filled 2017-03-04: qty 5

## 2017-03-04 MED ORDER — HEPARIN SODIUM (PORCINE) 1000 UNIT/ML IJ SOLN
INTRAMUSCULAR | Status: AC
  Filled 2017-03-04: qty 2

## 2017-03-04 SURGICAL SUPPLY — 34 items
ARMBAND PINK RESTRICT EXTREMIT (MISCELLANEOUS) ×6 IMPLANT
CANISTER SUCT 3000ML PPV (MISCELLANEOUS) ×3 IMPLANT
CLIP TI MEDIUM 6 (CLIP) ×3 IMPLANT
CLIP TI WIDE RED SMALL 6 (CLIP) ×3 IMPLANT
COVER PROBE W GEL 5X96 (DRAPES) ×3 IMPLANT
DERMABOND ADVANCED (GAUZE/BANDAGES/DRESSINGS) ×2
DERMABOND ADVANCED .7 DNX12 (GAUZE/BANDAGES/DRESSINGS) ×1 IMPLANT
ELECT REM PT RETURN 9FT ADLT (ELECTROSURGICAL) ×3
ELECTRODE REM PT RTRN 9FT ADLT (ELECTROSURGICAL) ×1 IMPLANT
GLOVE BIO SURGEON STRL SZ 6.5 (GLOVE) ×2 IMPLANT
GLOVE BIO SURGEONS STRL SZ 6.5 (GLOVE) ×1
GLOVE BIOGEL PI IND STRL 6.5 (GLOVE) ×2 IMPLANT
GLOVE BIOGEL PI IND STRL 7.0 (GLOVE) ×1 IMPLANT
GLOVE BIOGEL PI IND STRL 7.5 (GLOVE) ×1 IMPLANT
GLOVE BIOGEL PI INDICATOR 6.5 (GLOVE) ×4
GLOVE BIOGEL PI INDICATOR 7.0 (GLOVE) ×2
GLOVE BIOGEL PI INDICATOR 7.5 (GLOVE) ×2
GLOVE SURG SS PI 7.5 STRL IVOR (GLOVE) ×3 IMPLANT
GOWN STRL REUS W/ TWL LRG LVL3 (GOWN DISPOSABLE) ×3 IMPLANT
GOWN STRL REUS W/ TWL XL LVL3 (GOWN DISPOSABLE) ×1 IMPLANT
GOWN STRL REUS W/TWL LRG LVL3 (GOWN DISPOSABLE) ×6
GOWN STRL REUS W/TWL XL LVL3 (GOWN DISPOSABLE) ×2
HEMOSTAT SNOW SURGICEL 2X4 (HEMOSTASIS) IMPLANT
KIT BASIN OR (CUSTOM PROCEDURE TRAY) ×3 IMPLANT
KIT ROOM TURNOVER OR (KITS) ×3 IMPLANT
NS IRRIG 1000ML POUR BTL (IV SOLUTION) ×3 IMPLANT
PACK CV ACCESS (CUSTOM PROCEDURE TRAY) ×3 IMPLANT
PAD ARMBOARD 7.5X6 YLW CONV (MISCELLANEOUS) ×6 IMPLANT
SUT PROLENE 6 0 CC (SUTURE) ×3 IMPLANT
SUT VIC AB 3-0 SH 27 (SUTURE) ×2
SUT VIC AB 3-0 SH 27X BRD (SUTURE) ×1 IMPLANT
SUT VICRYL 4-0 PS2 18IN ABS (SUTURE) ×3 IMPLANT
UNDERPAD 30X30 (UNDERPADS AND DIAPERS) ×3 IMPLANT
WATER STERILE IRR 1000ML POUR (IV SOLUTION) ×3 IMPLANT

## 2017-03-04 NOTE — Op Note (Signed)
    Patient name: Robert Stanton MRN: [id number] DOB: 1941-02-24 Sex: male  03/04/2017 Pre-operative Diagnosis: CKD Post-operative diagnosis:  Same Surgeon:  Annamarie Major Assistants:  Silva Bandy Procedure:   Left brachio-cephalic AV fistula Anesthesia:  Mac Blood Loss:  See anesthesia record Specimens:  None  Findings:  Excellent appearing vein which dilated to greater than 4 mm.  The brachial artery was healthy and 3 mm  Indications:  The patient is here today for dialysis access.  Was a benefits of the procedure were discussed with the patient and his son preoperatively.  Procedure:  The patient was identified in the holding area and taken to [place] 12  The patient was then placed supine on the table. general anesthesia was administered.  The patient was prepped and draped in the usual sterile fashion.  A time out was called and antibiotics were administered.   ultrasound was used to evaluate the cephalic vein.  It appeared to be healthy beginning at the antecubital crease.  One percent lidocaine was used for local anesthesia.  A transverse incision was made just proximal to the antecubital crease.  I dissected out the cephalic vein.  This was a healthy 3 mm vein.  It was mobilized proximally and distally.  Side branches were divided between silk ties.  It was marked with a pen for orientation.  Next the brachial artery was dissected free.  This was a 3 mm disease-free artery.  3000 units of heparin were given.  The cephalic vein was ligated distally.  It was distended with heparinized saline.  It distended nicely.  The brachial artery was then occluded with vascular clamps.  A #11 blade was used to make an arteriotomy which was extended longitudinally with Potts scissors.  A running end-to-side anastomosis was created with 6-0 Prolene after spatulating the vein.  Prior to completion the appropriate flushing maneuvers were performed and the anastomosis was completed.  There was excellent  thrill within the fistula.  The patient had excellent Doppler signals at the wrist on the radial and ulnar artery.  25 mHz protamine was given.  Once hemostasis was satisfactory, the incision was closed with 2 layers of 3-0 Vicryl followed by Dermabond.  I also inspected the course of the vein to make sure there were no additional branches were kinks.   Disposition:  To PACU Stable   V. Annamarie Major, M.D. [facility] and Vein Specialists of [place] Office: [phone] Pager:  [phone]

## 2017-03-04 NOTE — Anesthesia Preprocedure Evaluation (Addendum)
Anesthesia Evaluation  Patient identified by MRN, date of birth, ID band Patient awake    Reviewed: Allergy & Precautions, NPO status , Patient's Chart, lab work & pertinent test results  Airway Mallampati: III  TM Distance: >3 FB Neck ROM: Full    Dental  (+) Edentulous Upper, Edentulous Lower   Pulmonary sleep apnea and Continuous Positive Airway Pressure Ventilation , COPD (controlled), Current Smoker,    Pulmonary exam normal breath sounds clear to auscultation       Cardiovascular hypertension, Pt. on medications + angina (stable, last episode 3 months ago) with exertion + CAD, + Cardiac Stents (2001), + CABG (2000) and +CHF  Normal cardiovascular exam+ dysrhythmias Atrial Fibrillation and Supra Ventricular Tachycardia  Rhythm:Regular Rate:Normal  ECG: a-fib, rate 57 ECHO: EF 40-45%; LV hypertrophy; LV mild-mod dilated; AV regurg.; LA severely diated, RA mildly dilated Sees Dr. [name], Cardiology.   Neuro/Psych negative neurological ROS  negative psych ROS   GI/Hepatic Neg liver ROS, GERD  ,  Endo/Other  Hypothyroidism   Renal/GU ESRFRenal disease  negative genitourinary   Musculoskeletal negative musculoskeletal ROS (+)   Abdominal   Peds negative pediatric ROS (+)  Hematology negative hematology ROS (+)   Anesthesia Other Findings Hyperlipidemia Obese Ambulates with cane  Reproductive/Obstetrics negative OB ROS                            Anesthesia Physical Anesthesia Plan  ASA: IV  Anesthesia Plan: MAC   Post-op Pain Management:    Induction: Intravenous  Airway Management Planned: Simple Face Mask  Additional Equipment:   Intra-op Plan:   Post-operative Plan: Extubation in OR  Informed Consent: I have reviewed the patients History and Physical, chart, labs and discussed the procedure including the risks, benefits and alternatives for the proposed anesthesia with the  patient or authorized representative who has indicated his/her understanding and acceptance.   Dental advisory given  Plan Discussed with: CRNA and Surgeon  Anesthesia Plan Comments:         Anesthesia Quick Evaluation

## 2017-03-04 NOTE — Anesthesia Postprocedure Evaluation (Signed)
Anesthesia Post Note  Patient: Robert Stanton  Procedure(s) Performed: Procedure(s) (LRB): ARTERIOVENOUS (AV) FISTULA CREATION LEFT ARM (Left)  Patient location during evaluation: PACU Anesthesia Type: MAC Level of consciousness: awake and alert Pain management: pain level controlled Vital Signs Assessment: post-procedure vital signs reviewed and stable Respiratory status: spontaneous breathing, nonlabored ventilation, respiratory function stable and patient connected to nasal cannula oxygen Cardiovascular status: stable and blood pressure returned to baseline Anesthetic complications: no       Last Vitals:  Vitals:   03/04/17 0940 03/04/17 0948  BP:  (!) 123/57  Pulse: 66 69  Resp: 15 16  Temp: 36.7 C     Last Pain:  Vitals:   03/04/17 0948  PainSc: 0-No pain                 Aluel Schwarz P Joshawa Dubin

## 2017-03-04 NOTE — Discharge Instructions (Signed)
Please restart your lovenox and coumadin as directed by the coumadin clinic.

## 2017-03-04 NOTE — H&P (Signed)
[facility]  Patient name: Robert Stanton     MRN: [id number]        DOB: 1940/11/15          Sex: male   REFERRING PROVIDER:    Dr. Posey Pronto   REASON FOR CONSULT:    Renal access  HISTORY OF PRESENT ILLNESS:   Robert Stanton is a 76 y.o. male, who is Referred today for dialysis access.  He is not yet on dialysis.  He is right-handed.  The patient has a history of coronary artery disease.  He is status post CABG with mechanical St. Jude aortic valve in 2000.  He is on chronic anticoagulation.  He has ischemic cardiomyopathy with an ejection fraction of 40%.  He suffers from peripheral vascular disease, having undergone multiple lower extremity bypass grafts in the past.  He has a history of atrial fibrillation.  He does take a statin for cholesterol and anemia.  He is medically managed for hypertension.  PAST MEDICAL HISTORY        Past Medical History:  Diagnosis Date  . AAA (abdominal aortic aneurysm) ([place])   . CAD (coronary artery disease)    a. CABG '00. b. all SVGs occluded 2010. c. low risk Nuc 2012   . Cataracts, bilateral   . Chronic anticoagulation   . CKD (chronic kidney disease), stage III   . COPD (chronic obstructive pulmonary disease) ([place])   . GERD (gastroesophageal reflux disease)   . Gout   . Hiatal hernia   . History of Doppler ultrasound 04/05/2013   LEAs; small distal abd. aoritc aneuysm is stable; fem-pop graft to R leg has 50-69% blockage  . History of echocardiogram 04/17/2013   EF 40-45%; LV hypertrophy; LV mild-mod dilated; AV regurg.; LA severely diated, RA mildly dilated  . History of nuclear stress test 05/14/2011   Dipyridamole; moderate perfusion defect in Basal Infrioer & Mid Inferior region - consistent w/infarct or scar; global LV systolic function mildly reduced; EKG negative for ischemia; low risk scan  . Hyperlipidemia   . Hypertension   . Ischemic cardiomyopathy     . Kidney cysts   . Mechanical heart valve present    a. Hx mechanical St Jude AVR 2000.  [name] OSA (obstructive sleep apnea)   . Peripheral arterial disease ([place])    a. s/p aorto bifem BG;  b. 11/2013 PVA distal R Fem-fem stenosis;  c. 12/2013 PTA of dist R femoral bypass graft.  . Permanent atrial fibrillation ([place])   . Prediabetes   . Ventricular tachycardia (paroxysmal) ([place])    Seen by Dr. Lovena Le with EP, started on amiodarone  . Vitamin B deficiency      FAMILY HISTORY         Family History  Problem Relation Age of Onset  . Heart attack Mother   . Heart attack Father   . Lung cancer Sister     x 2  . Kidney cancer Sister   . Kidney disease Sister   . Cancer Brother     ?  . Stroke Brother     SOCIAL HISTORY:   Social History        Social History  . Marital status: Married    Spouse name: N/A  . Number of children: 3  . Years of education: N/A      Occupational History  . Not on file.        Social History Main Topics  .  Smoking status: Current Every Day Smoker    Packs/day: 0.05    Years: 40.00    Types: Cigarettes  . Smokeless tobacco: Never Used  . Alcohol use No  . Drug use: No  . Sexual activity: Not on file       Other Topics Concern  . Not on file      Social History Narrative   Lives in [place] with disabled wife, three sons, and grandson   Retired    ALLERGIES:    Allergies  Allergen Reactions  . Chantix [Varenicline] Nausea And Vomiting  . Lyrica [Pregabalin]     Swelling, couldn't breathe  . Nsaids Other (See Comments)    Cannot take while on coumadin  . Simvastatin     Pt said he had a "bleed out" after taking it  . Ziac [Bisoprolol-Hydrochlorothiazide]     fatigue    CURRENT MEDICATIONS:          Current Outpatient Prescriptions  Medication Sig Dispense Refill  . albuterol (PROVENTIL) (2.5 MG/3ML) 0.083% nebulizer solution Take 3 mLs (2.5 mg  total) by nebulization every 4 (four) hours as needed for wheezing or shortness of breath. 30 vial 0  . allopurinol (ZYLOPRIM) 300 MG tablet TAKE 1 TABLET BY MOUTH DAILY. TO PREVENT GOUT 90 tablet 1  . amiodarone (PACERONE) 200 MG tablet TAKE 1 TABLET (200 MG TOTAL) BY MOUTH DAILY. 30 tablet 6  . aspirin EC 81 MG tablet Take 81 mg by mouth daily.    . bumetanide (BUMEX) 2 MG tablet Take 1/2 tablet daily as needed for swelling 30 tablet 6  . Cholecalciferol (VITAMIN D3) 5000 units TABS Take 10,000 Units by mouth daily.    . clotrimazole-betamethasone (LOTRISONE) cream Apply 1 application topically daily as needed (rash/ skin irritation).    . fenofibrate (TRICOR) 145 MG tablet TAKE 1 TABLET DAILY FOR BLOOD FATS 90 tablet 3  . isosorbide mononitrate (IMDUR) 30 MG 24 hr tablet TAKE 1 TABLET BY MOUTH EVERY DAY 90 tablet 1  . levothyroxine (SYNTHROID, LEVOTHROID) 50 MCG tablet TAKE 1 TABLET BY MOUTH EVERY DAY 90 tablet 1  . montelukast (SINGULAIR) 10 MG tablet TAKE 1 TABLET (10 MG TOTAL) BY MOUTH DAILY. 90 tablet 1  . NITROSTAT 0.4 MG SL tablet PLACE 1 TABLET (0.4 MG TOTAL) UNDER THE TONGUE EVERY 5 (FIVE) MINUTES AS NEEDED FOR CHEST PAIN. 25 tablet 5  . pravastatin (PRAVACHOL) 40 MG tablet TAKE 1 TABLET (40 MG TOTAL) BY MOUTH EVERY MORNING. 90 tablet 1  . PRESCRIPTION MEDICATION Inhale into the lungs See admin instructions. Use CPAP whenever sleeping    . sertraline (ZOLOFT) 50 MG tablet TAKE 1 TABLET (50 MG TOTAL) BY MOUTH DAILY. 90 tablet 1  . verapamil (CALAN) 80 MG tablet TAKE 1 TABLET (80 MG TOTAL) BY MOUTH 2 (TWO) TIMES DAILY. 180 tablet 1  . vitamin B-12 (CYANOCOBALAMIN) 1000 MCG tablet Take 1,000 mcg by mouth daily.    [name] warfarin (COUMADIN) 1 MG tablet TAKE 1 TABLET (1 MG TOTAL) BY MOUTH DAILY. 30 tablet 0  . warfarin (COUMADIN) 5 MG tablet Take 1 tablet daily or as directed by your doctor. 90 tablet 3   No current facility-administered medications for this visit.     REVIEW OF  SYSTEMS:   [X]  denotes positive finding, [ ]  denotes negative finding Cardiac  Comments:  Chest pain or chest pressure:    Shortness of breath upon exertion: x   Short of breath when lying flat:  Irregular heart rhythm: x       Vascular    Pain in calf, thigh, or hip brought on by ambulation: x   Pain in feet at night that wakes you up from your sleep:     Blood clot in your veins:    Leg swelling:  x       Pulmonary    Oxygen at home:    Productive cough:     Wheezing:         Neurologic    Sudden weakness in arms or legs:     Sudden numbness in arms or legs:     Sudden onset of difficulty speaking or slurred speech:    Temporary loss of vision in one eye:     Problems with dizziness:         Gastrointestinal    Blood in stool:      Vomited blood:         Genitourinary    Burning when urinating:     Blood in urine:        Psychiatric    Major depression:         Hematologic    Bleeding problems: x   Problems with blood clotting too easily: x       Skin    Rashes or ulcers:        Constitutional    Fever or chills:     PHYSICAL EXAM:       Vitals:   02/16/17 1232 02/16/17 1238  BP: 138/61 127/62  Pulse: 69   Resp: 20   Temp: 97.9 F (36.6 C)   TempSrc: Oral   SpO2: 99%   Weight: 218 lb (98.9 kg)   Height: 5\' 7"  (1.702 m)     GENERAL: The patient is a well-nourished male, in no acute distress. The vital signs are documented above. CARDIAC: There is a regular rate and rhythm.  VASCULAR: Palpable left brachial and radial pulse PULMONARY: Nonlabored respirations  MUSCULOSKELETAL: There are no major deformities or cyanosis. NEUROLOGIC: No focal weakness or paresthesias are detected. SKIN: There are no ulcers or rashes noted. PSYCHIATRIC: The patient has a normal affect.  STUDIES:   I have reviewed vein mapping studies.  This shows an  adequate cephalic vein beginning at the antecubital fossa.  ASSESSMENT and PLAN   Chronic renal insufficiency: The patient will be scheduled for a left brachiocephalic fistula.  I discussed the risks and benefits of the procedure with the patient and his son including the risk of steal syndrome and non-maturity.  We also discussed need for future interventions.  Because of the patient's mechanical valve, he will need to have Dr. [name] his cardiologist take recommendations regarding transition to coming off of Coumadin for surgery.  Once this is been determined, the patient will be scheduled for surgery.   Annamarie Major, MD [facility] [phone] Pager [phone]

## 2017-03-04 NOTE — Anesthesia Procedure Notes (Signed)
Procedure Name: MAC Date/Time: 03/04/2017 7:42 AM Performed by: Scheryl Darter Pre-anesthesia Checklist: Patient identified, Emergency Drugs available, Suction available, Patient being monitored and Timeout performed Patient Re-evaluated:Patient Re-evaluated prior to inductionOxygen Delivery Method: Simple face mask Placement Confirmation: positive ETCO2

## 2017-03-04 NOTE — Transfer of Care (Signed)
Immediate Anesthesia Transfer of Care Note  Patient: Robert Stanton  Procedure(s) Performed: Procedure(s): ARTERIOVENOUS (AV) FISTULA CREATION LEFT ARM (Left)  Patient Location: PACU  Anesthesia Type:MAC  Level of Consciousness: awake, alert , oriented and sedated  Airway & Oxygen Therapy: Patient Spontanous Breathing and Patient connected to face mask oxygen  Post-op Assessment: Report given to RN, Post -op Vital signs reviewed and stable and Patient moving all extremities  Post vital signs: Reviewed and stable  Last Vitals:  Vitals:   03/04/17 0653 03/04/17 0903  BP: (!) 129/48 (!) (P) 120/55  Pulse: (!) 59 (P) 61  Resp: 20 (P) 15  Temp: 36.8 C 36.6 C    Last Pain:  Vitals:   03/04/17 0903  PainSc: (P) 0-No pain      Patients Stated Pain Goal: 3 (21/30/86 5784)  Complications: No apparent anesthesia complications

## 2017-03-05 ENCOUNTER — Encounter (HOSPITAL_COMMUNITY): Payer: Self-pay | Admitting: Surgery

## 2017-03-07 ENCOUNTER — Other Ambulatory Visit: Payer: PPO

## 2017-03-07 DIAGNOSIS — Z720 Tobacco use: Secondary | ICD-10-CM | POA: Diagnosis not present

## 2017-03-07 DIAGNOSIS — D631 Anemia in chronic kidney disease: Secondary | ICD-10-CM | POA: Diagnosis not present

## 2017-03-07 DIAGNOSIS — Z7901 Long term (current) use of anticoagulants: Secondary | ICD-10-CM

## 2017-03-07 DIAGNOSIS — I129 Hypertensive chronic kidney disease with stage 1 through stage 4 chronic kidney disease, or unspecified chronic kidney disease: Secondary | ICD-10-CM | POA: Diagnosis not present

## 2017-03-07 DIAGNOSIS — N2581 Secondary hyperparathyroidism of renal origin: Secondary | ICD-10-CM | POA: Diagnosis not present

## 2017-03-07 DIAGNOSIS — N184 Chronic kidney disease, stage 4 (severe): Secondary | ICD-10-CM | POA: Diagnosis not present

## 2017-03-07 DIAGNOSIS — Z6835 Body mass index (BMI) 35.0-35.9, adult: Secondary | ICD-10-CM | POA: Diagnosis not present

## 2017-03-08 ENCOUNTER — Other Ambulatory Visit: Payer: Self-pay | Admitting: Internal Medicine

## 2017-03-08 ENCOUNTER — Telehealth: Payer: Self-pay | Admitting: Surgery

## 2017-03-08 DIAGNOSIS — Z952 Presence of prosthetic heart valve: Secondary | ICD-10-CM

## 2017-03-08 DIAGNOSIS — I10 Essential (primary) hypertension: Secondary | ICD-10-CM

## 2017-03-08 DIAGNOSIS — Z7901 Long term (current) use of anticoagulants: Secondary | ICD-10-CM

## 2017-03-08 DIAGNOSIS — Z79899 Other long term (current) drug therapy: Secondary | ICD-10-CM

## 2017-03-08 DIAGNOSIS — N185 Chronic kidney disease, stage 5: Secondary | ICD-10-CM

## 2017-03-08 LAB — PROTIME-INR
INR: 1.4 — AB
PROTHROMBIN TIME: 15 s — AB (ref 9.0–11.5)

## 2017-03-08 NOTE — Telephone Encounter (Signed)
Sched appt 04/11/17; lab at 11:30 and MD at 12:15. Lm on hm# for pt to confirm appt.

## 2017-03-08 NOTE — Telephone Encounter (Signed)
-----   Message from Mena Goes, RN sent at 03/04/2017  3:45 PM EDT ----- Regarding: 4-6 weeks w/ duplex   ----- Message ----- From: Ansel Bong Sent: 03/04/2017   8:46 AM To: [facility] Charge Pool  S/p left brachial cephalic AV fistula 4/38/88  F/u with NP or [facility] in 4-6 weeks with duplex  Thanks Maudie Mercury

## 2017-03-10 ENCOUNTER — Encounter: Payer: Self-pay | Admitting: Internal Medicine

## 2017-03-11 ENCOUNTER — Other Ambulatory Visit: Payer: PPO

## 2017-03-11 DIAGNOSIS — Z7901 Long term (current) use of anticoagulants: Secondary | ICD-10-CM | POA: Diagnosis not present

## 2017-03-11 DIAGNOSIS — Z952 Presence of prosthetic heart valve: Secondary | ICD-10-CM

## 2017-03-11 DIAGNOSIS — I10 Essential (primary) hypertension: Secondary | ICD-10-CM

## 2017-03-11 DIAGNOSIS — Z79899 Other long term (current) drug therapy: Secondary | ICD-10-CM | POA: Diagnosis not present

## 2017-03-11 DIAGNOSIS — N185 Chronic kidney disease, stage 5: Secondary | ICD-10-CM

## 2017-03-11 LAB — BASIC METABOLIC PANEL WITH GFR
BUN: 61 mg/dL — AB (ref 7–25)
CALCIUM: 9.1 mg/dL (ref 8.6–10.3)
CO2: 28 mmol/L (ref 20–31)
CREATININE: 3.42 mg/dL — AB (ref 0.70–1.18)
Chloride: 101 mmol/L (ref 98–110)
GFR, Est African American: 19 mL/min — ABNORMAL LOW (ref >=60)
GFR, Est Non African American: 17 mL/min — ABNORMAL LOW (ref >=60)
GLUCOSE: 98 mg/dL (ref 65–99)
Potassium: 4.2 mmol/L (ref 3.5–5.3)
Sodium: 140 mmol/L (ref 135–146)

## 2017-03-11 LAB — PROTIME-INR
INR: 1.8 — ABNORMAL HIGH
PROTHROMBIN TIME: 18.5 s — AB (ref 9.0–11.5)

## 2017-03-11 LAB — CBC WITH DIFFERENTIAL/PLATELET
BASOS ABS: 0 {cells}/uL (ref 0–200)
Basophils Relative: 0 %
Eosinophils Absolute: 138 cells/uL (ref 15–500)
Eosinophils Relative: 3 %
HEMATOCRIT: 30 % — AB (ref 38.5–50.0)
Hemoglobin: 9.2 g/dL — ABNORMAL LOW (ref 13.2–17.1)
LYMPHS PCT: 17 %
Lymphs Abs: 782 cells/uL — ABNORMAL LOW (ref 850–3900)
MCH: 30.6 pg (ref 27.0–33.0)
MCHC: 30.7 g/dL — AB (ref 32.0–36.0)
MCV: 99.7 fL (ref 80.0–100.0)
MONO ABS: 460 {cells}/uL (ref 200–950)
MONOS PCT: 10 %
MPV: 10 fL (ref 7.5–12.5)
NEUTROS PCT: 70 %
Neutro Abs: 3220 cells/uL (ref 1500–7800)
PLATELETS: 206 10*3/uL (ref 140–400)
RBC: 3.01 MIL/uL — ABNORMAL LOW (ref 4.20–5.80)
RDW: 15.9 % — AB (ref 11.0–15.0)
WBC: 4.6 10*3/uL (ref 3.8–10.8)

## 2017-03-14 ENCOUNTER — Encounter: Payer: Self-pay | Admitting: Internal Medicine

## 2017-03-14 NOTE — Patient Instructions (Signed)
Bleeding Precautions When on Anticoagulant Therapy  WHAT IS ANTICOAGULANT THERAPY?  Anticoagulant therapy is taking medicine to prevent or reduce blood clots. It is also called blood thinner therapy. Blood clots that form in your blood vessels can be dangerous. They can break loose and travel to your heart, lungs, or brain. This increases your risk of a heart attack or stroke. Anticoagulant therapy causes blood to clot more slowly. You may need anticoagulant therapy if you have:  A medical condition that increases the likelihood that blood clots will form.  A heart defect or a problem with heart rhythm. It is also a common treatment after heart surgery, such as valve replacement.  WHAT ARE COMMON TYPES OF ANTICOAGULANT THERAPY?  Anticoagulant medicine can be injected or taken by mouth.If you need anticoagulant therapy quickly at the hospital, the medicine may be injected under your skin or given through an IV tube. Heparin is a common example of an anticoagulant that you may get at the hospital. Most anticoagulant therapy is in the form of pills that you take at home every day. These may include:  Aspirin. This common blood thinner works by preventing blood cells (platelets) from sticking together to form a clot. Aspirin is not as strong as anticoagulants that slow down the time that it takes for your body to form a clot.  Clopidogrel. This is a newer type of drug that affects platelets. It is stronger than aspirin.  Warfarin. This is the most common anticoagulant. It changes the way your body uses vitamin K, a vitamin that helps your blood to clot. The risk of bleeding is higher with warfarin than with aspirin. You will need frequent blood tests to make sure you are taking the safest amount.  New anticoagulants. Several new drugs have been approved. They are all taken by mouth. Studies show that these drugs work as well as warfarin. They do not require blood testing. They may cause less  bleeding risk than warfarin.   WHAT DO I NEED TO REMEMBER WHEN TAKING ANTICOAGULANT THERAPY?  Anticoagulant therapy decreases your risk of forming a blood clot, but it increases your risk of bleeding. Work closely with your health care provider to make sure you are taking your medicine safely. These tips can help:  Learn ways to reduce your risk of bleeding.  If you are taking warfarin: ? Have blood tests as ordered by your health care provider. ? Do not make any sudden changes to your diet. Vitamin K in your diet can make warfarin less effective. ? Do not get pregnant. This medicine may cause birth defects.  Take your medicine at the same time every day. If you forget to take your medicine, take it as soon as you remember. If you miss a whole day, do not double your dose of medicine. Take your normal dose and call your health care provider to check in.  Do not stop taking your medicine on your own.  Tell your health care provider before you start taking any new medicine, vitamin, or herbal product. Some of these could interfere with your therapy.  Tell all of your health care providers that you are on anticoagulant therapy.  Do not have surgery, medical procedures, or dental work until you tell your health care provider that you are on anticoagulant therapy.   WHAT CAN AFFECT HOW ANTICOAGULANTS WORK? Certain foods, vitamins, medicines, supplements, and herbal medicines change the way that anticoagulant therapy works. They may increase or decrease the effects of your anticoagulant   therapy. Either result can be dangerous for you.  Many over-the-counter medicines for pain, colds, or stomach problems interfere with anticoagulant therapy. Take these only as told by your health care provider.  Do not drink alcohol. It can interfere with your medicine and increase your risk of an injury that causes bleeding.  If you are taking warfarin, do not begin eating more foods that contain vitamin K.  These include leafy green vegetables. Ask your health care provider if you should avoid any foods.   WHAT ARE SOME WAYS TO PREVENT BLEEDING?  You can prevent bleeding by taking certain precautions:  Be extra careful when you use knives, scissors, or other sharp objects.  Use an electric razor instead of a blade.  Do not use toothpicks.  Use a soft toothbrush.  Wear shoes that have nonskid soles.  Use bath mats and handrails in your bathroom.  Wear gloves while you do yard work.  Wear a helmet when you ride a bike.  Wear your seat belt.  Prevent falls by removing loose rugs and extension cords from areas where you walk.  Do not play contact sports or participate in other activities that have a high risk of injury.   WHEN SHOULD I CONTACT MY HEALTH CARE PROVIDER?  Call your health care provider if:  You miss a dose of medicine: ? And you are not sure what to do. ? For more than one day.  You have: ? Menstrual bleeding that is heavier than normal. ? Blood in your urine. ? A bloody nose or bleeding gums. ? Easy bruising. ? Blood in your stool (feces) or have black and tarry stool. ? Side effects from your medicine.  You feel weak or dizzy.  You become pregnant. Seek immediate medical care if:  You have bleeding that will not stop.  You have sudden and severe headache or belly pain.  You vomit or you cough up bright red blood.  You have a severe blow to your head.   WHAT ARE SOME QUESTIONS TO ASK MY HEALTH CARE PROVIDER?   What is the best anticoagulant therapy for my condition?  What side effects should I watch for?  When should I take my medicine? What should I do if I forget to take it?  Will I need to have regular blood tests?  Do I need to change my diet? Are there foods or drinks that I should avoid?  What activities are safe for me?  What should I do if I want to get pregnant?   +++++++++++++++++++++++++ Recommend Adult Low Dose Aspirin  or  coated  Aspirin 81 mg daily  To reduce risk of Colon Cancer 20 %,  Skin Cancer 26 % ,  Melanoma 46%  and  Pancreatic cancer 60% +++++++++++++++++++++++++ Vitamin D goal  is between 70-100.  Please make sure that you are taking your Vitamin D as directed.  It is very important as a natural anti-inflammatory  helping hair, skin, and nails, as well as reducing stroke and heart attack risk.  It helps your bones and helps with mood. It also decreases numerous cancer risks so please take it as directed.  Low Vit D is associated with a 200-300% higher risk for CANCER  and 200-300% higher risk for HEART   ATTACK  &  STROKE.   ...................................... It is also associated with higher death rate at younger ages,  autoimmune diseases like Rheumatoid arthritis, Lupus, Multiple Sclerosis.    Also many other serious conditions, like   depression, Alzheimer's Dementia, infertility, muscle aches, fatigue, fibromyalgia - just to name a few. ++++++++++++++++++++ Recommend the book "The END of DIETING" by Dr Joel Fuhrman  & the book "The END of DIABETES " by Dr Joel Fuhrman At [employer] - get book & Audio CD's    Being diabetic has a  300% increased risk for heart attack, stroke, cancer, and alzheimer- type vascular dementia. It is very important that you work harder with diet by avoiding all foods that are white. Avoid white rice (brown & wild rice is OK), white potatoes (sweetpotatoes in moderation is OK), White bread or wheat bread or anything made out of white flour like bagels, donuts, rolls, buns, biscuits, cakes, pastries, cookies, pizza crust, and pasta (made from white flour & egg whites) - vegetarian pasta or spinach or wheat pasta is OK. Multigrain breads like Arnold's or Pepperidge Farm, or multigrain sandwich thins or flatbreads.  Diet, exercise and weight loss can reverse and cure diabetes in the early stages.  Diet, exercise and weight loss is very important in the control and  prevention of complications of diabetes which affects every system in your body, ie. Brain - dementia/stroke, eyes - glaucoma/blindness, heart - heart attack/heart failure, kidneys - dialysis, stomach - gastric paralysis, intestines - malabsorption, nerves - severe painful neuritis, circulation - gangrene & loss of a leg(s), and finally cancer and Alzheimers.    I recommend avoid fried & greasy foods,  sweets/candy, white rice (brown or wild rice or Quinoa is OK), white potatoes (sweet potatoes are OK) - anything made from white flour - bagels, doughnuts, rolls, buns, biscuits,white and wheat breads, pizza crust and traditional pasta made of white flour & egg white(vegetarian pasta or spinach or wheat pasta is OK).  Multi-grain bread is OK - like multi-grain flat bread or sandwich thins. Avoid alcohol in excess. Exercise is also important.    Eat all the vegetables you want - avoid meat, especially red meat and dairy - especially cheese.  Cheese is the most concentrated form of trans-fats which is the worst thing to clog up our arteries. Veggie cheese is OK which can be found in the fresh produce section at [employer] or [employer] or [employer]  +++++++++++++++++++++ DASH Eating Plan  DASH stands for "Dietary Approaches to Stop Hypertension."   The DASH eating plan is a healthy eating plan that has been shown to reduce high blood pressure (hypertension). Additional health benefits may include reducing the risk of type 2 diabetes mellitus, heart disease, and stroke. The DASH eating plan may also help with weight loss. WHAT DO I NEED TO KNOW ABOUT THE DASH EATING PLAN? For the DASH eating plan, you will follow these general guidelines:  Choose foods with a percent daily value for sodium of less than 5% (as listed on the food label).  Use salt-free seasonings or herbs instead of table salt or sea salt.  Check with your health care provider or pharmacist before using salt substitutes.  Eat  lower-sodium products, often labeled as "lower sodium" or "no salt added."  Eat fresh foods.  Eat more vegetables, fruits, and low-fat dairy products.  Choose whole grains. Look for the word "whole" as the first word in the ingredient list.  Choose fish   Limit sweets, desserts, sugars, and sugary drinks.  Choose heart-healthy fats.  Eat veggie cheese   Eat more home-cooked food and less restaurant, buffet, and fast food.  Limit fried foods.  Cook foods using methods other than frying.    Limit canned vegetables. If you do use them, rinse them well to decrease the sodium.  When eating at a restaurant, ask that your food be prepared with less salt, or no salt if possible.                      WHAT FOODS CAN I EAT? Read Dr Joel Fuhrman's books on The End of Dieting & The End of Diabetes  Grains Whole grain or whole wheat bread. Brown rice. Whole grain or whole wheat pasta. Quinoa, bulgur, and whole grain cereals. Low-sodium cereals. Corn or whole wheat flour tortillas. Whole grain cornbread. Whole grain crackers. Low-sodium crackers.  Vegetables Fresh or frozen vegetables (raw, steamed, roasted, or grilled). Low-sodium or reduced-sodium tomato and vegetable juices. Low-sodium or reduced-sodium tomato sauce and paste. Low-sodium or reduced-sodium canned vegetables.   Fruits All fresh, canned (in natural juice), or frozen fruits.  Protein Products  All fish and seafood.  Dried beans, peas, or lentils. Unsalted nuts and seeds. Unsalted canned beans.  Dairy Low-fat dairy products, such as skim or 1% milk, 2% or reduced-fat cheeses, low-fat ricotta or cottage cheese, or plain low-fat yogurt. Low-sodium or reduced-sodium cheeses.  Fats and Oils Tub margarines without trans fats. Light or reduced-fat mayonnaise and salad dressings (reduced sodium). Avocado. Safflower, olive, or canola oils. Natural peanut or almond butter.  Other Unsalted popcorn and pretzels. The items listed  above may not be a complete list of recommended foods or beverages. Contact your dietitian for more options.  +++++++++++++++  WHAT FOODS ARE NOT RECOMMENDED? Grains/ White flour or wheat flour White bread. White pasta. White rice. Refined cornbread. Bagels and croissants. Crackers that contain trans fat.  Vegetables  Creamed or fried vegetables. Vegetables in a . Regular canned vegetables. Regular canned tomato sauce and paste. Regular tomato and vegetable juices.  Fruits Dried fruits. Canned fruit in light or heavy syrup. Fruit juice.  Meat and Other Protein Products Meat in general - RED meat & White meat.  Fatty cuts of meat. Ribs, chicken wings, all processed meats as bacon, sausage, bologna, salami, fatback, hot dogs, bratwurst and packaged luncheon meats.  Dairy Whole or 2% milk, cream, half-and-half, and cream cheese. Whole-fat or sweetened yogurt. Full-fat cheeses or blue cheese. Non-dairy creamers and whipped toppings. Processed cheese, cheese spreads, or cheese curds.  Condiments Onion and garlic salt, seasoned salt, table salt, and sea salt. Canned and packaged gravies. Worcestershire sauce. Tartar sauce. Barbecue sauce. Teriyaki sauce. Soy sauce, including reduced sodium. Steak sauce. Fish sauce. Oyster sauce. Cocktail sauce. Horseradish. Ketchup and mustard. Meat flavorings and tenderizers. Bouillon cubes. Hot sauce. Tabasco sauce. Marinades. Taco seasonings. Relishes.  Fats and Oils Butter, stick margarine, lard, shortening and bacon fat. Coconut, palm kernel, or palm oils. Regular salad dressings.  Pickles and olives. Salted popcorn and pretzels.  The items listed above may not be a complete list of foods and beverages to avoid.   

## 2017-03-14 NOTE — Progress Notes (Signed)
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      Patient recently had a formulary tier change of his Bumex 2 mg to Lasix 40 mg and since has had a 15-16 # weight gain and worsening dependent edema. Also, he reports noticing slightly increased dyspnea and denies any CP/PND or orthopnea.      Patient is treated for HTN (1980)  & BP has been controlled at home. Today's BP is 112/56. In 2000, he underwent CABG & St Jude AoVR. Also in 200 he underwent failed RFA for cAfib.  and continues on Coumadin also for his prosthetic Ht valve.  As above he also has ASCVD w/hx/o TIA and also ASPVD s/d AoBiFem BPG & Rt BiFem PCA. In add'n, he has Stents for Renal Aa Stenosis by Dr Gwenlyn Found. More recently in the last 2 weeks, with impending ESRD/Dialysis, he had creation of a LUE Brachiocephalic AVF on 0/16/01 by Dr. Trula Slade. Patient denies any complaints of any cardiac type chest pain, palpitations, dyspnea/orthopnea/PND, dizziness, claudication, or dependent edema.     Hyperlipidemia is not controlled with diet & Pravastatin. Patient denies myalgias or other med SE's. 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  He denies any symptoms of reactive hypoglycemia, diabetic polys, paresthesias or visual blurring.  Last A1c was near goal: Lab Results  Component Value Date   HGBA1C 5.7 (H) 12/13/2016      Patient has been compensated on Thyroid replacement since 2015. His Gout seem adequately controlled w/Allopurinol. Further, the patient also has  history of Vitamin D Deficiency and supplements vitamin D ("22" in 2010) without any suspected side-effects. Last vitamin D was at goal: Lab Results  Component Value Date   VD25OH 79 12/13/2016   Current Outpatient Prescriptions on File Prior to Visit  Medication Sig  . albuterol  MG/3ML neb soln Take 3 mLs (2.5 mg total) by neb every 4 (four) hours as needed   . allopurinol  300 MG tablet TAKE 1 TABLET BY MOUTH DAILY. TO PREVENT GOUT  . amiodarone  200 MG tablet TAKE 1 TABLET (200 MG TOTAL) BY MOUTH DAILY.  [name] aspirin EC 81 MG tablet Take 81 mg by mouth daily.  [name] VITAMIN D 5000 units  Take 10,000 Units by mouth daily.  [name] LOTRISONE cream Apply 1 application topically daily as needed   . fenofibrate 145 MG tab TAKE 1 TABLET DAILY FOR BLOOD FATS  . IMDUR 30 MG 24 hr tablet TAKE 1 TABLET BY MOUTH EVERY DAY  . levothyroxine  50 MCG tablet TAKE 1 TABLET BY MOUTH EVERY DAY  . Montelukast 10 MG tablet TAKE 1 TABLET (10 MG TOTAL) BY MOUTH DAILY.  [name] NITROSTAT 0.4 MG  AS NEEDED FOR CHEST PAIN.  [name] pravastatin  40 MG tablet TAKE 1 TABLET (40 MG TOTAL) BY MOUTH EVERY MORNING.  [name] sertraline  50 MG tablet TAKE 1 TABLET (50 MG TOTAL) BY MOUTH DAILY.  . verapamil  80 MG tablet  TAKE 1 TABLET (80 MG TOTAL) BY MOUTH 2 (TWO) TIMES DAILY.  . vitamin B-12 1000 MCG tab Take 1,000 mcg by mouth daily.  . Warfarin 5 MG tablet Take 1 tablet daily or as directed by your doctor.   Allergies  Allergen Reactions  . Chantix [Varenicline] Nausea And Vomiting  . Lyrica [Pregabalin] Other (See Comments)    Swelling, couldn't breathe  . Nsaids Other (See Comments)    Cannot take while on coumadin  . Simvastatin Other (See Comments)    Pt said he had a "bleed out" after taking it  . Ziac [Bisoprolol-Hydrochlorothiazide] Other (See Comments)    fatigue   PMHx:   Past Medical History:  Diagnosis Date  . AAA (abdominal aortic aneurysm) ([place])   . Arthritis   . CAD (coronary artery disease)    a. CABG '00. b. all SVGs  occluded 2010. c. low risk Nuc 2012   . Cataracts, bilateral   . Chronic anticoagulation   . CKD (chronic kidney disease), stage III   . COPD (chronic obstructive pulmonary disease) ([place])   . GERD (gastroesophageal reflux disease)   . Gout   . Heart murmur   . Hiatal hernia   . History of Doppler ultrasound 04/05/2013   LEAs; small distal abd. aoritc aneuysm is stable; fem-pop graft to R leg has 50-69% blockage  . History of echocardiogram 04/17/2013   EF 40-45%; LV hypertrophy; LV mild-mod dilated; AV regurg.; LA severely diated, RA mildly dilated  . History of nuclear stress test 05/14/2011   Dipyridamole; moderate perfusion defect in Basal Infrioer & Mid Inferior region - consistent w/infarct or scar; global LV systolic function mildly reduced; EKG negative for ischemia; low risk scan  . HOH (hard of hearing)   . Hyperlipidemia   . Hypertension   . Hypothyroidism   . Ischemic cardiomyopathy   . Kidney cysts   . Mechanical heart valve present    a. Hx mechanical St Jude AVR 2000.  [name] OSA (obstructive sleep apnea)    wears CPAP  . Peripheral arterial disease ([place])    a. s/p aorto bifem BG;  b. 11/2013 PVA distal R Fem-fem stenosis;  c. 12/2013 PTA of dist R femoral bypass graft.  . Permanent atrial fibrillation ([place])   . Prediabetes   . Ventricular tachycardia (paroxysmal) ([place])    Seen by Dr. Lovena Le with EP, started on amiodarone  . Vitamin B deficiency    Immunization History  Administered Date(s) Administered  . DT 01/25/2012  . Influenza Split 06/28/2013  . Influenza, High Dose Seasonal PF 08/02/2014, 07/17/2015, 07/05/2016  . Pneumococcal Conjugate-13 03/25/2014  . Pneumococcal Polysaccharide-23 08/11/2012  . Zoster 10/30/2010   Past Surgical History:  Procedure Laterality Date  . AORTIC VALVE REPLACEMENT     St. Jude  . AV FISTULA PLACEMENT Left 03/04/2017   Procedure: ARTERIOVENOUS (AV) FISTULA CREATION LEFT ARM;  Surgeon: Serafina Mitchell, MD;  Location: [facility];   Service: Vascular;  Laterality: Left;  . CARDIAC CATHETERIZATION  11/25/2008   loss of 2/3 (RCA, OM) bypass grafts with patent internal mammary artery to LAD; large collateral filling of RCA ; osteal narrowing of circumflex of ~50%  . CARDIOVERSION  8/282012  . CORONARY ARTERY BYPASS GRAFT  2000   3 vessel; LIMA to LAD; RCA, OM  . FEMORAL BYPASS    . LOWER EXTREMITY ANGIOGRAM N/A 12/03/2013   Procedure: LOWER EXTREMITY ANGIOGRAM;  Surgeon: Lorretta Harp, MD;  Location[facility] CATH LAB;  Service: Cardiovascular;  Laterality: N/A;  . LOWER EXTREMITY ANGIOGRAM N/A 01/07/2014   Procedure: LOWER EXTREMITY ANGIOGRAM;  Surgeon: Lorretta Harp, MD;  Location: [facility] CATH LAB;  Service: Cardiovascular;  Laterality: N/A;  . shunts in femoral arteries    . stents in kidneys     FHx:    Reviewed / unchanged  SHx:    Reviewed / unchanged  Systems Review:  Constitutional: Denies fever, chills, wt changes, headaches, insomnia, fatigue, night sweats, change in appetite. Eyes: Denies redness, blurred vision, diplopia, discharge, itchy, watery eyes.  ENT: Denies discharge, congestion, post nasal drip, epistaxis, sore throat, earache, hearing loss, dental pain, tinnitus, vertigo, sinus pain, snoring.  CV: Denies chest pain, palpitations, irregular heartbeat, syncope, dyspnea, diaphoresis, orthopnea, PND, claudication or edema. Respiratory: denies cough, dyspnea, DOE, pleurisy, hoarseness, laryngitis, wheezing.  Gastrointestinal: Denies dysphagia, odynophagia, heartburn, reflux, water brash, abdominal pain or cramps, nausea, vomiting, bloating, diarrhea, constipation, hematemesis, melena, hematochezia  or hemorrhoids. Genitourinary: Denies dysuria, frequency, urgency, nocturia, hesitancy, discharge, hematuria or flank pain. Musculoskeletal: Denies arthralgias, myalgias, stiffness, jt. swelling, pain, limping or strain/sprain.  Skin: Denies pruritus, rash, hives, warts, acne, eczema or change in skin  lesion(s). Neuro: No weakness, tremor, incoordination, spasms, paresthesia or pain. Psychiatric: Denies confusion, memory loss or sensory loss. Endo: Denies change in weight, skin or hair change.  Heme/Lymph: No excessive bleeding, bruising or enlarged lymph nodes.  Physical Exam  BP (!) 116/56   Pulse 72   Temp 97.6 F (36.4 C)   Resp 18   Ht 5\' 7"  (1.702 m)   Wt 238 lb 6.4 oz (108.1 kg)   SpO2 98%   BMI 37.34 kg/m   Appears overweight elderly man in no distress.  Eyes: PERRLA, EOMs, conjunctiva no swelling or erythema. Sinuses: No frontal/maxillary tenderness ENT/Mouth: EAC's clear, TM's nl w/o erythema, bulging. Nares clear w/o erythema, swelling, exudates. Oropharynx clear without erythema or exudates. Oral hygiene is good. Tongue normal, non obstructing. Hearing intact.  Neck: Supple. Thyroid nl. Car 2+/2+ without bruits, nodes and  JV difficult to distinguish. Chest: Respirations nl with BS clear & equal w/o rales, rhonchi, wheezing or stridor.  Cor: Heart sounds soft with prosthetic valve clicks  w/ reg rate and rhythm without sig. murmurs, gallops, clicks or rubs. Thrill & bruit detected over Rt Brachiocephalic AVF.  Pedal pulses obscured with 3 + high pretibial pitting edema.  Abdomen: Soft & bowel sounds normal. Non-tender w/o guarding, rebound, hernias, masses or organomegaly.  Lymphatics: Unremarkable.  Musculoskeletal: Full ROM all peripheral extremities, joint stability, 5/5 strength and normal gait.  Skin: Warm, dry without exposed rashes, lesions or ecchymosis apparent.  Neuro: Cranial nerves intact, reflexes equal bilaterally. Sensory-motor testing grossly intact. Tendon reflexes grossly intact.  Pysch: Alert & oriented x 3.  Insight and judgement nl & appropriate. No ideations.  Assessment and Plan:  1. Essential hypertension  - Continue medication, monitor blood pressure at home.  - Continue DASH diet. Reminder to go to the ER if any CP,  SOB, nausea,  dizziness, severe HA, changes vision/speech,  left arm numbness and tingling and jaw pain.  - Magnesium - TSH  2. Hyperlipidemia, mixed  - Continue diet/meds, exercise,& lifestyle modifications.  - Continue monitor periodic cholesterol/liver & renal functions   - Lipid panel - TSH  3. T2_NIDDM /CKD 4/5 not on chronic dialysis ([place])  - Hemoglobin A1c - Insulin, random  4. Vitamin D deficiency  - Continue diet, exercise, lifestyle modifications.  - Monitor appropriate labs. - Continue supplementation. -  VITAMIN D 25 Hydroxy  5. Permanent atrial fibrillation (HCC)  - Protime-INR  6. Idiopathic gout  - Uric acid  7. Gastroesophageal reflux disease   8. Hypothyroidism  - TSH  9. Long term current use of anticoagulant therapy  - Protime-INR  10. Medication management  - Magnesium - Lipid panel - TSH - Hemoglobin A1c - Insulin, random - VITAMIN D 25 Hydroxy - Uric acid     11. Suspect subacute diastolic volume overload consequent of diuretic change.   - increase lasix to 80 mg bid and monitor daily weights & OV 10 days to re-assess.    Discussed  regular exercise, BP monitoring, weight control to achieve/maintain BMI less than 25 and discussed med and SE's. Recommended labs to assess and monitor clinical status with further disposition pending results of labs. Over 30 minutes of exam, counseling, chart review was performed.

## 2017-03-15 ENCOUNTER — Ambulatory Visit (INDEPENDENT_AMBULATORY_CARE_PROVIDER_SITE_OTHER): Payer: PPO | Admitting: Internal Medicine

## 2017-03-15 ENCOUNTER — Ambulatory Visit: Payer: Self-pay | Admitting: Internal Medicine

## 2017-03-15 VITALS — BP 116/56 | HR 72 | Temp 97.6°F | Resp 18 | Ht 67.0 in | Wt 238.4 lb

## 2017-03-15 DIAGNOSIS — Z7901 Long term (current) use of anticoagulants: Secondary | ICD-10-CM

## 2017-03-15 DIAGNOSIS — N185 Chronic kidney disease, stage 5: Secondary | ICD-10-CM

## 2017-03-15 DIAGNOSIS — E559 Vitamin D deficiency, unspecified: Secondary | ICD-10-CM

## 2017-03-15 DIAGNOSIS — K219 Gastro-esophageal reflux disease without esophagitis: Secondary | ICD-10-CM

## 2017-03-15 DIAGNOSIS — M1000 Idiopathic gout, unspecified site: Secondary | ICD-10-CM

## 2017-03-15 DIAGNOSIS — I4821 Permanent atrial fibrillation: Secondary | ICD-10-CM

## 2017-03-15 DIAGNOSIS — E039 Hypothyroidism, unspecified: Secondary | ICD-10-CM

## 2017-03-15 DIAGNOSIS — I482 Chronic atrial fibrillation: Secondary | ICD-10-CM

## 2017-03-15 DIAGNOSIS — I10 Essential (primary) hypertension: Secondary | ICD-10-CM | POA: Diagnosis not present

## 2017-03-15 DIAGNOSIS — Z79899 Other long term (current) drug therapy: Secondary | ICD-10-CM

## 2017-03-15 DIAGNOSIS — E782 Mixed hyperlipidemia: Secondary | ICD-10-CM | POA: Diagnosis not present

## 2017-03-15 DIAGNOSIS — E1122 Type 2 diabetes mellitus with diabetic chronic kidney disease: Secondary | ICD-10-CM | POA: Diagnosis not present

## 2017-03-15 LAB — TSH: TSH: 9.18 m[IU]/L — AB (ref 0.40–4.50)

## 2017-03-15 LAB — LIPID PANEL
Cholesterol: 114 mg/dL (ref <200)
HDL: 27 mg/dL — AB (ref >40)
LDL Cholesterol: 66 mg/dL (ref <100)
Total CHOL/HDL Ratio: 4.2 Ratio (ref <5.0)
Triglycerides: 105 mg/dL (ref <150)
VLDL: 21 mg/dL (ref <30)

## 2017-03-15 MED ORDER — FUROSEMIDE 80 MG PO TABS: ORAL_TABLET | ORAL | 1 refills | Status: DC

## 2017-03-16 LAB — HEMOGLOBIN A1C
HEMOGLOBIN A1C: 5.1 % (ref <5.7)
Mean Plasma Glucose: 100 mg/dL

## 2017-03-16 LAB — PROTIME-INR
INR: 3.1 — ABNORMAL HIGH
Prothrombin Time: 31.1 s — ABNORMAL HIGH (ref 9.0–11.5)

## 2017-03-16 LAB — VITAMIN D 25 HYDROXY (VIT D DEFICIENCY, FRACTURES): Vit D, 25-Hydroxy: 72 ng/mL (ref 30–100)

## 2017-03-16 LAB — MAGNESIUM: Magnesium: 2.3 mg/dL (ref 1.5–2.5)

## 2017-03-16 LAB — INSULIN, RANDOM: INSULIN: 4.4 u[IU]/mL (ref 2.0–19.6)

## 2017-03-16 LAB — URIC ACID: URIC ACID, SERUM: 7.5 mg/dL (ref 4.0–8.0)

## 2017-03-25 ENCOUNTER — Ambulatory Visit: Payer: Self-pay | Admitting: Internal Medicine

## 2017-03-27 NOTE — Progress Notes (Signed)
Subjective:    Patient ID: Robert Stanton, male    DOB: 11-Apr-1941, 76 y.o.   MRN: [id number]  HPI   Patient is a 76 yo MWM with HTN, ASHD/Afib, CABG/StJude AoVR, ASCVD/TIAs, ASPVD, T2_NIDDM/ CKD4/5, COPD/OSA/CPAP and  Gout who returns for 10 day f/u after increasing his Diuretic dose  following a 20# weight gain  (from 5/18 to 03/09/17) attributed to acute on chronic Diastolic Heart Failure and  consequent of  formulary change of his diuretic Bumex to Lasix and now since  03/15/2017 has gained another 7# despite doubling his Lasix dose to 80 mg bid. He does report some DOE, but denies any CP, PND or Orthopnea.   Wt Readings from Last 3 Encounters:  03/28/17 245 lb (111.1 kg)  03/15/17 238 lb 6.4 oz (108.1 kg)  03/04/17 218 lb (98.9 kg)      He has Diabetic CKD 5/ESRD and has had recent LUE AVF created on 03/04/17 in anticipation of impending Dialysis with last BUN/Creat of 61/3.42 and GFR 17 ml/min on 03/15/2017.     Apparent while leaning forward to pick-up his dropped car keys, he fell forward and scrapped his Rt forehead and both knees and bruised his dorsal lt fore arm and hand. Denies and presyncopal sx's. No auras or LOC.  Medication Sig  . albuterol (PROVENTIL) (2.5 MG/3ML) 0.083% nebulizer solution Take 3 mLs (2.5 mg total) by nebulization every 4 (four) hours as needed for wheezing or shortness of breath.  . allopurinol (ZYLOPRIM) 300 MG tablet TAKE 1 TABLET BY MOUTH DAILY. TO PREVENT GOUT  . amiodarone (PACERONE) 200 MG tablet TAKE 1 TABLET (200 MG TOTAL) BY MOUTH DAILY.  [name] aspirin EC 81 MG tablet Take 81 mg by mouth daily.  . Cholecalciferol (VITAMIN D3) 5000 units TABS Take 10,000 Units by mouth daily.  . clotrimazole-betamethasone (LOTRISONE) cream Apply 1 application topically daily as needed (rash/ skin irritation).  . fenofibrate (TRICOR) 145 MG tablet TAKE 1 TABLET DAILY FOR BLOOD FATS  . furosemide (LASIX) 40 MG tablet TAKE 1 TABLET BY MOUTH DAILY FOR BLOOD  PRESSURE,HEART,AND FLUID RETENTION  . furosemide (LASIX) 80 MG tablet Take 1 tablet  1 or 2 x / day as directed for BP, heart and fluid retention  . isosorbide mononitrate (IMDUR) 30 MG 24 hr tablet TAKE 1 TABLET BY MOUTH EVERY DAY  . levothyroxine (SYNTHROID, LEVOTHROID) 50 MCG tablet TAKE 1 TABLET BY MOUTH EVERY DAY  . montelukast (SINGULAIR) 10 MG tablet TAKE 1 TABLET (10 MG TOTAL) BY MOUTH DAILY.  [name] NITROSTAT 0.4 MG SL tablet PLACE 1 TABLET (0.4 MG TOTAL) UNDER THE TONGUE EVERY 5 (FIVE) MINUTES AS NEEDED FOR CHEST PAIN.  [name] pravastatin (PRAVACHOL) 40 MG tablet TAKE 1 TABLET (40 MG TOTAL) BY MOUTH EVERY MORNING.  [name] PRESCRIPTION MEDICATION Inhale into the lungs See admin instructions. Use CPAP whenever sleeping  . sertraline (ZOLOFT) 50 MG tablet TAKE 1 TABLET (50 MG TOTAL) BY MOUTH DAILY.  . verapamil (CALAN) 80 MG tablet TAKE 1 TABLET (80 MG TOTAL) BY MOUTH 2 (TWO) TIMES DAILY.  . vitamin B-12 (CYANOCOBALAMIN) 1000 MCG tablet Take 1,000 mcg by mouth daily.  [name] warfarin (COUMADIN) 5 MG tablet Take 1 tablet daily or as directed by your doctor.   No facility-administered medications prior to visit.    Allergies  Allergen Reactions  . Chantix [Varenicline] Nausea And Vomiting  . Lyrica [Pregabalin] Other (See Comments)    Swelling, couldn't breathe  . Nsaids Other (See Comments)  Cannot take while on coumadin  . Simvastatin Other (See Comments)    Pt said he had a "bleed out" after taking it  . Ziac [Bisoprolol-Hydrochlorothiazide] Other (See Comments)    fatigue   Past Medical History:  Diagnosis Date  . AAA (abdominal aortic aneurysm) ([place])   . Arthritis   . CAD (coronary artery disease)    a. CABG '00. b. all SVGs occluded 2010. c. low risk Nuc 2012   . Cataracts, bilateral   . Chronic anticoagulation   . CKD (chronic kidney disease), stage III   . COPD (chronic obstructive pulmonary disease) ([place])   . GERD (gastroesophageal reflux disease)   . Gout   . Heart murmur   .  Hiatal hernia   . History of Doppler ultrasound 04/05/2013   LEAs; small distal abd. aoritc aneuysm is stable; fem-pop graft to R leg has 50-69% blockage  . History of echocardiogram 04/17/2013   EF 40-45%; LV hypertrophy; LV mild-mod dilated; AV regurg.; LA severely diated, RA mildly dilated  . History of nuclear stress test 05/14/2011   Dipyridamole; moderate perfusion defect in Basal Infrioer & Mid Inferior region - consistent w/infarct or scar; global LV systolic function mildly reduced; EKG negative for ischemia; low risk scan  . HOH (hard of hearing)   . Hyperlipidemia   . Hypertension   . Hypothyroidism   . Ischemic cardiomyopathy   . Kidney cysts   . Mechanical heart valve present    a. Hx mechanical St Jude AVR 2000.  [name] OSA (obstructive sleep apnea)    wears CPAP  . Peripheral arterial disease ([place])    a. s/p aorto bifem BG;  b. 11/2013 PVA distal R Fem-fem stenosis;  c. 12/2013 PTA of dist R femoral bypass graft.  . Permanent atrial fibrillation ([place])   . Prediabetes   . Ventricular tachycardia (paroxysmal) ([place])    Seen by Dr. Lovena Le with EP, started on amiodarone  . Vitamin B deficiency    Review of Systems  10 point systems review negative except as above.    Objective:   Physical Exam  BP (!) 130/56   Pulse 84   Temp 97.5 F (36.4 C)   Resp 16   Ht 5\' 7"  (1.702 m)   Wt 245 lb (111.1 kg)   SpO2 97%   BMI 38.37 kg/m   HEENT - WNL. Neck - supple.  Chest - Distant BS w/o RRW. Diaphragms percuss high.  Cor - Soft HS w/prosthetic valve clicks. RRR w/o sig m and PP obscured by 23-4(+) pretibial, ankle pedal edema. Thrill/Bruit over L AVF.  Abd - Protuberant. MS- FROM w/o deformities.  Gait Nl. Neuro -  Nl w/o focal abnormalities. Skin - Abrasion over Rt forehead & both knees and ecchymoses over dorsal Lt fore arm/hand w/o apparent injury otherwise.    Assessment & Plan:   1. Essential hypertension  - Add Zaroxolyn for worsening edema and check CXR and anticipate  expedient OV w/Dr Posey Pronto.  - CBC with Differential/Platelet - BASIC METABOLIC PANEL WITH GFR  2. Acute on chronic diastolic heart failure ([place])   3. Type 2 diabetes mellitus with stage 5 chronic kidney disease not on chronic dialysis, without long-term current use of insulin ([place])   4. Atrial fibrillation, chronic ([place])  - Protime-INR  5. Long term current use of anticoagulant therapy  - Protime-INR  6. Medication management  - CBC with Differential/Platelet - BASIC METABOLIC PANEL WITH GFR  [id number] minutes of exam, counseling, chart review  and high complex critical decision making was performed

## 2017-03-27 NOTE — Patient Instructions (Signed)
Food Basics for Chronic Kidney Disease When your kidneys are not working well, they cannot remove waste and excess substances from your blood as effectively as they did before. This can lead to a buildup and imbalance of these substances, which can worsen kidney damage and affect how your body functions. Certain foods lead to a buildup of these substances in the body. By changing your diet as recommended by your diet and nutrition specialist (dietitian) or health care provider, you could help prevent further kidney damage and delay or prevent the need for dialysis. What are tips for following this plan? General instructions  Work with your health care provider and dietitian to develop a meal plan that is right for you. Foods you can eat, limit, or avoid will be different for each person depending on the stage of kidney disease and any other existing health conditions.  Talk with your health care provider about whether you should take a vitamin and mineral supplement.  Use standard measuring cups and spoons to measure servings of foods. Use a kitchen scale to measure portions of protein foods.  If directed by your health care provider, avoid drinking too much fluid. Measure and count all liquids, including water, ice, soups, flavored gelatin, and frozen desserts such as popsicles or ice cream. Reading food labels  Check the amount of sodium in foods. Choose foods that have less than 300 milligrams (mg) per serving.  Check the ingredient list for phosphorus or potassium-based additives or preservatives.  Check the amount of saturated and trans fat. Limit or avoid these fats as told by your dietitian. Shopping  Avoid buying foods that are: ? Processed, frozen, or prepackaged. ? Calcium-enriched or fortified.  Do not buy foods that have salt or sodium listed among the first five ingredients.  Do not buy canned vegetables. Cooking  Replace animal proteins, such as meat, fish, eggs, or dairy,  with plant proteins from beans, nuts, and soy. ? Use soy milk instead of cow's milk. ? Add beans or tofu to soups, casseroles, or pasta dishes instead of meat.  Soak vegetables, such as potatoes, before cooking to reduce potassium. To do this: ? Peel and cut into small pieces. ? Soak in warm water for at least 2 hours. For every 1 cup of vegetables, use 10 cups of water. ? Drain and rinse with warm water. ? Boil for at least 5 minutes. Meal planning  Limit the amount of protein from plant and animal sources you eat each day.  Do not add salt to food when cooking or before eating.  Eat meals and snacks at around the same time each day. If you have diabetes:  If you have diabetes (diabetes mellitus) and chronic kidney disease, it is important to keep your blood glucose in the target range recommended by your health care provider. Follow your diabetes management plan. This may include: ? Checking your blood glucose regularly. ? Taking oral medicines, insulin, or both. ? Exercising for at least 30 minutes on 5 or more days each week, or as told by your health care provider. ? Tracking how many servings of carbohydrates you eat at each meal.  You may be given specific guidelines on how much of certain foods and nutrients you may eat, depending on your stage of kidney disease and whether you have high blood pressure (hypertension). Follow your meal plan as told by your dietitian. What nutrients should be limited? The items listed are not a complete list. Talk with your dietitian about  what dietary choices are best for you. Potassium Potassium affects how steadily your heart beats. If too much potassium builds up in your blood, it can cause an irregular heartbeat or even a heart attack. You may need to eat less potassium, depending on your blood potassium levels and the stage of kidney disease. Talk to your dietitian about how much potassium you may have each day. You may need to limit or  avoid foods that are high in potassium, such as:  Milk and soy milk.  Fruits, such as bananas, papaya, apricots, nectarines, melon, prunes, raisins, kiwi, and oranges.  Vegetables, such as potatoes, sweet potatoes, yams, tomatoes, leafy greens, beets, okra, avocado, pumpkin, and winter squash.  White and lima beans.  Phosphorus Phosphorus is a mineral found in your bones. A balance between calcium and phosphorous is needed to build and maintain healthy bones. Too much phosphorus pulls calcium from your bones. This can make your bones weak and more likely to break. Too much phosphorus can also make your skin itch. You may need to eat less phosphorus depending on your blood phosphorus levels and the stage of kidney disease. Talk to your dietitian about how much potassium you may have each day. You may need to take medicine to lower your blood phosphorus levels if diet changes do not help. You may need to limit or avoid foods that are high in phosphorus, such as:  Milk and dairy products.  Dried beans and peas.  Tofu, soy milk, and other soy-based meat replacements.  Colas.  Nuts and peanut butter.  Meat, poultry, and fish.  Bran cereals and oatmeals.  Protein Protein helps you to make and keep muscle. It also helps in the repair of your body's cells and tissues. One of the natural breakdown products of protein is a waste product called urea. When your kidneys are not working properly, they cannot remove wastes, such as urea, like they did before you developed chronic kidney disease. Reducing how much protein you eat can help prevent a buildup of urea in your blood. Depending on your stage of kidney disease, you may need to limit foods that are high in protein. Sources of animal protein include:  Meat (all types).  Fish and seafood.  Poultry.  Eggs.  Dairy.  Other protein foods include:  Beans and legumes.  Nuts and nut butter.  Soy and tofu.  Sodium Sodium, which is  found in salt, helps maintain a healthy balance of fluids in your body. Too much sodium can increase your blood pressure and have a negative effect on the function of your heart and lungs. Too much sodium can also cause your body to retain too much fluid, making your kidneys work harder. Most people should have less than 2,300 milligrams (mg) of sodium each day. If you have hypertension, you may need to limit your sodium to 1,500 mg each day. Talk to your dietitian about how much sodium you may have each day. You may need to limit or avoid foods that are high in sodium, such as:  Salt seasonings.  Soy sauce.  Cured and processed meats.  Salted crackers and snack foods.  Fast food.  Canned soups and most canned foods.  Pickled foods.  Vegetable juice.  Boxed mixes or ready-to-eat boxed meals and side dishes.  Bottled dressings, sauces, and marinades.  Summary  Chronic kidney disease can lead to a buildup and imbalance of waste and excess substances in the body. Certain foods lead to a buildup of  these substances. By adjusting your intake of these foods, you could help prevent more kidney damage and delay or prevent the need for dialysis.  Food adjustments are different for each person with chronic kidney disease. Work with a dietitian to set up nutrient goals and a meal plan that is right for you.  If you have diabetes and chronic kidney disease, it is important to keep your blood glucose in the target range recommended by your health care provider. +++++++++++++++++++++++++++  Dialysis Diet Dialysis is a treatment that you may undergo if you have significant damage to your kidneys. Dialysis replaces some of the work that the kidneys do. One of the jobs that it takes over is removing wastes, salt, and extra water from your blood. This helps to keep the amount of potassium and other nutrients in your blood at healthy levels. When you need dialysis, it is important to pay careful  attention to your diet. Between dialysis sessions, certain nutrients can build up in your blood and cause you to get sick. Vitamins and minerals are an important part of a healthy diet and should not be avoided entirely. However, it is commonly recommended that you limit your intake of potassium, phosphorus, and sodium. It may also be necessary to restrict other nutrients, such as carbohydrates or fat, if you have other health conditions. Your health care provider or dietitian can help you to determine the amount of these nutrients that is right for you. What is my plan? Your dietitian will help you to design a meal plan that is specific to your needs. Generally, meal plans include:  Grains, 6-11 servings per day. One serving is equal to 1 slice of bread or  cup of cooked rice or pasta.  Low-potassium vegetables, 2-3 servings per day. One serving is equal to  cup.  Low-potassium fruits, 2-3 servings per day. One serving is equal to  cup.  Meats and other protein sources, 8-11 oz per day.  Dairy,  cup per day.  Your dietitian will provide you with specific instructions about the amount of fluids you can have each day. What do I need to know about a dialysis diet?  Limit your intake of potassium. Potassium is found in milk, fruits, and vegetables.  Limit your intake of phosphorus. Phosphorus is found in milk, cheese, beans, nuts, and carbonated beverages. Avoid whole-grain and high-fiber foods because they contain high amounts of phosphorus.  Limit your intake of sodium. Foods that are high in sodium include processed and cured meats, ready-made frozen meals, canned vegetables, and salty snack foods. Do not use salt substitutes because they contain potassium.  If you were instructed to restrict your fluid intake, follow your health care provider's specific instructions. You may be told to: ? Write down what you drink and any foods you eat that are made mostly from water, such as gelatin and  soups. ? Drink from small cups to help control how much you drink.  Ask your health care provider if you should regularly take an over-the-counter medicine that binds phosphorus, such as antacid products that contain calcium carbonate.  Take vitamin and mineral supplements only as directed by your health care provider.  Eat high-quality proteins, such as meat, poultry, fish, and eggs. Limit low-quality plant-based proteins, such as nuts and beans.  Cut potatoes into small pieces and boil them in unsalted water before you eat them. This can help to remove some potassium from the potato.  Drain all fluid from cooked vegetables and canned  fruits before eating them. What foods can I eat? Grains White bread. White rice. Cooked cereal. Unsalted popcorn. Tortillas. Pasta. Vegetables Fresh or frozen broccoli, carrots, and green beans. Cabbage. Cauliflower. Celery. Cucumbers. Eggplant. Radishes. Zucchini. Fruits Apples. Fresh or frozen berries. Fresh or canned pears, peaches, and pineapple. Grapes. Plums. Meats and Other Protein Sources Fresh or frozen beef, pork, chicken, and fish. Eggs. Low-sodium canned tuna or salmon. Dairy Cream cheese. Heavy cream. Ricotta cheese. Beverages Apple cider. Cranberry juice. Grape juice. Lemonade. Black coffee. Condiments Herbs. Spices. Jam and jelly. Honey. Sweets and Desserts Sherbet. Cakes. Cookies. Fats and Oils Olive oil, canola oil, and safflower oil. Other Non-dairy creamer. Non-dairy whipped topping. Homemade broth without salt. The items listed above may not be a complete list of recommended foods or beverages. Contact your dietitian for more options. What foods are not recommended? Grains Whole-grain bread. Whole-grain pasta. High-fiber cereal. Vegetables Potatoes. Beets. Tomatoes. Winter squash and pumpkin. Asparagus. Spinach. Parsnips. Fruits Star fruit. Bananas. Oranges. Kiwi. Nectarines. Prunes. Melon. Dried fruit. Avocado. Meats and  Other Protein Sources Canned, smoked, and cured meats. Soil scientist. Sardines. Nuts and seeds. Peanut butter. Beans and legumes. Dairy Milk. Buttermilk. Yogurt. Cheese and cottage cheese. Processed cheese spreads. Beverages Orange juice. Prune juice. Carbonated soft drinks. Condiments Salt. Salt substitutes. Soy sauce. Sweets and Desserts Ice cream. Chocolate. Candied nuts. Fats and Oils Butter. Margarine. Other Ready-made frozen meals. Canned soups. The items listed above may not be a complete list of foods and beverages to avoid. Contact your dietitian for more information.

## 2017-03-28 ENCOUNTER — Ambulatory Visit (INDEPENDENT_AMBULATORY_CARE_PROVIDER_SITE_OTHER): Payer: PPO | Admitting: Internal Medicine

## 2017-03-28 ENCOUNTER — Ambulatory Visit (HOSPITAL_COMMUNITY)
Admission: RE | Admit: 2017-03-28 | Discharge: 2017-03-28 | Disposition: A | Discharge status: Home / Self Care | Payer: PPO | Source: Ambulatory Visit | Attending: Internal Medicine | Admitting: Internal Medicine

## 2017-03-28 ENCOUNTER — Encounter: Payer: Self-pay | Admitting: Internal Medicine

## 2017-03-28 VITALS — BP 130/56 | HR 84 | Temp 97.5°F | Resp 16 | Ht 67.0 in | Wt 245.0 lb

## 2017-03-28 DIAGNOSIS — I10 Essential (primary) hypertension: Secondary | ICD-10-CM

## 2017-03-28 DIAGNOSIS — R0602 Shortness of breath: Secondary | ICD-10-CM | POA: Diagnosis not present

## 2017-03-28 DIAGNOSIS — N185 Chronic kidney disease, stage 5: Secondary | ICD-10-CM

## 2017-03-28 DIAGNOSIS — E1122 Type 2 diabetes mellitus with diabetic chronic kidney disease: Secondary | ICD-10-CM | POA: Diagnosis not present

## 2017-03-28 DIAGNOSIS — I482 Chronic atrial fibrillation, unspecified: Secondary | ICD-10-CM

## 2017-03-28 DIAGNOSIS — I5033 Acute on chronic diastolic (congestive) heart failure: Secondary | ICD-10-CM | POA: Diagnosis not present

## 2017-03-28 DIAGNOSIS — R05 Cough: Secondary | ICD-10-CM | POA: Diagnosis not present

## 2017-03-28 DIAGNOSIS — Z79899 Other long term (current) drug therapy: Secondary | ICD-10-CM

## 2017-03-28 DIAGNOSIS — I517 Cardiomegaly: Secondary | ICD-10-CM | POA: Insufficient documentation

## 2017-03-28 DIAGNOSIS — Z7901 Long term (current) use of anticoagulants: Secondary | ICD-10-CM | POA: Diagnosis not present

## 2017-03-28 DIAGNOSIS — R918 Other nonspecific abnormal finding of lung field: Secondary | ICD-10-CM | POA: Insufficient documentation

## 2017-03-28 DIAGNOSIS — R635 Abnormal weight gain: Secondary | ICD-10-CM | POA: Diagnosis not present

## 2017-03-28 DIAGNOSIS — J90 Pleural effusion, not elsewhere classified: Secondary | ICD-10-CM | POA: Insufficient documentation

## 2017-03-28 LAB — CBC WITH DIFFERENTIAL/PLATELET
Basophils Absolute: 0 cells/uL (ref 0–200)
Basophils Relative: 0 %
EOS ABS: 156 {cells}/uL (ref 15–500)
Eosinophils Relative: 3 %
HEMATOCRIT: 28.9 % — AB (ref 38.5–50.0)
Hemoglobin: 9.3 g/dL — ABNORMAL LOW (ref 13.2–17.1)
LYMPHS ABS: 676 {cells}/uL — AB (ref 850–3900)
Lymphocytes Relative: 13 %
MCH: 31.4 pg (ref 27.0–33.0)
MCHC: 32.2 g/dL (ref 32.0–36.0)
MCV: 97.6 fL (ref 80.0–100.0)
MONO ABS: 468 {cells}/uL (ref 200–950)
MONOS PCT: 9 %
MPV: 9.9 fL (ref 7.5–12.5)
NEUTROS ABS: 3900 {cells}/uL (ref 1500–7800)
Neutrophils Relative %: 75 %
PLATELETS: 174 10*3/uL (ref 140–400)
RBC: 2.96 MIL/uL — ABNORMAL LOW (ref 4.20–5.80)
RDW: 15.7 % — ABNORMAL HIGH (ref 11.0–15.0)
WBC: 5.2 10*3/uL (ref 3.8–10.8)

## 2017-03-28 LAB — BASIC METABOLIC PANEL WITH GFR
BUN: 73 mg/dL — AB (ref 7–25)
CHLORIDE: 96 mmol/L — AB (ref 98–110)
CO2: 25 mmol/L (ref 20–31)
Calcium: 9.3 mg/dL (ref 8.6–10.3)
Creat: 4.79 mg/dL — ABNORMAL HIGH (ref 0.70–1.18)
GFR, Est African American: 13 mL/min — ABNORMAL LOW (ref >=60)
GFR, Est Non African American: 11 mL/min — ABNORMAL LOW (ref >=60)
Glucose, Bld: 91 mg/dL (ref 65–99)
Potassium: 4.3 mmol/L (ref 3.5–5.3)
Sodium: 135 mmol/L (ref 135–146)

## 2017-03-28 MED ORDER — METOLAZONE 5 MG PO TABS: ORAL_TABLET | ORAL | 2 refills | Status: DC

## 2017-03-29 ENCOUNTER — Telehealth: Payer: Self-pay | Admitting: Internal Medicine

## 2017-03-29 LAB — PROTIME-INR
INR: 3.4 — ABNORMAL HIGH
PROTHROMBIN TIME: 34.5 s — AB (ref 9.0–11.5)

## 2017-03-29 NOTE — Telephone Encounter (Signed)
Per Dr Melford Aase, please forward note and labs to Dr Posey Pronto at [facility] to review, remarkable weight gain in last 30 days. Consider office visit to review, with impending diayalisis. Office visit with Dr Posey Pronto, 04-05-17 at [facility].

## 2017-03-30 ENCOUNTER — Encounter: Payer: Self-pay | Admitting: Surgery

## 2017-04-04 ENCOUNTER — Other Ambulatory Visit: Payer: Self-pay

## 2017-04-04 DIAGNOSIS — N185 Chronic kidney disease, stage 5: Secondary | ICD-10-CM

## 2017-04-05 ENCOUNTER — Encounter: Payer: Self-pay | Admitting: Internal Medicine

## 2017-04-05 ENCOUNTER — Ambulatory Visit (INDEPENDENT_AMBULATORY_CARE_PROVIDER_SITE_OTHER): Payer: PPO | Admitting: Internal Medicine

## 2017-04-05 VITALS — BP 118/54 | HR 74 | Temp 97.3°F | Resp 20 | Ht 67.0 in | Wt 243.4 lb

## 2017-04-05 DIAGNOSIS — I10 Essential (primary) hypertension: Secondary | ICD-10-CM | POA: Diagnosis not present

## 2017-04-05 DIAGNOSIS — Z79899 Other long term (current) drug therapy: Secondary | ICD-10-CM | POA: Diagnosis not present

## 2017-04-05 DIAGNOSIS — Z6837 Body mass index (BMI) 37.0-37.9, adult: Secondary | ICD-10-CM | POA: Diagnosis not present

## 2017-04-05 DIAGNOSIS — N185 Chronic kidney disease, stage 5: Secondary | ICD-10-CM

## 2017-04-05 DIAGNOSIS — D631 Anemia in chronic kidney disease: Secondary | ICD-10-CM | POA: Diagnosis not present

## 2017-04-05 DIAGNOSIS — N184 Chronic kidney disease, stage 4 (severe): Secondary | ICD-10-CM | POA: Diagnosis not present

## 2017-04-05 DIAGNOSIS — D508 Other iron deficiency anemias: Secondary | ICD-10-CM

## 2017-04-05 DIAGNOSIS — I129 Hypertensive chronic kidney disease with stage 1 through stage 4 chronic kidney disease, or unspecified chronic kidney disease: Secondary | ICD-10-CM | POA: Diagnosis not present

## 2017-04-05 DIAGNOSIS — E1122 Type 2 diabetes mellitus with diabetic chronic kidney disease: Secondary | ICD-10-CM | POA: Diagnosis not present

## 2017-04-05 DIAGNOSIS — Z7901 Long term (current) use of anticoagulants: Secondary | ICD-10-CM

## 2017-04-05 DIAGNOSIS — I482 Chronic atrial fibrillation, unspecified: Secondary | ICD-10-CM

## 2017-04-05 DIAGNOSIS — N2581 Secondary hyperparathyroidism of renal origin: Secondary | ICD-10-CM | POA: Diagnosis not present

## 2017-04-05 DIAGNOSIS — Z720 Tobacco use: Secondary | ICD-10-CM | POA: Diagnosis not present

## 2017-04-05 LAB — CBC WITH DIFFERENTIAL/PLATELET
BASOS ABS: 0 {cells}/uL (ref 0–200)
Basophils Relative: 0 %
Eosinophils Absolute: 183 cells/uL (ref 15–500)
Eosinophils Relative: 3 %
HCT: 28.7 % — ABNORMAL LOW (ref 38.5–50.0)
Hemoglobin: 9.2 g/dL — ABNORMAL LOW (ref 13.2–17.1)
LYMPHS PCT: 15 %
Lymphs Abs: 915 cells/uL (ref 850–3900)
MCH: 30.7 pg (ref 27.0–33.0)
MCHC: 32.1 g/dL (ref 32.0–36.0)
MCV: 95.7 fL (ref 80.0–100.0)
MONOS PCT: 12 %
MPV: 9.1 fL (ref 7.5–12.5)
Monocytes Absolute: 732 cells/uL (ref 200–950)
Neutro Abs: 4270 cells/uL (ref 1500–7800)
Neutrophils Relative %: 70 %
PLATELETS: 171 10*3/uL (ref 140–400)
RBC: 3 MIL/uL — ABNORMAL LOW (ref 4.20–5.80)
RDW: 15.8 % — AB (ref 11.0–15.0)
WBC: 6.1 10*3/uL (ref 3.8–10.8)

## 2017-04-05 NOTE — Progress Notes (Signed)
Subjective:    Patient ID: Robert Stanton, male    DOB: 09/20/1941, 76 y.o.   MRN: [id number]  HPI  Patient is a 76 yo MWM with HTN, ASCAD/CABG/St Jude AoV/cAfib, COPD/OSA/CPAP with ESRD and maturing AVF (05.18.18)  who had recent 25 # weight gain consequent formulary denial and change of Bumex toLasix and had a 25# weight gain with last recent labs showing worsening renal functions. His Nephrologist today changed his lasix to Torsemide and adjusted his newly added Zaroxolyn. Patient was only lost 2 # after adding Zaroxolyn 10 days ago. Patient reports feeling abd girth is increased , but denies any Orthopnea/PND. He does have chronic dependent edema.   Medication Sig  . albuterol (PROVENTIL) (2.5 MG/3ML) 0.083% nebulizer solution Take 3 mLs (2.5 mg total) by nebulization every 4 (four) hours as needed for wheezing or shortness of breath.  . allopurinol (ZYLOPRIM) 300 MG tablet TAKE 1 TABLET BY MOUTH DAILY. TO PREVENT GOUT  . amiodarone (PACERONE) 200 MG tablet TAKE 1 TABLET (200 MG TOTAL) BY MOUTH DAILY.  [name] aspirin EC 81 MG tablet Take 81 mg by mouth daily.  . Cholecalciferol (VITAMIN D3) 5000 units TABS Take 10,000 Units by mouth daily.  . clotrimazole-betamethasone (LOTRISONE) cream Apply 1 application topically daily as needed (rash/ skin irritation).  . fenofibrate (TRICOR) 145 MG tablet TAKE 1 TABLET DAILY FOR BLOOD FATS  . isosorbide mononitrate (IMDUR) 30 MG 24 hr tablet TAKE 1 TABLET BY MOUTH EVERY DAY  . levothyroxine (SYNTHROID, LEVOTHROID) 50 MCG tablet TAKE 1 TABLET BY MOUTH EVERY DAY  . metolazone (ZAROXOLYN) 5 MG tablet Take 1 tablet daily as directed for fluid/swelling  . montelukast (SINGULAIR) 10 MG tablet TAKE 1 TABLET (10 MG TOTAL) BY MOUTH DAILY.  [name] NITROSTAT 0.4 MG SL tablet PLACE 1 TABLET (0.4 MG TOTAL) UNDER THE TONGUE EVERY 5 (FIVE) MINUTES AS NEEDED FOR CHEST PAIN.  [name] pravastatin (PRAVACHOL) 40 MG tablet TAKE 1 TABLET (40 MG TOTAL) BY MOUTH EVERY MORNING.  [name]  PRESCRIPTION MEDICATION Inhale into the lungs See admin instructions. Use CPAP whenever sleeping  . sertraline (ZOLOFT) 50 MG tablet TAKE 1 TABLET (50 MG TOTAL) BY MOUTH DAILY.  . verapamil (CALAN) 80 MG tablet TAKE 1 TABLET (80 MG TOTAL) BY MOUTH 2 (TWO) TIMES DAILY.  . vitamin B-12 (CYANOCOBALAMIN) 1000 MCG tablet Take 1,000 mcg by mouth daily.  [name] warfarin (COUMADIN) 5 MG tablet Take 1 tablet daily or as directed by your doctor.  . furosemide (LASIX) 80 MG tablet Take 1 tablet  1 or 2 x / day as directed for BP, heart and fluid retention   Allergies  Allergen Reactions  . Chantix [Varenicline] Nausea And Vomiting  . Lyrica [Pregabalin] Other (See Comments)    Swelling, couldn't breathe  . Nsaids Other (See Comments)    Cannot take while on coumadin  . Simvastatin Other (See Comments)    Pt said he had a "bleed out" after taking it  . Ziac [Bisoprolol-Hydrochlorothiazide] Other (See Comments)    fatigue   Past Medical History:  Diagnosis Date  . AAA (abdominal aortic aneurysm) ([place])   . Arthritis   . CAD (coronary artery disease)    a. CABG '00. b. all SVGs occluded 2010. c. low risk Nuc 2012   . Cataracts, bilateral   . Chronic anticoagulation   . CKD (chronic kidney disease), stage III   . COPD (chronic obstructive pulmonary disease) ([place])   . GERD (gastroesophageal reflux disease)   . Gout   .  Heart murmur   . Hiatal hernia   . History of Doppler ultrasound 04/05/2013   LEAs; small distal abd. aoritc aneuysm is stable; fem-pop graft to R leg has 50-69% blockage  . History of echocardiogram 04/17/2013   EF 40-45%; LV hypertrophy; LV mild-mod dilated; AV regurg.; LA severely diated, RA mildly dilated  . History of nuclear stress test 05/14/2011   Dipyridamole; moderate perfusion defect in Basal Infrioer & Mid Inferior region - consistent w/infarct or scar; global LV systolic function mildly reduced; EKG negative for ischemia; low risk scan  . HOH (hard of hearing)   .  Hyperlipidemia   . Hypertension   . Hypothyroidism   . Ischemic cardiomyopathy   . Kidney cysts   . Mechanical heart valve present    a. Hx mechanical St Jude AVR 2000.  [name] OSA (obstructive sleep apnea)    wears CPAP  . Peripheral arterial disease ([place])    a. s/p aorto bifem BG;  b. 11/2013 PVA distal R Fem-fem stenosis;  c. 12/2013 PTA of dist R femoral bypass graft.  . Permanent atrial fibrillation ([place])   . Prediabetes   . Ventricular tachycardia (paroxysmal) ([place])    Seen by Dr. Lovena Le with EP, started on amiodarone  . Vitamin B deficiency    Past Surgical History:  Procedure Laterality Date  . AORTIC VALVE REPLACEMENT     St. Jude  . AV FISTULA PLACEMENT Left 03/04/2017   Procedure: ARTERIOVENOUS (AV) FISTULA CREATION LEFT ARM;  Surgeon: Serafina Mitchell, MD;  Location: [facility];  Service: Vascular;  Laterality: Left;  . CARDIAC CATHETERIZATION  11/25/2008   loss of 2/3 (RCA, OM) bypass grafts with patent internal mammary artery to LAD; large collateral filling of RCA ; osteal narrowing of circumflex of ~50%  . CARDIOVERSION  8/282012  . CORONARY ARTERY BYPASS GRAFT  2000   3 vessel; LIMA to LAD; RCA, OM  . FEMORAL BYPASS    . LOWER EXTREMITY ANGIOGRAM N/A 12/03/2013   Procedure: LOWER EXTREMITY ANGIOGRAM;  Surgeon: Lorretta Harp, MD;  Location: [facility] CATH LAB;  Service: Cardiovascular;  Laterality: N/A;  . LOWER EXTREMITY ANGIOGRAM N/A 01/07/2014   Procedure: LOWER EXTREMITY ANGIOGRAM;  Surgeon: Lorretta Harp, MD;  Location: [facility] CATH LAB;  Service: Cardiovascular;  Laterality: N/A;  . shunts in femoral arteries    . stents in kidneys     Review of Systems  10 point systems review negative except as above.    Objective:   Physical Exam  BP (!) 118/54   Pulse 74   Temp 97.3 F (36.3 C)   Resp 20   Ht 5\' 7"  (1.702 m)   Wt 243 lb 6.4 oz (110.4 kg)   BMI 38.12 kg/m   Obese - in no acute distress. Skin clear w/o rash.  HEENT - WNL. Neck - supple.  Chest - Clear equal  BS. Cor - Soft HS. Sl irregRR w/prosthetic valve sounds sig MGR. PP 2-3+ edema. MS- FROM w/o deformities.  Gait Nl. Neuro -  Nl w/o focal abnormalities.    Assessment & Plan:   1. Essential hypertension  - CBC with Differential/Platelet - BASIC METABOLIC PANEL WITH GFR  2. Atrial fibrillation, chronic ([place])  - Protime-INR  3. Type 2 diabetes mellitus with stage 5 chronic kidney disease not on chronic dialysis, without long-term current use of insulin (HCC)  - BASIC METABOLIC PANEL WITH GFR  4. Long term current use of anticoagulant therapy  - Protime-INR  5. Medication  management  - CBC with Differential/Platelet - BASIC METABOLIC PANEL WITH GFR  6. Other iron deficiency anemia  - Iron, TIBC and Ferritin Panel

## 2017-04-06 ENCOUNTER — Other Ambulatory Visit (HOSPITAL_COMMUNITY): Payer: Self-pay | Admitting: *Deleted

## 2017-04-06 LAB — BASIC METABOLIC PANEL WITH GFR
BUN: 107 mg/dL — AB (ref 7–25)
CHLORIDE: 93 mmol/L — AB (ref 98–110)
CO2: 28 mmol/L (ref 20–31)
CREATININE: 6.57 mg/dL — AB (ref 0.70–1.18)
Calcium: 9 mg/dL (ref 8.6–10.3)
GFR, EST AFRICAN AMERICAN: 9 mL/min — AB (ref >=60)
GFR, Est Non African American: 8 mL/min — ABNORMAL LOW (ref >=60)
GLUCOSE: 77 mg/dL (ref 65–99)
Potassium: 3.5 mmol/L (ref 3.5–5.3)
Sodium: 136 mmol/L (ref 135–146)

## 2017-04-06 LAB — PROTIME-INR
INR: 3.3 — ABNORMAL HIGH
Prothrombin Time: 33.2 s — ABNORMAL HIGH (ref 9.0–11.5)

## 2017-04-06 LAB — IRON,TIBC AND FERRITIN PANEL
%SAT: 14 % — ABNORMAL LOW (ref 15–60)
FERRITIN: 153 ng/mL (ref 20–380)
IRON: 45 ug/dL — AB (ref 50–180)
TIBC: 332 ug/dL (ref 250–425)

## 2017-04-07 ENCOUNTER — Ambulatory Visit (HOSPITAL_COMMUNITY)
Admission: RE | Admit: 2017-04-07 | Discharge: 2017-04-07 | Disposition: A | Discharge status: Home / Self Care | Payer: PPO | Source: Ambulatory Visit | Attending: Nephrology | Admitting: Nephrology

## 2017-04-07 DIAGNOSIS — D631 Anemia in chronic kidney disease: Secondary | ICD-10-CM | POA: Insufficient documentation

## 2017-04-07 DIAGNOSIS — N185 Chronic kidney disease, stage 5: Secondary | ICD-10-CM

## 2017-04-07 LAB — POCT HEMOGLOBIN-HEMACUE: Hemoglobin: 8.9 g/dL — ABNORMAL LOW (ref 13.0–17.0)

## 2017-04-07 MED ORDER — EPOETIN ALFA 10000 UNIT/ML IJ SOLN
10000.0000 [IU] | INTRAMUSCULAR | Status: DC
  Administered 2017-04-07: 10000 [IU] via SUBCUTANEOUS

## 2017-04-07 MED ORDER — SODIUM CHLORIDE 0.9 % IV SOLN
510.0000 mg | Freq: Once | INTRAVENOUS | Status: AC
  Administered 2017-04-07: 510 mg via INTRAVENOUS
  Filled 2017-04-07: qty 17

## 2017-04-07 MED ORDER — EPOETIN ALFA 10000 UNIT/ML IJ SOLN
INTRAMUSCULAR | Status: AC
  Administered 2017-04-07: 10000 [IU] via SUBCUTANEOUS
  Filled 2017-04-07: qty 1

## 2017-04-07 MED ORDER — CLONIDINE HCL 0.1 MG PO TABS: 0.1000 mg | ORAL_TABLET | Freq: Once | ORAL | Status: DC | PRN

## 2017-04-07 NOTE — Discharge Instructions (Signed)
Epoetin Alfa injection °What is this medicine? °EPOETIN ALFA (e POE e tin AL fa) helps your body make more red blood cells. This medicine is used to treat anemia caused by chronic kidney failure, cancer chemotherapy, or HIV-therapy. It may also be used before surgery if you have anemia. °This medicine may be used for other purposes; ask your health care provider or pharmacist if you have questions. °COMMON BRAND NAME(S): Epogen, Procrit °What should I tell my health care provider before I take this medicine? °They need to know if you have any of these conditions: °-blood clotting disorders °-cancer patient not on chemotherapy °-cystic fibrosis °-heart disease, such as angina or heart failure °-hemoglobin level of 12 g/dL or greater °-high blood pressure °-low levels of folate, iron, or vitamin B12 °-seizures °-an unusual or allergic reaction to erythropoietin, albumin, benzyl alcohol, hamster proteins, other medicines, foods, dyes, or preservatives °-pregnant or trying to get pregnant °-breast-feeding °How should I use this medicine? °This medicine is for injection into a vein or under the skin. It is usually given by a health care professional in a hospital or clinic setting. °If you get this medicine at home, you will be taught how to prepare and give this medicine. Use exactly as directed. Take your medicine at regular intervals. Do not take your medicine more often than directed. °It is important that you put your used needles and syringes in a special sharps container. Do not put them in a trash can. If you do not have a sharps container, call your pharmacist or healthcare provider to get one. °A special MedGuide will be given to you by the pharmacist with each prescription and refill. Be sure to read this information carefully each time. °Talk to your pediatrician regarding the use of this medicine in children. While this drug may be prescribed for selected conditions, precautions do apply. °Overdosage: If you  think you have taken too much of this medicine contact a poison control center or emergency room at once. °NOTE: This medicine is only for you. Do not share this medicine with others. °What if I miss a dose? °If you miss a dose, take it as soon as you can. If it is almost time for your next dose, take only that dose. Do not take double or extra doses. °What may interact with this medicine? °Do not take this medicine with any of the following medications: °-darbepoetin alfa °This list may not describe all possible interactions. Give your health care provider a list of all the medicines, herbs, non-prescription drugs, or dietary supplements you use. Also tell them if you smoke, drink alcohol, or use illegal drugs. Some items may interact with your medicine. °What should I watch for while using this medicine? °Your condition will be monitored carefully while you are receiving this medicine. °You may need blood work done while you are taking this medicine. °What side effects may I notice from receiving this medicine? °Side effects that you should report to your doctor or health care professional as soon as possible: °-allergic reactions like skin rash, itching or hives, swelling of the face, lips, or tongue °-breathing problems °-changes in vision °-chest pain °-confusion, trouble speaking or understanding °-feeling faint or lightheaded, falls °-high blood pressure °-muscle aches or pains °-pain, swelling, warmth in the leg °-rapid weight gain °-severe headaches °-sudden numbness or weakness of the face, arm or leg °-trouble walking, dizziness, loss of balance or coordination °-seizures (convulsions) °-swelling of the ankles, feet, hands °-unusually weak or tired °  Side effects that usually do not require medical attention (report to your doctor or health care professional if they continue or are bothersome): °-diarrhea °-fever, chills (flu-like symptoms) °-headaches °-nausea, vomiting °-redness, stinging, or swelling at  site where injected °This list may not describe all possible side effects. Call your doctor for medical advice about side effects. You may report side effects to FDA at 1-800-FDA-1088. °Where should I keep my medicine? °Keep out of the reach of children. °Store in a refrigerator between 2 and 8 degrees C (36 and 46 degrees F). Do not freeze or shake. Throw away any unused portion if using a single-dose vial. Multi-dose vials can be kept in the refrigerator for up to 21 days after the initial dose. Throw away unused medicine. °NOTE: This sheet is a summary. It may not cover all possible information. If you have questions about this medicine, talk to your doctor, pharmacist, or health care provider. °© 2018 [employer] (2016-05-24 19:42:31) ° °Ferumoxytol injection °What is this medicine? °FERUMOXYTOL is an iron complex. Iron is used to make healthy red blood cells, which carry oxygen and nutrients throughout the body. This medicine is used to treat iron deficiency anemia in people with chronic kidney disease. °This medicine may be used for other purposes; ask your health care provider or pharmacist if you have questions. °COMMON BRAND NAME(S): Feraheme °What should I tell my health care provider before I take this medicine? °They need to know if you have any of these conditions: °-anemia not caused by low iron levels °-high levels of iron in the blood °-magnetic resonance imaging (MRI) test scheduled °-an unusual or allergic reaction to iron, other medicines, foods, dyes, or preservatives °-pregnant or trying to get pregnant °-breast-feeding °How should I use this medicine? °This medicine is for injection into a vein. It is given by a health care professional in a hospital or clinic setting. °Talk to your pediatrician regarding the use of this medicine in children. Special care may be needed. °Overdosage: If you think you have taken too much of this medicine contact a poison control center or emergency  room at once. °NOTE: This medicine is only for you. Do not share this medicine with others. °What if I miss a dose? °It is important not to miss your dose. Call your doctor or health care professional if you are unable to keep an appointment. °What may interact with this medicine? °This medicine may interact with the following medications: °-other iron products °This list may not describe all possible interactions. Give your health care provider a list of all the medicines, herbs, non-prescription drugs, or dietary supplements you use. Also tell them if you smoke, drink alcohol, or use illegal drugs. Some items may interact with your medicine. °What should I watch for while using this medicine? °Visit your doctor or healthcare professional regularly. Tell your doctor or healthcare professional if your symptoms do not start to get better or if they get worse. You may need blood work done while you are taking this medicine. °You may need to follow a special diet. Talk to your doctor. Foods that contain iron include: whole grains/cereals, dried fruits, beans, or peas, leafy green vegetables, and organ meats (liver, kidney). °What side effects may I notice from receiving this medicine? °Side effects that you should report to your doctor or health care professional as soon as possible: °-allergic reactions like skin rash, itching or hives, swelling of the face, lips, or tongue °-breathing problems °-changes in blood pressure °-feeling   faint or lightheaded, falls °-fever or chills °-flushing, sweating, or hot feelings °-swelling of the ankles or feet °Side effects that usually do not require medical attention (report to your doctor or health care professional if they continue or are bothersome): °-diarrhea °-headache °-nausea, vomiting °-stomach pain °This list may not describe all possible side effects. Call your doctor for medical advice about side effects. You may report side effects to FDA at 1-800-FDA-1088. °Where  should I keep my medicine? °This drug is given in a hospital or clinic and will not be stored at home. °NOTE: This sheet is a summary. It may not cover all possible information. If you have questions about this medicine, talk to your doctor, pharmacist, or health care provider. °© 2018 [employer] (2015-11-06 12:41:49) ° ° ° °

## 2017-04-08 ENCOUNTER — Other Ambulatory Visit: Payer: Self-pay | Admitting: Internal Medicine

## 2017-04-10 ENCOUNTER — Emergency Department (HOSPITAL_COMMUNITY): Payer: PPO

## 2017-04-10 ENCOUNTER — Inpatient Hospital Stay (HOSPITAL_COMMUNITY)
Admission: EM | Admit: 2017-04-10 | Discharge: 2017-04-21 | DRG: 377 | Disposition: A | Discharge status: Hospice | Payer: PPO | Attending: Internal Medicine | Admitting: Internal Medicine

## 2017-04-10 ENCOUNTER — Encounter (HOSPITAL_COMMUNITY): Payer: Self-pay | Admitting: Emergency Medicine

## 2017-04-10 DIAGNOSIS — K922 Gastrointestinal hemorrhage, unspecified: Secondary | ICD-10-CM

## 2017-04-10 DIAGNOSIS — R195 Other fecal abnormalities: Secondary | ICD-10-CM | POA: Diagnosis not present

## 2017-04-10 DIAGNOSIS — I472 Ventricular tachycardia: Secondary | ICD-10-CM | POA: Diagnosis present

## 2017-04-10 DIAGNOSIS — Z7901 Long term (current) use of anticoagulants: Secondary | ICD-10-CM | POA: Diagnosis not present

## 2017-04-10 DIAGNOSIS — F411 Generalized anxiety disorder: Secondary | ICD-10-CM | POA: Diagnosis not present

## 2017-04-10 DIAGNOSIS — K921 Melena: Secondary | ICD-10-CM | POA: Diagnosis not present

## 2017-04-10 DIAGNOSIS — Z515 Encounter for palliative care: Secondary | ICD-10-CM | POA: Diagnosis not present

## 2017-04-10 DIAGNOSIS — I5043 Acute on chronic combined systolic (congestive) and diastolic (congestive) heart failure: Secondary | ICD-10-CM | POA: Diagnosis not present

## 2017-04-10 DIAGNOSIS — I255 Ischemic cardiomyopathy: Secondary | ICD-10-CM | POA: Diagnosis not present

## 2017-04-10 DIAGNOSIS — R42 Dizziness and giddiness: Secondary | ICD-10-CM | POA: Diagnosis not present

## 2017-04-10 DIAGNOSIS — E1122 Type 2 diabetes mellitus with diabetic chronic kidney disease: Secondary | ICD-10-CM | POA: Diagnosis present

## 2017-04-10 DIAGNOSIS — Z7982 Long term (current) use of aspirin: Secondary | ICD-10-CM

## 2017-04-10 DIAGNOSIS — K2990 Gastroduodenitis, unspecified, without bleeding: Secondary | ICD-10-CM

## 2017-04-10 DIAGNOSIS — K449 Diaphragmatic hernia without obstruction or gangrene: Secondary | ICD-10-CM | POA: Diagnosis not present

## 2017-04-10 DIAGNOSIS — J90 Pleural effusion, not elsewhere classified: Secondary | ICD-10-CM | POA: Diagnosis not present

## 2017-04-10 DIAGNOSIS — E8770 Fluid overload, unspecified: Secondary | ICD-10-CM | POA: Diagnosis not present

## 2017-04-10 DIAGNOSIS — N186 End stage renal disease: Secondary | ICD-10-CM | POA: Diagnosis present

## 2017-04-10 DIAGNOSIS — K2971 Gastritis, unspecified, with bleeding: Secondary | ICD-10-CM | POA: Diagnosis present

## 2017-04-10 DIAGNOSIS — E039 Hypothyroidism, unspecified: Secondary | ICD-10-CM | POA: Diagnosis present

## 2017-04-10 DIAGNOSIS — F17210 Nicotine dependence, cigarettes, uncomplicated: Secondary | ICD-10-CM | POA: Diagnosis present

## 2017-04-10 DIAGNOSIS — R109 Unspecified abdominal pain: Secondary | ICD-10-CM | POA: Diagnosis not present

## 2017-04-10 DIAGNOSIS — I420 Dilated cardiomyopathy: Secondary | ICD-10-CM | POA: Diagnosis not present

## 2017-04-10 DIAGNOSIS — I714 Abdominal aortic aneurysm, without rupture, unspecified: Secondary | ICD-10-CM | POA: Diagnosis present

## 2017-04-10 DIAGNOSIS — E1151 Type 2 diabetes mellitus with diabetic peripheral angiopathy without gangrene: Secondary | ICD-10-CM | POA: Diagnosis present

## 2017-04-10 DIAGNOSIS — K222 Esophageal obstruction: Secondary | ICD-10-CM

## 2017-04-10 DIAGNOSIS — R059 Cough, unspecified: Secondary | ICD-10-CM

## 2017-04-10 DIAGNOSIS — D500 Iron deficiency anemia secondary to blood loss (chronic): Secondary | ICD-10-CM | POA: Diagnosis not present

## 2017-04-10 DIAGNOSIS — I5040 Unspecified combined systolic (congestive) and diastolic (congestive) heart failure: Secondary | ICD-10-CM | POA: Diagnosis not present

## 2017-04-10 DIAGNOSIS — I4821 Permanent atrial fibrillation: Secondary | ICD-10-CM | POA: Diagnosis present

## 2017-04-10 DIAGNOSIS — R0602 Shortness of breath: Secondary | ICD-10-CM

## 2017-04-10 DIAGNOSIS — Q2733 Arteriovenous malformation of digestive system vessel: Secondary | ICD-10-CM | POA: Diagnosis not present

## 2017-04-10 DIAGNOSIS — M109 Gout, unspecified: Secondary | ICD-10-CM | POA: Diagnosis present

## 2017-04-10 DIAGNOSIS — I132 Hypertensive heart and chronic kidney disease with heart failure and with stage 5 chronic kidney disease, or end stage renal disease: Secondary | ICD-10-CM | POA: Diagnosis present

## 2017-04-10 DIAGNOSIS — I482 Chronic atrial fibrillation: Secondary | ICD-10-CM | POA: Diagnosis present

## 2017-04-10 DIAGNOSIS — I509 Heart failure, unspecified: Secondary | ICD-10-CM | POA: Diagnosis not present

## 2017-04-10 DIAGNOSIS — G4733 Obstructive sleep apnea (adult) (pediatric): Secondary | ICD-10-CM | POA: Diagnosis present

## 2017-04-10 DIAGNOSIS — R05 Cough: Secondary | ICD-10-CM | POA: Diagnosis not present

## 2017-04-10 DIAGNOSIS — Z4901 Encounter for fitting and adjustment of extracorporeal dialysis catheter: Secondary | ICD-10-CM | POA: Diagnosis not present

## 2017-04-10 DIAGNOSIS — K253 Acute gastric ulcer without hemorrhage or perforation: Secondary | ICD-10-CM

## 2017-04-10 DIAGNOSIS — I10 Essential (primary) hypertension: Secondary | ICD-10-CM | POA: Diagnosis present

## 2017-04-10 DIAGNOSIS — I129 Hypertensive chronic kidney disease with stage 1 through stage 4 chronic kidney disease, or unspecified chronic kidney disease: Secondary | ICD-10-CM | POA: Diagnosis not present

## 2017-04-10 DIAGNOSIS — N179 Acute kidney failure, unspecified: Secondary | ICD-10-CM

## 2017-04-10 DIAGNOSIS — D689 Coagulation defect, unspecified: Secondary | ICD-10-CM | POA: Diagnosis not present

## 2017-04-10 DIAGNOSIS — E43 Unspecified severe protein-calorie malnutrition: Secondary | ICD-10-CM | POA: Diagnosis present

## 2017-04-10 DIAGNOSIS — Z79899 Other long term (current) drug therapy: Secondary | ICD-10-CM

## 2017-04-10 DIAGNOSIS — R791 Abnormal coagulation profile: Secondary | ICD-10-CM | POA: Diagnosis present

## 2017-04-10 DIAGNOSIS — J449 Chronic obstructive pulmonary disease, unspecified: Secondary | ICD-10-CM | POA: Diagnosis present

## 2017-04-10 DIAGNOSIS — K31811 Angiodysplasia of stomach and duodenum with bleeding: Secondary | ICD-10-CM | POA: Diagnosis not present

## 2017-04-10 DIAGNOSIS — K2970 Gastritis, unspecified, without bleeding: Secondary | ICD-10-CM | POA: Diagnosis not present

## 2017-04-10 DIAGNOSIS — N184 Chronic kidney disease, stage 4 (severe): Secondary | ICD-10-CM

## 2017-04-10 DIAGNOSIS — I2510 Atherosclerotic heart disease of native coronary artery without angina pectoris: Secondary | ICD-10-CM | POA: Diagnosis present

## 2017-04-10 DIAGNOSIS — Z952 Presence of prosthetic heart valve: Secondary | ICD-10-CM | POA: Diagnosis not present

## 2017-04-10 DIAGNOSIS — N185 Chronic kidney disease, stage 5: Secondary | ICD-10-CM | POA: Diagnosis not present

## 2017-04-10 DIAGNOSIS — M1000 Idiopathic gout, unspecified site: Secondary | ICD-10-CM | POA: Diagnosis not present

## 2017-04-10 DIAGNOSIS — I739 Peripheral vascular disease, unspecified: Secondary | ICD-10-CM | POA: Diagnosis present

## 2017-04-10 DIAGNOSIS — D62 Acute posthemorrhagic anemia: Secondary | ICD-10-CM | POA: Diagnosis not present

## 2017-04-10 DIAGNOSIS — I5041 Acute combined systolic (congestive) and diastolic (congestive) heart failure: Secondary | ICD-10-CM | POA: Diagnosis not present

## 2017-04-10 DIAGNOSIS — K219 Gastro-esophageal reflux disease without esophagitis: Secondary | ICD-10-CM | POA: Diagnosis not present

## 2017-04-10 DIAGNOSIS — D131 Benign neoplasm of stomach: Secondary | ICD-10-CM | POA: Diagnosis not present

## 2017-04-10 DIAGNOSIS — K7460 Unspecified cirrhosis of liver: Secondary | ICD-10-CM | POA: Diagnosis present

## 2017-04-10 DIAGNOSIS — R110 Nausea: Secondary | ICD-10-CM | POA: Diagnosis not present

## 2017-04-10 DIAGNOSIS — K920 Hematemesis: Secondary | ICD-10-CM | POA: Diagnosis not present

## 2017-04-10 LAB — COMPREHENSIVE METABOLIC PANEL
ALBUMIN: 2.8 g/dL — AB (ref 3.5–5.0)
ALT: 26 U/L (ref 17–63)
AST: 69 U/L — ABNORMAL HIGH (ref 15–41)
Alkaline Phosphatase: 69 U/L (ref 38–126)
Anion gap: 16 — ABNORMAL HIGH (ref 5–15)
BUN: 138 mg/dL — ABNORMAL HIGH (ref 6–20)
CALCIUM: 8.7 mg/dL — AB (ref 8.9–10.3)
CHLORIDE: 92 mmol/L — AB (ref 101–111)
CO2: 22 mmol/L (ref 22–32)
Creatinine, Ser: 9.9 mg/dL — ABNORMAL HIGH (ref 0.61–1.24)
GFR calc non Af Amer: 5 mL/min — ABNORMAL LOW (ref >60)
GFR, EST AFRICAN AMERICAN: 5 mL/min — AB (ref >60)
GLUCOSE: 116 mg/dL — AB (ref 65–99)
POTASSIUM: 3.6 mmol/L (ref 3.5–5.1)
SODIUM: 130 mmol/L — AB (ref 135–145)
Total Bilirubin: 1.8 mg/dL — ABNORMAL HIGH (ref 0.3–1.2)
Total Protein: 7.3 g/dL (ref 6.5–8.1)

## 2017-04-10 LAB — I-STAT TROPONIN, ED: Troponin i, poc: 0.06 ng/mL (ref 0.00–0.08)

## 2017-04-10 LAB — POC OCCULT BLOOD, ED: FECAL OCCULT BLD: POSITIVE — AB

## 2017-04-10 LAB — TYPE AND SCREEN
ABO/RH(D): A POS
Antibody Screen: NEGATIVE

## 2017-04-10 LAB — PROTIME-INR
INR: 4.34 — AB
PROTHROMBIN TIME: 42.7 s — AB (ref 11.4–15.2)

## 2017-04-10 LAB — CBC
HEMATOCRIT: 28 % — AB (ref 39.0–52.0)
Hemoglobin: 9.4 g/dL — ABNORMAL LOW (ref 13.0–17.0)
MCH: 31.2 pg (ref 26.0–34.0)
MCHC: 33.6 g/dL (ref 30.0–36.0)
MCV: 93 fL (ref 78.0–100.0)
Platelets: 176 10*3/uL (ref 150–400)
RBC: 3.01 MIL/uL — AB (ref 4.22–5.81)
RDW: 16.7 % — ABNORMAL HIGH (ref 11.5–15.5)
WBC: 7.9 10*3/uL (ref 4.0–10.5)

## 2017-04-10 LAB — LIPASE, BLOOD: Lipase: 45 U/L (ref 11–51)

## 2017-04-10 MED ORDER — PANTOPRAZOLE SODIUM 40 MG IV SOLR
40.0000 mg | Freq: Once | INTRAVENOUS | Status: AC
  Administered 2017-04-10: 40 mg via INTRAVENOUS
  Filled 2017-04-10: qty 40

## 2017-04-10 MED ORDER — VITAMIN K1 10 MG/ML IJ SOLN
10.0000 mg | Freq: Once | INTRAVENOUS | Status: AC
  Administered 2017-04-11: 10 mg via INTRAVENOUS
  Filled 2017-04-10: qty 1

## 2017-04-10 MED ORDER — SODIUM CHLORIDE 0.9 % IV BOLUS (SEPSIS)
500.0000 mL | Freq: Once | INTRAVENOUS | Status: AC
  Administered 2017-04-10: 500 mL via INTRAVENOUS

## 2017-04-10 MED ORDER — SODIUM CHLORIDE 0.9 % IV SOLN
10.0000 mL/h | Freq: Once | INTRAVENOUS | Status: AC
  Administered 2017-04-11: 10 mL/h via INTRAVENOUS

## 2017-04-10 MED ORDER — IOPAMIDOL (ISOVUE-300) INJECTION 61%
INTRAVENOUS | Status: AC
  Filled 2017-04-10: qty 30

## 2017-04-10 NOTE — ED Notes (Signed)
Per Dr. Tamera Punt, pt to go to CT scan without drinking oral contrast. This nurse called CT to notify them that pt is ready for CT and to be scanned w/o the contrast being administered. Kim, in [place] aware.

## 2017-04-10 NOTE — H&P (Addendum)
History and Physical    [name] OEV:[id number] DOB: 1941/02/09 DOA: 04/10/2017  PCP: Unk Pinto, MD   Patient coming from: Home  Chief Complaint: Bleeding per rectum.  HPI: Robert Stanton is a 76 y.o. male with medical history significant for- hypertension, coronary artery disease- CABG- 2010,  atrial fibrillation CKD5, hypertension, esophageal strictures, gout, COPD, OSA, mechanical aortic valve, hypothyroidism, peripheral artery disease, type II DM. Patient came in with complaints of bleeding from his rectum was started about 3 days ago, he also reports dark stools- 3-4 days. He denies abdominal pain. In the ED he had an episode of vomiting- with coffee-ground emesis.  He endorses dizziness that started Saturday morning- 04/09/17. This appears to be a chronic issue for patient. Pt denies NSAID use. Patient reports a history of peptic ulcer disease diagnosed with EGD- several years ago. Last EGD 10/2012- was esophageal stricture and erosions, erosive gastritis. Last colonoscopy was about 10 years ago- reports it was normal. He also notes a chronic cough that is productive today, though he has a history of COPD. His shortness of breath is at baseline. He denies fever or chills.  ED Course: Patient was mildly tachycardic pulse of 108, blood pressure soft 102/48. O2 sats- 95-100% on room air (recording of 66% most likely spurious). Blood work revealed- hemoglobin stable at 9.4, patient's baseline, platelets 176, sodium low at 130, K3.6, creatinine elevated at 9.9 from last check 04/05/2017- 6.57. BUN marked elevation at 138. Lipase normal at 45, INR elevated at 4.34. Chest x-ray showed loculated pleural effusion. Patient also had CT abdomen and pelvis done in the ED- 80 suspected cirrhosis, moderate ascites, anasarca,  new small to moderate pleural effusions suspected loculation on the right. Patient was given IV Protonix 40 mg in the ED. FFP and IV vitamin K 10mg  was also  given.  Review of Systems: CONSTITUTIONAL- No Fever, or change in appetite. SKIN- No Rash, colour changes or itching. HEAD- No Headache or dizziness. Mouth/throat- No Sorethroat, or bleeding gums. CARDIAC- No Palpitations, or chest pain. URINARY- No Frequency, urgency, straining or dysuria. NEUROLOGIC- No Numbness, syncope, seizures or burning. [facility]- Denies depression or anxiety.  Past Medical History:  Diagnosis Date  . AAA (abdominal aortic aneurysm) ([place])   . Arthritis   . CAD (coronary artery disease)    a. CABG '00. b. all SVGs occluded 2010. c. low risk Nuc 2012   . Cataracts, bilateral   . Chronic anticoagulation   . CKD (chronic kidney disease), stage III   . COPD (chronic obstructive pulmonary disease) ([place])   . GERD (gastroesophageal reflux disease)   . Gout   . Heart murmur   . Hiatal hernia   . History of Doppler ultrasound 04/05/2013   LEAs; small distal abd. aoritc aneuysm is stable; fem-pop graft to R leg has 50-69% blockage  . History of echocardiogram 04/17/2013   EF 40-45%; LV hypertrophy; LV mild-mod dilated; AV regurg.; LA severely diated, RA mildly dilated  . History of nuclear stress test 05/14/2011   Dipyridamole; moderate perfusion defect in Basal Infrioer & Mid Inferior region - consistent w/infarct or scar; global LV systolic function mildly reduced; EKG negative for ischemia; low risk scan  . HOH (hard of hearing)   . Hyperlipidemia   . Hypertension   . Hypothyroidism   . Ischemic cardiomyopathy   . Kidney cysts   . Mechanical heart valve present    a. Hx mechanical St Jude AVR 2000.  [name] OSA (obstructive sleep apnea)  wears CPAP  . Peripheral arterial disease ([place])    a. s/p aorto bifem BG;  b. 11/2013 PVA distal R Fem-fem stenosis;  c. 12/2013 PTA of dist R femoral bypass graft.  . Permanent atrial fibrillation ([place])   . Prediabetes   . Ventricular tachycardia (paroxysmal) ([place])    Seen by Dr. Lovena Le with EP, started on amiodarone  . Vitamin B  deficiency     Past Surgical History:  Procedure Laterality Date  . AORTIC VALVE REPLACEMENT     St. Jude  . AV FISTULA PLACEMENT Left 03/04/2017   Procedure: ARTERIOVENOUS (AV) FISTULA CREATION LEFT ARM;  Surgeon: Serafina Mitchell, MD;  Location: [place];  Service: Vascular;  Laterality: Left;  . CARDIAC CATHETERIZATION  11/25/2008   loss of 2/3 (RCA, OM) bypass grafts with patent internal mammary artery to LAD; large collateral filling of RCA ; osteal narrowing of circumflex of ~50%  . CARDIOVERSION  8/282012  . CORONARY ARTERY BYPASS GRAFT  2000   3 vessel; LIMA to LAD; RCA, OM  . FEMORAL BYPASS    . LOWER EXTREMITY ANGIOGRAM N/A 12/03/2013   Procedure: LOWER EXTREMITY ANGIOGRAM;  Surgeon: Lorretta Harp, MD;  Location: [facility] CATH LAB;  Service: Cardiovascular;  Laterality: N/A;  . LOWER EXTREMITY ANGIOGRAM N/A 01/07/2014   Procedure: LOWER EXTREMITY ANGIOGRAM;  Surgeon: Lorretta Harp, MD;  Location: [facility] CATH LAB;  Service: Cardiovascular;  Laterality: N/A;  . shunts in femoral arteries    . stents in kidneys       reports that he has been smoking Cigarettes.  He has a 20.00 pack-year smoking history. He has never used smokeless tobacco. He reports that he does not drink alcohol or use drugs.  Allergies  Allergen Reactions  . Chantix [Varenicline] Nausea And Vomiting  . Lyrica [Pregabalin] Other (See Comments)    Swelling, couldn't breathe  . Nsaids Other (See Comments)    Cannot take while on coumadin  . Simvastatin Other (See Comments)    Pt said he had a "bleed out" after taking it  . Ziac [Bisoprolol-Hydrochlorothiazide] Other (See Comments)    fatigue    Family History  Problem Relation Age of Onset  . Heart attack Mother   . Heart attack Father   . Lung cancer Sister        x 2  . Kidney cancer Sister   . Kidney disease Sister   . Cancer Brother        ?  . Stroke Brother     Prior to Admission medications   Medication Sig Start Date End Date Taking? Authorizing  Provider  albuterol (PROVENTIL) (2.5 MG/3ML) 0.083% nebulizer solution Take 3 mLs (2.5 mg total) by nebulization every 4 (four) hours as needed for wheezing or shortness of breath. 11/24/16  Yes Forcucci, Courtney, PA-C  allopurinol (ZYLOPRIM) 300 MG tablet TAKE 1 TABLET BY MOUTH DAILY. TO PREVENT GOUT 02/23/17  Yes Vicie Mutters, PA-C  amiodarone (PACERONE) 200 MG tablet TAKE 1 TABLET (200 MG TOTAL) BY MOUTH DAILY. 02/23/17  Yes Hilty, [name], MD  aspirin EC 81 MG tablet Take 81 mg by mouth daily.   Yes [provider]  Cholecalciferol (VITAMIN D3) 5000 units TABS Take 10,000 Units by mouth daily.   Yes [provider]  clotrimazole-betamethasone (LOTRISONE) cream Apply 1 application topically daily as needed (rash/ skin irritation).   Yes [provider]  fenofibrate (TRICOR) 145 MG tablet TAKE 1 TABLET DAILY FOR BLOOD FATS 01/30/16  Yes  Vicie Mutters, PA-C  isosorbide mononitrate (IMDUR) 30 MG 24 hr tablet TAKE 1 TABLET BY MOUTH EVERY DAY 11/07/16  Yes Unk Pinto, MD  levothyroxine (SYNTHROID, LEVOTHROID) 50 MCG tablet TAKE 1 TABLET BY MOUTH EVERY DAY 11/02/16  Yes Unk Pinto, MD  metolazone (ZAROXOLYN) 5 MG tablet Take 1 tablet daily as directed for fluid/swelling 03/28/17 06/28/17 Yes Unk Pinto, MD  montelukast (SINGULAIR) 10 MG tablet TAKE 1 TABLET (10 MG TOTAL) BY MOUTH DAILY. 11/19/16  Yes Unk Pinto, MD  NITROSTAT 0.4 MG SL tablet PLACE 1 TABLET (0.4 MG TOTAL) UNDER THE TONGUE EVERY 5 (FIVE) MINUTES AS NEEDED FOR CHEST PAIN. 02/11/16  Yes Vicie Mutters, PA-C  pravastatin (PRAVACHOL) 40 MG tablet TAKE 1 TABLET (40 MG TOTAL) BY MOUTH EVERY MORNING. 11/24/16  Yes Vicie Mutters, PA-C  sertraline (ZOLOFT) 50 MG tablet TAKE 1 TABLET (50 MG TOTAL) BY MOUTH DAILY. 10/01/16  Yes Unk Pinto, MD  torsemide (DEMADEX) 100 MG tablet Take 100 mg by mouth daily.   Yes [provider]  verapamil (CALAN) 80 MG tablet TAKE 1 TABLET (80 MG TOTAL) BY  MOUTH 2 (TWO) TIMES DAILY. 02/23/17  Yes Vicie Mutters, PA-C  vitamin B-12 (CYANOCOBALAMIN) 1000 MCG tablet Take 1,000 mcg by mouth daily.   Yes [provider]  warfarin (COUMADIN) 5 MG tablet Take 1 tablet daily or as directed by your doctor. Patient taking differently: Take 2.5 mg by mouth See admin instructions. Take 2.5 mg daily except Monday and Friday. 07/29/16  Yes Unk Pinto, MD   Physical Exam: Vitals:   04/10/17 2200 04/10/17 2230 04/10/17 2245 04/10/17 2300  BP: (!) 123/59 (!) 113/57 (!) 121/58 (!) 109/51  Pulse: 63 72 70 67  Resp: 12 19 19 20   Temp:      TempSrc:      SpO2: 95% 96% 98% 93%    Constitutional: NAD, calm, comfortable, on room air, hard of hearing Vitals:   04/10/17 2200 04/10/17 2230 04/10/17 2245 04/10/17 2300  BP: (!) 123/59 (!) 113/57 (!) 121/58 (!) 109/51  Pulse: 63 72 70 67  Resp: 12 19 19 20   Temp:      TempSrc:      SpO2: 95% 96% 98% 93%   Eyes: PERRL, lids and conjunctivae normal ENMT: Mucous membranes are moist. Posterior pharynx clear of any exudate or lesions.Normal dentition.  Neck: normal, supple, no masses, no thyromegaly Respiratory: Mild diffuse expiratory wheezing, Normal respiratory effort. No accessory muscle use.  Cardiovascular: Irregular rate and rhythm, systolic murmur , loud S2- ? Mechanical click. 2+ pitting edema to knees equal bilaterally. Abdomen: Epigastric tenderness, no masses palpated. No hepatosplenomegaly. Bowel sounds positive.  Musculoskeletal: no clubbing / cyanosis. No joint deformity upper and lower extremities. Good ROM, no contractures. Normal muscle tone.  Skin: no rashes, lesions, ulcers. No induration Neurologic: CN 2-12 grossly intact.  Strength 5/5 in all 4.  Psychiatric: Normal judgment and insight. Alert and oriented x 3. Normal mood.   Labs on Admission: I have personally reviewed following labs and imaging studies  CBC:  Recent Labs Lab 04/05/17 1554 04/07/17 0947 04/10/17 2003   WBC 6.1  --  7.9  NEUTROABS 4,270  --   --   HGB 9.2* 8.9* 9.4*  HCT 28.7*  --  28.0*  MCV 95.7  --  93.0  PLT 171  --  833   Basic Metabolic Panel:  Recent Labs Lab 04/05/17 1554 04/10/17 2003  NA 136 130*  K 3.5 3.6  CL 93*  92*  CO2 28 22  GLUCOSE 77 116*  BUN 107* 138*  CREATININE 6.57* 9.90*  CALCIUM 9.0 8.7*   Liver Function Tests:  Recent Labs Lab 04/10/17 2003  AST 69*  ALT 26  ALKPHOS 69  BILITOT 1.8*  PROT 7.3  ALBUMIN 2.8*    Recent Labs Lab 04/10/17 2046  LIPASE 45   Coagulation Profile:  Recent Labs Lab 04/05/17 1554 04/10/17 2046  INR 3.3* 4.34*   Urine analysis:    Component Value Date/Time   COLORURINE YELLOW 09/26/2016 1939   APPEARANCEUR CLEAR 09/26/2016 1939   LABSPEC 1.005 09/26/2016 1939   PHURINE 5.0 09/26/2016 1939   GLUCOSEU NEGATIVE 09/26/2016 1939   HGBUR MODERATE (A) 09/26/2016 1939   BILIRUBINUR NEGATIVE 09/26/2016 1939   KETONESUR NEGATIVE 09/26/2016 1939   PROTEINUR NEGATIVE 09/26/2016 1939   NITRITE NEGATIVE 09/26/2016 1939   LEUKOCYTESUR NEGATIVE 09/26/2016 1939    Radiological Exams on Admission: Ct Abdomen Pelvis Wo Contrast  Result Date: 04/10/2017 CLINICAL DATA:  Abdominal pain. History of renal cysts, abdominal aortic aneurysm, hiatal hernia, chronic kidney disease. EXAM: CT ABDOMEN AND PELVIS WITHOUT CONTRAST TECHNIQUE: Multidetector CT imaging of the abdomen and pelvis was performed following the standard protocol without IV contrast. COMPARISON:  CT abdomen and pelvis September 26, 2016 FINDINGS: LOWER CHEST: New small to moderate pleural effusions with suspected loculation on the RIGHT. The heart is mildly enlarged. At least mild coronary artery calcifications versus stent. HEPATOBILIARY: Dense liver seen with amiodarone use, slightly nodular contour with atrophy. Normal noncontrast CT gallbladder. PANCREAS: Normal. SPLEEN: Normal. ADRENALS/URINARY TRACT: Kidneys are orthotopic,, mildly atrophic LEFT kidney  kidneys are orthotopic. Bilateral renal cysts measuring to 5.7 cm upper pole RIGHT kidney. 2 mm RIGHT lower pole nephrolithiasis. Bilateral vascular calcifications. Kicked No hydronephrosis; limited assessment for renal masses on this nonenhanced examination. The unopacified ureters are normal in course and caliber. Urinary bladder is partially distended and unremarkable. Stable 18 mm benign LEFT adrenal adenoma (-22 Hounsfield units). STOMACH/BOWEL: Small hiatal hernia. The stomach, small and large bowel are normal in course and caliber without inflammatory changes. Moderate sigmoid diverticulosis. The appendix is not discretely identified, however there are no inflammatory changes in the right lower quadrant. 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 Severe calcific atherosclerosis. Status post fem-fem bypass. REPRODUCTIVE: Normal. OTHER: Moderate volume ascites.  No intraperitoneal free air. MUSCULOSKELETAL: Anasarca. Multiple subcentimeter nodules anterior abdominal wall subcutaneous fat, possible granulomas. Small bilateral fat containing inguinal hernias. Surgical clips RIGHT inguinal soft tissues compatible with prior vascular access. 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  Anasarca. Nonobstructing 2 mm RIGHT lower pole, bilateral renal vascular calcifications. New small to moderate pleural effusions with suspected loculation on the RIGHT. Severe atherosclerosis. Electronically Signed   By: Elon Alas M.D.   On: 04/10/2017 22:47   Dg Chest 2 View  Result Date: 04/10/2017 CLINICAL DATA:  Nausea and lightheadedness for 3 days. History of atrial fibrillation, COPD. EXAM: CHEST  2 VIEW COMPARISON:  Chest radiograph March 28, 2017 inch chest radiograph November 27, 2015 FINDINGS: Cardiac silhouette is moderately enlarged  and unchanged. Mediastinal silhouette is nonsuspicious. Status post median sternotomy for CABG. Calcified aortic knob. Rounded RIGHT lung base pleural density increased from prior CT. Mild chronic bronchitic changes, increased lung volumes with bibasilar scarring. No pneumothorax. Osteopenia. IMPRESSION: Increasing pleural-based suspected loculated pleural effusion though, mass not excluded. Recommend CT chest  with contrast. Stable cardiomegaly and COPD with bibasilar atelectasis/ scarring. Electronically Signed   By: Elon Alas M.D.   On: 04/10/2017 22:33    EKG: T-wave flattening, inferior leads to 3 aVF, V4 V6. Prolonged QRS 168 , prolonged QTC 543 . Nonspecific changes compared to last EKG.  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 Possible uremic gastritis contributing. Last EGD 2014- erosive gastritis, esophageal strictures and erosions. Hgb stable at 9.4, at patients baseline. 40 mg IV Protonix given in ED. 549ml bolus given in ED. - Admit to tele, inpt - CBC Q8H x4 - Hold off on IV fluids for now as patient appears volume overloaded - Vitals every 4 hourly,  - Hemoglobin greater than 8 - Daily INR - Nothing by mouth for now - Give additional 40 mg IV Protonix and start Protonix gtt. - Verbal Consult GI am, pt of Dr. Deatra Ina.  Abnormal chest x-ray- suggest loculated  pleural effusion. CT scan recommended. Patient with a chronic cough productive this a.m. no shortness of breath no leukocytosis and no fevers or chills, no lactic acidosis. - Other CT chest for a.m., following CT findings might need pulmonology consult  - Hold off on antibiotics at this time  Prolonged QTC- 543.Likely Seroquel use, A. Fib, amiodarone was discontinued by cardiologist 11/2016. - EKG  Permanent A. Fib, aortic valve replacement- rate controlled, patient's medication lists amiodarone, and verapamil. On chronic anticoagulation warfarin. INR 4.3. Supra-therapeutic. The cardiology notes 11/22/16- patient's amiodarone was discontinued - Hold warfarin - FFP vitamin K given - Daily INR - Will need to resume anticoagulation as well as GI bleeding resolves, may need bridging with heparin/Lovenox with mechanical aortic valve  CKD5- creatinine markedly elevated at 9.9, rapid increase from 6.57 just 6 days ago. - Will need nephrology consult a.m. - Hold off on diuretics for now- metolazone and torsemide with GI blood loss some better soft blood pressures.  Hypothyroidism- home medications Synthroid 50 g daily - Hold for now, while nothing by mouth  Generalized anxiety disorder- on Seroquel - Hold for now, nothing by mouth  Gout- allopurinol. Stable. - Hold for now, nothing by mouth  CAD- CABG 2010. She denies chest pain. I-stat Troponin minimally elevated at 0.06 in the setting of advanced kidney disease. EKG- prolonged QTC, and specific T wave changes inferiorly leads- to 3 aVF , V3 and V6 - Trend trops X3 - EKG a.m., check QTC.  Type 2 DM- last hemoglobin A1c 03/05/17- 5.1 - monitor.  COPD, OSA- some wheezing on exam today. Has a chronic cough, this a.m. no shortness of breath.  - Scheduled duonebs - Hold off on steroids at this time, no antibiotics pending CT chest results.  Hypertension- blood pressure soft. Hold metolazone and torsemide, verapamil, Imdur   DVT prophylaxis:  Scds  Code Status: Full  Family Communication: None at bedside.  Disposition Plan: To be determined Consults called: None Admission status: Inpt, tele  Bethena Roys MD [facility] Pager [phone]  If 7PM-7AM, please contact night-coverage [email or web address] Password [facility]  04/10/2017, 11:38 PM

## 2017-04-10 NOTE — ED Notes (Signed)
Admitting at the bedside. Bri, NT on the way to the blood bank to pick up first bag of FFP.

## 2017-04-10 NOTE — ED Notes (Signed)
Pt reports he feels as though he has to pee but is unable. Dr. Tamera Punt aware, will wait until after fluid bolus to attempt to obtain urine specimen.

## 2017-04-10 NOTE — ED Notes (Signed)
After returning from CT pt became extremely nauseated. Small amount coffee ground emesis in bag. Son at bedside, MD aware

## 2017-04-10 NOTE — ED Notes (Signed)
Pt stated unable to void at this time. Pt given urinal and call light at bedside.

## 2017-04-10 NOTE — ED Provider Notes (Addendum)
[facility] DEPT Provider Note   CSN: [id number] Arrival date & time: 04/10/17  1945     History   Chief Complaint Chief Complaint  Patient presents with  . Nausea  . Dizziness    HPI Robert Stanton is a 76 y.o. male.  Patient is a 76 year old male with extensive past medical history including hypertension, coronary artery disease, atrial fibrillation on Coumadin, COPD and chronic kidney disease. He presents today with dizziness, fatigue and dark colored stools. He states he's been feeling bad for about 4 weeks since he had an AV fistula placed due to worsening renal function. He states over the last 2-3 days he's had increased fatigue with lightheadedness on sitting or standing. He's also noticed some maroon colored stools. He's had a history of a prior GI bleed about 10 years ago that was resulting from peptic ulcer disease. He does complain of some nausea with some associated upper abdominal pain. He denies any urinary symptoms. He has a little bit of cough which started today. No shortness of breath other than he gets a little short of breath and fatigue on ambulation. He's had chronic leg swelling which has worsened over the last few weeks. His PCP has been adjusting his diuretics. He's tried Bumex and was switched to Lasix. He then had worsening renal function and was switched to Torosemide and Zaroxolyn. He's had little improvement in his fluid overload with these medicines. He has had some recent issues with anemia and was recently given Procrit and iron transfusion within the last week      Past Medical History:  Diagnosis Date  . AAA (abdominal aortic aneurysm) ([place])   . Arthritis   . CAD (coronary artery disease)    a. CABG '00. b. all SVGs occluded 2010. c. low risk Nuc 2012   . Cataracts, bilateral   . Chronic anticoagulation   . CKD (chronic kidney disease), stage III   . COPD (chronic obstructive pulmonary disease) ([place])   . GERD (gastroesophageal reflux  disease)   . Gout   . Heart murmur   . Hiatal hernia   . History of Doppler ultrasound 04/05/2013   LEAs; small distal abd. aoritc aneuysm is stable; fem-pop graft to R leg has 50-69% blockage  . History of echocardiogram 04/17/2013   EF 40-45%; LV hypertrophy; LV mild-mod dilated; AV regurg.; LA severely diated, RA mildly dilated  . History of nuclear stress test 05/14/2011   Dipyridamole; moderate perfusion defect in Basal Infrioer & Mid Inferior region - consistent w/infarct or scar; global LV systolic function mildly reduced; EKG negative for ischemia; low risk scan  . HOH (hard of hearing)   . Hyperlipidemia   . Hypertension   . Hypothyroidism   . Ischemic cardiomyopathy   . Kidney cysts   . Mechanical heart valve present    a. Hx mechanical St Jude AVR 2000.  [name] OSA (obstructive sleep apnea)    wears CPAP  . Peripheral arterial disease ([place])    a. s/p aorto bifem BG;  b. 11/2013 PVA distal R Fem-fem stenosis;  c. 12/2013 PTA of dist R femoral bypass graft.  . Permanent atrial fibrillation ([place])   . Prediabetes   . Ventricular tachycardia (paroxysmal) ([place])    Seen by Dr. Lovena Le with EP, started on amiodarone  . Vitamin B deficiency     Patient Active Problem List   Diagnosis Date Noted  . Chronic kidney disease, stage V ([place]) 02/16/2017  . Dizziness 11/22/2016  . Hearing loss  11/22/2016  . Atherosclerosis of native coronary artery of native heart with unstable angina pectoris ([place]) 11/22/2016  . Chronic obstructive pulmonary disease ([place]) 09/08/2016  . Pre-operative cardiovascular examination 06/02/2016  . Medicare annual wellness visit, subsequent 08/18/2015  . Ventricular tachycardia (paroxysmal) ([place]) 06/11/2015  . Tobacco use disorder 05/15/2015  . Morbid obesity  (BMI 35.57) 05/15/2015  . Poor compliance 02/10/2015  . Hypothyroidism 10/07/2014  . Generalized anxiety disorder 10/03/2014  . Status post mechanical aortic valve replacement 09/02/2014  . Medication  management 02/21/2014  . Vitamin D deficiency 02/21/2014  . Long term current use of anticoagulant therapy 01/16/2014  . Permanent atrial fibrillation ([place]) 12/04/2013  . 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 Peripheral arterial disease ([place]) 12/02/2013  . Kidney cysts   . GERD (gastroesophageal reflux disease)   . Gout   . OSA and COPD overlap syndrome ([place])   . AAA (abdominal aortic aneurysm) ([facility])   . Vitamin B deficiency   . Renal artery stenosis ([place]) 05/01/2013  . PAD,s/p aorto bifem BG, s/p Rt Fem PTA 3/15 04/06/2013  . COLONIC POLYPS 12/26/2007  . Hyperlipidemia 12/26/2007  . Essential hypertension 12/26/2007  . Coronary atherosclerosis 12/26/2007  . ESOPHAGEAL STRICTURE 12/26/2007    Past Surgical History:  Procedure Laterality Date  . AORTIC VALVE REPLACEMENT     St. Jude  . AV FISTULA PLACEMENT Left 03/04/2017   Procedure: ARTERIOVENOUS (AV) FISTULA CREATION LEFT ARM;  Surgeon: Serafina Mitchell, MD;  Location: [facility];  Service: Vascular;  Laterality: Left;  . CARDIAC CATHETERIZATION  11/25/2008   loss of 2/3 (RCA, OM) bypass grafts with patent internal mammary artery to LAD; large collateral filling of RCA ; osteal narrowing of circumflex of ~50%  . CARDIOVERSION  8/282012  . CORONARY ARTERY BYPASS GRAFT  2000   3 vessel; LIMA to LAD; RCA, OM  . FEMORAL BYPASS    . LOWER EXTREMITY ANGIOGRAM N/A 12/03/2013   Procedure: LOWER EXTREMITY ANGIOGRAM;  Surgeon: Lorretta Harp, MD;  Location: [facility] CATH LAB;  Service: Cardiovascular;  Laterality: N/A;  . LOWER EXTREMITY ANGIOGRAM N/A 01/07/2014   Procedure: LOWER EXTREMITY ANGIOGRAM;  Surgeon: Lorretta Harp, MD;  Location: [facility] CATH LAB;  Service: Cardiovascular;  Laterality: N/A;  . shunts in femoral arteries    . stents in kidneys         Home Medications    Prior to Admission medications   Medication Sig Start Date End Date Taking? Authorizing Provider  albuterol (PROVENTIL) (2.5 MG/3ML)  0.083% nebulizer solution Take 3 mLs (2.5 mg total) by nebulization every 4 (four) hours as needed for wheezing or shortness of breath. 11/24/16  Yes Forcucci, Courtney, PA-C  allopurinol (ZYLOPRIM) 300 MG tablet TAKE 1 TABLET BY MOUTH DAILY. TO PREVENT GOUT 02/23/17  Yes Vicie Mutters, PA-C  amiodarone (PACERONE) 200 MG tablet TAKE 1 TABLET (200 MG TOTAL) BY MOUTH DAILY. 02/23/17  Yes Hilty, [name], MD  aspirin EC 81 MG tablet Take 81 mg by mouth daily.   Yes [provider]  Cholecalciferol (VITAMIN D3) 5000 units TABS Take 10,000 Units by mouth daily.   Yes [provider]  clotrimazole-betamethasone (LOTRISONE) cream Apply 1 application topically daily as needed (rash/ skin irritation).   Yes [provider]  fenofibrate (TRICOR) 145 MG tablet TAKE 1 TABLET DAILY FOR BLOOD FATS 01/30/16  Yes Vicie Mutters, PA-C  isosorbide mononitrate (IMDUR) 30 MG 24 hr tablet TAKE 1 TABLET BY MOUTH EVERY  DAY 11/07/16  Yes Unk Pinto, MD  levothyroxine (SYNTHROID, LEVOTHROID) 50 MCG tablet TAKE 1 TABLET BY MOUTH EVERY DAY 11/02/16  Yes Unk Pinto, MD  metolazone (ZAROXOLYN) 5 MG tablet Take 1 tablet daily as directed for fluid/swelling 03/28/17 06/28/17 Yes Unk Pinto, MD  montelukast (SINGULAIR) 10 MG tablet TAKE 1 TABLET (10 MG TOTAL) BY MOUTH DAILY. 11/19/16  Yes Unk Pinto, MD  NITROSTAT 0.4 MG SL tablet PLACE 1 TABLET (0.4 MG TOTAL) UNDER THE TONGUE EVERY 5 (FIVE) MINUTES AS NEEDED FOR CHEST PAIN. 02/11/16  Yes Vicie Mutters, PA-C  pravastatin (PRAVACHOL) 40 MG tablet TAKE 1 TABLET (40 MG TOTAL) BY MOUTH EVERY MORNING. 11/24/16  Yes Vicie Mutters, PA-C  sertraline (ZOLOFT) 50 MG tablet TAKE 1 TABLET (50 MG TOTAL) BY MOUTH DAILY. 10/01/16  Yes Unk Pinto, MD  torsemide (DEMADEX) 100 MG tablet Take 100 mg by mouth daily.   Yes [provider]  verapamil (CALAN) 80 MG tablet TAKE 1 TABLET (80 MG TOTAL) BY MOUTH 2 (TWO) TIMES DAILY. 02/23/17  Yes  Vicie Mutters, PA-C  vitamin B-12 (CYANOCOBALAMIN) 1000 MCG tablet Take 1,000 mcg by mouth daily.   Yes [provider]  warfarin (COUMADIN) 5 MG tablet Take 1 tablet daily or as directed by your doctor. Patient taking differently: Take 2.5 mg by mouth See admin instructions. Take 2.5 mg daily except Monday and Friday. 07/29/16  Yes Unk Pinto, MD    Family History Family History  Problem Relation Age of Onset  . Heart attack Mother   . Heart attack Father   . Lung cancer Sister        x 2  . Kidney cancer Sister   . Kidney disease Sister   . Cancer Brother        ?  . Stroke Brother     Social History Social History  Substance Use Topics  . Smoking status: Current Every Day Smoker    Packs/day: 0.50    Years: 40.00    Types: Cigarettes  . Smokeless tobacco: Never Used  . Alcohol use No     Allergies   Chantix [varenicline]; Lyrica [pregabalin]; Nsaids; Simvastatin; and Ziac [bisoprolol-hydrochlorothiazide]   Review of Systems Review of Systems  Constitutional: Positive for fatigue. Negative for chills, diaphoresis and fever.  HENT: Negative for congestion, rhinorrhea and sneezing.   Eyes: Negative.   Respiratory: Positive for cough and shortness of breath. Negative for chest tightness.   Cardiovascular: Negative for chest pain and leg swelling.  Gastrointestinal: Positive for abdominal pain, blood in stool and nausea. Negative for diarrhea and vomiting.  Genitourinary: Negative for difficulty urinating, flank pain, frequency and hematuria.  Musculoskeletal: Negative for arthralgias and back pain.  Skin: Negative for rash.  Neurological: Positive for weakness (Generalized) and light-headedness. Negative for dizziness, speech difficulty, numbness and headaches.     Physical Exam Updated Vital Signs BP (!) 105/47   Pulse 66   Temp 97.7 F (36.5 C) (Oral)   Resp 20   SpO2 96%   Physical Exam  Constitutional: He is oriented to person, place,  and time. He appears well-developed and well-nourished.  HENT:  Head: Normocephalic and atraumatic.  Eyes: Conjunctivae are normal. Pupils are equal, round, and reactive to light.  Neck: Normal range of motion. Neck supple.  Cardiovascular: Normal rate, regular rhythm and normal heart sounds.   Pulmonary/Chest: Effort normal and breath sounds normal. No respiratory distress. He has no wheezes. He has no rales. He exhibits no tenderness.  Abdominal:  Soft. Bowel sounds are normal. There is tenderness. There is no rebound and no guarding.  Positive tenderness in epigastrium and right upper quadrant  Genitourinary: Rectum normal.  Genitourinary Comments: Positive maroon colored stool on rectal exam  Musculoskeletal: Normal range of motion. He exhibits edema.  3+ pain edema bilaterally  Lymphadenopathy:    He has no cervical adenopathy.  Neurological: He is alert and oriented to person, place, and time.  Skin: Skin is warm and dry. No rash noted.  Psychiatric: He has a normal mood and affect.     ED Treatments / Results  Labs (all labs ordered are listed, but only abnormal results are displayed) Labs Reviewed  COMPREHENSIVE METABOLIC PANEL - Abnormal; Notable for the following:       Result Value   Sodium 130 (*)    Chloride 92 (*)    Glucose, Bld 116 (*)    BUN 138 (*)    Creatinine, Ser 9.90 (*)    Calcium 8.7 (*)    Albumin 2.8 (*)    AST 69 (*)    Total Bilirubin 1.8 (*)    GFR calc non Af Amer 5 (*)    GFR calc Af Amer 5 (*)    Anion gap 16 (*)    All other components within normal limits  CBC - Abnormal; Notable for the following:    RBC 3.01 (*)    Hemoglobin 9.4 (*)    HCT 28.0 (*)    RDW 16.7 (*)    All other components within normal limits  PROTIME-INR - Abnormal; Notable for the following:    Prothrombin Time 42.7 (*)    INR 4.34 (*)    All other components within normal limits  POC OCCULT BLOOD, ED - Abnormal; Notable for the following:    Fecal Occult Bld  POSITIVE (*)    All other components within normal limits  LIPASE, BLOOD  URINALYSIS, ROUTINE W REFLEX MICROSCOPIC  I-STAT TROPOININ, ED  TYPE AND SCREEN  PREPARE FRESH FROZEN PLASMA    EKG  EKG Interpretation  Date/Time:  Sunday April 10 2017 22:58:35 EDT Ventricular Rate:  70 PR Interval:    QRS Duration: 168 QT Interval:  503 QTC Calculation: 543 R Axis:   99 Text Interpretation:  Atrial fibrillation Nonspecific intraventricular conduction delay Minimal ST depression, diffuse leads Confirmed by Malvin Johns [phone]) on 04/10/2017 11:57:33 PM       Radiology Ct Abdomen Pelvis Wo Contrast  Result Date: 04/10/2017 CLINICAL DATA:  Abdominal pain. History of renal cysts, abdominal aortic aneurysm, hiatal hernia, chronic kidney disease. EXAM: CT ABDOMEN AND PELVIS WITHOUT CONTRAST TECHNIQUE: Multidetector CT imaging of the abdomen and pelvis was performed following the standard protocol without IV contrast. COMPARISON:  CT abdomen and pelvis September 26, 2016 FINDINGS: LOWER CHEST: New small to moderate pleural effusions with suspected loculation on the RIGHT. The heart is mildly enlarged. At least mild coronary artery calcifications versus stent. HEPATOBILIARY: Dense liver seen with amiodarone use, slightly nodular contour with atrophy. Normal noncontrast CT gallbladder. PANCREAS: Normal. SPLEEN: Normal. ADRENALS/URINARY TRACT: Kidneys are orthotopic,, mildly atrophic LEFT kidney kidneys are orthotopic. Bilateral renal cysts measuring to 5.7 cm upper pole RIGHT kidney. 2 mm RIGHT lower pole nephrolithiasis. Bilateral vascular calcifications. Kicked No hydronephrosis; limited assessment for renal masses on this nonenhanced examination. The unopacified ureters are normal in course and caliber. Urinary bladder is partially distended and unremarkable. Stable 18 mm benign LEFT adrenal adenoma (-22 Hounsfield units). STOMACH/BOWEL: Small hiatal hernia. The  stomach, small and large bowel are normal  in course and caliber without inflammatory changes. Moderate sigmoid diverticulosis. The appendix is not discretely identified, however there are no inflammatory changes in the right lower quadrant. 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 Severe calcific atherosclerosis. Status post fem-fem bypass. REPRODUCTIVE: Normal. OTHER: Moderate volume ascites.  No intraperitoneal free air. MUSCULOSKELETAL: Anasarca. Multiple subcentimeter nodules anterior abdominal wall subcutaneous fat, possible granulomas. Small bilateral fat containing inguinal hernias. Surgical clips RIGHT inguinal soft tissues compatible with prior vascular access. 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  Anasarca. Nonobstructing 2 mm RIGHT lower pole, bilateral renal vascular calcifications. New small to moderate pleural effusions with suspected loculation on the RIGHT. Severe atherosclerosis. Electronically Signed   By: Elon Alas M.D.   On: 04/10/2017 22:47   Dg Chest 2 View  Result Date: 04/10/2017 CLINICAL DATA:  Nausea and lightheadedness for 3 days. History of atrial fibrillation, COPD. EXAM: CHEST  2 VIEW COMPARISON:  Chest radiograph March 28, 2017 inch chest radiograph November 27, 2015 FINDINGS: Cardiac silhouette is moderately enlarged and unchanged. Mediastinal silhouette is nonsuspicious. Status post median sternotomy for CABG. Calcified aortic knob. Rounded RIGHT lung base pleural density increased from prior CT. Mild chronic bronchitic changes, increased lung volumes with bibasilar scarring. No pneumothorax. Osteopenia. IMPRESSION: Increasing pleural-based suspected loculated pleural effusion though, mass not excluded. Recommend CT chest with contrast. Stable cardiomegaly and COPD with bibasilar atelectasis/ scarring. Electronically Signed    By: Elon Alas M.D.   On: 04/10/2017 22:33    Procedures Procedures (including critical care time)  Medications Ordered in ED Medications  iopamidol (ISOVUE-300) 61 % injection (not administered)  phytonadione (VITAMIN K) 10 mg in dextrose 5 % 50 mL IVPB (not administered)  0.9 %  sodium chloride infusion (not administered)  sodium chloride 0.9 % bolus 500 mL (0 mLs Intravenous Stopped 04/10/17 2245)  pantoprazole (PROTONIX) injection 40 mg (40 mg Intravenous Given 04/10/17 2253)     Initial Impression / Assessment and Plan / ED Course  I have reviewed the triage vital signs and the nursing notes.  Pertinent labs & imaging results that were available during my care of the patient were reviewed by me and considered in my medical decision making (see chart for details).     Patient presents with dizziness and room color stools. His blood pressure is okay. His hemoglobin is low but a little bit better than his last value. He did have an episode of coffee-ground emesis in the ED. Given his elevated INR and bleeding, I started vitamin K and FFP. She does have an artificial heart valve but given the active GI bleed, I felt, in discussion with the hospitalist that he needed rapid reversal of his Coumadin. I spoke with Dr. Carlean Purl with the gastroenterology team  I also spoke with the hospitalist,  Dr. Denton Brick who will admit the patient.  Final Clinical Impressions(s) / ED Diagnoses   Final diagnoses:  Upper GI bleed    New Prescriptions New Prescriptions   No medications on file     Malvin Johns, MD 04/10/17 [id number]    Malvin Johns, MD 04/10/17 2357

## 2017-04-10 NOTE — ED Triage Notes (Signed)
Pt presents to ED for assessment of nausea and light-headedness x 3 days, denies vomiting.  C/o "maroon stools" x 3 days, hx of ulcers with bleeding.  Denies CP, denies SOB.

## 2017-04-10 NOTE — ED Notes (Signed)
Dr. Tamera Punt aware of pt INR.

## 2017-04-11 ENCOUNTER — Encounter (HOSPITAL_COMMUNITY)
Admission: EM | Disposition: A | Discharge status: Hospice | Payer: Self-pay | Source: Home / Self Care | Attending: Internal Medicine

## 2017-04-11 ENCOUNTER — Encounter (HOSPITAL_COMMUNITY): Payer: Self-pay | Admitting: Certified Registered Nurse Anesthetist

## 2017-04-11 ENCOUNTER — Ambulatory Visit (HOSPITAL_COMMUNITY): Admission: RE | Admit: 2017-04-11 | Payer: PPO | Source: Ambulatory Visit

## 2017-04-11 ENCOUNTER — Inpatient Hospital Stay (HOSPITAL_COMMUNITY): Payer: PPO | Admitting: Certified Registered Nurse Anesthetist

## 2017-04-11 ENCOUNTER — Encounter: Payer: PPO | Admitting: Surgery

## 2017-04-11 ENCOUNTER — Inpatient Hospital Stay (HOSPITAL_COMMUNITY): Payer: PPO

## 2017-04-11 DIAGNOSIS — K31811 Angiodysplasia of stomach and duodenum with bleeding: Principal | ICD-10-CM

## 2017-04-11 DIAGNOSIS — I10 Essential (primary) hypertension: Secondary | ICD-10-CM

## 2017-04-11 DIAGNOSIS — K253 Acute gastric ulcer without hemorrhage or perforation: Secondary | ICD-10-CM

## 2017-04-11 DIAGNOSIS — K920 Hematemesis: Secondary | ICD-10-CM

## 2017-04-11 DIAGNOSIS — I5040 Unspecified combined systolic (congestive) and diastolic (congestive) heart failure: Secondary | ICD-10-CM

## 2017-04-11 DIAGNOSIS — D62 Acute posthemorrhagic anemia: Secondary | ICD-10-CM

## 2017-04-11 DIAGNOSIS — D689 Coagulation defect, unspecified: Secondary | ICD-10-CM

## 2017-04-11 DIAGNOSIS — R195 Other fecal abnormalities: Secondary | ICD-10-CM

## 2017-04-11 DIAGNOSIS — R42 Dizziness and giddiness: Secondary | ICD-10-CM

## 2017-04-11 DIAGNOSIS — K222 Esophageal obstruction: Secondary | ICD-10-CM

## 2017-04-11 DIAGNOSIS — K922 Gastrointestinal hemorrhage, unspecified: Secondary | ICD-10-CM | POA: Diagnosis present

## 2017-04-11 HISTORY — PX: ESOPHAGOGASTRODUODENOSCOPY: SHX5428

## 2017-04-11 LAB — CBC
HEMATOCRIT: 25 % — AB (ref 39.0–52.0)
HEMATOCRIT: 24.3 % — AB (ref 39.0–52.0)
HEMOGLOBIN: 8.2 g/dL — AB (ref 13.0–17.0)
Hemoglobin: 8.3 g/dL — ABNORMAL LOW (ref 13.0–17.0)
MCH: 31 pg (ref 26.0–34.0)
MCH: 31.4 pg (ref 26.0–34.0)
MCHC: 33.2 g/dL (ref 30.0–36.0)
MCHC: 33.7 g/dL (ref 30.0–36.0)
MCV: 93.3 fL (ref 78.0–100.0)
MCV: 93.1 fL (ref 78.0–100.0)
Platelets: 181 10*3/uL (ref 150–400)
Platelets: 178 10*3/uL (ref 150–400)
RBC: 2.68 MIL/uL — AB (ref 4.22–5.81)
RBC: 2.61 MIL/uL — ABNORMAL LOW (ref 4.22–5.81)
RDW: 16.6 % — ABNORMAL HIGH (ref 11.5–15.5)
RDW: 16.8 % — ABNORMAL HIGH (ref 11.5–15.5)
WBC: 8 10*3/uL (ref 4.0–10.5)
WBC: 8.2 10*3/uL (ref 4.0–10.5)

## 2017-04-11 LAB — PROTIME-INR
INR: 1.93
Prothrombin Time: 22.3 seconds — ABNORMAL HIGH (ref 11.4–15.2)

## 2017-04-11 LAB — BASIC METABOLIC PANEL
ANION GAP: 18 — AB (ref 5–15)
BUN: 141 mg/dL — ABNORMAL HIGH (ref 6–20)
CO2: 21 mmol/L — AB (ref 22–32)
Calcium: 8.5 mg/dL — ABNORMAL LOW (ref 8.9–10.3)
Chloride: 92 mmol/L — ABNORMAL LOW (ref 101–111)
Creatinine, Ser: 10.23 mg/dL — ABNORMAL HIGH (ref 0.61–1.24)
GFR calc Af Amer: 5 mL/min — ABNORMAL LOW (ref >60)
GFR, EST NON AFRICAN AMERICAN: 4 mL/min — AB (ref >60)
GLUCOSE: 111 mg/dL — AB (ref 65–99)
POTASSIUM: 3.9 mmol/L (ref 3.5–5.1)
Sodium: 131 mmol/L — ABNORMAL LOW (ref 135–145)

## 2017-04-11 LAB — TROPONIN I
Troponin I: 0.03 ng/mL (ref <0.03)
Troponin I: 0.03 ng/mL (ref <0.03)
Troponin I: 0.05 ng/mL (ref <0.03)

## 2017-04-11 SURGERY — EGD (ESOPHAGOGASTRODUODENOSCOPY)
Anesthesia: Monitor Anesthesia Care

## 2017-04-11 MED ORDER — PANTOPRAZOLE SODIUM 40 MG IV SOLR: 40.0000 mg | Freq: Two times a day (BID) | INTRAVENOUS | Status: DC

## 2017-04-11 MED ORDER — SODIUM CHLORIDE 0.9 % IV SOLN
INTRAVENOUS | Status: DC | PRN
  Administered 2017-04-11: 13:00:00 via INTRAVENOUS

## 2017-04-11 MED ORDER — ALBUTEROL SULFATE (2.5 MG/3ML) 0.083% IN NEBU
2.5000 mg | INHALATION_SOLUTION | Freq: Three times a day (TID) | RESPIRATORY_TRACT | Status: DC
  Administered 2017-04-11 (×2): 2.5 mg via RESPIRATORY_TRACT
  Filled 2017-04-11 (×2): qty 3

## 2017-04-11 MED ORDER — ALBUTEROL SULFATE (2.5 MG/3ML) 0.083% IN NEBU
2.5000 mg | INHALATION_SOLUTION | Freq: Three times a day (TID) | RESPIRATORY_TRACT | Status: DC
  Administered 2017-04-12 – 2017-04-21 (×21): 2.5 mg via RESPIRATORY_TRACT
  Filled 2017-04-11 (×26): qty 3

## 2017-04-11 MED ORDER — SODIUM CHLORIDE 0.9 % IV SOLN: 50.0000 ug/h | INTRAVENOUS | Status: DC

## 2017-04-11 MED ORDER — PHENYLEPHRINE 40 MCG/ML (10ML) SYRINGE FOR IV PUSH (FOR BLOOD PRESSURE SUPPORT)
PREFILLED_SYRINGE | INTRAVENOUS | Status: DC | PRN
  Administered 2017-04-11 (×5): 80 ug via INTRAVENOUS
  Administered 2017-04-11: 40 ug via INTRAVENOUS
  Administered 2017-04-11: 80 ug via INTRAVENOUS
  Administered 2017-04-11: 120 ug via INTRAVENOUS
  Administered 2017-04-11 (×2): 80 ug via INTRAVENOUS

## 2017-04-11 MED ORDER — ALBUTEROL SULFATE (2.5 MG/3ML) 0.083% IN NEBU: 2.5000 mg | INHALATION_SOLUTION | RESPIRATORY_TRACT | Status: DC | PRN

## 2017-04-11 MED ORDER — SERTRALINE HCL 50 MG PO TABS: 50.0000 mg | ORAL_TABLET | Freq: Every day | ORAL | Status: DC

## 2017-04-11 MED ORDER — LEVOTHYROXINE SODIUM 50 MCG PO TABS: 50.0000 ug | ORAL_TABLET | Freq: Every day | ORAL | Status: DC

## 2017-04-11 MED ORDER — AMIODARONE HCL 200 MG PO TABS: 200.0000 mg | ORAL_TABLET | Freq: Every day | ORAL | Status: DC

## 2017-04-11 MED ORDER — MONTELUKAST SODIUM 10 MG PO TABS
10.0000 mg | ORAL_TABLET | Freq: Every day | ORAL | Status: DC
  Administered 2017-04-11 – 2017-04-20 (×10): 10 mg via ORAL
  Filled 2017-04-11 (×10): qty 1

## 2017-04-11 MED ORDER — EPHEDRINE SULFATE-NACL 50-0.9 MG/10ML-% IV SOSY
PREFILLED_SYRINGE | INTRAVENOUS | Status: DC | PRN
  Administered 2017-04-11: 5 mg via INTRAVENOUS
  Administered 2017-04-11: 10 mg via INTRAVENOUS
  Administered 2017-04-11: 5 mg via INTRAVENOUS
  Administered 2017-04-11: 10 mg via INTRAVENOUS

## 2017-04-11 MED ORDER — SODIUM CHLORIDE 0.9 % IV SOLN
8.0000 mg/h | INTRAVENOUS | Status: DC
  Administered 2017-04-11 – 2017-04-12 (×3): 8 mg/h via INTRAVENOUS
  Filled 2017-04-11 (×5): qty 80

## 2017-04-11 MED ORDER — PROPOFOL 500 MG/50ML IV EMUL
INTRAVENOUS | Status: DC | PRN
  Administered 2017-04-11: 100 ug/kg/min via INTRAVENOUS

## 2017-04-11 MED ORDER — FUROSEMIDE 10 MG/ML IJ SOLN
160.0000 mg | Freq: Four times a day (QID) | INTRAVENOUS | Status: DC
  Administered 2017-04-11 – 2017-04-13 (×6): 160 mg via INTRAVENOUS
  Filled 2017-04-11 (×2): qty 16
  Filled 2017-04-11: qty 14
  Filled 2017-04-11: qty 10
  Filled 2017-04-11 (×4): qty 16

## 2017-04-11 MED ORDER — PANTOPRAZOLE SODIUM 40 MG IV SOLR
40.0000 mg | Freq: Once | INTRAVENOUS | Status: AC
  Administered 2017-04-11: 40 mg via INTRAVENOUS
  Filled 2017-04-11: qty 40

## 2017-04-11 NOTE — Transfer of Care (Signed)
Immediate Anesthesia Transfer of Care Note  Patient: Robert Stanton  Procedure(s) Performed: Procedure(s): ESOPHAGOGASTRODUODENOSCOPY (EGD) (N/A)  Patient Location: Endoscopy Unit  Anesthesia Type:MAC  Level of Consciousness: awake and alert   Airway & Oxygen Therapy: Patient Spontanous Breathing and Patient connected to nasal cannula oxygen  Post-op Assessment: Report given to RN, Post -op Vital signs reviewed and stable and Patient moving all extremities X 4  Post vital signs: Reviewed and stable  Last Vitals:  Vitals:   04/11/17 1227 04/11/17 1315  BP: (!) 125/47 (!) 85/31  Pulse: 75 70  Resp: 18 17  Temp: 36.6 C     Last Pain:  Vitals:   04/11/17 1227  TempSrc: Oral  PainSc:          Complications: No apparent anesthesia complications

## 2017-04-11 NOTE — Progress Notes (Signed)
PROGRESS NOTE [facility]   SECUNDINO ELLITHORPE   BHA:[id number] DOB: 07-May-1941  DOA: 04/10/2017 PCP: Unk Pinto, MD   Brief Narrative:  Robert Stanton is a 76 y.o. male with medical history significant for- hypertension, coronary artery disease- CABG- 2010,  atrial fibrillation CKD5, hypertension, esophageal strictures, gout, COPD, OSA, mechanical aortic valve, hypothyroidism, peripheral artery disease, type II DM. Patient presented to the emergency department complaining of breathing from his wrist on and also complaining of coffee-ground emesis. Patient was found to have heme-positive stools, increasing creatinine up to 9 from 6 and elevated INR at 4.34. Patient was admitted for evaluation of GI bleed and nephrology evaluation. EGD was performed which showed bleeding gastric AVM status post hemostatic endoscopy with APC, esophageal stricture and moderately large hiatal hernia with some erosions.   Subjective: Patient seen and examined, he has no complaints this morning denies any abdominal pain, bowel movement or bloody emesis. Denies chest pain, palpitations and shortness of breath.  Assessment & Plan: Upper GI bleed Status post EGD with bleeding AVM - which was successfully stop with APC. GI recommendations appreciated, pantoprazole 40 mg indefinitely GI is okay to resume anticoagulation in a.m. 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 Oliguric Nephrology recommendations appreciated, they will try to diuresis. Strict I&Os Likely to need a cath for dialysis. Check BMP in a.m.  Chronic systolic CHF  Repeat ECHO  Diuresis per nephrology  Check daily weights  I&O's, low salt diet   Right loculated pleural effusion No signs of systemic infection at this time, will obtain a CT chest for better images. Hold antibiotics for now Consider pulmonary  consult pending CT images  A. fib, aortic valve replacement  - heart rate well not on heart rate control medications INR was supratherapeutic treated with vitamin K and FFP. Current INR 1.9 Can resume or finding in a.m. if hemoglobin stable Continue telemetry monitoring  Hypertension Medications were on hold as patient was nothing by mouth If tolerating diet okay to resume in the morning  Type 2 diabetes mellitus Last A1c on 03/05/2017 - 5.1 Monitor CBGs   DVT prophylaxis: SCD's Code Status: Full code Family Communication: Family member at bedside  Disposition Plan: Home when medically stable  Consultants:   Nephrology  GI  Procedures:   EGD  Antimicrobials: Anti-infectives    None       Objective: Vitals:   04/11/17 1330 04/11/17 1335 04/11/17 1340 04/11/17 1345  BP:  (!) 105/50    Pulse: 69 73 70 69  Resp: 15 16 17 17   Temp:      TempSrc:      SpO2: 95% 93% 95% 95%  Weight:      Height:        Intake/Output Summary (Last 24 hours) at 04/11/17 1805 Last data filed at 04/11/17 1748  Gross per 24 hour  Intake          2396.84 ml  Output                0 ml  Net          2396.84 ml   Filed Weights   04/11/17 0151 04/11/17 0528  Weight: 109.8 kg (242 lb) 111.9 kg (246 lb 12.8 oz)    Examination:  General exam: Appears calm and comfortable, Hard hearing HEENT: OP moist and clear Respiratory system: Diminished breath sounds bilateral, bibasilar crackles  Cardiovascular system: S1 & S2 heard, RRR. Systolic murmur and click best here at RSB Gastrointestinal system: Abdomen is nondistended, soft and nontender. No organomegaly or masses felt. Normal bowel sounds heard.  Central nervous system: Alert and oriented. No focal neurological deficits. Extremities: 2+ lower extremity edema, left upper extremity aVF  Skin: No rashes, lesions or ulcers Psychiatry: Judgement and insight appear normal. Mood & affect appropriate.    Data Reviewed: I have  personally reviewed following labs and imaging studies  CBC:  Recent Labs Lab 04/05/17 1554 04/07/17 0947 04/10/17 2003 04/11/17 0911  WBC 6.1  --  7.9 8.0  NEUTROABS 4,270  --   --   --   HGB 9.2* 8.9* 9.4* 8.3*  HCT 28.7*  --  28.0* 25.0*  MCV 95.7  --  93.0 93.3  PLT 171  --  176 836   Basic Metabolic Panel:  Recent Labs Lab 04/05/17 1554 04/10/17 2003 04/11/17 0911  NA 136 130* 131*  K 3.5 3.6 3.9  CL 93* 92* 92*  CO2 28 22 21*  GLUCOSE 77 116* 111*  BUN 107* 138* 141*  CREATININE 6.57* 9.90* 10.23*  CALCIUM 9.0 8.7* 8.5*   GFR: Estimated Creatinine Clearance: 7.7 mL/min (A) (by C-G formula based on SCr of 10.23 mg/dL (H)). Liver Function Tests:  Recent Labs Lab 04/10/17 2003  AST 69*  ALT 26  ALKPHOS 69  BILITOT 1.8*  PROT 7.3  ALBUMIN 2.8*    Recent Labs Lab 04/10/17 2046  LIPASE 45   No results for input(s): AMMONIA in the last 168 hours. Coagulation Profile:  Recent Labs Lab 04/05/17 1554 04/10/17 2046 04/11/17 0646  INR 3.3* 4.34* 1.93   Cardiac Enzymes:  Recent Labs Lab 04/11/17 0216 04/11/17 0646 04/11/17 1425  TROPONINI 0.03* 0.03* 0.05*   BNP (last 3 results) No results for input(s): PROBNP in the last 8760 hours. HbA1C: No results for input(s): HGBA1C in the last 72 hours. CBG: No results for input(s): GLUCAP in the last 168 hours. Lipid Profile: No results for input(s): CHOL, HDL, LDLCALC, TRIG, CHOLHDL, LDLDIRECT in the last 72 hours. Thyroid Function Tests: No results for input(s): TSH, T4TOTAL, FREET4, T3FREE, THYROIDAB in the last 72 hours. Anemia Panel: No results for input(s): VITAMINB12, FOLATE, FERRITIN, TIBC, IRON, RETICCTPCT in the last 72 hours. Sepsis Labs: No results for input(s): PROCALCITON, LATICACIDVEN in the last 168 hours.  No results found for this or any previous visit (from the past 240 hour(s)).       Radiology Studies: Ct Abdomen Pelvis Wo Contrast  Result Date: 04/10/2017 CLINICAL  DATA:  Abdominal pain. History of renal cysts, abdominal aortic aneurysm, hiatal hernia, chronic kidney disease. EXAM: CT ABDOMEN AND PELVIS WITHOUT CONTRAST TECHNIQUE: Multidetector CT imaging of the abdomen and pelvis was performed following the standard protocol without IV contrast. COMPARISON:  CT abdomen and pelvis September 26, 2016 FINDINGS: LOWER CHEST: New small to moderate pleural effusions with suspected loculation on the RIGHT. The heart is mildly enlarged. At least mild coronary artery calcifications versus stent. HEPATOBILIARY: Dense liver seen with amiodarone use, slightly nodular contour with atrophy. Normal noncontrast CT gallbladder. PANCREAS: Normal. SPLEEN: Normal. ADRENALS/URINARY TRACT: Kidneys are orthotopic,, mildly atrophic LEFT kidney kidneys are orthotopic. Bilateral renal cysts measuring to 5.7 cm upper pole RIGHT kidney. 2 mm RIGHT lower pole nephrolithiasis. Bilateral vascular calcifications. Kicked No hydronephrosis; limited assessment for renal masses on this nonenhanced examination. The unopacified ureters are normal in course and caliber. Urinary bladder is partially distended  and unremarkable. Stable 18 mm benign LEFT adrenal adenoma (-22 Hounsfield units). STOMACH/BOWEL: Small hiatal hernia. The stomach, small and large bowel are normal in course and caliber without inflammatory changes. Moderate sigmoid diverticulosis. The appendix is not discretely identified, however there are no inflammatory changes in the right lower quadrant. 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 Severe calcific atherosclerosis. Status post fem-fem bypass. REPRODUCTIVE: Normal. OTHER: Moderate volume ascites.  No intraperitoneal free air. MUSCULOSKELETAL: Anasarca. Multiple subcentimeter nodules anterior abdominal wall subcutaneous fat, possible granulomas. Small bilateral fat containing inguinal hernias. Surgical clips RIGHT inguinal soft  tissues compatible with prior vascular access. 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  Anasarca. Nonobstructing 2 mm RIGHT lower pole, bilateral renal vascular calcifications. New small to moderate pleural effusions with suspected loculation on the RIGHT. Severe atherosclerosis. Electronically Signed   By: Elon Alas M.D.   On: 04/10/2017 22:47   Dg Chest 2 View  Result Date: 04/10/2017 CLINICAL DATA:  Nausea and lightheadedness for 3 days. History of atrial fibrillation, COPD. EXAM: CHEST  2 VIEW COMPARISON:  Chest radiograph March 28, 2017 inch chest radiograph November 27, 2015 FINDINGS: Cardiac silhouette is moderately enlarged and unchanged. Mediastinal silhouette is nonsuspicious. Status post median sternotomy for CABG. Calcified aortic knob. Rounded RIGHT lung base pleural density increased from prior CT. Mild chronic bronchitic changes, increased lung volumes with bibasilar scarring. No pneumothorax. Osteopenia. IMPRESSION: Increasing pleural-based suspected loculated pleural effusion though, mass not excluded. Recommend CT chest with contrast. Stable cardiomegaly and COPD with bibasilar atelectasis/ scarring. Electronically Signed   By: Elon Alas M.D.   On: 04/10/2017 22:33      Scheduled Meds: . albuterol  2.5 mg Nebulization Q8H  . montelukast  10 mg Oral QHS  . [START ON 04/14/2017] pantoprazole  40 mg Intravenous Q12H   Continuous Infusions: . furosemide 160 mg (04/11/17 1741)  . pantoprozole (PROTONIX) infusion 8 mg/hr (04/11/17 1739)     LOS: 1 day    Time spent: Total of 25 minutes spent with pt, greater than 50% of which was spent in discussion of  treatment, counseling and coordination of care    Chipper Oman, MD Pager: Text Page via [email or web address]  [phone]  If 7PM-7AM, please contact night-coverage [email or web address] Password  [facility] 04/11/2017, 6:05 PM

## 2017-04-11 NOTE — Anesthesia Preprocedure Evaluation (Addendum)
Anesthesia Evaluation  Patient identified by MRN, date of birth, ID band Patient confused    Reviewed: Allergy & Precautions, NPO status , Patient's Chart, lab work & pertinent test results  Airway Mallampati: III  TM Distance: >3 FB Neck ROM: Full    Dental  (+) Edentulous Upper, Edentulous Lower   Pulmonary sleep apnea and Continuous Positive Airway Pressure Ventilation , COPD (controlled), Current Smoker,    Pulmonary exam normal breath sounds clear to auscultation       Cardiovascular hypertension, Pt. on medications + angina (stable, last episode 3 months ago) with exertion + CAD, + Cardiac Stents (2001), + CABG (2000) and +CHF  Normal cardiovascular exam+ dysrhythmias Atrial Fibrillation and Supra Ventricular Tachycardia  Rhythm:Regular Rate:Normal  ECG: a-fib, rate 57 ECHO: EF 40-45%; LV hypertrophy; LV mild-mod dilated; AV regurg.; LA severely diated, RA mildly dilated Sees Dr. [name], Cardiology.   Neuro/Psych negative neurological ROS  negative psych ROS   GI/Hepatic Neg liver ROS, GERD  ,  Endo/Other  Hypothyroidism   Renal/GU ESRFRenal disease  negative genitourinary   Musculoskeletal negative musculoskeletal ROS (+)   Abdominal   Peds negative pediatric ROS (+)  Hematology  (+) anemia ,   Anesthesia Other Findings Hyperlipidemia Obese Ambulates with cane GI bleed  Reproductive/Obstetrics negative OB ROS                            Anesthesia Physical  Anesthesia Plan  ASA: IV  Anesthesia Plan: MAC   Post-op Pain Management:    Induction: Intravenous  PONV Risk Score and Plan:   Airway Management Planned: Nasal Cannula  Additional Equipment:   Intra-op Plan:   Post-operative Plan:   Informed Consent: I have reviewed the patients History and Physical, chart, labs and discussed the procedure including the risks, benefits and alternatives for the proposed  anesthesia with the patient or authorized representative who has indicated his/her understanding and acceptance.   Dental advisory given  Plan Discussed with: CRNA and Surgeon  Anesthesia Plan Comments:         Anesthesia Quick Evaluation

## 2017-04-11 NOTE — ED Notes (Signed)
Pt verbalized understanding of risks and benefits of receiving FFP. Consent form signed and by the bedside. Witnessed by this nurse.

## 2017-04-11 NOTE — Anesthesia Procedure Notes (Signed)
Procedure Name: MAC Date/Time: 04/11/2017 12:40 PM Performed by: Garrison Columbus T Pre-anesthesia Checklist: Patient identified, Emergency Drugs available, Suction available and Patient being monitored Patient Re-evaluated:Patient Re-evaluated prior to inductionOxygen Delivery Method: Nasal cannula Preoxygenation: Pre-oxygenation with 100% oxygen Intubation Type: IV induction Placement Confirmation: positive ETCO2 and breath sounds checked- equal and bilateral Dental Injury: Teeth and Oropharynx as per pre-operative assessment

## 2017-04-11 NOTE — Progress Notes (Signed)
Patient arrived from the emergency room, via stretcher, hard of hearing, IV x 2 in right arm, 1st unit of FFP infusing.  Placed in bed, oriented to room, call bell within reach.  Patient has multiple bruising and skin tear to right arm and abrasion on bilateral knees and left shoulder.  INR elevated and will receive 4 units of FFP, patient also has bloody emesis and black stool.  Gastro consult in the a.m.  Will continue to monitor.  Wilson Singer, RN

## 2017-04-11 NOTE — Consult Note (Signed)
[facility] Consult: 10:03 AM 04/11/2017  LOS: 1 day    Referring Provider: Dr Patrecia Pour  Primary Care Physician:  Unk Pinto, MD Primary Gastroenterologist:  Dr. Deatra Ina, no GI interactions since 03/2013.    Son :  Doristine Devoid historian and lives with pt: Lorrie Strauch [phone].     Reason for Consultation:  Bloody emesis and black stool.  Normocytic anemia.     HPI: Robert Stanton is a 76 y.o. male.  PMH HTN.  A fib. CAD: 2000 CABG and mechanical St Jude AVR.  Cardiac stent in 2001. On Coumadin.  LVEF 45% on 05/2016 Echo.  OSA, on CPAP.  CKD 3-4.  Kidney stones. Renal artery stenosis. ASPVD, s/p aorto-bifem with subsequent stent to right graft, right fem-fem.  Hepatomegaly noted on previou CT scans.  Anemia and GI blood loss most recently suspected due to [facility] erosions 07/2002 Colonoscopy for hematochezia, Dr Deatra Ina: diverticulosis descending/sigmoid with ? Severe spasm vs fixed stricture.      07/2005 EGD for melena, hematemsis, Dr Deatra Ina: erosive gastritis.   07/2007 Colonoscopy, for hematochezia.  Left sided tics.  6 bands placed onto non-bleeding internal hemorrhoids.  5 mm sessile sigmoid polyp (Path: fecal material)  10/2012 EGD for, Dr Deatra Ina.  Esophageal stricture with local erosions.  Erosions in large HH.   Hemorrhagic gastritis.  Path: benign inflammation, no H Pylori.  S/p left UE AV fistula placement for impending ESRD and need for HD.  Since then several setbacks including harder to regulate INR.  Just hasn't felt well. He's developed increased lower extremity edema, anorexia. In the last 7-10 days he's had nausea but no vomiting at home. He's had dizzy spells. He's fallen twice.  The first on 6/11 when he bent over to pick up keys he had dropped on the floor and fell forward causing some  trauma to his left side. His second fall was on the 6/21 when he got up from his bed and his legs just gave out from under him.   Despite tweaking of diuretics the lower extremity edema is progressive but not painful. For 3 days prior to arrival his stools were burgundy/maroon in color. He felt dizzier and finally came to the ED. After CT scan abdomen pelvis he vomited coffee ground material. coags 42.7/4.3, corrected to 22/1.9.   Hgbs at 9.2 - 9.4, c/w baseline of 9.2 - 10.3.  MCV in 90s.  Stool FOBT +. S/p FFP x 4.   Troponins 0.03 x 2.   Hyponatremic at 130.   T bili 1.8, alk phos 45, AST/ALT 69/26.     + AKI.   Abdominal/pelvic CT, non-contrast: Anasarca, anterior abdominal wall nodules (likely granulomas). Small, bil, fat-containing inguinal hernias.  Early cirrhosis suspected.  Bil small to moderate pleural effusions, loculation on right.  Severe atherosclerosis.  CXR with loculated right pleural effusion.   Last week the patient underwent first treatment with Procrit and parenteral iron infusion per renal, Dr. Posey Pronto.  He had not had any bloody or dark-looking stools/emesis for many years, since 2014. He  denies dysphagia. He takes 20 mg Pepcid daily and a baby aspirin but not taking any NSAIDs overall he's probably gained about 20 pounds.        Past Medical History:  Diagnosis Date  . AAA (abdominal aortic aneurysm) ([place])   . Arthritis   . CAD (coronary artery disease)    a. CABG '00. b. all SVGs occluded 2010. c. low risk Nuc 2012   . Cataracts, bilateral   . Chronic anticoagulation   . CKD (chronic kidney disease), stage III   . COPD (chronic obstructive pulmonary disease) ([place])   . GERD (gastroesophageal reflux disease)   . Gout   . Heart murmur   . Hiatal hernia   . History of Doppler ultrasound 04/05/2013   LEAs; small distal abd. aoritc aneuysm is stable; fem-pop graft to R leg has 50-69% blockage  . History of echocardiogram 04/17/2013   EF 40-45%; LV hypertrophy; LV  mild-mod dilated; AV regurg.; LA severely diated, RA mildly dilated  . History of nuclear stress test 05/14/2011   Dipyridamole; moderate perfusion defect in Basal Infrioer & Mid Inferior region - consistent w/infarct or scar; global LV systolic function mildly reduced; EKG negative for ischemia; low risk scan  . HOH (hard of hearing)   . Hyperlipidemia   . Hypertension   . Hypothyroidism   . Ischemic cardiomyopathy   . Kidney cysts   . Mechanical heart valve present    a. Hx mechanical St Jude AVR 2000.  [name] OSA (obstructive sleep apnea)    wears CPAP  . Peripheral arterial disease ([place])    a. s/p aorto bifem BG;  b. 11/2013 PVA distal R Fem-fem stenosis;  c. 12/2013 PTA of dist R femoral bypass graft.  . Permanent atrial fibrillation ([place])   . Prediabetes   . Ventricular tachycardia (paroxysmal) ([place])    Seen by Dr. Lovena Le with EP, started on amiodarone  . Vitamin B deficiency     Past Surgical History:  Procedure Laterality Date  . AORTIC VALVE REPLACEMENT     St. Jude  . AV FISTULA PLACEMENT Left 03/04/2017   Procedure: ARTERIOVENOUS (AV) FISTULA CREATION LEFT ARM;  Surgeon: Serafina Mitchell, MD;  Location: [place];  Service: Vascular;  Laterality: Left;  . CARDIAC CATHETERIZATION  11/25/2008   loss of 2/3 (RCA, OM) bypass grafts with patent internal mammary artery to LAD; large collateral filling of RCA ; osteal narrowing of circumflex of ~50%  . CARDIOVERSION  8/282012  . CORONARY ARTERY BYPASS GRAFT  2000   3 vessel; LIMA to LAD; RCA, OM  . FEMORAL BYPASS    . LOWER EXTREMITY ANGIOGRAM N/A 12/03/2013   Procedure: LOWER EXTREMITY ANGIOGRAM;  Surgeon: Lorretta Harp, MD;  Location: [facility] CATH LAB;  Service: Cardiovascular;  Laterality: N/A;  . LOWER EXTREMITY ANGIOGRAM N/A 01/07/2014   Procedure: LOWER EXTREMITY ANGIOGRAM;  Surgeon: Lorretta Harp, MD;  Location: [facility] CATH LAB;  Service: Cardiovascular;  Laterality: N/A;  . shunts in femoral arteries    . stents in kidneys       Prior to Admission medications   Medication Sig Start Date End Date Taking? Authorizing Provider  albuterol (PROVENTIL) (2.5 MG/3ML) 0.083% nebulizer solution Take 3 mLs (2.5 mg total) by nebulization every 4 (four) hours as needed for wheezing or shortness of breath. 11/24/16  Yes Forcucci, Courtney, PA-C  allopurinol (ZYLOPRIM) 300 MG tablet TAKE 1 TABLET BY MOUTH DAILY. TO PREVENT GOUT 02/23/17  Yes Vicie Mutters, PA-C  amiodarone (PACERONE) 200  MG tablet TAKE 1 TABLET (200 MG TOTAL) BY MOUTH DAILY. 02/23/17  Yes Hilty, [name], MD  aspirin EC 81 MG tablet Take 81 mg by mouth daily.   Yes [provider]  Cholecalciferol (VITAMIN D3) 5000 units TABS Take 10,000 Units by mouth daily.   Yes [provider]  clotrimazole-betamethasone (LOTRISONE) cream Apply 1 application topically daily as needed (rash/ skin irritation).   Yes [provider]  fenofibrate (TRICOR) 145 MG tablet TAKE 1 TABLET DAILY FOR BLOOD FATS 01/30/16  Yes Vicie Mutters, PA-C  isosorbide mononitrate (IMDUR) 30 MG 24 hr tablet TAKE 1 TABLET BY MOUTH EVERY DAY 11/07/16  Yes Unk Pinto, MD  levothyroxine (SYNTHROID, LEVOTHROID) 50 MCG tablet TAKE 1 TABLET BY MOUTH EVERY DAY 11/02/16  Yes Unk Pinto, MD  metolazone (ZAROXOLYN) 5 MG tablet Take 1 tablet daily as directed for fluid/swelling 03/28/17 06/28/17 Yes Unk Pinto, MD  montelukast (SINGULAIR) 10 MG tablet TAKE 1 TABLET (10 MG TOTAL) BY MOUTH DAILY. 11/19/16  Yes Unk Pinto, MD  NITROSTAT 0.4 MG SL tablet PLACE 1 TABLET (0.4 MG TOTAL) UNDER THE TONGUE EVERY 5 (FIVE) MINUTES AS NEEDED FOR CHEST PAIN. 02/11/16  Yes Vicie Mutters, PA-C  pravastatin (PRAVACHOL) 40 MG tablet TAKE 1 TABLET (40 MG TOTAL) BY MOUTH EVERY MORNING. 11/24/16  Yes Vicie Mutters, PA-C  sertraline (ZOLOFT) 50 MG tablet TAKE 1 TABLET (50 MG TOTAL) BY MOUTH DAILY. 10/01/16  Yes Unk Pinto, MD  torsemide (DEMADEX) 100 MG tablet Take 100 mg by mouth daily.    Yes [provider]  verapamil (CALAN) 80 MG tablet TAKE 1 TABLET (80 MG TOTAL) BY MOUTH 2 (TWO) TIMES DAILY. 02/23/17  Yes Vicie Mutters, PA-C  vitamin B-12 (CYANOCOBALAMIN) 1000 MCG tablet Take 1,000 mcg by mouth daily.   Yes [provider]  warfarin (COUMADIN) 5 MG tablet Take 1 tablet daily or as directed by your doctor. Patient taking differently: Take 2.5 mg by mouth See admin instructions. Take 2.5 mg daily except Monday and Friday. 07/29/16  Yes Unk Pinto, MD    Scheduled Meds: . albuterol  2.5 mg Nebulization Q8H  . montelukast  10 mg Oral QHS  . [START ON 04/14/2017] pantoprazole  40 mg Intravenous Q12H   Infusions: . pantoprozole (PROTONIX) infusion 8 mg/hr (04/11/17 0221)   PRN Meds:    Allergies as of 04/10/2017 - Review Complete 04/10/2017  Allergen Reaction Noted  . Chantix [varenicline] Nausea And Vomiting 09/08/2013  . Lyrica [pregabalin] Other (See Comments) 10/02/2013  . Nsaids Other (See Comments) 04/26/2013  . Simvastatin Other (See Comments) 09/21/2012  . Ziac [bisoprolol-hydrochlorothiazide] Other (See Comments) 09/08/2013    Family History  Problem Relation Age of Onset  . Heart attack Mother   . Heart attack Father   . Lung cancer Sister        x 2  . Kidney cancer Sister   . Kidney disease Sister   . Cancer Brother        ?  . Stroke Brother     Social History   Social History  . Marital status: Married    Spouse name: N/A  . Number of children: 3  . Years of education: N/A   Occupational History  . Not on file.   Social History Main Topics  . Smoking status: Current Every Day Smoker    Packs/day: 0.50    Years: 40.00    Types: Cigarettes  . Smokeless tobacco: Never Used  . Alcohol use No  .  Drug use: No  . Sexual activity: Not on file   Other Topics Concern  . Not on file   Social History Narrative   Lives in [place] with disabled wife, three sons, and grandson   Retired    REVIEW OF  SYSTEMS: Constitutional:  Weakness. ENT:  No nose bleeds.  HOH. Pulm:   shortness of breath, mostly nonproductive cough. CV:  No palpitations, no LE edema.  no chest pain  GU:  No hematuria, no frequency GI:  Per HPI Heme:  Per HPI.  He does bruise quite easily and has developed significant purpura on the arms after recent falls.   Transfusions:  FFP x 4.   Neuro:  No headaches, no peripheral tingling or numbness.  No syncope.  Derm:  No itching, no rash.  + Skin tears from recent falls.   Endocrine:  No sweats or chills.  No polyuria or dysuria Immunization:  Did not inquire as to recent or previous vaccination history. Travel:  None beyond local counties in last few months.    PHYSICAL EXAM: Vital signs in last 24 hours: Vitals:   04/11/17 0543 04/11/17 0639  BP: (!) 104/49 (!) 104/47  Pulse: 69 68  Resp: 20 18  Temp: 97.9 F (36.6 C) 98.6 F (37 C)   Wt Readings from Last 3 Encounters:  04/11/17 111.9 kg (246 lb 12.8 oz)  04/07/17 109.8 kg (242 lb)  04/05/17 110.4 kg (243 lb 6.4 oz)    Gen:  Obese, chronically ill, elderly, comfortable WM Head:   no signs of head trauma. No facial asymmetry or edema.  Eyes:   no scleral icterus, no conjunctival pallor. Ears:   hard of hearing, no hearing aids in place  Nose:   no congestion or discharge. Mouth:   full set of dentures in place. Mucosa is pink, moist, clear. Neck:   no thyromegaly, no JVD, no masses. Lungs:   some mild expiratory wheezing, fine rales in the left base, diminished breath sounds on right.  Slight increase WOB.   Heart: Normal sinus rhythm on telemetry monitor. Valve click. Abdomen:  Soft. Obese. Not tender or distended. No organomegaly, masses, bruits, do not appreciate inguinal hernias..   Rectal: Deferred.  FOBT positive per ED physician.   Musc/Skeltl: No gross joint deformities, redness. Extremities:  Pitting edema up into the thighs bilaterally.  Neurologic:  Patient is hard of hearing. He is alert.  He is oriented times 3. He is not a great historian. Fortunately his son was able to give me a very good history.  Moves all 4 limbs. No tremor. Limb strength not tested. Skin:  Extensive purpura on the lower arms bilaterally. Bruising on the back. Tattoos:  None seen Nodes:  No cervical adenopathy.   Psych:  Calm, pleasant, cooperative.  Intake/Output from previous day: 06/24 0701 - 06/25 0700 In: 1852.9 [I.V.:108.3; Blood:1244.6; IV Piggyback:500] Out: -  Intake/Output this shift: No intake/output data recorded.  LAB RESULTS:  Recent Labs  04/10/17 2003  WBC 7.9  HGB 9.4*  HCT 28.0*  PLT 176   BMET Lab Results  Component Value Date   NA 130 (L) 04/10/2017   NA 136 04/05/2017   NA 135 03/28/2017   K 3.6 04/10/2017   K 3.5 04/05/2017   K 4.3 03/28/2017   CL 92 (L) 04/10/2017   CL 93 (L) 04/05/2017   CL 96 (L) 03/28/2017   CO2 22 04/10/2017   CO2 28 04/05/2017   CO2 25 03/28/2017  GLUCOSE 116 (H) 04/10/2017   GLUCOSE 77 04/05/2017   GLUCOSE 91 03/28/2017   BUN 138 (H) 04/10/2017   BUN 107 (H) 04/05/2017   BUN 73 (H) 03/28/2017   CREATININE 9.90 (H) 04/10/2017   CREATININE 6.57 (H) 04/05/2017   CREATININE 4.79 (H) 03/28/2017   CALCIUM 8.7 (L) 04/10/2017   CALCIUM 9.0 04/05/2017   CALCIUM 9.3 03/28/2017   LFT  Recent Labs  04/10/17 2003  PROT 7.3  ALBUMIN 2.8*  AST 69*  ALT 26  ALKPHOS 69  BILITOT 1.8*   PT/INR Lab Results  Component Value Date   INR 1.93 04/11/2017   INR 4.34 (HH) 04/10/2017   INR 3.3 (H) 04/05/2017   Hepatitis Panel No results for input(s): HEPBSAG, HCVAB, HEPAIGM, HEPBIGM in the last 72 hours. C-Diff No components found for: CDIFF Lipase     Component Value Date/Time   LIPASE 45 04/10/2017 2046    Drugs of Abuse  No results found for: LABOPIA, COCAINSCRNUR, LABBENZ, AMPHETMU, THCU, LABBARB   RADIOLOGY STUDIES: Ct Abdomen Pelvis Wo Contrast  Result Date: 04/10/2017 CLINICAL DATA:  Abdominal pain. History of renal  cysts, abdominal aortic aneurysm, hiatal hernia, chronic kidney disease. EXAM: CT ABDOMEN AND PELVIS WITHOUT CONTRAST TECHNIQUE: Multidetector CT imaging of the abdomen and pelvis was performed following the standard protocol without IV contrast. COMPARISON:  CT abdomen and pelvis September 26, 2016 FINDINGS: LOWER CHEST: New small to moderate pleural effusions with suspected loculation on the RIGHT. The heart is mildly enlarged. At least mild coronary artery calcifications versus stent. HEPATOBILIARY: Dense liver seen with amiodarone use, slightly nodular contour with atrophy. Normal noncontrast CT gallbladder. PANCREAS: Normal. SPLEEN: Normal. ADRENALS/URINARY TRACT: Kidneys are orthotopic,, mildly atrophic LEFT kidney kidneys are orthotopic. Bilateral renal cysts measuring to 5.7 cm upper pole RIGHT kidney. 2 mm RIGHT lower pole nephrolithiasis. Bilateral vascular calcifications. Kicked No hydronephrosis; limited assessment for renal masses on this nonenhanced examination. The unopacified ureters are normal in course and caliber. Urinary bladder is partially distended and unremarkable. Stable 18 mm benign LEFT adrenal adenoma (-22 Hounsfield units). STOMACH/BOWEL: Small hiatal hernia. The stomach, small and large bowel are normal in course and caliber without inflammatory changes. Moderate sigmoid diverticulosis. The appendix is not discretely identified, however there are no inflammatory changes in the right lower quadrant. 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 Severe calcific atherosclerosis. Status post fem-fem bypass. REPRODUCTIVE: Normal. OTHER: Moderate volume ascites.  No intraperitoneal free air. MUSCULOSKELETAL: Anasarca. Multiple subcentimeter nodules anterior abdominal wall subcutaneous fat, possible granulomas. Small bilateral fat containing inguinal hernias. Surgical clips RIGHT inguinal soft tissues compatible with prior vascular  access. 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  Anasarca. Nonobstructing 2 mm RIGHT lower pole, bilateral renal vascular calcifications. New small to moderate pleural effusions with suspected loculation on the RIGHT. Severe atherosclerosis. Electronically Signed   By: Elon Alas M.D.   On: 04/10/2017 22:47   Dg Chest 2 View  Result Date: 04/10/2017 CLINICAL DATA:  Nausea and lightheadedness for 3 days. History of atrial fibrillation, COPD. EXAM: CHEST  2 VIEW COMPARISON:  Chest radiograph March 28, 2017 inch chest radiograph November 27, 2015 FINDINGS: Cardiac silhouette is moderately enlarged and unchanged. Mediastinal silhouette is nonsuspicious. Status post median sternotomy for CABG. Calcified aortic knob. Rounded RIGHT lung base pleural density increased from prior CT. Mild chronic bronchitic changes, increased lung volumes with bibasilar scarring. No  pneumothorax. Osteopenia. IMPRESSION: Increasing pleural-based suspected loculated pleural effusion though, mass not excluded. Recommend CT chest with contrast. Stable cardiomegaly and COPD with bibasilar atelectasis/ scarring. Electronically Signed   By: Elon Alas M.D.   On: 04/10/2017 22:33     IMPRESSION:   *  Cg emesis and maroon stools.  Suspect PUD or bleeding from [facility] lesions. Acid control medication is suboptimal with low-dose famotidine at home. Has been started on Protonix drip. Large HH, gastritis, esophageal stricture with erosions on previous EGDs.  Hemorrhoids (s/p banding), tics on previous colonoscopy  *  Normocytic anemia.  Hgb stable.  No PRBCs to date.    *  Chronic Coumadin for AVR and a fib.  INR mildly suprtherapeutic, corrected with FFP.   *  ? Cirrhosis per CT scan.  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   *  AKI, baseline stage  3-4 CKD.     PLAN:     *  Trying to arrange EGD for today, if unable to schedule with [facility] today, will scope tomorrow.    *  For now will leave the Protonix drip in place, though it may be unnecessary. Wait until the EGD findings to decide whether or not we can stop the Protonix drip.   Azucena Freed  04/11/2017, 10:03 AM Pager: [phone]  GI ATTENDING  History, laboratories, x-rays, prior endoscopy reports reviewed. Patient personally seen and examined. Agree with comprehensive consultation note as outlined above. Extremely complicated 76 year old with multiple medical problems as listed above. Amongst other things, presents with hemodynamically stable minor upper GI bleed. On chronic anticoagulation. Coagulopathy has improved after treatment, though still mildly coagulopathic. Plan urgent EGD today with MAC. The patient is high risk.The nature of the procedure, as well as the risks, benefits, and alternatives were carefully and thoroughly reviewed with the patient. Ample time for discussion and questions allowed. The patient understood, was satisfied, and agreed to proceed. Recommend PPI.  Docia Chuck. Geri Seminole., M.D. [facility] of Gastroenterology

## 2017-04-11 NOTE — ED Notes (Signed)
Attempted report x1. 

## 2017-04-11 NOTE — Progress Notes (Signed)
CRITICAL VALUE ALERT  Critical Value:  Troponin 0.03  Date & Time Notied:  04/11/17 @ 0300  Provider Notified: Azell Der  Orders Received/Actions taken: text sent, no orders

## 2017-04-11 NOTE — Consult Note (Signed)
76 y/o man with CKD cr 2.74 on 03/02/17 and 3.0 on 12/31/16 followed by Dr. Posey Pronto s/p LUE AVF 03/04/17 presenting to [facility] with weakness, dizziness and burgundy stools and coffee ground emesis in the ED.  On 03/28/17 Creat was 4.79, 6/19 6.57, 6/24 9.9 and 6/25 10.73m/dl.  Hgb was 9.4 on 6/21. We are asked to see due to AKI on CKD.  He c/o nausea.  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  CT chest revealed sm to mod Pl effusion with ? loculation on right and ascites, and evidence of calcified blood vessels.  There is known renal artery stenosis R>L by duplex scan 2014.  I don't see any UOP recorded.  Past Medical History:  Diagnosis Date  . AAA (abdominal aortic aneurysm) ([place]   . Arthritis   . CAD (coronary artery disease)    a. CABG '00. b. all SVGs occluded 2010. c. low risk Nuc 2012   . Cataracts, bilateral   . Chronic anticoagulation   . CKD (chronic kidney disease), stage III   . COPD (chronic obstructive pulmonary disease) ([place]   . GERD (gastroesophageal reflux disease)   . Gout   . Heart murmur   . Hiatal hernia   . History of Doppler ultrasound 04/05/2013   LEAs; small distal abd. aoritc aneuysm is stable; fem-pop graft to R leg has 50-69% blockage  . History of echocardiogram 04/17/2013   EF 40-45%; LV hypertrophy; LV mild-mod dilated; AV regurg.; LA severely diated, RA mildly dilated  . History of nuclear stress test 05/14/2011   Dipyridamole; moderate perfusion defect in Basal Infrioer & Mid Inferior region - consistent w/infarct or scar; global LV systolic function mildly reduced; EKG negative for ischemia; low risk scan  . HOH (hard of hearing)   . Hyperlipidemia   . Hypertension   . Hypothyroidism   . Ischemic cardiomyopathy   . Kidney cysts   . Mechanical heart valve present    a. Hx mechanical St Jude AVR 2000.  .Marland KitchenOSA (obstructive sleep apnea)    wears CPAP  . Peripheral arterial disease ([place]    a. s/p aorto bifem BG;  b. 11/2013 PVA  distal R Fem-fem stenosis;  c. 12/2013 PTA of dist R femoral bypass graft.  . Permanent atrial fibrillation ([place]   . Prediabetes   . Ventricular tachycardia (paroxysmal) ([place]    Seen by Dr. TLovena Lewith EP, started on amiodarone  . Vitamin B deficiency    Past Surgical History:  Procedure Laterality Date  . AORTIC VALVE REPLACEMENT     St. Jude  . AV FISTULA PLACEMENT Left 03/04/2017   Procedure: ARTERIOVENOUS (AV) FISTULA CREATION LEFT ARM;  Surgeon: BSerafina Mitchell MD;  [facility]  Service: Vascular;  Laterality: Left;  . CARDIAC CATHETERIZATION  11/25/2008   loss of 2/3 (RCA, OM) bypass grafts with patent internal mammary artery to LAD; large collateral filling of RCA ; osteal narrowing of circumflex of ~50%  . CARDIOVERSION  8/282012  . CORONARY ARTERY BYPASS GRAFT  2000   3 vessel; LIMA to LAD; RCA, OM  . ESOPHAGOGASTRODUODENOSCOPY N/A 04/11/2017   Procedure: ESOPHAGOGASTRODUODENOSCOPY (EGD);  Surgeon: PIrene Shipper MD;  [facility];  Service: Endoscopy;  Laterality: N/A;  . FEMORAL BYPASS    . LOWER EXTREMITY ANGIOGRAM N/A 12/03/2013   Procedure: LOWER EXTREMITY ANGIOGRAM;  Surgeon: JLorretta Harp MD;  [facility];  Service: Cardiovascular;  Laterality: N/A;  . LOWER EXTREMITY ANGIOGRAM  N/A 01/07/2014   Procedure: LOWER EXTREMITY ANGIOGRAM;  Surgeon: Lorretta Harp, MD;  [facility];  Service: Cardiovascular;  Laterality: N/A;  . shunts in femoral arteries    . stents in kidneys     Social History:  reports that he has been smoking Cigarettes.  He has a 20.00 pack-year smoking history. He has never used smokeless tobacco. He reports that he does not drink alcohol or use drugs. Allergies:  Allergies  Allergen Reactions  . Chantix [Varenicline] Nausea And Vomiting  . Lyrica [Pregabalin] Other (See Comments)    Swelling, couldn't breathe  . Nsaids Other (See Comments)    Cannot take while on coumadin  . Simvastatin Other (See Comments)     Pt said he had a "bleed out" after taking it  . Ziac [Bisoprolol-Hydrochlorothiazide] Other (See Comments)    fatigue   Family History  Problem Relation Age of Onset  . Heart attack Mother   . Heart attack Father   . Lung cancer Sister        x 2  . Kidney cancer Sister   . Kidney disease Sister   . Cancer Brother        ?  . Stroke Brother     Medications:  Prior to Admission:  Prescriptions Prior to Admission  Medication Sig Dispense Refill Last Dose  . albuterol (PROVENTIL) (2.5 MG/3ML) 0.083% nebulizer solution Take 3 mLs (2.5 mg total) by nebulization every 4 (four) hours as needed for wheezing or shortness of breath. 30 vial 0 UNK  . allopurinol (ZYLOPRIM) 300 MG tablet TAKE 1 TABLET BY MOUTH DAILY. TO PREVENT GOUT 90 tablet 1 04/10/2017 at Unknown time  . amiodarone (PACERONE) 200 MG tablet TAKE 1 TABLET (200 MG TOTAL) BY MOUTH DAILY. 30 tablet 3 04/10/2017 at Unknown time  . aspirin EC 81 MG tablet Take 81 mg by mouth daily.   04/10/2017 at 0730  . Cholecalciferol (VITAMIN D3) 5000 units TABS Take 10,000 Units by mouth daily.   04/10/2017 at Unknown time  . clotrimazole-betamethasone (LOTRISONE) cream Apply 1 application topically daily as needed (rash/ skin irritation).   UNK  . fenofibrate (TRICOR) 145 MG tablet TAKE 1 TABLET DAILY FOR BLOOD FATS 90 tablet 3 04/10/2017 at Unknown time  . isosorbide mononitrate (IMDUR) 30 MG 24 hr tablet TAKE 1 TABLET BY MOUTH EVERY DAY 90 tablet 1 04/10/2017 at Unknown time  . levothyroxine (SYNTHROID, LEVOTHROID) 50 MCG tablet TAKE 1 TABLET BY MOUTH EVERY DAY 90 tablet 1 04/10/2017 at Unknown time  . metolazone (ZAROXOLYN) 5 MG tablet Take 1 tablet daily as directed for fluid/swelling 30 tablet 2 04/10/2017 at Unknown time  . montelukast (SINGULAIR) 10 MG tablet TAKE 1 TABLET (10 MG TOTAL) BY MOUTH DAILY. 90 tablet 1 04/10/2017 at Unknown time  . NITROSTAT 0.4 MG SL tablet PLACE 1 TABLET (0.4 MG TOTAL) UNDER THE TONGUE EVERY 5 (FIVE) MINUTES AS  NEEDED FOR CHEST PAIN. 25 tablet 5 UNK  . pravastatin (PRAVACHOL) 40 MG tablet TAKE 1 TABLET (40 MG TOTAL) BY MOUTH EVERY MORNING. 90 tablet 1 04/10/2017 at Unknown time  . sertraline (ZOLOFT) 50 MG tablet TAKE 1 TABLET (50 MG TOTAL) BY MOUTH DAILY. 90 tablet 1 04/10/2017 at Unknown time  . torsemide (DEMADEX) 100 MG tablet Take 100 mg by mouth daily.   04/10/2017 at Unknown time  . verapamil (CALAN) 80 MG tablet TAKE 1 TABLET (80 MG TOTAL) BY MOUTH 2 (TWO) TIMES DAILY. 180 tablet 1 04/10/2017 at  Unknown time  . vitamin B-12 (CYANOCOBALAMIN) 1000 MCG tablet Take 1,000 mcg by mouth daily.   04/10/2017 at Unknown time  . warfarin (COUMADIN) 5 MG tablet Take 1 tablet daily or as directed by your doctor. (Patient taking differently: Take 2.5 mg by mouth See admin instructions. Take 2.5 mg daily except Monday and Friday.) 90 tablet 3 04/09/2017 at 1900   Scheduled: . albuterol  2.5 mg Nebulization Q8H  . montelukast  10 mg Oral QHS  . [START ON 04/14/2017] pantoprazole  40 mg Intravenous Q12H   ROS:  As per HPI otherwisww noncontrib Blood pressure (!) 105/50, pulse 69, temperature 97.7 F (36.5 C), temperature source Oral, resp. rate 17, height '5\' 8"'  (1.727 m), weight 111.9 kg (246 lb 12.8 oz), SpO2 95 %.  General appearance: alert and cooperative Head: Normocephalic, without obvious abnormality, atraumatic Eyes: negative Ears: normal TM's and external ear canals both ears Nose: Nares normal. Septum midline. Mucosa normal. No drainage or sinus tenderness. Throat: lips, mucosa, and tongue normal; teeth and gums normal Resp: diminished breath sounds bibasilar and bilaterally Chest wall: no tenderness Cardio: regular rate and rhythm, S1, S2 normal, no murmur, click, rub or gallop GI: soft, non-tender; bowel sounds normal; no masses,  no organomegaly and sl distended Extremities: edema 2+ bilat, LUE AVF small Skin: Skin color, texture, turgor normal. No rashes or lesions Neurologic: Grossly  normal Results for orders placed or performed during the hospital encounter of 04/10/17 (from the past 48 hour(s))  Comprehensive metabolic panel     Status: Abnormal   Collection Time: 04/10/17  8:03 PM  Result Value Ref Range   Sodium 130 (L) 135 - 145 mmol/L   Potassium 3.6 3.5 - 5.1 mmol/L   Chloride 92 (L) 101 - 111 mmol/L   CO2 22 22 - 32 mmol/L   Glucose, Bld 116 (H) 65 - 99 mg/dL   BUN 138 (H) 6 - 20 mg/dL   Creatinine, Ser 9.90 (H) 0.61 - 1.24 mg/dL   Calcium 8.7 (L) 8.9 - 10.3 mg/dL   Total Protein 7.3 6.5 - 8.1 g/dL   Albumin 2.8 (L) 3.5 - 5.0 g/dL   AST 69 (H) 15 - 41 U/L   ALT 26 17 - 63 U/L   Alkaline Phosphatase 69 38 - 126 U/L   Total Bilirubin 1.8 (H) 0.3 - 1.2 mg/dL   GFR calc non Af Amer 5 (L) >60 mL/min   GFR calc Af Amer 5 (L) >60 mL/min    Comment: (NOTE) The eGFR has been calculated using the CKD EPI equation. This calculation has not been validated in all clinical situations. eGFR's persistently <60 mL/min signify possible Chronic Kidney Disease.    Anion gap 16 (H) 5 - 15  CBC     Status: Abnormal   Collection Time: 04/10/17  8:03 PM  Result Value Ref Range   WBC 7.9 4.0 - 10.5 K/uL   RBC 3.01 (L) 4.22 - 5.81 MIL/uL   Hemoglobin 9.4 (L) 13.0 - 17.0 g/dL   HCT 28.0 (L) 39.0 - 52.0 %   MCV 93.0 78.0 - 100.0 fL   MCH 31.2 26.0 - 34.0 pg   MCHC 33.6 30.0 - 36.0 g/dL   RDW 16.7 (H) 11.5 - 15.5 %   Platelets 176 150 - 400 K/uL  Type and screen [place]     Status: None   Collection Time: 04/10/17  8:05 PM  Result Value Ref Range   ABO/RH(D) A POS  Antibody Screen NEG    Sample Expiration 04/13/2017   Lipase, blood     Status: None   Collection Time: 04/10/17  8:46 PM  Result Value Ref Range   Lipase 45 11 - 51 U/L  Protime-INR     Status: Abnormal   Collection Time: 04/10/17  8:46 PM  Result Value Ref Range   Prothrombin Time 42.7 (H) 11.4 - 15.2 seconds   INR 4.34 (HH)     Comment: REPEATED TO VERIFY CRITICAL RESULT  CALLED TO, READ BACK BY AND VERIFIED WITH: C.BROWN,RN 2126 04/10/17 M.CAMPBELL   I-stat troponin, ED     Status: None   Collection Time: 04/10/17  8:51 PM  Result Value Ref Range   Troponin i, poc 0.06 0.00 - 0.08 ng/mL   Comment 3            Comment: Due to the release kinetics of cTnI, a negative result within the first hours of the onset of symptoms does not rule out myocardial infarction with certainty. If myocardial infarction is still suspected, repeat the test at appropriate intervals.   POC occult blood, ED     Status: Abnormal   Collection Time: 04/10/17  8:57 PM  Result Value Ref Range   Fecal Occult Bld POSITIVE (A) NEGATIVE  Prepare fresh frozen plasma     Status: None (Preliminary result)   Collection Time: 04/10/17 11:17 PM  Result Value Ref Range   Unit Number [id number]    Blood Component Type THAWED PLASMA    Unit division 00    Status of Unit ISSUED,FINAL    Transfusion Status OK TO TRANSFUSE    Unit Number [id number]    Blood Component Type THWPLS APHR1    Unit division 00    Status of Unit ISSUED    Transfusion Status OK TO TRANSFUSE    Unit Number [id number]    Blood Component Type THWPLS APHR1    Unit division 00    Status of Unit ISSUED    Transfusion Status OK TO TRANSFUSE    Unit Number [id number]    Blood Component Type THW PLS APHR    Unit division 00    Status of Unit ISSUED    Transfusion Status OK TO TRANSFUSE   Troponin I (q 6hr x 3)     Status: Abnormal   Collection Time: 04/11/17  2:16 AM  Result Value Ref Range   Troponin I 0.03 (HH) <0.03 ng/mL    Comment: CRITICAL RESULT CALLED TO, READ BACK BY AND VERIFIED WITH: TROGDON M,RN 04/11/17 0300 WAYK   Troponin I (q 6hr x 3)     Status: Abnormal   Collection Time: 04/11/17  6:46 AM  Result Value Ref Range   Troponin I 0.03 (HH) <0.03 ng/mL    Comment: CRITICAL VALUE NOTED.  VALUE IS CONSISTENT WITH PREVIOUSLY REPORTED AND CALLED VALUE.  Protime-INR     Status: Abnormal    Collection Time: 04/11/17  6:46 AM  Result Value Ref Range   Prothrombin Time 22.3 (H) 11.4 - 15.2 seconds   INR 1.93   CBC     Status: Abnormal   Collection Time: 04/11/17  9:11 AM  Result Value Ref Range   WBC 8.0 4.0 - 10.5 K/uL   RBC 2.68 (L) 4.22 - 5.81 MIL/uL   Hemoglobin 8.3 (L) 13.0 - 17.0 g/dL   HCT 25.0 (L) 39.0 - 52.0 %   MCV 93.3 78.0 - 100.0 fL   MCH 31.0 26.0 - 34.0  pg   MCHC 33.2 30.0 - 36.0 g/dL   RDW 16.6 (H) 11.5 - 15.5 %   Platelets 181 150 - 400 K/uL  Basic metabolic panel     Status: Abnormal   Collection Time: 04/11/17  9:11 AM  Result Value Ref Range   Sodium 131 (L) 135 - 145 mmol/L   Potassium 3.9 3.5 - 5.1 mmol/L   Chloride 92 (L) 101 - 111 mmol/L   CO2 21 (L) 22 - 32 mmol/L   Glucose, Bld 111 (H) 65 - 99 mg/dL   BUN 141 (H) 6 - 20 mg/dL   Creatinine, Ser 10.23 (H) 0.61 - 1.24 mg/dL   Calcium 8.5 (L) 8.9 - 10.3 mg/dL   GFR calc non Af Amer 4 (L) >60 mL/min   GFR calc Af Amer 5 (L) >60 mL/min    Comment: (NOTE) The eGFR has been calculated using the CKD EPI equation. This calculation has not been validated in all clinical situations. eGFR's persistently <60 mL/min signify possible Chronic Kidney Disease.    Anion gap 18 (H) 5 - 15  Troponin I (q 6hr x 3)     Status: Abnormal   Collection Time: 04/11/17  2:25 PM  Result Value Ref Range   Troponin I 0.05 (HH) <0.03 ng/mL    Comment: CRITICAL VALUE NOTED.  VALUE IS CONSISTENT WITH PREVIOUSLY REPORTED AND CALLED VALUE.   Ct Abdomen Pelvis Wo Contrast  Result Date: 04/10/2017 CLINICAL DATA:  Abdominal pain. History of renal cysts, abdominal aortic aneurysm, hiatal hernia, chronic kidney disease. EXAM: CT ABDOMEN AND PELVIS WITHOUT CONTRAST TECHNIQUE: Multidetector CT imaging of the abdomen and pelvis was performed following the standard protocol without IV contrast. COMPARISON:  CT abdomen and pelvis September 26, 2016 FINDINGS: LOWER CHEST: New small to moderate pleural effusions with suspected  loculation on the RIGHT. The heart is mildly enlarged. At least mild coronary artery calcifications versus stent. HEPATOBILIARY: Dense liver seen with amiodarone use, slightly nodular contour with atrophy. Normal noncontrast CT gallbladder. PANCREAS: Normal. SPLEEN: Normal. ADRENALS/URINARY TRACT: Kidneys are orthotopic,, mildly atrophic LEFT kidney kidneys are orthotopic. Bilateral renal cysts measuring to 5.7 cm upper pole RIGHT kidney. 2 mm RIGHT lower pole nephrolithiasis. Bilateral vascular calcifications. Kicked No hydronephrosis; limited assessment for renal masses on this nonenhanced examination. The unopacified ureters are normal in course and caliber. Urinary bladder is partially distended and unremarkable. Stable 18 mm benign LEFT adrenal adenoma (-22 Hounsfield units). STOMACH/BOWEL: Small hiatal hernia. The stomach, small and large bowel are normal in course and caliber without inflammatory changes. Moderate sigmoid diverticulosis. The appendix is not discretely identified, however there are no inflammatory changes in the right lower quadrant. 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 Severe calcific atherosclerosis. Status post fem-fem bypass. REPRODUCTIVE: Normal. OTHER: Moderate volume ascites.  No intraperitoneal free air. MUSCULOSKELETAL: Anasarca. Multiple subcentimeter nodules anterior abdominal wall subcutaneous fat, possible granulomas. Small bilateral fat containing inguinal hernias. Surgical clips RIGHT inguinal soft tissues compatible with prior vascular access. 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  Anasarca. Nonobstructing 2 mm RIGHT lower pole, bilateral renal vascular calcifications. New small to moderate pleural effusions with suspected loculation on the RIGHT. Severe atherosclerosis. Electronically  Signed   By: Elon Alas M.D.   On: 04/10/2017 22:47   Dg Chest 2 View  Result Date: 04/10/2017 CLINICAL DATA:  Nausea and lightheadedness for 3 days. History of  atrial fibrillation, COPD. EXAM: CHEST  2 VIEW COMPARISON:  Chest radiograph March 28, 2017 inch chest radiograph November 27, 2015 FINDINGS: Cardiac silhouette is moderately enlarged and unchanged. Mediastinal silhouette is nonsuspicious. Status post median sternotomy for CABG. Calcified aortic knob. Rounded RIGHT lung base pleural density increased from prior CT. Mild chronic bronchitic changes, increased lung volumes with bibasilar scarring. No pneumothorax. Osteopenia. IMPRESSION: Increasing pleural-based suspected loculated pleural effusion though, mass not excluded. Recommend CT chest with contrast. Stable cardiomegaly and COPD with bibasilar atelectasis/ scarring. Electronically Signed   By: Elon Alas M.D.   On: 04/10/2017 22:33    Assessment:  1 Oliguric AKI hemodynamically mediated due to GI blood loss 2 CKD 4 3 s/p AVF on LUE 03/04/17 4 Volume overload with pl effusions, ascites and edema 4 ABLA due to GI loss 5 Gastric AVMs s/p tx Plan: 1 Diff situation but will try diuretics 2 Prob will need dialysis cath and dialysis  Adeel Guiffre C 04/11/2017, 4:25 PM

## 2017-04-11 NOTE — Op Note (Signed)
[facility] Patient Name: Robert Stanton Procedure Date : 04/11/2017 MRN: [id number] Attending MD: Docia Chuck. Henrene Pastor , MD Date of Birth: [birth year] CSN: [id number] Age: 76 Admit Type: Inpatient Procedure:                Upper GI endoscopy, with control of bleeding Indications:              Coffee-ground emesis, Heme positive stool, Melena Providers:                Docia Chuck. Henrene Pastor, MD, Cleda Daub, RN, Elspeth Cho Tech., Technician Referring MD:             [facility] Medicines:                Monitored Anesthesia Care Complications:            No immediate complications. Estimated Blood Loss:     Estimated blood loss: none. Procedure:                Pre-Anesthesia Assessment:                           - Prior to the procedure, a History and Physical                            was performed, and patient medications and                            allergies were reviewed. The patient's tolerance of                            previous anesthesia was also reviewed. The risks                            and benefits of the procedure and the sedation                            options and risks were discussed with the patient.                            All questions were answered, and informed consent                            was obtained. Prior Anticoagulants: The patient has                            taken Coumadin (warfarin), last dose was 1 day                            prior to procedure. ASA Grade Assessment: III - A                            patient with severe systemic disease. After  reviewing the risks and benefits, the patient was                            deemed in satisfactory condition to undergo the                            procedure.                           After obtaining informed consent, the endoscope was                            passed under direct vision. Throughout the                procedure, the patient's blood pressure, pulse, and                            oxygen saturations were monitored continuously. The                            EG-[id number] ([id number]) scope was introduced through the                            mouth, and advanced to the second part of duodenum.                            The upper GI endoscopy was accomplished without                            difficulty. The patient tolerated the procedure                            well. Scope In: Scope Out: Findings:      One mild benign-appearing, intrinsic stenosis was found at the       gastroesophageal junction. This measured 1.5 cm (inner diameter) and was       traversed. No associated esophagitis.      A 6 cm hiatal hernia was present. There are several Cameron erosions       with hematin present in.      A blood clot was present in the gastric antrum. This was lavaged away       and underlying actively oozing angiodysplastic lesion was found in the       gastric antrum. Coagulation for hemostasis using argon beam was       successful.      The examined stomach and duodenum were normal. Impression:               1. Bleeding gastric AVM status post successful                            hemostatic endoscopic therapy with APC                           2. Esophageal stricture without esophagitis  3. Moderately large hiatal hernia with Cameron                            erosions                           4. Otherwise unremarkable EGD. Moderate Sedation:      none Recommendation:           1. Recommend pantoprazole 40 mg daily indefinitely                           2. Full liquid diet. Advance as tolerated                           3. Okay to resume anticoagulation from GI                            standpoint if clinically important.                           4. Monitor blood counts and stools Procedure Code(s):        --- Professional ---                            [phone], Esophagogastroduodenoscopy, flexible,                            transoral; with control of bleeding, any method Diagnosis Code(s):        --- Professional ---                           K22.2, Esophageal obstruction                           K44.9, Diaphragmatic hernia without obstruction or                            gangrene                           K31.811, Angiodysplasia of stomach and duodenum                            with bleeding                           K92.0, Hematemesis                           R19.5, Other fecal abnormalities                           K92.1, Melena (includes Hematochezia) CPT copyright 2016 [employer] Association. All rights reserved. The codes documented in this report are preliminary and upon coder review may  be revised to meet current compliance requirements. Docia Chuck. Henrene Pastor, MD 04/11/2017 1:18:54 PM This report has been signed electronically. Number of Addenda: 0

## 2017-04-11 NOTE — Anesthesia Postprocedure Evaluation (Signed)
Anesthesia Post Note  Patient: Robert Stanton  Procedure(s) Performed: Procedure(s) (LRB): ESOPHAGOGASTRODUODENOSCOPY (EGD) (N/A)     Patient location during evaluation: PACU Anesthesia Type: MAC Level of consciousness: awake and alert Pain management: pain level controlled Vital Signs Assessment: post-procedure vital signs reviewed and stable Respiratory status: spontaneous breathing, nonlabored ventilation, respiratory function stable and patient connected to nasal cannula oxygen Cardiovascular status: stable and blood pressure returned to baseline Anesthetic complications: no    Last Vitals:  Vitals:   04/11/17 1340 04/11/17 1345  BP:    Pulse: 70 69  Resp: 17 17  Temp:      Last Pain:  Vitals:   04/11/17 1315  TempSrc: Oral  PainSc:                  Kendrell Lottman P Alacia Rehmann

## 2017-04-12 ENCOUNTER — Inpatient Hospital Stay (HOSPITAL_COMMUNITY): Payer: PPO

## 2017-04-12 ENCOUNTER — Encounter (HOSPITAL_COMMUNITY): Payer: Self-pay | Admitting: Interventional Radiology

## 2017-04-12 ENCOUNTER — Other Ambulatory Visit (HOSPITAL_COMMUNITY): Payer: PPO

## 2017-04-12 DIAGNOSIS — Z7901 Long term (current) use of anticoagulants: Secondary | ICD-10-CM

## 2017-04-12 DIAGNOSIS — I5021 Acute systolic (congestive) heart failure: Secondary | ICD-10-CM

## 2017-04-12 DIAGNOSIS — E1122 Type 2 diabetes mellitus with diabetic chronic kidney disease: Secondary | ICD-10-CM

## 2017-04-12 DIAGNOSIS — N184 Chronic kidney disease, stage 4 (severe): Secondary | ICD-10-CM

## 2017-04-12 DIAGNOSIS — R34 Anuria and oliguria: Secondary | ICD-10-CM

## 2017-04-12 DIAGNOSIS — E039 Hypothyroidism, unspecified: Secondary | ICD-10-CM

## 2017-04-12 DIAGNOSIS — I129 Hypertensive chronic kidney disease with stage 1 through stage 4 chronic kidney disease, or unspecified chronic kidney disease: Secondary | ICD-10-CM

## 2017-04-12 HISTORY — PX: IR FLUORO GUIDE CV LINE RIGHT: IMG2283

## 2017-04-12 HISTORY — PX: IR US GUIDE VASC ACCESS RIGHT: IMG2390

## 2017-04-12 LAB — RENAL FUNCTION PANEL
Albumin: 2.9 g/dL — ABNORMAL LOW (ref 3.5–5.0)
Anion gap: 18 — ABNORMAL HIGH (ref 5–15)
BUN: 144 mg/dL — AB (ref 6–20)
CALCIUM: 8.4 mg/dL — AB (ref 8.9–10.3)
CHLORIDE: 93 mmol/L — AB (ref 101–111)
CO2: 21 mmol/L — AB (ref 22–32)
CREATININE: 10.95 mg/dL — AB (ref 0.61–1.24)
GFR calc Af Amer: 5 mL/min — ABNORMAL LOW (ref >60)
GFR, EST NON AFRICAN AMERICAN: 4 mL/min — AB (ref >60)
Glucose, Bld: 129 mg/dL — ABNORMAL HIGH (ref 65–99)
Phosphorus: 7 mg/dL — ABNORMAL HIGH (ref 2.5–4.6)
Potassium: 4 mmol/L (ref 3.5–5.1)
SODIUM: 132 mmol/L — AB (ref 135–145)

## 2017-04-12 LAB — CBC
HCT: 25.2 % — ABNORMAL LOW (ref 39.0–52.0)
HCT: 26.1 % — ABNORMAL LOW (ref 39.0–52.0)
HEMOGLOBIN: 8.3 g/dL — AB (ref 13.0–17.0)
Hemoglobin: 8.5 g/dL — ABNORMAL LOW (ref 13.0–17.0)
MCH: 30.6 pg (ref 26.0–34.0)
MCH: 30.5 pg (ref 26.0–34.0)
MCHC: 32.9 g/dL (ref 30.0–36.0)
MCHC: 32.6 g/dL (ref 30.0–36.0)
MCV: 93 fL (ref 78.0–100.0)
MCV: 93.5 fL (ref 78.0–100.0)
PLATELETS: 169 10*3/uL (ref 150–400)
PLATELETS: 185 10*3/uL (ref 150–400)
RBC: 2.71 MIL/uL — ABNORMAL LOW (ref 4.22–5.81)
RBC: 2.79 MIL/uL — ABNORMAL LOW (ref 4.22–5.81)
RDW: 16.8 % — ABNORMAL HIGH (ref 11.5–15.5)
RDW: 17 % — AB (ref 11.5–15.5)
WBC: 8 10*3/uL (ref 4.0–10.5)
WBC: 9.6 10*3/uL (ref 4.0–10.5)

## 2017-04-12 LAB — BPAM FFP
BLOOD PRODUCT EXPIRATION DATE: 201806272359
BLOOD PRODUCT EXPIRATION DATE: 201806272359
BLOOD PRODUCT EXPIRATION DATE: 201806272359
Blood Product Expiration Date: 201806272359
ISSUE DATE / TIME: 201806250252
ISSUE DATE / TIME: 201806242345
ISSUE DATE / TIME: 201806250252
ISSUE DATE / TIME: 201806250252
UNIT TYPE AND RH: 600
UNIT TYPE AND RH: 6200
Unit Type and Rh: 6200
Unit Type and Rh: 6200

## 2017-04-12 LAB — PROTIME-INR
INR: 1.54
PROTHROMBIN TIME: 18.6 s — AB (ref 11.4–15.2)

## 2017-04-12 LAB — PREPARE FRESH FROZEN PLASMA
UNIT DIVISION: 0
Unit division: 0
Unit division: 0
Unit division: 0

## 2017-04-12 MED ORDER — LIDOCAINE HCL (PF) 1 % IJ SOLN
INTRAMUSCULAR | Status: AC
  Filled 2017-04-12: qty 30

## 2017-04-12 MED ORDER — CEFAZOLIN SODIUM-DEXTROSE 2-4 GM/100ML-% IV SOLN
2.0000 g | INTRAVENOUS | Status: AC
  Administered 2017-04-12: 2 g via INTRAVENOUS

## 2017-04-12 MED ORDER — FENTANYL CITRATE (PF) 100 MCG/2ML IJ SOLN
INTRAMUSCULAR | Status: AC
  Filled 2017-04-12: qty 2

## 2017-04-12 MED ORDER — IPRATROPIUM-ALBUTEROL 0.5-2.5 (3) MG/3ML IN SOLN
3.0000 mL | RESPIRATORY_TRACT | Status: DC | PRN
  Administered 2017-04-14 – 2017-04-16 (×2): 3 mL via RESPIRATORY_TRACT
  Filled 2017-04-12 (×2): qty 3

## 2017-04-12 MED ORDER — PANTOPRAZOLE SODIUM 40 MG PO TBEC
40.0000 mg | DELAYED_RELEASE_TABLET | Freq: Every day | ORAL | Status: DC
  Administered 2017-04-13 – 2017-04-17 (×5): 40 mg via ORAL
  Filled 2017-04-12 (×6): qty 1

## 2017-04-12 MED ORDER — GELATIN ABSORBABLE 12-7 MM EX MISC
CUTANEOUS | Status: AC
  Filled 2017-04-12: qty 1

## 2017-04-12 MED ORDER — LEVOTHYROXINE SODIUM 100 MCG IV SOLR
25.0000 ug | Freq: Every day | INTRAVENOUS | Status: DC
  Administered 2017-04-12 – 2017-04-13 (×2): 25 ug via INTRAVENOUS
  Filled 2017-04-12 (×2): qty 5

## 2017-04-12 MED ORDER — CEFAZOLIN SODIUM-DEXTROSE 2-4 GM/100ML-% IV SOLN
INTRAVENOUS | Status: AC
  Filled 2017-04-12: qty 100

## 2017-04-12 MED ORDER — FENTANYL CITRATE (PF) 100 MCG/2ML IJ SOLN
INTRAMUSCULAR | Status: AC | PRN
  Administered 2017-04-12: 12.5 ug via INTRAVENOUS

## 2017-04-12 MED ORDER — HEPARIN SODIUM (PORCINE) 1000 UNIT/ML IJ SOLN
INTRAMUSCULAR | Status: AC
  Filled 2017-04-12: qty 1

## 2017-04-12 MED ORDER — LIDOCAINE HCL 1 % IJ SOLN
INTRAMUSCULAR | Status: AC | PRN
  Administered 2017-04-12: 5 mL

## 2017-04-12 MED ORDER — GELATIN ABSORBABLE 12-7 MM EX MISC
CUTANEOUS | Status: AC | PRN
  Administered 2017-04-12: 1 via TOPICAL

## 2017-04-12 MED ORDER — MIDAZOLAM HCL 2 MG/2ML IJ SOLN
INTRAMUSCULAR | Status: AC
  Filled 2017-04-12: qty 2

## 2017-04-12 MED ORDER — MIDAZOLAM HCL 2 MG/2ML IJ SOLN
INTRAMUSCULAR | Status: AC | PRN
  Administered 2017-04-12: 0.5 mg via INTRAVENOUS

## 2017-04-12 NOTE — Progress Notes (Signed)
PROGRESS NOTE    Robert Stanton  JYN:[id number] DOB: 1941/08/16 DOA: 04/10/2017 PCP: Unk Pinto, MD   Chief Complaint  Patient presents with  . Nausea  . Dizziness    Brief Narrative:  HPI on 04/10/2017 by Dr. Jenetta Downer Robert Stanton is a 76 y.o. male with medical history significant for- hypertension, coronary artery disease- CABG- 2010,  atrial fibrillation CKD5, hypertension, esophageal strictures, gout, COPD, OSA, mechanical aortic valve, hypothyroidism, peripheral artery disease, type II DM. Patient came in with complaints of bleeding from his rectum was started about 3 days ago, he also reports dark stools- 3-4 days. He denies abdominal pain. In the ED he had an episode of vomiting- with coffee-ground emesis.  He endorses dizziness that started Saturday morning- 04/09/17. This appears to be a chronic issue for patient. Pt denies NSAID use. Patient reports a history of peptic ulcer disease diagnosed with EGD- several years ago. Last EGD 10/2012- was esophageal stricture and erosions, erosive gastritis. Last colonoscopy was about 10 years ago- reports it was normal. He also notes a chronic cough that is productive today, though he has a history of COPD. His shortness of breath is at baseline. He denies fever or chills.  Interim history Patient was found to have heme positive stools, with increasing creatinine and elevated INR. He was admitted for evaluation of GI bleeding. Patient underwent EGD showing AVMs status post hemostatic endoscopy with APC. Nephrology also consulted for possible initiation of hemodialysis.  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 Esophageal stricture without esophagitis. Moderately large hiatal hernia with [name] erosions. -Per gastroenterology, patient may  resume anticoagulation -Hemoglobin currently 8.5 (baseline hemoglobin 8-9) -Continue to monitor CBC -Continue iron supplementation 325mg  BID, Protonix indefinitely  Acute renal failure on chronic kidney disease, stage IV with oliguria -Creatinine continues to worsen, today 10.95 -Nephrology consult and appreciated, plan for patient to start hemodialysis today -As given IV diuresis however remained oliguric with fluid overload -Intervaginal radiology consulted and appreciated for HD catheter placement -Of note, patient did have aVF on left upper extremity 03/04/2017  Acute on Chronic systolic heart failure -Echocardiogram pending -Last echocardiogram 05/20/2016 showed an EF of 45%, basal to mid inferior severe hypokinesis. Indeterminate diastolic function -Patient was given diuresis per nephrology however patient continued to remain oliguric -Monitor intake and output, daily weights  Right loculated pleural effusion -No signs of an infection at this time -CT chest: Right-sided calcified pleural plaque, nonspecific with differential including asbestosis related pleural disease versus pleural insult such as trauma or infection. Small bilateral pleural effusions, probably loculated bilaterally with small loculated components. Right major fissure.  Atrial fibrillation/aortic valve replacement -Patient presented with supratherapeutic INR, given vitamin K as well as fresh frozen plasma -INR currently 1.54 -May restart Coumadin after vascular procedure  Essential hypertension -BP currently stable, was hypotensive -Continue to monitor closely and start IV medications as needed  Diabetes mellitus, type II -Hemoglobin A1c was 5.1 on 03/05/2017  Atherosclerotic thoracic aorta -4.3 cm ectatic ascending thoracic aorta noted on CT scan -Patient will need annual imaging by CTA or MRA  Possible cirrhosis -Noted on CT chest -Patient noted to have mildly increased AST, 69 -Will continue to  monitor CMP  Mildly elevated troponin -Likely secondary to the above, currently chest pain-free -Troponin elevation not consistent with ACS  Hypothyroidism -Continue Synthroid  DVT Prophylaxis  SCDs  Code Status: Full  Family Communication: None at bedside  Disposition Plan: admitted. Pending HD catheter placement, hemodialysis to begin  Consultants Nephrology Gastroenterology Interventional radiology   Procedures  EGD   Antibiotics   Anti-infectives    Start     Dose/Rate Route Frequency Ordered Stop   04/12/17 1407  ceFAZolin (ANCEF) 2-4 GM/100ML-% IVPB    Comments:  Stanton, Robert   : cabinet override      04/12/17 1407 04/13/17 0214   04/12/17 1100  ceFAZolin (ANCEF) IVPB 2g/100 mL premix     2 g 200 mL/hr over 30 Minutes Intravenous On call 04/12/17 1019 04/13/17 1100      Subjective:   Robert Stanton seen and examined today. Patient has no complaints today, denies further hematemesis or bleeding. Does complain of feeling short of breath. Denies chest pain, abdominal pain, dizziness or headache.   Objective:   Vitals:   04/12/17 0020 04/12/17 0500 04/12/17 0600 04/12/17 0914  BP: (!) 115/55  (!) 128/56   Pulse: 76  78 77  Resp:   20 20  Temp:   98 F (36.7 C)   TempSrc:   Oral   SpO2: 96%  95% 96%  Weight:  113.4 kg (250 lb)    Height:        Intake/Output Summary (Last 24 hours) at 04/12/17 1414 Last data filed at 04/12/17 0522  Gross per 24 hour  Intake           765.09 ml  Output                0 ml  Net           765.09 ml   Filed Weights   04/11/17 0151 04/11/17 0528 04/12/17 0500  Weight: 109.8 kg (242 lb) 111.9 kg (246 lb 12.8 oz) 113.4 kg (250 lb)    Exam  General: Well developed, Ill-appearing, in no apparent distress  HEENT: NCAT, mucous membranes moist.   Cardiovascular: S1 S2 auscultated, no rubs, murmurs or gallops. Regular rate and rhythm.  Respiratory: Diminished breath sounds  Abdomen: Soft, RUQ/epigastric TTP,  nondistended, + bowel sounds  Extremities: warm dry without cyanosis clubbing. LE edema 2+  Neuro: AAOx3, nonfocal  Psych: Appropriate   Data Reviewed: I have personally reviewed following labs and imaging studies  CBC:  Recent Labs Lab 04/05/17 1554  04/10/17 2003 04/11/17 0911 04/11/17 1835 04/12/17 0154 04/12/17 1023  WBC 6.1  --  7.9 8.0 8.2 8.0 9.6  NEUTROABS 4,270  --   --   --   --   --   --   HGB 9.2*  < > 9.4* 8.3* 8.2* 8.3* 8.5*  HCT 28.7*  --  28.0* 25.0* 24.3* 25.2* 26.1*  MCV 95.7  --  93.0 93.3 93.1 93.0 93.5  PLT 171  --  176 181 178 169 185  < > = values in this interval not displayed. Basic Metabolic Panel:  Recent Labs Lab 04/05/17 1554 04/10/17 2003 04/11/17 0911 04/12/17 0154  NA 136 130* 131* 132*  K 3.5 3.6 3.9 4.0  CL 93* 92* 92* 93*  CO2 28 22 21* 21*  GLUCOSE 77 116* 111* 129*  BUN 107* 138* 141* 144*  CREATININE 6.57* 9.90* 10.23* 10.95*  CALCIUM 9.0 8.7* 8.5* 8.4*  PHOS  --   --   --  7.0*   GFR: Estimated Creatinine Clearance: 7.2 mL/min (A) (by C-G formula based on SCr of 10.95 mg/dL (H)). Liver Function Tests:  Recent Labs Lab 04/10/17 2003 04/12/17 0154  AST 69*  --   ALT 26  --   ALKPHOS 69  --   BILITOT 1.8*  --   PROT 7.3  --   ALBUMIN 2.8* 2.9*    Recent Labs Lab 04/10/17 2046  LIPASE 45   No results for input(s): AMMONIA in the last 168 hours. Coagulation Profile:  Recent Labs Lab 04/05/17 1554 04/10/17 2046 04/11/17 0646 04/12/17 0154  INR 3.3* 4.34* 1.93 1.54   Cardiac Enzymes:  Recent Labs Lab 04/11/17 0216 04/11/17 0646 04/11/17 1425  TROPONINI 0.03* 0.03* 0.05*   BNP (last 3 results) No results for input(s): PROBNP in the last 8760 hours. HbA1C: No results for input(s): HGBA1C in the last 72 hours. CBG: No results for input(s): GLUCAP in the last 168 hours. Lipid Profile: No results for input(s): CHOL, HDL, LDLCALC, TRIG, CHOLHDL, LDLDIRECT in the last 72 hours. Thyroid Function  Tests: No results for input(s): TSH, T4TOTAL, FREET4, T3FREE, THYROIDAB in the last 72 hours. Anemia Panel: No results for input(s): VITAMINB12, FOLATE, FERRITIN, TIBC, IRON, RETICCTPCT in the last 72 hours. Urine analysis:    Component Value Date/Time   COLORURINE YELLOW 09/26/2016 1939   APPEARANCEUR CLEAR 09/26/2016 1939   LABSPEC 1.005 09/26/2016 1939   PHURINE 5.0 09/26/2016 1939   GLUCOSEU NEGATIVE 09/26/2016 1939   HGBUR MODERATE (A) 09/26/2016 1939   BILIRUBINUR NEGATIVE 09/26/2016 1939   KETONESUR NEGATIVE 09/26/2016 1939   PROTEINUR NEGATIVE 09/26/2016 1939   NITRITE NEGATIVE 09/26/2016 1939   LEUKOCYTESUR NEGATIVE 09/26/2016 1939   Sepsis Labs: @LABRCNTIP (procalcitonin:4,lacticidven:4)  )No results found for this or any previous visit (from the past 240 hour(s)).    Radiology Studies: Ct Abdomen Pelvis Wo Contrast  Result Date: 04/10/2017 CLINICAL DATA:  Abdominal pain. History of renal cysts, abdominal aortic aneurysm, hiatal hernia, chronic kidney disease. EXAM: CT ABDOMEN AND PELVIS WITHOUT CONTRAST TECHNIQUE: Multidetector CT imaging of the abdomen and pelvis was performed following the standard protocol without IV contrast. COMPARISON:  CT abdomen and pelvis September 26, 2016 FINDINGS: LOWER CHEST: New small to moderate pleural effusions with suspected loculation on the RIGHT. The heart is mildly enlarged. At least mild coronary artery calcifications versus stent. HEPATOBILIARY: Dense liver seen with amiodarone use, slightly nodular contour with atrophy. Normal noncontrast CT gallbladder. PANCREAS: Normal. SPLEEN: Normal. ADRENALS/URINARY TRACT: Kidneys are orthotopic,, mildly atrophic LEFT kidney kidneys are orthotopic. Bilateral renal cysts measuring to 5.7 cm upper pole RIGHT kidney. 2 mm RIGHT lower pole nephrolithiasis. Bilateral vascular calcifications. Kicked No hydronephrosis; limited assessment for renal masses on this nonenhanced examination. The unopacified  ureters are normal in course and caliber. Urinary bladder is partially distended and unremarkable. Stable 18 mm benign LEFT adrenal adenoma (-22 Hounsfield units). STOMACH/BOWEL: Small hiatal hernia. The stomach, small and large bowel are normal in course and caliber without inflammatory changes. Moderate sigmoid diverticulosis. The appendix is not discretely identified, however there are no inflammatory changes in the right lower quadrant. 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 Severe calcific atherosclerosis. Status post fem-fem bypass. REPRODUCTIVE: Normal. OTHER: Moderate volume ascites.  No intraperitoneal free air. MUSCULOSKELETAL: Anasarca. Multiple subcentimeter nodules anterior abdominal wall subcutaneous fat, possible granulomas. Small bilateral fat containing inguinal hernias. Surgical clips RIGHT inguinal soft tissues compatible with prior vascular access. 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  Anasarca. Nonobstructing 2 mm RIGHT lower pole, bilateral  renal vascular calcifications. New small to moderate pleural effusions with suspected loculation on the RIGHT. Severe atherosclerosis. Electronically Signed   By: Elon Alas M.D.   On: 04/10/2017 22:47   Dg Chest 2 View  Result Date: 04/10/2017 CLINICAL DATA:  Nausea and lightheadedness for 3 days. History of atrial fibrillation, COPD. EXAM: CHEST  2 VIEW COMPARISON:  Chest radiograph March 28, 2017 inch chest radiograph November 27, 2015 FINDINGS: Cardiac silhouette is moderately enlarged and unchanged. Mediastinal silhouette is nonsuspicious. Status post median sternotomy for CABG. Calcified aortic knob. Rounded RIGHT lung base pleural density increased from prior CT. Mild chronic bronchitic changes, increased lung volumes with bibasilar scarring. No  pneumothorax. Osteopenia. IMPRESSION: Increasing pleural-based suspected loculated pleural effusion though, mass not excluded. Recommend CT chest with contrast. Stable cardiomegaly and COPD with bibasilar atelectasis/ scarring. Electronically Signed   By: Elon Alas M.D.   On: 04/10/2017 22:33   Ct Chest Wo Contrast  Result Date: 04/12/2017 CLINICAL DATA:  Inpatient.  Chronic productive cough. EXAM: CT CHEST WITHOUT CONTRAST TECHNIQUE: Multidetector CT imaging of the chest was performed following the standard protocol without IV contrast. COMPARISON:  Chest radiograph from one day prior. 03/02/2013 chest CT. FINDINGS: Cardiovascular: Mild cardiomegaly. No significant pericardial fluid/thickening. Aortic valvular prosthesis is in place. Left main, left anterior descending, left circumflex and right coronary atherosclerosis status post CABG. Atherosclerotic thoracic aorta with 4.3 cm ectatic ascending thoracic aorta. Top-normal caliber main pulmonary artery (3.1 cm diameter). Mediastinum/Nodes: No discrete thyroid nodules. Unremarkable esophagus. No axillary adenopathy. Mild right paratracheal adenopathy measuring up to 1.0 cm (series 3/ image 29). No additional pathologically enlarged mediastinal or gross hilar nodes on this noncontrast scan. Lungs/Pleura: No pneumothorax. Calcified pleural plaque in the medial posterior right pleural space. No left-sided calcified pleural plaques. There are small dependent bilateral pleural effusions, probably loculated bilaterally with small loculated component in the superior portion of the right major fissure (series 3/image 80). Mild paraseptal emphysema. Mild-to-moderate compressive atelectasis in the bilateral lower lobes. No lung masses or significant pulmonary nodules in the aerated portions of the lungs. Upper abdomen: Small hiatal hernia. Liver surface appears finely irregular, cannot exclude cirrhosis. Small volume ascites in the visualized upper abdomen.  Multiple simple appearing renal cysts in the upper kidneys bilaterally, largest 5.7 cm in the posterior upper right kidney. Musculoskeletal: No aggressive appearing focal osseous lesions. Moderate thoracic spondylosis. Sternotomy wires appear intact. Moderate anasarca. IMPRESSION: 1. Right-sided calcified pleural plaque, which is nonspecific with the differential including asbestos related pleural disease (usually bilateral) versus remote pleural insult (trauma or infection). 2. Small dependent bilateral pleural effusions, probably loculated bilaterally with small loculated component in the superior right major fissure. 3. Mild cardiomegaly. 4. Possible cirrhosis.  Small volume upper abdominal ascites. 5. Moderate anasarca. 6. 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 Recommend annual imaging followup by CTA or MRA. This recommendation follows 2010 ACCF/AHA/AATS/ACR/ASA/SCA/SCAI/SIR/STS/SVM Guidelines for the Diagnosis and Management of Patients with Thoracic Aortic Disease. Circulation. 2010; 121: B846-K599. 7. Mild mediastinal lymphadenopathy, nonspecific, probably reactive. 8. Small hiatal hernia. Aortic Atherosclerosis (ICD10-I70.0) and Emphysema (ICD10-J43.9). Electronically Signed   By: Ilona Sorrel M.D.   On: 04/12/2017 09:21     Scheduled Meds: . albuterol  2.5 mg Nebulization TID  . fentaNYL      . heparin      . levothyroxine  25 mcg Intravenous Daily  . lidocaine (PF)      . midazolam      . montelukast  10 mg Oral QHS  .  pantoprazole  40 mg Oral Q0600   Continuous Infusions: . ceFAZolin    .  ceFAZolin (ANCEF) IV 2 g (04/12/17 1410)  . furosemide Stopped (04/12/17 1310)     LOS: 2 days   Time Spent in minutes   40 minutes  Robert Stanton D.O. on 04/12/2017 at 2:14 PM  Between 7am to 7pm - Pager - [phone]  After 7pm go to [email or web address] - password [email or web address]  And look for the night coverage person covering for me after hours  [facility] Office  [phone]

## 2017-04-12 NOTE — Progress Notes (Signed)
Assessment:  1 Oliguric AKI hemodynamically mediated due to GI blood loss 2 CKD 4 3 s/p AVF on LUE 03/04/17 4 Volume overload with pl effusions, ascites and edema 4 ABLA due to GI loss 5 Gastric AVMs s/p tx 6 ? Cirrhosis by CT Plan: IR to place HD cath and begin HD today.  I d/w pt and son via telephone.   Subjective: Interval History: feels ok  Objective: Vital signs in last 24 hours: Temp:  [97.1 F (36.2 C)-98 F (36.7 C)] 98 F (36.7 C) (06/26 0600) Pulse Rate:  [69-78] 78 (06/26 0600) Resp:  [15-20] 20 (06/26 0600) BP: (85-128)/(28-56) 128/56 (06/26 0600) SpO2:  [93 %-99 %] 95 % (06/26 0600) Weight:  [113.4 kg (250 lb)] 113.4 kg (250 lb) (06/26 0500) Weight change: 3.629 kg (8 lb)  Intake/Output from previous day: 06/25 0701 - 06/26 0700 In: 965.1 [I.V.:767.1; IV Piggyback:198] Out: -  Intake/Output this shift: No intake/output data recorded.  Resp: diminished breath sounds bilaterally Chest wall: no tenderness GI: RUQ and epi tenderness Extremities: edema 2+  No asterixis LUE AVF  Lab Results:  Recent Labs  04/11/17 1835 04/12/17 0154  WBC 8.2 8.0  HGB 8.2* 8.3*  HCT 24.3* 25.2*  PLT 178 169   BMET:  Recent Labs  04/11/17 0911 04/12/17 0154  NA 131* 132*  K 3.9 4.0  CL 92* 93*  CO2 21* 21*  GLUCOSE 111* 129*  BUN 141* 144*  CREATININE 10.23* 10.95*  CALCIUM 8.5* 8.4*   No results for input(s): PTH in the last 72 hours. Iron Studies: No results for input(s): IRON, TIBC, TRANSFERRIN, FERRITIN in the last 72 hours. Studies/Results: Ct Abdomen Pelvis Wo Contrast  Result Date: 04/10/2017 CLINICAL DATA:  Abdominal pain. History of renal cysts, abdominal aortic aneurysm, hiatal hernia, chronic kidney disease. EXAM: CT ABDOMEN AND PELVIS WITHOUT CONTRAST TECHNIQUE: Multidetector CT imaging of the abdomen and pelvis was performed following the standard protocol without IV contrast. COMPARISON:  CT abdomen and pelvis September 26, 2016 FINDINGS:  LOWER CHEST: New small to moderate pleural effusions with suspected loculation on the RIGHT. The heart is mildly enlarged. At least mild coronary artery calcifications versus stent. HEPATOBILIARY: Dense liver seen with amiodarone use, slightly nodular contour with atrophy. Normal noncontrast CT gallbladder. PANCREAS: Normal. SPLEEN: Normal. ADRENALS/URINARY TRACT: Kidneys are orthotopic,, mildly atrophic LEFT kidney kidneys are orthotopic. Bilateral renal cysts measuring to 5.7 cm upper pole RIGHT kidney. 2 mm RIGHT lower pole nephrolithiasis. Bilateral vascular calcifications. Kicked No hydronephrosis; limited assessment for renal masses on this nonenhanced examination. The unopacified ureters are normal in course and caliber. Urinary bladder is partially distended and unremarkable. Stable 18 mm benign LEFT adrenal adenoma (-22 Hounsfield units). STOMACH/BOWEL: Small hiatal hernia. The stomach, small and large bowel are normal in course and caliber without inflammatory changes. Moderate sigmoid diverticulosis. The appendix is not discretely identified, however there are no inflammatory changes in the right lower quadrant. 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 Severe calcific atherosclerosis. Status post fem-fem bypass. REPRODUCTIVE: Normal. OTHER: Moderate volume ascites.  No intraperitoneal free air. MUSCULOSKELETAL: Anasarca. Multiple subcentimeter nodules anterior abdominal wall subcutaneous fat, possible granulomas. Small bilateral fat containing inguinal hernias. Surgical clips RIGHT inguinal soft tissues compatible with prior vascular access. 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  Anasarca. Nonobstructing 2 mm RIGHT lower pole, bilateral renal vascular calcifications. New small to moderate pleural  effusions with  suspected loculation on the RIGHT. Severe atherosclerosis. Electronically Signed   By: Elon Alas M.D.   On: 04/10/2017 22:47   Dg Chest 2 View  Result Date: 04/10/2017 CLINICAL DATA:  Nausea and lightheadedness for 3 days. History of atrial fibrillation, COPD. EXAM: CHEST  2 VIEW COMPARISON:  Chest radiograph March 28, 2017 inch chest radiograph November 27, 2015 FINDINGS: Cardiac silhouette is moderately enlarged and unchanged. Mediastinal silhouette is nonsuspicious. Status post median sternotomy for CABG. Calcified aortic knob. Rounded RIGHT lung base pleural density increased from prior CT. Mild chronic bronchitic changes, increased lung volumes with bibasilar scarring. No pneumothorax. Osteopenia. IMPRESSION: Increasing pleural-based suspected loculated pleural effusion though, mass not excluded. Recommend CT chest with contrast. Stable cardiomegaly and COPD with bibasilar atelectasis/ scarring. Electronically Signed   By: Elon Alas M.D.   On: 04/10/2017 22:33    Scheduled: . albuterol  2.5 mg Nebulization TID  . montelukast  10 mg Oral QHS  . [START ON 04/14/2017] pantoprazole  40 mg Intravenous Q12H    LOS: 2 days   Robert Stanton C 04/12/2017,7:52 AM

## 2017-04-12 NOTE — Progress Notes (Signed)
Patient has had no urinary output noted.  Bladder scan completed and 37 ml urine noted. Patient is stable and no new issues noted.  P.J. Linus Mako, RN

## 2017-04-12 NOTE — Procedures (Signed)
esrd  S/P RT IJ HD TUNNELED CATH  NO COMP STABLE EBL 10CC TIP SVCRA READY FOR USE FULL REPORT IN PACS

## 2017-04-12 NOTE — Progress Notes (Signed)
Daily Rounding Note  04/12/2017, 9:34 AM  LOS: 2 days   SUBJECTIVE:   Chief complaint:  Feels shaky.   Nausea but no vomiting.  Stools "smell like GI bleed" per RN to RN report.    OBJECTIVE:         Vital signs in last 24 hours:    Temp:  [97.1 F (36.2 C)-98 F (36.7 C)] 98 F (36.7 C) (06/26 0600) Pulse Rate:  [69-78] 77 (06/26 0914) Resp:  [15-20] 20 (06/26 0914) BP: (85-128)/(28-56) 128/56 (06/26 0600) SpO2:  [93 %-99 %] 96 % (06/26 0914) Weight:  [113.4 kg (250 lb)] 113.4 kg (250 lb) (06/26 0500) Last BM Date: 04/11/17 Filed Weights   04/11/17 0151 04/11/17 0528 04/12/17 0500  Weight: 109.8 kg (242 lb) 111.9 kg (246 lb 12.8 oz) 113.4 kg (250 lb)   General: looks ill, frail   Heart: RRR Chest: wheezing and dyspneic Abdomen: soft, NT.  BS hypoactive.    Extremities: + edema Neuro/Psych:  Alert, appropriate, oriented x 3.  HOH.  Moves all 4 limbs.    Intake/Output from previous day: 06/25 0701 - 06/26 0700 In: 965.1 [I.V.:767.1; IV Piggyback:198] Out: -   Intake/Output this shift: No intake/output data recorded.  Lab Results:  Recent Labs  04/11/17 0911 04/11/17 1835 04/12/17 0154  WBC 8.0 8.2 8.0  HGB 8.3* 8.2* 8.3*  HCT 25.0* 24.3* 25.2*  PLT 181 178 169   BMET  Recent Labs  04/10/17 2003 04/11/17 0911 04/12/17 0154  NA 130* 131* 132*  K 3.6 3.9 4.0  CL 92* 92* 93*  CO2 22 21* 21*  GLUCOSE 116* 111* 129*  BUN 138* 141* 144*  CREATININE 9.90* 10.23* 10.95*  CALCIUM 8.7* 8.5* 8.4*   LFT  Recent Labs  04/10/17 2003 04/12/17 0154  PROT 7.3  --   ALBUMIN 2.8* 2.9*  AST 69*  --   ALT 26  --   ALKPHOS 69  --   BILITOT 1.8*  --    PT/INR  Recent Labs  04/11/17 0646 04/12/17 0154  LABPROT 22.3* 18.6*  INR 1.93 1.54   Hepatitis Panel No results for input(s): HEPBSAG, HCVAB, HEPAIGM, HEPBIGM in the last 72 hours.  Studies/Results: Ct Abdomen Pelvis Wo  Contrast  Result Date: 04/10/2017 CLINICAL DATA:  Abdominal pain. History of renal cysts, abdominal aortic aneurysm, hiatal hernia, chronic kidney disease. EXAM: CT ABDOMEN AND PELVIS WITHOUT CONTRAST TECHNIQUE: Multidetector CT imaging of the abdomen and pelvis was performed following the standard protocol without IV contrast. COMPARISON:  CT abdomen and pelvis September 26, 2016 FINDINGS: LOWER CHEST: New small to moderate pleural effusions with suspected loculation on the RIGHT. The heart is mildly enlarged. At least mild coronary artery calcifications versus stent. HEPATOBILIARY: Dense liver seen with amiodarone use, slightly nodular contour with atrophy. Normal noncontrast CT gallbladder. PANCREAS: Normal. SPLEEN: Normal. ADRENALS/URINARY TRACT: Kidneys are orthotopic,, mildly atrophic LEFT kidney kidneys are orthotopic. Bilateral renal cysts measuring to 5.7 cm upper pole RIGHT kidney. 2 mm RIGHT lower pole nephrolithiasis. Bilateral vascular calcifications. Kicked No hydronephrosis; limited assessment for renal masses on this nonenhanced examination. The unopacified ureters are normal in course and caliber. Urinary bladder is partially distended and unremarkable. Stable 18 mm benign LEFT adrenal adenoma (-22 Hounsfield units). STOMACH/BOWEL: Small hiatal hernia. The stomach, small and large bowel are normal in course and caliber without inflammatory changes. Moderate sigmoid diverticulosis. The appendix is not discretely identified, however there are no inflammatory  changes in the right lower quadrant. 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 Severe calcific atherosclerosis. Status post fem-fem bypass. REPRODUCTIVE: Normal. OTHER: Moderate volume ascites.  No intraperitoneal free air. MUSCULOSKELETAL: Anasarca. Multiple subcentimeter nodules anterior abdominal wall subcutaneous fat, possible granulomas. Small bilateral fat containing inguinal  hernias. Surgical clips RIGHT inguinal soft tissues compatible with prior vascular access. 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  Anasarca. Nonobstructing 2 mm RIGHT lower pole, bilateral renal vascular calcifications. New small to moderate pleural effusions with suspected loculation on the RIGHT. Severe atherosclerosis. Electronically Signed   By: Elon Alas M.D.   On: 04/10/2017 22:47   Dg Chest 2 View  Result Date: 04/10/2017 CLINICAL DATA:  Nausea and lightheadedness for 3 days. History of atrial fibrillation, COPD. EXAM: CHEST  2 VIEW COMPARISON:  Chest radiograph March 28, 2017 inch chest radiograph November 27, 2015 FINDINGS: Cardiac silhouette is moderately enlarged and unchanged. Mediastinal silhouette is nonsuspicious. Status post median sternotomy for CABG. Calcified aortic knob. Rounded RIGHT lung base pleural density increased from prior CT. Mild chronic bronchitic changes, increased lung volumes with bibasilar scarring. No pneumothorax. Osteopenia. IMPRESSION: Increasing pleural-based suspected loculated pleural effusion though, mass not excluded. Recommend CT chest with contrast. Stable cardiomegaly and COPD with bibasilar atelectasis/ scarring. Electronically Signed   By: Elon Alas M.D.   On: 04/10/2017 22:33   Ct Chest Wo Contrast  Result Date: 04/12/2017 CLINICAL DATA:  Inpatient.  Chronic productive cough. EXAM: CT CHEST WITHOUT CONTRAST TECHNIQUE: Multidetector CT imaging of the chest was performed following the standard protocol without IV contrast. COMPARISON:  Chest radiograph from one day prior. 03/02/2013 chest CT. FINDINGS: Cardiovascular: Mild cardiomegaly. No significant pericardial fluid/thickening. Aortic valvular prosthesis is in place. Left main, left anterior descending, left circumflex and right coronary atherosclerosis status post CABG.  Atherosclerotic thoracic aorta with 4.3 cm ectatic ascending thoracic aorta. Top-normal caliber main pulmonary artery (3.1 cm diameter). Mediastinum/Nodes: No discrete thyroid nodules. Unremarkable esophagus. No axillary adenopathy. Mild right paratracheal adenopathy measuring up to 1.0 cm (series 3/ image 29). No additional pathologically enlarged mediastinal or gross hilar nodes on this noncontrast scan. Lungs/Pleura: No pneumothorax. Calcified pleural plaque in the medial posterior right pleural space. No left-sided calcified pleural plaques. There are small dependent bilateral pleural effusions, probably loculated bilaterally with small loculated component in the superior portion of the right major fissure (series 3/image 80). Mild paraseptal emphysema. Mild-to-moderate compressive atelectasis in the bilateral lower lobes. No lung masses or significant pulmonary nodules in the aerated portions of the lungs. Upper abdomen: Small hiatal hernia. Liver surface appears finely irregular, cannot exclude cirrhosis. Small volume ascites in the visualized upper abdomen. Multiple simple appearing renal cysts in the upper kidneys bilaterally, largest 5.7 cm in the posterior upper right kidney. Musculoskeletal: No aggressive appearing focal osseous lesions. Moderate thoracic spondylosis. Sternotomy wires appear intact. Moderate anasarca. IMPRESSION: 1. Right-sided calcified pleural plaque, which is nonspecific with the differential including asbestos related pleural disease (usually bilateral) versus remote pleural insult (trauma or infection). 2. Small dependent bilateral pleural effusions, probably loculated bilaterally with small loculated component in the superior right major fissure. 3. Mild cardiomegaly. 4. Possible cirrhosis.  Small volume upper abdominal ascites. 5. Moderate anasarca. 6. 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 Recommend annual imaging followup by CTA or MRA. This recommendation follows 2010  ACCF/AHA/AATS/ACR/ASA/SCA/SCAI/SIR/STS/SVM Guidelines for the Diagnosis and Management of Patients with Thoracic  Aortic Disease. Circulation. 2010; 121: M355-H741. 7. Mild mediastinal lymphadenopathy, nonspecific, probably reactive. 8. Small hiatal hernia. Aortic Atherosclerosis (ICD10-I70.0) and Emphysema (ICD10-J43.9). Electronically Signed   By: Ilona Sorrel M.D.   On: 04/12/2017 09:21   Scheduled Meds: . albuterol  2.5 mg Nebulization TID  . levothyroxine  25 mcg Intravenous Daily  . montelukast  10 mg Oral QHS  . [START ON 04/14/2017] pantoprazole  40 mg Intravenous Q12H   Continuous Infusions: . furosemide Stopped (04/12/17 0622)  . pantoprozole (PROTONIX) infusion 8 mg/hr (04/12/17 0520)   PRN Meds:.ipratropium-albuterol   ASSESMENT:   *  Cg emesis and maroon stools. 6/25 EGD:  APCd bleeding AVM.  Non-obstructing esophageal strictures.  Large HH with Cameron erosions.   Hemorrhoids (s/p banding), tics on previous colonoscopy On low dose Famotidine PTA, now on Protonix gtt.  Will switch to oral daily, that should continue indefinitely.    *  Normocytic anemia.  Hgb stable.  No PRBCs to date. 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  INR mildly suprtherapeutic, corrected with 4 of FFP.   *  ? Cirrhosis per CT scan.  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  No portal htn or varices on EGD.    *  bil pleural effusions.   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  Oliguric.  Going for HD catheter placement, starts HD this afternoon.     PLAN   *  For HD catheter placement, HD session #1 today.    *  Switched to daily, oral Protonix today and explained this to pt.    Azucena Freed  04/12/2017, 9:34 AM Pager: [phone]  GI ATTENDING  Interval history data reviewed. Patient seen and examined. Agree with interval progress note. No clinically significant bleeding since yesterday's EGD with hemostatic therapy. Bleeding AVM  successfully treated. Hemoglobin stable. No endoscopic treatment available for [name] erosions (erosions associated with hiatal hernia). I expect chronic low-grade oozing over time.  RECOMMENDATIONS 1. Iron sulfate 325 mg by mouth twice a day indefinitely  2. Pantoprazole 40 mg by mouth daily indefinitely 3. PRBC transfusions as clinically indicated 4. Advance diet 5. GI available for questions or problems. Findings and recommendations reviewed yesterday with patient's son as well. Procedure report provided. Will sign off.  Docia Chuck. Geri Seminole., M.D. [facility] Division of Gastroenterology

## 2017-04-12 NOTE — Consult Note (Signed)
Chief Complaint: Patient was seen in consultation today for renal failure  Referring Physician(s):  Dr. Erling Cruz  Supervising Physician: Daryll Brod  Patient Status: [facility] - In-pt  History of Present Illness: Robert Stanton is a 76 y.o. male with past medical history of CAD, renal failure, COPD, GERD, and HTN presents to [facility] with dizziness and shortness of breath.  Patient s/p AVF in LUE 03/04/17, but will need dialysis in the interim.  IR consulted for HD catheter placement at the request of Dr. Florene Glen.   Patient has not eaten breakfast this AM-- due to nausea.  He is not on blood thinners.  INR this AM is 1.54  Past Medical History:  Diagnosis Date  . AAA (abdominal aortic aneurysm) ([place])   . Arthritis   . CAD (coronary artery disease)    a. CABG '00. b. all SVGs occluded 2010. c. low risk Nuc 2012   . Cataracts, bilateral   . Chronic anticoagulation   . CKD (chronic kidney disease), stage III   . COPD (chronic obstructive pulmonary disease) ([place])   . GERD (gastroesophageal reflux disease)   . Gout   . Heart murmur   . Hiatal hernia   . History of Doppler ultrasound 04/05/2013   LEAs; small distal abd. aoritc aneuysm is stable; fem-pop graft to R leg has 50-69% blockage  . History of echocardiogram 04/17/2013   EF 40-45%; LV hypertrophy; LV mild-mod dilated; AV regurg.; LA severely diated, RA mildly dilated  . History of nuclear stress test 05/14/2011   Dipyridamole; moderate perfusion defect in Basal Infrioer & Mid Inferior region - consistent w/infarct or scar; global LV systolic function mildly reduced; EKG negative for ischemia; low risk scan  . HOH (hard of hearing)   . Hyperlipidemia   . Hypertension   . Hypothyroidism   . Ischemic cardiomyopathy   . Kidney cysts   . Mechanical heart valve present    a. Hx mechanical St Jude AVR 2000.  [name] OSA (obstructive sleep apnea)    wears CPAP  . Peripheral arterial disease ([place])    a. s/p aorto bifem BG;  b. 11/2013  PVA distal R Fem-fem stenosis;  c. 12/2013 PTA of dist R femoral bypass graft.  . Permanent atrial fibrillation ([place])   . Prediabetes   . Ventricular tachycardia (paroxysmal) ([place])    Seen by Dr. Lovena Le with EP, started on amiodarone  . Vitamin B deficiency     Past Surgical History:  Procedure Laterality Date  . AORTIC VALVE REPLACEMENT     St. Jude  . AV FISTULA PLACEMENT Left 03/04/2017   Procedure: ARTERIOVENOUS (AV) FISTULA CREATION LEFT ARM;  Surgeon: Serafina Mitchell, MD;  Location: [facility];  Service: Vascular;  Laterality: Left;  . CARDIAC CATHETERIZATION  11/25/2008   loss of 2/3 (RCA, OM) bypass grafts with patent internal mammary artery to LAD; large collateral filling of RCA ; osteal narrowing of circumflex of ~50%  . CARDIOVERSION  8/282012  . CORONARY ARTERY BYPASS GRAFT  2000   3 vessel; LIMA to LAD; RCA, OM  . ESOPHAGOGASTRODUODENOSCOPY N/A 04/11/2017   Procedure: ESOPHAGOGASTRODUODENOSCOPY (EGD);  Surgeon: Irene Shipper, MD;  Location: [facility] ENDOSCOPY;  Service: Endoscopy;  Laterality: N/A;  . FEMORAL BYPASS    . LOWER EXTREMITY ANGIOGRAM N/A 12/03/2013   Procedure: LOWER EXTREMITY ANGIOGRAM;  Surgeon: Lorretta Harp, MD;  Location: [facility] CATH LAB;  Service: Cardiovascular;  Laterality: N/A;  . LOWER EXTREMITY ANGIOGRAM N/A 01/07/2014   Procedure:  LOWER EXTREMITY ANGIOGRAM;  Surgeon: Lorretta Harp, MD;  Location: [facility] CATH LAB;  Service: Cardiovascular;  Laterality: N/A;  . shunts in femoral arteries    . stents in kidneys      Allergies: Chantix [varenicline]; Lyrica [pregabalin]; Nsaids; Simvastatin; and Ziac [bisoprolol-hydrochlorothiazide]  Medications: Prior to Admission medications   Medication Sig Start Date End Date Taking? Authorizing Provider  albuterol (PROVENTIL) (2.5 MG/3ML) 0.083% nebulizer solution Take 3 mLs (2.5 mg total) by nebulization every 4 (four) hours as needed for wheezing or shortness of breath. 11/24/16  Yes Forcucci, Courtney, PA-C  allopurinol  (ZYLOPRIM) 300 MG tablet TAKE 1 TABLET BY MOUTH DAILY. TO PREVENT GOUT 02/23/17  Yes Vicie Mutters, PA-C  amiodarone (PACERONE) 200 MG tablet TAKE 1 TABLET (200 MG TOTAL) BY MOUTH DAILY. 02/23/17  Yes Hilty, [name], MD  aspirin EC 81 MG tablet Take 81 mg by mouth daily.   Yes [provider]  Cholecalciferol (VITAMIN D3) 5000 units TABS Take 10,000 Units by mouth daily.   Yes [provider]  clotrimazole-betamethasone (LOTRISONE) cream Apply 1 application topically daily as needed (rash/ skin irritation).   Yes [provider]  fenofibrate (TRICOR) 145 MG tablet TAKE 1 TABLET DAILY FOR BLOOD FATS 01/30/16  Yes Vicie Mutters, PA-C  isosorbide mononitrate (IMDUR) 30 MG 24 hr tablet TAKE 1 TABLET BY MOUTH EVERY DAY 11/07/16  Yes Unk Pinto, MD  levothyroxine (SYNTHROID, LEVOTHROID) 50 MCG tablet TAKE 1 TABLET BY MOUTH EVERY DAY 11/02/16  Yes Unk Pinto, MD  metolazone (ZAROXOLYN) 5 MG tablet Take 1 tablet daily as directed for fluid/swelling 03/28/17 06/28/17 Yes Unk Pinto, MD  montelukast (SINGULAIR) 10 MG tablet TAKE 1 TABLET (10 MG TOTAL) BY MOUTH DAILY. 11/19/16  Yes Unk Pinto, MD  NITROSTAT 0.4 MG SL tablet PLACE 1 TABLET (0.4 MG TOTAL) UNDER THE TONGUE EVERY 5 (FIVE) MINUTES AS NEEDED FOR CHEST PAIN. 02/11/16  Yes Vicie Mutters, PA-C  pravastatin (PRAVACHOL) 40 MG tablet TAKE 1 TABLET (40 MG TOTAL) BY MOUTH EVERY MORNING. 11/24/16  Yes Vicie Mutters, PA-C  sertraline (ZOLOFT) 50 MG tablet TAKE 1 TABLET (50 MG TOTAL) BY MOUTH DAILY. 10/01/16  Yes Unk Pinto, MD  torsemide (DEMADEX) 100 MG tablet Take 100 mg by mouth daily.   Yes [provider]  verapamil (CALAN) 80 MG tablet TAKE 1 TABLET (80 MG TOTAL) BY MOUTH 2 (TWO) TIMES DAILY. 02/23/17  Yes Vicie Mutters, PA-C  vitamin B-12 (CYANOCOBALAMIN) 1000 MCG tablet Take 1,000 mcg by mouth daily.   Yes [provider]  warfarin (COUMADIN) 5 MG tablet Take 1 tablet daily or as  directed by your doctor. Patient taking differently: Take 2.5 mg by mouth See admin instructions. Take 2.5 mg daily except Monday and Friday. 07/29/16  Yes Unk Pinto, MD     Family History  Problem Relation Age of Onset  . Heart attack Mother   . Heart attack Father   . Lung cancer Sister        x 2  . Kidney cancer Sister   . Kidney disease Sister   . Cancer Brother        ?  . Stroke Brother     Social History   Social History  . Marital status: Married    Spouse name: N/A  . Number of children: 3  . Years of education: N/A   Social History Main Topics  . Smoking status: Current Every Day Smoker    Packs/day: 0.50    Years:  40.00    Types: Cigarettes  . Smokeless tobacco: Never Used  . Alcohol use No  . Drug use: No  . Sexual activity: Not Asked   Other Topics Concern  . None   Social History Narrative   Lives in [place] with disabled wife, three sons, and grandson   Retired    Review of Systems  Constitutional: Negative for fatigue and fever.  Respiratory: Positive for shortness of breath. Negative for cough.   Cardiovascular: Negative for chest pain.  Gastrointestinal: Positive for nausea.  Psychiatric/Behavioral: Negative for behavioral problems and confusion.    Vital Signs: BP (!) 128/56 (BP Location: Right Arm)   Pulse 77   Temp 98 F (36.7 C) (Oral)   Resp 20   Ht 5\' 8"  (1.727 m)   Wt 250 lb (113.4 kg)   SpO2 96%   BMI 38.01 kg/m   Physical Exam  Constitutional: He is oriented to person, place, and time. He appears well-developed.  Cardiovascular: Normal rate, regular rhythm and normal heart sounds.   Pulmonary/Chest: No respiratory distress. He has wheezes.  Patient with increased effort, crackles at bases.  Neurological: He is alert and oriented to person, place, and time.  Skin: Skin is warm and dry.  Psychiatric: He has a normal mood and affect. His behavior is normal. Judgment and thought content normal.  Nursing note  and vitals reviewed.   Mallampati Score:  MD Evaluation Airway: WNL Heart: WNL Heart  comments: irregular  Abdomen: WNL Abdomen comments: Obese Chest/ Lungs: WNL Chest/ lungs comments: Decreased breath sounds bases Other Pertinent Findings: Multiple ecchymoses ASA  Classification: 3 Mallampati/Airway Score: Two  Imaging: Ct Abdomen Pelvis Wo Contrast  Result Date: 04/10/2017 CLINICAL DATA:  Abdominal pain. History of renal cysts, abdominal aortic aneurysm, hiatal hernia, chronic kidney disease. EXAM: CT ABDOMEN AND PELVIS WITHOUT CONTRAST TECHNIQUE: Multidetector CT imaging of the abdomen and pelvis was performed following the standard protocol without IV contrast. COMPARISON:  CT abdomen and pelvis September 26, 2016 FINDINGS: LOWER CHEST: New small to moderate pleural effusions with suspected loculation on the RIGHT. The heart is mildly enlarged. At least mild coronary artery calcifications versus stent. HEPATOBILIARY: Dense liver seen with amiodarone use, slightly nodular contour with atrophy. Normal noncontrast CT gallbladder. PANCREAS: Normal. SPLEEN: Normal. ADRENALS/URINARY TRACT: Kidneys are orthotopic,, mildly atrophic LEFT kidney kidneys are orthotopic. Bilateral renal cysts measuring to 5.7 cm upper pole RIGHT kidney. 2 mm RIGHT lower pole nephrolithiasis. Bilateral vascular calcifications. Kicked No hydronephrosis; limited assessment for renal masses on this nonenhanced examination. The unopacified ureters are normal in course and caliber. Urinary bladder is partially distended and unremarkable. Stable 18 mm benign LEFT adrenal adenoma (-22 Hounsfield units). STOMACH/BOWEL: Small hiatal hernia. The stomach, small and large bowel are normal in course and caliber without inflammatory changes. Moderate sigmoid diverticulosis. The appendix is not discretely identified, however there are no inflammatory changes in the right lower quadrant. VASCULAR/LYMPHATIC: 2.8 cm ectatic infrarenal  aorta, Old infrarenal aorta heavily calcified dissection. Severe calcific atherosclerosis. Status post fem-fem bypass. REPRODUCTIVE: Normal. OTHER: Moderate volume ascites.  No intraperitoneal free air. MUSCULOSKELETAL: Anasarca. Multiple subcentimeter nodules anterior abdominal wall subcutaneous fat, possible granulomas. Small bilateral fat containing inguinal hernias. Surgical clips RIGHT inguinal soft tissues compatible with prior vascular access. 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  Anasarca. Nonobstructing 2 mm RIGHT lower pole, bilateral renal vascular calcifications. New small to moderate pleural effusions  with suspected loculation on the RIGHT. Severe atherosclerosis. Electronically Signed   By: Elon Alas M.D.   On: 04/10/2017 22:47   Dg Chest 2 View  Result Date: 04/10/2017 CLINICAL DATA:  Nausea and lightheadedness for 3 days. History of atrial fibrillation, COPD. EXAM: CHEST  2 VIEW COMPARISON:  Chest radiograph March 28, 2017 inch chest radiograph November 27, 2015 FINDINGS: Cardiac silhouette is moderately enlarged and unchanged. Mediastinal silhouette is nonsuspicious. Status post median sternotomy for CABG. Calcified aortic knob. Rounded RIGHT lung base pleural density increased from prior CT. Mild chronic bronchitic changes, increased lung volumes with bibasilar scarring. No pneumothorax. Osteopenia. IMPRESSION: Increasing pleural-based suspected loculated pleural effusion though, mass not excluded. Recommend CT chest with contrast. Stable cardiomegaly and COPD with bibasilar atelectasis/ scarring. Electronically Signed   By: Elon Alas M.D.   On: 04/10/2017 22:33   Dg Chest 2 View  Result Date: 03/28/2017 CLINICAL DATA:  Cough congestion and shortness of breath EXAM: CHEST  2 VIEW COMPARISON:  11/27/2015 FINDINGS: Post sternotomy changes. Small  bilateral pleural effusions. Peripheral right lower lung opacity. Streaky atelectasis or mild infiltrate at the left base as well. Borderline cardiomegaly with atherosclerosis. No pneumothorax. Degenerative changes of the spine. IMPRESSION: 1. Small bilateral effusions with peripherally based opacity in the right lower lung, possibly due to infiltrate or loculated pleural fluid. Streaky atelectasis or infiltrate at the left base. Imaging follow-up to resolution is recommended. 2. Borderline cardiomegaly Electronically Signed   By: Donavan Foil M.D.   On: 03/28/2017 22:59   Ct Chest Wo Contrast  Result Date: 04/12/2017 CLINICAL DATA:  Inpatient.  Chronic productive cough. EXAM: CT CHEST WITHOUT CONTRAST TECHNIQUE: Multidetector CT imaging of the chest was performed following the standard protocol without IV contrast. COMPARISON:  Chest radiograph from one day prior. 03/02/2013 chest CT. FINDINGS: Cardiovascular: Mild cardiomegaly. No significant pericardial fluid/thickening. Aortic valvular prosthesis is in place. Left main, left anterior descending, left circumflex and right coronary atherosclerosis status post CABG. Atherosclerotic thoracic aorta with 4.3 cm ectatic ascending thoracic aorta. Top-normal caliber main pulmonary artery (3.1 cm diameter). Mediastinum/Nodes: No discrete thyroid nodules. Unremarkable esophagus. No axillary adenopathy. Mild right paratracheal adenopathy measuring up to 1.0 cm (series 3/ image 29). No additional pathologically enlarged mediastinal or gross hilar nodes on this noncontrast scan. Lungs/Pleura: No pneumothorax. Calcified pleural plaque in the medial posterior right pleural space. No left-sided calcified pleural plaques. There are small dependent bilateral pleural effusions, probably loculated bilaterally with small loculated component in the superior portion of the right major fissure (series 3/image 80). Mild paraseptal emphysema. Mild-to-moderate compressive atelectasis  in the bilateral lower lobes. No lung masses or significant pulmonary nodules in the aerated portions of the lungs. Upper abdomen: Small hiatal hernia. Liver surface appears finely irregular, cannot exclude cirrhosis. Small volume ascites in the visualized upper abdomen. Multiple simple appearing renal cysts in the upper kidneys bilaterally, largest 5.7 cm in the posterior upper right kidney. Musculoskeletal: No aggressive appearing focal osseous lesions. Moderate thoracic spondylosis. Sternotomy wires appear intact. Moderate anasarca. IMPRESSION: 1. Right-sided calcified pleural plaque, which is nonspecific with the differential including asbestos related pleural disease (usually bilateral) versus remote pleural insult (trauma or infection). 2. Small dependent bilateral pleural effusions, probably loculated bilaterally with small loculated component in the superior right major fissure. 3. Mild cardiomegaly. 4. Possible cirrhosis.  Small volume upper abdominal ascites. 5. Moderate anasarca. 6. 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 Recommend annual imaging followup by CTA or MRA. This recommendation follows 2010 ACCF/AHA/AATS/ACR/ASA/SCA/SCAI/SIR/STS/SVM  Guidelines for the Diagnosis and Management of Patients with Thoracic Aortic Disease. Circulation. 2010; 121: E174-Y814. 7. Mild mediastinal lymphadenopathy, nonspecific, probably reactive. 8. Small hiatal hernia. Aortic Atherosclerosis (ICD10-I70.0) and Emphysema (ICD10-J43.9). Electronically Signed   By: Ilona Sorrel M.D.   On: 04/12/2017 09:21    Labs:  CBC:  Recent Labs  04/10/17 2003 04/11/17 0911 04/11/17 1835 04/12/17 0154  WBC 7.9 8.0 8.2 8.0  HGB 9.4* 8.3* 8.2* 8.3*  HCT 28.0* 25.0* 24.3* 25.2*  PLT 176 181 178 169    COAGS:  Recent Labs  03/04/17 0613  04/05/17 1554 04/10/17 2046 04/11/17 0646 04/12/17 0154  INR 1.38  < > 3.3* 4.34* 1.93 1.54  APTT 44*  --   --   --   --   --   < > = values in this interval not  displayed.  BMP:  Recent Labs  04/05/17 1554 04/10/17 2003 04/11/17 0911 04/12/17 0154  NA 136 130* 131* 132*  K 3.5 3.6 3.9 4.0  CL 93* 92* 92* 93*  CO2 28 22 21* 21*  GLUCOSE 77 116* 111* 129*  BUN 107* 138* 141* 144*  CALCIUM 9.0 8.7* 8.5* 8.4*  CREATININE 6.57* 9.90* 10.23* 10.95*  GFRNONAA 8* 5* 4* 4*  GFRAA 9* 5* 5* 5*    LIVER FUNCTION TESTS:  Recent Labs  09/26/16 1927 12/13/16 1608 02/10/17 1626 04/10/17 2003 04/12/17 0154  BILITOT 1.3* 0.9 1.0 1.8*  --   AST 47* 64* 64* 69*  --   ALT 28 33 22 26  --   ALKPHOS 64 59 76 69  --   PROT 7.7 7.3 7.5 7.3  --   ALBUMIN 3.7 3.8 3.3* 2.8* 2.9*    TUMOR MARKERS: No results for input(s): AFPTM, CEA, CA199, CHROMGRNA in the last 8760 hours.  Assessment and Plan: Acute-on-chronic renal failure Patient with history of renal failure who underwent AVF on LUE 03/04/17, not yet mature. Patient admitted with acute renal failure and volume overload.  IR consulted for HD catheter placement at the request of Dr. Florene Glen.  Patient has not eaten breakfast this AM.  He became nauseated after a few sips of water this morning and has not had anything to drink since.  Patient alert and oriented.  Understands he needs dialysis and asks about his fistula. All questions answered.  He does have bilateral wheezes.  Discussed with RN- does have breathing treatments planned today.  May be related to volume overload.  Risks and benefits discussed with the patient including, but not limited to bleeding, infection, vascular injury, pneumothorax which may require chest tube placement, air embolism or even death All of the patient's questions were answered, patient is agreeable to proceed. Consent signed and in chart.  Thank you for this interesting consult.  I greatly enjoyed meeting Robert Stanton and look forward to participating in their care.  A copy of this report was sent to the requesting provider on this date.  Electronically  Signed: Docia Barrier, PA 04/12/2017, 10:10 AM   I spent a total of 40 Minutes    in face to face in clinical consultation, greater than 50% of which was counseling/coordinating care for chronic kidney disease.

## 2017-04-13 ENCOUNTER — Other Ambulatory Visit (HOSPITAL_COMMUNITY): Payer: PPO

## 2017-04-13 DIAGNOSIS — M1000 Idiopathic gout, unspecified site: Secondary | ICD-10-CM

## 2017-04-13 DIAGNOSIS — F411 Generalized anxiety disorder: Secondary | ICD-10-CM

## 2017-04-13 DIAGNOSIS — J449 Chronic obstructive pulmonary disease, unspecified: Secondary | ICD-10-CM

## 2017-04-13 DIAGNOSIS — G4733 Obstructive sleep apnea (adult) (pediatric): Secondary | ICD-10-CM

## 2017-04-13 LAB — RENAL FUNCTION PANEL
Albumin: 2.9 g/dL — ABNORMAL LOW (ref 3.5–5.0)
Anion gap: 19 — ABNORMAL HIGH (ref 5–15)
BUN: 155 mg/dL — AB (ref 6–20)
CHLORIDE: 92 mmol/L — AB (ref 101–111)
CO2: 22 mmol/L (ref 22–32)
Calcium: 8 mg/dL — ABNORMAL LOW (ref 8.9–10.3)
Creatinine, Ser: 11.8 mg/dL — ABNORMAL HIGH (ref 0.61–1.24)
GFR calc Af Amer: 4 mL/min — ABNORMAL LOW (ref >60)
GFR calc non Af Amer: 4 mL/min — ABNORMAL LOW (ref >60)
GLUCOSE: 118 mg/dL — AB (ref 65–99)
POTASSIUM: 4.3 mmol/L (ref 3.5–5.1)
Phosphorus: 8.2 mg/dL — ABNORMAL HIGH (ref 2.5–4.6)
Sodium: 133 mmol/L — ABNORMAL LOW (ref 135–145)

## 2017-04-13 LAB — CBC
HEMATOCRIT: 25.8 % — AB (ref 39.0–52.0)
HEMOGLOBIN: 8.5 g/dL — AB (ref 13.0–17.0)
MCH: 30.9 pg (ref 26.0–34.0)
MCHC: 32.9 g/dL (ref 30.0–36.0)
MCV: 93.8 fL (ref 78.0–100.0)
PLATELETS: 193 10*3/uL (ref 150–400)
RBC: 2.75 MIL/uL — AB (ref 4.22–5.81)
RDW: 18.1 % — ABNORMAL HIGH (ref 11.5–15.5)
WBC: 9.4 10*3/uL (ref 4.0–10.5)

## 2017-04-13 LAB — ALT: ALT: 51 U/L (ref 17–63)

## 2017-04-13 LAB — PROTIME-INR
INR: 1.45
PROTHROMBIN TIME: 17.8 s — AB (ref 11.4–15.2)

## 2017-04-13 LAB — HEPARIN LEVEL (UNFRACTIONATED): Heparin Unfractionated: 0.28 IU/mL — ABNORMAL LOW (ref 0.30–0.70)

## 2017-04-13 MED ORDER — RENA-VITE PO TABS
1.0000 | ORAL_TABLET | Freq: Every day | ORAL | Status: DC
  Administered 2017-04-13 – 2017-04-20 (×8): 1 via ORAL
  Filled 2017-04-13 (×8): qty 1

## 2017-04-13 MED ORDER — ALTEPLASE 2 MG IJ SOLR: 2.0000 mg | Freq: Once | INTRAMUSCULAR | Status: DC | PRN

## 2017-04-13 MED ORDER — HEPARIN (PORCINE) IN NACL 100-0.45 UNIT/ML-% IJ SOLN
1250.0000 [IU]/h | INTRAMUSCULAR | Status: DC
  Administered 2017-04-13: 1100 [IU]/h via INTRAVENOUS
  Administered 2017-04-14: 1150 [IU]/h via INTRAVENOUS
  Administered 2017-04-15 (×2): 1200 [IU]/h via INTRAVENOUS
  Administered 2017-04-16 – 2017-04-17 (×2): 1250 [IU]/h via INTRAVENOUS
  Filled 2017-04-13 (×5): qty 250

## 2017-04-13 MED ORDER — PENTAFLUOROPROP-TETRAFLUOROETH EX AERO: 1.0000 "application " | INHALATION_SPRAY | CUTANEOUS | Status: DC | PRN

## 2017-04-13 MED ORDER — LIDOCAINE HCL (PF) 1 % IJ SOLN: 5.0000 mL | INTRAMUSCULAR | Status: DC | PRN

## 2017-04-13 MED ORDER — CALCIUM ACETATE (PHOS BINDER) 667 MG PO CAPS
1334.0000 mg | ORAL_CAPSULE | Freq: Three times a day (TID) | ORAL | Status: DC
  Administered 2017-04-13 – 2017-04-21 (×18): 1334 mg via ORAL
  Filled 2017-04-13 (×19): qty 2

## 2017-04-13 MED ORDER — WARFARIN - PHARMACIST DOSING INPATIENT
Freq: Every day | Status: DC
  Administered 2017-04-13: 19:00:00

## 2017-04-13 MED ORDER — FERROUS SULFATE 325 (65 FE) MG PO TABS
325.0000 mg | ORAL_TABLET | Freq: Two times a day (BID) | ORAL | Status: DC
  Administered 2017-04-13 – 2017-04-14 (×2): 325 mg via ORAL
  Filled 2017-04-13 (×2): qty 1

## 2017-04-13 MED ORDER — LEVOTHYROXINE SODIUM 50 MCG PO TABS
50.0000 ug | ORAL_TABLET | Freq: Every day | ORAL | Status: DC
  Administered 2017-04-14 – 2017-04-21 (×8): 50 ug via ORAL
  Filled 2017-04-13 (×8): qty 1

## 2017-04-13 MED ORDER — LIDOCAINE-PRILOCAINE 2.5-2.5 % EX CREA: 1.0000 "application " | TOPICAL_CREAM | CUTANEOUS | Status: DC | PRN

## 2017-04-13 MED ORDER — SODIUM CHLORIDE 0.9 % IV SOLN: 100.0000 mL | INTRAVENOUS | Status: DC | PRN

## 2017-04-13 MED ORDER — WARFARIN SODIUM 2.5 MG PO TABS
2.5000 mg | ORAL_TABLET | Freq: Once | ORAL | Status: AC
  Administered 2017-04-13: 2.5 mg via ORAL
  Filled 2017-04-13: qty 1

## 2017-04-13 MED ORDER — HEPARIN SODIUM (PORCINE) 1000 UNIT/ML DIALYSIS
1000.0000 [IU] | INTRAMUSCULAR | Status: DC | PRN
  Filled 2017-04-13: qty 1

## 2017-04-13 NOTE — Procedures (Signed)
Assessment:  1 Oliguric AKI hemodynamically mediated due to GI blood loss 2 CKD 4 3 s/p AVF on LUE 03/04/17 4 Volume overload with pl effusions, ascites and edema 4 ABLA due to GI loss 5 Gastric AVMs s/p tx 6 ? Cirrhosis by CT  Plan: First HD in progress. Add binder and MVI. I think he will likely be end stage at this time.  Cath working, hemodynamics are stable, pt w/o complaints. Will plan for another HD in AM. Joeanne Robicheaux C

## 2017-04-13 NOTE — Progress Notes (Signed)
ANTICOAGULATION CONSULT NOTE - Follow Up Consult  Pharmacy Consult for Heparin  Indication: atrial fibrillation and hx mechanical atrial valve  Allergies  Allergen Reactions  . Chantix [Varenicline] Nausea And Vomiting  . Lyrica [Pregabalin] Other (See Comments)    Swelling, couldn't breathe  . Nsaids Other (See Comments)    Cannot take while on coumadin  . Simvastatin Other (See Comments)    Pt said he had a "bleed out" after taking it  . Ziac [Bisoprolol-Hydrochlorothiazide] Other (See Comments)    fatigue    Patient Measurements: Height: 5\' 8"  (172.7 cm) Weight: 235 lb 14.3 oz (107 kg) IBW/kg (Calculated) : 68.4 Heparin Dosing Weight:92 kg  Vital Signs: Temp: 98.6 F (37 C) (06/27 1312) BP: 112/50 (06/27 1319) Pulse Rate: 75 (06/27 2028)  Labs:  Recent Labs  04/11/17 0216 04/11/17 6834 04/11/17 0911 04/11/17 1425  04/12/17 0154 04/12/17 1023 04/13/17 0714 04/13/17 0715 04/13/17 0716 04/13/17 2047  HGB  --   --  8.3*  --   < > 8.3* 8.5*  --   --  8.5*  --   HCT  --   --  25.0*  --   < > 25.2* 26.1*  --   --  25.8*  --   PLT  --   --  181  --   < > 169 185  --   --  193  --   LABPROT  --  22.3*  --   --   --  18.6*  --  17.8*  --   --   --   INR  --  1.93  --   --   --  1.54  --  1.45  --   --   --   HEPARINUNFRC  --   --   --   --   --   --   --   --   --   --  0.28*  CREATININE  --   --  10.23*  --   --  10.95*  --   --  11.80*  --   --   TROPONINI 0.03* 0.03*  --  0.05*  --   --   --   --   --   --   --   < > = values in this interval not displayed.  Estimated Creatinine Clearance: 6.5 mL/min (A) (by C-G formula based on SCr of 11.8 mg/dL (H)).   Medications:  Infusions:  . heparin 1,100 Units/hr (04/13/17 1305)    Assessment: 76 year old male on Heparin and Coumadin overlap for St. Jude's AVR and atrial fibrillation.   Initial heparin level is 0.28 on 1100 units/hr (no bolus).  No bleeding noted.   Goal of Therapy:  Heparin level 0.3 to 0.5  units/ml Monitor platelets by anticoagulation protocol: Yes   Plan:  Increase Heparin to 1150 units/hr. Follow-up AM heparin level and CBC  Sloan Leiter, PharmD, BCPS Clinical Pharmacist Clinical Phone 04/13/2017 until 11PM [phone] After hours, please call #[phone] 04/13/2017,10:02 PM

## 2017-04-13 NOTE — Progress Notes (Signed)
PT Cancellation Note  Patient Details Name: Robert Stanton MRN: [id number] DOB: 1941/05/22   Cancelled Treatment:    Reason Eval/Treat Not Completed: Patient at procedure or test/unavailable, at HD   [facility] 04/13/2017, 8:23 AM

## 2017-04-13 NOTE — Progress Notes (Signed)
OT Cancellation Note  Patient Details Name: Robert Stanton MRN: [id number] DOB: 1941/02/26   Cancelled Treatment:    Reason Eval/Treat Not Completed: Patient at procedure or test/ unavailable (HD)  Malka So 04/13/2017, 8:30 AM

## 2017-04-13 NOTE — Evaluation (Signed)
Physical Therapy Evaluation Patient Details Name: Robert Stanton MRN: [id number] DOB: 18-Apr-1941 Today's Date: 04/13/2017   History of Present Illness  Pt admitted with GIB, anemia. Started on HD 04/12/17.  PMH: CKD, chronic anemia, CAD, CABG, mechanical aortic valve, COPD, CHF, DM, afib.   Clinical Impression  Orders received for PT evaluation. Patient demonstrates deficits in functional mobility as indicated below. Will benefit from continued skilled PT to address deficits and maximize function. Will see as indicated and progress as tolerated.  Likely will need ST SNF upon acute discharge as patient reports he was ambulatory and only required minimal assist prior to admission. Currently requires +2 physical assist for all aspects of mobility.     Follow Up Recommendations SNF;Supervision/Assistance - 24 hour    Equipment Recommendations  None recommended by PT    Recommendations for Other Services       Precautions / Restrictions Precautions Precautions: Fall Restrictions Weight Bearing Restrictions: No      Mobility  Bed Mobility Overal bed mobility: Needs Assistance Bed Mobility: Supine to Sit     Supine to sit: +2 for physical assistance;Mod assist     General bed mobility comments: assist for LEs to EOB, to raise trunk and advance hips to EOB with pad  Transfers Overall transfer level: Needs assistance Equipment used: Rolling walker (2 wheeled) Transfers: Sit to/from [facility] Sit to Stand: +2 physical assistance;Mod assist;From elevated surface Stand pivot transfers: +2 physical assistance;Min assist       General transfer comment: cues for hand placement, assist to rise and translate weight anterior  Ambulation/Gait             General Gait Details: unable to perform today (first HD session, limited with fatigue)  Stairs            Wheelchair Mobility    Modified Rankin (Stroke Patients Only)       Balance Overall  balance assessment: Needs assistance;History of Falls   Sitting balance-Leahy Scale: Poor   Postural control: Posterior lean   Standing balance-Leahy Scale: Poor Standing balance comment: posterior lean initially                             Pertinent Vitals/Pain Pain Assessment: No/denies pain    Home Living Family/patient expects to be discharged to:: Private residence Living Arrangements: Spouse/significant other;Children Available Help at Discharge: Family Type of Home: Mobile home Home Access: Ramped entrance     Home Layout: One level Home Equipment: Environmental consultant - 2 wheels;Walker - 4 wheels;Cane - single point;Shower seat;Wheelchair - manual      Prior Function Level of Independence: Needs assistance   Gait / Transfers Assistance Needed: walks with a cane  ADL's / Homemaking Assistance Needed: able to perform self care, but reports struggling with LB bathing and dressing, sits to shower, assisted for IADL        Hand Dominance   Dominant Hand: Right    Extremity/Trunk Assessment   Upper Extremity Assessment Upper Extremity Assessment: Generalized weakness;RUE deficits/detail RUE Deficits / Details: tremor, pt reports is not new but is worsening RUE Coordination: decreased fine motor;decreased gross motor    Lower Extremity Assessment Lower Extremity Assessment: Generalized weakness (bilateral LE foot wounds (heel), arthritic limitations )       Communication   Communication: HOH  Cognition Arousal/Alertness: Awake/alert Behavior During Therapy: WFL for tasks assessed/performed Overall Cognitive Status: Difficult to assess  General Comments      Exercises     Assessment/Plan    PT Assessment Patient needs continued PT services  PT Problem List Decreased strength;Decreased activity tolerance;Decreased balance;Decreased mobility;Decreased cognition;Decreased safety  awareness;Obesity;Pain       PT Treatment Interventions DME instruction;Gait training;Functional mobility training;Therapeutic activities;Therapeutic exercise;Balance training;Patient/family education    PT Goals (Current goals can be found in the Care Plan section)  Acute Rehab PT Goals Patient Stated Goal: did not state PT Goal Formulation: With patient Time For Goal Achievement: 04/27/17 Potential to Achieve Goals: Good    Frequency Min 3X/week   Barriers to discharge        Co-evaluation PT/OT/SLP Co-Evaluation/Treatment: Yes Reason for Co-Treatment: For patient/therapist safety PT goals addressed during session: Mobility/safety with mobility OT goals addressed during session: ADL's and self-care       AM-PAC PT "6 Clicks" Daily Activity  Outcome Measure Difficulty turning over in bed (including adjusting bedclothes, sheets and blankets)?: Total Difficulty moving from lying on back to sitting on the side of the bed? : Total Difficulty sitting down on and standing up from a chair with arms (e.g., wheelchair, bedside commode, etc,.)?: Total Help needed moving to and from a bed to chair (including a wheelchair)?: A Lot Help needed walking in hospital room?: A Lot Help needed climbing 3-5 steps with a railing? : Total 6 Click Score: 8    End of Session Equipment Utilized During Treatment: Gait belt Activity Tolerance: Patient limited by fatigue Patient left: in chair;with call bell/phone within reach;with chair alarm set Nurse Communication: Mobility status PT Visit Diagnosis: Unsteadiness on feet (R26.81);Muscle weakness (generalized) (M62.81);History of falling (Z91.81)    Time: 4239-5320 PT Time Calculation (min) (ACUTE ONLY): 26 min   Charges:   PT Evaluation $PT Eval Moderate Complexity: 1 Procedure     PT G Codes:        Alben Deeds, PT DPT [facility] [phone]   Duncan Dull 04/13/2017, 1:33 PM

## 2017-04-13 NOTE — Progress Notes (Addendum)
PROGRESS NOTE    RENDON HOWELL  WPY:[id number] DOB: 1941/07/01 DOA: 04/10/2017 PCP: Unk Pinto, MD   Chief Complaint  Patient presents with  . Nausea  . Dizziness    Brief Narrative:  HPI on 04/10/2017 by Dr. Jenetta Downer OLAND ARQUETTE is a 76 y.o. male with medical history significant for- hypertension, coronary artery disease- CABG- 2010,  atrial fibrillation CKD5, hypertension, esophageal strictures, gout, COPD, OSA, mechanical aortic valve, hypothyroidism, peripheral artery disease, type II DM. Patient came in with complaints of bleeding from his rectum was started about 3 days ago, he also reports dark stools- 3-4 days. He denies abdominal pain. In the ED he had an episode of vomiting- with coffee-ground emesis.  He endorses dizziness that started Saturday morning- 04/09/17. This appears to be a chronic issue for patient. Pt denies NSAID use. Patient reports a history of peptic ulcer disease diagnosed with EGD- several years ago. Last EGD 10/2012- was esophageal stricture and erosions, erosive gastritis. Last colonoscopy was about 10 years ago- reports it was normal. He also notes a chronic cough that is productive today, though he has a history of COPD. His shortness of breath is at baseline. He denies fever or chills.  Interim history Patient was found to have heme positive stools, with increasing creatinine and elevated INR. He was admitted for evaluation of GI bleeding. Patient underwent EGD showing AVMs status post hemostatic endoscopy with APC. Nephrology also consulted and hemodialysis initiated.  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 Esophageal stricture without esophagitis. Moderately large hiatal hernia with [name] erosions. -Per gastroenterology, patient may resume  anticoagulation -Hemoglobin currently 8.5 (baseline hemoglobin 8-9) -Continue to monitor CBC -Will start on supplementation 325mg  BID -Per GI recommendations, Protonix indefinitely and iron supplementation   Acute renal failure on chronic kidney disease, stage IV with oliguria -Creatinine continued to worsen, peaked at 11.8 -Nephrology consult and appreciated, hemodialysis initiated -Patient was given IV diuresis however remained oliguric with fluid overload -Intervaginal radiology consulted and appreciated for HD catheter placement -Of note, patient did have aVF on left upper extremity 03/04/2017 -Placed on phoslo, renavit  Acute on Chronic systolic heart failure -Echocardiogram pending -Last echocardiogram 05/20/2016 showed an EF of 45%, basal to mid inferior severe hypokinesis. Indeterminate diastolic function -Patient was given diuresis per nephrology however patient continued to remain oliguric -Monitor intake and output, daily weights  Right loculated pleural effusion -No signs of an infection at this time -CT chest: Right-sided calcified pleural plaque, nonspecific with differential including asbestosis related pleural disease versus pleural insult such as trauma or infection. Small bilateral pleural effusions, probably loculated bilaterally with small loculated components. Right major fissure.  Atrial fibrillation/aortic valve replacement -Patient presented with supratherapeutic INR, given vitamin K as well as fresh frozen plasma -INR currently 1.45 -Will restart Coumadin this evening and continue to monitor INR -will bridge with heparin (conservatively) given UGI bleed. Monitor H/h closely   Essential hypertension -BP currently stable, was hypotensive -Continue to monitor closely and start IV medications as needed  Diabetes mellitus, type II -Hemoglobin A1c was 5.1 on 03/05/2017  Atherosclerotic thoracic aorta -4.3 cm ectatic ascending thoracic aorta noted on CT  scan -Patient will need annual imaging by CTA or MRA  Possible cirrhosis -Noted on CT chest -Patient noted to have mildly increased AST, 69 -Will order CMP for tomorrow morning   Mildly  elevated troponin -Likely secondary to the above, currently chest pain-free -Troponin elevation not consistent with ACS  Hypothyroidism -Continue Synthroid  DVT Prophylaxis  SCDs  Code Status: Full  Family Communication: None at bedside  Disposition Plan: Admitted. Dispo pending   Consultants Nephrology Gastroenterology Interventional radiology   Procedures  EGD  Right IJ HD tunneled catheter placement  Antibiotics   Anti-infectives    Start     Dose/Rate Route Frequency Ordered Stop   04/12/17 1407  ceFAZolin (ANCEF) 2-4 GM/100ML-% IVPB    Comments:  Dhers, Patricia   : cabinet override      04/12/17 1407 04/13/17 0214   04/12/17 1100  ceFAZolin (ANCEF) IVPB 2g/100 mL premix     2 g 200 mL/hr over 30 Minutes Intravenous On call 04/12/17 1019 04/12/17 1443      Subjective:   Bonner Puna seen and examined today in dialysis. Patient complains of being thirsty and hungry. Would like to drink some water. Currently denies any chest pain, shortness of breath, abdominal pain, nausea or vomiting, diarrhea or constipation.   Objective:   Vitals:   04/13/17 0800 04/13/17 0900 04/13/17 0930 04/13/17 0953  BP: (!) 118/59 (!) 115/55 (!) 110/50 (!) 114/57  Pulse: 78 81 80 80  Resp:    18  Temp:    98.2 F (36.8 C)  TempSrc:    Oral  SpO2:    98%  Weight:    107 kg (235 lb 14.3 oz)  Height:        Intake/Output Summary (Last 24 hours) at 04/13/17 1142 Last data filed at 04/13/17 0953  Gross per 24 hour  Intake           428.42 ml  Output             2500 ml  Net         -2071.58 ml   Filed Weights   04/13/17 0500 04/13/17 0653 04/13/17 0953  Weight: 112 kg (247 lb) 109.6 kg (241 lb 10 oz) 107 kg (235 lb 14.3 oz)   Exam  General: Well developed, ill-appearing,  NAD  HEENT: NCAT, mucous membranes dry  Cardiovascular: S1 S2 auscultated, irregular  Respiratory: Diminished breath sounds  Abdomen: Soft, nontender, nondistended, + bowel sounds  Extremities: warm dry without cyanosis clubbing. LE edemea 2+  Neuro: AAOx3, nonfocal   Psych: Appropriate and pleasant  Data Reviewed: I have personally reviewed following labs and imaging studies  CBC:  Recent Labs Lab 04/11/17 0911 04/11/17 1835 04/12/17 0154 04/12/17 1023 04/13/17 0716  WBC 8.0 8.2 8.0 9.6 9.4  HGB 8.3* 8.2* 8.3* 8.5* 8.5*  HCT 25.0* 24.3* 25.2* 26.1* 25.8*  MCV 93.3 93.1 93.0 93.5 93.8  PLT 181 178 169 185 676   Basic Metabolic Panel:  Recent Labs Lab 04/10/17 2003 04/11/17 0911 04/12/17 0154 04/13/17 0715  NA 130* 131* 132* 133*  K 3.6 3.9 4.0 4.3  CL 92* 92* 93* 92*  CO2 22 21* 21* 22  GLUCOSE 116* 111* 129* 118*  BUN 138* 141* 144* 155*  CREATININE 9.90* 10.23* 10.95* 11.80*  CALCIUM 8.7* 8.5* 8.4* 8.0*  PHOS  --   --  7.0* 8.2*   GFR: Estimated Creatinine Clearance: 6.5 mL/min (A) (by C-G formula based on SCr of 11.8 mg/dL (H)). Liver Function Tests:  Recent Labs Lab 04/10/17 2003 04/12/17 0154 04/13/17 0714 04/13/17 0715  AST 69*  --   --   --   ALT 26  --  51  --  ALKPHOS 69  --   --   --   BILITOT 1.8*  --   --   --   PROT 7.3  --   --   --   ALBUMIN 2.8* 2.9*  --  2.9*    Recent Labs Lab 04/10/17 2046  LIPASE 45   No results for input(s): AMMONIA in the last 168 hours. Coagulation Profile:  Recent Labs Lab 04/10/17 2046 04/11/17 0646 04/12/17 0154 04/13/17 0714  INR 4.34* 1.93 1.54 1.45   Cardiac Enzymes:  Recent Labs Lab 04/11/17 0216 04/11/17 0646 04/11/17 1425  TROPONINI 0.03* 0.03* 0.05*   BNP (last 3 results) No results for input(s): PROBNP in the last 8760 hours. HbA1C: No results for input(s): HGBA1C in the last 72 hours. CBG: No results for input(s): GLUCAP in the last 168 hours. Lipid Profile: No  results for input(s): CHOL, HDL, LDLCALC, TRIG, CHOLHDL, LDLDIRECT in the last 72 hours. Thyroid Function Tests: No results for input(s): TSH, T4TOTAL, FREET4, T3FREE, THYROIDAB in the last 72 hours. Anemia Panel: No results for input(s): VITAMINB12, FOLATE, FERRITIN, TIBC, IRON, RETICCTPCT in the last 72 hours. Urine analysis:    Component Value Date/Time   COLORURINE YELLOW 09/26/2016 1939   APPEARANCEUR CLEAR 09/26/2016 1939   LABSPEC 1.005 09/26/2016 1939   PHURINE 5.0 09/26/2016 1939   GLUCOSEU NEGATIVE 09/26/2016 1939   HGBUR MODERATE (A) 09/26/2016 1939   BILIRUBINUR NEGATIVE 09/26/2016 1939   KETONESUR NEGATIVE 09/26/2016 1939   PROTEINUR NEGATIVE 09/26/2016 1939   NITRITE NEGATIVE 09/26/2016 1939   LEUKOCYTESUR NEGATIVE 09/26/2016 1939   Sepsis Labs: @LABRCNTIP (procalcitonin:4,lacticidven:4)  )No results found for this or any previous visit (from the past 240 hour(s)).    Radiology Studies: Ct Chest Wo Contrast  Result Date: 04/12/2017 CLINICAL DATA:  Inpatient.  Chronic productive cough. EXAM: CT CHEST WITHOUT CONTRAST TECHNIQUE: Multidetector CT imaging of the chest was performed following the standard protocol without IV contrast. COMPARISON:  Chest radiograph from one day prior. 03/02/2013 chest CT. FINDINGS: Cardiovascular: Mild cardiomegaly. No significant pericardial fluid/thickening. Aortic valvular prosthesis is in place. Left main, left anterior descending, left circumflex and right coronary atherosclerosis status post CABG. Atherosclerotic thoracic aorta with 4.3 cm ectatic ascending thoracic aorta. Top-normal caliber main pulmonary artery (3.1 cm diameter). Mediastinum/Nodes: No discrete thyroid nodules. Unremarkable esophagus. No axillary adenopathy. Mild right paratracheal adenopathy measuring up to 1.0 cm (series 3/ image 29). No additional pathologically enlarged mediastinal or gross hilar nodes on this noncontrast scan. Lungs/Pleura: No pneumothorax. Calcified  pleural plaque in the medial posterior right pleural space. No left-sided calcified pleural plaques. There are small dependent bilateral pleural effusions, probably loculated bilaterally with small loculated component in the superior portion of the right major fissure (series 3/image 80). Mild paraseptal emphysema. Mild-to-moderate compressive atelectasis in the bilateral lower lobes. No lung masses or significant pulmonary nodules in the aerated portions of the lungs. Upper abdomen: Small hiatal hernia. Liver surface appears finely irregular, cannot exclude cirrhosis. Small volume ascites in the visualized upper abdomen. Multiple simple appearing renal cysts in the upper kidneys bilaterally, largest 5.7 cm in the posterior upper right kidney. Musculoskeletal: No aggressive appearing focal osseous lesions. Moderate thoracic spondylosis. Sternotomy wires appear intact. Moderate anasarca. IMPRESSION: 1. Right-sided calcified pleural plaque, which is nonspecific with the differential including asbestos related pleural disease (usually bilateral) versus remote pleural insult (trauma or infection). 2. Small dependent bilateral pleural effusions, probably loculated bilaterally with small loculated component in the superior right major fissure. 3. Mild cardiomegaly. 4.  Possible cirrhosis.  Small volume upper abdominal ascites. 5. Moderate anasarca. 6. 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 Recommend annual imaging followup by CTA or MRA. This recommendation follows 2010 ACCF/AHA/AATS/ACR/ASA/SCA/SCAI/SIR/STS/SVM Guidelines for the Diagnosis and Management of Patients with Thoracic Aortic Disease. Circulation. 2010; 121: I948-N462. 7. Mild mediastinal lymphadenopathy, nonspecific, probably reactive. 8. Small hiatal hernia. Aortic Atherosclerosis (ICD10-I70.0) and Emphysema (ICD10-J43.9). Electronically Signed   By: Ilona Sorrel M.D.   On: 04/12/2017 09:21   Ir Fluoro Guide Cv Line Right  Result Date:  04/12/2017 INDICATION: Acute on chronic renal failure, no current access for dialysis EXAM: ULTRASOUND GUIDANCE FOR VASCULAR ACCESS RIGHT INTERNAL JUGULAR PERMANENT HEMODIALYSIS CATHETER Date:  6/26/20186/26/2018 3:00 pm Radiologist:  M. Daryll Brod, MD Guidance:  Ultrasound and fluoroscopic FLUOROSCOPY TIME:  Fluoroscopy Time: 43 seconds (10 mGy). MEDICATIONS: Ancef 2 g administered within 1 hour of the procedure ANESTHESIA/SEDATION: Versed 0.5 mg IV; Fentanyl 12.5 mcg IV; Moderate Sedation Time:  26 minutes The patient was continuously monitored during the procedure by the interventional radiology nurse under my direct supervision. CONTRAST:  None. COMPLICATIONS: None immediate. PROCEDURE: Informed consent was obtained from the patient following explanation of the procedure, risks, benefits and alternatives. The patient understands, agrees and consents for the procedure. All questions were addressed. A time out was performed. Maximal barrier sterile technique utilized including caps, mask, sterile gowns, sterile gloves, large sterile drape, hand hygiene, and 2% chlorhexidine scrub. Under sterile conditions and local anesthesia, right internal jugular micropuncture venous access was performed with ultrasound. Images were obtained for documentation. A guide wire was inserted followed by a transitional dilator. Next, a 0.035 guidewire was advanced into the IVC with a 5-French catheter. Measurements were obtained from the right venotomy site to the proximal right atrium. In the right infraclavicular chest, a subcutaneous tunnel was created under sterile conditions and local anesthesia. 1% lidocaine with epinephrine was utilized for this. The 23 cm tip to cuff palindrome catheter was tunneled subcutaneously to the venotomy site and inserted into the SVC/RA junction through a valved peel-away sheath. Position was confirmed with fluoroscopy. Images were obtained for documentation. Blood was aspirated from the catheter  followed by saline and heparin flushes. The appropriate volume and strength of heparin was instilled in each lumen. Caps were applied. The catheter was secured at the tunnel site with Gelfoam and a pursestring suture. The venotomy site was closed with subcuticular Vicryl suture. Dermabond was applied to the small right neck incision. A dry sterile dressing was applied. The catheter is ready for use. No immediate complications. IMPRESSION: Ultrasound and fluoroscopically guided right internal jugular tunneled hemodialysis catheter (23 cm tip to cuff palindrome catheter). Electronically Signed   By: Jerilynn Mages.  Shick M.D.   On: 04/12/2017 15:08   Ir US Guide Vasc Access Right  Result Date: 04/12/2017 INDICATION: Acute on chronic renal failure, no current access for dialysis EXAM: ULTRASOUND GUIDANCE FOR VASCULAR ACCESS RIGHT INTERNAL JUGULAR PERMANENT HEMODIALYSIS CATHETER Date:  6/26/20186/26/2018 3:00 pm Radiologist:  M. Daryll Brod, MD Guidance:  Ultrasound and fluoroscopic FLUOROSCOPY TIME:  Fluoroscopy Time: 43 seconds (10 mGy). MEDICATIONS: Ancef 2 g administered within 1 hour of the procedure ANESTHESIA/SEDATION: Versed 0.5 mg IV; Fentanyl 12.5 mcg IV; Moderate Sedation Time:  26 minutes The patient was continuously monitored during the procedure by the interventional radiology nurse under my direct supervision. CONTRAST:  None. COMPLICATIONS: None immediate. PROCEDURE: Informed consent was obtained from the patient following explanation of the procedure, risks, benefits and alternatives. The patient understands, agrees and consents  for the procedure. All questions were addressed. A time out was performed. Maximal barrier sterile technique utilized including caps, mask, sterile gowns, sterile gloves, large sterile drape, hand hygiene, and 2% chlorhexidine scrub. Under sterile conditions and local anesthesia, right internal jugular micropuncture venous access was performed with ultrasound. Images were obtained for  documentation. A guide wire was inserted followed by a transitional dilator. Next, a 0.035 guidewire was advanced into the IVC with a 5-French catheter. Measurements were obtained from the right venotomy site to the proximal right atrium. In the right infraclavicular chest, a subcutaneous tunnel was created under sterile conditions and local anesthesia. 1% lidocaine with epinephrine was utilized for this. The 23 cm tip to cuff palindrome catheter was tunneled subcutaneously to the venotomy site and inserted into the SVC/RA junction through a valved peel-away sheath. Position was confirmed with fluoroscopy. Images were obtained for documentation. Blood was aspirated from the catheter followed by saline and heparin flushes. The appropriate volume and strength of heparin was instilled in each lumen. Caps were applied. The catheter was secured at the tunnel site with Gelfoam and a pursestring suture. The venotomy site was closed with subcuticular Vicryl suture. Dermabond was applied to the small right neck incision. A dry sterile dressing was applied. The catheter is ready for use. No immediate complications. IMPRESSION: Ultrasound and fluoroscopically guided right internal jugular tunneled hemodialysis catheter (23 cm tip to cuff palindrome catheter). Electronically Signed   By: Jerilynn Mages.  Shick M.D.   On: 04/12/2017 15:08     Scheduled Meds: . albuterol  2.5 mg Nebulization TID  . calcium acetate  1,334 mg Oral TID WC  . levothyroxine  25 mcg Intravenous Daily  . montelukast  10 mg Oral QHS  . multivitamin  1 tablet Oral QHS  . pantoprazole  40 mg Oral Q0600   Continuous Infusions:    LOS: 3 days   Time Spent in minutes   40 minutes  Delinda Malan D.O. on 04/13/2017 at 11:42 AM  Between 7am to 7pm - Pager - [phone]  After 7pm go to [email or web address] - password [email or web address]  And look for the night coverage person covering for me after hours  [facility] Office  [phone]

## 2017-04-13 NOTE — Consult Note (Signed)
           [facility] CM Primary Care Navigator  04/13/2017  Robert Stanton 1941/03/11 [id number]   Went to see patient at the bedside to identify possible discharge needs but staff members are in the room assisting patient (providing patient care) at this time.  Will meet with patient at another time when available.   For questions, please contact:  Dannielle Huh, BSN, RN- [facility] Primary Care Navigator  Telephone: [phone] [place]

## 2017-04-13 NOTE — Discharge Instructions (Signed)

## 2017-04-13 NOTE — Procedures (Signed)
Patient is nauseated and feels he may vomit. Will not place patient on CPAP at this time. RN aware.

## 2017-04-13 NOTE — Evaluation (Signed)
Occupational Therapy Evaluation Patient Details Name: Robert Stanton MRN: [id number] DOB: [birth year] Today's Date: 04/13/2017    History of Present Illness Pt admitted with GIB, anemia. Started on HD 04/12/17.  PMH: CKD, chronic anemia, CAD, CABG, mechanical aortic valve, COPD, CHF, DM, afib.    Clinical Impression   Pt was walking with a cane and able to perform self care at a modified independent level prior to admission. He reports difficulty managing LB bathing and dressing. Pt presents with generalized weakness, poor activity tolerance, decreased sitting and standing balance and R UE tremor which he reports is worsening. Pt requires min to total assist for ADL and 2 person assist for mobility. Recommending post acute rehab in SNF prior to return home.    Follow Up Recommendations  SNF;Supervision/Assistance - 24 hour    Equipment Recommendations       Recommendations for Other Services       Precautions / Restrictions Precautions Precautions: Fall Restrictions Weight Bearing Restrictions: No      Mobility Bed Mobility Overal bed mobility: Needs Assistance Bed Mobility: Supine to Sit     Supine to sit: +2 for physical assistance;Mod assist     General bed mobility comments: assist for LEs to EOB, to raise trunk and advance hips to EOB with pad  Transfers Overall transfer level: Needs assistance Equipment used: Rolling walker (2 wheeled) Transfers: Sit to/from [facility] Sit to Stand: +2 physical assistance;Mod assist;From elevated surface Stand pivot transfers: +2 physical assistance;Min assist       General transfer comment: cues for hand placement, assist to rise and translate weight anterior    Balance Overall balance assessment: Needs assistance   Sitting balance-Leahy Scale: Poor   Postural control: Posterior lean   Standing balance-Leahy Scale: Poor Standing balance comment: posterior lean initially                            ADL either performed or assessed with clinical judgement   ADL Overall ADL's : Needs assistance/impaired Eating/Feeding: Set up;Sitting Eating/Feeding Details (indicate cue type and reason): slow, incoordination Grooming: Wash/dry hands;Moderate assistance;Sitting   Upper Body Bathing: Maximal assistance;Sitting   Lower Body Bathing: Total assistance;Sit to/from stand;+2 for physical assistance   Upper Body Dressing : Moderate assistance;Sitting   Lower Body Dressing: Total assistance;+2 for physical assistance;Sit to/from stand   Toilet Transfer: Minimal assistance;+2 for physical assistance;RW;BSC;Stand-pivot   Toileting- Clothing Manipulation and Hygiene: Total assistance;+2 for physical assistance;Sit to/from stand         General ADL Comments: Pt with wheezing sounds, worse in supine, unable to get 02 reading.     Vision Patient Visual Report: No change from baseline       Perception     Praxis      Pertinent Vitals/Pain Pain Assessment: No/denies pain     Hand Dominance Right   Extremity/Trunk Assessment Upper Extremity Assessment Upper Extremity Assessment: Generalized weakness;RUE deficits/detail RUE Deficits / Details: tremor, pt reports is not new but is worsening RUE Coordination: decreased fine motor;decreased gross motor   Lower Extremity Assessment Lower Extremity Assessment: Defer to PT evaluation       Communication Communication Communication: HOH   Cognition Arousal/Alertness: Awake/alert Behavior During Therapy: WFL for tasks assessed/performed Overall Cognitive Status: Difficult to assess  General Comments       Exercises     Shoulder Instructions      Home Living Family/patient expects to be discharged to:: Private residence Living Arrangements: Spouse/significant other;Children Available Help at Discharge: Family Type of Home: Mobile home Home Access: Ramped  entrance     [place]: One level     Bathroom Shower/Tub: Occupational psychologist: [place]: Environmental consultant - 2 wheels;Walker - 4 wheels;Cane - single point;Shower seat;Wheelchair - manual          Prior Functioning/Environment Level of Independence: Needs assistance  Gait / Transfers Assistance Needed: walks with a cane ADL's / Homemaking Assistance Needed: able to perform self care, but reports struggling with LB bathing and dressing, sits to shower, assisted for IADL            OT Problem List: Decreased strength;Decreased activity tolerance;Impaired balance (sitting and/or standing);Decreased coordination;Decreased knowledge of use of DME or AE;Obesity;Impaired UE functional use;Cardiopulmonary status limiting activity      OT Treatment/Interventions: Self-care/ADL training;DME and/or AE instruction;Balance training;Patient/family education;Therapeutic activities    OT Goals(Current goals can be found in the care plan section) Acute Rehab OT Goals Patient Stated Goal: did not state OT Goal Formulation: With patient Time For Goal Achievement: 04/27/17 Potential to Achieve Goals: Good ADL Goals Pt Will Perform Grooming: with set-up;sitting Pt Will Perform Upper Body Dressing: with min assist;sitting Pt Will Perform Lower Body Dressing: with mod assist;with adaptive equipment;sit to/from stand Pt Will Transfer to Toilet: with min assist;ambulating;bedside commode Pt Will Perform Toileting - Clothing Manipulation and hygiene: with min assist;sit to/from stand Additional ADL Goal #1: Pt will perform bed mobility with moderate assistance. Additional ADL Goal #2: Pt will utilize energy conservation strategies during ADL and mobility with min verbal cues.  OT Frequency: Min 2X/week   Barriers to D/C:            Co-evaluation PT/OT/SLP Co-Evaluation/Treatment: Yes Reason for Co-Treatment: For patient/therapist safety   OT goals addressed during  session: ADL's and self-care      AM-PAC PT "6 Clicks" Daily Activity     Outcome Measure Help from another person eating meals?: A Little Help from another person taking care of personal grooming?: A Lot Help from another person toileting, which includes using toliet, bedpan, or urinal?: Total Help from another person bathing (including washing, rinsing, drying)?: A Lot Help from another person to put on and taking off regular upper body clothing?: A Lot Help from another person to put on and taking off regular lower body clothing?: Total 6 Click Score: 11   End of Session Equipment Utilized During Treatment: Gait belt;Rolling walker Nurse Communication: Mobility status  Activity Tolerance: Patient limited by fatigue Patient left: in chair;with call bell/phone within reach;with chair alarm set  OT Visit Diagnosis: Unsteadiness on feet (R26.81);History of falling (Z91.81);Muscle weakness (generalized) (M62.81)                Time: 2330-0762 OT Time Calculation (min): 26 min Charges:  OT General Charges $OT Visit: 1 Procedure OT Evaluation $OT Eval Moderate Complexity: 1 Procedure G-Codes:     Malka So 04/13/2017, 12:19 PM  [phone]

## 2017-04-13 NOTE — Progress Notes (Signed)
Report given to Hemodialysis RN. ?

## 2017-04-13 NOTE — Progress Notes (Addendum)
ANTICOAGULATION CONSULT NOTE - Initial Consult  Pharmacy Consult for Heparin and Coumadin Indication: atrial fibrillation and hx mechanical atrial valve  Allergies  Allergen Reactions  . Chantix [Varenicline] Nausea And Vomiting  . Lyrica [Pregabalin] Other (See Comments)    Swelling, couldn't breathe  . Nsaids Other (See Comments)    Cannot take while on coumadin  . Simvastatin Other (See Comments)    Pt said he had a "bleed out" after taking it  . Ziac [Bisoprolol-Hydrochlorothiazide] Other (See Comments)    fatigue    Patient Measurements: Height: 5\' 8"  (172.7 cm) Weight: 235 lb 14.3 oz (107 kg) IBW/kg (Calculated) : 68.4 Heparin Dosing Weight: 92 kg  Vital Signs: Temp: 98.2 F (36.8 C) (06/27 0953) Temp Source: Oral (06/27 0953) BP: 114/57 (06/27 0953) Pulse Rate: 80 (06/27 0953)  Labs:  Recent Labs  04/11/17 0216 04/11/17 5176 04/11/17 0911 04/11/17 1425  04/12/17 0154 04/12/17 1023 04/13/17 0714 04/13/17 0715 04/13/17 0716  HGB  --   --  8.3*  --   < > 8.3* 8.5*  --   --  8.5*  HCT  --   --  25.0*  --   < > 25.2* 26.1*  --   --  25.8*  PLT  --   --  181  --   < > 169 185  --   --  193  LABPROT  --  22.3*  --   --   --  18.6*  --  17.8*  --   --   INR  --  1.93  --   --   --  1.54  --  1.45  --   --   CREATININE  --   --  10.23*  --   --  10.95*  --   --  11.80*  --   TROPONINI 0.03* 0.03*  --  0.05*  --   --   --   --   --   --   < > = values in this interval not displayed.  Estimated Creatinine Clearance: 6.5 mL/min (A) (by C-G formula based on SCr of 11.8 mg/dL (H)).   Medical History: Past Medical History:  Diagnosis Date  . AAA (abdominal aortic aneurysm) ([place])   . Arthritis   . CAD (coronary artery disease)    a. CABG '00. b. all SVGs occluded 2010. c. low risk Nuc 2012   . Cataracts, bilateral   . Chronic anticoagulation   . CKD (chronic kidney disease), stage III   . COPD (chronic obstructive pulmonary disease) ([place])   . GERD  (gastroesophageal reflux disease)   . Gout   . Heart murmur   . Hiatal hernia   . History of Doppler ultrasound 04/05/2013   LEAs; small distal abd. aoritc aneuysm is stable; fem-pop graft to R leg has 50-69% blockage  . History of echocardiogram 04/17/2013   EF 40-45%; LV hypertrophy; LV mild-mod dilated; AV regurg.; LA severely diated, RA mildly dilated  . History of nuclear stress test 05/14/2011   Dipyridamole; moderate perfusion defect in Basal Infrioer & Mid Inferior region - consistent w/infarct or scar; global LV systolic function mildly reduced; EKG negative for ischemia; low risk scan  . HOH (hard of hearing)   . Hyperlipidemia   . Hypertension   . Hypothyroidism   . Ischemic cardiomyopathy   . Kidney cysts   . Mechanical heart valve present    a. Hx mechanical St Jude AVR 2000.  [name] OSA (obstructive sleep  apnea)    wears CPAP  . Peripheral arterial disease ([place])    a. s/p aorto bifem BG;  b. 11/2013 PVA distal R Fem-fem stenosis;  c. 12/2013 PTA of dist R femoral bypass graft.  . Permanent atrial fibrillation ([place])   . Prediabetes   . Ventricular tachycardia (paroxysmal) ([place])    Seen by Dr. Lovena Le with EP, started on amiodarone  . Vitamin B deficiency    Assessment:   76 yr old male on Coumadin prior to admission for hx St. Jude's AVR and afib.   Admitted 6/24 with GI bleed.  INR was 4.34.  Coumadin reversed with Vitamin K 10 mg IV and 4 units FFP.  S/p EGD on 6/25.  Bleeding gastric AVM successfully treated. Noted large hiatal hernia with [name] erosions. GI notes expect chronic low-grade oozing over time. On Protonix 40 mg daily, which is to continue indefinitely, as well as oral iron BID.  Off ASA 81 mg.   Off Amiodarone, which may also effect Coumadin requirements.     To resume Coumadin today with heparin bridge. INR down to 1.45.  Discussed with Dr. Ree Kida.  Conservative dosing for now, no heparin boluses.   Also discussed with patient. He will report any bleeding.  Noted  cirrhosis per CT. AST slightly elevated on admit. Albumin 2.9.      Most recent Coumadin regimen:  2.5 mg daily except none on Mondays and Thursdays.  Last outpatient INR was 3.3 on 04/05/17 on 2.5 mg daily. Regimen adjusted that day to skip Coumadin 2 days a week. Confirmed with Dr. Idell Pickles office.    New to hemodialysis. First session today.    Goal of Therapy:  INR 2-3 Heparin level 0.3-0.5 units/ml, lower end of therapeutic range Monitor platelets by anticoagulation protocol: Yes   Plan:   Heparin drip to begin at 1100 units/hr (~12 units/kg adjusted body weight/hr)  Heparin level ~8 hrs after drip begins.  Coumadin 2.5 mg x 1 today.  Daily heparin level, CBC, PT/INR.  Monitor for any bleeding.  Informed patient that iron can make stools dark.  Arty Baumgartner, [place] Pager: [phone] 04/13/2017,12:31 PM

## 2017-04-14 ENCOUNTER — Inpatient Hospital Stay (HOSPITAL_COMMUNITY): Payer: PPO

## 2017-04-14 DIAGNOSIS — I509 Heart failure, unspecified: Secondary | ICD-10-CM

## 2017-04-14 LAB — ECHOCARDIOGRAM COMPLETE
AO mean calculated velocity dopler: 187 cm/s
AOPV: 0.38 m/s
AOVTI: 45.6 cm
AV Area VTI index: 0.31 cm2/m2
AV Area VTI: 0.67 cm2
AV Area mean vel: 0.62 cm2
AV VEL mean LVOT/AV: 0.35
AV pk vel: 288 cm/s
AVA: 0.72 cm2
AVAREAMEANVIN: 0.27 cm2/m2
AVG: 16 mmHg
AVPG: 33 mmHg
AVPHT: 212 ms
CHL CUP AV PEAK INDEX: 0.29
CHL CUP AV VALUE AREA INDEX: 0.31
CHL CUP AV VEL: 0.72
CHL CUP RV SYS PRESS: 37 mmHg
E decel time: 215 msec
E/e' ratio: 14.86
FS: 20 % — AB (ref 28–44)
HEIGHTINCHES: 68 in
IVS/LV PW RATIO, ED: 1.17
LA vol index: 50.8 mL/m2
LADIAMINDEX: 2.34 cm/m2
LASIZE: 54 mm
LAVOL: 117 mL
LAVOLA4C: 90.8 mL
LEFT ATRIUM END SYS DIAM: 54 mm
LV PW d: 10.7 mm — AB (ref 0.6–1.1)
LVEEAVG: 14.86
LVEEMED: 14.86
LVELAT: 8.95 cm/s
LVOT SV: 33 mL
LVOT VTI: 18.5 cm
LVOT area: 1.77 cm2
LVOT diameter: 15 mm
LVOT peak vel: 109 cm/s
LVOTVTI: 0.41 cm
Lateral S' vel: 5.29 cm/s
MV Dec: 215
MV VTI: 128 cm
MVPG: 7 mmHg
MVPKEVEL: 133 m/s
RV TAPSE: 10.1 mm
Reg peak vel: 236 cm/s
TDI e' lateral: 8.95
TDI e' medial: 5.57
TR max vel: 236 cm/s
Weight: 3774.28 oz

## 2017-04-14 LAB — COMPREHENSIVE METABOLIC PANEL
ALK PHOS: 63 U/L (ref 38–126)
ALT: 39 U/L (ref 17–63)
ANION GAP: 17 — AB (ref 5–15)
AST: 126 U/L — ABNORMAL HIGH (ref 15–41)
Albumin: 2.9 g/dL — ABNORMAL LOW (ref 3.5–5.0)
BILIRUBIN TOTAL: 3.9 mg/dL — AB (ref 0.3–1.2)
BUN: 111 mg/dL — ABNORMAL HIGH (ref 6–20)
CALCIUM: 8.3 mg/dL — AB (ref 8.9–10.3)
CO2: 21 mmol/L — ABNORMAL LOW (ref 22–32)
CREATININE: 10.21 mg/dL — AB (ref 0.61–1.24)
Chloride: 93 mmol/L — ABNORMAL LOW (ref 101–111)
GFR calc non Af Amer: 4 mL/min — ABNORMAL LOW (ref >60)
GFR, EST AFRICAN AMERICAN: 5 mL/min — AB (ref >60)
Glucose, Bld: 107 mg/dL — ABNORMAL HIGH (ref 65–99)
Potassium: 4.3 mmol/L (ref 3.5–5.1)
Sodium: 131 mmol/L — ABNORMAL LOW (ref 135–145)
TOTAL PROTEIN: 7.3 g/dL (ref 6.5–8.1)

## 2017-04-14 LAB — CBC
HEMATOCRIT: 25.8 % — AB (ref 39.0–52.0)
HEMOGLOBIN: 8.5 g/dL — AB (ref 13.0–17.0)
MCH: 30.9 pg (ref 26.0–34.0)
MCHC: 32.9 g/dL (ref 30.0–36.0)
MCV: 93.8 fL (ref 78.0–100.0)
Platelets: 157 10*3/uL (ref 150–400)
RBC: 2.75 MIL/uL — ABNORMAL LOW (ref 4.22–5.81)
RDW: 19.2 % — ABNORMAL HIGH (ref 11.5–15.5)
WBC: 8.4 10*3/uL (ref 4.0–10.5)

## 2017-04-14 LAB — HEPARIN LEVEL (UNFRACTIONATED): Heparin Unfractionated: 0.31 IU/mL (ref 0.30–0.70)

## 2017-04-14 LAB — PROTIME-INR
INR: 1.58
Prothrombin Time: 19 seconds — ABNORMAL HIGH (ref 11.4–15.2)

## 2017-04-14 LAB — HEPATITIS B SURFACE ANTIGEN: Hepatitis B Surface Ag: NEGATIVE

## 2017-04-14 LAB — HEPATITIS B CORE ANTIBODY, TOTAL: HEP B C TOTAL AB: NEGATIVE

## 2017-04-14 LAB — HEPATITIS B SURFACE ANTIBODY,QUALITATIVE: Hep B S Ab: NONREACTIVE

## 2017-04-14 MED ORDER — ALTEPLASE 2 MG IJ SOLR: 2.0000 mg | Freq: Once | INTRAMUSCULAR | Status: DC | PRN

## 2017-04-14 MED ORDER — LIDOCAINE HCL (PF) 1 % IJ SOLN
5.0000 mL | INTRAMUSCULAR | Status: DC | PRN
  Filled 2017-04-14: qty 5

## 2017-04-14 MED ORDER — SODIUM CHLORIDE 0.9 % IV SOLN
100.0000 mL | INTRAVENOUS | Status: DC | PRN
  Administered 2017-04-18: 13:00:00 via INTRAVENOUS

## 2017-04-14 MED ORDER — HEPARIN SODIUM (PORCINE) 1000 UNIT/ML DIALYSIS
20.0000 [IU]/kg | INTRAMUSCULAR | Status: DC | PRN
  Filled 2017-04-14: qty 3

## 2017-04-14 MED ORDER — SODIUM CHLORIDE 0.9 % IV SOLN
125.0000 mg | Freq: Every day | INTRAVENOUS | Status: AC
  Administered 2017-04-14 – 2017-04-18 (×4): 125 mg via INTRAVENOUS
  Filled 2017-04-14 (×9): qty 10

## 2017-04-14 MED ORDER — HEPARIN SODIUM (PORCINE) 1000 UNIT/ML DIALYSIS
1000.0000 [IU] | INTRAMUSCULAR | Status: DC | PRN
  Filled 2017-04-14: qty 1

## 2017-04-14 MED ORDER — WARFARIN SODIUM 2.5 MG PO TABS
2.5000 mg | ORAL_TABLET | Freq: Once | ORAL | Status: AC
  Administered 2017-04-14: 2.5 mg via ORAL
  Filled 2017-04-14: qty 1

## 2017-04-14 MED ORDER — LIDOCAINE-PRILOCAINE 2.5-2.5 % EX CREA: 1.0000 "application " | TOPICAL_CREAM | CUTANEOUS | Status: DC | PRN

## 2017-04-14 MED ORDER — ONDANSETRON HCL 4 MG/2ML IJ SOLN
4.0000 mg | Freq: Four times a day (QID) | INTRAMUSCULAR | Status: DC | PRN
  Administered 2017-04-15: 4 mg via INTRAVENOUS

## 2017-04-14 MED ORDER — PENTAFLUOROPROP-TETRAFLUOROETH EX AERO: 1.0000 "application " | INHALATION_SPRAY | CUTANEOUS | Status: DC | PRN

## 2017-04-14 MED ORDER — PERFLUTREN LIPID MICROSPHERE
1.0000 mL | INTRAVENOUS | Status: AC | PRN
  Administered 2017-04-14: 2 mL via INTRAVENOUS
  Filled 2017-04-14: qty 10

## 2017-04-14 MED ORDER — SODIUM CHLORIDE 0.9 % IV SOLN: 100.0000 mL | INTRAVENOUS | Status: DC | PRN

## 2017-04-14 NOTE — Care Management Important Message (Signed)
Important Message  Patient Details  Name: Robert Stanton MRN: [id number] Date of Birth: 07-02-1941   Medicare Important Message Given:  Yes    Ihan Pat Abena 04/14/2017, 10:30 AM

## 2017-04-14 NOTE — Progress Notes (Addendum)
PROGRESS NOTE    Robert Stanton  OEV:[id number] DOB: [birth year] DOA: 04/10/2017 PCP: Robert Pinto, MD   Chief Complaint  Patient presents with  . Nausea  . Dizziness    Brief Narrative:  HPI on 04/10/2017 by Dr. Jenetta Downer Robert Stanton is a 76 y.o. male with medical history significant for- hypertension, coronary artery disease- CABG- 2010,  atrial fibrillation CKD5, hypertension, esophageal strictures, gout, COPD, OSA, mechanical aortic valve, hypothyroidism, peripheral artery disease, type II DM. Patient came in with complaints of bleeding from his rectum was started about 3 days ago, he also reports dark stools- 3-4 days. He denies abdominal pain. In the ED he had an episode of vomiting- with coffee-ground emesis.  He endorses dizziness that started Saturday morning- 04/09/17. This appears to be a chronic issue for patient. Pt denies NSAID use. Patient reports a history of peptic ulcer disease diagnosed with EGD- several years ago. Last EGD 10/2012- was esophageal stricture and erosions, erosive gastritis. Last colonoscopy was about 10 years ago- reports it was normal. He also notes a chronic cough that is productive today, though he has a history of COPD. His shortness of breath is at baseline. He denies fever or chills.  Interim history Patient was found to have heme positive stools, with increasing creatinine and elevated INR. He was admitted for evaluation of GI bleeding. Patient underwent EGD showing AVMs status post hemostatic endoscopy with APC. Nephrology also consulted and hemodialysis initiated.  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 Esophageal stricture without esophagitis. Moderately large hiatal hernia with [name] erosions. -Per gastroenterology, patient may resume  anticoagulation -Hemoglobin currently 8.5 (baseline hemoglobin 8-9) -Continue to monitor CBC -Will start on supplementation 325mg  BID -Per GI recommendations, Protonix indefinitely and iron supplementation   Acute renal failure on chronic kidney disease, stage IV with oliguria -Creatinine continued to worsen, peaked at 11.8 -Nephrology consult and appreciated, hemodialysis initiated, plan for additional HD today -Patient was given IV diuresis however remained oliguric with fluid overload -Interventional radiology consulted and appreciated for HD catheter placement -Of note, patient did have aVF on left upper extremity 03/04/2017 -Placed on phoslo, renavit  Acute on Chronic systolic heart failure -Echocardiogram pending  -Last echocardiogram 05/20/2016 showed an EF of 45%, basal to mid inferior severe hypokinesis. Indeterminate diastolic function -Patient was given diuresis per nephrology however patient continued to remain oliguric -Monitor intake and output, daily weights  Right loculated pleural effusion -No signs of an infection at this time -CT chest: Right-sided calcified pleural plaque, nonspecific with differential including asbestosis related pleural disease versus pleural insult such as trauma or infection. Small bilateral pleural effusions, probably loculated bilaterally with small loculated components. Right major fissure.  Atrial fibrillation/aortic valve replacement -Patient presented with supratherapeutic INR, given vitamin K as well as fresh frozen plasma -INR currently 1.58 -Restarted coumadin with conservative heparin bridge -hemoglobin currently stable  Essential hypertension -BP currently stable, was hypotensive -Continue to monitor closely and start IV medications as needed  Diabetes mellitus, type II -Hemoglobin A1c was 5.1 on 03/05/2017  Atherosclerotic thoracic aorta -4.3 cm ectatic ascending thoracic aorta noted on CT scan -Patient will need annual  imaging by CTA or MRA  Possible cirrhosis/ elevated AST -Noted on CT chest -Patient noted to have mildly increased AST, 126 (continuing to worsen) -Will continue to monitor AST (would be elevated in alcoholism, as  ALT is normal, however, not sure why this would double during hospitalization)  Mildly elevated troponin -Likely secondary to the above, currently chest pain-free -Troponin elevation not consistent with ACS -Echocardiogram pending  Hypothyroidism -Continue Synthroid  Nausea -possibly related to the start of hemodialysis -will order IV zofran PRN and continue to monitor  DVT Prophylaxis  Coumadin/heparin  Code Status: Full  Family Communication: None at bedside  Disposition Plan: Admitted. Dispo pending.  Consultants Nephrology Gastroenterology Interventional radiology   Procedures  EGD  Right IJ HD tunneled catheter placement  Antibiotics   Anti-infectives    Start     Dose/Rate Route Frequency Ordered Stop   04/12/17 1407  ceFAZolin (ANCEF) 2-4 GM/100ML-% IVPB    Comments:  Dhers, Patricia   : cabinet override      04/12/17 1407 04/13/17 0214   04/12/17 1100  ceFAZolin (ANCEF) IVPB 2g/100 mL premix     2 g 200 mL/hr over 30 Minutes Intravenous On call 04/12/17 1019 04/12/17 1443      Subjective:   Robert Stanton seen and examined today in dialysis. Patient complains of nausea this morning and vomited overnight. Denies chest pain, shortness of breath, abdominal pain, dizziness, headache.   Objective:   Vitals:   04/14/17 0443 04/14/17 0454 04/14/17 0506 04/14/17 0531  BP:    (!) 111/58  Pulse: 76 68 77 94  Resp: 16 16 16  (!) 22  Temp:    98.5 F (36.9 C)  TempSrc:      SpO2: 93% 97% 98% 100%  Weight:      Height:        Intake/Output Summary (Last 24 hours) at 04/14/17 1234 Last data filed at 04/14/17 1146  Gross per 24 hour  Intake           582.73 ml  Output                0 ml  Net           582.73 ml   Filed Weights   04/13/17  0500 04/13/17 0653 04/13/17 0953  Weight: 112 kg (247 lb) 109.6 kg (241 lb 10 oz) 107 kg (235 lb 14.3 oz)   Exam  General: Well developed, ill-appearing, NAD  HEENT: NCAT, mucous membranes dry  Cardiovascular: S1 S2 auscultated, irregular  Respiratory: Diminished breath sounds  Abdomen: Soft, nontender, nondistended, + bowel sounds  Extremities: warm dry without cyanosis clubbing. LE edemea 2+  Neuro: AAOx3, nonfocal   Psych: Appropriate and pleasant  Data Reviewed: I have personally reviewed following labs and imaging studies  CBC:  Recent Labs Lab 04/11/17 1835 04/12/17 0154 04/12/17 1023 04/13/17 0716 04/14/17 0623  WBC 8.2 8.0 9.6 9.4 8.4  HGB 8.2* 8.3* 8.5* 8.5* 8.5*  HCT 24.3* 25.2* 26.1* 25.8* 25.8*  MCV 93.1 93.0 93.5 93.8 93.8  PLT 178 169 185 193 330   Basic Metabolic Panel:  Recent Labs Lab 04/10/17 2003 04/11/17 0911 04/12/17 0154 04/13/17 0715 04/14/17 0623  NA 130* 131* 132* 133* 131*  K 3.6 3.9 4.0 4.3 4.3  CL 92* 92* 93* 92* 93*  CO2 22 21* 21* 22 21*  GLUCOSE 116* 111* 129* 118* 107*  BUN 138* 141* 144* 155* 111*  CREATININE 9.90* 10.23* 10.95* 11.80* 10.21*  CALCIUM 8.7* 8.5* 8.4* 8.0* 8.3*  PHOS  --   --  7.0* 8.2*  --    GFR: Estimated Creatinine Clearance: 7.5 mL/min (A) (by C-G formula based on SCr of 10.21 mg/dL (H)). Liver  Function Tests:  Recent Labs Lab 04/10/17 2003 04/12/17 0154 04/13/17 0714 04/13/17 0715 04/14/17 0623  AST 69*  --   --   --  126*  ALT 26  --  51  --  39  ALKPHOS 69  --   --   --  63  BILITOT 1.8*  --   --   --  3.9*  PROT 7.3  --   --   --  7.3  ALBUMIN 2.8* 2.9*  --  2.9* 2.9*    Recent Labs Lab 04/10/17 2046  LIPASE 45   No results for input(s): AMMONIA in the last 168 hours. Coagulation Profile:  Recent Labs Lab 04/10/17 2046 04/11/17 0646 04/12/17 0154 04/13/17 0714 04/14/17 0623  INR 4.34* 1.93 1.54 1.45 1.58   Cardiac Enzymes:  Recent Labs Lab 04/11/17 0216  04/11/17 0646 04/11/17 1425  TROPONINI 0.03* 0.03* 0.05*   BNP (last 3 results) No results for input(s): PROBNP in the last 8760 hours. HbA1C: No results for input(s): HGBA1C in the last 72 hours. CBG: No results for input(s): GLUCAP in the last 168 hours. Lipid Profile: No results for input(s): CHOL, HDL, LDLCALC, TRIG, CHOLHDL, LDLDIRECT in the last 72 hours. Thyroid Function Tests: No results for input(s): TSH, T4TOTAL, FREET4, T3FREE, THYROIDAB in the last 72 hours. Anemia Panel: No results for input(s): VITAMINB12, FOLATE, FERRITIN, TIBC, IRON, RETICCTPCT in the last 72 hours. Urine analysis:    Component Value Date/Time   COLORURINE YELLOW 09/26/2016 1939   APPEARANCEUR CLEAR 09/26/2016 1939   LABSPEC 1.005 09/26/2016 1939   PHURINE 5.0 09/26/2016 1939   GLUCOSEU NEGATIVE 09/26/2016 1939   HGBUR MODERATE (A) 09/26/2016 1939   BILIRUBINUR NEGATIVE 09/26/2016 1939   KETONESUR NEGATIVE 09/26/2016 1939   PROTEINUR NEGATIVE 09/26/2016 1939   NITRITE NEGATIVE 09/26/2016 1939   LEUKOCYTESUR NEGATIVE 09/26/2016 1939   Sepsis Labs: @LABRCNTIP (procalcitonin:4,lacticidven:4)  )No results found for this or any previous visit (from the past 240 hour(s)).    Radiology Studies: Ir Fluoro Guide Cv Line Right  Result Date: 04/12/2017 INDICATION: Acute on chronic renal failure, no current access for dialysis EXAM: ULTRASOUND GUIDANCE FOR VASCULAR ACCESS RIGHT INTERNAL JUGULAR PERMANENT HEMODIALYSIS CATHETER Date:  6/26/20186/26/2018 3:00 pm Radiologist:  M. Daryll Brod, MD Guidance:  Ultrasound and fluoroscopic FLUOROSCOPY TIME:  Fluoroscopy Time: 43 seconds (10 mGy). MEDICATIONS: Ancef 2 g administered within 1 hour of the procedure ANESTHESIA/SEDATION: Versed 0.5 mg IV; Fentanyl 12.5 mcg IV; Moderate Sedation Time:  26 minutes The patient was continuously monitored during the procedure by the interventional radiology nurse under my direct supervision. CONTRAST:  None. COMPLICATIONS:  None immediate. PROCEDURE: Informed consent was obtained from the patient following explanation of the procedure, risks, benefits and alternatives. The patient understands, agrees and consents for the procedure. All questions were addressed. A time out was performed. Maximal barrier sterile technique utilized including caps, mask, sterile gowns, sterile gloves, large sterile drape, hand hygiene, and 2% chlorhexidine scrub. Under sterile conditions and local anesthesia, right internal jugular micropuncture venous access was performed with ultrasound. Images were obtained for documentation. A guide wire was inserted followed by a transitional dilator. Next, a 0.035 guidewire was advanced into the IVC with a 5-French catheter. Measurements were obtained from the right venotomy site to the proximal right atrium. In the right infraclavicular chest, a subcutaneous tunnel was created under sterile conditions and local anesthesia. 1% lidocaine with epinephrine was utilized for this. The 23 cm tip to cuff palindrome catheter was tunneled subcutaneously to  the venotomy site and inserted into the SVC/RA junction through a valved peel-away sheath. Position was confirmed with fluoroscopy. Images were obtained for documentation. Blood was aspirated from the catheter followed by saline and heparin flushes. The appropriate volume and strength of heparin was instilled in each lumen. Caps were applied. The catheter was secured at the tunnel site with Gelfoam and a pursestring suture. The venotomy site was closed with subcuticular Vicryl suture. Dermabond was applied to the small right neck incision. A dry sterile dressing was applied. The catheter is ready for use. No immediate complications. IMPRESSION: Ultrasound and fluoroscopically guided right internal jugular tunneled hemodialysis catheter (23 cm tip to cuff palindrome catheter). Electronically Signed   By: Jerilynn Mages.  Shick M.D.   On: 04/12/2017 15:08   Ir US Guide Vasc Access  Right  Result Date: 04/12/2017 INDICATION: Acute on chronic renal failure, no current access for dialysis EXAM: ULTRASOUND GUIDANCE FOR VASCULAR ACCESS RIGHT INTERNAL JUGULAR PERMANENT HEMODIALYSIS CATHETER Date:  6/26/20186/26/2018 3:00 pm Radiologist:  M. Daryll Brod, MD Guidance:  Ultrasound and fluoroscopic FLUOROSCOPY TIME:  Fluoroscopy Time: 43 seconds (10 mGy). MEDICATIONS: Ancef 2 g administered within 1 hour of the procedure ANESTHESIA/SEDATION: Versed 0.5 mg IV; Fentanyl 12.5 mcg IV; Moderate Sedation Time:  26 minutes The patient was continuously monitored during the procedure by the interventional radiology nurse under my direct supervision. CONTRAST:  None. COMPLICATIONS: None immediate. PROCEDURE: Informed consent was obtained from the patient following explanation of the procedure, risks, benefits and alternatives. The patient understands, agrees and consents for the procedure. All questions were addressed. A time out was performed. Maximal barrier sterile technique utilized including caps, mask, sterile gowns, sterile gloves, large sterile drape, hand hygiene, and 2% chlorhexidine scrub. Under sterile conditions and local anesthesia, right internal jugular micropuncture venous access was performed with ultrasound. Images were obtained for documentation. A guide wire was inserted followed by a transitional dilator. Next, a 0.035 guidewire was advanced into the IVC with a 5-French catheter. Measurements were obtained from the right venotomy site to the proximal right atrium. In the right infraclavicular chest, a subcutaneous tunnel was created under sterile conditions and local anesthesia. 1% lidocaine with epinephrine was utilized for this. The 23 cm tip to cuff palindrome catheter was tunneled subcutaneously to the venotomy site and inserted into the SVC/RA junction through a valved peel-away sheath. Position was confirmed with fluoroscopy. Images were obtained for documentation. Blood was  aspirated from the catheter followed by saline and heparin flushes. The appropriate volume and strength of heparin was instilled in each lumen. Caps were applied. The catheter was secured at the tunnel site with Gelfoam and a pursestring suture. The venotomy site was closed with subcuticular Vicryl suture. Dermabond was applied to the small right neck incision. A dry sterile dressing was applied. The catheter is ready for use. No immediate complications. IMPRESSION: Ultrasound and fluoroscopically guided right internal jugular tunneled hemodialysis catheter (23 cm tip to cuff palindrome catheter). Electronically Signed   By: Jerilynn Mages.  Shick M.D.   On: 04/12/2017 15:08     Scheduled Meds: . albuterol  2.5 mg Nebulization TID  . calcium acetate  1,334 mg Oral TID WC  . levothyroxine  50 mcg Oral QAC breakfast  . montelukast  10 mg Oral QHS  . multivitamin  1 tablet Oral QHS  . pantoprazole  40 mg Oral Q0600  . Warfarin - Pharmacist Dosing Inpatient   Does not apply q1800   Continuous Infusions: . ferric gluconate (FERRLECIT/NULECIT) IV Stopped (04/14/17  1246)  . heparin 1,150 Units/hr (04/14/17 1138)     LOS: 4 days   Time Spent in minutes   40 minutes  Jisella Ashenfelter D.O. on 04/14/2017 at 12:34 PM  Between 7am to 7pm - Pager - [phone]  After 7pm go to [email or web address] - password [email or web address]  And look for the night coverage person covering for me after hours  [facility] Office  [phone]

## 2017-04-14 NOTE — Progress Notes (Signed)
Physical Therapy Treatment Patient Details Name: Robert Stanton MRN: [id number] DOB: [birth year] Today's Date: 04/14/2017    History of Present Illness Pt admitted with GIB, anemia. Started on HD 04/12/17.  PMH: CKD, chronic anemia, CAD, CABG, mechanical aortic valve, COPD, CHF, DM, afib.     PT Comments    Pt fatigued from rolling and getting cleaned from a BM and therefore only transferred to recliner.  He does wheeze with o2 97% on 2 L/min.  Noted +3-4 pitting edema in hips.  Con't to recommend SNF.   Follow Up Recommendations  SNF;Supervision/Assistance - 24 hour     Equipment Recommendations  None recommended by PT    Recommendations for Other Services       Precautions / Restrictions Precautions Precautions: Fall Restrictions Weight Bearing Restrictions: No    Mobility  Bed Mobility Overal bed mobility: Needs Assistance Bed Mobility: Rolling;Sidelying to Sit Rolling: Max assist Sidelying to sit: Mod assist;+2 for physical assistance       General bed mobility comments: Pt laying in BM and needed to be clean.  MAX A with cues for hand placement on rails. MOD OF 2 to get to EOB and A to scoot to EOB.  Transfers Overall transfer level: Needs assistance Equipment used: Rolling walker (2 wheeled) Transfers: Sit to/from [facility] Sit to Stand: +2 physical assistance;Mod assist;From elevated surface Stand pivot transfers: +2 physical assistance;Min assist       General transfer comment: Short shuffeled steps with RW and cues to turn completely in front of recliner. Pt wheezing on 2 L/min with o2 97%  Ambulation/Gait             General Gait Details: deferred due to fatigue   Stairs            Wheelchair Mobility    Modified Rankin (Stroke Patients Only)       Balance Overall balance assessment: Needs assistance;History of Falls   Sitting balance-Leahy Scale: Fair     Standing balance support: Bilateral upper extremity  supported Standing balance-Leahy Scale: Poor                              Cognition Arousal/Alertness: Awake/alert Behavior During Therapy: WFL for tasks assessed/performed Overall Cognitive Status: Within Functional Limits for tasks assessed                                        Exercises      General Comments        Pertinent Vitals/Pain Pain Assessment: No/denies pain    Home Living                      Prior Function            PT Goals (current goals can now be found in the care plan section) Acute Rehab PT Goals Patient Stated Goal: did not state PT Goal Formulation: With patient Time For Goal Achievement: 04/27/17 Potential to Achieve Goals: Good Progress towards PT goals: Progressing toward goals    Frequency    Min 3X/week      PT Plan Current plan remains appropriate    Co-evaluation              AM-PAC PT "6 Clicks" Daily Activity  Outcome Measure  Difficulty turning over in bed (including  adjusting bedclothes, sheets and blankets)?: Total Difficulty moving from lying on back to sitting on the side of the bed? : Total Difficulty sitting down on and standing up from a chair with arms (e.g., wheelchair, bedside commode, etc,.)?: Total Help needed moving to and from a bed to chair (including a wheelchair)?: A Lot Help needed walking in hospital room?: A Lot Help needed climbing 3-5 steps with a railing? : Total 6 Click Score: 8    End of Session Equipment Utilized During Treatment: Gait belt Activity Tolerance: Patient limited by fatigue Patient left: in chair;with call bell/phone within reach;with chair alarm set Nurse Communication: Mobility status PT Visit Diagnosis: Unsteadiness on feet (R26.81);Muscle weakness (generalized) (M62.81);History of falling (Z91.81)     Time: 5615-3794 PT Time Calculation (min) (ACUTE ONLY): 27 min  Charges:  $Therapeutic Activity: 23-37 mins                     G Codes:       Josafat Enrico L. Tamala Julian, [place] Pager [phone] 04/14/2017    Galen Manila 04/14/2017, 1:33 PM

## 2017-04-14 NOTE — Progress Notes (Addendum)
RN called HD nurse to change Heparin rate to 12 cc/hr per pharmacy. Nurse not available and I left a message to Network engineer.

## 2017-04-14 NOTE — Progress Notes (Signed)
Patient going to HD. Alert and oriented, no complaining of pain or other distress. Report was given to the nurse in HD. Will continue to monitor.

## 2017-04-14 NOTE — Progress Notes (Signed)
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 Cirrhosis by CT Plan: HD again today, IV iron, arrange OP HD   Subjective: Interval History: Echo in progress  Objective: Vital signs in last 24 hours: Temp:  [98.5 F (36.9 C)-98.6 F (37 C)] 98.5 F (36.9 C) (06/28 0531) Pulse Rate:  [68-94] 94 (06/28 0531) Resp:  [16-22] 22 (06/28 0531) BP: (46-112)/(33-58) 111/58 (06/28 0531) SpO2:  [62 %-100 %] 100 % (06/28 0531) Weight change: -5.038 kg (-11 lb 1.7 oz)  Intake/Output from previous day: 06/27 0701 - 06/28 0700 In: 242.7 [P.O.:220; I.V.:22.7] Out: 2500  Intake/Output this shift: Total I/O In: 120 [P.O.:120] Out: -   Extremities: edema 2+ in LEs bilat  [facility]  Lab Results:  Recent Labs  04/13/17 0716 04/14/17 0623  WBC 9.4 8.4  HGB 8.5* 8.5*  HCT 25.8* 25.8*  PLT 193 157   BMET:  Recent Labs  04/13/17 0715 04/14/17 0623  NA 133* 131*  K 4.3 4.3  CL 92* 93*  CO2 22 21*  GLUCOSE 118* 107*  BUN 155* 111*  CREATININE 11.80* 10.21*  CALCIUM 8.0* 8.3*   No results for input(s): PTH in the last 72 hours. Iron Studies: No results for input(s): IRON, TIBC, TRANSFERRIN, FERRITIN in the last 72 hours. Studies/Results: Ir Fluoro Guide Cv Line Right  Result Date: 04/12/2017 INDICATION: Acute on chronic renal failure, no current access for dialysis EXAM: ULTRASOUND GUIDANCE FOR VASCULAR ACCESS RIGHT INTERNAL JUGULAR PERMANENT HEMODIALYSIS CATHETER Date:  6/26/20186/26/2018 3:00 pm Radiologist:  M. Daryll Brod, MD Guidance:  Ultrasound and fluoroscopic FLUOROSCOPY TIME:  Fluoroscopy Time: 43 seconds (10 mGy). MEDICATIONS: Ancef 2 g administered within 1 hour of the procedure ANESTHESIA/SEDATION: Versed 0.5 mg IV; Fentanyl 12.5 mcg IV; Moderate Sedation Time:  26 minutes The patient  was continuously monitored during the procedure by the interventional radiology nurse under my direct supervision. CONTRAST:  None. COMPLICATIONS: None immediate. PROCEDURE: Informed consent was obtained from the patient following explanation of the procedure, risks, benefits and alternatives. The patient understands, agrees and consents for the procedure. All questions were addressed. A time out was performed. Maximal barrier sterile technique utilized including caps, mask, sterile gowns, sterile gloves, large sterile drape, hand hygiene, and 2% chlorhexidine scrub. Under sterile conditions and local anesthesia, right internal jugular micropuncture venous access was performed with ultrasound. Images were obtained for documentation. A guide wire was inserted followed by a transitional dilator. Next, a 0.035 guidewire was advanced into the IVC with a 5-French catheter. Measurements were obtained from the right venotomy site to the proximal right atrium. In the right infraclavicular chest, a subcutaneous tunnel was created under sterile conditions and local anesthesia. 1% lidocaine with epinephrine was utilized for this. The 23 cm tip to cuff palindrome catheter was tunneled subcutaneously to the venotomy site and inserted into the SVC/RA junction through a valved peel-away sheath. Position was confirmed with fluoroscopy. Images were obtained for documentation. Blood was aspirated from the catheter followed by saline and heparin flushes. The appropriate volume and strength of heparin was instilled in each lumen. Caps were applied. The catheter was secured at the tunnel site with Gelfoam and a pursestring suture. The venotomy site was closed with subcuticular Vicryl suture. Dermabond was applied to the small right neck incision. A  dry sterile dressing was applied. The catheter is ready for use. No immediate complications. IMPRESSION: Ultrasound and fluoroscopically guided right internal jugular tunneled hemodialysis  catheter (23 cm tip to cuff palindrome catheter). Electronically Signed   By: Jerilynn Mages.  Shick M.D.   On: 04/12/2017 15:08   Ir US Guide Vasc Access Right  Result Date: 04/12/2017 INDICATION: Acute on chronic renal failure, no current access for dialysis EXAM: ULTRASOUND GUIDANCE FOR VASCULAR ACCESS RIGHT INTERNAL JUGULAR PERMANENT HEMODIALYSIS CATHETER Date:  6/26/20186/26/2018 3:00 pm Radiologist:  M. Daryll Brod, MD Guidance:  Ultrasound and fluoroscopic FLUOROSCOPY TIME:  Fluoroscopy Time: 43 seconds (10 mGy). MEDICATIONS: Ancef 2 g administered within 1 hour of the procedure ANESTHESIA/SEDATION: Versed 0.5 mg IV; Fentanyl 12.5 mcg IV; Moderate Sedation Time:  26 minutes The patient was continuously monitored during the procedure by the interventional radiology nurse under my direct supervision. CONTRAST:  None. COMPLICATIONS: None immediate. PROCEDURE: Informed consent was obtained from the patient following explanation of the procedure, risks, benefits and alternatives. The patient understands, agrees and consents for the procedure. All questions were addressed. A time out was performed. Maximal barrier sterile technique utilized including caps, mask, sterile gowns, sterile gloves, large sterile drape, hand hygiene, and 2% chlorhexidine scrub. Under sterile conditions and local anesthesia, right internal jugular micropuncture venous access was performed with ultrasound. Images were obtained for documentation. A guide wire was inserted followed by a transitional dilator. Next, a 0.035 guidewire was advanced into the IVC with a 5-French catheter. Measurements were obtained from the right venotomy site to the proximal right atrium. In the right infraclavicular chest, a subcutaneous tunnel was created under sterile conditions and local anesthesia. 1% lidocaine with epinephrine was utilized for this. The 23 cm tip to cuff palindrome catheter was tunneled subcutaneously to the venotomy site and inserted into the  SVC/RA junction through a valved peel-away sheath. Position was confirmed with fluoroscopy. Images were obtained for documentation. Blood was aspirated from the catheter followed by saline and heparin flushes. The appropriate volume and strength of heparin was instilled in each lumen. Caps were applied. The catheter was secured at the tunnel site with Gelfoam and a pursestring suture. The venotomy site was closed with subcuticular Vicryl suture. Dermabond was applied to the small right neck incision. A dry sterile dressing was applied. The catheter is ready for use. No immediate complications. IMPRESSION: Ultrasound and fluoroscopically guided right internal jugular tunneled hemodialysis catheter (23 cm tip to cuff palindrome catheter). Electronically Signed   By: Jerilynn Mages.  Shick M.D.   On: 04/12/2017 15:08    Scheduled: . albuterol  2.5 mg Nebulization TID  . calcium acetate  1,334 mg Oral TID WC  . ferrous sulfate  325 mg Oral BID WC  . levothyroxine  50 mcg Oral QAC breakfast  . montelukast  10 mg Oral QHS  . multivitamin  1 tablet Oral QHS  . pantoprazole  40 mg Oral Q0600  . Warfarin - Pharmacist Dosing Inpatient   Does not apply q1800    LOS: 4 days   Donica Derouin C 04/14/2017,11:24 AM

## 2017-04-14 NOTE — Progress Notes (Signed)
ANTICOAGULATION CONSULT NOTE - Follow Up Consult  Pharmacy Consult for Heparin >> Coumadin Indication: atrial fibrillation and mechanical AVR  Allergies  Allergen Reactions  . Chantix [Varenicline] Nausea And Vomiting  . Lyrica [Pregabalin] Other (See Comments)    Swelling, couldn't breathe  . Nsaids Other (See Comments)    Cannot take while on coumadin  . Simvastatin Other (See Comments)    Pt said he had a "bleed out" after taking it  . Ziac [Bisoprolol-Hydrochlorothiazide] Other (See Comments)    fatigue    Patient Measurements: Height: 5\' 8"  (172.7 cm) Weight: 235 lb 14.3 oz (107 kg) IBW/kg (Calculated) : 68.4 Heparin Dosing Weight: 93 kg  Vital Signs: Temp: 98.2 F (36.8 C) (06/28 1520) Temp Source: Oral (06/28 1520) BP: 133/62 (06/28 1520) Pulse Rate: 97 (06/28 1520)  Labs:  Recent Labs  04/12/17 0154 04/12/17 1023 04/13/17 0714 04/13/17 0715 04/13/17 0716 04/13/17 2047 04/14/17 0623  HGB 8.3* 8.5*  --   --  8.5*  --  8.5*  HCT 25.2* 26.1*  --   --  25.8*  --  25.8*  PLT 169 185  --   --  193  --  157  LABPROT 18.6*  --  17.8*  --   --   --  19.0*  INR 1.54  --  1.45  --   --   --  1.58  HEPARINUNFRC  --   --   --   --   --  0.28* 0.31  CREATININE 10.95*  --   --  11.80*  --   --  10.21*    Estimated Creatinine Clearance: 7.5 mL/min (A) (by C-G formula based on SCr of 10.21 mg/dL (H)).   Medications:  Scheduled:  . albuterol  2.5 mg Nebulization TID  . calcium acetate  1,334 mg Oral TID WC  . levothyroxine  50 mcg Oral QAC breakfast  . montelukast  10 mg Oral QHS  . multivitamin  1 tablet Oral QHS  . pantoprazole  40 mg Oral Q0600  . Warfarin - Pharmacist Dosing Inpatient   Does not apply q1800   Infusions:  . sodium chloride    . sodium chloride    . ferric gluconate (FERRLECIT/NULECIT) IV Stopped (04/14/17 1246)  . heparin 1,150 Units/hr (04/14/17 1138)    Assessment: 76 yo M admitted 6/24 with rectal bleeding.  Pt was on Coumadin PTA  for hx mechanical AVR and afib.  This was held in the setting of acute GIB and supratherapeutic INR.  Anticoagulation with heparin >> Coumadin was restarted 6/27.  Heparin is at low end of therapeutic goal on 1150 units/hr.  INR has trended up after initial Coumadin 2.5mg  dose.  Goal of Therapy:  INR 2-3 (prefer 2-2.5 with recent bleed) Heparin level 0.3-0.5 units/ml (with recent bleed) Monitor platelets by anticoagulation protocol: Yes   Plan:  Increase heparin to 1200 units/hr to maintain goal. Repeat Coumadin 2.5mg  PO x 1 tonight Daily INR, heparin level, and CBC  Gerard Cantara, Pharm.D., BCPS Clinical Pharmacist Pager: [phone] Clinical phone for 04/14/2017 from 8:30-4:00 is [phone]. After 4pm, please call Main [facility] [phone]) for assistance. 04/14/2017 3:49 PM

## 2017-04-14 NOTE — Progress Notes (Signed)
  Echocardiogram 2D Echocardiogram has been performed.  Robert Stanton [name] 04/14/2017, 11:08 AM

## 2017-04-15 ENCOUNTER — Encounter (HOSPITAL_COMMUNITY): Payer: Self-pay | Admitting: Cardiology

## 2017-04-15 DIAGNOSIS — R740 Nonspecific elevation of levels of transaminase and lactic acid dehydrogenase [LDH]: Secondary | ICD-10-CM

## 2017-04-15 DIAGNOSIS — I5041 Acute combined systolic (congestive) and diastolic (congestive) heart failure: Secondary | ICD-10-CM

## 2017-04-15 LAB — COMPREHENSIVE METABOLIC PANEL
ALT: 28 U/L (ref 17–63)
ANION GAP: 16 — AB (ref 5–15)
AST: 105 U/L — ABNORMAL HIGH (ref 15–41)
Albumin: 3 g/dL — ABNORMAL LOW (ref 3.5–5.0)
Alkaline Phosphatase: 63 U/L (ref 38–126)
BUN: 73 mg/dL — ABNORMAL HIGH (ref 6–20)
CHLORIDE: 93 mmol/L — AB (ref 101–111)
CO2: 25 mmol/L (ref 22–32)
Calcium: 8.5 mg/dL — ABNORMAL LOW (ref 8.9–10.3)
Creatinine, Ser: 8.23 mg/dL — ABNORMAL HIGH (ref 0.61–1.24)
GFR, EST AFRICAN AMERICAN: 7 mL/min — AB (ref >60)
GFR, EST NON AFRICAN AMERICAN: 6 mL/min — AB (ref >60)
Glucose, Bld: 101 mg/dL — ABNORMAL HIGH (ref 65–99)
Potassium: 3.9 mmol/L (ref 3.5–5.1)
SODIUM: 134 mmol/L — AB (ref 135–145)
Total Bilirubin: 4.3 mg/dL — ABNORMAL HIGH (ref 0.3–1.2)
Total Protein: 7.7 g/dL (ref 6.5–8.1)

## 2017-04-15 LAB — CBC
HCT: 25.2 % — ABNORMAL LOW (ref 39.0–52.0)
Hemoglobin: 8.5 g/dL — ABNORMAL LOW (ref 13.0–17.0)
MCH: 32.1 pg (ref 26.0–34.0)
MCHC: 33.7 g/dL (ref 30.0–36.0)
MCV: 95.1 fL (ref 78.0–100.0)
Platelets: 144 10*3/uL — ABNORMAL LOW (ref 150–400)
RBC: 2.65 MIL/uL — AB (ref 4.22–5.81)
RDW: 19.9 % — AB (ref 11.5–15.5)
WBC: 7.4 10*3/uL (ref 4.0–10.5)

## 2017-04-15 LAB — PROTIME-INR
INR: 1.55
PROTHROMBIN TIME: 18.8 s — AB (ref 11.4–15.2)

## 2017-04-15 LAB — HEPARIN LEVEL (UNFRACTIONATED): HEPARIN UNFRACTIONATED: 0.28 [IU]/mL — AB (ref 0.30–0.70)

## 2017-04-15 MED ORDER — ONDANSETRON HCL 4 MG/2ML IJ SOLN
INTRAMUSCULAR | Status: AC
  Filled 2017-04-15: qty 2

## 2017-04-15 MED ORDER — WARFARIN SODIUM 2.5 MG PO TABS
2.5000 mg | ORAL_TABLET | Freq: Once | ORAL | Status: AC
  Administered 2017-04-15: 2.5 mg via ORAL
  Filled 2017-04-15: qty 1

## 2017-04-15 NOTE — Consult Note (Signed)
Cardiology Consult    Patient ID: Robert Stanton MRN: [id number], DOB/AGE: [birth year]   Admit date: 04/10/2017 Date of Consult: 04/15/2017  Primary Physician: Unk Pinto, MD Primary Cardiologist: Dr. [name] Requesting Provider: Dr. Ree Kida  Reason for Consult: CHF, reduced EF  Patient Profile    Robert Stanton has a PMH significant for CAD s/p CABG and AVR with mechanical valve (2002, LIMA to LAD, SVG to RCA, SVG to OM). Heart cath on 11/25/08 showed occluded SVG x 2 and 100% occluded RCA with collateral flow. He also has a PMH significant for Atrial fibrillation with slow vs controlled ventricular response on coumadin and amiodarone, COPD, HTN, esophageal strictures, gout, OSA, PAD, DM, and hypothyroidism. He presented to [facility] with 3-day history of blood in his stool and had coffee-ground emesis in the ED. Echocardiogram showed worsened EF.   Robert Stanton is a 76 y.o. male who is being seen today for the evaluation of acute on chronic systolic heart failure at the request of Dr. Ree Kida.   Past Medical History   Past Medical History:  Diagnosis Date  . AAA (abdominal aortic aneurysm) ([place])   . Arthritis   . CAD (coronary artery disease)    a. CABG '00. b. all SVGs occluded 2010. c. low risk Nuc 2012   . Cataracts, bilateral   . CKD (chronic kidney disease), stage III   . COPD (chronic obstructive pulmonary disease) ([place])   . GERD (gastroesophageal reflux disease)   . Gout   . Hiatal hernia   . HOH (hard of hearing)   . Hyperlipidemia   . Hypertension   . Hypothyroidism   . Ischemic cardiomyopathy   . Kidney cysts   . Mechanical heart valve present    a. Hx mechanical St Jude AVR 2000.  [name] OSA (obstructive sleep apnea)    wears CPAP  . Peripheral arterial disease ([place])    a. s/p aorto bifem BG;  b. 11/2013 PVA distal R Fem-fem stenosis;  c. 12/2013 PTA of dist R femoral bypass graft.  . Permanent atrial fibrillation ([place])   . Prediabetes   . Ventricular  tachycardia (paroxysmal) ([place])    Seen by Dr. Lovena Le with EP, started on amiodarone    Past Surgical History:  Procedure Laterality Date  . AORTIC VALVE REPLACEMENT     St. Jude  . AV FISTULA PLACEMENT Left 03/04/2017   Procedure: ARTERIOVENOUS (AV) FISTULA CREATION LEFT ARM;  Surgeon: Serafina Mitchell, MD;  Location: [place];  Service: Vascular;  Laterality: Left;  . CARDIAC CATHETERIZATION  11/25/2008   loss of 2/3 (RCA, OM) bypass grafts with patent internal mammary artery to LAD; large collateral filling of RCA ; osteal narrowing of circumflex of ~50%  . CARDIOVERSION  8/282012  . CORONARY ARTERY BYPASS GRAFT  2000   3 vessel; LIMA to LAD; RCA, OM  . ESOPHAGOGASTRODUODENOSCOPY N/A 04/11/2017   Procedure: ESOPHAGOGASTRODUODENOSCOPY (EGD);  Surgeon: Irene Shipper, MD;  Location: [facility] ENDOSCOPY;  Service: Endoscopy;  Laterality: N/A;  . FEMORAL BYPASS    . IR FLUORO GUIDE CV LINE RIGHT  04/12/2017  . IR US GUIDE VASC ACCESS RIGHT  04/12/2017  . LOWER EXTREMITY ANGIOGRAM N/A 12/03/2013   Procedure: LOWER EXTREMITY ANGIOGRAM;  Surgeon: Lorretta Harp, MD;  Location: [facility] CATH LAB;  Service: Cardiovascular;  Laterality: N/A;  . LOWER EXTREMITY ANGIOGRAM N/A 01/07/2014   Procedure: LOWER EXTREMITY ANGIOGRAM;  Surgeon: Lorretta Harp, MD;  Location: [facility] CATH LAB;  Service: Cardiovascular;  Laterality:  N/A;  . shunts in femoral arteries    . stents in kidneys       Allergies  Allergies  Allergen Reactions  . Chantix [Varenicline] Nausea And Vomiting  . Lyrica [Pregabalin] Other (See Comments)    Swelling, couldn't breathe  . Nsaids Other (See Comments)    Cannot take while on coumadin  . Simvastatin Other (See Comments)    Pt said he had a "bleed out" after taking it  . Ziac [Bisoprolol-Hydrochlorothiazide] Other (See Comments)    fatigue    History of Present Illness    Robert Stanton is well-known to our service and last saw Dr. [name] in clinic on 11/22/16,. At that time, he was recovering  from a recent CHF exacerbation treated OP by his PCP with an increase in bumex to 2 mg. He now takes 1 mg of bumex as needed. He reported recent positional dizziness, but was negative for orthostatic hypotension in the office. His balance and dizziness issues were thought to be related to inner ear problems and he was referred to ENT. Previous cardioversions were mentioned for his chronic Afib; he declined repeat DCCV. He is rate-controlled on amiodarone.   Robert Stanton reported to [facility] with a 3-day history of bloody in his stool and had coffee-ground emesis while in the ED. He was admitted to hospitalist service and was found to have worsening kidney function and elevated INR to 4.43. Nephrology was consulted and initiated HD. Last EGD in 10/2012 with esphageal stricture and erosions with erosive gastritis. EGD this admission with AVMs s/p hemostatic endoscopy.  Coumadin has since been restarted after vitamin K given initially with a conservative heparin bridge. Hb has been stable. He also has a right-sided loculated pleural effusion. Echocardiogram this admission with worsened EF to 35%. Cardiology was consulted for management. He denies chest pain, but isn't clear about other symptoms.   Inpatient Medications    . albuterol  2.5 mg Nebulization TID  . calcium acetate  1,334 mg Oral TID WC  . levothyroxine  50 mcg Oral QAC breakfast  . montelukast  10 mg Oral QHS  . multivitamin  1 tablet Oral QHS  . pantoprazole  40 mg Oral Q0600  . warfarin  2.5 mg Oral ONCE-1800  . Warfarin - Pharmacist Dosing Inpatient   Does not apply q1800     Outpatient Medications    Prior to Admission medications   Medication Sig Start Date End Date Taking? Authorizing Provider  albuterol (PROVENTIL) (2.5 MG/3ML) 0.083% nebulizer solution Take 3 mLs (2.5 mg total) by nebulization every 4 (four) hours as needed for wheezing or shortness of breath. 11/24/16  Yes Forcucci, Courtney, PA-C  allopurinol (ZYLOPRIM) 300 MG  tablet TAKE 1 TABLET BY MOUTH DAILY. TO PREVENT GOUT 02/23/17  Yes Vicie Mutters, PA-C  amiodarone (PACERONE) 200 MG tablet TAKE 1 TABLET (200 MG TOTAL) BY MOUTH DAILY. 02/23/17  Yes Hilty, [name], MD  aspirin EC 81 MG tablet Take 81 mg by mouth daily.   Yes [provider]  Cholecalciferol (VITAMIN D3) 5000 units TABS Take 10,000 Units by mouth daily.   Yes [provider]  clotrimazole-betamethasone (LOTRISONE) cream Apply 1 application topically daily as needed (rash/ skin irritation).   Yes [provider]  fenofibrate (TRICOR) 145 MG tablet TAKE 1 TABLET DAILY FOR BLOOD FATS 01/30/16  Yes Vicie Mutters, PA-C  isosorbide mononitrate (IMDUR) 30 MG 24 hr tablet TAKE 1 TABLET BY MOUTH EVERY DAY 11/07/16  Yes Unk Pinto, MD  levothyroxine (SYNTHROID, LEVOTHROID) 50 MCG tablet TAKE 1 TABLET BY MOUTH EVERY DAY 11/02/16  Yes Unk Pinto, MD  metolazone (ZAROXOLYN) 5 MG tablet Take 1 tablet daily as directed for fluid/swelling 03/28/17 06/28/17 Yes Unk Pinto, MD  montelukast (SINGULAIR) 10 MG tablet TAKE 1 TABLET (10 MG TOTAL) BY MOUTH DAILY. 11/19/16  Yes Unk Pinto, MD  NITROSTAT 0.4 MG SL tablet PLACE 1 TABLET (0.4 MG TOTAL) UNDER THE TONGUE EVERY 5 (FIVE) MINUTES AS NEEDED FOR CHEST PAIN. 02/11/16  Yes Vicie Mutters, PA-C  pravastatin (PRAVACHOL) 40 MG tablet TAKE 1 TABLET (40 MG TOTAL) BY MOUTH EVERY MORNING. 11/24/16  Yes Vicie Mutters, PA-C  sertraline (ZOLOFT) 50 MG tablet TAKE 1 TABLET (50 MG TOTAL) BY MOUTH DAILY. 10/01/16  Yes Unk Pinto, MD  torsemide (DEMADEX) 100 MG tablet Take 100 mg by mouth daily.   Yes [provider]  verapamil (CALAN) 80 MG tablet TAKE 1 TABLET (80 MG TOTAL) BY MOUTH 2 (TWO) TIMES DAILY. 02/23/17  Yes Vicie Mutters, PA-C  vitamin B-12 (CYANOCOBALAMIN) 1000 MCG tablet Take 1,000 mcg by mouth daily.   Yes [provider]  warfarin (COUMADIN) 5 MG tablet Take 1 tablet daily or as directed by your  doctor. Patient taking differently: Take 2.5 mg by mouth See admin instructions. Take 2.5 mg daily except Monday and Friday. 07/29/16  Yes Unk Pinto, MD     Family History     Family History  Problem Relation Age of Onset  . Heart attack Mother   . Heart attack Father   . Lung cancer Sister        x 2  . Kidney cancer Sister   . Kidney disease Sister   . Cancer Brother        ?  . Stroke Brother     Social History    Social History   Social History  . Marital status: Married    Spouse name: N/A  . Number of children: 3  . Years of education: N/A   Occupational History  . Not on file.   Social History Main Topics  . Smoking status: Current Every Day Smoker    Packs/day: 0.50    Years: 40.00    Types: Cigarettes  . Smokeless tobacco: Never Used  . Alcohol use No  . Drug use: No  . Sexual activity: Not on file   Other Topics Concern  . Not on file   Social History Narrative   Lives in [place] with disabled wife, three sons, and grandson   Retired     Review of Systems    General:  No chills, fever, night sweats or weight changes.  Cardiovascular:  No chest pain, dyspnea on exertion, + edema, orthopnea, palpitations, paroxysmal nocturnal dyspnea. Dermatological: No rash, lesions/masses Respiratory: No cough, + dyspnea Urologic: No hematuria, dysuria Abdominal:   No nausea, vomiting, diarrhea, bright red blood per rectum, melena, or hematemesis Neurologic:  No visual changes, + wkns All other systems reviewed and are otherwise negative except as noted above.  Physical Exam    Blood pressure (!) 123/92, pulse 66, temperature 98.2 F (36.8 C), temperature source Oral, resp. rate 18, height 5\' 8"  (1.727 m), weight 238 lb 1.6 oz (108 kg), SpO2 99 %.  General: Pleasant, NAD Psych: Normal affect. Neuro: Alert and oriented X 3. Moves all extremities spontaneously. HEENT: Normal  Neck: Supple without bruits or JVD. Lungs:  Coarse sounds  throughout, diminished on right, inspiratory and expiratory wheezes Heart: Irregular  rhythm and bradycardic rate, aortic valve click. Abdomen: Soft, non-tender, non-distended, BS + x 4.  Extremities: No clubbing, cyanosis, 1+ B LE and B UE edema. DP/PT/Radials 1+ and equal bilaterally.  Labs    Troponin (Point of Care Test) No results for input(s): TROPIPOC in the last 72 hours. No results for input(s): CKTOTAL, CKMB, TROPONINI in the last 72 hours. Lab Results  Component Value Date   WBC 7.4 04/15/2017   HGB 8.5 (L) 04/15/2017   HCT 25.2 (L) 04/15/2017   MCV 95.1 04/15/2017   PLT 144 (L) 04/15/2017     Recent Labs Lab 04/15/17 0525  NA 134*  K 3.9  CL 93*  CO2 25  BUN 73*  CREATININE 8.23*  CALCIUM 8.5*  PROT 7.7  BILITOT 4.3*  ALKPHOS 63  ALT 28  AST 105*  GLUCOSE 101*   Lab Results  Component Value Date   CHOL 114 03/15/2017   HDL 27 (L) 03/15/2017   LDLCALC 66 03/15/2017   TRIG 105 03/15/2017   No results found for: [facility]   Radiology Studies    Ct Abdomen Pelvis Wo Contrast  Result Date: 04/10/2017 CLINICAL DATA:  Abdominal pain. History of renal cysts, abdominal aortic aneurysm, hiatal hernia, chronic kidney disease. EXAM: CT ABDOMEN AND PELVIS WITHOUT CONTRAST TECHNIQUE: Multidetector CT imaging of the abdomen and pelvis was performed following the standard protocol without IV contrast. COMPARISON:  CT abdomen and pelvis September 26, 2016 FINDINGS: LOWER CHEST: New small to moderate pleural effusions with suspected loculation on the RIGHT. The heart is mildly enlarged. At least mild coronary artery calcifications versus stent. HEPATOBILIARY: Dense liver seen with amiodarone use, slightly nodular contour with atrophy. Normal noncontrast CT gallbladder. PANCREAS: Normal. SPLEEN: Normal. ADRENALS/URINARY TRACT: Kidneys are orthotopic,, mildly atrophic LEFT kidney kidneys are orthotopic. Bilateral renal cysts measuring to 5.7 cm upper pole RIGHT kidney. 2 mm RIGHT  lower pole nephrolithiasis. Bilateral vascular calcifications. Kicked No hydronephrosis; limited assessment for renal masses on this nonenhanced examination. The unopacified ureters are normal in course and caliber. Urinary bladder is partially distended and unremarkable. Stable 18 mm benign LEFT adrenal adenoma (-22 Hounsfield units). STOMACH/BOWEL: Small hiatal hernia. The stomach, small and large bowel are normal in course and caliber without inflammatory changes. Moderate sigmoid diverticulosis. The appendix is not discretely identified, however there are no inflammatory changes in the right lower quadrant. 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 Severe calcific atherosclerosis. Status post fem-fem bypass. REPRODUCTIVE: Normal. OTHER: Moderate volume ascites.  No intraperitoneal free air. MUSCULOSKELETAL: Anasarca. Multiple subcentimeter nodules anterior abdominal wall subcutaneous fat, possible granulomas. Small bilateral fat containing inguinal hernias. Surgical clips RIGHT inguinal soft tissues compatible with prior vascular access. 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  Anasarca. Nonobstructing 2 mm RIGHT lower pole, bilateral renal vascular calcifications. New small to moderate pleural effusions with suspected loculation on the RIGHT. Severe atherosclerosis. Electronically Signed   By: Elon Alas M.D.   On: 04/10/2017 22:47   Dg Chest 2 View  Result Date: 04/10/2017 CLINICAL DATA:  Nausea and lightheadedness for 3 days. History of atrial fibrillation, COPD. EXAM: CHEST  2 VIEW COMPARISON:  Chest radiograph March 28, 2017 inch chest radiograph November 27, 2015 FINDINGS: Cardiac silhouette is moderately enlarged and unchanged. Mediastinal silhouette is nonsuspicious. Status post median sternotomy for CABG.  Calcified aortic knob. Rounded RIGHT lung base pleural density increased from prior  CT. Mild chronic bronchitic changes, increased lung volumes with bibasilar scarring. No pneumothorax. Osteopenia. IMPRESSION: Increasing pleural-based suspected loculated pleural effusion though, mass not excluded. Recommend CT chest with contrast. Stable cardiomegaly and COPD with bibasilar atelectasis/ scarring. Electronically Signed   By: Elon Alas M.D.   On: 04/10/2017 22:33   Dg Chest 2 View  Result Date: 03/28/2017 CLINICAL DATA:  Cough congestion and shortness of breath EXAM: CHEST  2 VIEW COMPARISON:  11/27/2015 FINDINGS: Post sternotomy changes. Small bilateral pleural effusions. Peripheral right lower lung opacity. Streaky atelectasis or mild infiltrate at the left base as well. Borderline cardiomegaly with atherosclerosis. No pneumothorax. Degenerative changes of the spine. IMPRESSION: 1. Small bilateral effusions with peripherally based opacity in the right lower lung, possibly due to infiltrate or loculated pleural fluid. Streaky atelectasis or infiltrate at the left base. Imaging follow-up to resolution is recommended. 2. Borderline cardiomegaly Electronically Signed   By: Donavan Foil M.D.   On: 03/28/2017 22:59   Ct Chest Wo Contrast  Result Date: 04/12/2017 CLINICAL DATA:  Inpatient.  Chronic productive cough. EXAM: CT CHEST WITHOUT CONTRAST TECHNIQUE: Multidetector CT imaging of the chest was performed following the standard protocol without IV contrast. COMPARISON:  Chest radiograph from one day prior. 03/02/2013 chest CT. FINDINGS: Cardiovascular: Mild cardiomegaly. No significant pericardial fluid/thickening. Aortic valvular prosthesis is in place. Left main, left anterior descending, left circumflex and right coronary atherosclerosis status post CABG. Atherosclerotic thoracic aorta with 4.3 cm ectatic ascending thoracic aorta. Top-normal caliber main pulmonary artery (3.1 cm diameter).  Mediastinum/Nodes: No discrete thyroid nodules. Unremarkable esophagus. No axillary adenopathy. Mild right paratracheal adenopathy measuring up to 1.0 cm (series 3/ image 29). No additional pathologically enlarged mediastinal or gross hilar nodes on this noncontrast scan. Lungs/Pleura: No pneumothorax. Calcified pleural plaque in the medial posterior right pleural space. No left-sided calcified pleural plaques. There are small dependent bilateral pleural effusions, probably loculated bilaterally with small loculated component in the superior portion of the right major fissure (series 3/image 80). Mild paraseptal emphysema. Mild-to-moderate compressive atelectasis in the bilateral lower lobes. No lung masses or significant pulmonary nodules in the aerated portions of the lungs. Upper abdomen: Small hiatal hernia. Liver surface appears finely irregular, cannot exclude cirrhosis. Small volume ascites in the visualized upper abdomen. Multiple simple appearing renal cysts in the upper kidneys bilaterally, largest 5.7 cm in the posterior upper right kidney. Musculoskeletal: No aggressive appearing focal osseous lesions. Moderate thoracic spondylosis. Sternotomy wires appear intact. Moderate anasarca. IMPRESSION: 1. Right-sided calcified pleural plaque, which is nonspecific with the differential including asbestos related pleural disease (usually bilateral) versus remote pleural insult (trauma or infection). 2. Small dependent bilateral pleural effusions, probably loculated bilaterally with small loculated component in the superior right major fissure. 3. Mild cardiomegaly. 4. Possible cirrhosis.  Small volume upper abdominal ascites. 5. Moderate anasarca. 6. 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 Recommend annual imaging followup by CTA or MRA. This recommendation follows 2010 ACCF/AHA/AATS/ACR/ASA/SCA/SCAI/SIR/STS/SVM Guidelines for the Diagnosis and Management of Patients with Thoracic Aortic Disease. Circulation.  2010; 121: D326-Z124. 7. Mild mediastinal lymphadenopathy, nonspecific, probably reactive. 8. Small hiatal hernia. Aortic Atherosclerosis (ICD10-I70.0) and Emphysema (ICD10-J43.9). Electronically Signed   By: Ilona Sorrel M.D.   On: 04/12/2017 09:21   Ir Fluoro Guide Cv Line Right  Result Date: 04/12/2017 INDICATION: Acute on chronic renal failure, no current access for dialysis EXAM: ULTRASOUND GUIDANCE FOR VASCULAR ACCESS RIGHT INTERNAL JUGULAR PERMANENT HEMODIALYSIS CATHETER Date:  6/26/20186/26/2018 3:00 pm Radiologist:  M. Daryll Brod, MD Guidance:  Ultrasound and fluoroscopic FLUOROSCOPY TIME:  Fluoroscopy Time: 43 seconds (10 mGy). MEDICATIONS: Ancef 2 g administered within 1 hour of the procedure ANESTHESIA/SEDATION: Versed 0.5 mg IV; Fentanyl 12.5 mcg IV; Moderate Sedation Time:  26 minutes The patient was continuously monitored during the procedure by the interventional radiology nurse under my direct supervision. CONTRAST:  None. COMPLICATIONS: None immediate. PROCEDURE: Informed consent was obtained from the patient following explanation of the procedure, risks, benefits and alternatives. The patient understands, agrees and consents for the procedure. All questions were addressed. A time out was performed. Maximal barrier sterile technique utilized including caps, mask, sterile gowns, sterile gloves, large sterile drape, hand hygiene, and 2% chlorhexidine scrub. Under sterile conditions and local anesthesia, right internal jugular micropuncture venous access was performed with ultrasound. Images were obtained for documentation. A guide wire was inserted followed by a transitional dilator. Next, a 0.035 guidewire was advanced into the IVC with a 5-French catheter. Measurements were obtained from the right venotomy site to the proximal right atrium. In the right infraclavicular chest, a subcutaneous tunnel was created under sterile conditions and local anesthesia. 1% lidocaine with epinephrine was  utilized for this. The 23 cm tip to cuff palindrome catheter was tunneled subcutaneously to the venotomy site and inserted into the SVC/RA junction through a valved peel-away sheath. Position was confirmed with fluoroscopy. Images were obtained for documentation. Blood was aspirated from the catheter followed by saline and heparin flushes. The appropriate volume and strength of heparin was instilled in each lumen. Caps were applied. The catheter was secured at the tunnel site with Gelfoam and a pursestring suture. The venotomy site was closed with subcuticular Vicryl suture. Dermabond was applied to the small right neck incision. A dry sterile dressing was applied. The catheter is ready for use. No immediate complications. IMPRESSION: Ultrasound and fluoroscopically guided right internal jugular tunneled hemodialysis catheter (23 cm tip to cuff palindrome catheter). Electronically Signed   By: Jerilynn Mages.  Shick M.D.   On: 04/12/2017 15:08   Ir US Guide Vasc Access Right  Result Date: 04/12/2017 INDICATION: Acute on chronic renal failure, no current access for dialysis EXAM: ULTRASOUND GUIDANCE FOR VASCULAR ACCESS RIGHT INTERNAL JUGULAR PERMANENT HEMODIALYSIS CATHETER Date:  6/26/20186/26/2018 3:00 pm Radiologist:  M. Daryll Brod, MD Guidance:  Ultrasound and fluoroscopic FLUOROSCOPY TIME:  Fluoroscopy Time: 43 seconds (10 mGy). MEDICATIONS: Ancef 2 g administered within 1 hour of the procedure ANESTHESIA/SEDATION: Versed 0.5 mg IV; Fentanyl 12.5 mcg IV; Moderate Sedation Time:  26 minutes The patient was continuously monitored during the procedure by the interventional radiology nurse under my direct supervision. CONTRAST:  None. COMPLICATIONS: None immediate. PROCEDURE: Informed consent was obtained from the patient following explanation of the procedure, risks, benefits and alternatives. The patient understands, agrees and consents for the procedure. All questions were addressed. A time out was performed. Maximal  barrier sterile technique utilized including caps, mask, sterile gowns, sterile gloves, large sterile drape, hand hygiene, and 2% chlorhexidine scrub. Under sterile conditions and local anesthesia, right internal jugular micropuncture venous access was performed with ultrasound. Images were obtained for documentation. A guide wire was inserted followed by a transitional dilator. Next, a 0.035 guidewire was advanced into the IVC with a 5-French catheter. Measurements were obtained from the right venotomy site to the proximal right atrium. In the right infraclavicular chest, a subcutaneous tunnel was created under sterile conditions and local anesthesia. 1% lidocaine with epinephrine was utilized for this. The 23 cm tip to cuff palindrome catheter was tunneled subcutaneously to  the venotomy site and inserted into the SVC/RA junction through a valved peel-away sheath. Position was confirmed with fluoroscopy. Images were obtained for documentation. Blood was aspirated from the catheter followed by saline and heparin flushes. The appropriate volume and strength of heparin was instilled in each lumen. Caps were applied. The catheter was secured at the tunnel site with Gelfoam and a pursestring suture. The venotomy site was closed with subcuticular Vicryl suture. Dermabond was applied to the small right neck incision. A dry sterile dressing was applied. The catheter is ready for use. No immediate complications. IMPRESSION: Ultrasound and fluoroscopically guided right internal jugular tunneled hemodialysis catheter (23 cm tip to cuff palindrome catheter). Electronically Signed   By: Jerilynn Mages.  Shick M.D.   On: 04/12/2017 15:08    ECG & Cardiac Imaging   EKG 04/11/17: Afib, LBBB   Echocardiogram 04/14/17: Study Conclusions - Left ventricle: Poor acoiustic windows limit study, even with   Definity. LVEF is approximately 35%with hypokinesis worse inf the   inferior and septal walls. The cavity size was severely dilated.    Wall thickness was normal. - Aortic valve: Poor acoustic windows limit study AV prosthesis is   difficult to see Peak and mean gradients through the valve are 35   and 18 mm Hg respectively. There is trace perivalvular AI. There   was mild regurgitation. Valve area (Vmean): 0.62 cm^2. - Mitral valve: Calcified annulus. Mildly thickened leaflets .   There was mild regurgitation. - Left atrium: The atrium was moderately dilated. - Right ventricle: Systolic function was mildly reduced. - Right atrium: The atrium was severely dilated. - Pulmonary arteries: PA peak pressure: 37 mm Hg (S). - Pericardium, extracardiac: A trivial pericardial effusion was   identified.  Echocardiogram 05/20/16 Study Conclusions - Left ventricle: The cavity size was mildly dilated. Wall   thickness was normal. The estimated ejection fraction was 45%.   Basal to mid inferior severe hypokinesis. Inferolateral wall   poorly visualized. Indeterminant diastolic function. - Aortic valve: St Jude mechanical aortic valve. Mildly elevated   gradient across the mechanical aortic valve. There was trivial   regurgitation. Mean gradient (S): 19 mm Hg. Peak gradient (S): 31   mm Hg. - Mitral valve: Mildly to moderately calcified annulus. There was   trivial regurgitation. - Left atrium: The atrium was mildly dilated. - Right ventricle: The cavity size was normal. Systolic function   was mildly reduced. - Right atrium: The atrium was mildly dilated. - Tricuspid valve: Peak RV-RA gradient (S): 30 mm Hg. - Pulmonary arteries: PA peak pressure: 45 mm Hg (S). - Systemic veins: IVC measured 2.4 cm with < 50% respirophasic   variation, suggesting RA pressure 15 mmHg. .  Impressions: - Mildly dilated LV with EF 45%. Basal to mid inferior severe   hypokinesis, inferolateral wall poorly visualized. Normal RV size   with mildly decreased systolic function. Mild pulmonary   hypertension. Dilated IVC suggesting elevated RV  filling   pressure. Mechanical aortic valve with mildly elevated mean   gradient.  Left Heart Cath 11/2008:  Coronary arteriography:  1. Grafts.  The internal mammary artery to the LAD was widely patent.      The LAD was well visualized, had only minimal irregularities.      There was some collateral visualization of both the small OM and      the right coronary artery.  2. Saphenous vein graft to the RCA, 100% occluded at its ostium.  3. Saphenous vein graft to OM,  100% occluded at its ostium.  4. Native right coronary artery, 100% occluded proximally.  5. Left main.  There was terminal 40-50% narrowing of the left main      noted in the LAO cranial view only.  When compared to the 2005      cardiac cath, this has not significantly changed.  6. LAD.  The LAD system gives rise to a first diagonal branch that is      large and free of significant disease.  The LAD itself is 100%      occluded right after the diagonal, but is well visualized via the      internal mammary artery to the LAD.  There was a large left-to-      right collateral bed noted from the left circulation.  7. Circumflex.  There appeared to be some ostial narrowing of the      circumflex and it was mildly hyperlucent (40-50%).  This too is      unchanged from the 2005 cath.  OM #1 had proximal 30% and 40% areas      of narrowing and the distal OM bifurcated once free of disease.  OM      #2 was also a bifurcating vessel and free of disease.  There were      large left-to-right collaterals.   CONCLUSION:  Loss of 2/3 bypass grafts with patent internal mammary  artery to the LAD.  There is large collateral filling of the RCA via  both the native and the internal mammary artery to the LAD.   The only areas that could explain his pain would either be related to  the left main or the ostial circ, but this does not appear to have  changed angiographically since 2005.  Assessment & Plan    1. Chronic systolic heart  failure - echo this admission with LVEF 35%, down from 45% in 2017 - cardiology will defer fluid management to nephrology and HD, holding bumex for now - he will not tolerate a beta blocker given his chronic Afib with slow ventricular response - his pressures are 110-120s and will likely not tolerate titrating other heart failure medications, especially in the setting of new HD   2. CAD s/p CABG - he is chest pain-free - troponin mildly elevated 0.03 --> 0.03 --> 0.05, likely related to demand ischemia in the setting of anemia and GI bleed - no ischemic evaluation warranted at this time   3. AS s/p AVR - mechanical valve and chronic atrial fibrillation - coumadin with heparin bridge has been started - Hb remains stable at 8.5 - INR 1.55 (1.58), managed by pharmacy - echo with adequate valve function, although difficult to visualize - continue amiodarone   4. 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 Right loculated pleural effusion by CT - this may be contributing to his SOB along with reduced EF - defer to primary team   7. HTN - pressures controlled without antihypertensives, sBP 110-120s   Signed, Minus Breeding, PA-C 04/15/2017, 3:59 PM [phone]  History and all data above reviewed.  Patient examined.  I agree with the findings as above.  He reports no acute recent cardiac  complaints.  It sounds like he gets around his house with a walker and has had recent falls.  I suspect progressive frailty but I have not seen him before.  He does not report any new chest pain, palpitations or dyspnea.  He does not report PND or orthopnea.  He does not report syncope.  He is admitted with GI bleeding.  His EF is 35% on  echo.  I reviewed the echo this admit and from previous and I agree that the EF is slightly lower than the previous 45%.  He has no significant enzyme bump.  His EKG is chronic LBBB.  There is underlying atrial fib.  He has acute on chronic renal failure and is requiring dialysis which is new.    The patient exam reveals RRN:[id number]  ,  Lungs: Decreased breath sounds  ,  Abd: Positive bowel sounds, no rebound no guarding, Ext Diffuse edema  .  All available labs, radiology testing, previous records reviewed. Agree with documented assessment and plan. Atrial fib:  Rate is controlled.  He has been restarted on warfarin and heparin.  We need to follow this closely as an out patient as he has had  frequent falls by his report and is obviously high risk for anticoagulation. Not on beta blocker or amiodarone and rate is controlled.  Decreased EF:  This is slightly lower than before.  Volume management per dialysis.  He will not tolerate much in the way of med titration.  Start hydralazine nitrates over the weekend if BP allows.     Jeneen Rinks Coleen Cardiff  4:12 PM  04/15/2017

## 2017-04-15 NOTE — Progress Notes (Signed)
ANTICOAGULATION CONSULT NOTE - Follow Up Consult  Pharmacy Consult for Heparin and Coumadin Indication: atrial fibrillation and mechanical atrial valve  Allergies  Allergen Reactions  . Chantix [Varenicline] Nausea And Vomiting  . Lyrica [Pregabalin] Other (See Comments)    Swelling, couldn't breathe  . Nsaids Other (See Comments)    Cannot take while on coumadin  . Simvastatin Other (See Comments)    Pt said he had a "bleed out" after taking it  . Ziac [Bisoprolol-Hydrochlorothiazide] Other (See Comments)    fatigue    Patient Measurements: Height: 5\' 8"  (172.7 cm) Weight: 231 lb 7.7 oz (105 kg) IBW/kg (Calculated) : 68.4 Heparin Dosing Weight: 93 kg  Vital Signs: Temp: 98.4 F (36.9 C) (06/29 1421) BP: 102/45 (06/29 1421) Pulse Rate: 73 (06/29 1421)  Labs:  Recent Labs  04/13/17 0867 04/13/17 0715  04/13/17 0716 04/13/17 2047 04/14/17 0623 04/15/17 0525 04/15/17 1207  HGB  --   --   < > 8.5*  --  8.5* 8.5*  --   HCT  --   --   --  25.8*  --  25.8* 25.2*  --   PLT  --   --   --  193  --  157 144*  --   LABPROT 17.8*  --   --   --   --  19.0* 18.8*  --   INR 1.45  --   --   --   --  1.58 1.55  --   HEPARINUNFRC  --   --   --   --  0.28* 0.31  --  0.28*  CREATININE  --  11.80*  --   --   --  10.21* 8.23*  --   < > = values in this interval not displayed.  Estimated Creatinine Clearance: 9.2 mL/min (A) (by C-G formula based on SCr of 8.23 mg/dL (H)).  Assessment:  76 yo M admitted 6/24 with rectal bleeding.  Pt was on Coumadin PTA for hx mechanical AVR and afib.  Coumadin was held and reversed with Vitamin K and FFP in the setting of acute GIB and supratherapeutic INR. Bleeding gastric AVM successfully treated. Noted large hiatal hernia with [name] erosions. GI notes expect chronic low-grade oozing over time. On Protonix 40 mg daily, which is to continue indefinitely, as well as oral iron BID.  Currently off oral iron -> day #2 of 5 IV iron.  Off ASA 81 mg. Also  off Amiodarone, which may also effect Coumadin requirements.    Anticoagulation with heparin >> Coumadin was restarted 6/27.    IV access lost briefly overnight.  Heparin level ~8 hrs after restart is 0.28 on 1200 units/hr, just below target.   INR 1.55 after Coumadin 2.5 mg x 2 days. About the same as yesterday.  T bili trended up to 4.3,  AST with slight trend down.    Most recent Coumadin regimen:  2.5 mg daily except none on Mondays and Thursdays.  Last outpatient INR was 3.3 on 04/05/17 on 2.5 mg daily. Regimen adjusted that day to skip Coumadin 2 days a week. Confirmed with Dr. Idell Pickles office.     Goal of Therapy: INR 2-3 (prefer 2-2.5 with recent bleed) Heparin level 0.3-0.5 units/ml (with recent bleed) Monitor platelets by anticoagulation protocol: Yes   Plan:   Increase heparin drip to 1250 units/hr.  Coumadin 2.5 mg again today.  Will consider increasing Coumadin dose on 6/30 if INR not increasing.  Daily heparin level, PT/INR and CBC.  Monitor for any bleeding.   Arty Baumgartner, [place] Pager: [phone] 04/15/2017,3:02 PM

## 2017-04-15 NOTE — Clinical Social Work Note (Signed)
Clinical Social Work Assessment  Patient Details  Name: Robert Stanton MRN: [id number] Date of Birth: 04/20/1941  Date of referral:  04/15/17               Reason for consult:  Facility Placement                Permission sought to share information with:  Facility Sport and exercise psychologist, Family Supports Permission granted to share information::  Yes, Verbal Permission Granted  Name::     Chartered certified accountant::  SNFs  Relationship::  Spouse  Contact Information:  [phone]  Housing/Transportation Living arrangements for the past 2 months:  Single Family Home Source of Information:  Patient, Spouse Patient Interpreter Needed:  None Criminal Activity/Legal Involvement Pertinent to Current Situation/Hospitalization:  No - Comment as needed Significant Relationships:  Adult Children, Spouse Lives with:  Adult Children, Spouse Do you feel safe going back to the place where you live?  No Need for family participation in patient care:  Yes (Comment)  Care giving concerns:  CSW received consult for possible SNF placement at time of discharge. CSW spoke with patient regarding PT recommendation of SNF placement at time of discharge. Patient reported that patient's spouse is currently unable to care for patient at their home given patient's current physical needs and fall risk. Patient expressed understanding of PT recommendation and is agreeable to SNF placement at time of discharge. He asked CSW to contact his wife regarding options. CSW spoke with patient's wife. She reported that she is house-bound and their son has to take care of her and another son who is also wheelchair bound. She reported that they do not have money to pay for SNF. CSW explained that insurance would be authorized to se if they will pay for rehab. She agreed to send referral to SNFs. CSW to continue to follow and assist with discharge planning needs.   Social Worker assessment / plan:  CSW spoke with patient and his wife  concerning possibility of rehab at [facility] before returning home.  Employment status:  Retired Surveyor, [employer] Care PT Recommendations:  [place] / Referral to community resources:  [facility] [place]  Patient/Family's Response to care:  Patient recognizes need for rehab before returning home and is agreeable to a SNF in [place]. Patient reported preference for [place] so his wife doesn't have far to visit him. She expressed appreciation for CSW help.  Patient/Family's Understanding of and Emotional Response to Diagnosis, Current Treatment, and Prognosis:  Patient/family is realistic regarding therapy needs and expressed being hopeful for SNF placement. Patient expressed understanding of CSW role and discharge process as well as his medical needs. No questions/concerns about plan or treatment.    Emotional Assessment Appearance:  Appears stated age Attitude/Demeanor/Rapport:  Other (Appropriate) Affect (typically observed):  Accepting, Appropriate Orientation:  Oriented to Self, Oriented to Situation, Oriented to Place, Oriented to  Time Alcohol / Substance use:  Not Applicable Psych involvement (Current and /or in the community):  No (Comment)  Discharge Needs  Concerns to be addressed:  Care Coordination Readmission within the last 30 days:  No Current discharge risk:  None Barriers to Discharge:  Continued Medical Work up   [employer], [place] 04/15/2017, 5:52 PM

## 2017-04-15 NOTE — Progress Notes (Signed)
PROGRESS NOTE    Robert Stanton  VZD:[id number] DOB: 10-06-1941 DOA: 04/10/2017 PCP: Robert Pinto, MD   Chief Complaint  Patient presents with  . Nausea  . Dizziness    Brief Narrative:  HPI on 04/10/2017 by Dr. Jenetta Stanton Robert Stanton is a 76 y.o. male with medical history significant for- hypertension, coronary artery disease- CABG- 2010,  atrial fibrillation CKD5, hypertension, esophageal strictures, gout, COPD, OSA, mechanical aortic valve, hypothyroidism, peripheral artery disease, type II DM. Patient came in with complaints of bleeding from his rectum was started about 3 days ago, he also reports dark stools- 3-4 days. He denies abdominal pain. In the ED he had an episode of vomiting- with coffee-ground emesis.  He endorses dizziness that started Saturday morning- 04/09/17. This appears to be a chronic issue for patient. Pt denies NSAID use. Patient reports a history of peptic ulcer disease diagnosed with EGD- several years ago. Last EGD 10/2012- was esophageal stricture and erosions, erosive gastritis. Last colonoscopy was about 10 years ago- reports it was normal. He also notes a chronic cough that is productive today, though he has a history of COPD. His shortness of breath is at baseline. He denies fever or chills.  Interim history Patient was found to have heme positive stools, with increasing creatinine and elevated INR. He was admitted for evaluation of GI bleeding. Patient underwent EGD showing AVMs status post hemostatic endoscopy with APC. Nephrology also consulted and hemodialysis initiated.  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 Esophageal stricture without esophagitis. Moderately large hiatal hernia with Robert Stanton erosions. -Per gastroenterology, patient may resume  anticoagulation -Hemoglobin currently 8.5 (baseline hemoglobin 8-9) -Continue to monitor CBC -Will start on supplementation 325mg  BID -Per GI recommendations, Protonix indefinitely and iron supplementation   Acute renal failure on chronic kidney disease, stage IV with oliguria -Creatinine continued to worsen, peaked at 11.8 -Nephrology consult and appreciated, hemodialysis initiated- will continue today -Patient was given IV diuresis however remained oliguric with fluid overload -Interventional radiology consulted and appreciated for HD catheter placement -Of note, patient did have aVF on left upper extremity 03/04/2017 -Placed on phoslo, renavit  Acute on Chronic systolic heart failure -Echocardiogram EF 35% with hypokinesis worse in the inferior and septal walls -Last echocardiogram 05/20/2016 showed an EF of 45%, basal to mid inferior severe hypokinesis. Indeterminate diastolic function -Patient was given diuresis per nephrology however patient continued to remain oliguric -Continue volume control with HD -Monitor intake and output, daily weights -Cardiology consulted and appreciated  Right loculated pleural effusion -No signs of an infection at this time -CT chest: Right-sided calcified pleural plaque, nonspecific with differential including asbestosis related pleural disease versus pleural insult such as trauma or infection. Small bilateral pleural effusions, probably loculated bilaterally with small loculated components. Right major fissure.  Atrial fibrillation/aortic valve replacement -Patient presented with supratherapeutic INR, given vitamin K as well as fresh frozen plasma -INR currently 1.55 -Restarted coumadin with conservative heparin bridge -hemoglobin currently stable  Essential hypertension -BP currently stable, was hypotensive -Continue to monitor closely and start IV medications as needed  Diabetes mellitus, type II -Hemoglobin A1c was 5.1 on  03/05/2017  Atherosclerotic thoracic aorta -4.3 cm ectatic ascending thoracic aorta noted on CT scan -Patient will need annual imaging by CTA or MRA  Possible cirrhosis/ elevated AST -Noted on CT chest -AST improving slightly, currently 105 (per  patient and son, no longer consumes alcohol) -Suspect elevated AST secondary to medications -Reviewed medications with pharmacy. One, Synthroid, Protonix, Lasix may all increased LFTs. CRP also increase AST. Lasix has been discontinued. Given that patient currently requires Protonix for GI bleed as well as heparin for bridging of Coumadin, will continue these medications and to monitor LFTs closely.  Mildly elevated troponin -Likely secondary to the above, currently chest pain-free -Troponin elevation not consistent with ACS -Echocardiogram as above  Hypothyroidism -Continue Synthroid  Nausea -possibly related to the start of hemodialysis -Currently denies further nausea, Zofran has been ordered however patient has not used.  DVT Prophylaxis  Coumadin/heparin  Code Status: Full  Family Communication: Son at bedside  Disposition Plan: Admitted. Dispo pending.  Consultants Nephrology Gastroenterology Interventional radiology   Procedures  EGD  Right IJ HD tunneled catheter placement  Antibiotics   Anti-infectives    Start     Dose/Rate Route Frequency Ordered Stop   04/12/17 1407  ceFAZolin (ANCEF) 2-4 GM/100ML-% IVPB    Comments:  Stanton, Robert   : cabinet override      04/12/17 1407 04/13/17 0214   04/12/17 1100  ceFAZolin (ANCEF) IVPB 2g/100 mL premix     2 g 200 mL/hr over 30 Minutes Intravenous On call 04/12/17 1019 04/12/17 1443      Subjective:   Robert Stanton seen and examined today in dialysis. Patient feeling mildly better today. Denies any current chest pain, shortness breath, abdominal pain, nausea or vomiting, dizziness or headache.  Objective:   Vitals:   04/15/17 0027 04/15/17 0235 04/15/17 0620  04/15/17 0812  BP:  (!) 119/57 (!) 121/46   Pulse: 79 78 80   Resp: 18 20 18    Temp:  98.3 F (36.8 C) 98.1 F (36.7 C)   TempSrc:  Oral    SpO2: 95% 97% 95% 97%  Weight:      Height:        Intake/Output Summary (Last 24 hours) at 04/15/17 1410 Last data filed at 04/15/17 1355  Gross per 24 hour  Intake           658.25 ml  Output                0 ml  Net           658.25 ml   Filed Weights   04/13/17 0953 04/14/17 1520 04/14/17 1820  Weight: 107 kg (235 lb 14.3 oz) 107 kg (235 lb 14.3 oz) 105 kg (231 lb 7.7 oz)   Exam  General: Well developed, ill-appearing, NAD  HEENT: NCAT,mucous membranes moist.   Cardiovascular: S1 S2 auscultated, aortic valve click, irregular  Respiratory: Diminished breath sounds, with inspiratory/expiratory wheezing   Abdomen: Soft, nontender, nondistended, + bowel sounds  Extremities: warm dry without cyanosis clubbing. +1LE edema  Neuro: AAOx3, nonfocal  Psych: appropriate mood and affect  Data Reviewed: I have personally reviewed following labs and imaging studies  CBC:  Recent Labs Lab 04/12/17 0154 04/12/17 1023 04/13/17 0716 04/14/17 0623 04/15/17 0525  WBC 8.0 9.6 9.4 8.4 7.4  HGB 8.3* 8.5* 8.5* 8.5* 8.5*  HCT 25.2* 26.1* 25.8* 25.8* 25.2*  MCV 93.0 93.5 93.8 93.8 95.1  PLT 169 185 193 157 412*   Basic Metabolic Panel:  Recent Labs Lab 04/11/17 0911 04/12/17 0154 04/13/17 0715 04/14/17 0623 04/15/17 0525  NA 131* 132* 133* 131* 134*  K 3.9 4.0 4.3 4.3 3.9  CL 92* 93* 92* 93* 93*  CO2 21* 21* 22  21* 25  GLUCOSE 111* 129* 118* 107* 101*  BUN 141* 144* 155* 111* 73*  CREATININE 10.23* 10.95* 11.80* 10.21* 8.23*  CALCIUM 8.5* 8.4* 8.0* 8.3* 8.5*  PHOS  --  7.0* 8.2*  --   --    GFR: Estimated Creatinine Clearance: 9.2 mL/min (A) (by C-G formula based on SCr of 8.23 mg/dL (H)). Liver Function Tests:  Recent Labs Lab 04/10/17 2003 04/12/17 0154 04/13/17 2778 04/13/17 0715 04/14/17 0623 04/15/17 0525   AST 69*  --   --   --  126* 105*  ALT 26  --  51  --  39 28  ALKPHOS 69  --   --   --  63 63  BILITOT 1.8*  --   --   --  3.9* 4.3*  PROT 7.3  --   --   --  7.3 7.7  ALBUMIN 2.8* 2.9*  --  2.9* 2.9* 3.0*    Recent Labs Lab 04/10/17 2046  LIPASE 45   No results for input(s): AMMONIA in the last 168 hours. Coagulation Profile:  Recent Labs Lab 04/11/17 0646 04/12/17 0154 04/13/17 0714 04/14/17 0623 04/15/17 0525  INR 1.93 1.54 1.45 1.58 1.55   Cardiac Enzymes:  Recent Labs Lab 04/11/17 0216 04/11/17 0646 04/11/17 1425  TROPONINI 0.03* 0.03* 0.05*   BNP (last 3 results) No results for input(s): PROBNP in the last 8760 hours. HbA1C: No results for input(s): HGBA1C in the last 72 hours. CBG: No results for input(s): GLUCAP in the last 168 hours. Lipid Profile: No results for input(s): CHOL, HDL, LDLCALC, TRIG, CHOLHDL, LDLDIRECT in the last 72 hours. Thyroid Function Tests: No results for input(s): TSH, T4TOTAL, FREET4, T3FREE, THYROIDAB in the last 72 hours. Anemia Panel: No results for input(s): VITAMINB12, FOLATE, FERRITIN, TIBC, IRON, RETICCTPCT in the last 72 hours. Urine analysis:    Component Value Date/Time   COLORURINE YELLOW 09/26/2016 1939   APPEARANCEUR CLEAR 09/26/2016 1939   LABSPEC 1.005 09/26/2016 1939   PHURINE 5.0 09/26/2016 1939   GLUCOSEU NEGATIVE 09/26/2016 1939   HGBUR MODERATE (A) 09/26/2016 1939   BILIRUBINUR NEGATIVE 09/26/2016 1939   KETONESUR NEGATIVE 09/26/2016 1939   PROTEINUR NEGATIVE 09/26/2016 1939   NITRITE NEGATIVE 09/26/2016 1939   LEUKOCYTESUR NEGATIVE 09/26/2016 1939   Sepsis Labs: @LABRCNTIP (procalcitonin:4,lacticidven:4)  )No results found for this or any previous visit (from the past 240 hour(s)).    Radiology Studies: No results found.   Scheduled Meds: . albuterol  2.5 mg Nebulization TID  . calcium acetate  1,334 mg Oral TID WC  . levothyroxine  50 mcg Oral QAC breakfast  . montelukast  10 mg Oral QHS   . multivitamin  1 tablet Oral QHS  . pantoprazole  40 mg Oral Q0600  . Warfarin - Pharmacist Dosing Inpatient   Does not apply q1800   Continuous Infusions: . sodium chloride    . sodium chloride    . ferric gluconate (FERRLECIT/NULECIT) IV Stopped (04/15/17 1226)  . heparin 1,250 Units/hr (04/15/17 1348)     LOS: 5 days   Time Spent in minutes   40 minutes  Deloss Amico D.O. on 04/15/2017 at 2:10 PM  Between 7am to 7pm - Pager - [phone]  After 7pm go to [email or web address] - password [email or web address]  And look for the night coverage person covering for me after hours  [facility] Office  [phone]

## 2017-04-15 NOTE — Procedures (Signed)
Placed patient on CPAP for the night.  Patient is tolerating well at this time. 

## 2017-04-15 NOTE — NC FL2 (Signed)
[place] LEVEL OF CARE SCREENING TOOL     IDENTIFICATION  Patient Name: Robert Stanton Birthdate: [birth year] Sex: male Admission Date (Current Location): 04/10/2017  [facility] and [place] Number:  Herbalist and Address:  [facility]. [facility], [place] [address] [place],  [address]      Provider Number: [id number]  Attending Physician Name and Address:  Cristal Ford, DO  Relative Name and Phone Number:  Butch Penny, spouse, [phone]    Current Level of Care: Hospital Recommended Level of Care: [place] Prior Approval Number:    Date Approved/Denied:   PASRR Number: [id number]  Discharge Plan: SNF    Current Diagnoses: Patient Active Problem List   Diagnosis Date Noted  . GI bleed 04/11/2017  . 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 Acute gastric erosion   . Esophageal stricture   . Combined systolic and diastolic congestive heart failure ([place])   . Chronic kidney disease, stage V ([place]) 02/16/2017  . Dizziness 11/22/2016  . Hearing loss 11/22/2016  . Atherosclerosis of native coronary artery of native heart with unstable angina pectoris ([place]) 11/22/2016  . Chronic obstructive pulmonary disease ([place]) 09/08/2016  . Pre-operative cardiovascular examination 06/02/2016  . Medicare annual wellness visit, subsequent 08/18/2015  . Ventricular tachycardia (paroxysmal) ([place]) 06/11/2015  . Tobacco use disorder 05/15/2015  . Morbid obesity  (BMI 35.57) 05/15/2015  . Poor compliance 02/10/2015  . Hypothyroidism 10/07/2014  . Generalized anxiety disorder 10/03/2014  . Status post mechanical aortic valve replacement 09/02/2014  . Medication management 02/21/2014  . Vitamin D deficiency 02/21/2014  . Long term current use of anticoagulant therapy 01/16/2014  . Permanent atrial fibrillation ([place]) 12/04/2013  . 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 Peripheral arterial  disease ([place]) 12/02/2013  . Kidney cysts   . GERD (gastroesophageal reflux disease)   . Gout   . OSA and COPD overlap syndrome ([place])   . AAA (abdominal aortic aneurysm) ([place])   . Vitamin B deficiency   . Renal artery stenosis ([place]) 05/01/2013  . PAD,s/p aorto bifem BG, s/p Rt Fem PTA 3/15 04/06/2013  . COLONIC POLYPS 12/26/2007  . Hyperlipidemia 12/26/2007  . Essential hypertension 12/26/2007  . Coronary atherosclerosis 12/26/2007  . ESOPHAGEAL STRICTURE 12/26/2007    Orientation RESPIRATION BLADDER Height & Weight     Self, Time, Situation, Place  O2 (Nasal cannula 2L; CPAP at night) Continent Weight: 108 kg (238 lb 1.6 oz) Height:  5\' 8"  (172.7 cm)  BEHAVIORAL SYMPTOMS/MOOD NEUROLOGICAL BOWEL NUTRITION STATUS      Continent Diet (Please see DC Summary)  AMBULATORY STATUS COMMUNICATION OF NEEDS Skin   Extensive Assist Verbally Other (Comment) (Skin tear on arm)                       Personal Care Assistance Level of Assistance  Bathing, Feeding, Dressing Bathing Assistance: Maximum assistance Feeding assistance: Independent Dressing Assistance: Limited assistance     Functional Limitations Info  Hearing   Hearing Info: Impaired      SPECIAL CARE FACTORS FREQUENCY  PT (By licensed PT), OT (By licensed OT)     PT Frequency: 5x/week OT Frequency: 3x/week            Contractures      Additional Factors Info  Code Status, Allergies Code Status Info: Full Allergies Info: Chantix Varenicline, Lyrica Pregabalin, Nsaids, Simvastatin, Ziac Bisoprolol-hydrochlorothiazide  Current Medications (04/15/2017):  This is the current hospital active medication list Current Facility-Administered Medications  Medication Dose Route Frequency Provider Last Rate Last Dose  . 0.9 %  sodium chloride infusion  100 mL Intravenous PRN Estanislado Emms, MD      . 0.9 %  sodium chloride infusion  100 mL Intravenous PRN Estanislado Emms, MD      . albuterol (PROVENTIL)  (2.5 MG/3ML) 0.083% nebulizer solution 2.5 mg  2.5 mg Nebulization TID Patrecia Pour, Christean Grief, MD   2.5 mg at 04/15/17 0811  . alteplase (CATHFLO ACTIVASE) injection 2 mg  2 mg Intracatheter Once PRN Estanislado Emms, MD      . calcium acetate (PHOSLO) capsule 1,334 mg  1,334 mg Oral TID WC Estanislado Emms, MD   1,334 mg at 04/15/17 1219  . ferric gluconate (NULECIT) 125 mg in sodium chloride 0.9 % 100 mL IVPB  125 mg Intravenous Daily Estanislado Emms, MD   Stopped at 04/15/17 1226  . heparin ADULT infusion 100 units/mL (25000 units/276mL sodium chloride 0.45%)  1,250 Units/hr Intravenous Continuous Skeet Simmer, RPH 12.5 mL/hr at 04/15/17 1348 1,250 Units/hr at 04/15/17 1348  . heparin injection 1,000 Units  1,000 Units Dialysis PRN Estanislado Emms, MD      . heparin injection 2,100 Units  20 Units/kg Dialysis PRN Estanislado Emms, MD      . ipratropium-albuterol (DUONEB) 0.5-2.5 (3) MG/3ML nebulizer solution 3 mL  3 mL Nebulization Q4H PRN Cristal Ford, DO   3 mL at 04/14/17 0443  . levothyroxine (SYNTHROID, LEVOTHROID) tablet 50 mcg  50 mcg Oral QAC breakfast Skeet Simmer, RPH   50 mcg at 04/15/17 0815  . lidocaine (PF) (XYLOCAINE) 1 % injection 5 mL  5 mL Intradermal PRN Estanislado Emms, MD      . lidocaine-prilocaine (EMLA) cream 1 application  1 application Topical PRN Estanislado Emms, MD      . montelukast (SINGULAIR) tablet 10 mg  10 mg Oral QHS Emokpae, Ejiroghene E, MD   10 mg at 04/14/17 2232  . multivitamin (RENA-VIT) tablet 1 tablet  1 tablet Oral QHS Estanislado Emms, MD   1 tablet at 04/14/17 2231  . ondansetron (ZOFRAN) injection 4 mg  4 mg Intravenous Q6H PRN Cristal Ford, DO   4 mg at 04/15/17 1513  . pantoprazole (PROTONIX) EC tablet 40 mg  40 mg Oral Q0600 Vena Rua, PA-C   40 mg at 04/15/17 4196  . pentafluoroprop-tetrafluoroeth (GEBAUERS) aerosol 1 application  1 application Topical PRN Estanislado Emms, MD      . warfarin (COUMADIN) tablet 2.5 mg  2.5 mg Oral  ONCE-1800 Skeet Simmer, [facility]      . Warfarin - Pharmacist Dosing Inpatient   Does not apply q1800 Skeet Simmer, [facility]         Discharge Medications: Please see discharge summary for a list of discharge medications.  Relevant Imaging Results:  Relevant Lab Results:   Additional Information SSN: [id number]    New HD: [facility] 1st treatment Tuesday 04/19/17 at 11:10 am .Tentative dialysis schedule Tuesday,Thursday,Saturday 2nd shift .Chairtime at 12:10 pm  Benard Halsted, LCSWA

## 2017-04-15 NOTE — Progress Notes (Signed)
Patient going to HD. RN gave report to the nurse in HD.

## 2017-04-15 NOTE — Progress Notes (Signed)
Heart Valve was verified via his wife from his valve ID card:  [facility] [place] Jude Serial #: [id number] Model #: [id number]  Mechanical aortic valve    Tami Lin Cephas Revard, PA-C 04/15/2017, 5:10 PM [place]

## 2017-04-15 NOTE — Progress Notes (Signed)
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 Cirrhosis by CT 7 Gen weakness Plan: HD again today  Subjective: Interval History: -2500cc with HD  Objective: Vital signs in last 24 hours: Temp:  [97.4 F (36.3 Stanton)-98.3 F (36.8 Stanton)] 98.1 F (36.7 Stanton) (06/29 0620) Pulse Rate:  [70-97] 80 (06/29 0620) Resp:  [16-20] 18 (06/29 0620) BP: (108-136)/(46-82) 121/46 (06/29 0620) SpO2:  [95 %-98 %] 97 % (06/29 0812) Weight:  [105 kg (231 lb 7.7 oz)-107 kg (235 lb 14.3 oz)] 105 kg (231 lb 7.7 oz) (06/28 1820) Weight change: 0 kg (0 lb)  Intake/Output from previous day: 06/28 0701 - 06/29 0700 In: 678.3 [P.O.:340; I.V.:238.3; IV Piggyback:100] Out: -  Intake/Output this shift: Total I/O In: 60 [P.O.:60] Out: -   General appearance: alert and HOH Resp: diminished breath sounds bilaterally Cardio: regular rate and rhythm, S1, S2 normal, no murmur, click, rub or gallop  Edema 1+, LUE AVF fubctioning Gen debility  Lab Results:  Recent Labs  04/14/17 0623 04/15/17 0525  WBC 8.4 7.4  HGB 8.5* 8.5*  HCT 25.8* 25.2*  PLT 157 144*   BMET:  Recent Labs  04/14/17 0623 04/15/17 0525  NA 131* 134*  K 4.3 3.9  CL 93* 93*  CO2 21* 25  GLUCOSE 107* 101*  BUN 111* 73*  CREATININE 10.21* 8.23*  CALCIUM 8.3* 8.5*   No results for input(s): PTH in the last 72 hours. Iron Studies: No results for input(s): IRON, TIBC, TRANSFERRIN, FERRITIN in the last 72 hours. Studies/Results: No results found.  Scheduled: . albuterol  2.5 mg Nebulization TID  . calcium acetate  1,334 mg Oral TID WC  . levothyroxine  50 mcg Oral QAC breakfast  . montelukast  10 mg Oral QHS  . multivitamin  1 tablet Oral QHS  . pantoprazole  40 mg Oral Q0600  . Warfarin - Pharmacist Dosing Inpatient   Does not apply q1800     LOS: 5 days   Robert Stanton 04/15/2017,9:49 AM

## 2017-04-15 NOTE — Progress Notes (Signed)
Accepted at [place] 1st treatment Tuesday 04/19/17 at 11:10 am .Tentative dialysis schedule Tuesday,Thursday,Saturday 2nd shift .Chairtime at 12:10 pm

## 2017-04-16 ENCOUNTER — Inpatient Hospital Stay (HOSPITAL_COMMUNITY): Payer: PPO

## 2017-04-16 DIAGNOSIS — K922 Gastrointestinal hemorrhage, unspecified: Secondary | ICD-10-CM

## 2017-04-16 DIAGNOSIS — I2510 Atherosclerotic heart disease of native coronary artery without angina pectoris: Secondary | ICD-10-CM

## 2017-04-16 DIAGNOSIS — Z952 Presence of prosthetic heart valve: Secondary | ICD-10-CM

## 2017-04-16 DIAGNOSIS — I714 Abdominal aortic aneurysm, without rupture: Secondary | ICD-10-CM

## 2017-04-16 DIAGNOSIS — I739 Peripheral vascular disease, unspecified: Secondary | ICD-10-CM

## 2017-04-16 DIAGNOSIS — I482 Chronic atrial fibrillation: Secondary | ICD-10-CM

## 2017-04-16 DIAGNOSIS — N185 Chronic kidney disease, stage 5: Secondary | ICD-10-CM

## 2017-04-16 DIAGNOSIS — R0602 Shortness of breath: Secondary | ICD-10-CM

## 2017-04-16 LAB — COMPREHENSIVE METABOLIC PANEL
ALT: 25 U/L (ref 17–63)
AST: 106 U/L — AB (ref 15–41)
Albumin: 3 g/dL — ABNORMAL LOW (ref 3.5–5.0)
Alkaline Phosphatase: 73 U/L (ref 38–126)
Anion gap: 14 (ref 5–15)
BUN: 43 mg/dL — AB (ref 6–20)
CHLORIDE: 96 mmol/L — AB (ref 101–111)
CO2: 25 mmol/L (ref 22–32)
CREATININE: 6.06 mg/dL — AB (ref 0.61–1.24)
Calcium: 8.6 mg/dL — ABNORMAL LOW (ref 8.9–10.3)
GFR calc Af Amer: 9 mL/min — ABNORMAL LOW (ref >60)
GFR, EST NON AFRICAN AMERICAN: 8 mL/min — AB (ref >60)
Glucose, Bld: 113 mg/dL — ABNORMAL HIGH (ref 65–99)
Potassium: 4.2 mmol/L (ref 3.5–5.1)
Sodium: 135 mmol/L (ref 135–145)
Total Bilirubin: 4.7 mg/dL — ABNORMAL HIGH (ref 0.3–1.2)
Total Protein: 7.7 g/dL (ref 6.5–8.1)

## 2017-04-16 LAB — CBC
HEMATOCRIT: 26.5 % — AB (ref 39.0–52.0)
HEMOGLOBIN: 8.6 g/dL — AB (ref 13.0–17.0)
MCH: 31.7 pg (ref 26.0–34.0)
MCHC: 32.5 g/dL (ref 30.0–36.0)
MCV: 97.8 fL (ref 78.0–100.0)
Platelets: 139 10*3/uL — ABNORMAL LOW (ref 150–400)
RBC: 2.71 MIL/uL — AB (ref 4.22–5.81)
RDW: 21.3 % — ABNORMAL HIGH (ref 11.5–15.5)
WBC: 7.1 10*3/uL (ref 4.0–10.5)

## 2017-04-16 LAB — PROTIME-INR
INR: 1.74
PROTHROMBIN TIME: 20.6 s — AB (ref 11.4–15.2)

## 2017-04-16 LAB — MAGNESIUM: Magnesium: 2.4 mg/dL (ref 1.7–2.4)

## 2017-04-16 LAB — HEPARIN LEVEL (UNFRACTIONATED): HEPARIN UNFRACTIONATED: 0.35 [IU]/mL (ref 0.30–0.70)

## 2017-04-16 MED ORDER — CALCIUM ACETATE (PHOS BINDER) 667 MG PO CAPS: 1334.0000 mg | ORAL_CAPSULE | Freq: Three times a day (TID) | ORAL | Status: DC

## 2017-04-16 MED ORDER — GUAIFENESIN ER 600 MG PO TB12
600.0000 mg | ORAL_TABLET | Freq: Two times a day (BID) | ORAL | Status: AC
  Administered 2017-04-16 – 2017-04-18 (×6): 600 mg via ORAL
  Filled 2017-04-16 (×7): qty 1

## 2017-04-16 MED ORDER — SODIUM CHLORIDE 0.9 % IV SOLN: 100.0000 mL | INTRAVENOUS | Status: DC | PRN

## 2017-04-16 MED ORDER — RENA-VITE PO TABS: 1.0000 | ORAL_TABLET | Freq: Every day | ORAL | Status: DC

## 2017-04-16 MED ORDER — MIDODRINE HCL 5 MG PO TABS
5.0000 mg | ORAL_TABLET | Freq: Once | ORAL | Status: AC
  Administered 2017-04-16: 5 mg via ORAL
  Filled 2017-04-16: qty 1

## 2017-04-16 MED ORDER — WARFARIN SODIUM 2.5 MG PO TABS
2.5000 mg | ORAL_TABLET | Freq: Once | ORAL | Status: AC
  Administered 2017-04-16: 2.5 mg via ORAL
  Filled 2017-04-16: qty 1

## 2017-04-16 MED ORDER — ALTEPLASE 2 MG IJ SOLR: 2.0000 mg | Freq: Once | INTRAMUSCULAR | Status: DC | PRN

## 2017-04-16 MED ORDER — HEPARIN SODIUM (PORCINE) 1000 UNIT/ML DIALYSIS: 1000.0000 [IU] | INTRAMUSCULAR | Status: DC | PRN

## 2017-04-16 MED ORDER — HEPARIN SODIUM (PORCINE) 1000 UNIT/ML DIALYSIS: 20.0000 [IU]/kg | INTRAMUSCULAR | Status: DC | PRN

## 2017-04-16 MED ORDER — LIDOCAINE HCL (PF) 1 % IJ SOLN
5.0000 mL | INTRAMUSCULAR | Status: DC | PRN
  Filled 2017-04-16: qty 5

## 2017-04-16 MED ORDER — LIDOCAINE-PRILOCAINE 2.5-2.5 % EX CREA: 1.0000 "application " | TOPICAL_CREAM | CUTANEOUS | Status: DC | PRN

## 2017-04-16 MED ORDER — PENTAFLUOROPROP-TETRAFLUOROETH EX AERO: 1.0000 "application " | INHALATION_SPRAY | CUTANEOUS | Status: DC | PRN

## 2017-04-16 NOTE — Progress Notes (Signed)
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 Cirrhosis by CT 7 Gen weakness Plan: HD again today for volume  Subjective: Interval History: Increased SOB, see CXR  Objective: Vital signs in last 24 hours: Temp:  [97.5 F (36.4 C)-98.4 F (36.9 C)] 97.5 F (36.4 C) (06/30 0537) Pulse Rate:  [55-100] 82 (06/30 0537) Resp:  [18] 18 (06/30 0537) BP: (68-124)/(18-92) 122/18 (06/30 0537) SpO2:  [96 %-99 %] 98 % (06/30 1430) Weight:  [106.2 kg (234 lb 2.1 oz)] 106.2 kg (234 lb 2.1 oz) (06/29 1811) Weight change: 1 kg (2 lb 3.3 oz)  Intake/Output from previous day: 06/29 0701 - 06/30 0700 In: 320 [P.O.:220; IV Piggyback:100] Out: 2000  Intake/Output this shift: No intake/output data recorded.  General appearance: alert and cooperative Resp: wheezes bilaterally Chest wall: no tenderness Cardio: regular rate and rhythm, S1, S2 normal, no murmur, click, rub or gallop and audible click Extremities: edema 1+  Lab Results:  Recent Labs  04/15/17 0525 04/16/17 0306  WBC 7.4 7.1  HGB 8.5* 8.6*  HCT 25.2* 26.5*  PLT 144* 139*   BMET:  Recent Labs  04/15/17 0525 04/16/17 0306  NA 134* 135  K 3.9 4.2  CL 93* 96*  CO2 25 25  GLUCOSE 101* 113*  BUN 73* 43*  CREATININE 8.23* 6.06*  CALCIUM 8.5* 8.6*   No results for input(s): PTH in the last 72 hours. Iron Studies: No results for input(s): IRON, TIBC, TRANSFERRIN, FERRITIN in the last 72 hours. Studies/Results: Dg Chest Port 1 View  Result Date: 04/16/2017 CLINICAL DATA:  76 year old male with shortness of breath EXAM: PORTABLE CHEST 1 VIEW COMPARISON:  Prior chest x-ray 04/10/2017; prior chest CT 04/11/2017 FINDINGS: Right IJ tunneled hemodialysis catheter. The catheter tip projects over the mid right atrium. Stable cardiomegaly.  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 Atherosclerotic calcifications again noted in the transverse aorta. There is a pulmonary vascular congestion bordering on mild edema. The previously noted loculated pleural fluid is no longer evident. No acute osseous abnormality. IMPRESSION: 1. Pulmonary vascular congestion with mild interstitial edema. 2. Bilateral layering pleural effusions. 3. Stable cardiomegaly. 4.  Aortic Atherosclerosis (ICD10-170.0) 5. Right IJ tunneled hemodialysis catheter in good position with the tip overlying the mid right atrium. Electronically Signed   By: Jacqulynn Cadet M.D.   On: 04/16/2017 10:53    Scheduled: . albuterol  2.5 mg Nebulization TID  . calcium acetate  1,334 mg Oral TID WC  . guaiFENesin  600 mg Oral BID  . levothyroxine  50 mcg Oral QAC breakfast  . montelukast  10 mg Oral QHS  . multivitamin  1 tablet Oral QHS  . pantoprazole  40 mg Oral Q0600  . warfarin  2.5 mg Oral ONCE-1800  . Warfarin - Pharmacist Dosing Inpatient   Does not apply q1800    LOS: 6 days   Robert Stanton C 04/16/2017,2:34 PM

## 2017-04-16 NOTE — Progress Notes (Signed)
Lynch, NP had a 9 beat run of VT.  Denies CP.  No new orders.  Will continue to monitor.

## 2017-04-16 NOTE — Progress Notes (Signed)
Pt. Placed on CPAP with 2 lpm oxygen added to circuit, tolerating well, RT to monitor.

## 2017-04-16 NOTE — Progress Notes (Signed)
PROGRESS NOTE    Robert Stanton  IEP:[id number] DOB: 1941-08-12 DOA: 04/10/2017 PCP: Unk Pinto, MD   Chief Complaint  Patient presents with  . Nausea  . Dizziness    Brief Narrative:  HPI on 04/10/2017 by Dr. Jenetta Downer Robert Stanton is a 76 y.o. male with medical history significant for- hypertension, coronary artery disease- CABG- 2010,  atrial fibrillation CKD5, hypertension, esophageal strictures, gout, COPD, OSA, mechanical aortic valve, hypothyroidism, peripheral artery disease, type II DM. Patient came in with complaints of bleeding from his rectum was started about 3 days ago, he also reports dark stools- 3-4 days. He denies abdominal pain. In the ED he had an episode of vomiting- with coffee-ground emesis.  He endorses dizziness that started Saturday morning- 04/09/17. This appears to be a chronic issue for patient. Pt denies NSAID use. Patient reports a history of peptic ulcer disease diagnosed with EGD- several years ago. Last EGD 10/2012- was esophageal stricture and erosions, erosive gastritis. Last colonoscopy was about 10 years ago- reports it was normal. He also notes a chronic cough that is productive today, though he has a history of COPD. His shortness of breath is at baseline. He denies fever or chills.  Interim history Patient was found to have heme positive stools, with increasing creatinine and elevated INR. He was admitted for evaluation of GI bleeding. Patient underwent EGD showing AVMs status post hemostatic endoscopy with APC. Nephrology also consulted and hemodialysis initiated.  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 Esophageal stricture without esophagitis. Moderately large hiatal hernia with [name] erosions. -Per gastroenterology, patient may resume  anticoagulation -Hemoglobin currently 8.5 (baseline hemoglobin 8-9) -Continue to monitor CBC -Will start on supplementation 325mg  BID -Per GI recommendations, Protonix indefinitely and iron supplementation   Acute renal failure on chronic kidney disease, stage IV with oliguria -Creatinine continued to worsen, peaked at 11.8 -Nephrology consult and appreciated, hemodialysis initiated -Patient was given IV diuresis however remained oliguric with fluid overload -Interventional radiology consulted and appreciated for HD catheter placement -Of note, patient did have aVF on left upper extremity 03/04/2017 -Placed on phoslo, renavit  Acute on Chronic systolic heart failure -Echocardiogram EF 35% with hypokinesis worse in the inferior and septal walls -Last echocardiogram 05/20/2016 showed an EF of 45%, basal to mid inferior severe hypokinesis. Indeterminate diastolic function -Patient was given diuresis per nephrology however patient continued to remain oliguric -Continue volume control with HD -Monitor intake and output, daily weights -Cardiology consulted and appreciated  Right loculated pleural effusion -No signs of an infection at this time -CT chest: Right-sided calcified pleural plaque, nonspecific with differential including asbestosis related pleural disease versus pleural insult such as trauma or infection. Small bilateral pleural effusions, probably loculated bilaterally with small loculated components. Right major fissure.  Atrial fibrillation/aortic valve replacement -Patient presented with supratherapeutic INR, given vitamin K as well as fresh frozen plasma -INR currently 1.74 -Restarted coumadin with conservative heparin bridge -hemoglobin currently stable  Dyspnea -Possibly secondary to volume overload -continue hemodialysis and supplement O2 as needed  Essential hypertension -BP currently stable, was hypotensive -Continue to monitor closely and start IV medications as  needed  Diabetes mellitus, type II -Hemoglobin A1c was 5.1 on 03/05/2017  Atherosclerotic thoracic aorta -4.3 cm ectatic ascending thoracic aorta noted on CT scan -Patient will need annual imaging by CTA or MRA  Possible cirrhosis/ elevated  AST -Noted on CT chest -AST improving slightly, currently 105 (per patient and son, no longer consumes alcohol) -Suspect elevated AST secondary to medications -Reviewed medications with pharmacy. One, Synthroid, Protonix, Lasix may all increased LFTs. CRP also increase AST. Lasix has been discontinued. Given that patient currently requires Protonix for GI bleed as well as heparin for bridging of Coumadin, will continue these medications and to monitor LFTs closely.  Mildly elevated troponin -Likely secondary to the above, currently chest pain-free -Troponin elevation not consistent with ACS -Echocardiogram as above  NSVT -Overnight, had 9beat run of VT -Magnesium 2.4 and potassium 4.2 -Continue to monitor   Hypothyroidism -Continue Synthroid  Nausea -possibly related to the start of hemodialysis -Currently denies further nausea, Zofran has been ordered however patient has not used.  DVT Prophylaxis  Coumadin/heparin  Code Status: Full  Family Communication: Son at bedside  Disposition Plan: Admitted. Dispo pending.  Consultants Nephrology Gastroenterology Interventional radiology   Procedures  EGD  Right IJ HD tunneled catheter placement  Antibiotics   Anti-infectives    Start     Dose/Rate Route Frequency Ordered Stop   04/12/17 1407  ceFAZolin (ANCEF) 2-4 GM/100ML-% IVPB    Comments:  Dhers, Patricia   : cabinet override      04/12/17 1407 04/13/17 0214   04/12/17 1100  ceFAZolin (ANCEF) IVPB 2g/100 mL premix     2 g 200 mL/hr over 30 Minutes Intravenous On call 04/12/17 1019 04/12/17 1443      Subjective:   Robert Stanton seen and examined today. Patient states his breathing is better than it was last night.  States he had shortness of breath. Currently denies chest pain, abdominal pain, nausea or vomiting, diarrhea, constipation. Is able to eat some.   Objective:   Vitals:   04/15/17 2119 04/15/17 2237 04/16/17 0537 04/16/17 0819  BP: (!) 108/50  (!) 122/18   Pulse: 100 98 82   Resp: 18 18 18    Temp: 98.4 F (36.9 C)  97.5 F (36.4 C)   TempSrc:   Oral   SpO2: 99%  97% 96%  Weight:      Height:        Intake/Output Summary (Last 24 hours) at 04/16/17 1041 Last data filed at 04/15/17 1811  Gross per 24 hour  Intake              260 ml  Output             2000 ml  Net            -1740 ml   Filed Weights   04/14/17 1820 04/15/17 1429 04/15/17 1811  Weight: 105 kg (231 lb 7.7 oz) 108 kg (238 lb 1.6 oz) 106.2 kg (234 lb 2.1 oz)   Exam  General: Well developed, chronically ill appearing, NAD  HEENT: NCAT, ucous membranes moist.   Cardiovascular: S1 S2 auscultated,+click, irregular  Respiratory: Clear to auscultation bilaterally with equal chest rise  Abdomen: Soft, nontender, nondistended, + bowel sounds  Extremities: warm dry without cyanosis clubbing. +LE edema B/L  Neuro: AAOx3, nonfocal  Psych: pleasant and appropriate  Data Reviewed: I have personally reviewed following labs and imaging studies  CBC:  Recent Labs Lab 04/12/17 1023 04/13/17 0716 04/14/17 0623 04/15/17 0525 04/16/17 0306  WBC 9.6 9.4 8.4 7.4 7.1  HGB 8.5* 8.5* 8.5* 8.5* 8.6*  HCT 26.1* 25.8* 25.8* 25.2* 26.5*  MCV 93.5 93.8 93.8 95.1 97.8  PLT 185 193 157 144* 139*   Basic  Metabolic Panel:  Recent Labs Lab 04/12/17 0154 04/13/17 0715 04/14/17 1751 04/15/17 0525 04/16/17 0306 04/16/17 0734  NA 132* 133* 131* 134* 135  --   K 4.0 4.3 4.3 3.9 4.2  --   CL 93* 92* 93* 93* 96*  --   CO2 21* 22 21* 25 25  --   GLUCOSE 129* 118* 107* 101* 113*  --   BUN 144* 155* 111* 73* 43*  --   CREATININE 10.95* 11.80* 10.21* 8.23* 6.06*  --   CALCIUM 8.4* 8.0* 8.3* 8.5* 8.6*  --   MG  --   --    --   --   --  2.4  PHOS 7.0* 8.2*  --   --   --   --    GFR: Estimated Creatinine Clearance: 12.6 mL/min (A) (by C-G formula based on SCr of 6.06 mg/dL (H)). Liver Function Tests:  Recent Labs Lab 04/10/17 2003 04/12/17 0154 04/13/17 0258 04/13/17 0715 04/14/17 5277 04/15/17 0525 04/16/17 0306  AST 69*  --   --   --  126* 105* 106*  ALT 26  --  51  --  39 28 25  ALKPHOS 69  --   --   --  63 63 73  BILITOT 1.8*  --   --   --  3.9* 4.3* 4.7*  PROT 7.3  --   --   --  7.3 7.7 7.7  ALBUMIN 2.8* 2.9*  --  2.9* 2.9* 3.0* 3.0*    Recent Labs Lab 04/10/17 2046  LIPASE 45   No results for input(s): AMMONIA in the last 168 hours. Coagulation Profile:  Recent Labs Lab 04/12/17 0154 04/13/17 0714 04/14/17 0623 04/15/17 0525 04/16/17 0306  INR 1.54 1.45 1.58 1.55 1.74   Cardiac Enzymes:  Recent Labs Lab 04/11/17 0216 04/11/17 0646 04/11/17 1425  TROPONINI 0.03* 0.03* 0.05*   BNP (last 3 results) No results for input(s): PROBNP in the last 8760 hours. HbA1C: No results for input(s): HGBA1C in the last 72 hours. CBG: No results for input(s): GLUCAP in the last 168 hours. Lipid Profile: No results for input(s): CHOL, HDL, LDLCALC, TRIG, CHOLHDL, LDLDIRECT in the last 72 hours. Thyroid Function Tests: No results for input(s): TSH, T4TOTAL, FREET4, T3FREE, THYROIDAB in the last 72 hours. Anemia Panel: No results for input(s): VITAMINB12, FOLATE, FERRITIN, TIBC, IRON, RETICCTPCT in the last 72 hours. Urine analysis:    Component Value Date/Time   COLORURINE YELLOW 09/26/2016 1939   APPEARANCEUR CLEAR 09/26/2016 1939   LABSPEC 1.005 09/26/2016 1939   PHURINE 5.0 09/26/2016 1939   GLUCOSEU NEGATIVE 09/26/2016 1939   HGBUR MODERATE (A) 09/26/2016 1939   BILIRUBINUR NEGATIVE 09/26/2016 1939   KETONESUR NEGATIVE 09/26/2016 1939   PROTEINUR NEGATIVE 09/26/2016 1939   NITRITE NEGATIVE 09/26/2016 1939   LEUKOCYTESUR NEGATIVE 09/26/2016 1939   Sepsis  Labs: @LABRCNTIP (procalcitonin:4,lacticidven:4)  )No results found for this or any previous visit (from the past 240 hour(s)).    Radiology Studies: No results found.   Scheduled Meds: . albuterol  2.5 mg Nebulization TID  . calcium acetate  1,334 mg Oral TID WC  . guaiFENesin  600 mg Oral BID  . levothyroxine  50 mcg Oral QAC breakfast  . montelukast  10 mg Oral QHS  . multivitamin  1 tablet Oral QHS  . pantoprazole  40 mg Oral Q0600  . Warfarin - Pharmacist Dosing Inpatient   Does not apply q1800   Continuous Infusions: . sodium chloride    .  sodium chloride    . ferric gluconate (FERRLECIT/NULECIT) IV Stopped (04/15/17 1226)  . heparin 1,250 Units/hr (04/16/17 0455)     LOS: 6 days   Time Spent in minutes   40 minutes  Leina Babe D.O. on 04/16/2017 at 10:41 AM  Between 7am to 7pm - Pager - [phone]  After 7pm go to [email or web address] - password [email or web address]  And look for the night coverage person covering for me after hours  [facility] Office  [phone]

## 2017-04-16 NOTE — Significant Event (Signed)
Rapid Response Event Note  Overview: Called for assistance with obtaining BP Time Called: 1700 Arrival Time: 1704 Event Type: Other (Comment)  Initial Focused Assessment:  Called by RN for assistance with patient, who Rn is unable to get a BP measurement on.  On my arrival to patients room, RN at bedside.  Patient is sitting up in bed, alert and talking with [place].  Denies SOB or CP.     Interventions:  I was unable to get a manual pressure due to inability to hear distinct sounds.  Used doppler, SBP 124.  Spoke with Dr. Ree Kida, gave a 1 x dose of midodrine.    Plan of Care (if not transferred): RN to monitor and call if assistance needed  Event Summary:   at      at          [facility]

## 2017-04-16 NOTE — Progress Notes (Addendum)
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 We consulted for acute CHF - EF decreased to 35% from 45% previously. He is a patient of mine with known CAD, PAD,CABG, AAA, ESRD (started on HD this admission) and mechanical AVR. He currently reports his breathing has improved. Negative 1.7L yesterday- coumadin restarted with heparin bridge.  H/H stable for 4 days at 8.6/26.5. INR today is 1.74. Does have an appetite.  Objective   Vitals:   04/15/17 2119 04/15/17 2237 04/16/17 0537 04/16/17 0819  BP: (!) 108/50  (!) 122/18   Pulse: 100 98 82   Resp: _0 Temp: 98.4 F (36.9 C)  97.5 F (36.4 C)   TempSrc:   Oral   SpO2: 99%  97% 96%  Weight:      Height:        Intake/Output Summary (Last 24 hours) at 04/16/17 1026 Last data filed at 04/15/17 1811  Gross per 24 hour  Intake              260 ml  Output             2000 ml  Net            -1740 ml   Filed Weights   04/14/17 1820 04/15/17 1429 04/15/17 1811  Weight: 231 lb 7.7 oz (105 kg) 238 lb 1.6 oz (108  kg) 234 lb 2.1 oz (106.2 kg)    Physical Exam   General appearance: alert, no distress and sitting up eating Neck: JVD - 3 cm above sternal notch and no carotid bruit Lungs: diminished breath sounds bilaterally Heart: irregularly irregular rhythm Abdomen: soft, non-tender; bowel sounds normal; no masses,  no organomegaly and no guarding Extremities: trace edema, temporary dialysis cath in right upper chest Pulses: 2+ and symmetric Skin: Skin color, texture, turgor normal. No rashes or lesions Neurologic: Mental status: Alert, oriented, thought content appropriate Psych: Pleasant  Inpatient Medications    Scheduled Meds: . albuterol  2.5 mg Nebulization TID  . calcium acetate  1,334 mg Oral TID WC  . guaiFENesin  600 mg Oral BID  . levothyroxine  50 mcg Oral QAC breakfast  . montelukast  10 mg Oral QHS  . multivitamin  1 tablet Oral QHS  . pantoprazole  40 mg Oral Q0600  . Warfarin - Pharmacist Dosing Inpatient   Does not apply q1800    Continuous Infusions: . sodium chloride    . sodium chloride    .  ferric gluconate (FERRLECIT/NULECIT) IV Stopped (04/15/17 1226)  . heparin 1,250 Units/hr (04/16/17 0455)    PRN Meds: sodium chloride, sodium chloride, alteplase, heparin, heparin, ipratropium-albuterol, lidocaine (PF), lidocaine-prilocaine, ondansetron (ZOFRAN) IV, pentafluoroprop-tetrafluoroeth   Labs   Results for orders placed or performed during the hospital encounter of 04/10/17 (from the past 48 hour(s))  Protime-INR     Status: Abnormal   Collection Time: 04/15/17  5:25 AM  Result Value Ref Range   Prothrombin Time 18.8 (H) 11.4 - 15.2 seconds   INR 1.55   CBC     Status: Abnormal   Collection Time: 04/15/17  5:25 AM  Result Value Ref Range   WBC 7.4 4.0 - 10.5 K/uL   RBC 2.65 (L) 4.22 - 5.81 MIL/uL   Hemoglobin 8.5 (L) 13.0 - 17.0 g/dL   HCT 25.2 (L) 39.0 - 52.0 %   MCV 95.1 78.0 - 100.0 fL   MCH 32.1 26.0 - 34.0 pg   MCHC 33.7 30.0 - 36.0 g/dL   RDW  19.9 (H) 11.5 - 15.5 %   Platelets 144 (L) 150 - 400 K/uL  Comprehensive metabolic panel     Status: Abnormal   Collection Time: 04/15/17  5:25 AM  Result Value Ref Range   Sodium 134 (L) 135 - 145 mmol/L   Potassium 3.9 3.5 - 5.1 mmol/L   Chloride 93 (L) 101 - 111 mmol/L   CO2 25 22 - 32 mmol/L   Glucose, Bld 101 (H) 65 - 99 mg/dL   BUN 73 (H) 6 - 20 mg/dL   Creatinine, Ser 8.23 (H) 0.61 - 1.24 mg/dL   Calcium 8.5 (L) 8.9 - 10.3 mg/dL   Total Protein 7.7 6.5 - 8.1 g/dL   Albumin 3.0 (L) 3.5 - 5.0 g/dL   AST 105 (H) 15 - 41 U/L   ALT 28 17 - 63 U/L   Alkaline Phosphatase 63 38 - 126 U/L   Total Bilirubin 4.3 (H) 0.3 - 1.2 mg/dL   GFR calc non Af Amer 6 (L) >60 mL/min   GFR calc Af Amer 7 (L) >60 mL/min    Comment: (NOTE) The eGFR has been calculated using the CKD EPI equation. This calculation has not been validated in all clinical situations. eGFR's persistently <60 mL/min signify possible Chronic Kidney Disease.    Anion gap 16 (H) 5 - 15  Heparin level (unfractionated)     Status: Abnormal   Collection Time: 04/15/17 12:07 PM  Result Value Ref Range   Heparin Unfractionated 0.28 (L) 0.30 - 0.70 IU/mL    Comment:        IF HEPARIN RESULTS ARE BELOW EXPECTED VALUES, AND PATIENT DOSAGE HAS BEEN CONFIRMED, SUGGEST FOLLOW UP TESTING OF ANTITHROMBIN III LEVELS.   CBC     Status: Abnormal   Collection Time: 04/16/17  3:06 AM  Result Value Ref Range   WBC 7.1 4.0 - 10.5 K/uL   RBC 2.71 (L) 4.22 - 5.81 MIL/uL   Hemoglobin 8.6 (L) 13.0 - 17.0 g/dL   HCT 26.5 (L) 39.0 - 52.0 %   MCV 97.8 78.0 - 100.0 fL   MCH 31.7 26.0 - 34.0 pg   MCHC 32.5 30.0 - 36.0 g/dL   RDW 21.3 (H) 11.5 - 15.5 %   Platelets 139 (L) 150 - 400 K/uL  Heparin level (unfractionated)     Status: None   Collection Time: 04/16/17  3:06 AM  Result Value Ref Range   Heparin Unfractionated 0.35 0.30 - 0.70 IU/mL  Comment:        IF HEPARIN RESULTS ARE BELOW EXPECTED VALUES, AND PATIENT DOSAGE HAS BEEN  CONFIRMED, SUGGEST FOLLOW UP TESTING OF ANTITHROMBIN III LEVELS.   Comprehensive metabolic panel     Status: Abnormal   Collection Time: 04/16/17  3:06 AM  Result Value Ref Range   Sodium 135 135 - 145 mmol/L   Potassium 4.2 3.5 - 5.1 mmol/L   Chloride 96 (L) 101 - 111 mmol/L   CO2 25 22 - 32 mmol/L   Glucose, Bld 113 (H) 65 - 99 mg/dL   BUN 43 (H) 6 - 20 mg/dL   Creatinine, Ser 6.06 (H) 0.61 - 1.24 mg/dL   Calcium 8.6 (L) 8.9 - 10.3 mg/dL   Total Protein 7.7 6.5 - 8.1 g/dL   Albumin 3.0 (L) 3.5 - 5.0 g/dL   AST 106 (H) 15 - 41 U/L   ALT 25 17 - 63 U/L   Alkaline Phosphatase 73 38 - 126 U/L   Total Bilirubin 4.7 (H) 0.3 - 1.2 mg/dL   GFR calc non Af Amer 8 (L) >60 mL/min   GFR calc Af Amer 9 (L) >60 mL/min    Comment: (NOTE) The eGFR has been calculated using the CKD EPI equation. This calculation has not been validated in all clinical situations. eGFR's persistently <60 mL/min signify possible Chronic Kidney Disease.    Anion gap 14 5 - 15  Protime-INR     Status: Abnormal   Collection Time: 04/16/17  3:06 AM  Result Value Ref Range   Prothrombin Time 20.6 (H) 11.4 - 15.2 seconds   INR 1.74   Magnesium     Status: None   Collection Time: 04/16/17  7:34 AM  Result Value Ref Range   Magnesium 2.4 1.7 - 2.4 mg/dL    ECG   (6/25) a-fib with CVR, LBBB - Personally Reviewed  Telemetry   A-fib - Personally Reviewed  Radiology    No results found.  Cardiac Studies   Indications:      CHF - 428.0.  ------------------------------------------------------------------- History:   PMH:  [employer] AVR. Ischemic Cardiomyopathy. Chronic Kidney Disease. Abdominal Aortic Aneurysm.  Murmur.  Coronary artery disease.  Chronic obstructive pulmonary disease.  Risk factors:  Hypertension. Dyslipidemia.  ------------------------------------------------------------------- Study Conclusions  - Left ventricle: Poor acoiustic windows limit study, even with   Definity. LVEF is  approximately 35%with hypokinesis worse inf the   inferior and septal walls. The cavity size was severely dilated.   Wall thickness was normal. - Aortic valve: Poor acoustic windows limit study AV prosthesis is   difficult to see Peak and mean gradients through the valve are 35   and 18 mm Hg respectively. There is trace perivalvular AI. There   was mild regurgitation. Valve area (Vmean): 0.62 cm^2. - Mitral valve: Calcified annulus. Mildly thickened leaflets .   There was mild regurgitation. - Left atrium: The atrium was moderately dilated. - Right ventricle: Systolic function was mildly reduced. - Right atrium: The atrium was severely dilated. - Pulmonary arteries: PA peak pressure: 37 mm Hg (S). - Pericardium, extracardiac: A trivial pericardial effusion was   identified.  Assessment   1. Principal Problem: 2.   GI bleed 3. Active Problems: 4.   Essential hypertension 5.   Coronary atherosclerosis 6.   ESOPHAGEAL STRICTURE 7.   PAD,s/p aorto bifem BG, s/p Rt Fem PTA 3/15 8.   Gout 9.   OSA and COPD overlap syndrome ([place]) 10.   AAA (abdominal  aortic aneurysm) ([place]) 11.   Permanent atrial fibrillation ([place]) 12.   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   Long term current use of anticoagulant therapy 14.   Status post mechanical aortic valve replacement 15.   Generalized anxiety disorder 16.   Hypothyroidism 17.   Dizziness 18.   Chronic kidney disease, stage V ([place]) 19.   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   Acute gastric erosion 21.   Esophageal stricture 22.   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   Plan   1. Mr. Hinch has had a new decline in LVEF to 35%. He feels like he is dying, but breathing is better today - his appetite appears good. He has had volume removal with HD. H/H stable. Amiodarone held - remains in a-fib - restarted warfarin for mechanical AVR and on heparin bridge. Goal INR 2.5-3.0  (valve + a-fib). BP low - suspect he will not tolerate the addition of hydralazine/nitrates at this time with acute dialysis - will see how BP is when more volume removed.   Time Spent Directly with Patient:  I have spent a total of 25 minutes with the patient reviewing hospital notes, telemetry, EKGs, labs and examining the patient as well as establishing an assessment and plan that was discussed personally with the patient. > 50% of time was spent in direct patient care.  Length of Stay:  LOS: 6 days   Pixie Casino, MD, [place]  Attending Cardiologist  Direct Dial: [phone]  Fax: [phone]  Website:  [email or web address]  [name]  04/16/2017, 10:26 AM

## 2017-04-16 NOTE — Progress Notes (Signed)
ANTICOAGULATION CONSULT NOTE - Follow Up Consult  Pharmacy Consult for Heparin and Coumadin Indication: atrial fibrillation and mechanical atrial valve  Allergies  Allergen Reactions  . Chantix [Varenicline] Nausea And Vomiting  . Lyrica [Pregabalin] Other (See Comments)    Swelling, couldn't breathe  . Nsaids Other (See Comments)    Cannot take while on coumadin  . Simvastatin Other (See Comments)    Pt said he had a "bleed out" after taking it  . Ziac [Bisoprolol-Hydrochlorothiazide] Other (See Comments)    fatigue    Patient Measurements: Height: 5\' 8"  (172.7 cm) Weight: 234 lb 2.1 oz (106.2 kg) IBW/kg (Calculated) : 68.4 Heparin Dosing Weight: 93 kg  Vital Signs: Temp: 97.5 F (36.4 C) (06/30 0537) Temp Source: Oral (06/30 0537) BP: 122/18 (06/30 0537) Pulse Rate: 82 (06/30 0537)  Labs:  Recent Labs  04/14/17 6010 04/15/17 0525 04/15/17 1207 04/16/17 0306  HGB 8.5* 8.5*  --  8.6*  HCT 25.8* 25.2*  --  26.5*  PLT 157 144*  --  139*  LABPROT 19.0* 18.8*  --  20.6*  INR 1.58 1.55  --  1.74  HEPARINUNFRC 0.31  --  0.28* 0.35  CREATININE 10.21* 8.23*  --  6.06*    Estimated Creatinine Clearance: 12.6 mL/min (A) (by C-G formula based on SCr of 6.06 mg/dL (H)).  Assessment:  76 yo M admitted 6/24 with rectal bleeding.  Pt was on Coumadin PTA for hx mechanical AVR and afib.  Coumadin was held and reversed with Vitamin K and FFP in the setting of acute GIB and supratherapeutic INR. Bleeding gastric AVM successfully treated. Noted large hiatal hernia with [name] erosions. GI notes expect chronic low-grade oozing over time. On Protonix 40 mg daily, which is to continue indefinitely, as well as oral iron BID.  Currently off oral iron -> day #3 of 5 IV iron.  Off ASA 81 mg. Also off Amiodarone, which may also effect Coumadin requirements.    Anticoagulation with heparin >> Coumadin was restarted 6/27.   Heparin level is at low therapeutic goal (0.35) on 1250 units/hr.  CBC low stable.  INR up to 1.74 after Coumadin 2.5 mg x 3 days.  No bleeding noted.  Cardiology notes target INR 2.5-3.0.  Going slowly with Coumadin.  T bili trended up to 4.7,  AST 106, same. Off Amiodarone since admitted.    Most recent Coumadin regimen:  2.5 mg daily except none on Mondays and Thursdays.  Last outpatient INR was 3.3 on 04/05/17 on 2.5 mg daily. Regimen adjusted that day to skip Coumadin 2 days a week. Confirmed with Dr. Idell Pickles office.     Goal of Therapy: INR 2-3 (prefer 2.5-3.0 per cardiology) Heparin level 0.3-0.5 units/ml (with recent bleed) Monitor platelets by anticoagulation protocol: Yes   Plan:   Continue heparin drip at 1250 units/hr.  Coumadin 2.5 mg again today.  Daily heparin level, PT/INR and CBC.  Monitor for any bleeding.   Arty Baumgartner, [place] Pager: [phone] 04/16/2017,12:24 PM

## 2017-04-17 DIAGNOSIS — K921 Melena: Secondary | ICD-10-CM

## 2017-04-17 LAB — COMPREHENSIVE METABOLIC PANEL
ALBUMIN: 3 g/dL — AB (ref 3.5–5.0)
ALT: 24 U/L (ref 17–63)
ANION GAP: 15 (ref 5–15)
AST: 99 U/L — AB (ref 15–41)
Alkaline Phosphatase: 67 U/L (ref 38–126)
BILIRUBIN TOTAL: 5 mg/dL — AB (ref 0.3–1.2)
BUN: 68 mg/dL — AB (ref 6–20)
CO2: 25 mmol/L (ref 22–32)
Calcium: 9 mg/dL (ref 8.9–10.3)
Chloride: 95 mmol/L — ABNORMAL LOW (ref 101–111)
Creatinine, Ser: 8.22 mg/dL — ABNORMAL HIGH (ref 0.61–1.24)
GFR, EST AFRICAN AMERICAN: 7 mL/min — AB (ref >60)
GFR, EST NON AFRICAN AMERICAN: 6 mL/min — AB (ref >60)
Glucose, Bld: 115 mg/dL — ABNORMAL HIGH (ref 65–99)
POTASSIUM: 4.9 mmol/L (ref 3.5–5.1)
SODIUM: 135 mmol/L (ref 135–145)
TOTAL PROTEIN: 7.7 g/dL (ref 6.5–8.1)

## 2017-04-17 LAB — CBC
HCT: 26.7 % — ABNORMAL LOW (ref 39.0–52.0)
HEMATOCRIT: 26.7 % — AB (ref 39.0–52.0)
HEMATOCRIT: 26.3 % — AB (ref 39.0–52.0)
HEMOGLOBIN: 8.8 g/dL — AB (ref 13.0–17.0)
HEMOGLOBIN: 8.6 g/dL — AB (ref 13.0–17.0)
HEMOGLOBIN: 8.8 g/dL — AB (ref 13.0–17.0)
MCH: 32.5 pg (ref 26.0–34.0)
MCH: 31.6 pg (ref 26.0–34.0)
MCH: 32.7 pg (ref 26.0–34.0)
MCHC: 33 g/dL (ref 30.0–36.0)
MCHC: 32.2 g/dL (ref 30.0–36.0)
MCHC: 33.5 g/dL (ref 30.0–36.0)
MCV: 98.5 fL (ref 78.0–100.0)
MCV: 98.2 fL (ref 78.0–100.0)
MCV: 97.8 fL (ref 78.0–100.0)
Platelets: 122 10*3/uL — ABNORMAL LOW (ref 150–400)
Platelets: 128 10*3/uL — ABNORMAL LOW (ref 150–400)
Platelets: 116 10*3/uL — ABNORMAL LOW (ref 150–400)
RBC: 2.71 MIL/uL — AB (ref 4.22–5.81)
RBC: 2.72 MIL/uL — AB (ref 4.22–5.81)
RBC: 2.69 MIL/uL — ABNORMAL LOW (ref 4.22–5.81)
RDW: 22.6 % — ABNORMAL HIGH (ref 11.5–15.5)
RDW: 23.1 % — ABNORMAL HIGH (ref 11.5–15.5)
RDW: 23.3 % — AB (ref 11.5–15.5)
WBC: 8.2 10*3/uL (ref 4.0–10.5)
WBC: 8.3 10*3/uL (ref 4.0–10.5)
WBC: 8.4 10*3/uL (ref 4.0–10.5)

## 2017-04-17 LAB — HEPARIN LEVEL (UNFRACTIONATED): HEPARIN UNFRACTIONATED: 0.26 [IU]/mL — AB (ref 0.30–0.70)

## 2017-04-17 LAB — PROTIME-INR
INR: 1.89
Prothrombin Time: 21.9 seconds — ABNORMAL HIGH (ref 11.4–15.2)

## 2017-04-17 LAB — MRSA PCR SCREENING: MRSA BY PCR: NEGATIVE

## 2017-04-17 MED ORDER — ALBUMIN HUMAN 25 % IV SOLN
25.0000 g | Freq: Once | INTRAVENOUS | Status: AC
  Administered 2017-04-17: 25 g via INTRAVENOUS

## 2017-04-17 MED ORDER — PANTOPRAZOLE SODIUM 40 MG IV SOLR
40.0000 mg | Freq: Two times a day (BID) | INTRAVENOUS | Status: DC
  Administered 2017-04-17 – 2017-04-20 (×8): 40 mg via INTRAVENOUS
  Filled 2017-04-17 (×8): qty 40

## 2017-04-17 MED ORDER — ALBUMIN HUMAN 25 % IV SOLN
INTRAVENOUS | Status: AC
  Administered 2017-04-17: 25 g via INTRAVENOUS
  Filled 2017-04-17: qty 100

## 2017-04-17 MED ORDER — MIDODRINE HCL 5 MG PO TABS
ORAL_TABLET | ORAL | Status: AC
  Administered 2017-04-17: 5 mg via ORAL
  Filled 2017-04-17: qty 1

## 2017-04-17 MED ORDER — MIDODRINE HCL 5 MG PO TABS
5.0000 mg | ORAL_TABLET | Freq: Two times a day (BID) | ORAL | Status: DC
  Administered 2017-04-17 – 2017-04-21 (×8): 5 mg via ORAL
  Filled 2017-04-17 (×7): qty 1

## 2017-04-17 MED ORDER — MIDODRINE HCL 5 MG PO TABS
5.0000 mg | ORAL_TABLET | Freq: Two times a day (BID) | ORAL | Status: DC
  Administered 2017-04-17: 5 mg via ORAL

## 2017-04-17 NOTE — Progress Notes (Signed)
PROGRESS NOTE    Robert Stanton  DGU:[id number] DOB: 1941/03/31 DOA: 04/10/2017 PCP: Unk Pinto, MD   Chief Complaint  Patient presents with  . Nausea  . Dizziness    Brief Narrative:  HPI on 04/10/2017 by Dr. Jenetta Downer Robert Stanton is a 76 y.o. male with medical history significant for- hypertension, coronary artery disease- CABG- 2010,  atrial fibrillation CKD5, hypertension, esophageal strictures, gout, COPD, OSA, mechanical aortic valve, hypothyroidism, peripheral artery disease, type II DM. Patient came in with complaints of bleeding from his rectum was started about 3 days ago, he also reports dark stools- 3-4 days. He denies abdominal pain. In the ED he had an episode of vomiting- with coffee-ground emesis.  He endorses dizziness that started Saturday morning- 04/09/17. This appears to be a chronic issue for patient. Pt denies NSAID use. Patient reports a history of peptic ulcer disease diagnosed with EGD- several years ago. Last EGD 10/2012- was esophageal stricture and erosions, erosive gastritis. Last colonoscopy was about 10 years ago- reports it was normal. He also notes a chronic cough that is productive today, though he has a history of COPD. His shortness of breath is at baseline. He denies fever or chills.  Interim history Patient was found to have heme positive stools, with increasing creatinine and elevated INR. He was admitted for evaluation of GI bleeding. Patient underwent EGD showing AVMs status post hemostatic endoscopy with APC. Nephrology also consulted and hemodialysis initiated.  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 Esophageal stricture without esophagitis. Moderately large hiatal hernia with [name] erosions. -Per gastroenterology, patient may resume  anticoagulation -Hemoglobin currently 8.8 (baseline hemoglobin 8-9) -Continue to monitor CBC -Continue iron supplementation 325mg  BID -Overnight, patient supposedly had several bloody bowel movements, however hemoglobin currently 8.8 (higher than the past few days) -GI consulted and appreciated, will reevaluate- possible repeat EGD 04/18/2017 -Patient was placed on conservative heparin bridge with Coumadin. Will await further recommendations from GI regarding anticoagulation. INR still subtherapeutic, INR 1.89 -Placed on IV protonix  Acute renal failure on chronic kidney disease, stage IV with oliguria -Creatinine continued to worsen, peaked at 11.8 -Nephrology consult and appreciated, hemodialysis initiated -Patient was given IV diuresis however remained oliguric with fluid overload -Interventional radiology consulted and appreciated for HD catheter placement -Of note, patient did have aVF on left upper extremity 03/04/2017 -Placed on phoslo, renavit -Patient still volume overloaded and was supposed to dialyze on 04/16/2017 however this was held as patient had questionable hypotension. Manual blood pressure remained stable and normal. -Has started patient on Midodrine and discussed with nephrology -Will await further recommendations   Acute on Chronic systolic heart failure -Echocardiogram EF 35% with hypokinesis worse in the inferior and septal walls -Last echocardiogram 05/20/2016 showed an EF of 45%, basal to mid inferior severe hypokinesis. Indeterminate diastolic function -Patient was given diuresis per nephrology however patient continued to remain oliguric -Continue volume control with HD -Monitor intake and output, daily weights -Cardiology consulted and appreciated  Right loculated pleural effusion -No signs of an infection at this time -CT chest: Right-sided calcified pleural plaque, nonspecific with differential including asbestosis related pleural disease versus pleural  insult such as trauma or infection. Small bilateral pleural effusions, probably loculated bilaterally with small loculated components. Right major fissure.  Atrial fibrillation/aortic valve replacement -Patient presented with supratherapeutic INR, given vitamin K as well as  fresh frozen plasma -INR currently 1.89 -Restarted coumadin with conservative heparin bridge- however currently held as patient began to have recurrent GI bleeding overnight -hemoglobin currently stable  Dyspnea -Possibly secondary to volume overload -continue hemodialysis and supplement O2 as needed  Essential hypertension complicated by ?hypotension -Manual BP stable -Will start on midodrine 5mg  BID -Will continue to closely monitor  Diabetes mellitus, type II -Hemoglobin A1c was 5.1 on 03/05/2017  Atherosclerotic thoracic aorta -4.3 cm ectatic ascending thoracic aorta noted on CT scan -Patient will need annual imaging by CTA or MRA  Possible cirrhosis/ elevated AST -Noted on CT chest -AST improving slightly, currently 105 (per patient and son, no longer consumes alcohol) -Suspect elevated AST secondary to medications -Reviewed medications with pharmacy. One, Synthroid, Protonix, Lasix may all increased LFTs. CRP also increase AST. Lasix has been discontinued. Given that patient currently requires Protonix for GI bleed as well as heparin for bridging of Coumadin, will continue these medications and to monitor LFTs closely.  Mildly elevated troponin -Likely secondary to the above, currently chest pain-free -Troponin elevation not consistent with ACS -Echocardiogram as above  NSVT -Overnight 04/15/17, had 9beat run of VT -Magnesium 2.4 and potassium 4.2 -Continue to monitor   Hypothyroidism -Continue Synthroid  Nausea -possibly related to the start of hemodialysis -Currently denies further nausea, Zofran has been ordered however patient has not used.  Goals of care/Code status -Discussed code status  with patient, he is not sure if he wants CPR. Will consult palliative care as patient is at high risk for decompensation given acute CHF exac with new start of HD and recurrent GI bleeding.  -Will transfer patient to stepdown for closer monitoring   DVT Prophylaxis  Coumadin/heparin --> SCDs  Code Status: Full  Family Communication: None at bedside.  Disposition Plan: Admitted. Transfer to stepdown.   Consultants Nephrology Gastroenterology Interventional radiology  Cardiology Palliative care  Procedures  EGD  Right IJ HD tunneled catheter placement  Antibiotics   Anti-infectives    Start     Dose/Rate Route Frequency Ordered Stop   04/12/17 1407  ceFAZolin (ANCEF) 2-4 GM/100ML-% IVPB    Comments:  Dhers, Patricia   : cabinet override      04/12/17 1407 04/13/17 0214   04/12/17 1100  ceFAZolin (ANCEF) IVPB 2g/100 mL premix     2 g 200 mL/hr over 30 Minutes Intravenous On call 04/12/17 1019 04/12/17 1443      Subjective:   Robert Stanton seen and examined today. Patient feels his breathing is fine at the moment. Denies current chest pain, abdominal pain. Does not know if he had bloody or dark bowel movements overnight. Denies current nausea. Denies dizziness or headache, palpitations.    Objective:   Vitals:   04/16/17 2229 04/16/17 2337 04/17/17 0550 04/17/17 0841  BP: (!) 103/51  (!) 107/31   Pulse: 78  77   Resp:   (!) 21   Temp:  97.8 F (36.6 C) 97.9 F (36.6 C)   TempSrc:  Oral Axillary   SpO2:    96%  Weight:      Height:        Intake/Output Summary (Last 24 hours) at 04/17/17 1241 Last data filed at 04/17/17 0906  Gross per 24 hour  Intake              118 ml  Output                0 ml  Net  118 ml   Filed Weights   04/14/17 1820 04/15/17 1429 04/15/17 1811  Weight: 105 kg (231 lb 7.7 oz) 108 kg (238 lb 1.6 oz) 106.2 kg (234 lb 2.1 oz)   Exam  General: Well developed, chronically ill appearing, NAD  HEENT: NCAT, mucous  membranes moist.   Cardiovascular: S1 S2 auscultated, +click, RRR  Respiratory: Clear to auscultation bilaterally with equal chest rise  Abdomen: Soft, nontender, nondistended, + bowel sounds  Extremities: warm dry without cyanosis clubbing. +LE edema B/L   Neuro: AAOx3, nonfocal  Psych: Flat, appropriate  Data Reviewed: I have personally reviewed following labs and imaging studies  CBC:  Recent Labs Lab 04/13/17 0716 04/14/17 0623 04/15/17 0525 04/16/17 0306 04/17/17 0557  WBC 9.4 8.4 7.4 7.1 8.2  HGB 8.5* 8.5* 8.5* 8.6* 8.8*  HCT 25.8* 25.8* 25.2* 26.5* 26.7*  MCV 93.8 93.8 95.1 97.8 98.5  PLT 193 157 144* 139* 527*   Basic Metabolic Panel:  Recent Labs Lab 04/12/17 0154 04/13/17 0715 04/14/17 0623 04/15/17 0525 04/16/17 0306 04/16/17 0734  NA 132* 133* 131* 134* 135  --   K 4.0 4.3 4.3 3.9 4.2  --   CL 93* 92* 93* 93* 96*  --   CO2 21* 22 21* 25 25  --   GLUCOSE 129* 118* 107* 101* 113*  --   BUN 144* 155* 111* 73* 43*  --   CREATININE 10.95* 11.80* 10.21* 8.23* 6.06*  --   CALCIUM 8.4* 8.0* 8.3* 8.5* 8.6*  --   MG  --   --   --   --   --  2.4  PHOS 7.0* 8.2*  --   --   --   --    GFR: Estimated Creatinine Clearance: 12.6 mL/min (A) (by C-G formula based on SCr of 6.06 mg/dL (H)). Liver Function Tests:  Recent Labs Lab 04/10/17 2003 04/12/17 0154 04/13/17 7824 04/13/17 0715 04/14/17 2353 04/15/17 0525 04/16/17 0306  AST 69*  --   --   --  126* 105* 106*  ALT 26  --  51  --  39 28 25  ALKPHOS 69  --   --   --  63 63 73  BILITOT 1.8*  --   --   --  3.9* 4.3* 4.7*  PROT 7.3  --   --   --  7.3 7.7 7.7  ALBUMIN 2.8* 2.9*  --  2.9* 2.9* 3.0* 3.0*    Recent Labs Lab 04/10/17 2046  LIPASE 45   No results for input(s): AMMONIA in the last 168 hours. Coagulation Profile:  Recent Labs Lab 04/13/17 0714 04/14/17 0623 04/15/17 0525 04/16/17 0306 04/17/17 0557  INR 1.45 1.58 1.55 1.74 1.89   Cardiac Enzymes:  Recent Labs Lab  04/11/17 0216 04/11/17 0646 04/11/17 1425  TROPONINI 0.03* 0.03* 0.05*   BNP (last 3 results) No results for input(s): PROBNP in the last 8760 hours. HbA1C: No results for input(s): HGBA1C in the last 72 hours. CBG: No results for input(s): GLUCAP in the last 168 hours. Lipid Profile: No results for input(s): CHOL, HDL, LDLCALC, TRIG, CHOLHDL, LDLDIRECT in the last 72 hours. Thyroid Function Tests: No results for input(s): TSH, T4TOTAL, FREET4, T3FREE, THYROIDAB in the last 72 hours. Anemia Panel: No results for input(s): VITAMINB12, FOLATE, FERRITIN, TIBC, IRON, RETICCTPCT in the last 72 hours. Urine analysis:    Component Value Date/Time   COLORURINE YELLOW 09/26/2016 1939   APPEARANCEUR CLEAR 09/26/2016 1939   LABSPEC 1.005 09/26/2016 1939  PHURINE 5.0 09/26/2016 1939   GLUCOSEU NEGATIVE 09/26/2016 1939   HGBUR MODERATE (A) 09/26/2016 1939   BILIRUBINUR NEGATIVE 09/26/2016 1939   KETONESUR NEGATIVE 09/26/2016 1939   PROTEINUR NEGATIVE 09/26/2016 1939   NITRITE NEGATIVE 09/26/2016 1939   LEUKOCYTESUR NEGATIVE 09/26/2016 1939   Sepsis Labs: @LABRCNTIP (procalcitonin:4,lacticidven:4)  )No results found for this or any previous visit (from the past 240 hour(s)).    Radiology Studies: Dg Chest Port 1 View  Result Date: 04/16/2017 CLINICAL DATA:  76 year old male with shortness of breath EXAM: PORTABLE CHEST 1 VIEW COMPARISON:  Prior chest x-ray 04/10/2017; prior chest CT 04/11/2017 FINDINGS: Right IJ tunneled hemodialysis catheter. The catheter tip projects over the mid right atrium. Stable cardiomegaly. 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 Atherosclerotic calcifications again noted in the transverse aorta. There is a pulmonary vascular congestion bordering on mild edema. The previously noted loculated pleural fluid is no longer evident. No acute osseous abnormality. IMPRESSION: 1. Pulmonary vascular congestion with mild interstitial  edema. 2. Bilateral layering pleural effusions. 3. Stable cardiomegaly. 4.  Aortic Atherosclerosis (ICD10-170.0) 5. Right IJ tunneled hemodialysis catheter in good position with the tip overlying the mid right atrium. Electronically Signed   By: Jacqulynn Cadet M.D.   On: 04/16/2017 10:53     Scheduled Meds: . albuterol  2.5 mg Nebulization TID  . calcium acetate  1,334 mg Oral TID WC  . guaiFENesin  600 mg Oral BID  . levothyroxine  50 mcg Oral QAC breakfast  . midodrine  5 mg Oral BID WC  . montelukast  10 mg Oral QHS  . multivitamin  1 tablet Oral QHS  . pantoprazole (PROTONIX) IV  40 mg Intravenous Q12H   Continuous Infusions: . sodium chloride    . sodium chloride    . sodium chloride    . sodium chloride    . ferric gluconate (FERRLECIT/NULECIT) IV Stopped (04/16/17 1756)  . heparin Stopped (04/17/17 0610)     LOS: 7 days   Time Spent in minutes   40 minutes  Lucian Baswell D.O. on 04/17/2017 at 12:41 PM  Between 7am to 7pm - Pager - [phone]  After 7pm go to [email or web address] - password [email or web address]  And look for the night coverage person covering for me after hours  [facility] Office  [phone]

## 2017-04-17 NOTE — Significant Event (Signed)
Rapid Response Event Note  Overview: Time Called: 9242 Arrival Time: 0559 Event Type: Other (Comment) (GI bleed)  Initial Focused Assessment: No acute distress   Interventions: None  Plan of Care (if not transferred): Awaiting CBC results.   Event Summary: Called to evaluate patient for GI bleed while on Heparin. Patients chart was reviewed. On arrival, patient is without any distress resting on the bipap. Skin is warm and dry. Reported that patient has had at least 3 bloody stools. BP >683 systolic. Patient is responsive to voice. No RRT interventions needed at this time. We will continue to assist with care as needed.   Eino Farber [place]

## 2017-04-17 NOTE — Progress Notes (Signed)
Dr. Myna Hidalgo notified that patient has started bleeding slow but steadily from rectum again.  BP soft at 107/31.  Patient is third spacing and weeping.    Heparin drip held for now until am labs result.  Patient is alert and oriented. O2 saturations WNL on 3L .  Will continue to monitor patient and report off to day shift nurse so that MD team is aware, and will address.

## 2017-04-17 NOTE — Progress Notes (Signed)
[facility] Progress Note  Chief Complaint:   Bloody stool   Subjective: Stools bloody this am. No abdominal or rectal pain.   Objective:  Vital signs in last 24 hours: Temp:  [96.4 F (35.8 C)-98.3 F (36.8 C)] 97.9 F (36.6 C) (07/01 0550) Pulse Rate:  [71-81] 77 (07/01 0550) Resp:  [17-22] 21 (07/01 0550) BP: (46-107)/(18-51) 107/31 (07/01 0550) SpO2:  [96 %-99 %] 96 % (07/01 0841) Last BM Date: 04/16/17 General:   Alert, chronically ill appearing white male in NAD EENT:  Diminished hearing. [name]  Heart:  Regular rate and rhythm; anasarca Pulm: Breathing slightly labored. He has 02 per [place]. A few expiratory wheezes at both bases. . Abdomen:  Soft, obese, nontender.  Normal bowel sounds. Abdominal wall edema Rectal:  No external lesions. Maroon stool on DRE.  Neurologic:  Alert and  oriented x4;  grossly normal neurologically. Psych:  Pleasant, cooperative.  Normal mood and affect.    Lab Results:  Recent Labs  04/15/17 0525 04/16/17 0306 04/17/17 0557  WBC 7.4 7.1 8.2  HGB 8.5* 8.6* 8.8*  HCT 25.2* 26.5* 26.7*  PLT 144* 139* 122*   BMET  Recent Labs  04/15/17 0525 04/16/17 0306  NA 134* 135  K 3.9 4.2  CL 93* 96*  CO2 25 25  GLUCOSE 101* 113*  BUN 73* 43*  CREATININE 8.23* 6.06*  CALCIUM 8.5* 8.6*   LFT  Recent Labs  04/16/17 0306  PROT 7.7  ALBUMIN 3.0*  AST 106*  ALT 25  ALKPHOS 73  BILITOT 4.7*   PT/INR  Recent Labs  04/16/17 0306 04/17/17 0557  LABPROT 20.6* 21.9*  INR 1.74 1.89   l    ASSESSMENT / PLAN:   1. 76 yo male with multiple medical problems / deconditioning admitted several days ago with hematemesis (In ED) and rectal bleeding at home in setting of anticoagulation. 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 Patient has been on heparin and coumadin restarted  yesterday. INR 1.89 today. Hgb stable but not checked since started bleeding this am.  -Recurrent bleeding may be from the gastric AVM. He is not NPO. Will plan for repeat EGD tomorrow. The risks and benefits of EGD were discussed and the patient agrees to proceed.  -clear liquids, NPO after midnight.  -IV BID PPI -check CBC now and monitor from there.   2. 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 AFIB / AVR on chronic coumadin. Because of bleed coumadin held. Has been on heparin and coumadin just restarted. Now with recurrent bleeding.  -Hold coumadin, he is actively bleeding  2. ? Cirrhosis per CT scan. Can evaluate outpatient once acute problems resolve.    3. CKD 4, progressed to ESRD. Requiring HD. Significantly volume overloaded  .   LOS: 7 days   Tye Savoy ,NP 04/17/2017, 12:04 PM  Pager number [phone]    [facility] Attending   I have taken an interval history, reviewed the chart and examined the patient. I agree with the Advanced Practitioner's note, impression and recommendations.    Recurrent bleeding - ? From AVM that was Tx Could be lower bleed but will start w/ repeat EGD tomorrow - sooner if needed  The risks and benefits as well as alternatives of endoscopic procedure(s) have been discussed and reviewed. All questions answered. The patient agrees  to proceed.  Gatha Mayer, MD, [facility] [phone] (pager) 04/17/2017 1:51 PM

## 2017-04-17 NOTE — Progress Notes (Signed)
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 The second for GI bleeding - reportedly had 3 bloody stools. H/H stable today. INR still subtherapeutic - on heparin and warfarin.   Objective   Vitals:   04/16/17 2224 04/16/17 2229 04/16/17 2337 04/17/17 0550  BP: (!) 46/34 (!) 103/51  (!) 107/31  Pulse: 78 78  77  Resp: 17   (!) 21  Temp: (!) 96.4 F (35.8 C)  97.8 F (36.6 C) 97.9 F (36.6 C)  TempSrc: Axillary  Oral Axillary  SpO2: 99%     Weight:      Height:       No intake or output data in the 24 hours ending 04/17/17 0813 Filed Weights   04/14/17 1820 04/15/17 1429 04/15/17 1811  Weight: 231 lb 7.7 oz (105 kg) 238 lb 1.6 oz (108 kg) 234 lb 2.1 oz (106.2 kg)    Physical Exam   General appearance: alert, appears older than stated age and no distress Neck: JVD - 2 cm above sternal notch and no carotid bruit Lungs: diminished breath sounds bilaterally Heart: irregularly irregular rhythm Abdomen: soft, non-tender; bowel sounds normal;  no masses,  no organomegaly Extremities: trace edema, temporary dialysis cath in right upper chest Pulses: 2+ and symmetric Skin: Skin color, texture, turgor normal. No rashes or lesions Neurologic: Grossly normal Psych: Pleasant  Inpatient Medications    Scheduled Meds: . albuterol  2.5 mg Nebulization TID  . calcium acetate  1,334 mg Oral TID WC  . guaiFENesin  600 mg Oral BID  . levothyroxine  50 mcg Oral QAC breakfast  . montelukast  10 mg Oral QHS  . multivitamin  1 tablet Oral QHS  . pantoprazole  40 mg Oral Q0600  . Warfarin - Pharmacist Dosing Inpatient   Does not apply q1800    Continuous Infusions: . sodium chloride    . sodium chloride    . sodium chloride    . sodium chloride    . ferric gluconate (FERRLECIT/NULECIT) IV Stopped (04/16/17 1756)  . heparin Stopped (04/17/17 0610)    PRN Meds: sodium chloride, sodium chloride, sodium chloride, sodium chloride, alteplase, heparin, heparin, ipratropium-albuterol, lidocaine (PF), lidocaine-prilocaine, ondansetron (ZOFRAN) IV, pentafluoroprop-tetrafluoroeth   Labs   Results for orders placed or performed during the hospital encounter of 04/10/17 (from the past 48 hour(s))  Heparin level (unfractionated)     Status: Abnormal   Collection Time: 04/15/17 12:07 PM  Result Value Ref Range  Heparin Unfractionated 0.28 (L) 0.30 - 0.70 IU/mL    Comment:        IF HEPARIN RESULTS ARE BELOW EXPECTED VALUES, AND PATIENT DOSAGE HAS BEEN CONFIRMED, SUGGEST FOLLOW UP TESTING OF ANTITHROMBIN III LEVELS.   CBC     Status: Abnormal   Collection Time: 04/16/17  3:06 AM  Result Value Ref Range   WBC 7.1 4.0 - 10.5 K/uL   RBC 2.71 (L) 4.22 - 5.81 MIL/uL   Hemoglobin 8.6 (L) 13.0 - 17.0 g/dL   HCT 26.5 (L) 39.0 - 52.0 %   MCV 97.8 78.0 - 100.0 fL   MCH 31.7 26.0 - 34.0 pg   MCHC 32.5 30.0 - 36.0 g/dL   RDW 21.3 (H) 11.5 - 15.5 %   Platelets 139 (L) 150 - 400 K/uL  Heparin level (unfractionated)     Status: None    Collection Time: 04/16/17  3:06 AM  Result Value Ref Range   Heparin Unfractionated 0.35 0.30 - 0.70 IU/mL    Comment:        IF HEPARIN RESULTS ARE BELOW EXPECTED VALUES, AND PATIENT DOSAGE HAS BEEN CONFIRMED, SUGGEST FOLLOW UP TESTING OF ANTITHROMBIN III LEVELS.   Comprehensive metabolic panel     Status: Abnormal   Collection Time: 04/16/17  3:06 AM  Result Value Ref Range   Sodium 135 135 - 145 mmol/L   Potassium 4.2 3.5 - 5.1 mmol/L   Chloride 96 (L) 101 - 111 mmol/L   CO2 25 22 - 32 mmol/L   Glucose, Bld 113 (H) 65 - 99 mg/dL   BUN 43 (H) 6 - 20 mg/dL   Creatinine, Ser 6.06 (H) 0.61 - 1.24 mg/dL   Calcium 8.6 (L) 8.9 - 10.3 mg/dL   Total Protein 7.7 6.5 - 8.1 g/dL   Albumin 3.0 (L) 3.5 - 5.0 g/dL   AST 106 (H) 15 - 41 U/L   ALT 25 17 - 63 U/L   Alkaline Phosphatase 73 38 - 126 U/L   Total Bilirubin 4.7 (H) 0.3 - 1.2 mg/dL   GFR calc non Af Amer 8 (L) >60 mL/min   GFR calc Af Amer 9 (L) >60 mL/min    Comment: (NOTE) The eGFR has been calculated using the CKD EPI equation. This calculation has not been validated in all clinical situations. eGFR's persistently <60 mL/min signify possible Chronic Kidney Disease.    Anion gap 14 5 - 15  Protime-INR     Status: Abnormal   Collection Time: 04/16/17  3:06 AM  Result Value Ref Range   Prothrombin Time 20.6 (H) 11.4 - 15.2 seconds   INR 1.74   Magnesium     Status: None   Collection Time: 04/16/17  7:34 AM  Result Value Ref Range   Magnesium 2.4 1.7 - 2.4 mg/dL  CBC     Status: Abnormal   Collection Time: 04/17/17  5:57 AM  Result Value Ref Range   WBC 8.2 4.0 - 10.5 K/uL   RBC 2.71 (L) 4.22 - 5.81 MIL/uL   Hemoglobin 8.8 (L) 13.0 - 17.0 g/dL   HCT 26.7 (L) 39.0 - 52.0 %   MCV 98.5 78.0 - 100.0 fL   MCH 32.5 26.0 - 34.0 pg   MCHC 33.0 30.0 - 36.0 g/dL   RDW 22.6 (H) 11.5 - 15.5 %   Platelets 122 (L) 150 - 400 K/uL  Heparin level (unfractionated)     Status: Abnormal   Collection Time: 04/17/17  5:57 AM  Result  Value Ref Range   Heparin Unfractionated 0.26 (L) 0.30 - 0.70 IU/mL    Comment:        IF HEPARIN RESULTS ARE BELOW EXPECTED VALUES, AND PATIENT DOSAGE HAS BEEN CONFIRMED, SUGGEST FOLLOW UP TESTING OF ANTITHROMBIN III LEVELS.   Protime-INR     Status: Abnormal   Collection Time: 04/17/17  5:57 AM  Result Value Ref Range   Prothrombin Time 21.9 (H) 11.4 - 15.2 seconds   INR 1.89     ECG   (6/25) a-fib with CVR, LBBB - Personally Reviewed  Telemetry   A-fib - Personally Reviewed  Radiology    Dg Chest Port 1 View  Result Date: 04/16/2017 CLINICAL DATA:  76 year old male with shortness of breath EXAM: PORTABLE CHEST 1 VIEW COMPARISON:  Prior chest x-ray 04/10/2017; prior chest CT 04/11/2017 FINDINGS: Right IJ tunneled hemodialysis catheter. The catheter tip projects over the mid right atrium. Stable cardiomegaly. 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 Atherosclerotic calcifications again noted in the transverse aorta. There is a pulmonary vascular congestion bordering on mild edema. The previously noted loculated pleural fluid is no longer evident. No acute osseous abnormality. IMPRESSION: 1. Pulmonary vascular congestion with mild interstitial edema. 2. Bilateral layering pleural effusions. 3. Stable cardiomegaly. 4.  Aortic Atherosclerosis (ICD10-170.0) 5. Right IJ tunneled hemodialysis catheter in good position with the tip overlying the mid right atrium. Electronically Signed   By: Jacqulynn Cadet M.D.   On: 04/16/2017 10:53    Cardiac Studies   Indications:      CHF - 428.0.  ------------------------------------------------------------------- History:   PMH:  [employer] AVR. Ischemic Cardiomyopathy. Chronic Kidney Disease. Abdominal Aortic Aneurysm.  Murmur.  Coronary artery disease.  Chronic obstructive pulmonary disease.  Risk factors:  Hypertension.  Dyslipidemia.  ------------------------------------------------------------------- Study Conclusions  - Left ventricle: Poor acoiustic windows limit study, even with   Definity. LVEF is approximately 35%with hypokinesis worse inf the   inferior and septal walls. The cavity size was severely dilated.   Wall thickness was normal. - Aortic valve: Poor acoustic windows limit study AV prosthesis is   difficult to see Peak and mean gradients through the valve are 35   and 18 mm Hg respectively. There is trace perivalvular AI. There   was mild regurgitation. Valve area (Vmean): 0.62 cm^2. - Mitral valve: Calcified annulus. Mildly thickened leaflets .   There was mild regurgitation. - Left atrium: The atrium was moderately dilated. - Right ventricle: Systolic function was mildly reduced. - Right atrium: The atrium was severely dilated. - Pulmonary arteries: PA peak pressure: 37 mm Hg (S). - Pericardium, extracardiac: A trivial pericardial effusion was   identified.  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  Shelton has had a new decline in LVEF to 35%. GI bleeding reported per rectum overnight, but H/H stable - does not appear to be hemodynamically significant. Heparin being held. INR remains  subtherapeutic. Monitor H/H closely. Volume overloaded - will need dialysis again today. Code status was re-addressed by Dr. Ree Kida and myself - he feels better today, not clear that he wants DNR. Will probably need to have a discussion with him and the family.  Time Spent Directly with Patient:  I have spent a total of 15 minutes with the patient reviewing hospital notes, telemetry, EKGs, labs and examining the patient as well as establishing an assessment and plan that was discussed personally with the patient. > 50% of time was spent in direct patient care.  Length of Stay:  LOS: 7 days   Pixie Casino, MD, [place]  Attending Cardiologist  Direct Dial: [phone]  Fax: [phone]  Website:  [email or web address] 04/17/2017, 8:13 AM

## 2017-04-17 NOTE — Progress Notes (Signed)
Pt transferred to 4E12, report to [facility]. Pt A7Ox4, VSS, pt belongings at bedside. Dentures given to care nurse. Family notified.

## 2017-04-17 NOTE — Progress Notes (Signed)
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 Cirrhosis by CT 7 Gen weakness Plan: HD again now in progress  Was not able to be dialyzed yesterday due to BP machine issues. BP soft. Started on midodrine,  Current BP 96 syt so will add IV albumin; EF 35% with severely dilated RA.  Using [facility] and has LUE AVF.  Low BP poss related to liver disease.  Will be problematic with fluid removal.     LOS: 7 days   Antaeus Karel C 04/17/2017,4:10 PM

## 2017-04-17 NOTE — Plan of Care (Signed)
Problem: Skin Integrity: Goal: Risk for impaired skin integrity will decrease Outcome: Not Progressing Pt skin is extremely weeping and swollen with multiple skin tear and abrasions , q 2 hour  Turn and foams placed

## 2017-04-17 NOTE — Progress Notes (Signed)
Placed pt on Cpap auto titrate full face mask with 02 bled in tolerating well

## 2017-04-18 ENCOUNTER — Encounter (HOSPITAL_COMMUNITY)
Admission: EM | Disposition: A | Discharge status: Hospice | Payer: Self-pay | Source: Home / Self Care | Attending: Internal Medicine

## 2017-04-18 ENCOUNTER — Inpatient Hospital Stay (HOSPITAL_COMMUNITY): Payer: PPO | Admitting: Anesthesiology

## 2017-04-18 ENCOUNTER — Encounter (HOSPITAL_COMMUNITY): Payer: Self-pay

## 2017-04-18 DIAGNOSIS — K2970 Gastritis, unspecified, without bleeding: Secondary | ICD-10-CM

## 2017-04-18 DIAGNOSIS — K2990 Gastroduodenitis, unspecified, without bleeding: Secondary | ICD-10-CM

## 2017-04-18 HISTORY — PX: ESOPHAGOGASTRODUODENOSCOPY: SHX5428

## 2017-04-18 LAB — CBC
HCT: 25.8 % — ABNORMAL LOW (ref 39.0–52.0)
HCT: 26.8 % — ABNORMAL LOW (ref 39.0–52.0)
HEMOGLOBIN: 8.7 g/dL — AB (ref 13.0–17.0)
Hemoglobin: 8.8 g/dL — ABNORMAL LOW (ref 13.0–17.0)
MCH: 32.5 pg (ref 26.0–34.0)
MCH: 33.2 pg (ref 26.0–34.0)
MCHC: 34.1 g/dL (ref 30.0–36.0)
MCHC: 32.5 g/dL (ref 30.0–36.0)
MCV: 100 fL (ref 78.0–100.0)
MCV: 97.4 fL (ref 78.0–100.0)
PLATELETS: 100 10*3/uL — AB (ref 150–400)
Platelets: 107 10*3/uL — ABNORMAL LOW (ref 150–400)
RBC: 2.65 MIL/uL — ABNORMAL LOW (ref 4.22–5.81)
RBC: 2.68 MIL/uL — ABNORMAL LOW (ref 4.22–5.81)
RDW: 23.3 % — ABNORMAL HIGH (ref 11.5–15.5)
RDW: 23.9 % — AB (ref 11.5–15.5)
WBC: 8.8 10*3/uL (ref 4.0–10.5)
WBC: 9.2 10*3/uL (ref 4.0–10.5)

## 2017-04-18 LAB — BLOOD GAS, ARTERIAL
Acid-Base Excess: 2.2 mmol/L — ABNORMAL HIGH (ref 0.0–2.0)
BICARBONATE: 26 mmol/L (ref 20.0–28.0)
Delivery systems: POSITIVE
Drawn by: 275531
O2 CONTENT: 2 L/min
O2 SAT: 94.5 %
PATIENT TEMPERATURE: 98.6
PO2 ART: 72.6 mmHg — AB (ref 83.0–108.0)
pCO2 arterial: 38.9 mmHg (ref 32.0–48.0)
pH, Arterial: 7.441 (ref 7.350–7.450)

## 2017-04-18 LAB — COMPREHENSIVE METABOLIC PANEL
ALT: 26 U/L (ref 17–63)
ANION GAP: 14 (ref 5–15)
AST: 105 U/L — ABNORMAL HIGH (ref 15–41)
Albumin: 3.1 g/dL — ABNORMAL LOW (ref 3.5–5.0)
Alkaline Phosphatase: 67 U/L (ref 38–126)
BUN: 40 mg/dL — ABNORMAL HIGH (ref 6–20)
CALCIUM: 8.6 mg/dL — AB (ref 8.9–10.3)
CHLORIDE: 94 mmol/L — AB (ref 101–111)
CO2: 25 mmol/L (ref 22–32)
CREATININE: 5.35 mg/dL — AB (ref 0.61–1.24)
GFR, EST AFRICAN AMERICAN: 11 mL/min — AB (ref >60)
GFR, EST NON AFRICAN AMERICAN: 9 mL/min — AB (ref >60)
Glucose, Bld: 89 mg/dL (ref 65–99)
Potassium: 4.6 mmol/L (ref 3.5–5.1)
Sodium: 133 mmol/L — ABNORMAL LOW (ref 135–145)
Total Bilirubin: 5.8 mg/dL — ABNORMAL HIGH (ref 0.3–1.2)
Total Protein: 7.2 g/dL (ref 6.5–8.1)

## 2017-04-18 LAB — PROTIME-INR
INR: 1.85
PROTHROMBIN TIME: 21.6 s — AB (ref 11.4–15.2)

## 2017-04-18 SURGERY — EGD (ESOPHAGOGASTRODUODENOSCOPY)
Anesthesia: Monitor Anesthesia Care

## 2017-04-18 MED ORDER — BUTAMBEN-TETRACAINE-BENZOCAINE 2-2-14 % EX AERO
INHALATION_SPRAY | CUTANEOUS | Status: DC | PRN
  Administered 2017-04-18: 1 via TOPICAL

## 2017-04-18 MED ORDER — SODIUM CHLORIDE 0.9 % IV SOLN: 0.0000 ug/min | INTRAVENOUS | Status: DC

## 2017-04-18 MED ORDER — SUCRALFATE 1 GM/10ML PO SUSP
1.0000 g | Freq: Four times a day (QID) | ORAL | Status: DC
  Administered 2017-04-18 – 2017-04-21 (×10): 1 g via ORAL
  Filled 2017-04-18 (×11): qty 10

## 2017-04-18 MED ORDER — PHENYLEPHRINE HCL 10 MG/ML IJ SOLN
INTRAMUSCULAR | Status: DC | PRN
  Administered 2017-04-18: 200 ug via INTRAVENOUS
  Administered 2017-04-18: 160 ug via INTRAVENOUS
  Administered 2017-04-18 (×2): 200 ug via INTRAVENOUS

## 2017-04-18 MED ORDER — DARBEPOETIN ALFA 60 MCG/0.3ML IJ SOSY
60.0000 ug | PREFILLED_SYRINGE | INTRAMUSCULAR | Status: DC
  Administered 2017-04-19: 60 ug via INTRAVENOUS
  Filled 2017-04-18: qty 0.3

## 2017-04-18 MED ORDER — PROPOFOL 500 MG/50ML IV EMUL
INTRAVENOUS | Status: DC | PRN
  Administered 2017-04-18: 125 ug/kg/min via INTRAVENOUS

## 2017-04-18 MED ORDER — PROPOFOL 10 MG/ML IV BOLUS
INTRAVENOUS | Status: DC | PRN
  Administered 2017-04-18: 30 mg via INTRAVENOUS

## 2017-04-18 MED ORDER — LIDOCAINE HCL (CARDIAC) 20 MG/ML IV SOLN
INTRAVENOUS | Status: DC | PRN
  Administered 2017-04-18: 100 mg via INTRATRACHEAL

## 2017-04-18 MED ORDER — HEPARIN (PORCINE) IN NACL 100-0.45 UNIT/ML-% IJ SOLN
1350.0000 [IU]/h | INTRAMUSCULAR | Status: DC
  Administered 2017-04-18: 1250 [IU]/h via INTRAVENOUS
  Filled 2017-04-18: qty 250

## 2017-04-18 MED ORDER — EPHEDRINE SULFATE 50 MG/ML IJ SOLN
INTRAMUSCULAR | Status: DC | PRN
  Administered 2017-04-18: 10 mg via INTRAVENOUS
  Administered 2017-04-18: 25 mg via INTRAVENOUS
  Administered 2017-04-18: 15 mg via INTRAVENOUS

## 2017-04-18 MED ORDER — PHENYLEPHRINE HCL 10 MG/ML IJ SOLN
INTRAVENOUS | Status: DC | PRN
  Administered 2017-04-18: 50 ug/min via INTRAVENOUS

## 2017-04-18 MED ORDER — CALCIUM CHLORIDE 10 % IV SOLN
INTRAVENOUS | Status: DC | PRN
Start: 2017-04-18 — End: 2017-04-18
  Administered 2017-04-18: 100 mg via INTRAVENOUS
  Administered 2017-04-18 (×2): 200 mg via INTRAVENOUS
  Administered 2017-04-18: 100 mg via INTRAVENOUS

## 2017-04-18 NOTE — Progress Notes (Signed)
Palliative consult received. Patient is in endoscopy- chart reviewed in detail. Will attempt to see later this afternoon depending on his ability to participate after procedure and family availability. He is high risk for sudden death or rapid decompensation given both his acute and chronic disease burdens and needs a discussion about goals of care and planning as soon as possible.  Lane Hacker, DO Palliative Medicine

## 2017-04-18 NOTE — Progress Notes (Signed)
PROGRESS NOTE    Robert Stanton  JEH:[id number] DOB: 01/19/1941 DOA: 04/10/2017 PCP: Unk Pinto, MD   Chief Complaint  Patient presents with  . Nausea  . Dizziness    Brief Narrative:  HPI on 04/10/2017 by Dr. Jenetta Downer Robert Stanton is a 76 y.o. male with medical history significant for- hypertension, coronary artery disease- CABG- 2010,  atrial fibrillation CKD5, hypertension, esophageal strictures, gout, COPD, OSA, mechanical aortic valve, hypothyroidism, peripheral artery disease, type II DM. Patient came in with complaints of bleeding from his rectum was started about 3 days ago, he also reports dark stools- 3-4 days. He denies abdominal pain. In the ED he had an episode of vomiting- with coffee-ground emesis.  He endorses dizziness that started Saturday morning- 04/09/17. This appears to be a chronic issue for patient. Pt denies NSAID use. Patient reports a history of peptic ulcer disease diagnosed with EGD- several years ago. Last EGD 10/2012- was esophageal stricture and erosions, erosive gastritis. Last colonoscopy was about 10 years ago- reports it was normal. He also notes a chronic cough that is productive today, though he has a history of COPD. His shortness of breath is at baseline. He denies fever or chills.  Interim history Patient was found to have heme positive stools, with increasing creatinine and elevated INR. He was admitted for evaluation of GI bleeding. Patient underwent EGD showing AVMs status post hemostatic endoscopy with APC. Nephrology also consulted and hemodialysis initiated. Patient started to have GIBleed again, anticoag held. Plan for ED on 04/18/17. Continues to be volume overloaded.   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 Esophageal stricture without  esophagitis. Moderately large hiatal hernia with Robert Stanton erosions. -Per gastroenterology, patient may resume anticoagulation -Hemoglobin currently 8.7 (baseline hemoglobin 8-9) -Continue to monitor CBC -Continue iron supplementation 325mg  BID -Overnight, patient supposedly had several bloody bowel movements, however hemoglobin currently 8.8 (higher than the past few days) -GI consulted and appreciated, plan for repeat EGD today -Patient was placed on conservative heparin bridge with Coumadin. Will await further recommendations from GI regarding anticoagulation. INR still subtherapeutic, INR 1.85 -Placed on IV protonix  Acute renal failure on chronic kidney disease, stage IV with oliguria -Creatinine continued to worsen, peaked at 11.8 -Nephrology consult and appreciated, hemodialysis initiated -Patient was given IV diuresis however remained oliguric with fluid overload -Interventional radiology consulted and appreciated for HD catheter placement -Of note, patient did have aVF on left upper extremity 03/04/2017 -Placed on phoslo, renavit -Discussed midodrine with nephrology, continue 5mg  BID -Patient did have HD on 7/2. Remains very volume overloaded  Acute on Chronic systolic heart failure -Echocardiogram EF 35% with hypokinesis worse in the inferior and septal walls -Last echocardiogram 05/20/2016 showed an EF of 45%, basal to mid inferior severe hypokinesis. Indeterminate diastolic function -Patient was given diuresis per nephrology however patient continued to remain oliguric -Continue volume control with HD -Monitor intake and output, daily weights -Cardiology consulted and appreciated  Somnolence -During evaluation of patient this morning, patient did have CPAP mask in place, however, the machine was not running. SpO2 remained in the 90s however patient did seem more somnolent. Will obtain ABG to evaluate CO2 (?narcosis).  -Discussed with RN at bedside. Replaced O2  monitor -Continue to monitor closely  Right loculated pleural effusion -No signs of an infection at this time -CT chest: Right-sided calcified pleural plaque,  nonspecific with differential including asbestosis related pleural disease versus pleural insult such as trauma or infection. Small bilateral pleural effusions, probably loculated bilaterally with small loculated components. Right major fissure.  Atrial fibrillation/aortic valve replacement -Patient presented with supratherapeutic INR, given vitamin K as well as fresh frozen plasma -INR currently 1.85 -Restarted coumadin with conservative heparin bridge- however currently held as patient began to have recurrent GI bleeding overnight -hemoglobin currently stable  Dyspnea -Possibly secondary to volume overload -continue hemodialysis and supplement O2 as needed  Essential hypertension complicated by ?hypotension -Manual BP stable -Continue midodrine  Diabetes mellitus, type II -Hemoglobin A1c was 5.1 on 03/05/2017  Atherosclerotic thoracic aorta -4.3 cm ectatic ascending thoracic aorta noted on CT scan -Patient will need annual imaging by CTA or MRA  Possible cirrhosis/ elevated AST -Noted on CT chest -AST improving slightly, currently 105 (per patient and son, no longer consumes alcohol) -Suspect elevated AST secondary to medications -Reviewed medications with pharmacy. One, Synthroid, Protonix, Lasix may all increased LFTs. CRP also increase AST. Lasix has been discontinued. Given that patient currently requires Protonix for GI bleed as well as heparin for bridging of Coumadin, will continue these medications and to monitor LFTs closely.   Mildly elevated troponin -Likely secondary to the above, currently chest pain-free -Troponin elevation not consistent with ACS -Echocardiogram as above  NSVT -Overnight 04/15/17, had 9beat run of VT -Magnesium 2.4  -Potassium 4.6 -Continue to monitor   Hypothyroidism -Continue  Synthroid  Nausea -possibly related to the start of hemodialysis -Currently denies further nausea, Zofran has been ordered however patient has not used.  Obstructive sleep apnea -Continue CPAP QHS  Goals of care/Code status -Discussed code status with patient, he is not sure if he wants CPR. Will consult palliative care as patient is at high risk for decompensation given acute CHF exac with new start of HD and recurrent GI bleeding.  -Will transfer patient to stepdown for closer monitoring   DVT Prophylaxis  Coumadin/heparin --> SCDs  Code Status: Full  Family Communication: None at bedside. Family via phone  Disposition Plan: Admitted. Continue to monitor in stepdown. Pending EGD and continued HD. Dispo TBD  Consultants Nephrology Gastroenterology Interventional radiology  Cardiology Palliative care  Procedures  EGD  Right IJ HD tunneled catheter placement  Antibiotics   Anti-infectives    Start     Dose/Rate Route Frequency Ordered Stop   04/12/17 1407  ceFAZolin (ANCEF) 2-4 GM/100ML-% IVPB    Comments:  Dhers, Patricia   : cabinet override      04/12/17 1407 04/13/17 0214   04/12/17 1100  ceFAZolin (ANCEF) IVPB 2g/100 mL premix     2 g 200 mL/hr over 30 Minutes Intravenous On call 04/12/17 1019 04/12/17 1443      Subjective:   Robert Stanton seen and examined today. Patient states he is feeling better, however falls asleep quickly. Denies chest pain or abdominal pain.   Objective:   Vitals:   04/17/17 2339 04/18/17 0257 04/18/17 0700 04/18/17 0714  BP: (!) 110/55 (!) 109/54 (!) 108/58 (!) 108/58  Pulse: 76 71 71 69  Resp: 17 14 12 11   Temp:  97.9 F (36.6 C) 97.7 F (36.5 C)   TempSrc:  Axillary Axillary   SpO2: 94% 96% 96% 100%  Weight:      Height:        Intake/Output Summary (Last 24 hours) at 04/18/17 0912 Last data filed at 04/17/17 1919  Gross per 24 hour  Intake  0 ml  Output             1827 ml  Net            -1827 ml    Filed Weights   04/14/17 1820 04/15/17 1429 04/15/17 1811  Weight: 105 kg (231 lb 7.7 oz) 108 kg (238 lb 1.6 oz) 106.2 kg (234 lb 2.1 oz)   Exam  General: Well developed, chronically ill appearing, NAD (somnolent)  HEENT: NCAT, mucous membranes moist. (CPAP mask in place)  Cardiovascular: S1 S2 auscultated, RRR, +click  Respiratory:Diminished breath sounds, expiratory wheezing  Abdomen: Soft, nontender, nondistended, + bowel sounds  Extremities: warm dry without cyanosis clubbing. +3LE edema extending to upper thigh B/L   Neuro: Arousable, however somnolent. AAOx3, nonfocal   Psych: Unable to fully assess given somnolence  Data Reviewed: I have personally reviewed following labs and imaging studies  CBC:  Recent Labs Lab 04/17/17 0557 04/17/17 1248 04/17/17 2006 04/18/17 0042 04/18/17 0742  WBC 8.2 8.3 8.4 8.8 9.2  HGB 8.8* 8.6* 8.8* 8.8* 8.7*  HCT 26.7* 26.7* 26.3* 25.8* 26.8*  MCV 98.5 98.2 97.8 97.4 100.0  PLT 122* 128* 116* 100* 937*   Basic Metabolic Panel:  Recent Labs Lab 04/12/17 0154 04/13/17 0715 04/14/17 1696 04/15/17 0525 04/16/17 0306 04/16/17 0734 04/17/17 1248  NA 132* 133* 131* 134* 135  --  135  K 4.0 4.3 4.3 3.9 4.2  --  4.9  CL 93* 92* 93* 93* 96*  --  95*  CO2 21* 22 21* 25 25  --  25  GLUCOSE 129* 118* 107* 101* 113*  --  115*  BUN 144* 155* 111* 73* 43*  --  68*  CREATININE 10.95* 11.80* 10.21* 8.23* 6.06*  --  8.22*  CALCIUM 8.4* 8.0* 8.3* 8.5* 8.6*  --  9.0  MG  --   --   --   --   --  2.4  --   PHOS 7.0* 8.2*  --   --   --   --   --    GFR: Estimated Creatinine Clearance: 9.3 mL/min (A) (by C-G formula based on SCr of 8.22 mg/dL (H)). Liver Function Tests:  Recent Labs Lab 04/13/17 0714 04/13/17 0715 04/14/17 7893 04/15/17 0525 04/16/17 0306 04/17/17 1248  AST  --   --  126* 105* 106* 99*  ALT 51  --  39 28 25 24   ALKPHOS  --   --  63 63 73 67  BILITOT  --   --  3.9* 4.3* 4.7* 5.0*  PROT  --   --  7.3 7.7 7.7  7.7  ALBUMIN  --  2.9* 2.9* 3.0* 3.0* 3.0*   No results for input(s): LIPASE, AMYLASE in the last 168 hours. No results for input(s): AMMONIA in the last 168 hours. Coagulation Profile:  Recent Labs Lab 04/14/17 0623 04/15/17 0525 04/16/17 0306 04/17/17 0557 04/18/17 0742  INR 1.58 1.55 1.74 1.89 1.85   Cardiac Enzymes:  Recent Labs Lab 04/11/17 1425  TROPONINI 0.05*   BNP (last 3 results) No results for input(s): PROBNP in the last 8760 hours. HbA1C: No results for input(s): HGBA1C in the last 72 hours. CBG: No results for input(s): GLUCAP in the last 168 hours. Lipid Profile: No results for input(s): CHOL, HDL, LDLCALC, TRIG, CHOLHDL, LDLDIRECT in the last 72 hours. Thyroid Function Tests: No results for input(s): TSH, T4TOTAL, FREET4, T3FREE, THYROIDAB in the last 72 hours. Anemia Panel: No results for input(s): VITAMINB12, FOLATE,  FERRITIN, TIBC, IRON, RETICCTPCT in the last 72 hours. Urine analysis:    Component Value Date/Time   COLORURINE YELLOW 09/26/2016 1939   APPEARANCEUR CLEAR 09/26/2016 1939   LABSPEC 1.005 09/26/2016 1939   PHURINE 5.0 09/26/2016 1939   GLUCOSEU NEGATIVE 09/26/2016 1939   HGBUR MODERATE (A) 09/26/2016 1939   BILIRUBINUR NEGATIVE 09/26/2016 1939   KETONESUR NEGATIVE 09/26/2016 1939   PROTEINUR NEGATIVE 09/26/2016 1939   NITRITE NEGATIVE 09/26/2016 1939   LEUKOCYTESUR NEGATIVE 09/26/2016 1939   Sepsis Labs: @LABRCNTIP (procalcitonin:4,lacticidven:4)  ) Recent Results (from the past 240 hour(s))  MRSA PCR Screening     Status: None   Collection Time: 04/17/17  2:31 PM  Result Value Ref Range Status   MRSA by PCR NEGATIVE NEGATIVE Final    Comment:        The GeneXpert MRSA Assay (FDA approved for NASAL specimens only), is one component of a comprehensive MRSA colonization surveillance program. It is not intended to diagnose MRSA infection nor to guide or monitor treatment for MRSA infections.       Radiology  Studies: Dg Chest Port 1 View  Result Date: 04/16/2017 CLINICAL DATA:  76 year old male with shortness of breath EXAM: PORTABLE CHEST 1 VIEW COMPARISON:  Prior chest x-ray 04/10/2017; prior chest CT 04/11/2017 FINDINGS: Right IJ tunneled hemodialysis catheter. The catheter tip projects over the mid right atrium. Stable cardiomegaly. 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 Atherosclerotic calcifications again noted in the transverse aorta. There is a pulmonary vascular congestion bordering on mild edema. The previously noted loculated pleural fluid is no longer evident. No acute osseous abnormality. IMPRESSION: 1. Pulmonary vascular congestion with mild interstitial edema. 2. Bilateral layering pleural effusions. 3. Stable cardiomegaly. 4.  Aortic Atherosclerosis (ICD10-170.0) 5. Right IJ tunneled hemodialysis catheter in good position with the tip overlying the mid right atrium. Electronically Signed   By: Jacqulynn Cadet M.D.   On: 04/16/2017 10:53     Scheduled Meds: . albuterol  2.5 mg Nebulization TID  . calcium acetate  1,334 mg Oral TID WC  . guaiFENesin  600 mg Oral BID  . levothyroxine  50 mcg Oral QAC breakfast  . midodrine  5 mg Oral BID WC  . montelukast  10 mg Oral QHS  . multivitamin  1 tablet Oral QHS  . pantoprazole (PROTONIX) IV  40 mg Intravenous Q12H   Continuous Infusions: . sodium chloride    . sodium chloride    . sodium chloride    . sodium chloride    . ferric gluconate (FERRLECIT/NULECIT) IV Stopped (04/16/17 1756)     LOS: 8 days   Time Spent in minutes   40 minutes  Robert Stanton D.O. on 04/18/2017 at 9:12 AM  Between 7am to 7pm - Pager - [phone]  After 7pm go to [email or web address] - password [email or web address]  And look for the night coverage person covering for me after hours  [facility] Office  [phone]

## 2017-04-18 NOTE — Progress Notes (Signed)
ANTICOAGULATION CONSULT NOTE - Follow Up Consult  Pharmacy Consult for Heparin Indication: atrial fibrillation and mechanical atrial valve  Patient Measurements: Height: 5\' 8"  (172.7 cm) Weight:  (unable to obtain on bed scale) IBW/kg (Calculated) : 68.4 Heparin Dosing Weight: 93 kg  Vital Signs: Temp: 97.4 F (36.3 C) (07/02 1545) Temp Source: Oral (07/02 1545) BP: 91/50 (07/02 1652) Pulse Rate: 76 (07/02 1652)  Labs:  Recent Labs  04/16/17 0306 04/17/17 0557 04/17/17 1248 04/17/17 2006 04/18/17 0042 04/18/17 0742  HGB 8.6* 8.8* 8.6* 8.8* 8.8* 8.7*  HCT 26.5* 26.7* 26.7* 26.3* 25.8* 26.8*  PLT 139* 122* 128* 116* 100* 107*  LABPROT 20.6* 21.9*  --   --   --  21.6*  INR 1.74 1.89  --   --   --  1.85  HEPARINUNFRC 0.35 0.26*  --   --   --   --   CREATININE 6.06*  --  8.22*  --   --  5.35*    Estimated Creatinine Clearance: 14.3 mL/min (A) (by C-G formula based on SCr of 5.35 mg/dL (H)).  Assessment:  76 yo M admitted 6/24 with rectal bleeding.  Pt was on Coumadin PTA for hx mechanical AVR and afib.  Coumadin was held and reversed with Vitamin K and FFP in the setting of acute GIB and supratherapeutic INR. Bleeding gastric AVM successfully treated. Noted large hiatal hernia with [name] erosions. GI notes expect chronic low-grade oozing over time. Protonix 40 mg daily was to continue indefinitely, as well as oral iron BID.  Currently off oral iron -> day #5 of 5 IV iron.  Off ASA 81 mg. Also off Amiodarone, which may also effect Coumadin requirements.    Anticoagulation with heparin >> Coumadin was restarted 6/27, then held on 6/30 due to bloody stools.  Now s/p EGD, to resume heparin but not Coumadin yet. Hgb low stable, platelet count has trended down to 107.   RN reports smear of blood with BM today.    Had been going slowly with Coumadin dosing and using low heparin level range with recent bleeding. Levels have been 0.26-0.35.    Received Coumadin 2.5 mg daily x 4  days, last dose 6/30.  Protonix is now BID and Carafate q6hrs has been added.     T bili trended up to 5.8,  AST 105, same. Off Amiodarone since admitted.     Most recent Coumadin regimen:  2.5 mg daily except none on Mondays and Thursdays.  Last outpatient INR was 3.3 on 04/05/17 on 2.5 mg daily. Regimen adjusted that day to skip Coumadin 2 days a week. Confirmed with Dr. Idell Pickles office.     Goal of Therapy: Heparin level 0.3-0.5 units/ml (with recent bleed) Monitor platelets by anticoagulation protocol: Yes   Plan:   Resume heparin drip at 1250 units/hr.  Heparin level ~8 hrs after drip resumes  Daily heparin level, PT/INR and CBC.  Monitor for any bleeding.   Discussed with wife and son in the room. Patient sleepy from EGD.   Arty Baumgartner, [place] Pager: [phone] 04/18/2017,5:27 PM

## 2017-04-18 NOTE — Care Management Important Message (Signed)
Important Message  Patient Details  Name: Robert Stanton MRN: [id number] Date of Birth: 10/20/1940   Medicare Important Message Given:  Yes    Nathen May 04/18/2017, 10:24 AM

## 2017-04-18 NOTE — Anesthesia Procedure Notes (Signed)
Procedure Name: MAC Date/Time: 04/18/2017 1:37 PM Performed by: Lance Coon Pre-anesthesia Checklist: Patient identified, Emergency Drugs available, Suction available, Patient being monitored and Timeout performed Patient Re-evaluated:Patient Re-evaluated prior to inductionOxygen Delivery Method: Nasal cannula

## 2017-04-18 NOTE — Progress Notes (Signed)
Progress Note  Patient Name: Robert Stanton Date of Encounter: 04/18/2017  Primary Cardiologist: Dr [name]  Subjective   Pt denies CP or dyspnea  Inpatient Medications    Scheduled Meds: . albuterol  2.5 mg Nebulization TID  . calcium acetate  1,334 mg Oral TID WC  . guaiFENesin  600 mg Oral BID  . levothyroxine  50 mcg Oral QAC breakfast  . midodrine  5 mg Oral BID WC  . montelukast  10 mg Oral QHS  . multivitamin  1 tablet Oral QHS  . pantoprazole (PROTONIX) IV  40 mg Intravenous Q12H   Continuous Infusions: . sodium chloride    . sodium chloride    . sodium chloride    . sodium chloride    . ferric gluconate (FERRLECIT/NULECIT) IV Stopped (04/16/17 1756)   PRN Meds: sodium chloride, sodium chloride, sodium chloride, sodium chloride, alteplase, heparin, heparin, ipratropium-albuterol, lidocaine (PF), lidocaine-prilocaine, ondansetron (ZOFRAN) IV, pentafluoroprop-tetrafluoroeth   Vital Signs    Vitals:   04/18/17 0257 04/18/17 0700 04/18/17 0714 04/18/17 0917  BP: (!) 109/54 (!) 108/58 (!) 108/58   Pulse: 71 71 69   Resp: 14 12 11    Temp: 97.9 F (36.6 C) 97.7 F (36.5 C)    TempSrc: Axillary Axillary    SpO2: 96% 96% 100% 99%  Weight:      Height:        Intake/Output Summary (Last 24 hours) at 04/18/17 0935 Last data filed at 04/17/17 1919  Gross per 24 hour  Intake                0 ml  Output             1827 ml  Net            -1827 ml   Filed Weights   04/14/17 1820 04/15/17 1429 04/15/17 1811  Weight: 105 kg (231 lb 7.7 oz) 108 kg (238 lb 1.6 oz) 106.2 kg (234 lb 2.1 oz)    Telemetry    Atrial fibrillation with PVCs or aberrantly conducted beats. - Personally Reviewed   Physical Exam   GEN: No acute distress.  Chronically ill appearing Neck: supple Cardiac: irregular, crisp mechanical valve sound  Respiratory: Diminished BS bases GI: Soft, nontender, non-distended  MS: 1+ edema Neuro:  Nonfocal  Psych: Normal affect   Labs      Chemistry Recent Labs Lab 04/16/17 0306 04/17/17 1248 04/18/17 0742  NA 135 135 133*  K 4.2 4.9 4.6  CL 96* 95* 94*  CO2 25 25 25   GLUCOSE 113* 115* 89  BUN 43* 68* 40*  CREATININE 6.06* 8.22* 5.35*  CALCIUM 8.6* 9.0 8.6*  PROT 7.7 7.7 7.2  ALBUMIN 3.0* 3.0* 3.1*  AST 106* 99* 105*  ALT 25 24 26   ALKPHOS 73 67 67  BILITOT 4.7* 5.0* 5.8*  GFRNONAA 8* 6* 9*  GFRAA 9* 7* 11*  ANIONGAP 14 15 14      Hematology Recent Labs Lab 04/17/17 2006 04/18/17 0042 04/18/17 0742  WBC 8.4 8.8 9.2  RBC 2.69* 2.65* 2.68*  HGB 8.8* 8.8* 8.7*  HCT 26.3* 25.8* 26.8*  MCV 97.8 97.4 100.0  MCH 32.7 33.2 32.5  MCHC 33.5 34.1 32.5  RDW 23.3* 23.3* 23.9*  PLT 116* 100* 107*    Cardiac Enzymes Recent Labs Lab 04/11/17 1425  TROPONINI 0.05*    Radiology    Dg Chest Port 1 View  Result Date: 04/16/2017 CLINICAL DATA:  76 year old male with shortness  of breath EXAM: PORTABLE CHEST 1 VIEW COMPARISON:  Prior chest x-ray 04/10/2017; prior chest CT 04/11/2017 FINDINGS: Right IJ tunneled hemodialysis catheter. The catheter tip projects over the mid right atrium. Stable cardiomegaly. 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 Atherosclerotic calcifications again noted in the transverse aorta. There is a pulmonary vascular congestion bordering on mild edema. The previously noted loculated pleural fluid is no longer evident. No acute osseous abnormality. IMPRESSION: 1. Pulmonary vascular congestion with mild interstitial edema. 2. Bilateral layering pleural effusions. 3. Stable cardiomegaly. 4.  Aortic Atherosclerosis (ICD10-170.0) 5. Right IJ tunneled hemodialysis catheter in good position with the tip overlying the mid right atrium. Electronically Signed   By: Jacqulynn Cadet M.D.   On: 04/16/2017 10:53    Patient Profile     76 y.o. male with past medical history of coronary artery disease status post coronary artery bypass graft, prior [employer]. Jude aortic valve  replacement, permanent atrial fibrillation, COPD, PAD and renal insufficiency admitted with GI bleed. Echocardiogram showed worsening LV function and cardiology asked to evaluate. Note hemodialysis initiated.  EGD revealed AVMs with hemostatic endoscopy.  Assessment & Plan    1 cardiomyopathy-LV function mildly reduced compared to previous. We cannot add a beta blocker as his heart rate is low normal at baseline in atrial fibrillation. His blood pressure will not allow hydralazine/nitrates.  2 permanent atrial fibrillation-as outlined above heart rate is controlled on no medications. Would resume anticoagulation when it is clear GI bleeding is stable.  3 status post aortic valve replacement-Resume anticoagulation when GI bleeding stable.  4 acute on chronic stage IV kidney disease-dialysis has now been initiated.  5 GI bleed-recurrent bleeding. For EGD today. GI following.  6 thoracic aortic aneurysm-noted on CT scan. Measures 4.3 cm. Would not pursue further imaging as he is not a candidate at present for intervention.  7 coronary artery disease status post coronary artery bypass graft-aspirin is on hold because of GI bleed. Resume when able. Resume statin at discharge.  Signed, Kirk Ruths, MD  04/18/2017, 9:35 AM

## 2017-04-18 NOTE — Progress Notes (Signed)
Physical Therapy Progress Note  Clinical Impression: Pt is limited by cardiopulmonary status as pt is orthostatic with positional changes. Pt currently modAx2 for bed mobility and sit<>stand transfers. Pt able to maintain static standing balance with minAx2 for 2 minutes before needing to sit secondary to fatigue. Pt requires skilled PT in acute setting to progress bed mobility and transfer training and to improve strength and endurance to safely mobilize in his discharge environment.     04/18/17 1400  PT Visit Information  Last PT Received On 04/18/17  Assistance Needed +2  History of Present Illness Pt admitted with GIB, anemia. Started on HD 04/12/17.  PMH: CKD, chronic anemia, CAD, CABG, mechanical aortic valve, COPD, CHF, DM, afib.   Precautions  Precautions Fall  Restrictions  Weight Bearing Restrictions No  Pain Assessment  Pain Assessment No/denies pain  Cognition  Arousal/Alertness Awake/alert  Behavior During Therapy WFL for tasks assessed/performed  Overall Cognitive Status Difficult to assess  Difficult to assess due to Hard of hearing/deaf  Bed Mobility  Overal bed mobility Needs Assistance  Bed Mobility Rolling;Supine to Sit  Supine to sit +2 for physical assistance;Mod assist  General bed mobility comments mod A for LE management to floor and trunk to upright vc for reaching R UE to bed rail and assist in pulling towards EoB   Transfers  Overall transfer level Needs assistance  Equipment used Rolling walker (2 wheeled)  Transfers Sit to/from [employer] Transfers  Sit to Stand +2 physical assistance;Mod assist;From elevated surface  General transfer comment modAx2 for powerup, pt able to maintain standing for 2 minutes while pericare performed   Ambulation/Gait  Ambulation/Gait assistance (unable due to dizziness )  Balance  Overall balance assessment Needs assistance;History of Falls  Sitting-balance support Bilateral upper extremity supported;Feet supported   Sitting balance-Leahy Scale Fair  Sitting balance - Comments able to maintain once modA for placement of feet on floor  Postural control Posterior lean  Standing balance support Bilateral upper extremity supported  Standing balance-Leahy Scale Poor  Standing balance comment minAx2 for steadying but able to maintain for 2 minutes for pericare  General Comments  General comments (skin integrity, edema, etc.) Pt on 4L O2 via nasal cannula, pt very cold to touch with difficulty attaining SaO2 from multiple measurement devices, BP in supine 102/55, HR 78bpm, in sitting BP 95/44, HR 76 bpm, pt returned to bed before vitals could be taken in standing in order to be taken to procedure    Exercises  Exercises General Lower Extremity  General Exercises - Lower Extremity  Ankle Circles/Pumps AROM;Both;10 reps;Supine  Heel Slides AROM;Both;5 reps;Supine  PT - End of Session  Equipment Utilized During Treatment Gait belt;Oxygen  Activity Tolerance Patient limited by fatigue  Patient left in bed;with nursing/sitter in room;with family/visitor present  Nurse Communication Mobility status  PT - Assessment/Plan  PT Plan Current plan remains appropriate  PT Visit Diagnosis Unsteadiness on feet (R26.81);Dizziness and giddiness (R42);Muscle weakness (generalized) (M62.81)  PT Frequency (ACUTE ONLY) Min 3X/week  Follow Up Recommendations SNF  PT equipment None recommended by PT  AM-PAC PT "6 Clicks" Daily Activity Outcome Measure  Difficulty turning over in bed (including adjusting bedclothes, sheets and blankets)? 1  Difficulty moving from lying on back to sitting on the side of the bed?  1  Difficulty sitting down on and standing up from a chair with arms (e.g., wheelchair, bedside commode, etc,.)? 1  Help needed moving to and from a bed to chair (including a wheelchair)?  2  Help needed walking in hospital room? 1  Help needed climbing 3-5 steps with a railing?  1  6 Click Score 7  Mobility G Code  CM   PT Goal Progression  Progress towards PT goals Not progressing toward goals - comment (limited by cardiovascular response to activity)  Acute Rehab PT Goals  PT Goal Formulation With patient  Time For Goal Achievement 04/27/17  Potential to Achieve Goals Poor  PT Time Calculation  PT Start Time (ACUTE ONLY) 1210  PT Stop Time (ACUTE ONLY) 1228  PT Time Calculation (min) (ACUTE ONLY) 18 min  PT General Charges  $$ ACUTE PT VISIT 1 Procedure  PT Treatments  $Therapeutic Activity 8-22 mins   Noelia Lenart B. Migdalia Dk PT, DPT Acute Rehabilitation  [phone] Pager [phone]

## 2017-04-18 NOTE — Op Note (Signed)
[facility] Patient Name: Robert Stanton Procedure Date : 04/18/2017 MRN: [id number] Attending MD: Milus Banister , MD Date of Birth: 1941-03-04 CSN: [id number] Age: 76 Admit Type: Inpatient Procedure:                Upper GI endoscopy Indications:              Recurrent melena; EGD last week with Dr. 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 Estimated blood loss:                            None. Estimated Blood Loss:     Estimated blood loss: none. Procedure:                Pre-Anesthesia Assessment:                           - Prior to the procedure, a History and Physical                            was performed, and patient medications and                            allergies were reviewed. The patient's tolerance of                            previous anesthesia was also reviewed. The risks                            and benefits of the procedure and the sedation                            options and risks were discussed with the patient.                            All questions were answered, and informed consent                            was obtained. Prior Anticoagulants: The patient has  taken Coumadin (warfarin), last dose was 3 days                            prior to procedure. ASA Grade Assessment: IV - A                            patient with severe systemic disease that is a                            constant threat to life. After reviewing the risks                            and benefits, the patient was deemed in           satisfactory condition to undergo the procedure.                           After obtaining informed consent, the endoscope was                            passed under direct vision. Throughout the                            procedure, the patient's blood pressure, pulse, and                            oxygen saturations were monitored continuously. The                            [id number] ([id number]) scope was introduced through the                            mouth, and advanced to the second part of duodenum.                            The upper GI endoscopy was accomplished without                            difficulty. The patient tolerated the procedure                            well. Scope In: Scope Out: Findings:      The esophagus was normal.      There was no recent or old blood in the UGI tract.      Medium to large hiatal hernia with several small Cameron's type erosions.      The site of ACP ablation in distal stomach was noted, present was a       small clean based ulcer.      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 Biopsies were taken with a       cold forceps for histology.      The examined duodenum was normal. Impression:               -  The site of recent AVM ablation was noted,                            evident by clean based ulcer.                           - Medium sized hiatal hernia present with                            non-bleeding Cameron's erosions.                           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 This was                            biopsied to check for H. pylori, sent to pathology. Moderate Sedation:      none Recommendation:           - Return patient to hospital ward for ongoing care.                            - Advance diet as tolerated.                           - Continue present medications (IV or PO PPI twice                            daily), will also start carafate QID. OK to restart                            heparin IV but hold on coumadin until we have this                            better understood.                           - Await pathology results. Procedure Code(s):        --- Professional ---                           [phone], Esophagogastroduodenoscopy, flexible,                            transoral; with biopsy, single or multiple Diagnosis Code(s):        --- Professional ---                           K29.70, Gastritis, unspecified, without bleeding  K92.1, Melena (includes Hematochezia) CPT copyright 2016 American Medical Association. All rights reserved. The codes documented in this report are preliminary and upon coder review may  be revised to meet current compliance requirements. Milus Banister, MD 04/18/2017 2:11:22 PM This report has been signed electronically. Number of Addenda: 0

## 2017-04-18 NOTE — Anesthesia Preprocedure Evaluation (Addendum)
Anesthesia Evaluation  Patient identified by MRN, date of birth, ID band Patient awake and Patient confused    Reviewed: Allergy & Precautions, NPO status , Patient's Chart, lab work & pertinent test results  Airway Mallampati: III  TM Distance: >3 FB Neck ROM: Full    Dental  (+) Edentulous Upper, Edentulous Lower   Pulmonary sleep apnea and Continuous Positive Airway Pressure Ventilation , COPD (controlled), Current Smoker,    Pulmonary exam normal breath sounds clear to auscultation       Cardiovascular hypertension, Pt. on medications + angina (stable, last episode 3 months ago) with exertion + CAD, + Cardiac Stents (2001), + CABG (2000) and +CHF  Normal cardiovascular exam+ dysrhythmias Atrial Fibrillation and Supra Ventricular Tachycardia  Rhythm:Regular Rate:Normal  ECG: a-fib, rate 57 ECHO: EF 40-45%; LV hypertrophy; LV mild-mod dilated; AV regurg.; LA severely diated, RA mildly dilated Sees Dr. [name], Cardiology.   Neuro/Psych    GI/Hepatic Neg liver ROS, GERD  ,  Endo/Other  Hypothyroidism   Renal/GU CRFRenal disease  negative genitourinary   Musculoskeletal negative musculoskeletal ROS (+)   Abdominal (+) + obese,   Peds negative pediatric ROS (+)  Hematology  (+) Blood dyscrasia, anemia ,   Anesthesia Other Findings Hyperlipidemia Obese Ambulates with cane GI bleed Final result                             *[facility]*                         [address], [place] [address]                            [phone]  ------------------------------------------------------------------- Transthoracic Echocardiography  Patient:    Robert Stanton, Robert Stanton MR #:       [id number] Study Date: 04/14/2017 Gender:     M Age:        76 Height:     172.7 cm Weight:     106.6 kg BSA:        2.3 m^2 Pt. Status: Room:        [place], [place] [id number]  ADMITTING    Denton Brick, Star  PERFORMING   [facility], Inpatient  ORDERING     Patrecia Pour, Edwin  REFERRING    Patrecia Pour, Christean Grief  SONOGRAPHER  [place], [place]  cc:  -------------------------------------------------------------------  ------------------------------------------------------------------- Indications:      CHF - 428.0.  ------------------------------------------------------------------- History:   PMH:  [employer] AVR. Ischemic Cardiomyopathy. Chronic Kidney Disease. Abdominal Aortic Aneurysm.  Murmur.  Coronary artery disease.  Chronic obstructive pulmonary disease.  Risk factors:  Hypertension. Dyslipidemia.  ------------------------------------------------------------------- Study Conclusions  - Left ventricle: Poor acoiustic windows limit study, even with   Definity. LVEF is approximately 35%with hypokinesis worse inf the   inferior and septal walls. The cavity size was severely dilated.   Wall thickness was normal. - Aortic valve: Poor acoustic windows limit study AV prosthesis is   difficult to see Peak and mean gradients through the valve are 35   and 18 mm Hg  respectively. There is trace perivalvular AI. There   was mild regurgitation. Valve area (Vmean): 0.62 cm^2. - Mitral valve: Calcified annulus. Mildly thickened leaflets .   There was mild regurgitation. - Left atrium: The atrium was moderately dilated. - Right ventricle: Systolic function was mildly reduced. - Right atrium: The atrium was severely dilated. - Pulmonary arteries: PA peak pressure: 37 mm Hg (S). - Pericardium, extracardiac: A trivial pericardial effusion was   identified.  ------------------------------------------------------------------- Study data:  Comparison was made to the study of 05/20/2016.  Study status:  Routine.  Procedure:  The patient reported no pain pre or post test. Transthoracic echocardiography. Image quality  was adequate.  Study completion:  There were no complications. Transthoracic echocardiography    Reproductive/Obstetrics negative OB ROS                            Anesthesia Physical  Anesthesia Plan  ASA: IV  Anesthesia Plan: MAC   Post-op Pain Management:    Induction: Intravenous  PONV Risk Score and Plan: 0  Airway Management Planned: Nasal Cannula, Natural Airway and Simple Face Mask  Additional Equipment:   Intra-op Plan:   Post-operative Plan:   Informed Consent: I have reviewed the patients History and Physical, chart, labs and discussed the procedure including the risks, benefits and alternatives for the proposed anesthesia with the patient or authorized representative who has indicated his/her understanding and acceptance.     Plan Discussed with: CRNA and Surgeon  Anesthesia Plan Comments:         Anesthesia Quick Evaluation

## 2017-04-18 NOTE — H&P (View-Only) (Signed)
[facility] Progress Note  Chief Complaint:   Bloody stool   Subjective: Stools bloody this am. No abdominal or rectal pain.   Objective:  Vital signs in last 24 hours: Temp:  [96.4 F (35.8 C)-98.3 F (36.8 C)] 97.9 F (36.6 C) (07/01 0550) Pulse Rate:  [71-81] 77 (07/01 0550) Resp:  [17-22] 21 (07/01 0550) BP: (46-107)/(18-51) 107/31 (07/01 0550) SpO2:  [96 %-99 %] 96 % (07/01 0841) Last BM Date: 04/16/17 General:   Alert, chronically ill appearing white male in NAD EENT:  Diminished hearing. [name]  Heart:  Regular rate and rhythm; anasarca Pulm: Breathing slightly labored. He has 02 per [place]. A few expiratory wheezes at both bases. . Abdomen:  Soft, obese, nontender.  Normal bowel sounds. Abdominal wall edema Rectal:  No external lesions. Maroon stool on DRE.  Neurologic:  Alert and  oriented x4;  grossly normal neurologically. Psych:  Pleasant, cooperative.  Normal mood and affect.    Lab Results:  Recent Labs  04/15/17 0525 04/16/17 0306 04/17/17 0557  WBC 7.4 7.1 8.2  HGB 8.5* 8.6* 8.8*  HCT 25.2* 26.5* 26.7*  PLT 144* 139* 122*   BMET  Recent Labs  04/15/17 0525 04/16/17 0306  NA 134* 135  K 3.9 4.2  CL 93* 96*  CO2 25 25  GLUCOSE 101* 113*  BUN 73* 43*  CREATININE 8.23* 6.06*  CALCIUM 8.5* 8.6*   LFT  Recent Labs  04/16/17 0306  PROT 7.7  ALBUMIN 3.0*  AST 106*  ALT 25  ALKPHOS 73  BILITOT 4.7*   PT/INR  Recent Labs  04/16/17 0306 04/17/17 0557  LABPROT 20.6* 21.9*  INR 1.74 1.89   l    ASSESSMENT / PLAN:   1. 76 yo male with multiple medical problems / deconditioning admitted several days ago with hematemesis (In ED) and rectal bleeding at home in setting of anticoagulation. 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 Patient has been on heparin and coumadin restarted  yesterday. INR 1.89 today. Hgb stable but not checked since started bleeding this am.  -Recurrent bleeding may be from the gastric AVM. He is not NPO. Will plan for repeat EGD tomorrow. The risks and benefits of EGD were discussed and the patient agrees to proceed.  -clear liquids, NPO after midnight.  -IV BID PPI -check CBC now and monitor from there.   2. 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 AFIB / AVR on chronic coumadin. Because of bleed coumadin held. Has been on heparin and coumadin just restarted. Now with recurrent bleeding.  -Hold coumadin, he is actively bleeding  2. ? Cirrhosis per CT scan. Can evaluate outpatient once acute problems resolve.    3. CKD 4, progressed to ESRD. Requiring HD. Significantly volume overloaded  .   LOS: 7 days   Tye Savoy ,NP 04/17/2017, 12:04 PM  Pager number [phone]    [facility] Attending   I have taken an interval history, reviewed the chart and examined the patient. I agree with the Advanced Practitioner's note, impression and recommendations.    Recurrent bleeding - ? From AVM that was Tx Could be lower bleed but will start w/ repeat EGD tomorrow - sooner if needed  The risks and benefits as well as alternatives of endoscopic procedure(s) have been discussed and reviewed. All questions answered. The patient agrees  to proceed.  Gatha Mayer, MD, [facility] [phone] (pager) 04/17/2017 1:51 PM

## 2017-04-18 NOTE — Transfer of Care (Addendum)
Immediate Anesthesia Transfer of Care Note  Patient: Robert Stanton  Procedure(s) Performed: Procedure(s): ESOPHAGOGASTRODUODENOSCOPY (EGD) (N/A)  Patient Location: Endoscopy Unit  Anesthesia Type:MAC  Level of Consciousness: sedated and patient cooperative  Airway & Oxygen Therapy: Patient Spontanous Breathing, [place] O@  Post-op Assessment: Report given to RN and Post -op Vital signs reviewed and stable  Post vital signs: pt hypotensive, treated and Dr. Jillyn Hidden awate  Last Vitals:  Vitals:   04/18/17 1247 04/18/17 1318  BP: (!) 151/118 (!) 113/44  Pulse: 71   Resp: 17   Temp: 36.4 C     Last Pain:  Vitals:   04/18/17 1247  TempSrc: Oral  PainSc:          Complications: No apparent anesthesia complications

## 2017-04-18 NOTE — Progress Notes (Signed)
Patient ID: Robert Stanton, male   DOB: [birth year], 76 y.o.   MRN: [id number] S: s/p EGD with evidence of pan gastritis O:BP (!) 109/51   Pulse 72   Temp 97.7 F (36.5 C) (Oral)   Resp 16   Ht 5\' 8"  (1.727 m)   Wt 106.2 kg (234 lb 2.1 oz)   SpO2 97%   BMI 35.60 kg/m   Intake/Output Summary (Last 24 hours) at 04/18/17 1539 Last data filed at 04/18/17 1404  Gross per 24 hour  Intake              200 ml  Output             1842 ml  Net            -1642 ml   Intake/Output: I/O last 3 completed shifts: In: 178 [P.O.:178] Out: 1827 [Other:1827]  Intake/Output this shift:  Total I/O In: 200 [I.V.:200] Out: 15 [Blood:15] Weight change:  Gen: somnolent, NAD CVS:no rub Resp:cta FFM:BWGYKZ Ext:1+ edema, LUE AVF +T/B   Recent Labs Lab 04/12/17 0154 04/13/17 9935 04/13/17 0715 04/14/17 7017 04/15/17 0525 04/16/17 0306 04/17/17 1248 04/18/17 0742  NA 132*  --  133* 131* 134* 135 135 133*  K 4.0  --  4.3 4.3 3.9 4.2 4.9 4.6  CL 93*  --  92* 93* 93* 96* 95* 94*  CO2 21*  --  22 21* 25 25 25 25   GLUCOSE 129*  --  118* 107* 101* 113* 115* 89  BUN 144*  --  155* 111* 73* 43* 68* 40*  CREATININE 10.95*  --  11.80* 10.21* 8.23* 6.06* 8.22* 5.35*  ALBUMIN 2.9*  --  2.9* 2.9* 3.0* 3.0* 3.0* 3.1*  CALCIUM 8.4*  --  8.0* 8.3* 8.5* 8.6* 9.0 8.6*  PHOS 7.0*  --  8.2*  --   --   --   --   --   AST  --   --   --  126* 105* 106* 99* 105*  ALT  --  51  --  39 28 25 24 26    Liver Function Tests:  Recent Labs Lab 04/16/17 0306 04/17/17 1248 04/18/17 0742  AST 106* 99* 105*  ALT 25 24 26   ALKPHOS 73 67 67  BILITOT 4.7* 5.0* 5.8*  PROT 7.7 7.7 7.2  ALBUMIN 3.0* 3.0* 3.1*   No results for input(s): LIPASE, AMYLASE in the last 168 hours. No results for input(s): AMMONIA in the last 168 hours. CBC:  Recent Labs Lab 04/17/17 0557 04/17/17 1248 04/17/17 2006 04/18/17 0042 04/18/17 0742  WBC 8.2 8.3 8.4 8.8 9.2  HGB 8.8* 8.6* 8.8* 8.8* 8.7*  HCT 26.7* 26.7* 26.3* 25.8*  26.8*  MCV 98.5 98.2 97.8 97.4 100.0  PLT 122* 128* 116* 100* 107*   Cardiac Enzymes: No results for input(s): CKTOTAL, CKMB, CKMBINDEX, TROPONINI in the last 168 hours. CBG: No results for input(s): GLUCAP in the last 168 hours.  Iron Studies: No results for input(s): IRON, TIBC, TRANSFERRIN, FERRITIN in the last 72 hours. Studies/Results: No results found. [name] albuterol  2.5 mg Nebulization TID  . calcium acetate  1,334 mg Oral TID WC  . guaiFENesin  600 mg Oral BID  . levothyroxine  50 mcg Oral QAC breakfast  . midodrine  5 mg Oral BID WC  . montelukast  10 mg Oral QHS  . multivitamin  1 tablet Oral QHS  . pantoprazole (PROTONIX) IV  40 mg Intravenous Q12H    BMET  Component Value Date/Time   NA 133 (L) 04/18/2017 0742   K 4.6 04/18/2017 0742   CL 94 (L) 04/18/2017 0742   CO2 25 04/18/2017 0742   GLUCOSE 89 04/18/2017 0742   BUN 40 (H) 04/18/2017 0742   CREATININE 5.35 (H) 04/18/2017 0742   CREATININE 6.57 (H) 04/05/2017 1554   CALCIUM 8.6 (L) 04/18/2017 0742   GFRNONAA 9 (L) 04/18/2017 0742   GFRNONAA 8 (L) 04/05/2017 1554   GFRAA 11 (L) 04/18/2017 0742   GFRAA 9 (L) 04/05/2017 1554   CBC    Component Value Date/Time   WBC 9.2 04/18/2017 0742   RBC 2.68 (L) 04/18/2017 0742   HGB 8.7 (L) 04/18/2017 0742   HCT 26.8 (L) 04/18/2017 0742   PLT 107 (L) 04/18/2017 0742   MCV 100.0 04/18/2017 0742   MCH 32.5 04/18/2017 0742   MCHC 32.5 04/18/2017 0742   RDW 23.9 (H) 04/18/2017 0742   LYMPHSABS 915 04/05/2017 1554   MONOABS 732 04/05/2017 1554   EOSABS 183 04/05/2017 1554   BASOSABS 0 04/05/2017 1554     Assessment/Plan:  1. AKI/CKD stage 4 at baseline now at ESRD.  Continue with HD qTTS and is set up for [facility] once stable for discharge 2. UGI bleed- appreciate GI input, pan gastritis 3. CHF/CMP- UF as BP tolerates 4. Anemia of CKD/ABLA- due to GIB, start aranesp with HD 5. Gastric AVMs 6. Vascular access- LUE AVF +T/B, placed 03/04/17 7. Deconditioning- per  primary 8. A fib 9. S/p AVR 10. OSA on CPAP  Donetta Potts, MD [employer] [phone]

## 2017-04-18 NOTE — Progress Notes (Signed)
Occupational Therapy Treatment Patient Details Name: Robert Stanton MRN: [id number] DOB: [birth year] Today's Date: 04/18/2017    History of present illness Pt admitted with GIB, anemia. Started on HD 04/12/17.  PMH: CKD, chronic anemia, CAD, CABG, mechanical aortic valve, COPD, CHF, DM, afib.    OT comments  Pt reports dizziness upon sitting BP 96/43 (MAP 60) pt with BIL LE movement BP 108/44 ( MAP 65). Pt noted to have edema throughout bil UE with weeping. Pt unaware of medication reason for admission at this time but is aware of HD. Pt tolerated EOB ~15 minutes this session static sitting.    Follow Up Recommendations  SNF;Supervision/Assistance - 24 hour    Equipment Recommendations  3 in 1 bedside commode;Wheelchair (measurements OT);Wheelchair cushion (measurements OT);Hospital bed;Other (comment) (air mattress overlay)    Recommendations for Other Services      Precautions / Restrictions Precautions Precautions: Fall Precaution Comments: pitting edema 4+ throughout trunk arms legs hands (noted to have weeping from bil Arms) Restrictions Weight Bearing Restrictions: No       Mobility Bed Mobility Overal bed mobility: Needs Assistance Bed Mobility: Rolling;Supine to Sit Rolling: Max assist   Supine to sit: +2 for physical assistance;Mod assist     General bed mobility comments: Pt turning head and reaching with R UE to command with R knee in flexion. pt unable to power onto side or elevate trunk. pt once eob requires total +2 (A) to position with bil LE on floor. pt static sitting min guard at this time with bil Ue support  Transfers                 General transfer comment: pt was able to complete knee extension (less than full rom) and hip flexion sitting. pt reports "i can't stand without a lot of help"     Balance                                           ADL either performed or assessed with clinical judgement   ADL Overall ADL's :  Needs assistance/impaired     Grooming: Wash/dry hands;Wash/dry face;Moderate assistance;Sitting               Lower Body Dressing: Total assistance                 General ADL Comments: pt supine on arrival and session completed EOB sitting ~15 minutes. pt noted to have edema throughout the body 4+ pitting. pt with weeping from bil Arms and unaware.      Vision       Perception     Praxis      Cognition Arousal/Alertness: Awake/alert Behavior During Therapy: WFL for tasks assessed/performed Overall Cognitive Status: Difficult to assess                                          Exercises     Shoulder Instructions       General Comments noted to have large bruise over R eye. pt reports falling on the porch over a week ago and required x2 sons to help elevate back up. Pt reports only 1 fall in past month. R heel demonstrates skin break down so heel protector applied. pt with bil socks don to help  with warmth ( reporting feeling cold" BP 96/43 and 108/44 at EOB    Pertinent Vitals/ Pain       Pain Assessment: No/denies pain  Home Living                                          Prior Functioning/Environment              Frequency  Min 2X/week        Progress Toward Goals  OT Goals(current goals can now be found in the care plan section)  Progress towards OT goals: Progressing toward goals  Acute Rehab OT Goals Patient Stated Goal: did not state OT Goal Formulation: With patient Time For Goal Achievement: 04/27/17 Potential to Achieve Goals: Good ADL Goals Pt Will Perform Grooming: with set-up;sitting Pt Will Perform Upper Body Dressing: with min assist;sitting Pt Will Perform Lower Body Dressing: with mod assist;with adaptive equipment;sit to/from stand Pt Will Transfer to Toilet: with min assist;ambulating;bedside commode Pt Will Perform Toileting - Clothing Manipulation and hygiene: with min assist;sit  to/from stand Additional ADL Goal #1: Pt will perform bed mobility with moderate assistance. Additional ADL Goal #2: Pt will utilize energy conservation strategies during ADL and mobility with min verbal cues.  Plan Discharge plan remains appropriate    Co-evaluation                 AM-PAC PT "6 Clicks" Daily Activity     Outcome Measure   Help from another person eating meals?: A Little Help from another person taking care of personal grooming?: A Lot Help from another person toileting, which includes using toliet, bedpan, or urinal?: Total Help from another person bathing (including washing, rinsing, drying)?: A Lot Help from another person to put on and taking off regular upper body clothing?: A Lot Help from another person to put on and taking off regular lower body clothing?: Total 6 Click Score: 11    End of Session Equipment Utilized During Treatment: Oxygen (3 L)  OT Visit Diagnosis: Unsteadiness on feet (R26.81);History of falling (Z91.81);Muscle weakness (generalized) (M62.81)   Activity Tolerance Patient tolerated treatment well   Patient Left in bed;with call bell/phone within reach;with bed alarm set   Nurse Communication Mobility status;Precautions        Time: 2841-3244 OT Time Calculation (min): 21 min  Charges: OT General Charges $OT Visit: 1 Procedure OT Treatments $Therapeutic Activity: 8-22 mins   Jeri Modena   OTR/L Pager: [phone] Office: [phone] .    Parke Poisson B 04/18/2017, 10:56 AM

## 2017-04-18 NOTE — Anesthesia Postprocedure Evaluation (Signed)
Anesthesia Post Note  Patient: Robert Stanton  Procedure(s) Performed: Procedure(s) (LRB): ESOPHAGOGASTRODUODENOSCOPY (EGD) (N/A)     Patient location during evaluation: PACU Anesthesia Type: MAC Level of consciousness: awake Pain management: pain level controlled Vital Signs Assessment: post-procedure vital signs reviewed and stable Respiratory status: spontaneous breathing Cardiovascular status: blood pressure returned to baseline Postop Assessment: no signs of nausea or vomiting Anesthetic complications: no    Last Vitals:  Vitals:   04/18/17 1445 04/18/17 1450  BP: (!) 112/42 (!) 103/44  Pulse: 71 76  Resp: 16 18  Temp:      Last Pain:  Vitals:   04/18/17 1410  TempSrc: Oral  PainSc:    Pain Goal:                 Tyshon Fanning JR,JOHN [employer]

## 2017-04-18 NOTE — Interval H&P Note (Signed)
History and Physical Interval Note:  04/18/2017 1:10 PM  Robert Stanton  has presented today for surgery, with the diagnosis of Gastrointestinal bleeding  The various methods of treatment have been discussed with the patient and family. After consideration of risks, benefits and other options for treatment, the patient has consented to  Procedure(s): ESOPHAGOGASTRODUODENOSCOPY (EGD) (N/A) as a surgical intervention .  The patient's history has been reviewed, patient examined, no change in status, stable for surgery.  I have reviewed the patient's chart and labs.  Questions were answered to the patient's satisfaction.     Milus Banister

## 2017-04-19 DIAGNOSIS — R05 Cough: Secondary | ICD-10-CM

## 2017-04-19 DIAGNOSIS — I420 Dilated cardiomyopathy: Secondary | ICD-10-CM

## 2017-04-19 LAB — CBC
HCT: 25.7 % — ABNORMAL LOW (ref 39.0–52.0)
Hemoglobin: 8.6 g/dL — ABNORMAL LOW (ref 13.0–17.0)
MCH: 33.2 pg (ref 26.0–34.0)
MCHC: 33.5 g/dL (ref 30.0–36.0)
MCV: 99.2 fL (ref 78.0–100.0)
PLATELETS: 102 10*3/uL — AB (ref 150–400)
RBC: 2.59 MIL/uL — ABNORMAL LOW (ref 4.22–5.81)
RDW: 24.6 % — AB (ref 11.5–15.5)
WBC: 9.4 10*3/uL (ref 4.0–10.5)

## 2017-04-19 LAB — HEPATIC FUNCTION PANEL
ALBUMIN: 2.9 g/dL — AB (ref 3.5–5.0)
ALT: 28 U/L (ref 17–63)
AST: 113 U/L — AB (ref 15–41)
Alkaline Phosphatase: 66 U/L (ref 38–126)
BILIRUBIN DIRECT: 3.5 mg/dL — AB (ref 0.1–0.5)
Indirect Bilirubin: 3 mg/dL — ABNORMAL HIGH (ref 0.3–0.9)
Total Bilirubin: 6.5 mg/dL — ABNORMAL HIGH (ref 0.3–1.2)
Total Protein: 7.4 g/dL (ref 6.5–8.1)

## 2017-04-19 LAB — HEMOGLOBIN AND HEMATOCRIT, BLOOD
HCT: 26.4 % — ABNORMAL LOW (ref 39.0–52.0)
Hemoglobin: 8.8 g/dL — ABNORMAL LOW (ref 13.0–17.0)

## 2017-04-19 LAB — RENAL FUNCTION PANEL
ANION GAP: 12 (ref 5–15)
Albumin: 3 g/dL — ABNORMAL LOW (ref 3.5–5.0)
BUN: 54 mg/dL — ABNORMAL HIGH (ref 6–20)
CHLORIDE: 95 mmol/L — AB (ref 101–111)
CO2: 25 mmol/L (ref 22–32)
Calcium: 8.9 mg/dL (ref 8.9–10.3)
Creatinine, Ser: 6.33 mg/dL — ABNORMAL HIGH (ref 0.61–1.24)
GFR calc Af Amer: 9 mL/min — ABNORMAL LOW (ref >60)
GFR calc non Af Amer: 8 mL/min — ABNORMAL LOW (ref >60)
GLUCOSE: 105 mg/dL — AB (ref 65–99)
POTASSIUM: 4.7 mmol/L (ref 3.5–5.1)
Phosphorus: 7.2 mg/dL — ABNORMAL HIGH (ref 2.5–4.6)
Sodium: 132 mmol/L — ABNORMAL LOW (ref 135–145)

## 2017-04-19 LAB — PROTIME-INR
INR: 2.57
INR: 2.52
PROTHROMBIN TIME: 27.7 s — AB (ref 11.4–15.2)
Prothrombin Time: 28.1 seconds — ABNORMAL HIGH (ref 11.4–15.2)

## 2017-04-19 LAB — HEPARIN LEVEL (UNFRACTIONATED): Heparin Unfractionated: 0.17 IU/mL — ABNORMAL LOW (ref 0.30–0.70)

## 2017-04-19 MED ORDER — DARBEPOETIN ALFA 60 MCG/0.3ML IJ SOSY
PREFILLED_SYRINGE | INTRAMUSCULAR | Status: AC
  Filled 2017-04-19: qty 0.3

## 2017-04-19 MED ORDER — MIDODRINE HCL 5 MG PO TABS
ORAL_TABLET | ORAL | Status: AC
  Administered 2017-04-19: 5 mg via ORAL
  Filled 2017-04-19: qty 1

## 2017-04-19 NOTE — Progress Notes (Signed)
ANTICOAGULATION CONSULT NOTE - Follow Up Consult  Pharmacy Consult for Heparin Indication: atrial fibrillation and mechanical atrial valve  Patient Measurements: Height: 5\' 8"  (172.7 cm) Weight:  (unable to obtain on bed scale) IBW/kg (Calculated) : 68.4 Heparin Dosing Weight: 93 kg  Vital Signs: Temp: 97.6 F (36.4 C) (07/02 2255) Temp Source: Oral (07/02 2255) BP: 100/41 (07/02 2300) Pulse Rate: 74 (07/02 2300)  Labs:  Recent Labs  04/16/17 0306 04/17/17 0557 04/17/17 1248  04/18/17 0042 04/18/17 0742 04/19/17 0045  HGB 8.6* 8.8* 8.6*  < > 8.8* 8.7* 8.6*  HCT 26.5* 26.7* 26.7*  < > 25.8* 26.8* 25.7*  PLT 139* 122* 128*  < > 100* 107* 102*  LABPROT 20.6* 21.9*  --   --   --  21.6* 28.1*  INR 1.74 1.89  --   --   --  1.85 2.57  HEPARINUNFRC 0.35 0.26*  --   --   --   --  0.17*  CREATININE 6.06*  --  8.22*  --   --  5.35* 6.33*  < > = values in this interval not displayed.  Estimated Creatinine Clearance: 12.1 mL/min (A) (by C-G formula based on SCr of 6.33 mg/dL (H)).  Assessment:  76 yo M admitted 6/24 with rectal bleeding.  Pt was on Coumadin PTA for hx mechanical AVR and afib.  Coumadin was held and reversed with Vitamin K and FFP in the setting of acute GIB and supratherapeutic INR. Bleeding gastric AVM successfully treated. Noted large hiatal hernia with [name] erosions. GI notes expect chronic low-grade oozing over time. Protonix 40 mg daily was to continue indefinitely, as well as oral iron BID.  Currently off oral iron -> day #5 of 5 IV iron.  Off ASA 81 mg. Also off Amiodarone, which may also effect Coumadin requirements.    Anticoagulation with heparin >> Coumadin was restarted 6/27, then held on 6/30 due to bloody stools.  Now s/p EGD, to resume heparin but not Coumadin yet. Hgb low stable, platelet count has trended down to 107.   RN reports smear of blood with BM today.    Had been going slowly with Coumadin dosing and using low heparin level range with  recent bleeding. Levels have been 0.26-0.35.    Received Coumadin 2.5 mg daily x 4 days, last dose 6/30.  Protonix is now BID and Carafate q6hrs has been added.     T bili trended up to 5.8,  AST 105, same. Off Amiodarone since admitted.     Most recent Coumadin regimen:  2.5 mg daily except none on Mondays and Thursdays.  Last outpatient INR was 3.3 on 04/05/17 on 2.5 mg daily. Regimen adjusted that day to skip Coumadin 2 days a week. Confirmed with Dr. Idell Pickles office.    Heparin level is 0.17 after restarting heparin gtt on 7/2 pm. INR now 2.57. CBC low but stable.   Goal of Therapy: Heparin level 0.3-0.5 units/ml (with recent bleed) Monitor platelets by anticoagulation protocol: Yes   Plan:  1.  Increase heparin drip to slightly to 1350 units/hr 2.  Heparin level ~8 hrs 3.  Daily heparin level, PT/INR and CB 4.  Monitor for any bleeding    Vincenza Hews, PharmD, BCPS 04/19/2017, 2:59 AM

## 2017-04-19 NOTE — Progress Notes (Signed)
Patient ID: Robert Stanton, male   DOB: 12-13-1940, 76 y.o.   MRN: [id number] S:Feels weak O:BP (!) 112/53 (BP Location: Right Arm)   Pulse 72   Temp 97.7 F (36.5 C) (Oral)   Resp 20   Ht 5\' 8"  (1.727 m)   Wt 106.2 kg (234 lb 2.1 oz)   SpO2 99%   BMI 35.60 kg/m   Intake/Output Summary (Last 24 hours) at 04/19/17 0951 Last data filed at 04/19/17 0530  Gross per 24 hour  Intake           700.49 ml  Output               15 ml  Net           685.49 ml   Intake/Output: I/O last 3 completed shifts: In: 810.5 [P.O.:240; I.V.:350.5; IV Piggyback:220] Out: 2440 [Other:1827; Blood:15]  Intake/Output this shift:  No intake/output data recorded. Weight change:  Gen: frail elderly WM CVS:no rub, +metallic click Resp:cta NUU:VOZDGU Ext:1+ edema, LUE AVF +T/B   Recent Labs Lab 04/13/17 0714 04/13/17 0715 04/14/17 4403 04/15/17 0525 04/16/17 0306 04/17/17 1248 04/18/17 0742 04/19/17 0045  NA  --  133* 131* 134* 135 135 133* 132*  K  --  4.3 4.3 3.9 4.2 4.9 4.6 4.7  CL  --  92* 93* 93* 96* 95* 94* 95*  CO2  --  22 21* 25 25 25 25 25   GLUCOSE  --  118* 107* 101* 113* 115* 89 105*  BUN  --  155* 111* 73* 43* 68* 40* 54*  CREATININE  --  11.80* 10.21* 8.23* 6.06* 8.22* 5.35* 6.33*  ALBUMIN  --  2.9* 2.9* 3.0* 3.0* 3.0* 3.1* 3.0*  CALCIUM  --  8.0* 8.3* 8.5* 8.6* 9.0 8.6* 8.9  PHOS  --  8.2*  --   --   --   --   --  7.2*  AST  --   --  126* 105* 106* 99* 105*  --   ALT 51  --  39 28 25 24 26   --    Liver Function Tests:  Recent Labs Lab 04/16/17 0306 04/17/17 1248 04/18/17 0742 04/19/17 0045  AST 106* 99* 105*  --   ALT 25 24 26   --   ALKPHOS 73 67 67  --   BILITOT 4.7* 5.0* 5.8*  --   PROT 7.7 7.7 7.2  --   ALBUMIN 3.0* 3.0* 3.1* 3.0*   No results for input(s): LIPASE, AMYLASE in the last 168 hours. No results for input(s): AMMONIA in the last 168 hours. CBC:  Recent Labs Lab 04/17/17 1248 04/17/17 2006 04/18/17 0042 04/18/17 0742 04/19/17 0045  04/19/17 0729  WBC 8.3 8.4 8.8 9.2 9.4  --   HGB 8.6* 8.8* 8.8* 8.7* 8.6* 8.8*  HCT 26.7* 26.3* 25.8* 26.8* 25.7* 26.4*  MCV 98.2 97.8 97.4 100.0 99.2  --   PLT 128* 116* 100* 107* 102*  --    Cardiac Enzymes: No results for input(s): CKTOTAL, CKMB, CKMBINDEX, TROPONINI in the last 168 hours. CBG: No results for input(s): GLUCAP in the last 168 hours.  Iron Studies: No results for input(s): IRON, TIBC, TRANSFERRIN, FERRITIN in the last 72 hours. Studies/Results: No results found. [name] albuterol  2.5 mg Nebulization TID  . calcium acetate  1,334 mg Oral TID WC  . darbepoetin (ARANESP) injection - DIALYSIS  60 mcg Intravenous Q Tue-HD  . levothyroxine  50 mcg Oral QAC breakfast  . midodrine  5 mg Oral BID WC  . montelukast  10 mg Oral QHS  . multivitamin  1 tablet Oral QHS  . pantoprazole (PROTONIX) IV  40 mg Intravenous Q12H  . sucralfate  1 g Oral Q6H    BMET    Component Value Date/Time   NA 132 (L) 04/19/2017 0045   K 4.7 04/19/2017 0045   CL 95 (L) 04/19/2017 0045   CO2 25 04/19/2017 0045   GLUCOSE 105 (H) 04/19/2017 0045   BUN 54 (H) 04/19/2017 0045   CREATININE 6.33 (H) 04/19/2017 0045   CREATININE 6.57 (H) 04/05/2017 1554   CALCIUM 8.9 04/19/2017 0045   GFRNONAA 8 (L) 04/19/2017 0045   GFRNONAA 8 (L) 04/05/2017 1554   GFRAA 9 (L) 04/19/2017 0045   GFRAA 9 (L) 04/05/2017 1554   CBC    Component Value Date/Time   WBC 9.4 04/19/2017 0045   RBC 2.59 (L) 04/19/2017 0045   HGB 8.8 (L) 04/19/2017 0729   HCT 26.4 (L) 04/19/2017 0729   PLT 102 (L) 04/19/2017 0045   MCV 99.2 04/19/2017 0045   MCH 33.2 04/19/2017 0045   MCHC 33.5 04/19/2017 0045   RDW 24.6 (H) 04/19/2017 0045   LYMPHSABS 915 04/05/2017 1554   MONOABS 732 04/05/2017 1554   EOSABS 183 04/05/2017 1554   BASOSABS 0 04/05/2017 1554     Assessment/Plan:  1. New ESRD.  Continue with HD qTTS and is set up for [facility] once stable for discharge, unfortunately he is not strong enough for outpatient  dialysis at this time.  He will need to be able to stand and transfer to a recliner for dialysis. 2. UGI bleed- appreciate GI input, pan gastritis 3. CHF/CMP- UF as BP tolerates 4. Anemia of CKD/ABLA- due to GIB, start aranesp with HD 5. Gastric AVMs 6. Vascular access- LUE AVF +T/B, placed 03/04/17 7. Deconditioning- per primary 8. A fib 9. S/p AVR 10. OSA on CPAP 11. Disposition- he is not ready for discharge for outpatient dialysis due to his severe deconditioning.   He will need to be able to stand and transfer to recliner for dialysis before he can be discharged.    Donetta Potts, MD [employer] [phone]

## 2017-04-19 NOTE — Progress Notes (Signed)
ANTICOAGULATION CONSULT NOTE - Follow Up Consult  Pharmacy Consult for Heparin Indication: atrial fibrillation and mechanical atrial valve  Patient Measurements: Height: 5\' 8"  (172.7 cm) Weight:  (unable to obtain on bed scale) IBW/kg (Calculated) : 68.4 Heparin Dosing Weight: 93 kg  Vital Signs: Temp: 97.7 F (36.5 C) (07/03 0757) Temp Source: Oral (07/03 0757) BP: 112/53 (07/03 0757) Pulse Rate: 72 (07/03 0757)  Labs:  Recent Labs  04/17/17 0557 04/17/17 1248  04/18/17 0042 04/18/17 0742 04/19/17 0045 04/19/17 0729  HGB 8.8* 8.6*  < > 8.8* 8.7* 8.6* 8.8*  HCT 26.7* 26.7*  < > 25.8* 26.8* 25.7* 26.4*  PLT 122* 128*  < > 100* 107* 102*  --   LABPROT 21.9*  --   --   --  21.6* 28.1*  --   INR 1.89  --   --   --  1.85 2.57  --   HEPARINUNFRC 0.26*  --   --   --   --  0.17*  --   CREATININE  --  8.22*  --   --  5.35* 6.33*  --   < > = values in this interval not displayed.  Estimated Creatinine Clearance: 12.1 mL/min (A) (by C-G formula based on SCr of 6.33 mg/dL (H)).  Assessment: CC/HPI: 76 yo M admitted 6/24 with rectal bleeding.    PMH: HTN, CAD s/p CABG 2010, afib, CKD5, esophageal stricture, gout, COPD, OSA, mechanical AVR, hypothyroidism, PAD, DM  Anticoag: warfarin pta dt [employer] AVR (2002) and afib.   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 Bp issues are interfering with HD Last session 7/1  Heme/Onc: H&H 8.8/26.4, Plt 102  Goal of Therapy: Heparin level 0.3 - 0.5 units/ml INR 2.5-3  Monitor platelets by anticoagulation protocol: Yes   Plan:  DC Heparin and resume when INR < 2.5 per Cards Hold warfarin per GI INR q12h (to be safe with valve and until warfarin resumed), CBC daily Monitor s/sx of bleeding  Levester Fresh, PharmD,  BCPS, BCCCP Clinical Pharmacist Clinical phone for 04/19/2017 from 7a-3:30p: [phone] If after 3:30p, please call main pharmacy at: [phone] 04/19/2017 9:18 AM

## 2017-04-19 NOTE — Progress Notes (Signed)
[facility] Progress Note    Since last GI note: EGD yesterday; ulcer at site of recent AVM ablation, moderate to severe pan-gastritis; overall very friable gastric mucosa. Biopsies taken to check for H. Pylori.  This morning he was SOB trying to sit up in bed to eat.  Bedside CMA reports only small "mixed dark" BM overnight.  Objective: Vital signs in last 24 hours: Temp:  [97.4 F (36.3 C)-97.9 F (36.6 C)] 97.7 F (36.5 C) (07/03 0757) Pulse Rate:  [63-77] 72 (07/03 0757) Resp:  [11-23] 20 (07/03 0757) BP: (85-151)/(24-118) 112/53 (07/03 0757) SpO2:  [92 %-100 %] 99 % (07/03 0757) Last BM Date: 04/19/17 General: alert and oriented times 3; he is weak, very frail Heart: regular rate and rythm Abdomen: soft, non-tender, non-distended, normal bowel sounds   Lab Results:  Recent Labs  04/18/17 0042 04/18/17 0742 04/19/17 0045 04/19/17 0729  WBC 8.8 9.2 9.4  --   HGB 8.8* 8.7* 8.6* 8.8*  PLT 100* 107* 102*  --   MCV 97.4 100.0 99.2  --     Recent Labs  04/17/17 1248 04/18/17 0742 04/19/17 0045  NA 135 133* 132*  K 4.9 4.6 4.7  CL 95* 94* 95*  CO2 25 25 25   GLUCOSE 115* 89 105*  BUN 68* 40* 54*  CREATININE 8.22* 5.35* 6.33*  CALCIUM 9.0 8.6* 8.9    Recent Labs  04/17/17 1248 04/18/17 0742 04/19/17 0045  PROT 7.7 7.2  --   ALBUMIN 3.0* 3.1* 3.0*  AST 99* 105*  --   ALT 24 26  --   ALKPHOS 67 67  --   BILITOT 5.0* 5.8*  --     Recent Labs  04/18/17 0742 04/19/17 0045  INR 1.85 2.57    Medications: Scheduled Meds: . albuterol  2.5 mg Nebulization TID  . calcium acetate  1,334 mg Oral TID WC  . darbepoetin (ARANESP) injection - DIALYSIS  60 mcg Intravenous Q Tue-HD  . levothyroxine  50 mcg Oral QAC breakfast  . midodrine  5 mg Oral BID WC  . montelukast  10 mg Oral QHS  . multivitamin  1 tablet Oral QHS  . pantoprazole (PROTONIX) IV  40 mg Intravenous Q12H  . sucralfate  1 g Oral Q6H   Continuous Infusions: . ferric  gluconate (FERRLECIT/NULECIT) IV Stopped (04/18/17 1646)   PRN Meds:.ipratropium-albuterol, ondansetron (ZOFRAN) IV    Assessment/Plan: 76 y.o. male with CHF, ESRD on HD, with mechanical valve and afib that require blood thinner chronically; melena  Gastric AVM site from last week was noted in EGD yesterday, ulcerated but that is to be expected as it heals. He has moderate to severe pan-gastritis and the mucosa of his stomach was very friable (no doubt at least partly from his blood thinners), biopsies taken to check for H. Pylori.  If that is + will treat with appropriate antibiotics.  If that is negative should continue BID PPI and QID carafate.  He will be at risk for recurrent bleeding when blood thinners are restarted.  Overall he is very frail, weak with mounting comorbid conditions and I agree with palliative care that it is important that he have a discussion about overall goals of care here.  Will continue to follow.  OK to resume IV heparin but would hold on longer acting blood thinners for now.    Milus Banister, MD  04/19/2017, 8:49 AM [facility] Pager [phone]

## 2017-04-19 NOTE — Progress Notes (Signed)
PROGRESS NOTE    Robert Stanton  EUM:[id number] DOB: 04/24/1941 DOA: 04/10/2017 PCP: Robert Pinto, MD   Chief Complaint  Patient presents with  . Nausea  . Dizziness    Brief Narrative:  HPI on 04/10/2017 by Dr. Jenetta Downer Robert Stanton is a 76 y.o. male with medical history significant for- hypertension, coronary artery disease- CABG- 2010,  atrial fibrillation CKD5, hypertension, esophageal strictures, gout, COPD, OSA, mechanical aortic valve, hypothyroidism, peripheral artery disease, type II DM. Patient came in with complaints of bleeding from his rectum was started about 3 days ago, he also reports dark stools- 3-4 days. He denies abdominal pain. In the ED he had an episode of vomiting- with coffee-ground emesis.  He endorses dizziness that started Saturday morning- 04/09/17. This appears to be a chronic issue for patient. Pt denies NSAID use. Patient reports a history of peptic ulcer disease diagnosed with EGD- several years ago. Last EGD 10/2012- was esophageal stricture and erosions, erosive gastritis. Last colonoscopy was about 10 years ago- reports it was normal. He also notes a chronic cough that is productive today, though he has a history of COPD. His shortness of breath is at baseline. He denies fever or chills.  Interim history Patient was found to have heme positive stools, with increasing creatinine and elevated INR. He was admitted for evaluation of GI bleeding. Patient underwent EGD showing AVMs status post hemostatic endoscopy with APC. Nephrology also consulted and hemodialysis initiated. Patient started to have GIBleed again, anticoag held. S/p EGD on 7/2.   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 Esophageal stricture without esophagitis. Moderately large hiatal hernia with  [name] erosions. -Continue iron supplementation 325mg  BID -Overnight, patient supposedly had several bloody bowel movements, however hemoglobin currently 8.8 (higher than the past few days) -Status post EGD 04/18/2017 showing nonbleeding Cameron erosions, most notable for moderate to severe pain and gastritis resulting in very friable gastric mucosa. Pathology sent. Patient started on Carafate, continue PPI twice daily. GI felt heparin IV could be restarted however Coumadin to be held. -Heparin restarted yesterday, 7/2, INR rose to 2.57 this morning. -Overnight, patient did have another bloody bowel movement. Hemoglobin currently stable at 8.6 -Pending further recommendations from cardiology as well as GI regarding anticoagulation. -Heparin being monitored by pharmacy  Acute renal failure on chronic kidney disease, stage IV with oliguria -Creatinine continued to worsen, peaked at 11.8 -Nephrology consult and appreciated, hemodialysis initiated -Patient was given IV diuresis however remained oliguric with fluid overload -Interventional radiology consulted and appreciated for HD catheter placement -Of note, patient did have aVF on left upper extremity 03/04/2017 -Placed on phoslo, renavit -Discussed midodrine with nephrology, continue 5mg  BID -Patient does have outpatient dialysis slot. However not stable for discharge at this time -Continues to be very volume overloaded  Acute on Chronic systolic heart failure/cardiomyopathy -Echocardiogram EF 35% with hypokinesis worse in the inferior and septal walls -Last echocardiogram 05/20/2016 showed an EF of 45%, basal to mid inferior severe hypokinesis. Indeterminate diastolic function -Patient was given diuresis per nephrology however patient continued to remain oliguric -Continue volume control with HD -Monitor intake and output, daily weights -Cardiology consulted and appreciated. -Patient presently not a candidate for aggressive cardiac  evaluation per cardiology   Somnolence -Appears to mildly improved. Currently on C Pap -ABG did not show CO2 retention  Right loculated pleural effusion -No  signs of an infection at this time -CT chest: Right-sided calcified pleural plaque, nonspecific with differential including asbestosis related pleural disease versus pleural insult such as trauma or infection. Small bilateral pleural effusions, probably loculated bilaterally with small loculated components. Right major fissure.  Atrial fibrillation/aortic valve replacement -Patient presented with supratherapeutic INR, given vitamin K as well as fresh frozen plasma -INR currently 2.56 -Heparin per pharmacy, Coumadin on hold -hemoglobin currently stable  Dyspnea -Possibly secondary to volume overload -continue hemodialysis and supplement O2 as needed  Essential hypertension complicated by ?hypotension -Manual BP stable -Continue midodrine  Diabetes mellitus, type II -Hemoglobin A1c was 5.1 on 03/05/2017  Atherosclerotic thoracic aorta -4.3 cm ectatic ascending thoracic aorta noted on CT scan -Patient will need annual imaging by CTA or MRA  Possible cirrhosis/ elevated AST -Noted on CT chest -AST improving slightly, last 105 (per patient and son, no longer consumes alcohol) -Suspect elevated AST secondary to medications -Reviewed medications with pharmacy. One, Synthroid, Protonix, Lasix may all increased LFTs. CRP also increase AST. Lasix has been discontinued. Given that patient currently requires Protonix for GI bleed as well as heparin for AF, will continue to monitor LFTs closely. -Pending LFTs this morning.   Mildly elevated troponin -Likely secondary to the above, currently chest pain-free -Troponin elevation not consistent with ACS -Echocardiogram as above  NSVT -Overnight 04/15/17, had 9beat run of VT -Magnesium 2.4  -Potassium 4.7 -Continue to monitor   Hypothyroidism -Continue  Synthroid  Nausea -possibly related to the start of hemodialysis -Currently denies further nausea, Zofran has been ordered however patient has not used.  Obstructive sleep apnea -Continue CPAP QHS  Goals of care/Code status -Discussed code status with patient, he is not sure if he wants CPR. Will consult palliative care as patient is at high risk for decompensation given acute CHF exac with new start of HD and recurrent GI bleeding.  -Continue to monitor and stepdown. -Palliative care consulted and appreciated, pending evaluation  DVT Prophylaxis  heparin --> SCDs  Code Status: Full  Family Communication: None at bedside.  Disposition Plan: Admitted. Continue to monitor in stepdown. Dispo TBD  Consultants Nephrology Gastroenterology Interventional radiology  Cardiology Palliative care  Procedures  EGD x 2 Right IJ HD tunneled catheter placement Echocardiogram  Antibiotics   Anti-infectives    Start     Dose/Rate Route Frequency Ordered Stop   04/12/17 1407  ceFAZolin (ANCEF) 2-4 GM/100ML-% IVPB    Comments:  Dhers, Patricia   : cabinet override      04/12/17 1407 04/13/17 0214   04/12/17 1100  ceFAZolin (ANCEF) IVPB 2g/100 mL premix     2 g 200 mL/hr over 30 Minutes Intravenous On call 04/12/17 1019 04/12/17 1443      Subjective:   Robert Stanton seen and examined today. Patient continues to complain of feeling weak. Feels breathing has improved mildly. Continues to feel swollen. Denies any chest pain, abdominal pain, nausea or vomiting at this time. Per RN, patient did have bloody bowel movement overnight.  Objective:   Vitals:   04/18/17 2300 04/19/17 0252 04/19/17 0700 04/19/17 0757  BP: (!) 100/41 (!) 101/54  (!) 112/53  Pulse: 74 72  72  Resp: 19 (!) 23  20  Temp:  97.9 F (36.6 C)  97.7 F (36.5 C)  TempSrc:  Axillary  Oral  SpO2: 95% 94% 95% 99%  Weight:      Height:        Intake/Output Summary (Last 24 hours) at 04/19/17 9518  Last data  filed at 04/19/17 0530  Gross per 24 hour  Intake           700.49 ml  Output               15 ml  Net           685.49 ml   Filed Weights   04/14/17 1820 04/15/17 1429 04/15/17 1811  Weight: 105 kg (231 lb 7.7 oz) 108 kg (238 lb 1.6 oz) 106.2 kg (234 lb 2.1 oz)   Exam  General: Well developed, Chronically ill appearing, no apparent distress  HEENT: NCAT, CPap mask in place  Cardiovascular: S1 S2 auscultated, +click, IRR  Respiratory: Diminished breath sounds, mild expiratory wheezing  Abdomen: Soft, nontender, nondistended, + bowel sounds  Extremities: warm dry without cyanosis clubbing. Significant Lower extremity Edema up to abdomen  Neuro: AAOx3, nonfocal   Psych: Flat   Data Reviewed: I have personally reviewed following labs and imaging studies  CBC:  Recent Labs Lab 04/17/17 1248 04/17/17 2006 04/18/17 0042 04/18/17 0742 04/19/17 0045 04/19/17 0729  WBC 8.3 8.4 8.8 9.2 9.4  --   HGB 8.6* 8.8* 8.8* 8.7* 8.6* 8.8*  HCT 26.7* 26.3* 25.8* 26.8* 25.7* 26.4*  MCV 98.2 97.8 97.4 100.0 99.2  --   PLT 128* 116* 100* 107* 102*  --    Basic Metabolic Panel:  Recent Labs Lab 04/13/17 0715  04/15/17 0525 04/16/17 0306 04/16/17 0734 04/17/17 1248 04/18/17 0742 04/19/17 0045  NA 133*  < > 134* 135  --  135 133* 132*  K 4.3  < > 3.9 4.2  --  4.9 4.6 4.7  CL 92*  < > 93* 96*  --  95* 94* 95*  CO2 22  < > 25 25  --  25 25 25   GLUCOSE 118*  < > 101* 113*  --  115* 89 105*  BUN 155*  < > 73* 43*  --  68* 40* 54*  CREATININE 11.80*  < > 8.23* 6.06*  --  8.22* 5.35* 6.33*  CALCIUM 8.0*  < > 8.5* 8.6*  --  9.0 8.6* 8.9  MG  --   --   --   --  2.4  --   --   --   PHOS 8.2*  --   --   --   --   --   --  7.2*  < > = values in this interval not displayed. GFR: Estimated Creatinine Clearance: 12.1 mL/min (A) (by C-G formula based on SCr of 6.33 mg/dL (H)). Liver Function Tests:  Recent Labs Lab 04/14/17 0623 04/15/17 0525 04/16/17 0306 04/17/17 1248  04/18/17 0742 04/19/17 0045  AST 126* 105* 106* 99* 105*  --   ALT 39 28 25 24 26   --   ALKPHOS 63 63 73 67 67  --   BILITOT 3.9* 4.3* 4.7* 5.0* 5.8*  --   PROT 7.3 7.7 7.7 7.7 7.2  --   ALBUMIN 2.9* 3.0* 3.0* 3.0* 3.1* 3.0*   No results for input(s): LIPASE, AMYLASE in the last 168 hours. No results for input(s): AMMONIA in the last 168 hours. Coagulation Profile:  Recent Labs Lab 04/15/17 0525 04/16/17 0306 04/17/17 0557 04/18/17 0742 04/19/17 0045  INR 1.55 1.74 1.89 1.85 2.57   Cardiac Enzymes: No results for input(s): CKTOTAL, CKMB, CKMBINDEX, TROPONINI in the last 168 hours. BNP (last 3 results) No results for input(s): PROBNP in the last 8760 hours. HbA1C: No results for  input(s): HGBA1C in the last 72 hours. CBG: No results for input(s): GLUCAP in the last 168 hours. Lipid Profile: No results for input(s): CHOL, HDL, LDLCALC, TRIG, CHOLHDL, LDLDIRECT in the last 72 hours. Thyroid Function Tests: No results for input(s): TSH, T4TOTAL, FREET4, T3FREE, THYROIDAB in the last 72 hours. Anemia Panel: No results for input(s): VITAMINB12, FOLATE, FERRITIN, TIBC, IRON, RETICCTPCT in the last 72 hours. Urine analysis:    Component Value Date/Time   COLORURINE YELLOW 09/26/2016 1939   APPEARANCEUR CLEAR 09/26/2016 1939   LABSPEC 1.005 09/26/2016 1939   PHURINE 5.0 09/26/2016 1939   GLUCOSEU NEGATIVE 09/26/2016 1939   HGBUR MODERATE (A) 09/26/2016 1939   BILIRUBINUR NEGATIVE 09/26/2016 1939   KETONESUR NEGATIVE 09/26/2016 1939   PROTEINUR NEGATIVE 09/26/2016 1939   NITRITE NEGATIVE 09/26/2016 1939   LEUKOCYTESUR NEGATIVE 09/26/2016 1939   Sepsis Labs: @LABRCNTIP (procalcitonin:4,lacticidven:4)  ) Recent Results (from the past 240 hour(s))  MRSA PCR Screening     Status: None   Collection Time: 04/17/17  2:31 PM  Result Value Ref Range Status   MRSA by PCR NEGATIVE NEGATIVE Final    Comment:        The GeneXpert MRSA Assay (FDA approved for NASAL  specimens only), is one component of a comprehensive MRSA colonization surveillance program. It is not intended to diagnose MRSA infection nor to guide or monitor treatment for MRSA infections.       Radiology Studies: No results found.   Scheduled Meds: . albuterol  2.5 mg Nebulization TID  . calcium acetate  1,334 mg Oral TID WC  . darbepoetin (ARANESP) injection - DIALYSIS  60 mcg Intravenous Q Tue-HD  . levothyroxine  50 mcg Oral QAC breakfast  . midodrine  5 mg Oral BID WC  . montelukast  10 mg Oral QHS  . multivitamin  1 tablet Oral QHS  . pantoprazole (PROTONIX) IV  40 mg Intravenous Q12H  . sucralfate  1 g Oral Q6H   Continuous Infusions: . ferric gluconate (FERRLECIT/NULECIT) IV Stopped (04/18/17 1646)     LOS: 9 days   Time Spent in minutes   40 minutes  Tristy Udovich D.O. on 04/19/2017 at 9:58 AM  Between 7am to 7pm - Pager - [phone]  After 7pm go to [email or web address] - password [email or web address]  And look for the night coverage person covering for me after hours  [facility] Office  [phone]

## 2017-04-19 NOTE — Progress Notes (Signed)
ANTICOAGULATION CONSULT NOTE - Follow Up Consult  Pharmacy Consult for Heparin Indication: atrial fibrillation and mechanical atrial valve  Patient Measurements: Height: 5\' 8"  (172.7 cm) Weight:  (problem with bed scale ) IBW/kg (Calculated) : 68.4 Heparin Dosing Weight: 93 kg  Vital Signs: Temp: 98.2 F (36.8 C) (07/03 1903) Temp Source: Oral (07/03 1903) BP: 94/45 (07/03 1903) Pulse Rate: 76 (07/03 1903)  Labs:  Recent Labs  04/17/17 0557 04/17/17 1248  04/18/17 0042 04/18/17 0742 04/19/17 0045 04/19/17 0729 04/19/17 2014  HGB 8.8* 8.6*  < > 8.8* 8.7* 8.6* 8.8*  --   HCT 26.7* 26.7*  < > 25.8* 26.8* 25.7* 26.4*  --   PLT 122* 128*  < > 100* 107* 102*  --   --   LABPROT 21.9*  --   --   --  21.6* 28.1*  --  27.7*  INR 1.89  --   --   --  1.85 2.57  --  2.52  HEPARINUNFRC 0.26*  --   --   --   --  0.17*  --   --   CREATININE  --  8.22*  --   --  5.35* 6.33*  --   --   < > = values in this interval not displayed.  Estimated Creatinine Clearance: 12.1 mL/min (A) (by C-G formula based on SCr of 6.33 mg/dL (H)).  Assessment: 76 yo M admitted 6/24 with rectal bleeding.  On anticoagulation PTA for mechanical AVR and afib.  Coumadin on hold per GI recs with recent GIB.  GI/Cards ok to restart heparin when INR <2.5.  Goal of Therapy: Heparin level 0.3 - 0.5 units/ml INR 2.5-3  Monitor platelets by anticoagulation protocol: Yes   Plan:  No heparin tonight Hold warfarin per GI INR q12h (to be safe with valve and until warfarin resumed), CBC daily Monitor s/sx of bleeding  [employer], Pharm.D., BCPS Clinical Pharmacist 04/19/2017 9:59 PM

## 2017-04-19 NOTE — Plan of Care (Signed)
Problem: Fluid Volume: Goal: Will show no signs and symptoms of excessive bleeding Outcome: Progressing Continue to monitor

## 2017-04-19 NOTE — Progress Notes (Signed)
Progress Note  Patient Name: Robert Stanton Date of Encounter: 04/19/2017  Primary Cardiologist: Dr [name]  Subjective   Pt denies CP or dyspnea; continues with melena by his report  Inpatient Medications    Scheduled Meds: . albuterol  2.5 mg Nebulization TID  . calcium acetate  1,334 mg Oral TID WC  . darbepoetin (ARANESP) injection - DIALYSIS  60 mcg Intravenous Q Tue-HD  . levothyroxine  50 mcg Oral QAC breakfast  . midodrine  5 mg Oral BID WC  . montelukast  10 mg Oral QHS  . multivitamin  1 tablet Oral QHS  . pantoprazole (PROTONIX) IV  40 mg Intravenous Q12H  . sucralfate  1 g Oral Q6H   Continuous Infusions: . ferric gluconate (FERRLECIT/NULECIT) IV Stopped (04/18/17 1646)   PRN Meds: ipratropium-albuterol, ondansetron (ZOFRAN) IV   Vital Signs    Vitals:   04/18/17 2300 04/19/17 0252 04/19/17 0700 04/19/17 0757  BP: (!) 100/41 (!) 101/54  (!) 112/53  Pulse: 74 72  72  Resp: 19 (!) 23  20  Temp:  97.9 F (36.6 C)  97.7 F (36.5 C)  TempSrc:  Axillary  Oral  SpO2: 95% 94% 95% 99%  Weight:      Height:        Intake/Output Summary (Last 24 hours) at 04/19/17 0826 Last data filed at 04/19/17 0530  Gross per 24 hour  Intake           700.49 ml  Output               15 ml  Net           685.49 ml   Filed Weights   04/14/17 1820 04/15/17 1429 04/15/17 1811  Weight: 105 kg (231 lb 7.7 oz) 108 kg (238 lb 1.6 oz) 106.2 kg (234 lb 2.1 oz)    Telemetry    Atrial fibrillation with PVCs or aberrantly conducted beats; rate controlled - Personally Reviewed   Physical Exam   GEN: WD/ Chronically ill appearing Neck: supple, no JVD Cardiac: irregular, crisp mechanical valve sound; no murmur Respiratory: CTA anteriorly GI: Soft, nontender, non-distended, no masses MS: no edema Neuro:  Grossly intact   Labs    Chemistry  Recent Labs Lab 04/16/17 0306 04/17/17 1248 04/18/17 0742 04/19/17 0045  NA 135 135 133* 132*  K 4.2 4.9 4.6 4.7  CL  96* 95* 94* 95*  CO2 25 25 25 25   GLUCOSE 113* 115* 89 105*  BUN 43* 68* 40* 54*  CREATININE 6.06* 8.22* 5.35* 6.33*  CALCIUM 8.6* 9.0 8.6* 8.9  PROT 7.7 7.7 7.2  --   ALBUMIN 3.0* 3.0* 3.1* 3.0*  AST 106* 99* 105*  --   ALT 25 24 26   --   ALKPHOS 73 67 67  --   BILITOT 4.7* 5.0* 5.8*  --   GFRNONAA 8* 6* 9* 8*  GFRAA 9* 7* 11* 9*  ANIONGAP 14 15 14 12      Hematology  Recent Labs Lab 04/18/17 0042 04/18/17 0742 04/19/17 0045 04/19/17 0729  WBC 8.8 9.2 9.4  --   RBC 2.65* 2.68* 2.59*  --   HGB 8.8* 8.7* 8.6* 8.8*  HCT 25.8* 26.8* 25.7* 26.4*  MCV 97.4 100.0 99.2  --   MCH 33.2 32.5 33.2  --   MCHC 34.1 32.5 33.5  --   RDW 23.3* 23.9* 24.6*  --   PLT 100* 107* 102*  --     Patient Profile  76 y.o. male with past medical history of coronary artery disease status post coronary artery bypass graft, prior [employer] aortic valve replacement, permanent atrial fibrillation, COPD, PAD and renal insufficiency admitted with GI bleed. Echocardiogram showed worsening LV function and cardiology asked to evaluate. Note hemodialysis initiated.  EGD revealed AVMs with hemostatic endoscopy.  Assessment & Plan    1 cardiomyopathy-LV function mildly reduced compared to previous. We cannot add a beta blocker as his heart rate is low normal at baseline in atrial fibrillation. His blood pressure will not allow hydralazine/nitrates. Not presently a candidate for aggressive cardiac evaluation.  2 permanent atrial fibrillation-HR remains controlled. Presently on heparin. Would resume coumadin when it is clear GI bleeding is stable (will need GI input on timing).  3 status post aortic valve replacement-Resume coumadin when ok with GI.  4 acute on chronic stage IV kidney disease-dialysis has now been initiated. Nephrology following  5 GI bleed-recurrent bleeding. Continues with melena by his report. Hgb unchanged.  6 thoracic aortic aneurysm-noted on CT scan. Measures 4.3 cm. Would not  pursue further imaging as he is not a candidate at present for intervention.  7 coronary artery disease status post coronary artery bypass graft-aspirin is on hold because of GI bleed. Resume when able. Resume statin at discharge.  Signed, Kirk Ruths, MD  04/19/2017, 8:26 AM

## 2017-04-19 NOTE — Clinical Social Work Note (Signed)
CSW continues to follow for discharge needs.  Hayla Hinger, CSW [phone]  

## 2017-04-20 DIAGNOSIS — I5043 Acute on chronic combined systolic (congestive) and diastolic (congestive) heart failure: Secondary | ICD-10-CM

## 2017-04-20 DIAGNOSIS — N179 Acute kidney failure, unspecified: Secondary | ICD-10-CM

## 2017-04-20 LAB — CBC
HEMATOCRIT: 26.3 % — AB (ref 39.0–52.0)
Hemoglobin: 8.5 g/dL — ABNORMAL LOW (ref 13.0–17.0)
MCH: 32.6 pg (ref 26.0–34.0)
MCHC: 32.3 g/dL (ref 30.0–36.0)
MCV: 100.8 fL — AB (ref 78.0–100.0)
Platelets: 92 10*3/uL — ABNORMAL LOW (ref 150–400)
RBC: 2.61 MIL/uL — ABNORMAL LOW (ref 4.22–5.81)
RDW: 26.3 % — AB (ref 11.5–15.5)
WBC: 10.4 10*3/uL (ref 4.0–10.5)

## 2017-04-20 LAB — COMPREHENSIVE METABOLIC PANEL
ALK PHOS: 82 U/L (ref 38–126)
ALT: 67 U/L — ABNORMAL HIGH (ref 17–63)
AST: 310 U/L — ABNORMAL HIGH (ref 15–41)
Albumin: 2.8 g/dL — ABNORMAL LOW (ref 3.5–5.0)
Anion gap: 13 (ref 5–15)
BUN: 37 mg/dL — AB (ref 6–20)
CALCIUM: 8.6 mg/dL — AB (ref 8.9–10.3)
CO2: 26 mmol/L (ref 22–32)
CREATININE: 4.83 mg/dL — AB (ref 0.61–1.24)
Chloride: 94 mmol/L — ABNORMAL LOW (ref 101–111)
GFR, EST AFRICAN AMERICAN: 12 mL/min — AB (ref >60)
GFR, EST NON AFRICAN AMERICAN: 11 mL/min — AB (ref >60)
Glucose, Bld: 100 mg/dL — ABNORMAL HIGH (ref 65–99)
Potassium: 4.2 mmol/L (ref 3.5–5.1)
SODIUM: 133 mmol/L — AB (ref 135–145)
Total Bilirubin: 8.7 mg/dL — ABNORMAL HIGH (ref 0.3–1.2)
Total Protein: 7.2 g/dL (ref 6.5–8.1)

## 2017-04-20 LAB — HEPARIN LEVEL (UNFRACTIONATED): Heparin Unfractionated: 0.16 IU/mL — ABNORMAL LOW (ref 0.30–0.70)

## 2017-04-20 LAB — PROTIME-INR
INR: 2.4
Prothrombin Time: 26.6 seconds — ABNORMAL HIGH (ref 11.4–15.2)

## 2017-04-20 MED ORDER — HEPARIN (PORCINE) IN NACL 100-0.45 UNIT/ML-% IJ SOLN
1500.0000 [IU]/h | INTRAMUSCULAR | Status: DC
  Administered 2017-04-20: 1250 [IU]/h via INTRAVENOUS
  Filled 2017-04-20 (×2): qty 250

## 2017-04-20 NOTE — Progress Notes (Signed)
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 H/H has been stable. LVEF has declined to 35% from 45% 1 year ago. There is a focal inferior wall motion abnormality which is old.   Objective   Vitals:   04/19/17 1903 04/19/17 2302 04/20/17 0258 04/20/17 0750  BP: (!) 94/45 (!) 93/52 (!) 95/51 (!) 103/51  Pulse: 76 75 71 (!) 50  Resp: _0 Temp: 98.2 F (36.8 C) 99.4 F (37.4 C) 97.9 F (36.6 C) 98.5 F (36.9 C)  TempSrc: Oral Oral Oral Axillary  SpO2: 93% 92% 95% 98%  Weight:      Height:        Intake/Output Summary (Last 24 hours) at 04/20/17 0754 Last data filed at 04/19/17 1903  Gross per 24 hour  Intake              480 ml  Output             2000 ml  Net            -1520 ml   Filed Weights   04/15/17 1429 04/15/17 1811  Weight: 238 lb 1.6 oz (108 kg) 234 lb 2.1 oz (106.2 kg)    Physical Exam   General appearance: alert and no distress Neck: JVD - 3 cm above sternal notch and no carotid  bruit Lungs: diminished breath sounds bibasilar Heart: regular rate and rhythm Extremities: edema 1+ edeam Neurologic: Mental status: Alert, knows me, oriented to place, year  Inpatient Medications    Scheduled Meds: . albuterol  2.5 mg Nebulization TID  . calcium acetate  1,334 mg Oral TID WC  . darbepoetin (ARANESP) injection - DIALYSIS  60 mcg Intravenous Q Tue-HD  . levothyroxine  50 mcg Oral QAC breakfast  . midodrine  5 mg Oral BID WC  . montelukast  10 mg Oral QHS  . multivitamin  1 tablet Oral QHS  . pantoprazole (PROTONIX) IV  40 mg Intravenous Q12H  . sucralfate  1 g Oral Q6H    Continuous Infusions: . heparin      PRN Meds: ipratropium-albuterol, ondansetron (ZOFRAN) IV   Labs   Results for orders placed or performed during the hospital encounter of 04/10/17 (from the past 48 hour(s))  Blood gas, arterial     Status: Abnormal   Collection Time: 04/18/17  8:25 AM  Result Value Ref Range   O2 Content 2.0 L/min  Delivery systems CONTINUOUS POSITIVE AIRWAY PRESSURE    pH, Arterial 7.441 7.350 - 7.450   pCO2 arterial 38.9 32.0 - 48.0 mmHg   pO2, Arterial 72.6 (L) 83.0 - 108.0 mmHg   Bicarbonate 26.0 20.0 - 28.0 mmol/L   Acid-Base Excess 2.2 (H) 0.0 - 2.0 mmol/L   O2 Saturation 94.5 %   Patient temperature 98.6    Collection site RIGHT RADIAL    Drawn by [phone]    Sample type ARTERIAL DRAW    Allens test (pass/fail) PASS PASS  Heparin level (unfractionated)     Status: Abnormal   Collection Time: 04/19/17 12:45 AM  Result Value Ref Range   Heparin Unfractionated 0.17 (L) 0.30 - 0.70 IU/mL    Comment:        IF HEPARIN RESULTS ARE BELOW EXPECTED VALUES, AND PATIENT DOSAGE HAS BEEN CONFIRMED, SUGGEST FOLLOW UP TESTING OF ANTITHROMBIN III LEVELS.   CBC     Status: Abnormal   Collection Time: 04/19/17 12:45 AM  Result Value Ref Range   WBC 9.4 4.0 - 10.5 K/uL   RBC 2.59 (L) 4.22 - 5.81 MIL/uL   Hemoglobin 8.6 (L) 13.0 - 17.0 g/dL   HCT 25.7 (L) 39.0 -  52.0 %   MCV 99.2 78.0 - 100.0 fL   MCH 33.2 26.0 - 34.0 pg   MCHC 33.5 30.0 - 36.0 g/dL   RDW 24.6 (H) 11.5 - 15.5 %   Platelets 102 (L) 150 - 400 K/uL    Comment: CONSISTENT WITH PREVIOUS RESULT  Protime-INR     Status: Abnormal   Collection Time: 04/19/17 12:45 AM  Result Value Ref Range   Prothrombin Time 28.1 (H) 11.4 - 15.2 seconds   INR 2.57   Renal function panel     Status: Abnormal   Collection Time: 04/19/17 12:45 AM  Result Value Ref Range   Sodium 132 (L) 135 - 145 mmol/L   Potassium 4.7 3.5 - 5.1 mmol/L   Chloride 95 (L) 101 - 111 mmol/L   CO2 25 22 - 32 mmol/L   Glucose, Bld 105 (H) 65 - 99 mg/dL   BUN 54 (H) 6 - 20 mg/dL   Creatinine, Ser 6.33 (H) 0.61 - 1.24 mg/dL   Calcium 8.9 8.9 - 10.3 mg/dL   Phosphorus 7.2 (H) 2.5 - 4.6 mg/dL   Albumin 3.0 (L) 3.5 - 5.0 g/dL   GFR calc non Af Amer 8 (L) >60 mL/min   GFR calc Af Amer 9 (L) >60 mL/min    Comment: (NOTE) The eGFR has been calculated using the CKD EPI equation. This calculation has not been validated in all clinical situations. eGFR's persistently <60 mL/min signify possible Chronic Kidney Disease.    Anion gap 12 5 - 15  Hepatic function panel     Status: Abnormal   Collection Time: 04/19/17 12:45 AM  Result Value Ref Range   Total Protein 7.4 6.5 - 8.1 g/dL   Albumin 2.9 (L) 3.5 - 5.0 g/dL   AST 113 (H) 15 - 41 U/L   ALT 28 17 - 63 U/L   Alkaline Phosphatase 66 38 - 126 U/L   Total Bilirubin 6.5 (H) 0.3 - 1.2 mg/dL   Bilirubin, Direct 3.5 (H) 0.1 - 0.5 mg/dL   Indirect Bilirubin 3.0 (H) 0.3 - 0.9 mg/dL  Hemoglobin and hematocrit, blood     Status: Abnormal   Collection Time: 04/19/17  7:29 AM  Result Value Ref Range   Hemoglobin 8.8 (L) 13.0 - 17.0  g/dL   HCT 26.4 (L) 39.0 - 52.0 %  Protime-INR     Status: Abnormal   Collection Time: 04/19/17  8:14 PM  Result Value Ref Range   Prothrombin Time 27.7 (H) 11.4 - 15.2 seconds   INR 2.52   CBC     Status: Abnormal   Collection Time: 04/20/17   6:02 AM  Result Value Ref Range   WBC 10.4 4.0 - 10.5 K/uL   RBC 2.61 (L) 4.22 - 5.81 MIL/uL   Hemoglobin 8.5 (L) 13.0 - 17.0 g/dL   HCT 26.3 (L) 39.0 - 52.0 %   MCV 100.8 (H) 78.0 - 100.0 fL   MCH 32.6 26.0 - 34.0 pg   MCHC 32.3 30.0 - 36.0 g/dL   RDW 26.3 (H) 11.5 - 15.5 %   Platelets 92 (L) 150 - 400 K/uL    Comment: REPEATED TO VERIFY CONSISTENT WITH PREVIOUS RESULT   Protime-INR     Status: Abnormal   Collection Time: 04/20/17  6:02 AM  Result Value Ref Range   Prothrombin Time 26.6 (H) 11.4 - 15.2 seconds   INR 2.40   Comprehensive metabolic panel     Status: Abnormal   Collection Time: 04/20/17  6:02 AM  Result Value Ref Range   Sodium 133 (L) 135 - 145 mmol/L   Potassium 4.2 3.5 - 5.1 mmol/L   Chloride 94 (L) 101 - 111 mmol/L   CO2 26 22 - 32 mmol/L   Glucose, Bld 100 (H) 65 - 99 mg/dL   BUN 37 (H) 6 - 20 mg/dL   Creatinine, Ser 4.83 (H) 0.61 - 1.24 mg/dL   Calcium 8.6 (L) 8.9 - 10.3 mg/dL   Total Protein 7.2 6.5 - 8.1 g/dL   Albumin 2.8 (L) 3.5 - 5.0 g/dL   AST 310 (H) 15 - 41 U/L   ALT 67 (H) 17 - 63 U/L   Alkaline Phosphatase 82 38 - 126 U/L   Total Bilirubin 8.7 (H) 0.3 - 1.2 mg/dL   GFR calc non Af Amer 11 (L) >60 mL/min   GFR calc Af Amer 12 (L) >60 mL/min    Comment: (NOTE) The eGFR has been calculated using the CKD EPI equation. This calculation has not been validated in all clinical situations. eGFR's persistently <60 mL/min signify possible Chronic Kidney Disease.    Anion gap 13 5 - 15    ECG   N/A  Telemetry   Sinus with PVC's - Personally Reviewed  Radiology    No results found.  Cardiac Studies   LV EF: 45%  ------------------------------------------------------------------- Indications:      (R07.9).  ------------------------------------------------------------------- History:   PMH:  PAD. AAA. Vtach. OSA. Acquired from the patient and from the patient&'s chart.  Atrial fibrillation.  Atrial flutter.  Coronary artery  disease.  Chronic obstructive pulmonary disease.  Risk factors:  Hypertension. Diabetes mellitus. Obese. Dyslipidemia.  ------------------------------------------------------------------- Study Conclusions  - Left ventricle: The cavity size was mildly dilated. Wall   thickness was normal. The estimated ejection fraction was 45%.   Basal to mid inferior severe hypokinesis. Inferolateral wall   poorly visualized. Indeterminant diastolic function. - Aortic valve: [employer] Jude mechanical aortic valve. Mildly elevated   gradient across the mechanical aortic valve. There was trivial   regurgitation. Mean gradient (S): 19 mm Hg. Peak gradient (S): 31   mm Hg. - Mitral valve: Mildly to moderately calcified annulus. There was   trivial regurgitation. - Left atrium: The atrium was mildly dilated. - Right  ventricle: The cavity size was normal. Systolic function   was mildly reduced. - Right atrium: The atrium was mildly dilated. - Tricuspid valve: Peak RV-RA gradient (S): 30 mm Hg. - Pulmonary arteries: PA peak pressure: 45 mm Hg (S). - Systemic veins: IVC measured 2.4 cm with < 50% respirophasic   variation, suggesting RA pressure 15 mmHg. .  Impressions:  - Mildly dilated LV with EF 45%. Basal to mid inferior severe   hypokinesis, inferolateral wall poorly visualized. Normal RV size   with mildly decreased systolic function. Mild pulmonary   hypertension. Dilated IVC suggesting elevated RV filling   pressure. Mechanical aortic valve with mildly elevated mean   gradient.  Assessment   1. Principal Problem: 2.   GI bleed 3. Active Problems: 4.   Essential hypertension 5.   Coronary atherosclerosis 6.   ESOPHAGEAL STRICTURE 7.   PAD,s/p aorto bifem BG, s/p Rt Fem PTA 3/15 8.   Gout 9.   OSA and COPD overlap syndrome ([place]) 10.   AAA (abdominal aortic aneurysm) ([place]) 11.   Permanent atrial fibrillation ([place]) 12.   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   Long term  current use of anticoagulant therapy 14.   Status post mechanical aortic valve replacement 15.   Generalized anxiety disorder 16.   Hypothyroidism 17.   Dizziness 18.   Chronic kidney disease, stage V ([place]) 19.   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   Acute gastric erosion 21.   Esophageal stricture 22.   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   Gastritis and gastroduodenitis 24.   AKI (acute kidney injury) ([place]) 25.   Plan   1. Mr. Buntrock seems to be improving with dialysis - however, remains somewhat confused. H/H has been stable. Warfarin restarted and INR therapeutic at 2.4. Palliative care has been consulted, which I think is appropriate. LVEF is lower at 35%. BP remains low - may not support HF meds at this time.   Time Spent Directly with Patient:  I have spent a total of 15 minutes with the patient reviewing hospital notes, telemetry, EKGs, labs and examining the patient as well as establishing an assessment and plan that was discussed personally with the patient. > 50% of time was spent in direct patient care.  Length of Stay:  LOS: 10 days   Pixie Casino, MD, [place]  Attending Cardiologist  Direct Dial: [phone]  Fax: [phone]  Website:  [email or web address] 04/20/2017, 7:54 AM

## 2017-04-20 NOTE — Consult Note (Signed)
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 He was hospitalized for recurrent GIB, diffuse AVMs high risk on anticoagulation. His functional status has declined dramatically since admission. Palliative consulted for goals of care.   I spoke with patient and his wife separately this morning. I explained his condition, his poor prognosis and the difficult situation and choices ahead-I think Mr. Luhmann is coming to the realization that things may not be getting better for him. His wife has serious health problems, is in a wheelchair and is O2 dependent- she is devastated by the possibility he may not recover they have been married 49 years. He is now a DNR. They have 3 sons, one is the primary caregiver and lives with them.   I explained the need to be able to transfer into outpatient HD chair, and also issues related to his deconditioning, poor functional status and QOL. He is and will remain at high risk for sudden death and rapid decline but prognostication is difficult unless he makes an active decision to stop HD and transition to comfort care- this really depends on how much we offer medically and how much suffering he is willing to tolerate.   1. DNR 2. Family meeting including 2 sons, wife and patient scheduled for 7/5 at [place], DO Palliative Medicine Time: 35 min Greater than 50%  of this time was spent counseling and coordinating care related to the above assessment and plan.

## 2017-04-20 NOTE — Progress Notes (Addendum)
Patient ID: Robert Stanton, male   DOB: Mar 15, 1941, 76 y.o.   MRN: [id number]                                                                PROGRESS NOTE                                                                                                                                                                                                             Patient Demographics:    Robert Stanton, is a 76 y.o. male, DOB - 06-28-1941, KDT:[id number]  Admit date - 04/10/2017   Admitting Physician Ejiroghene Arlyce Dice, MD  Outpatient Primary MD for the patient is Unk Pinto, MD  LOS - 10  Outpatient Specialists:    Chief Complaint  Patient presents with  . Nausea  . Dizziness       Brief Narrative   HPI on 04/10/2017 by Dr. Ralene Muskrat Atkinsonis a 76 y.o.malewith medical history significant for- hypertension, coronary artery disease- CABG- 2010, atrial fibrillation CKD5, hypertension, esophageal strictures, gout, COPD, OSA, mechanical aortic valve, hypothyroidism, peripheral artery disease, type II DM. Patient came in with complaints of bleeding from his rectum was started about 3 days ago, he also reports dark stools- 3-4 days. He denies abdominal pain. In the ED he had an episode of vomiting- with coffee-ground emesis.  He endorses dizziness that started Saturday morning- 04/09/17. This appears to be a chronic issue for patient. Pt denies NSAID use. Patient reports a history of peptic ulcer disease diagnosed with EGD- several years ago. Last EGD 10/2012- was esophagealstricture and erosions, erosive gastritis. Last colonoscopy was about 10 years ago- reports it was normal. He also notes a chronic cough that is productive today, though he has a history of COPD. Hisshortness of breath is at baseline. He denies fever or chills.  Interim history Patient was found to have heme positive stools, with increasing creatinine and elevated INR. He was admitted for  evaluation of GI bleeding. Patient underwent EGD showing AVMs status post hemostatic endoscopy with APC. Nephrology also consulted and hemodialysis initiated. Patient started to have GIBleed again, anticoag held. S/p EGD on 7/2.    Subjective:    [name] today has been doing well, just got back from dialysis. bp slightly soft per  RN. Pt asymptomatic.   No headache, No chest pain, No abdominal pain - No Nausea, No new weakness tingling or numbness, No Cough - SOB.    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 Esophageal stricture without esophagitis. Moderately large hiatal hernia with [name] erosions. -Continue iron supplementation 325mg  BID -Overnight, patient supposedly had several bloody bowel movements, however hemoglobin currently 8.8 (higher than the past few days) -Status post EGD 04/18/2017=> Nonbleeding Cameron erosions, most notable for moderate to severe pain and gastritis resulting in very friable gastric mucosa. Pathology => focal tubular  adenoma H. Pylori negative Patient started on Carafate, continue PPI twice daily. GI felt heparin IV could be restarted however Coumadin to be held. Heparin restarted 7/2, INR => 2.57  (7/2) -Overnight, patient did have another bloody bowel movement. Hemoglobin currently stable at 8.8   -Pending further recommendations from cardiology as well as GI regarding anticoagulation. 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 However not stable for discharge at this time -Continues to be very volume overloaded  Acute on Chronic systolic heart failure/cardiomyopathy -Echocardiogram EF 35% with hypokinesis worse in the inferior and septal walls -Last echocardiogram 05/20/2016 showed an EF of 45%, basal to mid inferior severe hypokinesis. Indeterminate diastolic function -Patient was given diuresis per nephrology however patient continued to remain oliguric -Continue volume control with HD -Monitor intake and output, daily weights -Cardiology consulted and appreciated. -Patient presently not a candidate for aggressive cardiac evaluation per cardiology   Somnolence -Appears to mildly improved. Currently on C Pap -ABG did not show CO2 retention  Right loculated pleural effusion -No signs of an infection at this time -CT chest: Right-sided calcified pleural  plaque, nonspecific with differential including asbestosis related pleural disease versus pleural insult such as trauma or infection. Small bilateral pleural effusions, probably loculated bilaterally with small loculated components. Right major fissure.  Atrial fibrillation/aortic valve replacement -Patient presented with supratherapeutic INR, given vitamin K as well as fresh frozen plasma -INR currently 2.56 -  Heparin per pharmacy, Coumadin on hold -hemoglobin currently stable  Dyspnea -Possibly secondary to volume overload -continue hemodialysis and supplement O2 as needed  Essential hypertension complicated by ?hypotension -Manual BP stable -Continue midodrine  Diabetes mellitus, type II -Hemoglobin A1c was 5.1 on 03/05/2017  Atherosclerotic thoracic aorta -4.3 cm ectatic ascending thoracic aorta noted on CT scan -Patient will need annual imaging by CTA or MRA  Possible cirrhosis/ elevated AST -Noted on CT chest -AST improving slightly, last 105 (per patient and son, no longer consumes alcohol) -Suspect elevated AST secondary to medications -Reviewed medications with pharmacy. One, Synthroid, Protonix, Lasix may all increased LFTs. CRP also increase AST. Lasix has been discontinued. Given that patient currently requires Protonix for GI bleed as well as heparin for AF, will continue to monitor LFTs closely. Typically an AST/ALT >3 indicates ETOH related liver disease Will monitor  Mildly elevated troponin -Likely secondary to the above, currently chest pain-free -Troponin elevation not consistent with ACS -Echocardiogram as above  NSVT -Overnight 04/15/17, had 9beat run of VT -Magnesium 2.4  -Potassium 4.7 -Continue to monitor   Hypothyroidism -Continue Synthroid  Nausea -possibly related to the start of hemodialysis -Currently denies further nausea, Zofran has been ordered however patient has not used.  Obstructive sleep apnea -Continue CPAP QHS  Severe  Protein Calorie Malnutrition Prostat  Goals of care/Code status -Discussed code status with patient, he is not sure if he wants CPR. palliative care  Consulted as patient is at high risk for decompensation given acute CHF exac with new start of HD and recurrent GI bleeding.  -Continue to monitor and stepdown. -Palliative care consulted and appreciated, pending evaluation   DVT Prophylaxis  heparin --> SCDs  Code Status: Full  Family Communication: None at bedside.  Disposition Plan: Admitted. Continue to monitor in stepdown. Dispo TBD  Consultants Nephrology Gastroenterology Interventional radiology  Cardiology Palliative care  Procedures  EGD x 2 Right IJ HD tunneled catheter placement Echocardiogram     Lab Results  Component Value Date   PLT 92 (L) 04/20/2017    Antibiotics  :   Anti-infectives    Start     Dose/Rate Route Frequency Ordered Stop   04/12/17 1407  ceFAZolin (ANCEF) 2-4 GM/100ML-% IVPB    Comments:  Dhers, Patricia   : cabinet override      04/12/17 1407 04/13/17 0214   04/12/17 1100  ceFAZolin (ANCEF) IVPB 2g/100 mL premix     2 g 200 mL/hr over 30 Minutes Intravenous On call 04/12/17 1019 04/12/17 1443        Objective:   Vitals:   04/19/17 1830 04/19/17 1903 04/19/17 2302 04/20/17 0258  BP: (!) 90/47 (!) 94/45 (!) 93/52 (!) 95/51  Pulse: 76 76 75 71  Resp:  18 17 14   Temp:  98.2 F (36.8 C) 99.4 F (37.4 C) 97.9 F (36.6 C)  TempSrc:  Oral Oral Oral  SpO2:  93% 92% 95%  Weight:      Height:        Wt Readings from Last 3 Encounters:  04/07/17 109.8 kg (242 lb)  04/05/17 110.4 kg (243 lb 6.4 oz)  03/28/17 111.1 kg (245 lb)     Intake/Output Summary (Last 24 hours) at 04/20/17 0732 Last data filed at 04/19/17 1903  Gross per 24 hour  Intake              480 ml  Output             2000 ml  Net            -1520 ml     Physical Exam  Awake Alert, Oriented X 3, No new F.N deficits, Normal  affect [place].AT,PERRAL Supple Neck,No JVD, No cervical lymphadenopathy appriciated.  Symmetrical Chest wall movement, Good air movement bilaterally, CTAB RRR,No Gallops,Rubs or new Murmurs, No Parasternal Heave +ve B.Sounds, Abd Soft, No tenderness, No organomegaly appriciated, No rebound - guarding or rigidity. No Cyanosis, Clubbing or edema, No new Rash or bruise   Midline scar, scar on right distal leg Right upper chest dialysis catheter    Data Review:    CBC  Recent Labs Lab 04/17/17 2006 04/18/17 0042 04/18/17 0742 04/19/17 0045 04/19/17 0729 04/20/17 0602  WBC 8.4 8.8 9.2 9.4  --  10.4  HGB 8.8* 8.8* 8.7* 8.6* 8.8* 8.5*  HCT 26.3* 25.8* 26.8* 25.7* 26.4* 26.3*  PLT 116* 100* 107* 102*  --  92*  MCV 97.8 97.4 100.0 99.2  --  100.8*  MCH 32.7 33.2 32.5 33.2  --  32.6  MCHC 33.5 34.1 32.5 33.5  --  32.3  RDW 23.3* 23.3* 23.9* 24.6*  --  26.3*    Chemistries   Recent Labs Lab 04/16/17 0306 04/16/17 0734 04/17/17 1248 04/18/17 0742 04/19/17 0045 04/20/17 0602  NA 135  --  135 133* 132* 133*  K 4.2  --  4.9 4.6 4.7 4.2  CL 96*  --  95* 94* 95* 94*  CO2 25  --  25 25 25 26   GLUCOSE 113*  --  115* 89 105* 100*  BUN 43*  --  68* 40* 54* 37*  CREATININE 6.06*  --  8.22* 5.35* 6.33* 4.83*  CALCIUM 8.6*  --  9.0 8.6* 8.9 8.6*  MG  --  2.4  --   --   --   --   AST 106*  --  99* 105* 113* 310*  ALT 25  --  24 26 28  67*  ALKPHOS 73  --  67 67 66 82  BILITOT 4.7*  --  5.0* 5.8* 6.5* 8.7*   ------------------------------------------------------------------------------------------------------------------ No results for input(s): CHOL, HDL, LDLCALC, TRIG, CHOLHDL, LDLDIRECT in the last 72 hours.  Lab Results  Component Value Date   HGBA1C 5.1 03/15/2017   ------------------------------------------------------------------------------------------------------------------ No results for input(s): TSH, T4TOTAL, T3FREE, THYROIDAB in the last 72 hours.  Invalid input(s):  FREET3 ------------------------------------------------------------------------------------------------------------------ No results for input(s): VITAMINB12, FOLATE, FERRITIN, TIBC, IRON, RETICCTPCT in the last 72 hours.  Coagulation profile  Recent Labs Lab 04/17/17 0557 04/18/17 0742 04/19/17 0045 04/19/17 2014 04/20/17 0602  INR 1.89 1.85 2.57 2.52 2.40    No results for input(s): DDIMER in the last 72 hours.  Cardiac Enzymes No results for input(s): CKMB, TROPONINI, MYOGLOBIN in the last 168 hours.  Invalid input(s): CK ------------------------------------------------------------------------------------------------------------------    Component Value Date/Time   BNP 337.5 (H) 10/25/2016 1539    Inpatient Medications  Scheduled Meds: . albuterol  2.5 mg Nebulization TID  . calcium acetate  1,334 mg Oral TID WC  . darbepoetin (ARANESP) injection - DIALYSIS  60 mcg Intravenous Q Tue-HD  . levothyroxine  50 mcg Oral QAC breakfast  . midodrine  5 mg Oral BID WC  . montelukast  10 mg Oral QHS  . multivitamin  1 tablet Oral QHS  . pantoprazole (PROTONIX) IV  40 mg Intravenous Q12H  . sucralfate  1 g Oral Q6H   Continuous Infusions: . heparin     PRN Meds:.ipratropium-albuterol, ondansetron (ZOFRAN) IV  Micro Results Recent Results (from the past 240 hour(s))  MRSA PCR Screening     Status: None   Collection Time: 04/17/17  2:31 PM  Result Value Ref Range Status   MRSA by PCR NEGATIVE NEGATIVE Final    Comment:        The GeneXpert MRSA Assay (FDA approved for NASAL specimens only), is one component of a comprehensive MRSA colonization surveillance program. It is not intended to diagnose MRSA infection nor to guide or monitor treatment for MRSA infections.     Radiology Reports Ct Abdomen Pelvis Wo Contrast  Result Date: 04/10/2017 CLINICAL DATA:  Abdominal pain. History of renal cysts, abdominal aortic aneurysm, hiatal hernia, chronic kidney disease.  EXAM: CT ABDOMEN AND PELVIS WITHOUT CONTRAST TECHNIQUE: Multidetector CT imaging of the abdomen and pelvis was performed following the standard protocol without IV contrast. COMPARISON:  CT abdomen and pelvis September 26, 2016 FINDINGS: LOWER CHEST: New small to moderate pleural effusions with suspected loculation on the RIGHT. The heart is mildly enlarged. At least mild coronary artery calcifications versus stent. HEPATOBILIARY: Dense liver seen with amiodarone use, slightly nodular contour with atrophy. Normal noncontrast CT gallbladder. PANCREAS: Normal. SPLEEN: Normal. ADRENALS/URINARY TRACT: Kidneys are orthotopic,, mildly atrophic LEFT kidney kidneys are orthotopic. Bilateral renal cysts measuring to 5.7 cm upper pole RIGHT kidney. 2 mm RIGHT lower pole nephrolithiasis. Bilateral vascular calcifications. Kicked No hydronephrosis; limited assessment for renal masses on this nonenhanced examination. The unopacified ureters are normal in course and caliber. Urinary bladder is partially distended and unremarkable. Stable 18 mm benign LEFT adrenal adenoma (-22 Hounsfield units). STOMACH/BOWEL: Small hiatal hernia. The stomach, small and large bowel are normal in course and caliber without inflammatory changes. Moderate sigmoid diverticulosis. The appendix is not discretely identified, however there are no inflammatory changes in the right lower quadrant. 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 Severe calcific atherosclerosis. Status post fem-fem bypass. REPRODUCTIVE: Normal. OTHER: Moderate volume ascites.  No intraperitoneal free air. MUSCULOSKELETAL: Anasarca. Multiple subcentimeter nodules anterior abdominal wall subcutaneous fat, possible granulomas. Small bilateral fat containing inguinal hernias. Surgical clips RIGHT inguinal soft tissues compatible with prior vascular access. Moderate to severe degenerative change of the lumbar spine  superimposed on congenital canal narrowing. 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  Anasarca. Nonobstructing 2 mm RIGHT lower pole, bilateral renal vascular calcifications. New small to moderate pleural effusions with suspected loculation on the RIGHT. Severe atherosclerosis. Electronically Signed   By: Elon Alas M.D.   On: 04/10/2017 22:47   Dg Chest 2 View  Result Date: 04/10/2017 CLINICAL DATA:  Nausea and lightheadedness for 3 days. History of atrial fibrillation, COPD. EXAM: CHEST  2 VIEW COMPARISON:  Chest radiograph March 28, 2017 inch chest radiograph November 27, 2015 FINDINGS: Cardiac silhouette is moderately enlarged and unchanged. Mediastinal silhouette is nonsuspicious. Status post median sternotomy for CABG. Calcified aortic knob. Rounded RIGHT lung base pleural density increased from prior CT. Mild chronic bronchitic changes, increased lung volumes with bibasilar scarring. No pneumothorax. Osteopenia. IMPRESSION: Increasing pleural-based suspected loculated pleural effusion though, mass not excluded. Recommend CT chest with contrast. Stable cardiomegaly and COPD with bibasilar atelectasis/ scarring. Electronically Signed   By: Elon Alas M.D.   On: 04/10/2017 22:33   Dg Chest 2 View  Result Date: 03/28/2017 CLINICAL DATA:  Cough congestion and shortness of breath EXAM: CHEST  2 VIEW COMPARISON:  11/27/2015 FINDINGS: Post sternotomy changes. Small bilateral pleural effusions. Peripheral right lower lung opacity. Streaky atelectasis  or mild infiltrate at the left base as well. Borderline cardiomegaly with atherosclerosis. No pneumothorax. Degenerative changes of the spine. IMPRESSION: 1. Small bilateral effusions with peripherally based opacity in the right lower lung, possibly due to infiltrate or loculated pleural fluid. Streaky atelectasis or infiltrate at the left base. Imaging follow-up to resolution is recommended. 2. Borderline cardiomegaly  Electronically Signed   By: Donavan Foil M.D.   On: 03/28/2017 22:59   Ct Chest Wo Contrast  Result Date: 04/12/2017 CLINICAL DATA:  Inpatient.  Chronic productive cough. EXAM: CT CHEST WITHOUT CONTRAST TECHNIQUE: Multidetector CT imaging of the chest was performed following the standard protocol without IV contrast. COMPARISON:  Chest radiograph from one day prior. 03/02/2013 chest CT. FINDINGS: Cardiovascular: Mild cardiomegaly. No significant pericardial fluid/thickening. Aortic valvular prosthesis is in place. Left main, left anterior descending, left circumflex and right coronary atherosclerosis status post CABG. Atherosclerotic thoracic aorta with 4.3 cm ectatic ascending thoracic aorta. Top-normal caliber main pulmonary artery (3.1 cm diameter). Mediastinum/Nodes: No discrete thyroid nodules. Unremarkable esophagus. No axillary adenopathy. Mild right paratracheal adenopathy measuring up to 1.0 cm (series 3/ image 29). No additional pathologically enlarged mediastinal or gross hilar nodes on this noncontrast scan. Lungs/Pleura: No pneumothorax. Calcified pleural plaque in the medial posterior right pleural space. No left-sided calcified pleural plaques. There are small dependent bilateral pleural effusions, probably loculated bilaterally with small loculated component in the superior portion of the right major fissure (series 3/image 80). Mild paraseptal emphysema. Mild-to-moderate compressive atelectasis in the bilateral lower lobes. No lung masses or significant pulmonary nodules in the aerated portions of the lungs. Upper abdomen: Small hiatal hernia. Liver surface appears finely irregular, cannot exclude cirrhosis. Small volume ascites in the visualized upper abdomen. Multiple simple appearing renal cysts in the upper kidneys bilaterally, largest 5.7 cm in the posterior upper right kidney. Musculoskeletal: No aggressive appearing focal osseous lesions. Moderate thoracic spondylosis. Sternotomy wires  appear intact. Moderate anasarca. IMPRESSION: 1. Right-sided calcified pleural plaque, which is nonspecific with the differential including asbestos related pleural disease (usually bilateral) versus remote pleural insult (trauma or infection). 2. Small dependent bilateral pleural effusions, probably loculated bilaterally with small loculated component in the superior right major fissure. 3. Mild cardiomegaly. 4. Possible cirrhosis.  Small volume upper abdominal ascites. 5. Moderate anasarca. 6. 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 Recommend annual imaging followup by CTA or MRA. This recommendation follows 2010 ACCF/AHA/AATS/ACR/ASA/SCA/SCAI/SIR/STS/SVM Guidelines for the Diagnosis and Management of Patients with Thoracic Aortic Disease. Circulation. 2010; 121: B284-X324. 7. Mild mediastinal lymphadenopathy, nonspecific, probably reactive. 8. Small hiatal hernia. Aortic Atherosclerosis (ICD10-I70.0) and Emphysema (ICD10-J43.9). Electronically Signed   By: Ilona Sorrel M.D.   On: 04/12/2017 09:21   Ir Fluoro Guide Cv Line Right  Result Date: 04/12/2017 INDICATION: Acute on chronic renal failure, no current access for dialysis EXAM: ULTRASOUND GUIDANCE FOR VASCULAR ACCESS RIGHT INTERNAL JUGULAR PERMANENT HEMODIALYSIS CATHETER Date:  6/26/20186/26/2018 3:00 pm Radiologist:  M. Daryll Brod, MD Guidance:  Ultrasound and fluoroscopic FLUOROSCOPY TIME:  Fluoroscopy Time: 43 seconds (10 mGy). MEDICATIONS: Ancef 2 g administered within 1 hour of the procedure ANESTHESIA/SEDATION: Versed 0.5 mg IV; Fentanyl 12.5 mcg IV; Moderate Sedation Time:  26 minutes The patient was continuously monitored during the procedure by the interventional radiology nurse under my direct supervision. CONTRAST:  None. COMPLICATIONS: None immediate. PROCEDURE: Informed consent was obtained from the patient following explanation of the procedure, risks, benefits and alternatives. The patient understands, agrees and consents for the  procedure. All questions were addressed. A  time out was performed. Maximal barrier sterile technique utilized including caps, mask, sterile gowns, sterile gloves, large sterile drape, hand hygiene, and 2% chlorhexidine scrub. Under sterile conditions and local anesthesia, right internal jugular micropuncture venous access was performed with ultrasound. Images were obtained for documentation. A guide wire was inserted followed by a transitional dilator. Next, a 0.035 guidewire was advanced into the IVC with a 5-French catheter. Measurements were obtained from the right venotomy site to the proximal right atrium. In the right infraclavicular chest, a subcutaneous tunnel was created under sterile conditions and local anesthesia. 1% lidocaine with epinephrine was utilized for this. The 23 cm tip to cuff palindrome catheter was tunneled subcutaneously to the venotomy site and inserted into the SVC/RA junction through a valved peel-away sheath. Position was confirmed with fluoroscopy. Images were obtained for documentation. Blood was aspirated from the catheter followed by saline and heparin flushes. The appropriate volume and strength of heparin was instilled in each lumen. Caps were applied. The catheter was secured at the tunnel site with Gelfoam and a pursestring suture. The venotomy site was closed with subcuticular Vicryl suture. Dermabond was applied to the small right neck incision. A dry sterile dressing was applied. The catheter is ready for use. No immediate complications. IMPRESSION: Ultrasound and fluoroscopically guided right internal jugular tunneled hemodialysis catheter (23 cm tip to cuff palindrome catheter). Electronically Signed   By: Jerilynn Mages.  Shick M.D.   On: 04/12/2017 15:08   Ir US Guide Vasc Access Right  Result Date: 04/12/2017 INDICATION: Acute on chronic renal failure, no current access for dialysis EXAM: ULTRASOUND GUIDANCE FOR VASCULAR ACCESS RIGHT INTERNAL JUGULAR PERMANENT HEMODIALYSIS  CATHETER Date:  6/26/20186/26/2018 3:00 pm Radiologist:  M. Daryll Brod, MD Guidance:  Ultrasound and fluoroscopic FLUOROSCOPY TIME:  Fluoroscopy Time: 43 seconds (10 mGy). MEDICATIONS: Ancef 2 g administered within 1 hour of the procedure ANESTHESIA/SEDATION: Versed 0.5 mg IV; Fentanyl 12.5 mcg IV; Moderate Sedation Time:  26 minutes The patient was continuously monitored during the procedure by the interventional radiology nurse under my direct supervision. CONTRAST:  None. COMPLICATIONS: None immediate. PROCEDURE: Informed consent was obtained from the patient following explanation of the procedure, risks, benefits and alternatives. The patient understands, agrees and consents for the procedure. All questions were addressed. A time out was performed. Maximal barrier sterile technique utilized including caps, mask, sterile gowns, sterile gloves, large sterile drape, hand hygiene, and 2% chlorhexidine scrub. Under sterile conditions and local anesthesia, right internal jugular micropuncture venous access was performed with ultrasound. Images were obtained for documentation. A guide wire was inserted followed by a transitional dilator. Next, a 0.035 guidewire was advanced into the IVC with a 5-French catheter. Measurements were obtained from the right venotomy site to the proximal right atrium. In the right infraclavicular chest, a subcutaneous tunnel was created under sterile conditions and local anesthesia. 1% lidocaine with epinephrine was utilized for this. The 23 cm tip to cuff palindrome catheter was tunneled subcutaneously to the venotomy site and inserted into the SVC/RA junction through a valved peel-away sheath. Position was confirmed with fluoroscopy. Images were obtained for documentation. Blood was aspirated from the catheter followed by saline and heparin flushes. The appropriate volume and strength of heparin was instilled in each lumen. Caps were applied. The catheter was secured at the tunnel site  with Gelfoam and a pursestring suture. The venotomy site was closed with subcuticular Vicryl suture. Dermabond was applied to the small right neck incision. A dry sterile dressing was applied. The catheter is  ready for use. No immediate complications. IMPRESSION: Ultrasound and fluoroscopically guided right internal jugular tunneled hemodialysis catheter (23 cm tip to cuff palindrome catheter). Electronically Signed   By: Jerilynn Mages.  Shick M.D.   On: 04/12/2017 15:08   Dg Chest Port 1 View  Result Date: 04/16/2017 CLINICAL DATA:  76 year old male with shortness of breath EXAM: PORTABLE CHEST 1 VIEW COMPARISON:  Prior chest x-ray 04/10/2017; prior chest CT 04/11/2017 FINDINGS: Right IJ tunneled hemodialysis catheter. The catheter tip projects over the mid right atrium. Stable cardiomegaly. 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 Atherosclerotic calcifications again noted in the transverse aorta. There is a pulmonary vascular congestion bordering on mild edema. The previously noted loculated pleural fluid is no longer evident. No acute osseous abnormality. IMPRESSION: 1. Pulmonary vascular congestion with mild interstitial edema. 2. Bilateral layering pleural effusions. 3. Stable cardiomegaly. 4.  Aortic Atherosclerosis (ICD10-170.0) 5. Right IJ tunneled hemodialysis catheter in good position with the tip overlying the mid right atrium. Electronically Signed   By: Jacqulynn Cadet M.D.   On: 04/16/2017 10:53    Time Spent in minutes  30   Jani Gravel M.D on 04/20/2017 at 7:32 AM  Between 7am to 7pm - Pager - [phone]  After 7pm go to [email or web address] - password [facility]  [facility] -  Office  [phone]

## 2017-04-20 NOTE — Progress Notes (Signed)
Patient ID: Robert Stanton, male   DOB: 02/17/1941, 76 y.o.   MRN: [id number] S:No new complaints, although somewhat confused this morning O:BP (!) 103/51 (BP Location: Right Arm)   Pulse (!) 50   Temp 98.5 F (36.9 C) (Axillary)   Resp 14   Ht 5\' 8"  (1.727 m)   Wt 106.2 kg (234 lb 2.1 oz)   SpO2 95%   BMI 35.60 kg/m   Intake/Output Summary (Last 24 hours) at 04/20/17 1040 Last data filed at 04/19/17 1903  Gross per 24 hour  Intake              240 ml  Output             2000 ml  Net            -1760 ml   Intake/Output: I/O last 3 completed shifts: In: 613.8 [P.O.:480; I.V.:133.8] Out: 2000 [Other:2000]  Intake/Output this shift:  No intake/output data recorded. Weight change:  FGH:WEXHB elderly WM in nad CVS:no rub Resp:cta ZJI:RCVELF YBO:FBPZW edema, LUE AVF +T/B   Recent Labs Lab 04/14/17 0623 04/15/17 0525 04/16/17 0306 04/17/17 1248 04/18/17 0742 04/19/17 0045 04/20/17 0602  NA 131* 134* 135 135 133* 132* 133*  K 4.3 3.9 4.2 4.9 4.6 4.7 4.2  CL 93* 93* 96* 95* 94* 95* 94*  CO2 21* 25 25 25 25 25 26   GLUCOSE 107* 101* 113* 115* 89 105* 100*  BUN 111* 73* 43* 68* 40* 54* 37*  CREATININE 10.21* 8.23* 6.06* 8.22* 5.35* 6.33* 4.83*  ALBUMIN 2.9* 3.0* 3.0* 3.0* 3.1* 2.9*  3.0* 2.8*  CALCIUM 8.3* 8.5* 8.6* 9.0 8.6* 8.9 8.6*  PHOS  --   --   --   --   --  7.2*  --   AST 126* 105* 106* 99* 105* 113* 310*  ALT 39 28 25 24 26 28  67*   Liver Function Tests:  Recent Labs Lab 04/18/17 0742 04/19/17 0045 04/20/17 0602  AST 105* 113* 310*  ALT 26 28 67*  ALKPHOS 67 66 82  BILITOT 5.8* 6.5* 8.7*  PROT 7.2 7.4 7.2  ALBUMIN 3.1* 2.9*  3.0* 2.8*   No results for input(s): LIPASE, AMYLASE in the last 168 hours. No results for input(s): AMMONIA in the last 168 hours. CBC:  Recent Labs Lab 04/17/17 2006 04/18/17 0042 04/18/17 0742 04/19/17 0045 04/19/17 0729 04/20/17 0602  WBC 8.4 8.8 9.2 9.4  --  10.4  HGB 8.8* 8.8* 8.7* 8.6* 8.8* 8.5*  HCT 26.3*  25.8* 26.8* 25.7* 26.4* 26.3*  MCV 97.8 97.4 100.0 99.2  --  100.8*  PLT 116* 100* 107* 102*  --  92*   Cardiac Enzymes: No results for input(s): CKTOTAL, CKMB, CKMBINDEX, TROPONINI in the last 168 hours. CBG: No results for input(s): GLUCAP in the last 168 hours.  Iron Studies: No results for input(s): IRON, TIBC, TRANSFERRIN, FERRITIN in the last 72 hours. Studies/Results: No results found. [name] albuterol  2.5 mg Nebulization TID  . calcium acetate  1,334 mg Oral TID WC  . darbepoetin (ARANESP) injection - DIALYSIS  60 mcg Intravenous Q Tue-HD  . levothyroxine  50 mcg Oral QAC breakfast  . midodrine  5 mg Oral BID WC  . montelukast  10 mg Oral QHS  . multivitamin  1 tablet Oral QHS  . pantoprazole (PROTONIX) IV  40 mg Intravenous Q12H  . sucralfate  1 g Oral Q6H    BMET    Component Value Date/Time  NA 133 (L) 04/20/2017 0602   K 4.2 04/20/2017 0602   CL 94 (L) 04/20/2017 0602   CO2 26 04/20/2017 0602   GLUCOSE 100 (H) 04/20/2017 0602   BUN 37 (H) 04/20/2017 0602   CREATININE 4.83 (H) 04/20/2017 0602   CREATININE 6.57 (H) 04/05/2017 1554   CALCIUM 8.6 (L) 04/20/2017 0602   GFRNONAA 11 (L) 04/20/2017 0602   GFRNONAA 8 (L) 04/05/2017 1554   GFRAA 12 (L) 04/20/2017 0602   GFRAA 9 (L) 04/05/2017 1554   CBC    Component Value Date/Time   WBC 10.4 04/20/2017 0602   RBC 2.61 (L) 04/20/2017 0602   HGB 8.5 (L) 04/20/2017 0602   HCT 26.3 (L) 04/20/2017 0602   PLT 92 (L) 04/20/2017 0602   MCV 100.8 (H) 04/20/2017 0602   MCH 32.6 04/20/2017 0602   MCHC 32.3 04/20/2017 0602   RDW 26.3 (H) 04/20/2017 0602   LYMPHSABS 915 04/05/2017 1554   MONOABS 732 04/05/2017 1554   EOSABS 183 04/05/2017 1554   BASOSABS 0 04/05/2017 1554    Assessment/Plan:  1. New ESRD. Continue with HD qTTS and is set up for [facility] once stable for discharge, unfortunately he is not strong enough for outpatient dialysis at this time.  He will need to be able to stand and transfer to a recliner for  dialysis.  I am concerned about his overall prognosis due to his poor functional status.  He is not maiing any progress with his strength and has not been out of bed.  He is not suitable for outpatient dialysis at this time and needs to make considerable improvement before he can be discharged. 2. UGI bleed- appreciate GI input, pan gastritis 3. CHF/CMP- UF as BP tolerates 4. Anemia of CKD/ABLA- due to GIB, start aranesp with HD 5. Gastric AVMs 6. Vascular access- LUE AVF +T/B, placed 03/04/17 7. Deconditioning- per primary 8. A fib 9. S/p AVR 10. OSA on CPAP 11. Disposition- he is not ready for discharge for outpatient dialysis due to his severe deconditioning.   He will need to be able to stand and transfer to recliner for dialysis before he can be discharged. I would recommend palliative care consult to help set goals/limits of care as his prognosis is not good in light of his poor functional and nutritional status.   Donetta Potts, MD [employer] [phone]

## 2017-04-20 NOTE — Progress Notes (Signed)
Cross cover [facility]-GI Subjective: Patient denies having any active GI issues at this time he denies having abdominal pain nausea vomiting melena or hematochezia. I've had a detailed discussion about him with Dr. Rhea Pink about his need for palliative care  Objective: Vital signs in last 24 hours: Temp:  [97.4 F (36.3 C)-99.4 F (37.4 C)] 98.5 F (36.9 C) (07/04 0750) Pulse Rate:  [50-81] 50 (07/04 0750) Resp:  [14-19] 14 (07/04 0750) BP: (86-107)/(39-52) 103/51 (07/04 0750) SpO2:  [92 %-98 %] 95 % (07/04 0859) Last BM Date: 04/19/17  Intake/Output from previous day: 07/03 0701 - 07/04 0700 In: 480 [P.O.:480] Out: 2000  Intake/Output this shift: No intake/output data recorded.  General appearance: fatigued and slowed mentation Resp: clear to auscultation bilaterally Cardio: regular rate and rhythm, S1, S2 normal, no murmur, click, rub or gallop GI: soft, non-tender; bowel sounds normal; no masses,  no organomegaly Extremities: edema 1+  Lab Results:  Recent Labs  04/18/17 0742 04/19/17 0045 04/19/17 0729 04/20/17 0602  WBC 9.2 9.4  --  10.4  HGB 8.7* 8.6* 8.8* 8.5*  HCT 26.8* 25.7* 26.4* 26.3*  PLT 107* 102*  --  92*   BMET  Recent Labs  04/18/17 0742 04/19/17 0045 04/20/17 0602  NA 133* 132* 133*  K 4.6 4.7 4.2  CL 94* 95* 94*  CO2 25 25 26   GLUCOSE 89 105* 100*  BUN 40* 54* 37*  CREATININE 5.35* 6.33* 4.83*  CALCIUM 8.6* 8.9 8.6*   LFT  Recent Labs  04/19/17 0045 04/20/17 0602  PROT 7.4 7.2  ALBUMIN 2.9*  3.0* 2.8*  AST 113* 310*  ALT 28 67*  ALKPHOS 66 82  BILITOT 6.5* 8.7*  BILIDIR 3.5*  --   IBILI 3.0*  --    PT/INR  Recent Labs  04/19/17 2014 04/20/17 0602  LABPROT 27.7* 26.6*  INR 2.52 2.40   Medications: I have reviewed the patient's current medications.  Assessment/Plan: GIB/Anemia/gastric AVM's-patient has been made DO NOT RESUSCITATE. I will sign off please call if further assistance is needed.  LOS: 10 days    Parsa Rickett 04/20/2017, 9:40 AM

## 2017-04-20 NOTE — Progress Notes (Signed)
Saw patient when giving breathing treatment and pt stated he did not want to wear CPAP tonight. RT told patient that we would check back later and see if he changed his mind since he has worn it the previous couple nights. Went back to check patient at 00:35 and patient still refused. Patient is on nasal cannula and RN was made aware. CPAP in room.

## 2017-04-20 NOTE — Care Management Important Message (Signed)
Important Message  Patient Details  Name: Robert Stanton MRN: [id number] Date of Birth: [birth year]   Medicare Important Message Given:  Yes    Nathen May 04/20/2017, 9:34 AM

## 2017-04-20 NOTE — Progress Notes (Signed)
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  Labs:  Recent Labs  04/17/17 1248  04/18/17 0742 04/19/17 0045 04/19/17 0729 04/19/17 2014 04/20/17 0602  HGB 8.6*  < > 8.7* 8.6* 8.8*  --  8.5*  HCT 26.7*  < > 26.8* 25.7* 26.4*  --  26.3*  PLT 128*  < > 107* 102*  --   --  92*  LABPROT  --   < > 21.6* 28.1*  --  27.7* 26.6*  INR  --   < > 1.85 2.57  --  2.52 2.40  HEPARINUNFRC  --   --   --  0.17*  --   --   --   CREATININE 8.22*  --  5.35* 6.33*  --   --   --   < > = values in this interval not displayed.   Assessment: 76yo male to transition to heparin now that INR is below goal.  Goal of Therapy:  Heparin level 0.3-0.5 units/ml   Plan:  Will resume heparin gtt at 1250 units/hr (previously therapeutic at this rate) and check heparin level in [place], PharmD, BCPS  04/20/2017,6:48 AM

## 2017-04-20 NOTE — Progress Notes (Signed)
Physical Therapy Treatment Patient Details Name: Robert Stanton MRN: [id number] DOB: [birth year] Today's Date: 04/20/2017    History of Present Illness Pt admitted with GIB, anemia. Started on HD 04/12/17.  PMH: CKD, chronic anemia, CAD, CABG, mechanical aortic valve, COPD, CHF, DM, afib.     PT Comments    Pt limited by cardiovascular complications with mobility today. Pt modAx2 for bed mobility to come to EoB. Pt sat 2 minutes EoB and was getting ready to stand when he had a run of PVCs. Pt became pale and asked to lay back down. As medical condition improves, pt requires skilled PT to progress bed mobility and transfers to safely mobilize in his discharge environment.    Follow Up Recommendations  SNF     Equipment Recommendations  None recommended by PT    Recommendations for Other Services       Precautions / Restrictions Precautions Precautions: Fall Restrictions Weight Bearing Restrictions: No    Mobility  Bed Mobility Overal bed mobility: Needs Assistance Bed Mobility: Rolling;Supine to Sit     Supine to sit: +2 for physical assistance;Mod assist     General bed mobility comments: mod A for LE management to floor and trunk to upright vc for reaching R UE to bed rail and assist in pulling towards EoB  Transfers Overall transfer level:  (unable to attempt due to increased in PVCs)                  Ambulation/Gait Ambulation/Gait assistance:  (unable due to dizziness )                 Balance Overall balance assessment: Needs assistance;History of Falls Sitting-balance support: Bilateral upper extremity supported;Feet supported Sitting balance-Leahy Scale: Poor Sitting balance - Comments: able to maintain once modA for placement of feet on floor Postural control: Posterior lean                                  Cognition Arousal/Alertness: Awake/alert Behavior During Therapy: WFL for tasks assessed/performed Overall Cognitive  Status: Difficult to assess                                           General Comments General comments (skin integrity, edema, etc.): in supine BP 106/56, HR 77 bpm, and 95% O2 on 2L O2 via nasal cannula, in sitting BP 112/51, HR  75 bpm and 90%O2, pt getting ready to stand and had a run of PVCs, pt became pale and asked to lay back down, pt was laid back down and nursing notified       Pertinent Vitals/Pain Pain Assessment: No/denies pain           PT Goals (current goals can now be found in the care plan section) Acute Rehab PT Goals PT Goal Formulation: With patient Time For Goal Achievement: 04/27/17 Potential to Achieve Goals: Poor Progress towards PT goals: Not progressing toward goals - comment (mobility limited by medical complications)    Frequency    Min 3X/week      PT Plan Current plan remains appropriate       AM-PAC PT "6 Clicks" Daily Activity  Outcome Measure  Difficulty turning over in bed (including adjusting bedclothes, sheets and blankets)?: Total Difficulty moving from lying on back to sitting on the  side of the bed? : Total Difficulty sitting down on and standing up from a chair with arms (e.g., wheelchair, bedside commode, etc,.)?: Total Help needed moving to and from a bed to chair (including a wheelchair)?: A Lot Help needed walking in hospital room?: Total Help needed climbing 3-5 steps with a railing? : Total 6 Click Score: 7    End of Session Equipment Utilized During Treatment: Gait belt;Oxygen Activity Tolerance: Patient limited by fatigue;Treatment limited secondary to medical complications (Comment) Patient left: in bed;with nursing/sitter in room;with family/visitor present Nurse Communication: Mobility status PT Visit Diagnosis: Unsteadiness on feet (R26.81);Dizziness and giddiness (R42);Muscle weakness (generalized) (M62.81);Other abnormalities of gait and mobility (R26.89)     Time: 1125-1150 PT Time  Calculation (min) (ACUTE ONLY): 25 min  Charges:  $Therapeutic Activity: 23-37 mins                    G Codes:       Primo Innis B. Migdalia Dk PT, DPT Acute Rehabilitation  [phone] Pager [phone]     [place] 04/20/2017, 1:22 PM

## 2017-04-20 NOTE — Progress Notes (Signed)
ANTICOAGULATION CONSULT NOTE - Follow Up Consult  Pharmacy Consult for Heparin Indication: atrial fibrillation and mechanical atrial valve  Patient Measurements: Height: 5\' 8"  (172.7 cm) Weight:  (problem with bed scale ) IBW/kg (Calculated) : 68.4 Heparin Dosing Weight: 93 kg  Vital Signs: Temp: 97.6 F (36.4 C) (07/04 1516) Temp Source: Oral (07/04 1516) BP: 103/51 (07/04 0750) Pulse Rate: 50 (07/04 0750)  Labs:  Recent Labs  04/18/17 0742 04/19/17 0045 04/19/17 0729 04/19/17 2014 04/20/17 0602 04/20/17 1445  HGB 8.7* 8.6* 8.8*  --  8.5*  --   HCT 26.8* 25.7* 26.4*  --  26.3*  --   PLT 107* 102*  --   --  92*  --   LABPROT 21.6* 28.1*  --  27.7* 26.6*  --   INR 1.85 2.57  --  2.52 2.40  --   HEPARINUNFRC  --  0.17*  --   --   --  0.16*  CREATININE 5.35* 6.33*  --   --  4.83*  --     Estimated Creatinine Clearance: 15.8 mL/min (A) (by C-G formula based on SCr of 4.83 mg/dL (H)).  Assessment: 76 yo M admitted 6/24 with rectal bleeding.  On warfarin for anticoagulation PTA for mechanical AVR and afib- now on hold per GI recs with recent GIB.  GI/Cards ok to restart heparin when INR <2.5.  Heparin restarted last night when INR dropped to 2.4. First heparin level today is subtherapeutic at 0.16 units/mL on 1250 units/hr.  CBC stable. No new bleeding noted.  Goal of Therapy: Heparin level 0.3 - 0.5 units/ml INR 2.5-3  Monitor platelets by anticoagulation protocol: Yes   Plan:  Increase heparin to 1400 units/hr 8h heparin level Hold warfarin per GI Daily heparin level, CBC, and INR Monitor s/sx of bleeding   Renesmay Nesbitt D. Karolee Meloni, PharmD, BCPS Clinical Pharmacist 04/20/2017 3:44 PM

## 2017-04-21 ENCOUNTER — Ambulatory Visit: Payer: Self-pay | Admitting: Internal Medicine

## 2017-04-21 DIAGNOSIS — Z515 Encounter for palliative care: Secondary | ICD-10-CM

## 2017-04-21 LAB — COMPREHENSIVE METABOLIC PANEL
ALBUMIN: 3 g/dL — AB (ref 3.5–5.0)
ALK PHOS: 76 U/L (ref 38–126)
ALT: 117 U/L — AB (ref 17–63)
AST: 513 U/L — AB (ref 15–41)
Anion gap: 16 — ABNORMAL HIGH (ref 5–15)
BILIRUBIN TOTAL: 9.4 mg/dL — AB (ref 0.3–1.2)
BUN: 54 mg/dL — AB (ref 6–20)
CALCIUM: 8.8 mg/dL — AB (ref 8.9–10.3)
CO2: 23 mmol/L (ref 22–32)
CREATININE: 6.21 mg/dL — AB (ref 0.61–1.24)
Chloride: 95 mmol/L — ABNORMAL LOW (ref 101–111)
GFR calc Af Amer: 9 mL/min — ABNORMAL LOW (ref >60)
GFR, EST NON AFRICAN AMERICAN: 8 mL/min — AB (ref >60)
GLUCOSE: 94 mg/dL (ref 65–99)
POTASSIUM: 5.1 mmol/L (ref 3.5–5.1)
Sodium: 134 mmol/L — ABNORMAL LOW (ref 135–145)
TOTAL PROTEIN: 7.4 g/dL (ref 6.5–8.1)

## 2017-04-21 LAB — PROTIME-INR
INR: 3.46
Prothrombin Time: 35.7 seconds — ABNORMAL HIGH (ref 11.4–15.2)

## 2017-04-21 LAB — CBC
HEMATOCRIT: 27.8 % — AB (ref 39.0–52.0)
HEMOGLOBIN: 9.1 g/dL — AB (ref 13.0–17.0)
MCH: 33.1 pg (ref 26.0–34.0)
MCHC: 32.7 g/dL (ref 30.0–36.0)
MCV: 101.1 fL — AB (ref 78.0–100.0)
Platelets: 104 10*3/uL — ABNORMAL LOW (ref 150–400)
RBC: 2.75 MIL/uL — ABNORMAL LOW (ref 4.22–5.81)
RDW: 26.7 % — ABNORMAL HIGH (ref 11.5–15.5)
WBC: 10.4 10*3/uL (ref 4.0–10.5)

## 2017-04-21 LAB — HEPARIN LEVEL (UNFRACTIONATED): HEPARIN UNFRACTIONATED: 0.22 [IU]/mL — AB (ref 0.30–0.70)

## 2017-04-21 MED ORDER — GLYCOPYRROLATE 1 MG PO TABS
1.0000 mg | ORAL_TABLET | ORAL | Status: DC | PRN
  Filled 2017-04-21: qty 1

## 2017-04-21 MED ORDER — GLYCOPYRROLATE 0.2 MG/ML IJ SOLN: 0.2000 mg | INTRAMUSCULAR | Status: DC | PRN

## 2017-04-21 MED ORDER — ONDANSETRON 4 MG PO TBDP: 4.0000 mg | ORAL_TABLET | Freq: Four times a day (QID) | ORAL | 0 refills | Status: AC | PRN

## 2017-04-21 MED ORDER — POLYVINYL ALCOHOL 1.4 % OP SOLN
1.0000 [drp] | Freq: Four times a day (QID) | OPHTHALMIC | Status: DC | PRN
  Filled 2017-04-21: qty 15

## 2017-04-21 MED ORDER — HYDROMORPHONE HCL 1 MG/ML IJ SOLN: 0.5000 mg | INTRAMUSCULAR | Status: DC | PRN

## 2017-04-21 MED ORDER — MORPHINE SULFATE 20 MG/5ML PO SOLN: 5.0000 mg | ORAL | 0 refills | Status: AC | PRN

## 2017-04-21 MED ORDER — BISACODYL 10 MG RE SUPP: 10.0000 mg | Freq: Every day | RECTAL | Status: DC | PRN

## 2017-04-21 MED ORDER — HALOPERIDOL LACTATE 2 MG/ML PO CONC
0.5000 mg | ORAL | Status: DC | PRN
  Filled 2017-04-21: qty 0.3

## 2017-04-21 MED ORDER — IPRATROPIUM-ALBUTEROL 0.5-2.5 (3) MG/3ML IN SOLN
3.0000 mL | Freq: Four times a day (QID) | RESPIRATORY_TRACT | Status: DC
  Filled 2017-04-21 (×2): qty 3

## 2017-04-21 MED ORDER — ACETAMINOPHEN 325 MG PO TABS: 650.0000 mg | ORAL_TABLET | Freq: Four times a day (QID) | ORAL | Status: DC | PRN

## 2017-04-21 MED ORDER — ALBUTEROL SULFATE (2.5 MG/3ML) 0.083% IN NEBU: 2.5000 mg | INHALATION_SOLUTION | RESPIRATORY_TRACT | Status: DC | PRN

## 2017-04-21 MED ORDER — ACETAMINOPHEN 650 MG RE SUPP: 650.0000 mg | Freq: Four times a day (QID) | RECTAL | Status: DC | PRN

## 2017-04-21 MED ORDER — HALOPERIDOL 0.5 MG PO TABS
0.5000 mg | ORAL_TABLET | ORAL | Status: DC | PRN
  Filled 2017-04-21: qty 1

## 2017-04-21 MED ORDER — PANTOPRAZOLE SODIUM 40 MG PO TBEC
40.0000 mg | DELAYED_RELEASE_TABLET | Freq: Two times a day (BID) | ORAL | Status: DC
  Administered 2017-04-21: 40 mg via ORAL
  Filled 2017-04-21: qty 1

## 2017-04-21 MED ORDER — DIPHENHYDRAMINE HCL 50 MG/ML IJ SOLN: 12.5000 mg | INTRAMUSCULAR | Status: DC | PRN

## 2017-04-21 MED ORDER — HALOPERIDOL 0.5 MG PO TABS: 0.5000 mg | ORAL_TABLET | ORAL | 1 refills | Status: AC | PRN

## 2017-04-21 MED ORDER — ONDANSETRON 4 MG PO TBDP
4.0000 mg | ORAL_TABLET | Freq: Four times a day (QID) | ORAL | Status: DC | PRN
  Filled 2017-04-21: qty 1

## 2017-04-21 MED ORDER — ONDANSETRON HCL 4 MG/2ML IJ SOLN: 4.0000 mg | Freq: Four times a day (QID) | INTRAMUSCULAR | Status: DC | PRN

## 2017-04-21 MED ORDER — SODIUM CHLORIDE 0.9 % IV SOLN
INTRAVENOUS | Status: DC
  Administered 2017-04-21: 13:00:00 via INTRAVENOUS

## 2017-04-21 MED ORDER — PANTOPRAZOLE SODIUM 40 MG IV SOLR: 40.0000 mg | Freq: Two times a day (BID) | INTRAVENOUS | Status: DC

## 2017-04-21 MED ORDER — HALOPERIDOL LACTATE 5 MG/ML IJ SOLN: 0.5000 mg | INTRAMUSCULAR | Status: DC | PRN

## 2017-04-21 MED ORDER — GLYCOPYRROLATE 1 MG PO TABS: 1.0000 mg | ORAL_TABLET | ORAL | 1 refills | Status: AC | PRN

## 2017-04-21 MED ORDER — BIOTENE DRY MOUTH MT LIQD: 15.0000 mL | OROMUCOSAL | Status: DC | PRN

## 2017-04-21 NOTE — Progress Notes (Signed)
Updated Dr. Maudie Mercury on change in patients condition and stopping of the heparin gtt.  Dr. Maudie Mercury to place orders.  Will continue to monitor.

## 2017-04-21 NOTE — Progress Notes (Signed)
PT Cancellation Note  Patient Details Name: Robert Stanton MRN: [id number] DOB: 03/26/1941   Cancelled Treatment:    Reason Eval/Treat Not Completed: (P) Patient not medically ready Pt with supratherapeutic Heparin level with active rectal bleeding and hypotensive.  Will reattempt.  Vedika Dumlao B. Migdalia Dk PT, DPT Acute Rehabilitation  [phone] Pager [phone]    [place] 04/21/2017, 12:28 PM

## 2017-04-21 NOTE — Clinical Social Work Note (Signed)
CSW facilitated patient discharge including contacting patient family and facility to confirm patient discharge plans. Clinical information faxed to facility and family agreeable with plan. CSW arranged ambulance transport via [facility] to [place]. RN has already called report to facility.  CSW will sign off for now as social work intervention is no longer needed. Please consult [place] again if new needs arise.  Dayton Scrape, [place]

## 2017-04-21 NOTE — Progress Notes (Signed)
ANTICOAGULATION CONSULT NOTE - Follow Up Consult  Pharmacy Consult for Heparin Indication: atrial fibrillation and mechanical atrial valve  Patient Measurements: Height: 5\' 8"  (172.7 cm) Weight:  (problem with bed scale ) IBW/kg (Calculated) : 68.4 Heparin Dosing Weight: 93 kg  Vital Signs: Temp: 97.7 F (36.5 C) (07/05 0731) Temp Source: Oral (07/05 0731) BP: 98/48 (07/05 0731) Pulse Rate: 66 (07/05 0731)  Labs:  Recent Labs  04/19/17 0045 04/19/17 0729 04/19/17 2014 04/20/17 0602 04/20/17 1445 04/20/17 2356 04/21/17 0244  HGB 8.6* 8.8*  --  8.5*  --   --  9.1*  HCT 25.7* 26.4*  --  26.3*  --   --  27.8*  PLT 102*  --   --  92*  --   --  104*  LABPROT 28.1*  --  27.7* 26.6*  --   --  35.7*  INR 2.57  --  2.52 2.40  --   --  3.46  HEPARINUNFRC 0.17*  --   --   --  0.16* 0.22*  --   CREATININE 6.33*  --   --  4.83*  --   --  6.21*    Estimated Creatinine Clearance: 12.3 mL/min (A) (by C-G formula based on SCr of 6.21 mg/dL (H)).  Assessment: CC/HPI: 76 yo M admitted 6/24 with rectal bleeding.    PMH: HTN, CAD s/p CABG 2010, afib, CKD5, esophageal stricture, gout, COPD, OSA, mechanical AVR, hypothyroidism, PAD, DM  Anticoag: warfarin pta dt [employer] AVR (2002) and afib.   Per GI 7/2 - hold warfarin until can diagnose bleed. OK to resume hep when INR <2.5 with goal of 0.3 - 0.5 with no boluses  INR this am 3.46 now with bloody stools so hep now on hold  PTA warfarin 2.5 mg daily (hold Mon/Thu according to prior [facility] Notes)  Renal: CKD > new ERSD. BP issues are interfering with HD. Per renal notes, continue with TTSat - not stable for output HD as of now  Heme/Onc: H&H 9.1/27.8, Plt 104  Goal of Therapy: Heparin level 0.3 - 0.5 units/ml INR 2.5-3  Monitor platelets by anticoagulation protocol: Yes   Plan:  Hold heparin - F/U restart when INR < 2.5 Hold warfarin per GI Daily heparin level, CBC and INR Follow s/s bleeding  Levester Fresh, PharmD, BCPS,  BCCCP Clinical Pharmacist Clinical phone for 04/21/2017 from 7a-3:30p: [phone] If after 3:30p, please call main pharmacy at: [phone] 04/21/2017 9:56 AM

## 2017-04-21 NOTE — Progress Notes (Signed)
Dr. Ardis Hughs updated that patient is having active rectal bleeding.  Will continue to monitor.

## 2017-04-21 NOTE — Progress Notes (Addendum)
Patient ID: Robert Stanton, male   DOB: [birth year], 76 y.o.   MRN: [id number]                                                                PROGRESS NOTE                                                                                                                                                                                                             Patient Demographics:    Robert Stanton, is a 76 y.o. male, DOB - [birth year], YBO:[id number]  Admit date - 04/10/2017   Admitting Physician Ejiroghene Arlyce Dice, MD  Outpatient Primary MD for the patient is Unk Pinto, MD  LOS - 11  Outpatient Specialists:     Chief Complaint  Patient presents with  . Nausea  . Dizziness       Brief Narrative     HPI on 04/10/2017 by Dr. Ralene Muskrat Atkinsonis a 76 y.o.malewith medical history significant for- hypertension, coronary artery disease- CABG- 2010, atrial fibrillation CKD5, hypertension, esophageal strictures, gout, COPD, OSA, mechanical aortic valve, hypothyroidism, peripheral artery disease, type II DM. Patient came in with complaints of bleeding from his rectum was started about 3 days ago, he also reports dark stools- 3-4 days. He denies abdominal pain. In the ED he had an episode of vomiting- with coffee-ground emesis.  He endorses dizziness that started Saturday morning- 04/09/17. This appears to be a chronic issue for patient. Pt denies NSAID use. Patient reports a history of peptic ulcer disease diagnosed with EGD- several years ago. Last EGD 10/2012- was esophagealstricture and erosions, erosive gastritis. Last colonoscopy was about 10 years ago- reports it was normal. He also notes a chronic cough that is productive today, though he has a history of COPD. Hisshortness of breath is at baseline. He denies fever or chills.  Interim history Patient was found to have heme positive stools, with increasing creatinine and elevated INR. He was admitted for  evaluation of GI bleeding. Patient underwent EGD showing AVMs status post hemostatic endoscopy with APC. Nephrology also consulted and hemodialysis initiated. Patient started to have GIBleed again, anticoag held. S/p EGD on 7/2.   Subjective:    [name] today is very deconditioned. 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 hypertension ([place])   Long term current use of anticoagulant therapy   Status post mechanical aortic valve replacement   Generalized anxiety disorder   Hypothyroidism   Dizziness   Chronic kidney disease, stage V (HCC)   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 Esophageal stricture without esophagitis. Moderately large hiatal hernia with [name] erosions. -Continue iron supplementation 325mg  BID -Overnight, patient supposedly had several bloody bowel movements, however hemoglobin currently 8.8 (higher than the past few days) -Status post EGD 04/18/2017=> Nonbleeding Cameron erosions, most notable for moderate to severe pain and gastritis resulting in very friable gastric mucosa. Pathology => focal  tubular adenoma H. Pylori negative Patient started on Carafate, continue PPI twice daily. GI felt heparin IV could be restarted however Coumadin to be held. Heparin restarted 7/2, INR => 2.57  (7/2) -Overnight, patient did have another bloody bowel movement. Hemoglobin currently stable at 9.1 -Pending further recommendations from cardiology as well as GI regarding anticoagulation. 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 However not stable for discharge at this time -Continues to be very volume overloaded Pt appears to be improving from volume standpoint  Dyspnea improving -Possibly secondary to volume overload -continue hemodialysis and supplement O2 as needed  Acute on Chronic systolic heart failure/cardiomyopathy -Echocardiogram EF 35% with hypokinesis worse in the inferior and septal walls -Last echocardiogram 05/20/2016 showed an EF of 45%, basal to mid inferior severe hypokinesis. Indeterminate diastolic function -Patient was given diuresis per nephrology however patient continued to remain oliguric -Continue volume control with HD -Monitor intake and output, daily weights -Cardiology consulted and appreciated. -Patient presently not a candidate for aggressive cardiac evaluation per cardiology     Somnolence -Appears improved. Currently on Cpap -ABG did not show CO2 retention  Right loculated pleural effusion -No signs of an infection at this time -CT chest: Right-sided calcified pleural plaque, nonspecific with differential including asbestosis related pleural disease versus pleural insult such as trauma or infection. Small bilateral pleural effusions, probably loculated bilaterally with small loculated components.  Right major fissure.  Atrial fibrillation/aortic valve replacement -Patient presented with supratherapeutic INR, given vitamin K as well as fresh frozen plasma -Heparin per pharmacy, Coumadin on hold -hemoglobin currently stable    Essential hypertension complicated by ?hypotension -Manual BP stable -Continue midodrine  Diabetes mellitus, type II -Hemoglobin A1c was 5.1 on 03/05/2017  Atherosclerotic thoracic aorta -4.3 cm ectatic ascending thoracic aorta noted on CT scan -Patient will need annual imaging by CTA or MRA  Possible cirrhosis/ elevated AST -Noted on CT chest -AST improving slightly, last105 (per patient and son, no longer consumes alcohol) -Suspect elevated AST secondary to medications -Reviewed medications with pharmacy. One, Synthroid, Protonix, Lasix may all increased LFTs. CRP also increase AST. Lasix has been discontinued. Given that patient currently requires Protonix for GI bleed as well as heparin for AF, will continue to monitor LFTs closely. Typically an AST/ALT >3 indicates ETOH related liver disease Will monitor  Mildly elevated troponin -Likely secondary to the above, currently chest pain-free -Troponin elevation not consistent with ACS -Echocardiogram as above  NSVT -Overnight 04/15/17, had 9beat run of VT -Magnesium 2.4  -Potassium 4.7 -Continue to monitor   Hypothyroidism -Continue Synthroid  Nausea -possibly related to the start of hemodialysis -Currently denies further nausea, Zofran has been ordered  however patient has not used.  Obstructive sleep apnea -Continue CPAP QHS  Severe Protein Calorie Malnutrition Prostat  Goals of care/Code status -Discussed code status with patient, he is not sure if he wants CPR. palliative care  Consulted as patient is at high risk for decompensation given acute CHF exac with new start of HD and recurrent GI bleeding.  -Continue to monitor and stepdown. -Palliative care consulted and appreciated, pending evaluation   DVT Prophylaxisheparin , SCDs  Code Status:Full  Family Communication:None at bedside.  Disposition Plan:Admitted. Continue to monitor in stepdown. Dispo TBD  Consultants Nephrology Gastroenterology Interventional radiology  Cardiology Palliative care  Procedures  EGD x 2 Right IJ HD tunneled catheter placement Echocardiogram        Lab Results  Component Value Date   PLT 104 (L) 04/21/2017     Anti-infectives    Start     Dose/Rate Route Frequency Ordered Stop   04/12/17 1407  ceFAZolin (ANCEF) 2-4 GM/100ML-% IVPB    Comments:  Dhers, Patricia   : cabinet override      04/12/17 1407 04/13/17 0214   04/12/17 1100  ceFAZolin (ANCEF) IVPB 2g/100 mL premix     2 g 200 mL/hr over 30 Minutes Intravenous On call 04/12/17 1019 04/12/17 1443        Objective:   Vitals:   04/20/17 1543 04/20/17 1951 04/20/17 2246 04/21/17 0337  BP:  (!) 100/44 (!) 107/34 (!) 110/49  Pulse:  80 75 72  Resp:  19 16 19   Temp:  97.7 F (36.5 C) 98 F (36.7 C) 97.7 F (36.5 C)  TempSrc:  Oral Oral Oral  SpO2: 90% 94% 94% 98%  Weight:      Height:        Wt Readings from Last 3 Encounters:  04/07/17 109.8 kg (242 lb)  04/05/17 110.4 kg (243 lb 6.4 oz)  03/28/17 111.1 kg (245 lb)     Intake/Output Summary (Last 24 hours) at 04/21/17 0545 Last data filed at 04/21/17 0200  Gross per 24 hour  Intake           339.17 ml  Output  1 ml  Net           338.17 ml     Physical  Exam  Awake Alert, Oriented X 2, No new F.N deficits, Normal affect [place].AT,PERRAL Supple Neck,No JVD, No cervical lymphadenopathy appriciated.  Symmetrical Chest wall movement, Good air movement bilaterally, CTAB RRR,No Gallops,Rubs or new Murmurs, No Parasternal Heave +ve B.Sounds, Abd Soft, No tenderness, No organomegaly appriciated, No rebound - guarding or rigidity. No Cyanosis, Clubbing, trace edema, No new Rash or bruise      Data Review:    CBC  Recent Labs Lab 04/18/17 0042 04/18/17 0742 04/19/17 0045 04/19/17 0729 04/20/17 0602 04/21/17 0244  WBC 8.8 9.2 9.4  --  10.4 10.4  HGB 8.8* 8.7* 8.6* 8.8* 8.5* 9.1*  HCT 25.8* 26.8* 25.7* 26.4* 26.3* 27.8*  PLT 100* 107* 102*  --  92* 104*  MCV 97.4 100.0 99.2  --  100.8* 101.1*  MCH 33.2 32.5 33.2  --  32.6 33.1  MCHC 34.1 32.5 33.5  --  32.3 32.7  RDW 23.3* 23.9* 24.6*  --  26.3* 26.7*    Chemistries   Recent Labs Lab 04/16/17 0734 04/17/17 1248 04/18/17 0742 04/19/17 0045 04/20/17 0602 04/21/17 0244  NA  --  135 133* 132* 133* 134*  K  --  4.9 4.6 4.7 4.2 5.1  CL  --  95* 94* 95* 94* 95*  CO2  --  25 25 25 26 23   GLUCOSE  --  115* 89 105* 100* 94  BUN  --  68* 40* 54* 37* 54*  CREATININE  --  8.22* 5.35* 6.33* 4.83* 6.21*  CALCIUM  --  9.0 8.6* 8.9 8.6* 8.8*  MG 2.4  --   --   --   --   --   AST  --  99* 105* 113* 310* 513*  ALT  --  24 26 28  67* 117*  ALKPHOS  --  67 67 66 82 76  BILITOT  --  5.0* 5.8* 6.5* 8.7* 9.4*   ------------------------------------------------------------------------------------------------------------------ No results for input(s): CHOL, HDL, LDLCALC, TRIG, CHOLHDL, LDLDIRECT in the last 72 hours.  Lab Results  Component Value Date   HGBA1C 5.1 03/15/2017   ------------------------------------------------------------------------------------------------------------------ No results for input(s): TSH, T4TOTAL, T3FREE, THYROIDAB in the last 72 hours.  Invalid input(s):  FREET3 ------------------------------------------------------------------------------------------------------------------ No results for input(s): VITAMINB12, FOLATE, FERRITIN, TIBC, IRON, RETICCTPCT in the last 72 hours.  Coagulation profile  Recent Labs Lab 04/18/17 0742 04/19/17 0045 04/19/17 2014 04/20/17 0602 04/21/17 0244  INR 1.85 2.57 2.52 2.40 3.46    No results for input(s): DDIMER in the last 72 hours.  Cardiac Enzymes No results for input(s): CKMB, TROPONINI, MYOGLOBIN in the last 168 hours.  Invalid input(s): CK ------------------------------------------------------------------------------------------------------------------    Component Value Date/Time   BNP 337.5 (H) 10/25/2016 1539    Inpatient Medications  Scheduled Meds: . albuterol  2.5 mg Nebulization TID  . calcium acetate  1,334 mg Oral TID WC  . darbepoetin (ARANESP) injection - DIALYSIS  60 mcg Intravenous Q Tue-HD  . levothyroxine  50 mcg Oral QAC breakfast  . midodrine  5 mg Oral BID WC  . montelukast  10 mg Oral QHS  . multivitamin  1 tablet Oral QHS  . pantoprazole (PROTONIX) IV  40 mg Intravenous Q12H  . sucralfate  1 g Oral Q6H   Continuous Infusions: . heparin 1,500 Units/hr (04/21/17 0127)   PRN Meds:.ipratropium-albuterol, ondansetron (ZOFRAN) IV  Micro Results Recent Results (from the past  240 hour(s))  MRSA PCR Screening     Status: None   Collection Time: 04/17/17  2:31 PM  Result Value Ref Range Status   MRSA by PCR NEGATIVE NEGATIVE Final    Comment:        The GeneXpert MRSA Assay (FDA approved for NASAL specimens only), is one component of a comprehensive MRSA colonization surveillance program. It is not intended to diagnose MRSA infection nor to guide or monitor treatment for MRSA infections.     Radiology Reports Ct Abdomen Pelvis Wo Contrast  Result Date: 04/10/2017 CLINICAL DATA:  Abdominal pain. History of renal cysts, abdominal aortic aneurysm, hiatal  hernia, chronic kidney disease. EXAM: CT ABDOMEN AND PELVIS WITHOUT CONTRAST TECHNIQUE: Multidetector CT imaging of the abdomen and pelvis was performed following the standard protocol without IV contrast. COMPARISON:  CT abdomen and pelvis September 26, 2016 FINDINGS: LOWER CHEST: New small to moderate pleural effusions with suspected loculation on the RIGHT. The heart is mildly enlarged. At least mild coronary artery calcifications versus stent. HEPATOBILIARY: Dense liver seen with amiodarone use, slightly nodular contour with atrophy. Normal noncontrast CT gallbladder. PANCREAS: Normal. SPLEEN: Normal. ADRENALS/URINARY TRACT: Kidneys are orthotopic,, mildly atrophic LEFT kidney kidneys are orthotopic. Bilateral renal cysts measuring to 5.7 cm upper pole RIGHT kidney. 2 mm RIGHT lower pole nephrolithiasis. Bilateral vascular calcifications. Kicked No hydronephrosis; limited assessment for renal masses on this nonenhanced examination. The unopacified ureters are normal in course and caliber. Urinary bladder is partially distended and unremarkable. Stable 18 mm benign LEFT adrenal adenoma (-22 Hounsfield units). STOMACH/BOWEL: Small hiatal hernia. The stomach, small and large bowel are normal in course and caliber without inflammatory changes. Moderate sigmoid diverticulosis. The appendix is not discretely identified, however there are no inflammatory changes in the right lower quadrant. 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 Severe calcific atherosclerosis. Status post fem-fem bypass. REPRODUCTIVE: Normal. OTHER: Moderate volume ascites.  No intraperitoneal free air. MUSCULOSKELETAL: Anasarca. Multiple subcentimeter nodules anterior abdominal wall subcutaneous fat, possible granulomas. Small bilateral fat containing inguinal hernias. Surgical clips RIGHT inguinal soft tissues compatible with prior vascular access. 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  Anasarca. Nonobstructing 2 mm RIGHT lower pole, bilateral renal vascular calcifications. New small to moderate pleural effusions with suspected loculation on the RIGHT. Severe atherosclerosis. Electronically Signed   By: Elon Alas M.D.   On: 04/10/2017 22:47   Dg Chest 2 View  Result Date: 04/10/2017 CLINICAL DATA:  Nausea and lightheadedness for 3 days. History of atrial fibrillation, COPD. EXAM: CHEST  2 VIEW COMPARISON:  Chest radiograph March 28, 2017 inch chest radiograph November 27, 2015 FINDINGS: Cardiac silhouette is moderately enlarged and unchanged. Mediastinal silhouette is nonsuspicious. Status post median sternotomy for CABG. Calcified aortic knob. Rounded RIGHT lung base pleural density increased from prior CT. Mild chronic bronchitic changes, increased lung volumes with bibasilar scarring. No pneumothorax. Osteopenia. IMPRESSION: Increasing pleural-based suspected loculated pleural effusion though, mass not excluded. Recommend CT chest with contrast. Stable cardiomegaly and COPD with bibasilar atelectasis/ scarring. Electronically Signed   By: Elon Alas M.D.   On: 04/10/2017 22:33   Dg Chest 2 View  Result Date: 03/28/2017 CLINICAL DATA:  Cough congestion and shortness of breath EXAM: CHEST  2 VIEW COMPARISON:  11/27/2015 FINDINGS: Post sternotomy changes. Small bilateral pleural effusions. Peripheral right lower lung opacity. Streaky atelectasis or mild infiltrate at the left base  as well. Borderline cardiomegaly with atherosclerosis. No pneumothorax. Degenerative changes of the spine. IMPRESSION: 1. Small bilateral effusions with peripherally based opacity in the right lower lung, possibly due to infiltrate or loculated pleural fluid. Streaky atelectasis or infiltrate at the left base. Imaging follow-up to resolution is recommended. 2. Borderline  cardiomegaly Electronically Signed   By: Donavan Foil M.D.   On: 03/28/2017 22:59   Ct Chest Wo Contrast  Result Date: 04/12/2017 CLINICAL DATA:  Inpatient.  Chronic productive cough. EXAM: CT CHEST WITHOUT CONTRAST TECHNIQUE: Multidetector CT imaging of the chest was performed following the standard protocol without IV contrast. COMPARISON:  Chest radiograph from one day prior. 03/02/2013 chest CT. FINDINGS: Cardiovascular: Mild cardiomegaly. No significant pericardial fluid/thickening. Aortic valvular prosthesis is in place. Left main, left anterior descending, left circumflex and right coronary atherosclerosis status post CABG. Atherosclerotic thoracic aorta with 4.3 cm ectatic ascending thoracic aorta. Top-normal caliber main pulmonary artery (3.1 cm diameter). Mediastinum/Nodes: No discrete thyroid nodules. Unremarkable esophagus. No axillary adenopathy. Mild right paratracheal adenopathy measuring up to 1.0 cm (series 3/ image 29). No additional pathologically enlarged mediastinal or gross hilar nodes on this noncontrast scan. Lungs/Pleura: No pneumothorax. Calcified pleural plaque in the medial posterior right pleural space. No left-sided calcified pleural plaques. There are small dependent bilateral pleural effusions, probably loculated bilaterally with small loculated component in the superior portion of the right major fissure (series 3/image 80). Mild paraseptal emphysema. Mild-to-moderate compressive atelectasis in the bilateral lower lobes. No lung masses or significant pulmonary nodules in the aerated portions of the lungs. Upper abdomen: Small hiatal hernia. Liver surface appears finely irregular, cannot exclude cirrhosis. Small volume ascites in the visualized upper abdomen. Multiple simple appearing renal cysts in the upper kidneys bilaterally, largest 5.7 cm in the posterior upper right kidney. Musculoskeletal: No aggressive appearing focal osseous lesions. Moderate thoracic spondylosis.  Sternotomy wires appear intact. Moderate anasarca. IMPRESSION: 1. Right-sided calcified pleural plaque, which is nonspecific with the differential including asbestos related pleural disease (usually bilateral) versus remote pleural insult (trauma or infection). 2. Small dependent bilateral pleural effusions, probably loculated bilaterally with small loculated component in the superior right major fissure. 3. Mild cardiomegaly. 4. Possible cirrhosis.  Small volume upper abdominal ascites. 5. Moderate anasarca. 6. 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 Recommend annual imaging followup by CTA or MRA. This recommendation follows 2010 ACCF/AHA/AATS/ACR/ASA/SCA/SCAI/SIR/STS/SVM Guidelines for the Diagnosis and Management of Patients with Thoracic Aortic Disease. Circulation. 2010; 121: K270-W237. 7. Mild mediastinal lymphadenopathy, nonspecific, probably reactive. 8. Small hiatal hernia. Aortic Atherosclerosis (ICD10-I70.0) and Emphysema (ICD10-J43.9). Electronically Signed   By: Ilona Sorrel M.D.   On: 04/12/2017 09:21   Ir Fluoro Guide Cv Line Right  Result Date: 04/12/2017 INDICATION: Acute on chronic renal failure, no current access for dialysis EXAM: ULTRASOUND GUIDANCE FOR VASCULAR ACCESS RIGHT INTERNAL JUGULAR PERMANENT HEMODIALYSIS CATHETER Date:  6/26/20186/26/2018 3:00 pm Radiologist:  M. Daryll Brod, MD Guidance:  Ultrasound and fluoroscopic FLUOROSCOPY TIME:  Fluoroscopy Time: 43 seconds (10 mGy). MEDICATIONS: Ancef 2 g administered within 1 hour of the procedure ANESTHESIA/SEDATION: Versed 0.5 mg IV; Fentanyl 12.5 mcg IV; Moderate Sedation Time:  26 minutes The patient was continuously monitored during the procedure by the interventional radiology nurse under my direct supervision. CONTRAST:  None. COMPLICATIONS: None immediate. PROCEDURE: Informed consent was obtained from the patient following explanation of the procedure, risks, benefits and alternatives. The patient understands, agrees and  consents for the procedure. All questions were addressed. A time out was performed. Maximal barrier sterile  technique utilized including caps, mask, sterile gowns, sterile gloves, large sterile drape, hand hygiene, and 2% chlorhexidine scrub. Under sterile conditions and local anesthesia, right internal jugular micropuncture venous access was performed with ultrasound. Images were obtained for documentation. A guide wire was inserted followed by a transitional dilator. Next, a 0.035 guidewire was advanced into the IVC with a 5-French catheter. Measurements were obtained from the right venotomy site to the proximal right atrium. In the right infraclavicular chest, a subcutaneous tunnel was created under sterile conditions and local anesthesia. 1% lidocaine with epinephrine was utilized for this. The 23 cm tip to cuff palindrome catheter was tunneled subcutaneously to the venotomy site and inserted into the SVC/RA junction through a valved peel-away sheath. Position was confirmed with fluoroscopy. Images were obtained for documentation. Blood was aspirated from the catheter followed by saline and heparin flushes. The appropriate volume and strength of heparin was instilled in each lumen. Caps were applied. The catheter was secured at the tunnel site with Gelfoam and a pursestring suture. The venotomy site was closed with subcuticular Vicryl suture. Dermabond was applied to the small right neck incision. A dry sterile dressing was applied. The catheter is ready for use. No immediate complications. IMPRESSION: Ultrasound and fluoroscopically guided right internal jugular tunneled hemodialysis catheter (23 cm tip to cuff palindrome catheter). Electronically Signed   By: Jerilynn Mages.  Shick M.D.   On: 04/12/2017 15:08   Ir US Guide Vasc Access Right  Result Date: 04/12/2017 INDICATION: Acute on chronic renal failure, no current access for dialysis EXAM: ULTRASOUND GUIDANCE FOR VASCULAR ACCESS RIGHT INTERNAL JUGULAR PERMANENT  HEMODIALYSIS CATHETER Date:  6/26/20186/26/2018 3:00 pm Radiologist:  M. Daryll Brod, MD Guidance:  Ultrasound and fluoroscopic FLUOROSCOPY TIME:  Fluoroscopy Time: 43 seconds (10 mGy). MEDICATIONS: Ancef 2 g administered within 1 hour of the procedure ANESTHESIA/SEDATION: Versed 0.5 mg IV; Fentanyl 12.5 mcg IV; Moderate Sedation Time:  26 minutes The patient was continuously monitored during the procedure by the interventional radiology nurse under my direct supervision. CONTRAST:  None. COMPLICATIONS: None immediate. PROCEDURE: Informed consent was obtained from the patient following explanation of the procedure, risks, benefits and alternatives. The patient understands, agrees and consents for the procedure. All questions were addressed. A time out was performed. Maximal barrier sterile technique utilized including caps, mask, sterile gowns, sterile gloves, large sterile drape, hand hygiene, and 2% chlorhexidine scrub. Under sterile conditions and local anesthesia, right internal jugular micropuncture venous access was performed with ultrasound. Images were obtained for documentation. A guide wire was inserted followed by a transitional dilator. Next, a 0.035 guidewire was advanced into the IVC with a 5-French catheter. Measurements were obtained from the right venotomy site to the proximal right atrium. In the right infraclavicular chest, a subcutaneous tunnel was created under sterile conditions and local anesthesia. 1% lidocaine with epinephrine was utilized for this. The 23 cm tip to cuff palindrome catheter was tunneled subcutaneously to the venotomy site and inserted into the SVC/RA junction through a valved peel-away sheath. Position was confirmed with fluoroscopy. Images were obtained for documentation. Blood was aspirated from the catheter followed by saline and heparin flushes. The appropriate volume and strength of heparin was instilled in each lumen. Caps were applied. The catheter was secured at the  tunnel site with Gelfoam and a pursestring suture. The venotomy site was closed with subcuticular Vicryl suture. Dermabond was applied to the small right neck incision. A dry sterile dressing was applied. The catheter is ready for use. No immediate complications. IMPRESSION:  Ultrasound and fluoroscopically guided right internal jugular tunneled hemodialysis catheter (23 cm tip to cuff palindrome catheter). Electronically Signed   By: Jerilynn Mages.  Shick M.D.   On: 04/12/2017 15:08   Dg Chest Port 1 View  Result Date: 04/16/2017 CLINICAL DATA:  76 year old male with shortness of breath EXAM: PORTABLE CHEST 1 VIEW COMPARISON:  Prior chest x-ray 04/10/2017; prior chest CT 04/11/2017 FINDINGS: Right IJ tunneled hemodialysis catheter. The catheter tip projects over the mid right atrium. Stable cardiomegaly. 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 Atherosclerotic calcifications again noted in the transverse aorta. There is a pulmonary vascular congestion bordering on mild edema. The previously noted loculated pleural fluid is no longer evident. No acute osseous abnormality. IMPRESSION: 1. Pulmonary vascular congestion with mild interstitial edema. 2. Bilateral layering pleural effusions. 3. Stable cardiomegaly. 4.  Aortic Atherosclerosis (ICD10-170.0) 5. Right IJ tunneled hemodialysis catheter in good position with the tip overlying the mid right atrium. Electronically Signed   By: Jacqulynn Cadet M.D.   On: 04/16/2017 10:53    Time Spent in minutes  30   Jani Gravel M.D on 04/21/2017 at 5:45 AM  Between 7am to 7pm - Pager - [phone]  After 7pm go to [email or web address] - password [facility]  [facility] -  Office  [phone]

## 2017-04-21 NOTE — Progress Notes (Signed)
   Introduced chaplaincy services.  Will follow, as needed.  

## 2017-04-21 NOTE — Progress Notes (Signed)
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  Labs:  Recent Labs  04/18/17 0742 04/19/17 0045 04/19/17 0729 04/19/17 2014 04/20/17 0602 04/20/17 1445 04/20/17 2356  HGB 8.7* 8.6* 8.8*  --  8.5*  --   --   HCT 26.8* 25.7* 26.4*  --  26.3*  --   --   PLT 107* 102*  --   --  92*  --   --   LABPROT 21.6* 28.1*  --  27.7* 26.6*  --   --   INR 1.85 2.57  --  2.52 2.40  --   --   HEPARINUNFRC  --  0.17*  --   --   --  0.16* 0.22*  CREATININE 5.35* 6.33*  --   --  4.83*  --   --      Assessment: 76yo male remains subtherapeutic on heparin after rate increase; hesitant to make large rate changes 2/2 low goal and was previously therapeutic at lower rate.  Goal of Therapy:  Heparin level 0.3-0.5 units/ml   Plan:  Will increase heparin gtt slightly to 1500 units/hr and check heparin level in [place], PharmD, BCPS  04/21/2017,1:26 AM

## 2017-04-21 NOTE — Progress Notes (Signed)
RT NOTE:  Pt does not wish to wear CPAP tonight. CPAP @ bedside if patient changes his mind.

## 2017-04-21 NOTE — Progress Notes (Signed)
Palliative Care -Family Meeting  Robert Stanton continues to decline, Robert LFTs are rising he is icteric, becoming more confused. He is activelyu dying. I met with Robert Stanton, Robert Stanton and Robert three sons. We discussed Robert prognosis, Robert QOL, level of suffering and that despite our very aggressive attempts to get him better Robert body is continuing to shut down. He told Robert family he knew he was dying when he came into the hospital. They are very loving and supportive of Robert situation- they understand he is approaching EOL and that it may only be days.   Summary: 1. DNR 2. Transition to FULL COMFORT CARE 3. Stop Hemodialysis 4. Discontinue all non-essential meds and those not related to Robert comfort  Prognosis: 1. Hours-days, < week most likely  Discharge Plan:  1. We discussed hospice options- patient himself was able to endorse this plan-they want [facility] in [place]. This is the closest hospice to where they live-the family needs care-one of Robert sons and Robert Stanton appear to be very ill as well and both require oxygen. They want him to not suffer-they want to be able to spend time at Robert bedside around the clock- if able they hope to be able to bring Robert Dog to visit.   Will ask CSW to send referral and check on a bed- he is ready for discharge today if they can take him-this will be best for him and Robert family.  Requested our Chaplain see them -this is a difficult time for them but they all seem to be supporting each other and making compassionate reasonable choices.  Lane Hacker, DO Palliative Medicine [phone]  Time: 70 minutes Greater than 50%  of this time was spent counseling and coordinating care related to the above assessment and plan.

## 2017-04-21 NOTE — Clinical Social Work Note (Addendum)
CSW faxed referral information to [facility].  Dayton Scrape, [place] [phone]  4:05 pm Admissions coordinator at [facility] is requesting that patient's family call her to discuss out of pocket expenses, etc. CSW gave her phone number to the patient's son, Roselyn Reef. He stated he would call her in a few minutes.  Dayton Scrape, [place] [phone]  4:42 pm Received voicemail from admissions coordinator at [facility]. She stated that the family voiced that they could not afford the copay for [facility] and were unsure of what to do. One of the nurses from the [facility] will come by the hospital tomorrow to assess the patient and determine if there are any symptoms they can manage and if there is any needed medication.  Dayton Scrape, [place]

## 2017-04-21 NOTE — Discharge Summary (Addendum)
Robert Stanton, is a 76 y.o. male  DOB [birth year]  MRN [id number].  Admission date:  04/10/2017  Admitting Physician  Bethena Roys, MD  Discharge Date:  04/21/2017   Primary MD  Unk Pinto, MD  Recommendations for primary care physician for things to follow:   Upper GI bleeding/ chronic normocytic anemia -Status post EGD 04/18/2017=> Nonbleeding Cameron erosions, most notable for moderate to severe pain and gastritis resulting in very friable gastric mucosa. Pathology => focal tubular adenoma H. Pylori negative Patient started on Carafate, continue PPI twice daily. GI felt heparin IV could be restarted however Coumadin to be held. 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 Indeterminate diastolic function -Patient was given diuresis per nephrology however patient continued to remain oliguric -Continue volume control with HD -Monitor intake and output, daily weights -Cardiology consulted and appreciated. -Patient presently not a candidate for aggressive cardiac evaluation per cardiology   Somnolence -Appears to mildly improved. Currently  on C Pap -ABG did not show CO2 retention  Right loculated pleural effusion -No signs of an infection at this time -CT chest: Right-sided calcified pleural plaque, nonspecific with differential including asbestosis related pleural disease versus pleural insult such as trauma or infection. Small bilateral pleural effusions, probably loculated bilaterally with small loculated components. Right major fissure.  Atrial fibrillation/aortic valve replacement -Patient presented with supratherapeutic INR, given vitamin K as well as fresh frozen plasma -Heparin per pharmacy has been discontinued, Coumadin discontinued -hemoglobin currently stable Pt has opted for Hospice and [facility]  Dyspnea -secondary to volume overload -cont  supplement O2 as needed  Essential hypertension complicated by ?hypotension -bp stable currently   Diabetes mellitus, type II -Hemoglobin A1c was 5.1 on 03/05/2017  Atherosclerotic thoracic aorta -4.3 cm ectatic ascending thoracic aorta noted on CT scan  Possible cirrhosis/ elevated AST -Noted on CT chest -AST improving slightly, last105 (per patient and son, no longer consumes alcohol) -Suspect elevated AST secondary to medications -Reviewed medications with pharmacy. One, Synthroid, Protonix, Lasix may all increased LFTs. CRP also increase AST. Lasix has been discontinued. Given that patient currently requires Protonix for GI bleed as well as heparin for AF, will continue to monitor LFTs closely. Typically an AST/ALT >3 indicates ETOH related liver disease   Mildly elevated troponin -Likely secondary to the above, currently chest pain-free -  Troponin elevation not consistent with ACS   NSVT -Overnight 04/15/17, had 9beat run of VT -no further issues  Hypothyroidism -STOP Synthroid  Nausea -possibly related to the start of hemodialysis -Currently denies further nausea, - Zofran  Obstructive sleep apnea -Continue CPAP QHS  Severe Protein  Calorie Malnutrition Prostat  Goals of care/Code status Palliative care discussed case with family and they have opted for [place]   Consultants Nephrology Gastroenterology Interventional radiology  Cardiology Palliative care  Procedures  EGD x 2 Right IJ HD tunneled catheter placement Echocardiogram   Admission Diagnosis  Cough [R05] Upper GI bleed [K92.2]   Discharge Diagnosis  Cough [R05] Upper GI bleed [K92.2]    Principal Problem:   GI bleed Active Problems:   Essential hypertension   Coronary atherosclerosis   ESOPHAGEAL STRICTURE   PAD,s/p aorto bifem BG, s/p Rt Fem PTA 3/15   Gout   OSA and COPD overlap syndrome (HCC)   AAA (abdominal aortic aneurysm) (HCC)   Permanent atrial fibrillation (HCC)   Type 2 DM with CKD stage 4 and hypertension ([place])   Long term current use of anticoagulant therapy   Status post mechanical aortic valve replacement   Generalized anxiety disorder   Hypothyroidism   Dizziness   Chronic kidney disease, stage V (HCC)   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 AAA (abdominal aortic aneurysm) ([place])   . Arthritis   . CAD (coronary artery disease)    a. CABG '00. b. all SVGs occluded 2010. c. low risk Nuc 2012   . Cataracts, bilateral   . CKD (chronic kidney disease), stage III   . COPD (chronic obstructive pulmonary disease) ([place])   . GERD (gastroesophageal reflux disease)   . Gout   . Hiatal hernia   . HOH (hard of hearing)   . Hyperlipidemia   . Hypertension   . Hypothyroidism   . Ischemic cardiomyopathy   . Kidney cysts   . Mechanical heart valve present    a. Hx mechanical St Jude AVR 2000.  [name] OSA (obstructive sleep apnea)    wears CPAP  . Peripheral  arterial disease ([place])    a. s/p aorto bifem BG;  b. 11/2013 PVA distal R Fem-fem stenosis;  c. 12/2013 PTA of dist R femoral bypass graft.  . Permanent atrial fibrillation ([place])   . Prediabetes   . Ventricular tachycardia (paroxysmal) ([place])    Seen by Dr. Lovena Le with EP, started on amiodarone    Past Surgical History:  Procedure Laterality Date  . AORTIC VALVE REPLACEMENT     St. Jude  . AV FISTULA PLACEMENT Left 03/04/2017   Procedure: ARTERIOVENOUS (AV) FISTULA CREATION LEFT ARM;  Surgeon: Serafina Mitchell, MD;  Location: [facility];  Service: Vascular;  Laterality: Left;  . CARDIAC CATHETERIZATION  11/25/2008   loss of 2/3 (RCA, OM) bypass grafts with patent internal mammary artery to LAD; large collateral filling of RCA ; osteal narrowing of circumflex of ~50%  . CARDIOVERSION  8/282012  . CORONARY ARTERY BYPASS GRAFT  2000   3 vessel; LIMA to LAD; RCA, OM  . ESOPHAGOGASTRODUODENOSCOPY N/A 04/11/2017   Procedure: ESOPHAGOGASTRODUODENOSCOPY (EGD);  Surgeon: Irene Shipper, MD;  Location: [facility];  Service:  Endoscopy;  Laterality: N/A;  . ESOPHAGOGASTRODUODENOSCOPY N/A 04/18/2017   Procedure: ESOPHAGOGASTRODUODENOSCOPY (EGD);  Surgeon: Milus Banister, MD;  Location[facility] ENDOSCOPY;  Service: Endoscopy;  Laterality: N/A;  . FEMORAL BYPASS    . IR FLUORO GUIDE CV LINE RIGHT  04/12/2017  . IR US GUIDE VASC ACCESS RIGHT  04/12/2017  . LOWER EXTREMITY ANGIOGRAM N/A 12/03/2013   Procedure: LOWER EXTREMITY ANGIOGRAM;  Surgeon: Lorretta Harp, MD;  Location: [facility] CATH LAB;  Service: Cardiovascular;  Laterality: N/A;  . LOWER EXTREMITY ANGIOGRAM N/A 01/07/2014   Procedure: LOWER EXTREMITY ANGIOGRAM;  Surgeon: Lorretta Harp, MD;  Location: [facility] CATH LAB;  Service: Cardiovascular;  Laterality: N/A;  . shunts in femoral arteries    . stents in kidneys         HPI  from the history and physical done on the day of admission:     76 y.o. male with medical history significant for- hypertension,  coronary artery disease- CABG- 2010,  atrial fibrillation CKD5, hypertension, esophageal strictures, gout, COPD, OSA, mechanical aortic valve, hypothyroidism, peripheral artery disease, type II DM. Patient came in with complaints of bleeding from his rectum was started about 3 days ago, he also reports dark stools- 3-4 days. He denies abdominal pain. In the ED he had an episode of vomiting- with coffee-ground emesis.  He endorses dizziness that started Saturday morning- 04/09/17. This appears to be a chronic issue for patient. Pt denies NSAID use. Patient reports a history of peptic ulcer disease diagnosed with EGD- several years ago. Last EGD 10/2012- was esophageal stricture and erosions, erosive gastritis. Last colonoscopy was about 10 years ago- reports it was normal. He also notes a chronic cough that is productive today, though he has a history of COPD. His shortness of breath is at baseline. He denies fever or chills.  ED Course: Patient was mildly tachycardic pulse of 108, blood pressure soft 102/48. O2 sats- 95-100% on room air (recording of 66% most likely spurious). Blood work revealed- hemoglobin stable at 9.4, patient's baseline, platelets 176, sodium low at 130, K3.6, creatinine elevated at 9.9 from last check 04/05/2017- 6.57. BUN marked elevation at 138. Lipase normal at 45, INR elevated at 4.34. Chest x-ray showed loculated pleural effusion. Patient also had CT abdomen and pelvis done in the ED- 80 suspected cirrhosis, moderate ascites, anasarca,  new small to moderate pleural effusions suspected loculation on the right. Patient was given IV Protonix 40 mg in the ED. FFP and IV vitamin K 10mg  was also given.     Hospital Course:     Pt was admitted found to have heme positve stool and GI consulted pt was placed on IV protonix and coumadin was discontinued and he received FFP and vitamin K.  Heparin initiated by cardiology.  Nephrology consulted for new ESRD and pt was started on HD TTS.   Pt had EGD on 7/2=>AVMs status post hemostatic endoscopy with APC,  Pt had further brbpr earlier today 7/5 and family (son and wife are present) and patient has opted for [facility] rather than proceeding w further treatment.    Follow UP  [facility] Follow up in 1 day(s).   Specialty:  Home Health Services Contact information: [place] [address] [phone]            Consults obtained - cardiology, nephrology, GI, palliative , IR  Discharge Condition: critical  Diet and Activity recommendation: See Discharge Instructions below  Discharge  Instructions         Discharge Medications     Allergies as of 04/21/2017      Reactions   Chantix [varenicline] Nausea And Vomiting   Lyrica [pregabalin] Other (See Comments)   Swelling, couldn't breathe   Nsaids Other (See Comments)   Cannot take while on coumadin   Simvastatin Other (See Comments)   Pt said he had a "bleed out" after taking it   Ziac [bisoprolol-hydrochlorothiazide] Other (See Comments)   fatigue      Medication List    STOP taking these medications   allopurinol 300 MG tablet Commonly known as:  ZYLOPRIM   amiodarone 200 MG tablet Commonly known as:  PACERONE   aspirin EC 81 MG tablet   clotrimazole-betamethasone cream Commonly known as:  LOTRISONE   fenofibrate 145 MG tablet Commonly known as:  TRICOR   isosorbide mononitrate 30 MG 24 hr tablet Commonly known as:  IMDUR   levothyroxine 50 MCG tablet Commonly known as:  SYNTHROID, LEVOTHROID   metolazone 5 MG tablet Commonly known as:  ZAROXOLYN   montelukast 10 MG tablet Commonly known as:  SINGULAIR   NITROSTAT 0.4 MG SL tablet Generic drug:  nitroGLYCERIN   pravastatin 40 MG tablet Commonly known as:  PRAVACHOL   sertraline 50 MG tablet Commonly known as:  ZOLOFT   torsemide 100 MG tablet Commonly known as:  DEMADEX   verapamil 80 MG tablet Commonly known as:  CALAN     vitamin B-12 1000 MCG tablet Commonly known as:  CYANOCOBALAMIN   Vitamin D3 5000 units Tabs   warfarin 5 MG tablet Commonly known as:  COUMADIN     TAKE these medications   albuterol (2.5 MG/3ML) 0.083% nebulizer solution Commonly known as:  PROVENTIL Take 3 mLs (2.5 mg total) by nebulization every 4 (four) hours as needed for wheezing or shortness of breath.   glycopyrrolate 1 MG tablet Commonly known as:  ROBINUL Take 1 tablet (1 mg total) by mouth every 4 (four) hours as needed (excessive secretions).   haloperidol 0.5 MG tablet Commonly known as:  HALDOL Take 1 tablet (0.5 mg total) by mouth every 4 (four) hours as needed for agitation (or delirium).   morphine 20 MG/5ML solution Take 1.3 mLs (5.2 mg total) by mouth every 2 (two) hours as needed for pain.   ondansetron 4 MG disintegrating tablet Commonly known as:  ZOFRAN-ODT Take 1 tablet (4 mg total) by mouth every 6 (six) hours as needed for nausea.       Major procedures and Radiology Reports - PLEASE review detailed and final reports for all details, in brief -      Ct Abdomen Pelvis Wo Contrast  Result Date: 04/10/2017 CLINICAL DATA:  Abdominal pain. History of renal cysts, abdominal aortic aneurysm, hiatal hernia, chronic kidney disease. EXAM: CT ABDOMEN AND PELVIS WITHOUT CONTRAST TECHNIQUE: Multidetector CT imaging of the abdomen and pelvis was performed following the standard protocol without IV contrast. COMPARISON:  CT abdomen and pelvis September 26, 2016 FINDINGS: LOWER CHEST: New small to moderate pleural effusions with suspected loculation on the RIGHT. The heart is mildly enlarged. At least mild coronary artery calcifications versus stent. HEPATOBILIARY: Dense liver seen with amiodarone use, slightly nodular contour with atrophy. Normal noncontrast CT gallbladder. PANCREAS: Normal. SPLEEN: Normal. ADRENALS/URINARY TRACT: Kidneys are orthotopic,, mildly atrophic LEFT kidney kidneys are orthotopic.  Bilateral renal cysts measuring to 5.7 cm upper pole RIGHT kidney. 2 mm RIGHT lower pole nephrolithiasis. Bilateral vascular calcifications. Kicked  No hydronephrosis; limited assessment for renal masses on this nonenhanced examination. The unopacified ureters are normal in course and caliber. Urinary bladder is partially distended and unremarkable. Stable 18 mm benign LEFT adrenal adenoma (-22 Hounsfield units). STOMACH/BOWEL: Small hiatal hernia. The stomach, small and large bowel are normal in course and caliber without inflammatory changes. Moderate sigmoid diverticulosis. The appendix is not discretely identified, however there are no inflammatory changes in the right lower quadrant. 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 Severe calcific atherosclerosis. Status post fem-fem bypass. REPRODUCTIVE: Normal. OTHER: Moderate volume ascites.  No intraperitoneal free air. MUSCULOSKELETAL: Anasarca. Multiple subcentimeter nodules anterior abdominal wall subcutaneous fat, possible granulomas. Small bilateral fat containing inguinal hernias. Surgical clips RIGHT inguinal soft tissues compatible with prior vascular access. 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  Anasarca. Nonobstructing 2 mm RIGHT lower pole, bilateral renal vascular calcifications. New small to moderate pleural effusions with suspected loculation on the RIGHT. Severe atherosclerosis. Electronically Signed   By: Elon Alas M.D.   On: 04/10/2017 22:47   Dg Chest 2 View  Result Date: 04/10/2017 CLINICAL DATA:  Nausea and lightheadedness for 3 days. History of atrial fibrillation, COPD. EXAM: CHEST  2 VIEW COMPARISON:  Chest radiograph March 28, 2017 inch chest radiograph November 27, 2015 FINDINGS: Cardiac silhouette is moderately enlarged and unchanged.  Mediastinal silhouette is nonsuspicious. Status post median sternotomy for CABG. Calcified aortic knob. Rounded RIGHT lung base pleural density increased from prior CT. Mild chronic bronchitic changes, increased lung volumes with bibasilar scarring. No pneumothorax. Osteopenia. IMPRESSION: Increasing pleural-based suspected loculated pleural effusion though, mass not excluded. Recommend CT chest with contrast. Stable cardiomegaly and COPD with bibasilar atelectasis/ scarring. Electronically Signed   By: Elon Alas M.D.   On: 04/10/2017 22:33   Dg Chest 2 View  Result Date: 03/28/2017 CLINICAL DATA:  Cough congestion and shortness of breath EXAM: CHEST  2 VIEW COMPARISON:  11/27/2015 FINDINGS: Post sternotomy changes. Small bilateral pleural effusions. Peripheral right lower lung opacity. Streaky atelectasis or mild infiltrate at the left base as well. Borderline cardiomegaly with atherosclerosis. No pneumothorax. Degenerative changes of the spine. IMPRESSION: 1. Small bilateral effusions with peripherally based opacity in the right lower lung, possibly due to infiltrate or loculated pleural fluid. Streaky atelectasis or infiltrate at the left base. Imaging follow-up to resolution is recommended. 2. Borderline cardiomegaly Electronically Signed   By: Donavan Foil M.D.   On: 03/28/2017 22:59   Ct Chest Wo Contrast  Result Date: 04/12/2017 CLINICAL DATA:  Inpatient.  Chronic productive cough. EXAM: CT CHEST WITHOUT CONTRAST TECHNIQUE: Multidetector CT imaging of the chest was performed following the standard protocol without IV contrast. COMPARISON:  Chest radiograph from one day prior. 03/02/2013 chest CT. FINDINGS: Cardiovascular: Mild cardiomegaly. No significant pericardial fluid/thickening. Aortic valvular prosthesis is in place. Left main, left anterior descending, left circumflex and right coronary atherosclerosis status post CABG. Atherosclerotic thoracic aorta with 4.3 cm ectatic ascending  thoracic aorta. Top-normal caliber main pulmonary artery (3.1 cm diameter). Mediastinum/Nodes: No discrete thyroid nodules. Unremarkable esophagus. No axillary adenopathy. Mild right paratracheal adenopathy measuring up to 1.0 cm (series 3/ image 29). No additional pathologically enlarged mediastinal or gross hilar nodes on this noncontrast scan. Lungs/Pleura: No pneumothorax. Calcified pleural plaque in the medial posterior right pleural space. No left-sided calcified pleural plaques. There are small dependent bilateral pleural effusions, probably loculated bilaterally with small loculated  component in the superior portion of the right major fissure (series 3/image 80). Mild paraseptal emphysema. Mild-to-moderate compressive atelectasis in the bilateral lower lobes. No lung masses or significant pulmonary nodules in the aerated portions of the lungs. Upper abdomen: Small hiatal hernia. Liver surface appears finely irregular, cannot exclude cirrhosis. Small volume ascites in the visualized upper abdomen. Multiple simple appearing renal cysts in the upper kidneys bilaterally, largest 5.7 cm in the posterior upper right kidney. Musculoskeletal: No aggressive appearing focal osseous lesions. Moderate thoracic spondylosis. Sternotomy wires appear intact. Moderate anasarca. IMPRESSION: 1. Right-sided calcified pleural plaque, which is nonspecific with the differential including asbestos related pleural disease (usually bilateral) versus remote pleural insult (trauma or infection). 2. Small dependent bilateral pleural effusions, probably loculated bilaterally with small loculated component in the superior right major fissure. 3. Mild cardiomegaly. 4. Possible cirrhosis.  Small volume upper abdominal ascites. 5. Moderate anasarca. 6. 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 Recommend annual imaging followup by CTA or MRA. This recommendation follows 2010 ACCF/AHA/AATS/ACR/ASA/SCA/SCAI/SIR/STS/SVM Guidelines for the  Diagnosis and Management of Patients with Thoracic Aortic Disease. Circulation. 2010; 121: L875-I433. 7. Mild mediastinal lymphadenopathy, nonspecific, probably reactive. 8. Small hiatal hernia. Aortic Atherosclerosis (ICD10-I70.0) and Emphysema (ICD10-J43.9). Electronically Signed   By: Ilona Sorrel M.D.   On: 04/12/2017 09:21   Ir Fluoro Guide Cv Line Right  Result Date: 04/12/2017 INDICATION: Acute on chronic renal failure, no current access for dialysis EXAM: ULTRASOUND GUIDANCE FOR VASCULAR ACCESS RIGHT INTERNAL JUGULAR PERMANENT HEMODIALYSIS CATHETER Date:  6/26/20186/26/2018 3:00 pm Radiologist:  M. Daryll Brod, MD Guidance:  Ultrasound and fluoroscopic FLUOROSCOPY TIME:  Fluoroscopy Time: 43 seconds (10 mGy). MEDICATIONS: Ancef 2 g administered within 1 hour of the procedure ANESTHESIA/SEDATION: Versed 0.5 mg IV; Fentanyl 12.5 mcg IV; Moderate Sedation Time:  26 minutes The patient was continuously monitored during the procedure by the interventional radiology nurse under my direct supervision. CONTRAST:  None. COMPLICATIONS: None immediate. PROCEDURE: Informed consent was obtained from the patient following explanation of the procedure, risks, benefits and alternatives. The patient understands, agrees and consents for the procedure. All questions were addressed. A time out was performed. Maximal barrier sterile technique utilized including caps, mask, sterile gowns, sterile gloves, large sterile drape, hand hygiene, and 2% chlorhexidine scrub. Under sterile conditions and local anesthesia, right internal jugular micropuncture venous access was performed with ultrasound. Images were obtained for documentation. A guide wire was inserted followed by a transitional dilator. Next, a 0.035 guidewire was advanced into the IVC with a 5-French catheter. Measurements were obtained from the right venotomy site to the proximal right atrium. In the right infraclavicular chest, a subcutaneous tunnel was created  under sterile conditions and local anesthesia. 1% lidocaine with epinephrine was utilized for this. The 23 cm tip to cuff palindrome catheter was tunneled subcutaneously to the venotomy site and inserted into the SVC/RA junction through a valved peel-away sheath. Position was confirmed with fluoroscopy. Images were obtained for documentation. Blood was aspirated from the catheter followed by saline and heparin flushes. The appropriate volume and strength of heparin was instilled in each lumen. Caps were applied. The catheter was secured at the tunnel site with Gelfoam and a pursestring suture. The venotomy site was closed with subcuticular Vicryl suture. Dermabond was applied to the small right neck incision. A dry sterile dressing was applied. The catheter is ready for use. No immediate complications. IMPRESSION: Ultrasound and fluoroscopically guided right internal jugular tunneled hemodialysis catheter (23 cm tip to cuff palindrome catheter). Electronically Signed  By: Eugenie Filler M.D.   On: 04/12/2017 15:08   Ir US Guide Vasc Access Right  Result Date: 04/12/2017 INDICATION: Acute on chronic renal failure, no current access for dialysis EXAM: ULTRASOUND GUIDANCE FOR VASCULAR ACCESS RIGHT INTERNAL JUGULAR PERMANENT HEMODIALYSIS CATHETER Date:  6/26/20186/26/2018 3:00 pm Radiologist:  M. Daryll Brod, MD Guidance:  Ultrasound and fluoroscopic FLUOROSCOPY TIME:  Fluoroscopy Time: 43 seconds (10 mGy). MEDICATIONS: Ancef 2 g administered within 1 hour of the procedure ANESTHESIA/SEDATION: Versed 0.5 mg IV; Fentanyl 12.5 mcg IV; Moderate Sedation Time:  26 minutes The patient was continuously monitored during the procedure by the interventional radiology nurse under my direct supervision. CONTRAST:  None. COMPLICATIONS: None immediate. PROCEDURE: Informed consent was obtained from the patient following explanation of the procedure, risks, benefits and alternatives. The patient understands, agrees and consents for  the procedure. All questions were addressed. A time out was performed. Maximal barrier sterile technique utilized including caps, mask, sterile gowns, sterile gloves, large sterile drape, hand hygiene, and 2% chlorhexidine scrub. Under sterile conditions and local anesthesia, right internal jugular micropuncture venous access was performed with ultrasound. Images were obtained for documentation. A guide wire was inserted followed by a transitional dilator. Next, a 0.035 guidewire was advanced into the IVC with a 5-French catheter. Measurements were obtained from the right venotomy site to the proximal right atrium. In the right infraclavicular chest, a subcutaneous tunnel was created under sterile conditions and local anesthesia. 1% lidocaine with epinephrine was utilized for this. The 23 cm tip to cuff palindrome catheter was tunneled subcutaneously to the venotomy site and inserted into the SVC/RA junction through a valved peel-away sheath. Position was confirmed with fluoroscopy. Images were obtained for documentation. Blood was aspirated from the catheter followed by saline and heparin flushes. The appropriate volume and strength of heparin was instilled in each lumen. Caps were applied. The catheter was secured at the tunnel site with Gelfoam and a pursestring suture. The venotomy site was closed with subcuticular Vicryl suture. Dermabond was applied to the small right neck incision. A dry sterile dressing was applied. The catheter is ready for use. No immediate complications. IMPRESSION: Ultrasound and fluoroscopically guided right internal jugular tunneled hemodialysis catheter (23 cm tip to cuff palindrome catheter). Electronically Signed   By: Jerilynn Mages.  Shick M.D.   On: 04/12/2017 15:08   Dg Chest Port 1 View  Result Date: 04/16/2017 CLINICAL DATA:  76 year old male with shortness of breath EXAM: PORTABLE CHEST 1 VIEW COMPARISON:  Prior chest x-ray 04/10/2017; prior chest CT 04/11/2017 FINDINGS: Right IJ  tunneled hemodialysis catheter. The catheter tip projects over the mid right atrium. Stable cardiomegaly. 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 Atherosclerotic calcifications again noted in the transverse aorta. There is a pulmonary vascular congestion bordering on mild edema. The previously noted loculated pleural fluid is no longer evident. No acute osseous abnormality. IMPRESSION: 1. Pulmonary vascular congestion with mild interstitial edema. 2. Bilateral layering pleural effusions. 3. Stable cardiomegaly. 4.  Aortic Atherosclerosis (ICD10-170.0) 5. Right IJ tunneled hemodialysis catheter in good position with the tip overlying the mid right atrium. Electronically Signed   By: Jacqulynn Cadet M.D.   On: 04/16/2017 10:53    Micro Results     Recent Results (from the past 240 hour(s))  MRSA PCR Screening     Status: None   Collection Time: 04/17/17  2:31 PM  Result Value Ref Range Status   MRSA by PCR NEGATIVE NEGATIVE Final    Comment:  The GeneXpert MRSA Assay (FDA approved for NASAL specimens only), is one component of a comprehensive MRSA colonization surveillance program. It is not intended to diagnose MRSA infection nor to guide or monitor treatment for MRSA infections.        Today   Subjective    Braxxton Stoudt today has elected to go with Hospice and Comfort Measures Only.      Objective   Blood pressure (!) 92/43, pulse 71, temperature 97.6 F (36.4 C), temperature source Oral, resp. rate 18, height 5\' 8"  (1.727 m), weight 106.2 kg (234 lb 2.1 oz), SpO2 90 %.   Intake/Output Summary (Last 24 hours) at 04/21/17 1744 Last data filed at 04/21/17 1600  Gross per 24 hour  Intake           369.17 ml  Output                1 ml  Net           368.17 ml    Exam Awake Alert, Oriented x 3, No new F.N deficits, Normal affect [place].AT,PERRAL Supple Neck,No JVD, No cervical lymphadenopathy appriciated.  Symmetrical Chest  wall movement, Good air movement bilaterally, CTAB Midline scar, irr, irr, s1, s2, No Parasternal Heave +ve B.Sounds, Abd Soft, Non tender, No organomegaly appriciated, No rebound -guarding or rigidity. No Cyanosis, Clubbing or edema, No new Rash or bruise L AVF, HD catheter in right upper chest   Data Review   CBC w Diff: Lab Results  Component Value Date   WBC 10.4 04/21/2017   HGB 9.1 (L) 04/21/2017   HCT 27.8 (L) 04/21/2017   PLT 104 (L) 04/21/2017   LYMPHOPCT 15 04/05/2017   MONOPCT 12 04/05/2017   EOSPCT 3 04/05/2017   BASOPCT 0 04/05/2017    CMP: Lab Results  Component Value Date   NA 134 (L) 04/21/2017   K 5.1 04/21/2017   CL 95 (L) 04/21/2017   CO2 23 04/21/2017   BUN 54 (H) 04/21/2017   CREATININE 6.21 (H) 04/21/2017   CREATININE 6.57 (H) 04/05/2017   PROT 7.4 04/21/2017   ALBUMIN 3.0 (L) 04/21/2017   BILITOT 9.4 (H) 04/21/2017   ALKPHOS 76 04/21/2017   AST 513 (H) 04/21/2017   ALT 117 (H) 04/21/2017  .   Total Time in preparing paper work, data evaluation and todays exam - 60 minutes  Jani Gravel M.D on 04/21/2017 at 5:44 PM  [facility]   Office  [phone]

## 2017-04-21 NOTE — Progress Notes (Signed)
OT Cancellation Note  Patient Details Name: Robert Stanton MRN: [id number] DOB: 1940/10/25   Cancelled Treatment:    Reason Eval/Treat Not Completed: Medical issues which prohibited therapy.  Pt with supratherapeutic Heparin level with active rectal bleeding and hypotensive.  Will reattempt.  Beatrix Breece [place], OTR/L [phone]   Lucille Passy M 04/21/2017, 11:28 AM

## 2017-04-21 NOTE — Progress Notes (Signed)
Patient has bright red blood coming from rectum BP 98/48 pt. Denies any lightheadedness or dizziness. Heparin gtt stopped.  Dr. Maudie Mercury paged, awaiting response.  Will continue to monitor.

## 2017-04-21 NOTE — Progress Notes (Signed)
Daily Rounding Note  04/21/2017, 10:33 AM  LOS: 11 days   SUBJECTIVE:   Chief complaint: hematochezia and hypotension. Stools yesterday were dark, looking like old blood.  This AM started passing BRB.  Does not c/o abdominal pain, confirmed this with RN.  BPs to 90s/40s, HR in 50s.  Increasing confusion and somnolence this AM.   HD initiated 6/26.  Last session: 7/3   OBJECTIVE:         Vital signs in last 24 hours:    Temp:  [97.6 F (36.4 C)-98.3 F (36.8 C)] 97.7 F (36.5 C) (07/05 0731) Pulse Rate:  [66-80] 66 (07/05 0731) Resp:  [16-19] 18 (07/05 0731) BP: (98-110)/(34-49) 98/48 (07/05 0731) SpO2:  [90 %-98 %] 96 % (07/05 0753) Last BM Date: 04/21/17 Filed Weights   04/15/17 1429 04/15/17 1811  Weight: 108 kg (238 lb 1.6 oz) 106.2 kg (234 lb 2.1 oz)   General: weak, frail, ill   Heart: RRR with soft, muffled valve snap Chest: clear bil.  No labored breathing Abdomen: soft, active BS.  Fullness (? Hepatomegaly) in RUQ, adge of this is 4 to 5 CM below costal edge. No tenderness   Extremities: no CCE or cold feet, no mottling of LE skin.   Heme:  Extensive bruising and purpura on UE. Neuro/Psych:  Difficult to arouse, not able to answer my questions, words are mumbled.  Does follow simple commands in limited way as falling back to sleep quickly.    Intake/Output from previous day: 07/04 0701 - 07/05 0700 In: 339.2 [P.O.:120; I.V.:219.2] Out: 1 [Stool:1]  Intake/Output this shift: No intake/output data recorded.  Lab Results:  Recent Labs  04/19/17 0045 04/19/17 0729 04/20/17 0602 04/21/17 0244  WBC 9.4  --  10.4 10.4  HGB 8.6* 8.8* 8.5* 9.1*  HCT 25.7* 26.4* 26.3* 27.8*  PLT 102*  --  92* 104*   BMET  Recent Labs  04/19/17 0045 04/20/17 0602 04/21/17 0244  NA 132* 133* 134*  K 4.7 4.2 5.1  CL 95* 94* 95*  CO2 25 26 23   GLUCOSE 105* 100* 94  BUN 54* 37* 54*  CREATININE 6.33* 4.83* 6.21*    CALCIUM 8.9 8.6* 8.8*   LFT  Recent Labs  04/19/17 0045 04/20/17 0602 04/21/17 0244  PROT 7.4 7.2 7.4  ALBUMIN 2.9*  3.0* 2.8* 3.0*  AST 113* 310* 513*  ALT 28 67* 117*  ALKPHOS 66 82 76  BILITOT 6.5* 8.7* 9.4*  BILIDIR 3.5*  --   --   IBILI 3.0*  --   --    PT/INR  Recent Labs  04/20/17 0602 04/21/17 0244  LABPROT 26.6* 35.7*  INR 2.40 3.46   Hepatitis Panel No results for input(s): HEPBSAG, HCVAB, HEPAIGM, HEPBIGM in the last 72 hours.  Studies/Results: No results found.   Scheduled Meds: . albuterol  2.5 mg Nebulization TID  . calcium acetate  1,334 mg Oral TID WC  . darbepoetin (ARANESP) injection - DIALYSIS  60 mcg Intravenous Q Tue-HD  . levothyroxine  50 mcg Oral QAC breakfast  . midodrine  5 mg Oral BID WC  . montelukast  10 mg Oral QHS  . multivitamin  1 tablet Oral QHS  . pantoprazole (PROTONIX) IV  40 mg Intravenous Q12H  . sucralfate  1 g Oral Q6H   Continuous Infusions: PRN Meds:.ipratropium-albuterol, ondansetron (ZOFRAN) IV   ASSESMENT:   *  UGIB: bloody emesis and black stools at admission.  04/11/17 EGD #1: bleeding AVMs covered with clot, treated with APC.  Esophageal stricture.  Moderately large HH with Cameron's erosions.  04/18/17 EGD #2: Clean-based ulcer at site of AVM ablation.  HH and non-bleeding Camerons lesions.  Moderate to severe, friable, pan-gastritis.  Pathology of stomach: focal tubular adenoma, chronic gastritis with intestinal metaplasia, no HGD   IV BID Protonix and Carafate.   Painless Hematochezia developed this AM along with hypotension.  Though no abd pain or tenderness, with the rising LFTs, wonder about intestinal hypoperfusion/ischemic colitis.  2008 Colonoscopy: left sided tics and banding of int rrhoids.  So this could be diverticular bleed.    *  Rising t bili and transaminases.  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  Suspect shock liver.  No sequela of liver disease on EGD (no varices or  portal htn).  Amiodarone discontinued at admission 6/24.    *  Anemia of chronic disease with superimposed acute blood loss anemia.  Now on weekly epo.  Received 3 doses Ferric Gluconate last week.  Hgb actually improved this AM.    *  Chronic Coumadin. Mechanical AVR 2002.  Last dose 6/30.  Heparin drip stopped today in setting of GIB. Coags rising, may be due to hepatic dysfunction.      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  S/p left AV fistula PTA.  .   *  Combined diastolic and systolic heart failure.  Echo 6/28: EF 35%. Severly dilated left ventricle.  Mildly reduced right ventricular function.      *  DNR.  Palliative care involved.  Pt has not stopped dialysis.  Overall poor prognosis per multiple specialties.      PLAN   *  Continue the IV Protonix.  At his current state, is not a candidate for colonoscopy.    *  CBC, CMET, coags, ammonia in AM.      Azucena Freed  04/21/2017, 10:33 AM Pager: [phone]  ________________________________________________________________________  Velora Heckler GI MD note:  I personally examined the patient, reviewed the data.  I agree with the plan laid out by palliative care this afternoon; full comfort measures.  I cannot help him with any medicines or procedures that I know of.    Will sign off for now, please call or page with any further questions or concerns.   Owens Loffler, MD [facility] Pager [phone]

## 2017-04-21 NOTE — Progress Notes (Signed)
Patient ID: Robert Stanton, male   DOB: [birth year], 76 y.o.   MRN: [id number] S:events of this morning noted, pt is minimally arousable but does not report pain3 O:BP (!) 98/48 (BP Location: Right Arm)   Pulse 66   Temp 97.7 F (36.5 C) (Oral)   Resp 18   Ht 5\' 8"  (1.727 m)   Wt 106.2 kg (234 lb 2.1 oz)   SpO2 96%   BMI 35.60 kg/m   Intake/Output Summary (Last 24 hours) at 04/21/17 1129 Last data filed at 04/21/17 0200  Gross per 24 hour  Intake           339.17 ml  Output                1 ml  Net           338.17 ml   Intake/Output: I/O last 3 completed shifts: In: 339.2 [P.O.:120; I.V.:219.2] Out: 2001 [Other:2000; Stool:1]  Intake/Output this shift:  No intake/output data recorded. Weight change:  Gen:ill-appearing elderly man lying in bed, pale LYY:TKPTWSFKCLE Resp:cta XNT:ZGYFVCBSW/HQPRF, +BS, mildly tender to palpation Ext: tr pretibial edema, LUE AVF +T/B, diffuse ecchymoses on arms   Recent Labs Lab 04/15/17 0525 04/16/17 0306 04/17/17 1248 04/18/17 0742 04/19/17 0045 04/20/17 0602 04/21/17 0244  NA 134* 135 135 133* 132* 133* 134*  K 3.9 4.2 4.9 4.6 4.7 4.2 5.1  CL 93* 96* 95* 94* 95* 94* 95*  CO2 25 25 25 25 25 26 23   GLUCOSE 101* 113* 115* 89 105* 100* 94  BUN 73* 43* 68* 40* 54* 37* 54*  CREATININE 8.23* 6.06* 8.22* 5.35* 6.33* 4.83* 6.21*  ALBUMIN 3.0* 3.0* 3.0* 3.1* 2.9*  3.0* 2.8* 3.0*  CALCIUM 8.5* 8.6* 9.0 8.6* 8.9 8.6* 8.8*  PHOS  --   --   --   --  7.2*  --   --   AST 105* 106* 99* 105* 113* 310* 513*  ALT 28 25 24 26 28  67* 117*   Liver Function Tests:  Recent Labs Lab 04/19/17 0045 04/20/17 0602 04/21/17 0244  AST 113* 310* 513*  ALT 28 67* 117*  ALKPHOS 66 82 76  BILITOT 6.5* 8.7* 9.4*  PROT 7.4 7.2 7.4  ALBUMIN 2.9*  3.0* 2.8* 3.0*   No results for input(s): LIPASE, AMYLASE in the last 168 hours. No results for input(s): AMMONIA in the last 168 hours. CBC:  Recent Labs Lab 04/18/17 0042 04/18/17 0742  04/19/17 0045 04/19/17 0729 04/20/17 0602 04/21/17 0244  WBC 8.8 9.2 9.4  --  10.4 10.4  HGB 8.8* 8.7* 8.6* 8.8* 8.5* 9.1*  HCT 25.8* 26.8* 25.7* 26.4* 26.3* 27.8*  MCV 97.4 100.0 99.2  --  100.8* 101.1*  PLT 100* 107* 102*  --  92* 104*   Cardiac Enzymes: No results for input(s): CKTOTAL, CKMB, CKMBINDEX, TROPONINI in the last 168 hours. CBG: No results for input(s): GLUCAP in the last 168 hours.  Iron Studies: No results for input(s): IRON, TIBC, TRANSFERRIN, FERRITIN in the last 72 hours. Studies/Results: No results found. [name] albuterol  2.5 mg Nebulization TID  . calcium acetate  1,334 mg Oral TID WC  . darbepoetin (ARANESP) injection - DIALYSIS  60 mcg Intravenous Q Tue-HD  . levothyroxine  50 mcg Oral QAC breakfast  . midodrine  5 mg Oral BID WC  . montelukast  10 mg Oral QHS  . multivitamin  1 tablet Oral QHS  . pantoprazole (PROTONIX) IV  40 mg Intravenous Q12H  . sucralfate  1 g Oral Q6H    BMET    Component Value Date/Time   NA 134 (L) 04/21/2017 0244   K 5.1 04/21/2017 0244   CL 95 (L) 04/21/2017 0244   CO2 23 04/21/2017 0244   GLUCOSE 94 04/21/2017 0244   BUN 54 (H) 04/21/2017 0244   CREATININE 6.21 (H) 04/21/2017 0244   CREATININE 6.57 (H) 04/05/2017 1554   CALCIUM 8.8 (L) 04/21/2017 0244   GFRNONAA 8 (L) 04/21/2017 0244   GFRNONAA 8 (L) 04/05/2017 1554   GFRAA 9 (L) 04/21/2017 0244   GFRAA 9 (L) 04/05/2017 1554   CBC    Component Value Date/Time   WBC 10.4 04/21/2017 0244   RBC 2.75 (L) 04/21/2017 0244   HGB 9.1 (L) 04/21/2017 0244   HCT 27.8 (L) 04/21/2017 0244   PLT 104 (L) 04/21/2017 0244   MCV 101.1 (H) 04/21/2017 0244   MCH 33.1 04/21/2017 0244   MCHC 32.7 04/21/2017 0244   RDW 26.7 (H) 04/21/2017 0244   LYMPHSABS 915 04/05/2017 1554   MONOABS 732 04/05/2017 1554   EOSABS 183 04/05/2017 1554   BASOSABS 0 04/05/2017 1554    Assessment/Plan:  1. Hematochezia and hypotension- new onset BRBPR with drop in BP's into the 90's/40's, not  feeling well with increased confusion.  GI consulted and heparin drip stopped.  Will hold off on HD today in light of this. 1. Recheck H/H and transfuse prn 2. NewESRD. Continue with HD qTTS and is set up for [facility] once stable for discharge, unfortunately he is not strong enough for outpatient dialysis at this time. He will need to be able to stand and transfer to a recliner for dialysis.  I am concerned about his overall prognosis due to his poor functional status.  He is not maiing any progress with his strength and has not been out of bed.  He is not suitable for outpatient dialysis at this time and needs to make considerable improvement before he can be discharged. 1. Appreciate palliative care assistance in addressing EOL issues and declining clinical picture. 2. Hold off on HD today in light of active rectal bleeding and hypotension 3. UGI bleed- appreciate GI input, pan gastritis 4. CHF/CMP- UF as BP tolerates 5. Anemia of CKD/ABLA- due to GIB, start aranesp with HD 6. Gastric AVMs 7. Vascular access- LUE AVF +T/B, placed 03/04/17 8. Deconditioning- per primary 9. A fib 10. S/p AVR 11. OSA on CPAP 12. Disposition- he is not ready for discharge for outpatient dialysis due to his severe deconditioning. He will need to be able to stand and transfer to recliner for dialysis before he can be discharged. I appreciate the time and effort of palliative care to help set goals/limits of care as his prognosis is grim in light of his multiple co-morbidities, ongoing GIB, poor functional and nutritional status.   Donetta Potts, MD [employer] [phone]

## 2017-04-21 NOTE — Progress Notes (Signed)
Patient to transfer to [place] report given to receiving nurse, all questions answered at this time.  Patient appropriate for discharge at this time.

## 2017-04-22 ENCOUNTER — Encounter (HOSPITAL_COMMUNITY): Payer: PPO

## 2017-04-23 ENCOUNTER — Other Ambulatory Visit: Payer: Self-pay | Admitting: Physician Assistant

## 2017-05-18 DEATH — deceased

## 2017-05-23 ENCOUNTER — Ambulatory Visit: Payer: Self-pay | Admitting: Physician Assistant

## 2017-05-24 ENCOUNTER — Ambulatory Visit: Payer: PPO | Admitting: Internal Medicine

## 2017-06-24 ENCOUNTER — Ambulatory Visit: Payer: Self-pay | Admitting: Physician Assistant

## 2017-07-06 ENCOUNTER — Encounter: Payer: Self-pay | Admitting: Internal Medicine

## 2017-09-14 IMAGING — CT CT ABD-PELV W/O CM
2 of 4 series · 16 of 46 positions shown, 18 images · non-contrast
Comparison: CT abdomen and pelvis September 26, 2016

CLINICAL DATA: Abdominal pain. History of renal cysts, abdominal
aortic aneurysm, hiatal hernia, chronic kidney disease.

EXAM:
CT ABDOMEN AND PELVIS WITHOUT CONTRAST
TECHNIQUE: Multidetector CT imaging of the abdomen and pelvis was performed
following the standard protocol without IV contrast.

[Series 3: a/p w/o 5mm · axial · non-contrast · 0.97mm/px · z∈[+361,+791]mm · 13 of 94 slices shown, 15 images]
[im 4/94  soft-tissue]
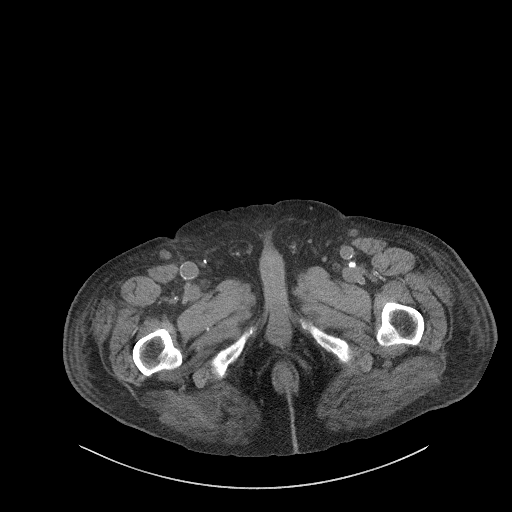
[im 4/94  bone]
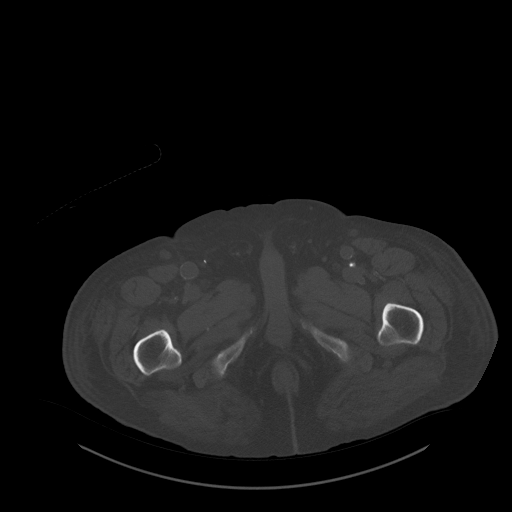
[im 12/94  soft-tissue]
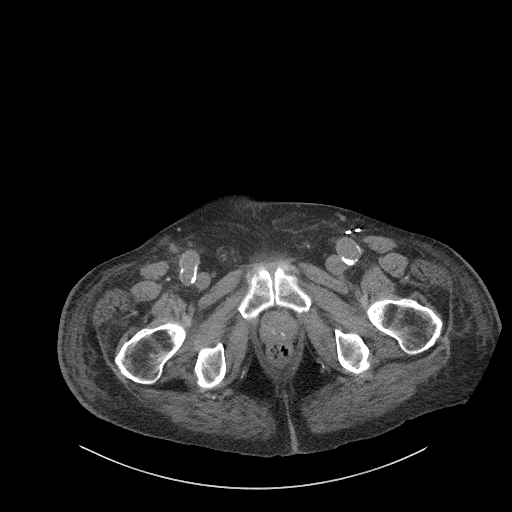
[im 19/94  soft-tissue]
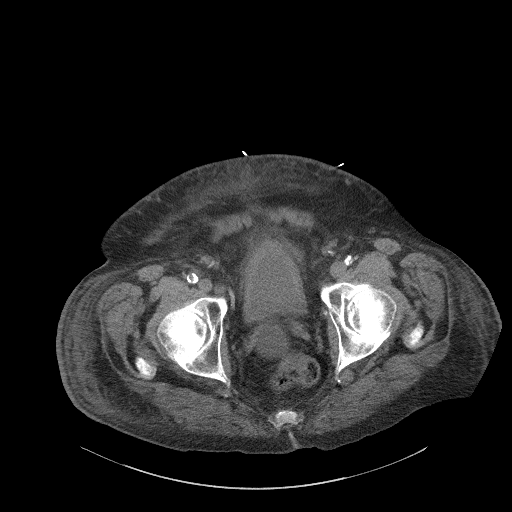
[im 27/94  soft-tissue]
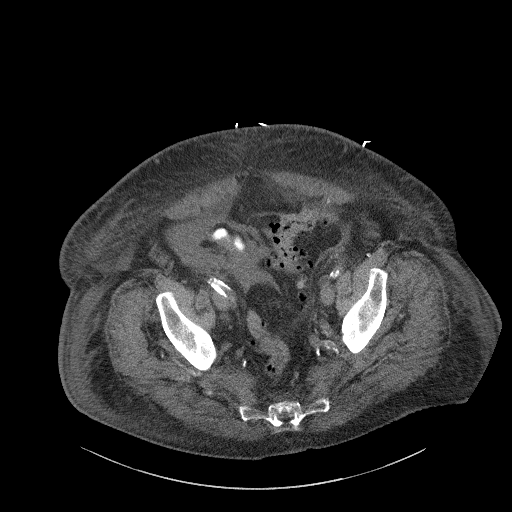
[im 34/94  soft-tissue]
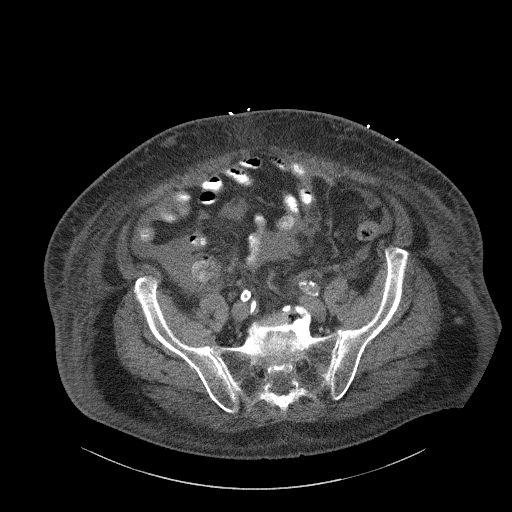
[im 41/94  soft-tissue]
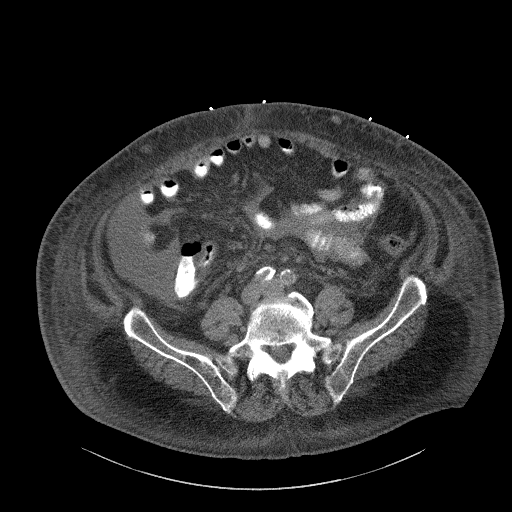
[im 49/94  soft-tissue]
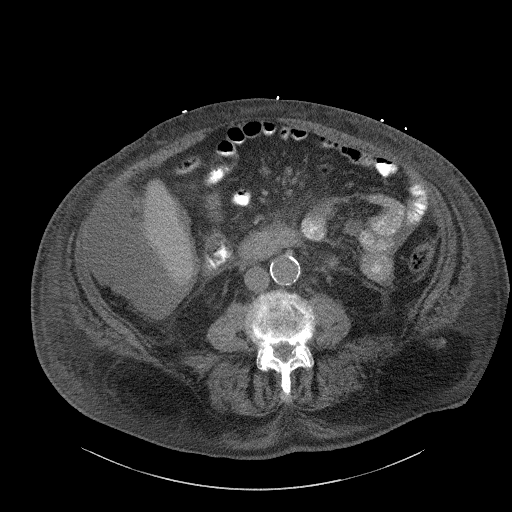
[im 53/94  soft-tissue]
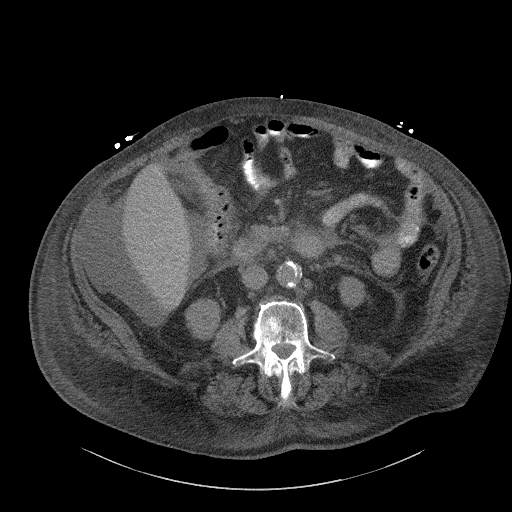
[im 60/94  soft-tissue]
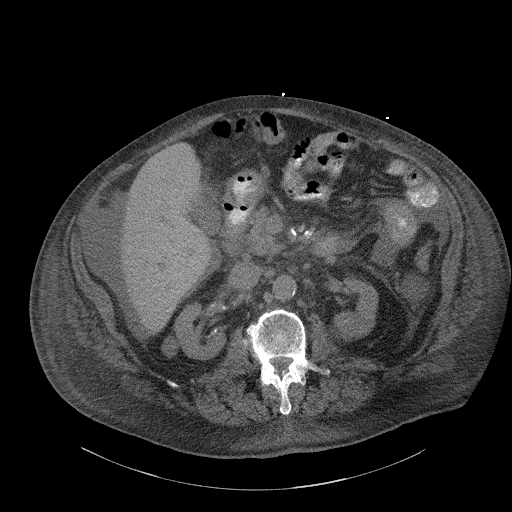
[im 60/94  bone]
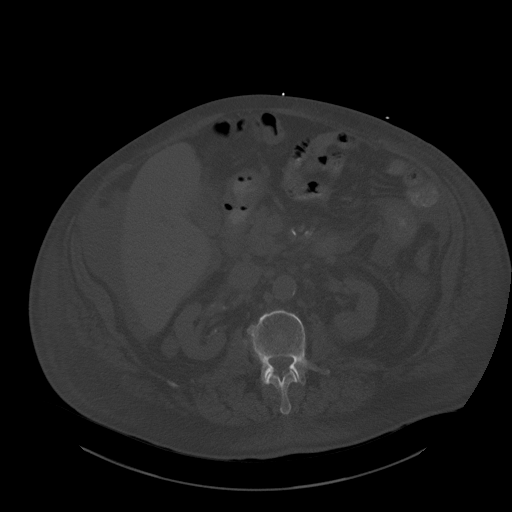
[im 67/94  soft-tissue]
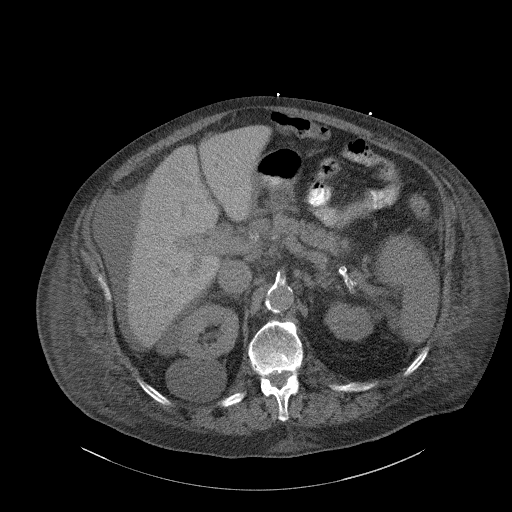
[im 75/94  soft-tissue]
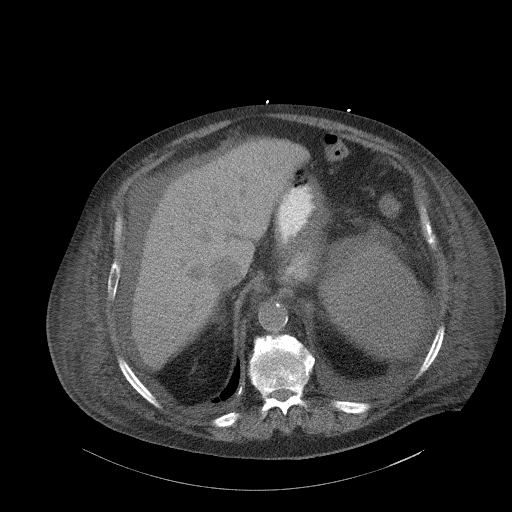
[im 82/94  soft-tissue]
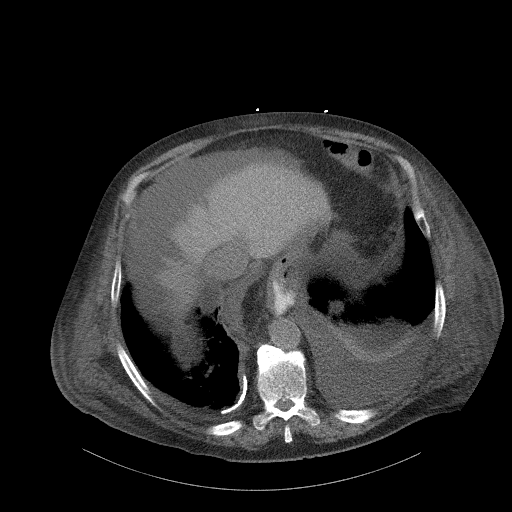
[im 90/94  soft-tissue]
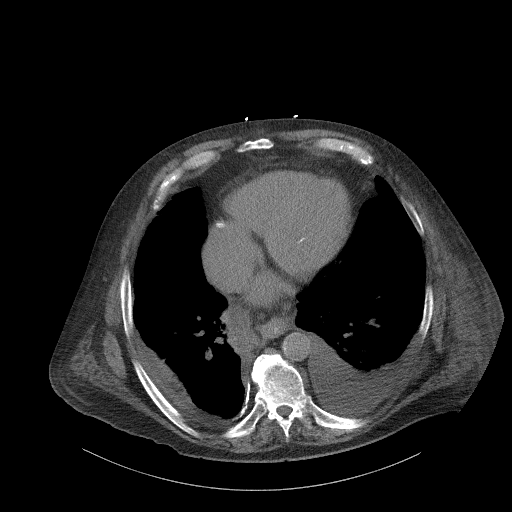

[Series 6: a/p w/o cor · coronal · non-contrast · 0.94mm/px · 3 of 168 slices shown]
[im 56/168  soft-tissue]
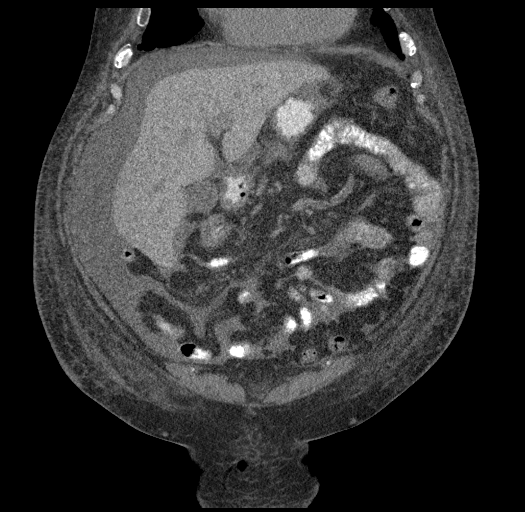
[im 75/168  soft-tissue]
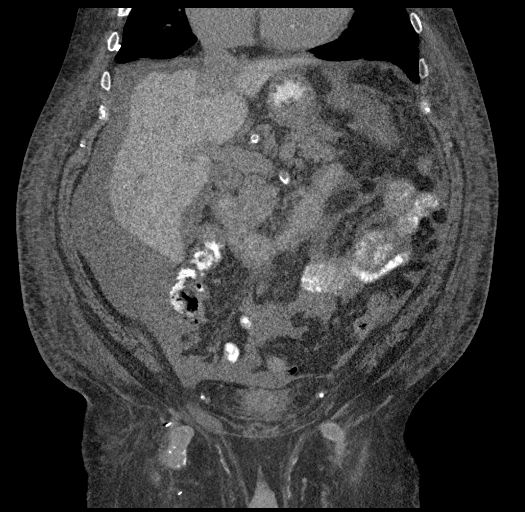
[im 93/168  soft-tissue]
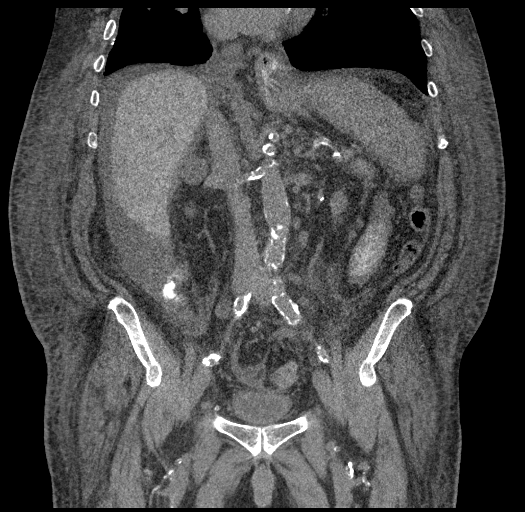

[16 of 46 positions shown; findings below may reference images not displayed]

FINDINGS: LOWER CHEST: New small to moderate pleural effusions with suspected
loculation on the RIGHT. The heart is mildly enlarged. At least mild
coronary artery calcifications versus stent.

HEPATOBILIARY: Dense liver seen with amiodarone use, slightly
nodular contour with atrophy. Normal noncontrast CT gallbladder.

PANCREAS: Normal.

SPLEEN: Normal.

ADRENALS/URINARY TRACT: Kidneys are orthotopic,, mildly atrophic
LEFT kidney kidneys are orthotopic. Bilateral renal cysts measuring
to 5.7 cm upper pole RIGHT kidney. 2 mm RIGHT lower pole
nephrolithiasis. Bilateral vascular calcifications. Kicked No
hydronephrosis; limited assessment for renal masses on this
nonenhanced examination. The unopacified ureters are normal in
course and caliber. Urinary bladder is partially distended and
unremarkable. Stable 18 mm benign LEFT adrenal adenoma (-22
Hounsfield units).

STOMACH/BOWEL: Small hiatal hernia. The stomach, small and large
bowel are normal in course and caliber without inflammatory changes.
Moderate sigmoid diverticulosis. The appendix is not discretely
identified, however there are no inflammatory changes in the right
lower quadrant.

VASCULAR/LYMPHATIC: 2.8 cm ectatic infrarenal aorta, Old infrarenal
aorta heavily calcified dissection. Severe calcific atherosclerosis.
Status post fem-fem bypass.

REPRODUCTIVE: Normal.

OTHER: Moderate volume ascites.  No intraperitoneal free air.

MUSCULOSKELETAL: Anasarca. Multiple subcentimeter nodules anterior
abdominal wall subcutaneous fat, possible granulomas. Small
bilateral fat containing inguinal hernias. Surgical clips RIGHT
inguinal soft tissues compatible with prior vascular access.
Moderate to severe degenerative change of the lumbar spine
superimposed on congenital canal narrowing.
IMPRESSION: Early suspected cirrhosis with moderate ascites.  Anasarca.

Nonobstructing 2 mm RIGHT lower pole, bilateral renal vascular
calcifications.

New small to moderate pleural effusions with suspected loculation on
the RIGHT.

Severe atherosclerosis.

## 2017-09-15 IMAGING — CT CT CHEST W/O CM
2 of 3 series · 14 of 36 positions shown, 17 images · non-contrast
Comparison: Chest radiograph from one day prior. 03/02/2013 chest
CT.

CLINICAL DATA: Inpatient.  Chronic productive cough.

EXAM:
CT CHEST WITHOUT CONTRAST
TECHNIQUE: Multidetector CT imaging of the chest was performed following the
standard protocol without IV contrast.

[Series 3: chest w/o 2mm st · axial · non-contrast · 0.87mm/px · z∈[-660,-368]mm · 11 of 172 slices shown, 14 images]
[im 13/172  mediastinal]
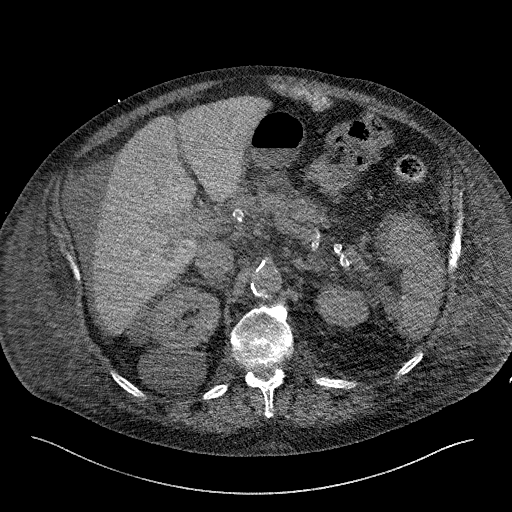
[im 13/172  lung]
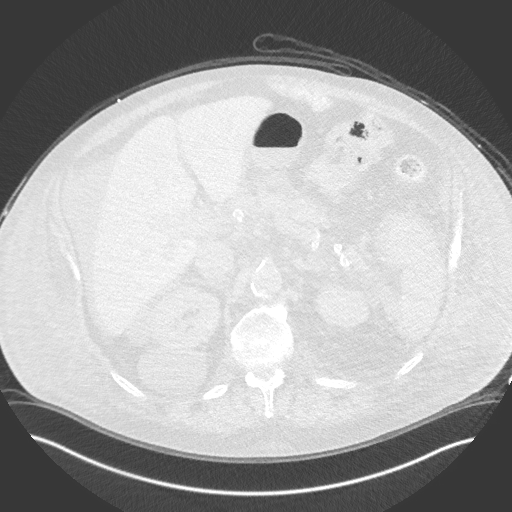
[im 26/172  lung]
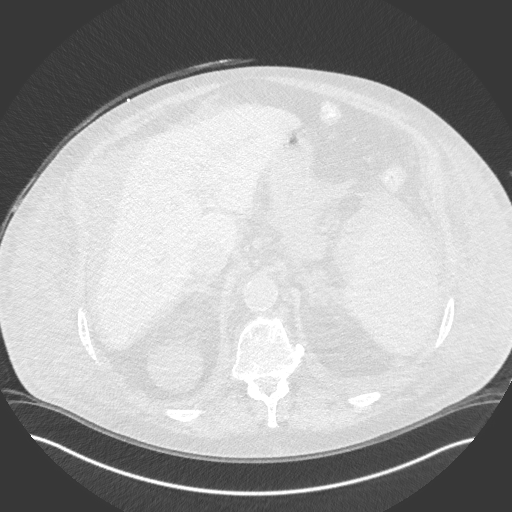
[im 39/172  lung]
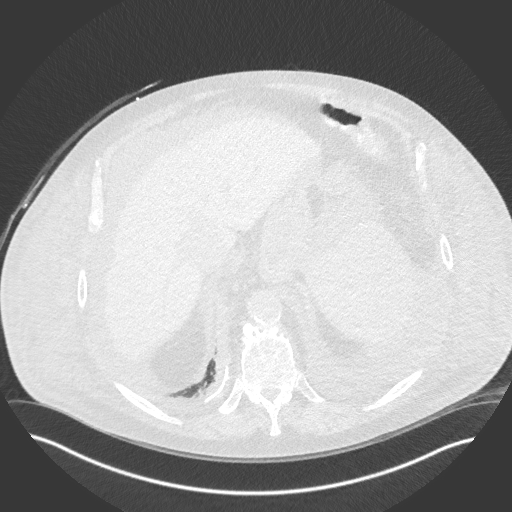
[im 58/172  lung]
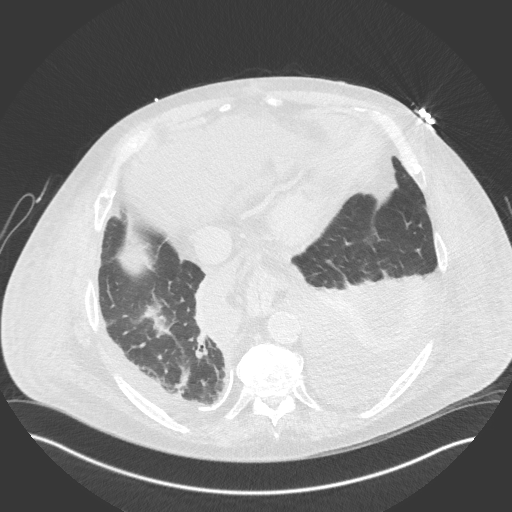
[im 70/172  mediastinal]
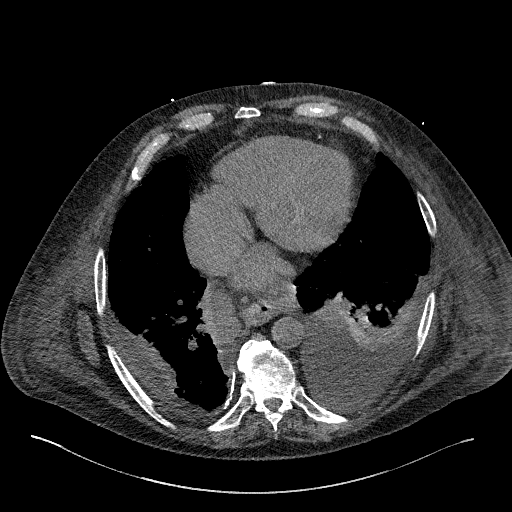
[im 70/172  lung]
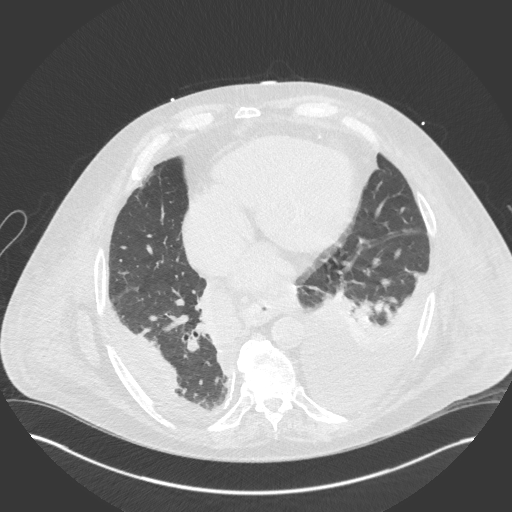
[im 89/172  lung]
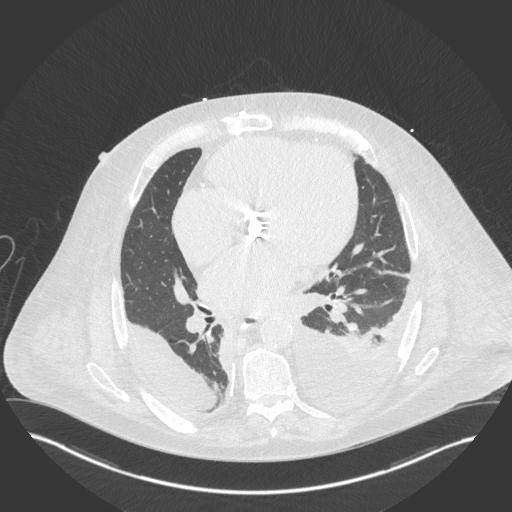
[im 102/172  lung]
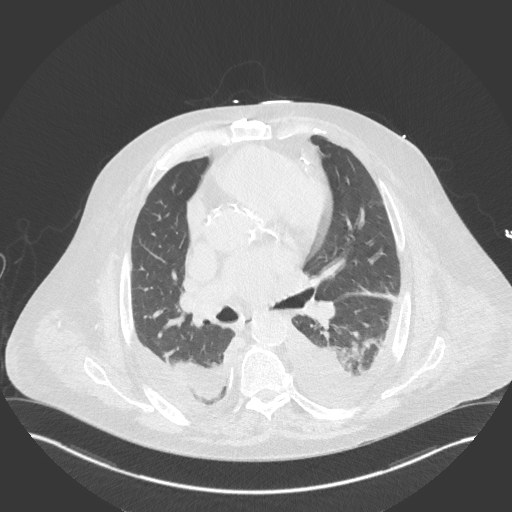
[im 115/172  lung]
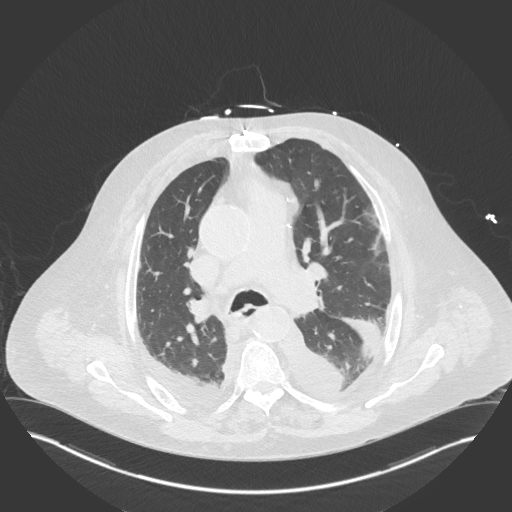
[im 134/172  mediastinal]
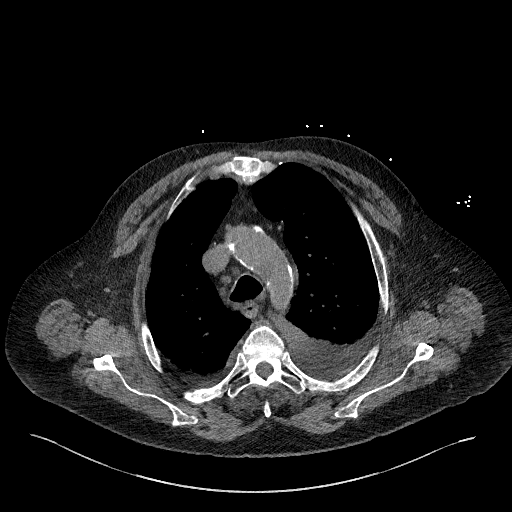
[im 134/172  lung]
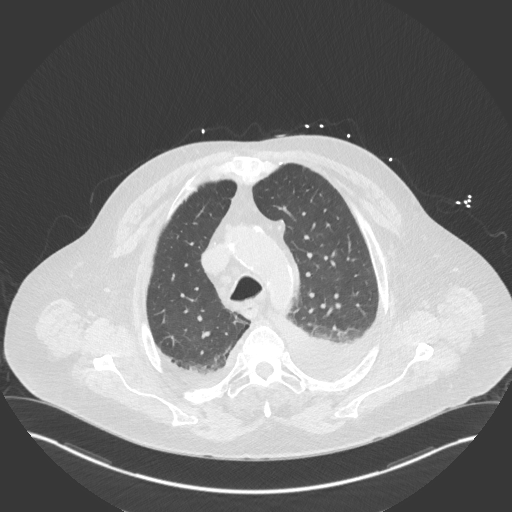
[im 146/172  lung]
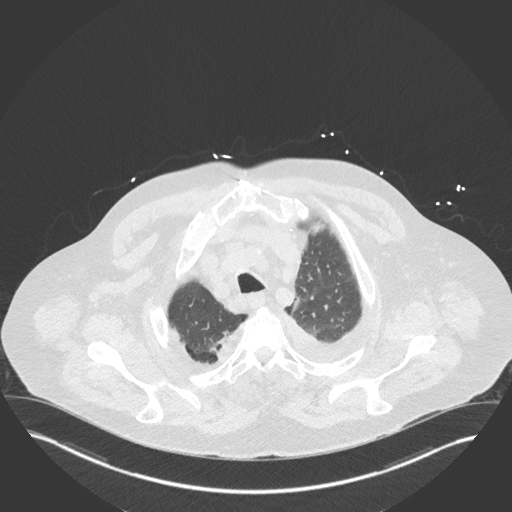
[im 159/172  lung]
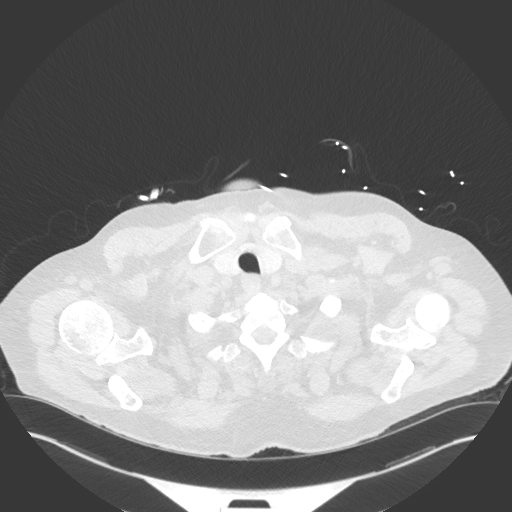

[Series 4: chest w/o 3mm st cor · coronal · non-contrast · 0.67mm/px · 3 of 109 slices shown]
[im 22/109  lung]
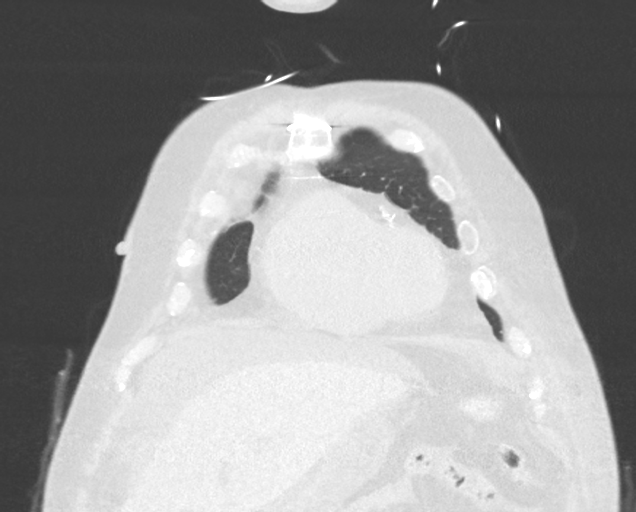
[im 44/109  lung]
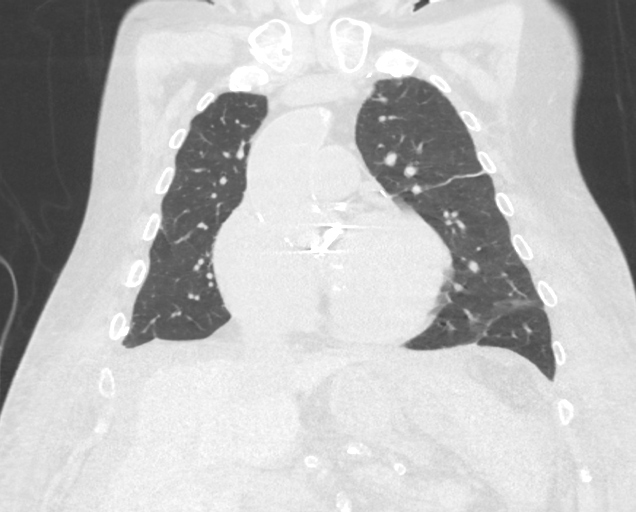
[im 65/109  lung]
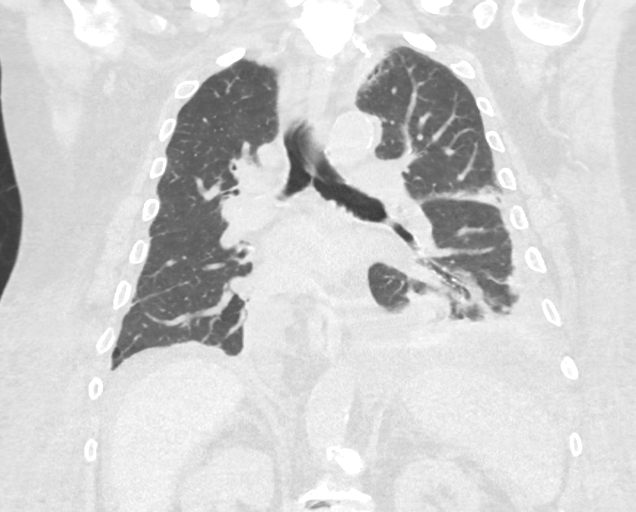

[14 of 36 positions shown; findings below may reference images not displayed]

FINDINGS: Cardiovascular: Mild cardiomegaly. No significant pericardial
fluid/thickening. Aortic valvular prosthesis is in place. Left main,
left anterior descending, left circumflex and right coronary
atherosclerosis status post CABG. Atherosclerotic thoracic aorta
with 4.3 cm ectatic ascending thoracic aorta. Top-normal caliber
main pulmonary artery (3.1 cm diameter).

Mediastinum/Nodes: No discrete thyroid nodules. Unremarkable
esophagus. No axillary adenopathy. Mild right paratracheal
adenopathy measuring up to 1.0 cm (series 3/ image 29). No
additional pathologically enlarged mediastinal or gross hilar nodes
on this noncontrast scan.

Lungs/Pleura: No pneumothorax. Calcified pleural plaque in the
medial posterior right pleural space. No left-sided calcified
pleural plaques. There are small dependent bilateral pleural
effusions, probably loculated bilaterally with small loculated
component in the superior portion of the right major fissure (series
3/image 80). Mild paraseptal emphysema. Mild-to-moderate compressive
atelectasis in the bilateral lower lobes. No lung masses or
significant pulmonary nodules in the aerated portions of the lungs.

Upper abdomen: Small hiatal hernia. Liver surface appears finely
irregular, cannot exclude cirrhosis. Small volume ascites in the
visualized upper abdomen. Multiple simple appearing renal cysts in
the upper kidneys bilaterally, largest 5.7 cm in the posterior upper
right kidney.

Musculoskeletal: No aggressive appearing focal osseous lesions.
Moderate thoracic spondylosis. Sternotomy wires appear intact.
Moderate anasarca.
IMPRESSION: 1. Right-sided calcified pleural plaque, which is nonspecific with
the differential including asbestos related pleural disease (usually
bilateral) versus remote pleural insult (trauma or infection).
2. Small dependent bilateral pleural effusions, probably loculated
bilaterally with small loculated component in the superior right
major fissure.
3. Mild cardiomegaly.
4. Possible cirrhosis.  Small volume upper abdominal ascites.
5. Moderate anasarca.
6. Ectatic 4.3 cm ascending thoracic aorta. Recommend annual imaging
followup by CTA or MRA. This recommendation follows 4898
ACCF/AHA/AATS/ACR/ASA/SCA/HEOK/SIMO WAC/REST/ROHNER Guidelines for the
Diagnosis and Management of Patients with Thoracic Aortic Disease.
Circulation. 4898; 121: [id number].
7. Mild mediastinal lymphadenopathy, nonspecific, probably reactive.
8. Small hiatal hernia.

Aortic Atherosclerosis (ET996-0NR.R) and Emphysema (ET996-W3R.Q).
